# Patient Record
Sex: Female | Born: 1940 | Race: White | Hispanic: No | Smoking: Former smoker
Health system: Southern US, Community
[De-identification: ages and names within clinical notes are randomized; demographics above are authoritative.]

## PROBLEM LIST (undated history)

## (undated) DIAGNOSIS — G629 Polyneuropathy, unspecified: Secondary | ICD-10-CM

## (undated) DIAGNOSIS — F419 Anxiety disorder, unspecified: Secondary | ICD-10-CM

## (undated) DIAGNOSIS — F32A Depression, unspecified: Secondary | ICD-10-CM

## (undated) DIAGNOSIS — N189 Chronic kidney disease, unspecified: Secondary | ICD-10-CM

## (undated) DIAGNOSIS — K5090 Crohn's disease, unspecified, without complications: Secondary | ICD-10-CM

## (undated) DIAGNOSIS — C801 Malignant (primary) neoplasm, unspecified: Secondary | ICD-10-CM

## (undated) DIAGNOSIS — M1990 Unspecified osteoarthritis, unspecified site: Secondary | ICD-10-CM

## (undated) DIAGNOSIS — F329 Major depressive disorder, single episode, unspecified: Secondary | ICD-10-CM

## (undated) DIAGNOSIS — E785 Hyperlipidemia, unspecified: Secondary | ICD-10-CM

## (undated) DIAGNOSIS — K219 Gastro-esophageal reflux disease without esophagitis: Secondary | ICD-10-CM

## (undated) DIAGNOSIS — I83893 Varicose veins of bilateral lower extremities with other complications: Secondary | ICD-10-CM

## (undated) DIAGNOSIS — I350 Nonrheumatic aortic (valve) stenosis: Secondary | ICD-10-CM

## (undated) DIAGNOSIS — I10 Essential (primary) hypertension: Secondary | ICD-10-CM

## (undated) HISTORY — DX: Polyneuropathy, unspecified: G62.9

## (undated) HISTORY — DX: Hyperlipidemia, unspecified: E78.5

## (undated) HISTORY — PX: OTHER SURGICAL HISTORY: SHX169

## (undated) HISTORY — DX: Nonrheumatic aortic (valve) stenosis: I35.0

## (undated) HISTORY — PX: BOWEL RESECTION: SHX1257

## (undated) HISTORY — DX: Essential (primary) hypertension: I10

## (undated) HISTORY — DX: Chronic kidney disease, unspecified: N18.9

## (undated) HISTORY — PX: EYE SURGERY: SHX253

---

## 1968-02-25 HISTORY — PX: BOWEL RESECTION: SHX1257

## 2001-09-21 ENCOUNTER — Ambulatory Visit (HOSPITAL_COMMUNITY): Admission: RE | Admit: 2001-09-21 | Discharge: 2001-09-21 | Payer: Self-pay | Admitting: Ophthalmology

## 2001-11-02 ENCOUNTER — Ambulatory Visit (HOSPITAL_COMMUNITY): Admission: RE | Admit: 2001-11-02 | Discharge: 2001-11-02 | Payer: Self-pay | Admitting: Ophthalmology

## 2010-03-18 ENCOUNTER — Encounter: Payer: Self-pay | Admitting: Pulmonary Disease

## 2010-08-06 ENCOUNTER — Other Ambulatory Visit (HOSPITAL_COMMUNITY): Payer: Self-pay | Admitting: Pulmonary Disease

## 2010-08-06 DIAGNOSIS — M25562 Pain in left knee: Secondary | ICD-10-CM

## 2010-08-09 ENCOUNTER — Ambulatory Visit (HOSPITAL_COMMUNITY)
Admission: RE | Admit: 2010-08-09 | Discharge: 2010-08-09 | Disposition: A | Discharge status: Home / Self Care | Payer: Medicare Other | Source: Ambulatory Visit | Attending: Pulmonary Disease | Admitting: Pulmonary Disease

## 2010-08-09 DIAGNOSIS — M23349 Other meniscus derangements, anterior horn of lateral meniscus, unspecified knee: Secondary | ICD-10-CM | POA: Insufficient documentation

## 2010-08-09 DIAGNOSIS — M25562 Pain in left knee: Secondary | ICD-10-CM

## 2010-08-09 DIAGNOSIS — M25569 Pain in unspecified knee: Secondary | ICD-10-CM | POA: Insufficient documentation

## 2011-07-16 ENCOUNTER — Other Ambulatory Visit (HOSPITAL_COMMUNITY): Payer: Self-pay | Admitting: Pulmonary Disease

## 2011-07-16 DIAGNOSIS — Z139 Encounter for screening, unspecified: Secondary | ICD-10-CM

## 2011-07-22 ENCOUNTER — Ambulatory Visit (HOSPITAL_COMMUNITY)
Admission: RE | Admit: 2011-07-22 | Discharge: 2011-07-22 | Disposition: A | Discharge status: Home / Self Care | Payer: Medicare Other | Source: Ambulatory Visit | Attending: Pulmonary Disease | Admitting: Pulmonary Disease

## 2011-07-22 DIAGNOSIS — Z139 Encounter for screening, unspecified: Secondary | ICD-10-CM

## 2011-07-22 DIAGNOSIS — Z1231 Encounter for screening mammogram for malignant neoplasm of breast: Secondary | ICD-10-CM | POA: Insufficient documentation

## 2012-07-13 ENCOUNTER — Other Ambulatory Visit (HOSPITAL_COMMUNITY): Payer: Self-pay | Admitting: Pulmonary Disease

## 2012-07-13 DIAGNOSIS — M7989 Other specified soft tissue disorders: Secondary | ICD-10-CM

## 2012-07-15 ENCOUNTER — Ambulatory Visit (HOSPITAL_COMMUNITY)
Admission: RE | Admit: 2012-07-15 | Discharge: 2012-07-15 | Disposition: A | Discharge status: Home / Self Care | Payer: Medicare Other | Source: Ambulatory Visit | Attending: Pulmonary Disease | Admitting: Pulmonary Disease

## 2012-07-15 ENCOUNTER — Other Ambulatory Visit (HOSPITAL_COMMUNITY): Payer: Self-pay | Admitting: Pulmonary Disease

## 2012-07-15 DIAGNOSIS — M7989 Other specified soft tissue disorders: Secondary | ICD-10-CM | POA: Insufficient documentation

## 2013-07-05 ENCOUNTER — Other Ambulatory Visit (HOSPITAL_COMMUNITY): Payer: Self-pay | Admitting: Pulmonary Disease

## 2013-07-05 DIAGNOSIS — Z1231 Encounter for screening mammogram for malignant neoplasm of breast: Secondary | ICD-10-CM

## 2013-07-28 ENCOUNTER — Ambulatory Visit (HOSPITAL_COMMUNITY): Payer: Medicare Other

## 2013-08-02 ENCOUNTER — Ambulatory Visit (HOSPITAL_COMMUNITY): Payer: Medicare Other

## 2013-08-04 ENCOUNTER — Ambulatory Visit (HOSPITAL_COMMUNITY)
Admission: RE | Admit: 2013-08-04 | Discharge: 2013-08-04 | Disposition: A | Discharge status: Home / Self Care | Payer: Medicare Other | Source: Ambulatory Visit | Attending: Pulmonary Disease | Admitting: Pulmonary Disease

## 2013-08-04 DIAGNOSIS — Z1231 Encounter for screening mammogram for malignant neoplasm of breast: Secondary | ICD-10-CM

## 2013-08-16 ENCOUNTER — Other Ambulatory Visit (HOSPITAL_COMMUNITY): Payer: Self-pay | Admitting: Pulmonary Disease

## 2013-08-16 DIAGNOSIS — Z780 Asymptomatic menopausal state: Secondary | ICD-10-CM

## 2013-08-23 ENCOUNTER — Ambulatory Visit (HOSPITAL_COMMUNITY)
Admission: RE | Admit: 2013-08-23 | Discharge: 2013-08-23 | Disposition: A | Discharge status: Home / Self Care | Payer: Medicare Other | Source: Ambulatory Visit | Attending: Pulmonary Disease | Admitting: Pulmonary Disease

## 2013-08-23 DIAGNOSIS — Z780 Asymptomatic menopausal state: Secondary | ICD-10-CM | POA: Insufficient documentation

## 2013-11-03 ENCOUNTER — Telehealth: Payer: Self-pay | Admitting: Orthopedic Surgery

## 2013-11-03 ENCOUNTER — Ambulatory Visit (INDEPENDENT_AMBULATORY_CARE_PROVIDER_SITE_OTHER): Payer: Medicare Other | Admitting: Orthopedic Surgery

## 2013-11-03 VITALS — BP 161/86 | Ht 60.0 in | Wt 225.0 lb

## 2013-11-03 DIAGNOSIS — S92302A Fracture of unspecified metatarsal bone(s), left foot, initial encounter for closed fracture: Secondary | ICD-10-CM

## 2013-11-03 DIAGNOSIS — S92309A Fracture of unspecified metatarsal bone(s), unspecified foot, initial encounter for closed fracture: Secondary | ICD-10-CM

## 2013-11-03 NOTE — Telephone Encounter (Signed)
PATIENT ADVISED TO COME BY OFFICE TO GET ONE

## 2013-11-03 NOTE — Patient Instructions (Signed)
Short cam walker for 6 weeks  Wear when walking only

## 2013-11-03 NOTE — Telephone Encounter (Signed)
Patient called after leaving office with her brace for left foot fracture; relays that brace is sliding down.  Asking if she can get a "pump" for it?  Please advise.  Her ph# is [phone].

## 2013-11-05 DIAGNOSIS — S92309A Fracture of unspecified metatarsal bone(s), unspecified foot, initial encounter for closed fracture: Secondary | ICD-10-CM | POA: Insufficient documentation

## 2013-11-05 NOTE — Progress Notes (Signed)
New patient  Consult from Dr. Ladona Ridgel  Regular physician Dr. Juanetta Gosling.  Pharmacy [facility].  Problem pain left foot  Duration 10/26/2013 Injured yes September 2 Symptoms pain, swelling, stiffness, numbness tingling left foot Pain described as burning aching and radiating Severity 7/10 Previous testing x-rays at the chiropractor's office read as possible hairline fracture fourth metatarsal neck. Her medication Tylenol arthritis patient has difficulty taking oral analgesics such as narcotics  Improved with soaking and ice and Tylenol worse with ambulation for too long.  Symptoms review of systems review fatigue abdominal pain diarrhea loss of bladder control frequent urination temperature intolerance depression anxiety ankle swelling numbness tingling burning pain legs weakness food allergy joint pain limping stiff, swollen joints back pain  Medical history hypertension depression arthritis osteopenia Crohn's disease  Surgical history rectal surgery times 05/12/1968, 1971, 1972.  Medications A. sulfazine 500 Xanax 0.5 doxepin 50 multivitamin, vitamin C, vitamin D, super B complex and vitamin E  Allergies none  Family history diabetes pneumonia stroke depression thyroid disease arthritis cancer  Social history does not smoke drink or use street drugs   Vital signs are stable as recorded  General appearance is normal, body habitus obese BP 161/86  Ht 5' (1.524 m)  Wt 225 lb (102.059 kg)  BMI 43.94 kg/m2   The patient is alert and oriented x 3  The patient's mood and affect are normal  Gait assessment: Severe difficulty with ambulation regarding the left foot pain and decreased time in stance phase  The cardiovascular exam reveals normal pulses and temperature without edema or  swelling.  The lymphatic system is negative for palpable lymph nodes  The sensory exam is normal.  There are no pathologic reflexes.  Balance is normal.   Exam of the left foot is  swollen and tender diffusely no specific point tenderness although the fourth metatarsal seems more tender than the others. Foot deformities none. Range of motion at the ankle joint normal. Ankle stability normal. Muscle tone normal. No atrophy. Skin is red from inflammation but no cellulitis. A/P The x-ray is questionable to me the neck area may show a fracture but clinically she has enough pain to treat  Recommend short Cam Walker for 6 weeks

## 2013-12-15 ENCOUNTER — Ambulatory Visit (INDEPENDENT_AMBULATORY_CARE_PROVIDER_SITE_OTHER): Payer: Medicare Other

## 2013-12-15 ENCOUNTER — Ambulatory Visit (INDEPENDENT_AMBULATORY_CARE_PROVIDER_SITE_OTHER): Payer: Self-pay | Admitting: Orthopedic Surgery

## 2013-12-15 VITALS — BP 132/68 | Ht 60.0 in | Wt 225.0 lb

## 2013-12-15 DIAGNOSIS — S92902D Unspecified fracture of left foot, subsequent encounter for fracture with routine healing: Secondary | ICD-10-CM

## 2013-12-15 DIAGNOSIS — S92909A Unspecified fracture of unspecified foot, initial encounter for closed fracture: Secondary | ICD-10-CM | POA: Insufficient documentation

## 2013-12-15 NOTE — Progress Notes (Signed)
Chief Complaint  Patient presents with  . Follow-up    6 week recheck + xray Left foot fracture, DOI 10/26/13   Subjective:     Barbara Lucero is a 73 y.o. female presents for repeat examination and x-ray related to a left foot metatarsal fracture of the fourth metatarsal neck. Previous x-ray was questionable for fracture but patient had not tenderness and pain but I thought treatment was necessary,  We initiated short Cam Walker and she says her foot does not hurt while she is walking in a Cam Walker   Objective:    General:   alert and cooperative  Gait:    cane necessary for ambulation   Left Foot Circulation:   warm, well perfused distal to the injury  Skin:   normal                  Tenderness:    Point tenderness to the distal aspect of the fourth and fifth metatarsal aspect of the foot   Imaging X-rays were done in the office left foot 3 views, my interpretation is it is not definitive whether or not there is a fracture of the fourth metatarsal neck, I do see abnormality there however Assessment:     injury to the fourth metatarsal neck Plan:  Continue boot 2 more weeks then is okay to safely remove the boot    Medications A. sulfazine 500 Xanax 0.5 doxepin 50 multivitamin, vitamin C, vitamin D, super B complex and vitamin E

## 2014-08-09 ENCOUNTER — Emergency Department (HOSPITAL_COMMUNITY): Payer: Medicare Other

## 2014-08-09 ENCOUNTER — Emergency Department (HOSPITAL_COMMUNITY)
Admission: EM | Admit: 2014-08-09 | Discharge: 2014-08-09 | Disposition: A | Discharge status: Home / Self Care | Payer: Medicare Other | Attending: Emergency Medicine | Admitting: Emergency Medicine

## 2014-08-09 ENCOUNTER — Encounter (HOSPITAL_COMMUNITY): Payer: Self-pay | Admitting: Emergency Medicine

## 2014-08-09 DIAGNOSIS — Y9301 Activity, walking, marching and hiking: Secondary | ICD-10-CM | POA: Insufficient documentation

## 2014-08-09 DIAGNOSIS — K5090 Crohn's disease, unspecified, without complications: Secondary | ICD-10-CM | POA: Diagnosis not present

## 2014-08-09 DIAGNOSIS — S8991XA Unspecified injury of right lower leg, initial encounter: Secondary | ICD-10-CM | POA: Diagnosis not present

## 2014-08-09 DIAGNOSIS — Y998 Other external cause status: Secondary | ICD-10-CM | POA: Insufficient documentation

## 2014-08-09 DIAGNOSIS — S8392XA Sprain of unspecified site of left knee, initial encounter: Secondary | ICD-10-CM | POA: Diagnosis not present

## 2014-08-09 DIAGNOSIS — S86912A Strain of unspecified muscle(s) and tendon(s) at lower leg level, left leg, initial encounter: Secondary | ICD-10-CM | POA: Insufficient documentation

## 2014-08-09 DIAGNOSIS — Y9289 Other specified places as the place of occurrence of the external cause: Secondary | ICD-10-CM | POA: Diagnosis not present

## 2014-08-09 DIAGNOSIS — F419 Anxiety disorder, unspecified: Secondary | ICD-10-CM | POA: Insufficient documentation

## 2014-08-09 DIAGNOSIS — X58XXXA Exposure to other specified factors, initial encounter: Secondary | ICD-10-CM | POA: Insufficient documentation

## 2014-08-09 DIAGNOSIS — M1990 Unspecified osteoarthritis, unspecified site: Secondary | ICD-10-CM | POA: Diagnosis not present

## 2014-08-09 DIAGNOSIS — S8992XA Unspecified injury of left lower leg, initial encounter: Secondary | ICD-10-CM | POA: Diagnosis present

## 2014-08-09 DIAGNOSIS — M1711 Unilateral primary osteoarthritis, right knee: Secondary | ICD-10-CM | POA: Diagnosis not present

## 2014-08-09 DIAGNOSIS — Z79899 Other long term (current) drug therapy: Secondary | ICD-10-CM | POA: Diagnosis not present

## 2014-08-09 DIAGNOSIS — F329 Major depressive disorder, single episode, unspecified: Secondary | ICD-10-CM | POA: Insufficient documentation

## 2014-08-09 HISTORY — DX: Unspecified osteoarthritis, unspecified site: M19.90

## 2014-08-09 HISTORY — DX: Crohn's disease, unspecified, without complications: K50.90

## 2014-08-09 HISTORY — DX: Major depressive disorder, single episode, unspecified: F32.9

## 2014-08-09 HISTORY — DX: Depression, unspecified: F32.A

## 2014-08-09 HISTORY — DX: Anxiety disorder, unspecified: F41.9

## 2014-08-09 MED ORDER — METHYLPREDNISOLONE SODIUM SUCC 125 MG IJ SOLR
125.0000 mg | Freq: Once | INTRAMUSCULAR | Status: AC
  Administered 2014-08-09: 125 mg via INTRAMUSCULAR
  Filled 2014-08-09: qty 2

## 2014-08-09 MED ORDER — HYDROCODONE-ACETAMINOPHEN 5-325 MG PO TABS: 1.0000 | ORAL_TABLET | Freq: Four times a day (QID) | ORAL | Status: DC | PRN

## 2014-08-09 NOTE — ED Provider Notes (Signed)
CSN: [id number]     Arrival date & time 08/09/14  1734 History   First MD Initiated Contact with Patient 08/09/14 1749     Chief Complaint  Patient presents with  . Leg Pain     (Consider location/radiation/quality/duration/timing/severity/associated sxs/prior Treatment) Patient is a 74 y.o. female presenting with leg pain. The history is provided by the patient (pt was walking and had severe pain behind her right knee).  Leg Pain Location:  Leg Injury: yes   Mechanism of injury comment:  Walking Leg location:  R leg Pain details:    Severity:  Moderate   Onset quality:  Sudden Associated symptoms: no back pain and no fatigue     Past Medical History  Diagnosis Date  . Arthritis   . Anxiety   . Crohn disease   . Depression    Past Surgical History  Procedure Laterality Date  . Bowel resection     History reviewed. No pertinent family history. History  Substance Use Topics  . Smoking status: Never Smoker   . Smokeless tobacco: Not on file  . Alcohol Use: No   OB History    Gravida Para Term Preterm AB TAB SAB Ectopic Multiple Living            2     Review of Systems  Constitutional: Negative for appetite change and fatigue.  HENT: Negative for congestion, ear discharge and sinus pressure.   Eyes: Negative for discharge.  Respiratory: Negative for cough.   Cardiovascular: Negative for chest pain.  Gastrointestinal: Negative for abdominal pain and diarrhea.  Genitourinary: Negative for frequency and hematuria.  Musculoskeletal: Negative for back pain.       Leg pain  Skin: Negative for rash.  Neurological: Negative for seizures and headaches.  Psychiatric/Behavioral: Negative for hallucinations.      Allergies  Review of patient's allergies indicates no known allergies.  Home Medications   Prior to Admission medications   Medication Sig Start Date End Date Taking? Authorizing Provider  ALPRAZolam Prudy Feeler) 0.5 MG tablet Take 0.5 mg by mouth 4 (four)  times daily as needed for anxiety.   Yes Historical Provider, MD  Ascorbic Acid (VITAMIN C) 1000 MG tablet Take 1,000 mg by mouth daily.   Yes Historical Provider, MD  b complex vitamins tablet Take 1 tablet by mouth daily.   Yes Historical Provider, MD  Cholecalciferol (VITAMIN D) 2000 UNITS tablet Take 2,000 Units by mouth daily.   Yes Historical Provider, MD  doxepin (SINEQUAN) 50 MG capsule Take 50 mg by mouth at bedtime.    Yes Historical Provider, MD  Multiple Vitamin (MULTIVITAMIN) tablet Take 1 tablet by mouth daily.   Yes Historical Provider, MD  sulfaSALAzine (AZULFIDINE) 500 MG tablet Take 500 mg by mouth 4 (four) times daily.   Yes Historical Provider, MD  vitamin E 400 UNIT capsule Take 400 Units by mouth daily.   Yes Historical Provider, MD  HYDROcodone-acetaminophen (NORCO/VICODIN) 5-325 MG per tablet Take 1 tablet by mouth every 6 (six) hours as needed. 08/09/14   [name], MD   BP 137/58 mmHg  Pulse 73  Temp(Src) 97.5 F (36.4 C) (Oral)  Resp 16  Ht  (1.676 m)  Wt 210 lb (95.255 kg)  BMI 33.91 kg/m2  SpO2 92% Physical Exam  Constitutional: She is oriented to person, place, and time. She appears well-developed.  HENT:  Head: Normocephalic.  Eyes: Conjunctivae and EOM are normal. No scleral icterus.  Neck: Neck supple. No thyromegaly present.  Cardiovascular: Normal rate and regular rhythm.  Exam reveals no gallop and no friction rub.   No murmur heard. Pulmonary/Chest: No stridor. She has no wheezes. She has no rales. She exhibits no tenderness.  Abdominal: She exhibits no distension. There is no tenderness. There is no rebound.  Musculoskeletal: She exhibits no edema.  Tender post.  Right knee  Lymphadenopathy:    She has no cervical adenopathy.  Neurological: She is oriented to person, place, and time. She exhibits normal muscle tone. Coordination normal.  Skin: No rash noted. No erythema.  Psychiatric: She has a normal mood and affect. Her behavior is  normal.    ED Course  Procedures (including critical care time) Labs Review Labs Reviewed - No data to display  Imaging Review Dg Knee Complete 4 Views Right  08/09/2014   CLINICAL DATA:  Felt pop at the posterior aspect of the right knee while walking. Worsening lower extremity edema. Initial encounter.  EXAM: RIGHT KNEE - COMPLETE 4+ VIEW  COMPARISON:  None.  FINDINGS: There is no evidence of fracture or dislocation. The joint spaces are grossly preserved. Small marginal osteophytes are seen arising at all three compartments, and wall osteophytes and tibial spine osteophytes are also seen.  No significant joint effusion is seen. The visualized soft tissues are normal in appearance.  IMPRESSION: 1. No evidence of fracture or dislocation. 2. 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   Electronically Signed   By: Roanna Raider M.D.   On: 08/09/2014 18:32     EKG Interpretation None      MDM   Final diagnoses:  Knee sprain and strain, left, initial encounter    X-ray neg.  tx with vicodin,  Ace,  Rest and walker to ambulate    [name], MD 08/09/14 [phone]

## 2014-08-09 NOTE — Discharge Instructions (Signed)
Use a walker to ambulate.  Keep leg elevated.   Follow up with dr. Romeo Apple next week

## 2014-08-09 NOTE — ED Notes (Signed)
PT stated she was walking across the floor and felt a pop behind her right knee and c/o pain behind the knee radiating into her calf with peripheral edema bilaterally worsening the past week.

## 2014-08-21 ENCOUNTER — Encounter: Payer: Self-pay | Admitting: Orthopedic Surgery

## 2014-08-21 ENCOUNTER — Ambulatory Visit (INDEPENDENT_AMBULATORY_CARE_PROVIDER_SITE_OTHER): Payer: Medicare Other | Admitting: Orthopedic Surgery

## 2014-08-21 VITALS — BP 154/77 | Ht 60.0 in | Wt 210.0 lb

## 2014-08-21 DIAGNOSIS — M660 Rupture of popliteal cyst: Secondary | ICD-10-CM

## 2014-08-21 NOTE — Progress Notes (Signed)
Patient ID: Barbara Lucero, female   DOB: 1941-02-17, 74 y.o.   MRN: [id number]  Patient ID: Barbara Lucero, female   DOB: [birth year], 74 y.o.   MRN: [id number] New   Chief Complaint  Patient presents with  . Knee Pain    ER follow up from [facility] on right knee pain.     Barbara Lucero is a 74 y.o. female.   HPI 74 years old female walking no trauma felt something pop in the back of the knee then had acute pain went to the ER x-rays were negative except for arthritis. She was started rupture ligament. She was placed in an Ace wrap given an injection of cortisone. She had multiple cortisone injections before for knee pain and that seemed to do the trick. Comes in today improved with better range of motion and no mechanical symptoms  System review pertinent related range of motion in the joint is improved she has no warmth or swelling in the joint and no mechanical symptoms at this time Review of Systems See hpi  Past Medical History  Diagnosis Date  . Arthritis   . Anxiety   . Crohn disease   . Depression     Past Surgical History  Procedure Laterality Date  . Bowel resection      No family history on file.  Social History History  Substance Use Topics  . Smoking status: Never Smoker   . Smokeless tobacco: Not on file  . Alcohol Use: No    No Known Allergies  Current Outpatient Prescriptions  Medication Sig Dispense Refill  . ALPRAZolam (XANAX) 0.5 MG tablet Take 0.5 mg by mouth 4 (four) times daily as needed for anxiety.    . Ascorbic Acid (VITAMIN C) 1000 MG tablet Take 1,000 mg by mouth daily.    Marland Kitchen b complex vitamins tablet Take 1 tablet by mouth daily.    . Cholecalciferol (VITAMIN D) 2000 UNITS tablet Take 2,000 Units by mouth daily.    Marland Kitchen doxepin (SINEQUAN) 50 MG capsule Take 50 mg by mouth at bedtime.     Marland Kitchen HYDROcodone-acetaminophen (NORCO/VICODIN) 5-325 MG per tablet Take 1 tablet by mouth every 6 (six) hours as needed. 20 tablet 0  . Multiple Vitamin  (MULTIVITAMIN) tablet Take 1 tablet by mouth daily.    Marland Kitchen sulfaSALAzine (AZULFIDINE) 500 MG tablet Take 500 mg by mouth 4 (four) times daily.    . vitamin E 400 UNIT capsule Take 400 Units by mouth daily.     No current facility-administered medications for this visit.       Physical Exam Blood pressure 154/77, height 5' (1.524 m), weight 210 lb (95.255 kg). Physical Exam The patient is well developed well nourished and well groomed. Orientation to person place and time is normal  Mood is pleasant. Ambulatory status walking with a cane Ace bandage Mild tenderness medial lateral joint knee range of motion 125 flexionand tenderness except in the posterior aspect of the popliteal fossa and the knee joint. Knee is stable motor exam is normal neurovascular exam intact skin shows venous stasis changes bilaterally  X-rays  Data Reviewed X-rays x-rays show arthritis of the knee  Assessment Encounter Diagnosis  Name Primary?  . Ruptured Bakers cyst Yes    Plan Continue weightbearing as tolerated use ice as needed okay for her to come back here for injections if needed.

## 2014-09-25 DIAGNOSIS — M79606 Pain in leg, unspecified: Secondary | ICD-10-CM | POA: Diagnosis not present

## 2014-09-25 DIAGNOSIS — F419 Anxiety disorder, unspecified: Secondary | ICD-10-CM | POA: Diagnosis not present

## 2014-09-25 DIAGNOSIS — Z0000 Encounter for general adult medical examination without abnormal findings: Secondary | ICD-10-CM | POA: Diagnosis not present

## 2014-09-25 DIAGNOSIS — K5090 Crohn's disease, unspecified, without complications: Secondary | ICD-10-CM | POA: Diagnosis not present

## 2014-09-25 DIAGNOSIS — F329 Major depressive disorder, single episode, unspecified: Secondary | ICD-10-CM | POA: Diagnosis not present

## 2015-04-23 ENCOUNTER — Ambulatory Visit (INDEPENDENT_AMBULATORY_CARE_PROVIDER_SITE_OTHER): Payer: Medicare Other | Admitting: Obstetrics & Gynecology

## 2015-04-23 ENCOUNTER — Encounter: Payer: Self-pay | Admitting: Obstetrics & Gynecology

## 2015-04-23 VITALS — BP 160/90 | HR 76 | Wt 212.0 lb

## 2015-04-23 DIAGNOSIS — N813 Complete uterovaginal prolapse: Secondary | ICD-10-CM

## 2015-04-23 NOTE — Progress Notes (Signed)
Patient ID: Barbara Lucero, female   DOB: Nov 05, 1940, 75 y.o.   MRN: [id number]      Chief Complaint  Patient presents with  . new gyn visit    bladder drop    Blood pressure 160/90, pulse 76, weight 212 lb (96.163 kg).  75 y.o. No obstetric history on file. No LMP recorded. Patient is postmenopausal. The current method of family planning is post menopausal status.  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 gabapentin (NEURONTIN) 300 MG capsule    Sig: Take 300 mg by mouth 3 (three) times daily.    Labs or Scans Ordered: No orders of the defined types were placed in this encounter.    Management::   Follow up  4 days follow up         Face to face time:   minutes  Greater than 50% of the visit time was spent in counseling and coordination of care with the patient.  The summary and outline of the counseling and care coordination is summarized in the note above.   All questions were answered.  Past Medical History  Diagnosis Date  . Arthritis   . Anxiety   . Crohn disease (HCC)   . Depression     Past Surgical History  Procedure Laterality Date  . Bowel resection      OB History    Gravida Para Term Preterm AB TAB SAB Ectopic Multiple Living            2      No Known Allergies  Social History   Social History  . Marital Status: Married    Spouse Name: N/A  . Number of Children: N/A  . Years of  Education: N/A   Social History Main Topics  . Smoking status: Never Smoker   . Smokeless tobacco: None  . Alcohol Use: No  . Drug Use: No  . Sexual Activity: Not Asked   Other Topics Concern  . None   Social History Narrative    History reviewed. No pertinent family history.

## 2015-04-27 ENCOUNTER — Encounter: Payer: Self-pay | Admitting: Obstetrics & Gynecology

## 2015-04-27 ENCOUNTER — Ambulatory Visit (INDEPENDENT_AMBULATORY_CARE_PROVIDER_SITE_OTHER): Payer: Medicare Other | Admitting: Obstetrics & Gynecology

## 2015-04-27 VITALS — BP 140/90 | HR 74 | Wt 213.4 lb

## 2015-04-27 DIAGNOSIS — N813 Complete uterovaginal prolapse: Secondary | ICD-10-CM

## 2015-04-27 NOTE — Progress Notes (Signed)
Patient ID: Barbara Lucero, female   DOB: [birth year], 75 y.o.   MRN: [id number] Patient ID: Barbara Lucero, female   DOB: 21-Mar-1940, 75 y.o.   MRN: [id number]    Chief Complaint  Patient presents with  . gyn visit    insert pessary    Blood pressure 140/90, pulse 74, weight 213 lb 6.4 oz (96.798 kg).  Barbara Lucero presents today for routine follow up related to her pessary.   She was fit for Milex ring with support #5 She reports no vaginal discharge or vaginal bleeding.  Exam reveals no undue vaginal mucosal pressure of breakdown, no discharge and no vaginal bleeding.  The pessary is placed without difficulty.    Barbara Lucero will be sen back in 1 months for continued follow up.  Lazaro Arms, MD  04/27/2015 12:00 PM        Chief Complaint  Patient presents with  . gyn visit    insert pessary    Blood pressure 140/90, pulse 74, weight 213 lb 6.4 oz (96.798 kg).  75 y.o. No obstetric history on file. No LMP recorded. Patient is postmenopausal. The current method of family planning is post menopausal status.  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    Labs or Scans Ordered: No orders of the defined types were placed in this encounter.    Management::   Follow up  4 days follow up         Face to face time:   minutes  Greater than 50% of the visit time was spent in counseling and  coordination of care with the patient.  The summary and outline of the counseling and care coordination is summarized in the note above.   All questions were answered.  Past Medical History  Diagnosis Date  . Arthritis   . Anxiety   . Crohn disease (HCC)   . Depression     Past Surgical History  Procedure Laterality Date  . Bowel resection      OB History    Gravida Para Term Preterm AB TAB SAB Ectopic Multiple Living            2      No Known Allergies  Social History   Social History  . Marital Status: Married    Spouse Name: N/A  . Number of Children: N/A  . Years of Education: N/A   Social History Main Topics  . Smoking status: Never Smoker   . Smokeless tobacco: None  . Alcohol Use: No  . Drug Use: No  . Sexual Activity: Not Asked   Other Topics Concern  . None   Social History Narrative    History reviewed. No pertinent family history.

## 2015-05-25 ENCOUNTER — Ambulatory Visit: Payer: Self-pay | Admitting: Obstetrics & Gynecology

## 2015-05-31 ENCOUNTER — Ambulatory Visit: Payer: Self-pay | Admitting: Obstetrics & Gynecology

## 2015-06-05 ENCOUNTER — Ambulatory Visit: Payer: Self-pay | Admitting: Obstetrics & Gynecology

## 2015-06-11 ENCOUNTER — Encounter: Payer: Self-pay | Admitting: Obstetrics & Gynecology

## 2015-06-11 ENCOUNTER — Ambulatory Visit (INDEPENDENT_AMBULATORY_CARE_PROVIDER_SITE_OTHER): Payer: Medicare Other | Admitting: Obstetrics & Gynecology

## 2015-06-11 VITALS — BP 140/80 | HR 76 | Wt 219.0 lb

## 2015-06-11 DIAGNOSIS — N813 Complete uterovaginal prolapse: Secondary | ICD-10-CM

## 2015-06-11 NOTE — Progress Notes (Signed)
Patient ID: Barbara Lucero, female   DOB: [birth year], 75 y.o.   MRN: [id number] Chief Complaint  Patient presents with  . Follow-up    check/ clean pessary    Blood pressure 140/80, pulse 76, weight 219 lb (99.338 kg).  [name] presents today for routine follow up related to her pessary. She had complete procidentia   She uses a mile ring with support #5 She reports no vaginal discharge or vaginal bleeding.  Exam reveals no undue vaginal mucosal pressure of breakdown, no discharge and no vaginal bleeding.  The pessary is removed, cleaned and replaced without difficulty.    VERNELLE FLUET will be sen back in 3 months for continued follow up.  Lazaro Arms, MD  06/11/2015 3:59 PM

## 2015-08-08 ENCOUNTER — Other Ambulatory Visit (HOSPITAL_COMMUNITY): Payer: Self-pay | Admitting: Pulmonary Disease

## 2015-08-08 DIAGNOSIS — Z1231 Encounter for screening mammogram for malignant neoplasm of breast: Secondary | ICD-10-CM

## 2015-08-15 ENCOUNTER — Ambulatory Visit (HOSPITAL_COMMUNITY)
Admission: RE | Admit: 2015-08-15 | Discharge: 2015-08-15 | Disposition: A | Discharge status: Home / Self Care | Payer: Medicare Other | Source: Ambulatory Visit | Attending: Pulmonary Disease | Admitting: Pulmonary Disease

## 2015-08-15 DIAGNOSIS — Z1231 Encounter for screening mammogram for malignant neoplasm of breast: Secondary | ICD-10-CM

## 2015-09-10 ENCOUNTER — Ambulatory Visit: Payer: Self-pay | Admitting: Obstetrics & Gynecology

## 2015-09-17 ENCOUNTER — Encounter: Payer: Self-pay | Admitting: Obstetrics & Gynecology

## 2015-09-17 ENCOUNTER — Ambulatory Visit (INDEPENDENT_AMBULATORY_CARE_PROVIDER_SITE_OTHER): Payer: Medicare Other | Admitting: Obstetrics & Gynecology

## 2015-09-17 VITALS — BP 140/82 | HR 90 | Ht 66.0 in | Wt 213.0 lb

## 2015-09-17 DIAGNOSIS — N813 Complete uterovaginal prolapse: Secondary | ICD-10-CM

## 2015-09-17 NOTE — Progress Notes (Signed)
Chief Complaint  Patient presents with  . Pessary Check    Blood pressure 140/82, pulse 90, height 5\' 6"  (1.676 m), weight 213 lb (96.6 kg).  Barbara Lucero presents today for routine follow up related to her pessary.    She uses a milex ring with support #5, originally placed 04/27/2015.  She reports no vaginal discharge or vaginal bleeding.  Exam reveals no undue vaginal mucosal pressure of breakdown, no discharge and no vaginal bleeding.  The pessary is removed, cleaned and replaced without difficulty.    [name] will be sen back in 4 months for continued follow up.  Lazaro Arms, MD  09/17/2015 3:48 PM

## 2015-11-05 NOTE — Addendum Note (Signed)
Encounter addended by: Salena Saner on: 11/05/2015 11:02 AM<BR>    Actions taken: Imaging Exam begun

## 2016-01-21 ENCOUNTER — Ambulatory Visit: Payer: Self-pay | Admitting: Obstetrics & Gynecology

## 2016-02-07 ENCOUNTER — Ambulatory Visit: Payer: Self-pay | Admitting: Obstetrics & Gynecology

## 2016-02-12 ENCOUNTER — Ambulatory Visit (INDEPENDENT_AMBULATORY_CARE_PROVIDER_SITE_OTHER): Payer: Medicare Other | Admitting: Obstetrics & Gynecology

## 2016-02-12 ENCOUNTER — Encounter: Payer: Self-pay | Admitting: Obstetrics & Gynecology

## 2016-02-12 VITALS — BP 178/90 | HR 78 | Wt 219.0 lb

## 2016-02-12 DIAGNOSIS — R030 Elevated blood-pressure reading, without diagnosis of hypertension: Secondary | ICD-10-CM | POA: Diagnosis not present

## 2016-02-12 DIAGNOSIS — N813 Complete uterovaginal prolapse: Secondary | ICD-10-CM

## 2016-02-12 NOTE — Progress Notes (Signed)
Chief Complaint  Patient presents with  . Pessary Check    clean    Blood pressure (!) 178/90, pulse 78, weight 219 lb (99.3 kg).  [name] presents today for routine follow up related to her pessary.    She uses a milex ring with support #5, originally placed 04/27/2015.  She reports no vaginal discharge or vaginal bleeding.  Exam reveals no undue vaginal mucosal pressure of breakdown, no discharge and no vaginal bleeding.  The pessary is removed, cleaned and replaced without difficulty.    [name] will be sen back in 4 months for continued follow up.  Lazaro ArmsEURE,LUTHER H, MD  02/12/2016 4:48 PM

## 2016-06-05 ENCOUNTER — Inpatient Hospital Stay (HOSPITAL_COMMUNITY)
Admission: EM | Admit: 2016-06-05 | Discharge: 2016-06-09 | DRG: 470 | Disposition: A | Discharge status: Skilled Nursing Facility | Payer: Medicare Other | Attending: Pulmonary Disease | Admitting: Pulmonary Disease

## 2016-06-05 ENCOUNTER — Inpatient Hospital Stay (HOSPITAL_COMMUNITY): Payer: Medicare Other

## 2016-06-05 ENCOUNTER — Emergency Department (HOSPITAL_COMMUNITY): Payer: Medicare Other

## 2016-06-05 ENCOUNTER — Encounter (HOSPITAL_COMMUNITY): Payer: Self-pay

## 2016-06-05 DIAGNOSIS — S4992XA Unspecified injury of left shoulder and upper arm, initial encounter: Secondary | ICD-10-CM | POA: Diagnosis not present

## 2016-06-05 DIAGNOSIS — W010XXA Fall on same level from slipping, tripping and stumbling without subsequent striking against object, initial encounter: Secondary | ICD-10-CM | POA: Diagnosis present

## 2016-06-05 DIAGNOSIS — F32A Depression, unspecified: Secondary | ICD-10-CM | POA: Diagnosis present

## 2016-06-05 DIAGNOSIS — S99912A Unspecified injury of left ankle, initial encounter: Secondary | ICD-10-CM | POA: Diagnosis not present

## 2016-06-05 DIAGNOSIS — S72002A Fracture of unspecified part of neck of left femur, initial encounter for closed fracture: Secondary | ICD-10-CM | POA: Diagnosis not present

## 2016-06-05 DIAGNOSIS — S0990XA Unspecified injury of head, initial encounter: Secondary | ICD-10-CM | POA: Diagnosis not present

## 2016-06-05 DIAGNOSIS — Z96642 Presence of left artificial hip joint: Secondary | ICD-10-CM

## 2016-06-05 DIAGNOSIS — S299XXA Unspecified injury of thorax, initial encounter: Secondary | ICD-10-CM | POA: Diagnosis not present

## 2016-06-05 DIAGNOSIS — Z79899 Other long term (current) drug therapy: Secondary | ICD-10-CM | POA: Diagnosis not present

## 2016-06-05 DIAGNOSIS — K5090 Crohn's disease, unspecified, without complications: Secondary | ICD-10-CM | POA: Diagnosis present

## 2016-06-05 DIAGNOSIS — Y92009 Unspecified place in unspecified non-institutional (private) residence as the place of occurrence of the external cause: Secondary | ICD-10-CM

## 2016-06-05 DIAGNOSIS — R0902 Hypoxemia: Secondary | ICD-10-CM | POA: Diagnosis not present

## 2016-06-05 DIAGNOSIS — F329 Major depressive disorder, single episode, unspecified: Secondary | ICD-10-CM | POA: Diagnosis present

## 2016-06-05 DIAGNOSIS — M6281 Muscle weakness (generalized): Secondary | ICD-10-CM | POA: Diagnosis not present

## 2016-06-05 DIAGNOSIS — M25552 Pain in left hip: Secondary | ICD-10-CM | POA: Diagnosis present

## 2016-06-05 DIAGNOSIS — M1990 Unspecified osteoarthritis, unspecified site: Secondary | ICD-10-CM | POA: Diagnosis not present

## 2016-06-05 DIAGNOSIS — F411 Generalized anxiety disorder: Secondary | ICD-10-CM | POA: Diagnosis present

## 2016-06-05 DIAGNOSIS — Z6836 Body mass index (BMI) 36.0-36.9, adult: Secondary | ICD-10-CM

## 2016-06-05 DIAGNOSIS — Z8781 Personal history of (healed) traumatic fracture: Secondary | ICD-10-CM | POA: Diagnosis present

## 2016-06-05 DIAGNOSIS — Z9181 History of falling: Secondary | ICD-10-CM | POA: Diagnosis not present

## 2016-06-05 DIAGNOSIS — J9811 Atelectasis: Secondary | ICD-10-CM | POA: Diagnosis present

## 2016-06-05 DIAGNOSIS — Z0389 Encounter for observation for other suspected diseases and conditions ruled out: Secondary | ICD-10-CM | POA: Diagnosis not present

## 2016-06-05 DIAGNOSIS — R279 Unspecified lack of coordination: Secondary | ICD-10-CM | POA: Diagnosis not present

## 2016-06-05 DIAGNOSIS — R52 Pain, unspecified: Secondary | ICD-10-CM | POA: Diagnosis not present

## 2016-06-05 DIAGNOSIS — Z471 Aftercare following joint replacement surgery: Secondary | ICD-10-CM | POA: Diagnosis not present

## 2016-06-05 DIAGNOSIS — M79605 Pain in left leg: Secondary | ICD-10-CM | POA: Diagnosis not present

## 2016-06-05 DIAGNOSIS — R293 Abnormal posture: Secondary | ICD-10-CM | POA: Diagnosis not present

## 2016-06-05 DIAGNOSIS — W19XXXA Unspecified fall, initial encounter: Secondary | ICD-10-CM

## 2016-06-05 DIAGNOSIS — S8992XA Unspecified injury of left lower leg, initial encounter: Secondary | ICD-10-CM | POA: Diagnosis not present

## 2016-06-05 DIAGNOSIS — F419 Anxiety disorder, unspecified: Secondary | ICD-10-CM | POA: Diagnosis present

## 2016-06-05 DIAGNOSIS — S72091A Other fracture of head and neck of right femur, initial encounter for closed fracture: Secondary | ICD-10-CM | POA: Diagnosis not present

## 2016-06-05 LAB — CBC WITH DIFFERENTIAL/PLATELET
BASOS ABS: 0 10*3/uL (ref 0.0–0.1)
Basophils Relative: 0 %
EOS ABS: 0.1 10*3/uL (ref 0.0–0.7)
EOS PCT: 1 %
HCT: 42.1 % (ref 36.0–46.0)
HEMOGLOBIN: 13.5 g/dL (ref 12.0–15.0)
LYMPHS ABS: 0.9 10*3/uL (ref 0.7–4.0)
Lymphocytes Relative: 9 %
MCH: 29.3 pg (ref 26.0–34.0)
MCHC: 32.1 g/dL (ref 30.0–36.0)
MCV: 91.5 fL (ref 78.0–100.0)
Monocytes Absolute: 0.8 10*3/uL (ref 0.1–1.0)
Monocytes Relative: 8 %
NEUTROS PCT: 82 %
Neutro Abs: 8.9 10*3/uL — ABNORMAL HIGH (ref 1.7–7.7)
PLATELETS: 166 10*3/uL (ref 150–400)
RBC: 4.6 MIL/uL (ref 3.87–5.11)
RDW: 13.8 % (ref 11.5–15.5)
WBC: 10.7 10*3/uL — ABNORMAL HIGH (ref 4.0–10.5)

## 2016-06-05 LAB — BASIC METABOLIC PANEL
Anion gap: 6 (ref 5–15)
BUN: 13 mg/dL (ref 6–20)
CHLORIDE: 98 mmol/L — AB (ref 101–111)
CO2: 32 mmol/L (ref 22–32)
Calcium: 8.6 mg/dL — ABNORMAL LOW (ref 8.9–10.3)
Creatinine, Ser: 0.6 mg/dL (ref 0.44–1.00)
GFR calc Af Amer: >60 mL/min (ref >60)
Glucose, Bld: 166 mg/dL — ABNORMAL HIGH (ref 65–99)
POTASSIUM: 3.6 mmol/L (ref 3.5–5.1)
SODIUM: 136 mmol/L (ref 135–145)

## 2016-06-05 LAB — PREPARE RBC (CROSSMATCH)

## 2016-06-05 LAB — ABO/RH: ABO/RH(D): A NEG

## 2016-06-05 LAB — PROTIME-INR
INR: 0.99
Prothrombin Time: 13.1 seconds (ref 11.4–15.2)

## 2016-06-05 MED ORDER — FENTANYL CITRATE (PF) 100 MCG/2ML IJ SOLN
50.0000 ug | Freq: Once | INTRAMUSCULAR | Status: AC
  Administered 2016-06-05: 50 ug via INTRAVENOUS
  Filled 2016-06-05: qty 2

## 2016-06-05 MED ORDER — MORPHINE SULFATE (PF) 2 MG/ML IV SOLN
1.0000 mg | INTRAVENOUS | Status: DC | PRN
  Administered 2016-06-05 – 2016-06-09 (×14): 2 mg via INTRAVENOUS
  Filled 2016-06-05 (×15): qty 1

## 2016-06-05 MED ORDER — ONDANSETRON HCL 4 MG/2ML IJ SOLN
4.0000 mg | Freq: Once | INTRAMUSCULAR | Status: AC
  Administered 2016-06-05: 4 mg via INTRAVENOUS
  Filled 2016-06-05: qty 2

## 2016-06-05 MED ORDER — ONDANSETRON HCL 4 MG/2ML IJ SOLN
4.0000 mg | Freq: Four times a day (QID) | INTRAMUSCULAR | Status: DC | PRN
  Administered 2016-06-06: 4 mg via INTRAVENOUS

## 2016-06-05 MED ORDER — IOPAMIDOL (ISOVUE-300) INJECTION 61%
75.0000 mL | Freq: Once | INTRAVENOUS | Status: AC | PRN
  Administered 2016-06-05: 75 mL via INTRAVENOUS

## 2016-06-05 MED ORDER — ONDANSETRON HCL 4 MG PO TABS: 4.0000 mg | ORAL_TABLET | Freq: Four times a day (QID) | ORAL | Status: DC | PRN

## 2016-06-05 MED ORDER — DOXEPIN HCL 25 MG PO CAPS
50.0000 mg | ORAL_CAPSULE | Freq: Every day | ORAL | Status: DC
  Administered 2016-06-05 – 2016-06-08 (×4): 50 mg via ORAL
  Filled 2016-06-05 (×4): qty 2

## 2016-06-05 MED ORDER — FENTANYL CITRATE (PF) 100 MCG/2ML IJ SOLN
100.0000 ug | Freq: Once | INTRAMUSCULAR | Status: AC
  Administered 2016-06-05: 100 ug via INTRAVENOUS
  Filled 2016-06-05: qty 2

## 2016-06-05 MED ORDER — SULFASALAZINE 500 MG PO TABS
500.0000 mg | ORAL_TABLET | Freq: Two times a day (BID) | ORAL | Status: DC
  Administered 2016-06-05 – 2016-06-09 (×8): 500 mg via ORAL
  Filled 2016-06-05 (×13): qty 1

## 2016-06-05 MED ORDER — SODIUM CHLORIDE 0.9 % IV SOLN
INTRAVENOUS | Status: DC
  Administered 2016-06-05 – 2016-06-09 (×6): via INTRAVENOUS

## 2016-06-05 MED ORDER — SODIUM CHLORIDE 0.9 % IV SOLN: Freq: Once | INTRAVENOUS | Status: DC

## 2016-06-05 MED ORDER — ENOXAPARIN SODIUM 40 MG/0.4ML ~~LOC~~ SOLN
40.0000 mg | SUBCUTANEOUS | Status: DC
  Administered 2016-06-05: 40 mg via SUBCUTANEOUS
  Filled 2016-06-05: qty 0.4

## 2016-06-05 MED ORDER — PSYLLIUM 95 % PO PACK
1.0000 | PACK | Freq: Every day | ORAL | Status: DC
  Administered 2016-06-06 – 2016-06-08 (×3): 1 via ORAL
  Filled 2016-06-05 (×6): qty 1

## 2016-06-05 MED ORDER — ACETAMINOPHEN 325 MG PO TABS: 650.0000 mg | ORAL_TABLET | Freq: Three times a day (TID) | ORAL | Status: DC | PRN

## 2016-06-05 MED ORDER — GABAPENTIN 300 MG PO CAPS
300.0000 mg | ORAL_CAPSULE | Freq: Three times a day (TID) | ORAL | Status: DC | PRN
  Administered 2016-06-07: 300 mg via ORAL
  Filled 2016-06-05: qty 1

## 2016-06-05 NOTE — H&P (Addendum)
History and Physical    Barbara Lucero XHB:[id number] DOB: [birth year] DOA: 06/05/2016  PCP: Fredirick Maudlin, MD   I have personally briefly reviewed patient's old medical records in [facility]  Chief Complaint: hip pain.   HPI: Barbara Lucero is a 76 y.o. female with medical history significant of arthritis and crohn's disease presents with a fall, mechanical fall. and was found to have a hip fracture on the left. She reports pain in the left hip. On arrival to ED, she was found to be hypoxic and cxr does not show any acute pathology. I ordered CT chest, shows some basilar atelectasis. Lab work was unremarkable. She is referred to medical service for admission and orthopedics consulted. She denies any complaints other than left hip pain .     Review of Systems: As per HPI otherwise 10 point review of systems negative.    Past Medical History:  Diagnosis Date  . Anxiety   . Arthritis   . Crohn disease (HCC)   . Depression     Past Surgical History:  Procedure Laterality Date  . BOWEL RESECTION       reports that she has never smoked. She has never used smokeless tobacco. She reports that she does not drink alcohol or use drugs.  No Known Allergies  Family History  Problem Relation Age of Onset  . Breast cancer Sister   . Breast cancer Sister    Reviewed.   Prior to Admission medications   Medication Sig Start Date End Date Taking? Authorizing Provider  acetaminophen (TYLENOL) 650 MG CR tablet Take 650 mg by mouth every 8 (eight) hours as needed for pain.   Yes Historical Provider, MD  Ascorbic Acid (VITAMIN C) 1000 MG tablet Take 1,000 mg by mouth daily.   Yes Historical Provider, MD  b complex vitamins tablet Take 1 tablet by mouth daily.   Yes Historical Provider, MD  doxepin (SINEQUAN) 50 MG capsule Take 50 mg by mouth at bedtime.    Yes Historical Provider, MD  gabapentin (NEURONTIN) 300 MG capsule Take 300 mg by mouth 3 (three) times daily as needed (pain).     Yes Historical Provider, MD  ibuprofen (ADVIL,MOTRIN) 200 MG tablet Take 200 mg by mouth every 6 (six) hours as needed for moderate pain.   Yes Historical Provider, MD  Multiple Vitamin (MULTIVITAMIN) tablet Take 1 tablet by mouth daily.   Yes Historical Provider, MD  psyllium (METAMUCIL) 58.6 % packet Take 1 packet by mouth at bedtime.   Yes Historical Provider, MD  sulfaSALAzine (AZULFIDINE) 500 MG tablet Take 500 mg by mouth 2 (two) times daily.    Yes Historical Provider, MD  vitamin E 400 UNIT capsule Take 400 Units by mouth daily.   Yes Historical Provider, MD    Physical Exam: Vitals:   06/05/16 1232 06/05/16 1300 06/05/16 1315 06/05/16 1328  BP: (!) 145/64   (!) 154/70  Pulse: 80 81 88 87  Resp: 15 20 20 18   Temp:   98.6 F (37 C) 98.6 F (37 C)  TempSrc:   Oral Oral  SpO2: 100% 99% 99% 98%  Weight:      Height:        Constitutional: NAD, calm, comfortable Vitals:   06/05/16 1232 06/05/16 1300 06/05/16 1315 06/05/16 1328  BP: (!) 145/64   (!) 154/70  Pulse: 80 81 88 87  Resp: 15 20 20 18   Temp:   98.6 F (37 C) 98.6 F (37 C)  TempSrc:   Oral Oral  SpO2: 100% 99% 99% 98%  Weight:      Height:       Eyes: PERRL, lids and conjunctivae normal ENMT: Mucous membranes are moist. Posterior pharynx clear of any exudate or lesions.Normal dentition.  Neck: normal, supple, no masses, no thyromegaly Respiratory: clear to auscultation bilaterally, no wheezing, no crackles. Normal respiratory effort. No accessory muscle use.  Cardiovascular: Regular rate and rhythm, no murmurs / rubs / gallops. No extremity edema. 2+ pedal pulses. No carotid bruits.  Abdomen: no tenderness, no masses palpated. No hepatosplenomegaly. Bowel sounds positive.  Musculoskeletal: left leg internally rotated and short and pain ful movements of the left lower extremity.  Skin: no rashes, lesions, ulcers. No induration Neurologic: CN 2-12 grossly intact. Sensation intact, DTR normal. Strength 5/5 in all  4.  Psychiatric: Normal judgment and insight. Alert and oriented x 3. Normal mood.     Labs on Admission: I have personally reviewed following labs and imaging studies  CBC:  Recent Labs Lab 06/05/16 1012  WBC 10.7*  NEUTROABS 8.9*  HGB 13.5  HCT 42.1  MCV 91.5  PLT 166   Basic Metabolic Panel:  Recent Labs Lab 06/05/16 1012  NA 136  K 3.6  CL 98*  CO2 32  GLUCOSE 166*  BUN 13  CREATININE 0.60  CALCIUM 8.6*   GFR: Estimated Creatinine Clearance: 73.8 mL/min (by C-G formula based on SCr of 0.6 mg/dL). Liver Function Tests: No results for input(s): AST, ALT, ALKPHOS, BILITOT, PROT, ALBUMIN in the last 168 hours. No results for input(s): LIPASE, AMYLASE in the last 168 hours. No results for input(s): AMMONIA in the last 168 hours. Coagulation Profile:  Recent Labs Lab 06/05/16 1015  INR 0.99   Cardiac Enzymes: No results for input(s): CKTOTAL, CKMB, CKMBINDEX, TROPONINI in the last 168 hours. BNP (last 3 results) No results for input(s): PROBNP in the last 8760 hours. HbA1C: No results for input(s): HGBA1C in the last 72 hours. CBG: No results for input(s): GLUCAP in the last 168 hours. Lipid Profile: No results for input(s): CHOL, HDL, LDLCALC, TRIG, CHOLHDL, LDLDIRECT in the last 72 hours. Thyroid Function Tests: No results for input(s): TSH, T4TOTAL, FREET4, T3FREE, THYROIDAB in the last 72 hours. Anemia Panel: No results for input(s): VITAMINB12, FOLATE, FERRITIN, TIBC, IRON, RETICCTPCT in the last 72 hours. Urine analysis: No results found for: COLORURINE, APPEARANCEUR, LABSPEC, PHURINE, GLUCOSEU, HGBUR, BILIRUBINUR, KETONESUR, PROTEINUR, UROBILINOGEN, NITRITE, LEUKOCYTESUR  Radiological Exams on Admission: Dg Chest 1 View  Result Date: 06/05/2016 CLINICAL DATA:  76 year old female status post trip and fall in door way last night. Pain including left hip, and unable to use left leg this morning. EXAM: CHEST 1 VIEW COMPARISON:  Left shoulder series  today reported separately. FINDINGS: AP supine view of the chest at 0905 hours. Low normal lung volumes. Cardiac size at the upper limits of normal. Other mediastinal contours are within normal limits. Calcified aortic atherosclerosis. Visualized tracheal air column is within normal limits. Allowing for portable technique the lungs are clear. No acute osseous abnormality identified. IMPRESSION: 1. No acute cardiopulmonary abnormality. No acute traumatic injury identified to the chest. 2.  Calcified aortic atherosclerosis. Electronically Signed   By: Odessa Fleming M.D.   On: 06/05/2016 09:58   Dg Knee 2 Views Left  Result Date: 06/05/2016 CLINICAL DATA:  76 year old female status post trip and fall in door way last night. Pain including left hip, and unable to use left leg this morning. EXAM: LEFT  KNEE - 1-2 VIEW COMPARISON:  Left knee MRI 08/09/2010 FINDINGS: Cross-table AP and lateral views of the left knee. The lateral view is oblique. Osteopenia. Tricompartmental joint space loss and degenerative spurring. No definite joint effusion. The patella appears intact. No acute osseous abnormality identified. IMPRESSION: Tricompartmental degenerative changes. No acute fracture or dislocation identified about the left knee. Electronically Signed   By: Odessa Fleming M.D.   On: 06/05/2016 09:53   Dg Ankle Complete Left  Result Date: 06/05/2016 CLINICAL DATA:  76 year old female status post trip and fall in door way last night. Pain including left hip, and unable to use left leg this morning. EXAM: LEFT ANKLE COMPLETE - 3+ VIEW COMPARISON:  None. FINDINGS: 3 cross table views of the left ankle. Osteopenia. Mortise joint alignment preserved. Talar dome intact. No ankle joint effusion identified. Calcaneus appears intact. No fracture of the distal tibia or fibula identified. Visible left foot osseous structures also appear intact. Vascular calcifications about the left tib-fib, likely in part phleboliths. IMPRESSION: Osteopenia.  No acute fracture or dislocation identified about the left ankle. Electronically Signed   By: Odessa Fleming M.D.   On: 06/05/2016 09:56   Ct Head Wo Contrast  Result Date: 06/05/2016 CLINICAL DATA:  Fall last night with head injury. Initial encounter. EXAM: CT HEAD WITHOUT CONTRAST TECHNIQUE: Contiguous axial images were obtained from the base of the skull through the vertex without intravenous contrast. COMPARISON:  None. FINDINGS: Brain: No evidence of acute infarction, hemorrhage, hydrocephalus, extra-axial collection or mass lesion/mass effect. 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 Vascular: Atherosclerotic calcification.  The vessel hyperdensity. Skull: Possible mild scalp injury near the vertex. Negative for fracture. Sinuses/Orbits: Bilateral cataract resection. No posttraumatic finding. IMPRESSION: No evidence of intracranial injury or fracture. Electronically Signed   By: Marnee Spring M.D.   On: 06/05/2016 10:10   Ct Chest W Contrast  Result Date: 06/05/2016 CLINICAL DATA:  Short of breath.  Hip fracture EXAM: CT CHEST WITH CONTRAST TECHNIQUE: Multidetector CT imaging of the chest was performed during intravenous contrast administration. CONTRAST:  75mL ISOVUE-300 IOPAMIDOL (ISOVUE-300) INJECTION 61% COMPARISON:  Chest x-ray 06/05/2016 FINDINGS: Cardiovascular: Routine chest CT with contrast was ordered. CT technique not performed. There is limited ability to diagnose pulmonary emboli on this study. No large central emboli identified. Atherosclerotic aortic arch without aneurysm or dissection. Extensive coronary calcification. Heart size upper normal. Mediastinum/Nodes: Negative Lungs/Pleura: Mild dependent atelectasis in the lung bases. Negative for pneumonia. Negative for mass or effusion. Upper Abdomen: Negative Musculoskeletal: Negative IMPRESSION: Extensive coronary calcification. Mild bibasilar atelectasis. No acute abnormalities chest CTA technique was not ordered for this study and  there is limited ability to diagnose pulmonary emboli due to timing of contrast. Electronically Signed   By: Marlan Palau M.D.   On: 06/05/2016 13:36   Dg Shoulder Left  Result Date: 06/05/2016 CLINICAL DATA:  76 year old female status post trip and fall in door way last night. Pain including left hip, and unable to use left leg this morning. EXAM: LEFT SHOULDER - 2+ VIEW COMPARISON:  None. FINDINGS: Two views of the left shoulder. No glenohumeral joint dislocation. Bone mineralization is within normal limits for age. Proximal left humerus appears intact. Visible left clavicle and scapula appear intact. Visible left ribs and lung parenchyma are within normal limits. IMPRESSION: No acute fracture or dislocation identified about the left shoulder. Electronically Signed   By: Odessa Fleming M.D.   On: 06/05/2016 09:57   Dg Hip Unilat W Or Wo  Pelvis 2-3 Views Left  Result Date: 06/05/2016 CLINICAL DATA:  76 year old female status post trip and fall in door way last night. Pain including left hip, and unable to use left leg this morning. EXAM: DG HIP (WITH OR WITHOUT PELVIS) 2-3V LEFT COMPARISON:  None. FINDINGS: Left femoral neck fracture with varus impaction. The left femoral head remains normally located. The intertrochanteric segment of the proximal left femur appears to remain intact. Right femoral head normally located. Grossly intact proximal right femur. No superimposed pelvic fracture identified. Sacral ala and SI joints appear within normal limits. Probable pessary projects over the central pelvis. IMPRESSION: 1. Left femoral neck fracture with varus impaction. 2. No other acute fracture or dislocation identified about the pelvis. Electronically Signed   By: Odessa Fleming M.D.   On: 06/05/2016 09:52    EKG: Independently reviewed. Sinus with non specific t wave abnormalities.  Assessment/Plan Active Problems:   Hip fracture (HCC)    Left hip fracture:  From mechanical fall.  Orthopedics consulted by  EDP.  She is  Mild to moderate risk for cardiac complications in view of her age and co morbidities.  Pain control and surgical repair as per orthopedics.    Hypoxia: suspect from the fentanyl given in ED for pain. CXR and CT chest does not show acute pathology.    Crohn's disease:  Resume home meds.   DVT prophylaxis: lovenox.  Code Status: full code.  Family Communication:family at bedside.  Disposition Plan: pending surgical repair.  Consults called: orthopedics.  Admission status: *inpatient tele.    Kathlen Mody MD [facility] Pager [phone]  If 7PM-7AM, please contact night-coverage [email or web address] Password [email or web address]  06/05/2016, 2:02 PM

## 2016-06-05 NOTE — ED Triage Notes (Addendum)
Pt reports that she stumbled over a gate in a doorway last night. Complaining of left hip, knee and foot. Left leg noted to be shorter than right and internally rotated Reports unable to use leg this morning and heard/pop when she bent to get on toilet

## 2016-06-05 NOTE — ED Notes (Signed)
Pt went for chest CT

## 2016-06-05 NOTE — ED Provider Notes (Signed)
[facility] DEPT Provider Note   CSN: [id number] Arrival date & time: 06/05/16  8413     History   Chief Complaint Chief Complaint  Patient presents with  . Hip Pain    HPI Barbara Lucero is a 76 y.o. female with past medical history as outlined below presenting with fall which occurred at midnight last night.  She tripped and fell in her home, landing on her left side, hitting her head but denies loc, mostly with complaint of left hip, knee and ankle pain.  She was able to get up after the fall with assistance but had severe pain when she got out of bed this am.  She felt a popping sensation when she tried to sit on the toilet.  She last took tylenol arthritis strength and an advil at the time of the fall.  She denies dizziness, n/v chest pain or abdominal pain, also denies headache or neck pain.  She denies numbness in her extremities.  The history is provided by the patient.    Past Medical History:  Diagnosis Date  . Anxiety   . Arthritis   . Crohn disease (HCC)   . Depression     Patient Active Problem List   Diagnosis Date Noted  . 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 Foot fracture 12/15/2013  . Metatarsal fracture 11/05/2013    Past Surgical History:  Procedure Laterality Date  . BOWEL RESECTION      OB History    Gravida Para Term Preterm AB Living             2   SAB TAB Ectopic Multiple Live Births                   Home Medications    Prior to Admission medications   Medication Sig Start Date End Date Taking? Authorizing Provider  acetaminophen (TYLENOL) 650 MG CR tablet Take 650 mg by mouth every 8 (eight) hours as needed for pain.   Yes Historical Provider, MD  Ascorbic Acid (VITAMIN C) 1000 MG tablet Take 1,000 mg by mouth daily.   Yes Historical Provider, MD  b complex vitamins tablet Take 1 tablet by mouth daily.   Yes Historical Provider, MD  doxepin (SINEQUAN) 50 MG capsule Take 50 mg by mouth at bedtime.    Yes Historical Provider, MD    gabapentin (NEURONTIN) 300 MG capsule Take 300 mg by mouth 3 (three) times daily as needed (pain).    Yes Historical Provider, MD  ibuprofen (ADVIL,MOTRIN) 200 MG tablet Take 200 mg by mouth every 6 (six) hours as needed for moderate pain.   Yes Historical Provider, MD  Multiple Vitamin (MULTIVITAMIN) tablet Take 1 tablet by mouth daily.   Yes Historical Provider, MD  psyllium (METAMUCIL) 58.6 % packet Take 1 packet by mouth at bedtime.   Yes Historical Provider, MD  sulfaSALAzine (AZULFIDINE) 500 MG tablet Take 500 mg by mouth 2 (two) times daily.    Yes Historical Provider, MD  vitamin E 400 UNIT capsule Take 400 Units by mouth daily.   Yes Historical Provider, MD    Family History Family History  Problem Relation Age of Onset  . Breast cancer Sister   . Breast cancer Sister     Social History Social History  Substance Use Topics  . Smoking status: Never Smoker  . Smokeless tobacco: Never Used  . Alcohol use No     Allergies   Patient has no known allergies.   Review of  Systems Review of Systems  Constitutional: Negative for fever.  HENT: Negative.   Respiratory: Negative for shortness of breath.   Cardiovascular: Negative for chest pain.  Gastrointestinal: Negative for abdominal pain, nausea and vomiting.  Musculoskeletal: Positive for arthralgias and joint swelling. Negative for back pain, myalgias and neck pain.  Neurological: Negative for dizziness, weakness, numbness and headaches.     Physical Exam Updated Vital Signs BP (!) 158/79 (BP Location: Right Arm)   Pulse 86   Temp 98.1 F (36.7 C)   Resp 16   Ht 5\' 6"  (1.676 m)   Wt 97.5 kg   SpO2 100%   BMI 34.70 kg/m   Physical Exam  Constitutional: She appears well-developed and well-nourished.  HENT:  Head: Normocephalic and atraumatic.  No scalp or face hematoma or other sign of trauma.  Eyes: Conjunctivae and EOM are normal. Pupils are equal, round, and reactive to light.  Neck: Normal range of  motion. Neck supple.  Cardiovascular: Normal rate, regular rhythm, normal heart sounds and intact distal pulses.   Pulses:      Dorsalis pedis pulses are 2+ on the right side, and 2+ on the left side.  Pulmonary/Chest: Effort normal and breath sounds normal. No respiratory distress. She has no wheezes. She has no rales. She exhibits no tenderness.  Pt oxygen sats remaining around 88%, denies sob.  Abdominal: Soft. Bowel sounds are normal. She exhibits no distension. There is no tenderness. There is no guarding.  Musculoskeletal: Normal range of motion.       Left hip: She exhibits bony tenderness.       Left knee: She exhibits swelling. She exhibits no deformity, no erythema and normal alignment. Tenderness found. Medial joint line and lateral joint line tenderness noted.       Left ankle: She exhibits swelling. Tenderness. Lateral malleolus tenderness found. Achilles tendon normal.  Left leg shortened and internally rotated.  Neurological: She is alert.  Sensation intact in all extremities. Moving upper arms, right leg without discomfort.  Skin: Skin is warm and dry.  Mild erythema noted bilateral lower legs.  Advanced spider angioma's, varicosities.   Ecchymosis noted left lateral upper arm.  Psychiatric: She has a normal mood and affect.  Nursing note and vitals reviewed.    ED Treatments / Results  Labs (all labs ordered are listed, but only abnormal results are displayed) Labs Reviewed  CBC WITH DIFFERENTIAL/PLATELET - Abnormal; Notable for the following:       Result Value   WBC 10.7 (*)    Neutro Abs 8.9 (*)    All other components within normal limits  BASIC METABOLIC PANEL - Abnormal; Notable for the following:    Chloride 98 (*)    Glucose, Bld 166 (*)    Calcium 8.6 (*)    All other components within normal limits  URINE CULTURE  PROTIME-INR  TYPE AND SCREEN    EKG  EKG Interpretation None       Radiology Dg Chest 1 View  Result Date: 06/05/2016 CLINICAL  DATA:  76 year old female status post trip and fall in door way last night. Pain including left hip, and unable to use left leg this morning. EXAM: CHEST 1 VIEW COMPARISON:  Left shoulder series today reported separately. FINDINGS: AP supine view of the chest at 0905 hours. Low normal lung volumes. Cardiac size at the upper limits of normal. Other mediastinal contours are within normal limits. Calcified aortic atherosclerosis. Visualized tracheal air column is within normal limits. Allowing  for portable technique the lungs are clear. No acute osseous abnormality identified. IMPRESSION: 1. No acute cardiopulmonary abnormality. No acute traumatic injury identified to the chest. 2.  Calcified aortic atherosclerosis. Electronically Signed   By: Odessa Fleming M.D.   On: 06/05/2016 09:58   Dg Knee 2 Views Left  Result Date: 06/05/2016 CLINICAL DATA:  76 year old female status post trip and fall in door way last night. Pain including left hip, and unable to use left leg this morning. EXAM: LEFT KNEE - 1-2 VIEW COMPARISON:  Left knee MRI 08/09/2010 FINDINGS: Cross-table AP and lateral views of the left knee. The lateral view is oblique. Osteopenia. Tricompartmental joint space loss and degenerative spurring. No definite joint effusion. The patella appears intact. No acute osseous abnormality identified. IMPRESSION: Tricompartmental degenerative changes. No acute fracture or dislocation identified about the left knee. Electronically Signed   By: Odessa Fleming M.D.   On: 06/05/2016 09:53   Dg Ankle Complete Left  Result Date: 06/05/2016 CLINICAL DATA:  76 year old female status post trip and fall in door way last night. Pain including left hip, and unable to use left leg this morning. EXAM: LEFT ANKLE COMPLETE - 3+ VIEW COMPARISON:  None. FINDINGS: 3 cross table views of the left ankle. Osteopenia. Mortise joint alignment preserved. Talar dome intact. No ankle joint effusion identified. Calcaneus appears intact. No fracture of  the distal tibia or fibula identified. Visible left foot osseous structures also appear intact. Vascular calcifications about the left tib-fib, likely in part phleboliths. IMPRESSION: Osteopenia. No acute fracture or dislocation identified about the left ankle. Electronically Signed   By: Odessa Fleming M.D.   On: 06/05/2016 09:56   Ct Head Wo Contrast  Result Date: 06/05/2016 CLINICAL DATA:  Fall last night with head injury. Initial encounter. EXAM: CT HEAD WITHOUT CONTRAST TECHNIQUE: Contiguous axial images were obtained from the base of the skull through the vertex without intravenous contrast. COMPARISON:  None. FINDINGS: Brain: No evidence of acute infarction, hemorrhage, hydrocephalus, extra-axial collection or mass lesion/mass effect. 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 Vascular: Atherosclerotic calcification.  The vessel hyperdensity. Skull: Possible mild scalp injury near the vertex. Negative for fracture. Sinuses/Orbits: Bilateral cataract resection. No posttraumatic finding. IMPRESSION: No evidence of intracranial injury or fracture. Electronically Signed   By: Marnee Spring M.D.   On: 06/05/2016 10:10   Dg Shoulder Left  Result Date: 06/05/2016 CLINICAL DATA:  76 year old female status post trip and fall in door way last night. Pain including left hip, and unable to use left leg this morning. EXAM: LEFT SHOULDER - 2+ VIEW COMPARISON:  None. FINDINGS: Two views of the left shoulder. No glenohumeral joint dislocation. Bone mineralization is within normal limits for age. Proximal left humerus appears intact. Visible left clavicle and scapula appear intact. Visible left ribs and lung parenchyma are within normal limits. IMPRESSION: No acute fracture or dislocation identified about the left shoulder. Electronically Signed   By: Odessa Fleming M.D.   On: 06/05/2016 09:57   Dg Hip Unilat W Or Wo Pelvis 2-3 Views Left  Result Date: 06/05/2016 CLINICAL DATA:  76 year old female status post trip and  fall in door way last night. Pain including left hip, and unable to use left leg this morning. EXAM: DG HIP (WITH OR WITHOUT PELVIS) 2-3V LEFT COMPARISON:  None. FINDINGS: Left femoral neck fracture with varus impaction. The left femoral head remains normally located. The intertrochanteric segment of the proximal left femur appears to remain intact. Right femoral head normally  located. Grossly intact proximal right femur. No superimposed pelvic fracture identified. Sacral ala and SI joints appear within normal limits. Probable pessary projects over the central pelvis. IMPRESSION: 1. Left femoral neck fracture with varus impaction. 2. No other acute fracture or dislocation identified about the pelvis. Electronically Signed   By: Odessa Fleming M.D.   On: 06/05/2016 09:52    Procedures Procedures (including critical care time)  Medications Ordered in ED Medications  fentaNYL (SUBLIMAZE) injection 50 mcg (50 mcg Intravenous Given 06/05/16 0903)  ondansetron (ZOFRAN) injection 4 mg (4 mg Intravenous Given 06/05/16 0903)  fentaNYL (SUBLIMAZE) injection 100 mcg (100 mcg Intravenous Given 06/05/16 1023)     Initial Impression / Assessment and Plan / ED Course  I have reviewed the triage vital signs and the nursing notes.  Pertinent labs & imaging results that were available during my care of the patient were reviewed by me and considered in my medical decision making (see chart for details).     Call placed to Dr. Romeo Apple who is aware of patient.  He requests medical admission and states hypoxia will need to be addressed before she will be a suitable surgical candidate.  Discussed with Dr. Blake Divine who accepts pt for admission.  Final Clinical Impressions(s) / ED Diagnoses   Final diagnoses:  Closed fracture of left hip, initial encounter [facility])    New Prescriptions New Prescriptions   No medications on file     Burgess Amor, Cordelia Poche 06/05/16 1201    [name], MD 06/05/16 1521

## 2016-06-05 NOTE — ED Notes (Signed)
Report given to [facility], RN for room 311.

## 2016-06-06 ENCOUNTER — Encounter (HOSPITAL_COMMUNITY): Payer: Self-pay | Admitting: *Deleted

## 2016-06-06 ENCOUNTER — Encounter (HOSPITAL_COMMUNITY)
Admission: EM | Disposition: A | Discharge status: Skilled Nursing Facility | Payer: Self-pay | Source: Home / Self Care | Attending: Pulmonary Disease

## 2016-06-06 ENCOUNTER — Inpatient Hospital Stay (HOSPITAL_COMMUNITY): Payer: Medicare Other | Admitting: Anesthesiology

## 2016-06-06 ENCOUNTER — Inpatient Hospital Stay (HOSPITAL_COMMUNITY): Payer: Medicare Other

## 2016-06-06 HISTORY — PX: HIP ARTHROPLASTY: SHX981

## 2016-06-06 LAB — GLUCOSE, CAPILLARY
GLUCOSE-CAPILLARY: 125 mg/dL — AB (ref 65–99)
Glucose-Capillary: 124 mg/dL — ABNORMAL HIGH (ref 65–99)

## 2016-06-06 LAB — SURGICAL PCR SCREEN
MRSA, PCR: NEGATIVE
Staphylococcus aureus: NEGATIVE

## 2016-06-06 SURGERY — HEMIARTHROPLASTY, HIP, DIRECT ANTERIOR APPROACH, FOR FRACTURE
Anesthesia: Spinal | Site: Hip | Laterality: Left

## 2016-06-06 MED ORDER — FENTANYL CITRATE (PF) 100 MCG/2ML IJ SOLN
INTRAMUSCULAR | Status: DC | PRN
  Administered 2016-06-06: 25 ug via INTRAVENOUS
  Administered 2016-06-06: 25 ug via INTRATHECAL

## 2016-06-06 MED ORDER — EPINEPHRINE PF 1 MG/10ML IJ SOSY
PREFILLED_SYRINGE | INTRAMUSCULAR | Status: DC | PRN
  Administered 2016-06-06: .1 mL via INTRAVENOUS

## 2016-06-06 MED ORDER — FENTANYL CITRATE (PF) 100 MCG/2ML IJ SOLN
INTRAMUSCULAR | Status: AC
  Filled 2016-06-06: qty 2

## 2016-06-06 MED ORDER — CEFAZOLIN SODIUM-DEXTROSE 2-4 GM/100ML-% IV SOLN
2.0000 g | Freq: Four times a day (QID) | INTRAVENOUS | Status: AC
  Administered 2016-06-07: 2 g via INTRAVENOUS
  Filled 2016-06-06 (×2): qty 100

## 2016-06-06 MED ORDER — PHENYLEPHRINE 40 MCG/ML (10ML) SYRINGE FOR IV PUSH (FOR BLOOD PRESSURE SUPPORT)
PREFILLED_SYRINGE | INTRAVENOUS | Status: AC
  Filled 2016-06-06: qty 10

## 2016-06-06 MED ORDER — ACETAMINOPHEN 650 MG RE SUPP: 650.0000 mg | Freq: Four times a day (QID) | RECTAL | Status: DC | PRN

## 2016-06-06 MED ORDER — 0.9 % SODIUM CHLORIDE (POUR BTL) OPTIME
TOPICAL | Status: DC | PRN
  Administered 2016-06-06: 1000 mL

## 2016-06-06 MED ORDER — CEFAZOLIN SODIUM-DEXTROSE 2-4 GM/100ML-% IV SOLN
2.0000 g | INTRAVENOUS | Status: DC
  Filled 2016-06-06: qty 100

## 2016-06-06 MED ORDER — MIDAZOLAM HCL 2 MG/2ML IJ SOLN
1.0000 mg | INTRAMUSCULAR | Status: DC
  Administered 2016-06-06: 2 mg via INTRAVENOUS

## 2016-06-06 MED ORDER — PROPOFOL 10 MG/ML IV BOLUS
INTRAVENOUS | Status: AC
  Filled 2016-06-06: qty 20

## 2016-06-06 MED ORDER — PROPOFOL 10 MG/ML IV BOLUS
INTRAVENOUS | Status: DC | PRN
  Administered 2016-06-06 (×3): 10 mg via INTRAVENOUS

## 2016-06-06 MED ORDER — ONDANSETRON HCL 4 MG/2ML IJ SOLN
4.0000 mg | Freq: Four times a day (QID) | INTRAMUSCULAR | Status: DC | PRN
  Filled 2016-06-06: qty 2

## 2016-06-06 MED ORDER — SODIUM CHLORIDE 0.9 % IR SOLN
Status: DC | PRN
  Administered 2016-06-06: 3000 mL

## 2016-06-06 MED ORDER — POVIDONE-IODINE 10 % EX SWAB: 2.0000 "application " | Freq: Once | CUTANEOUS | Status: DC

## 2016-06-06 MED ORDER — EPHEDRINE SULFATE 50 MG/ML IJ SOLN
INTRAMUSCULAR | Status: DC | PRN
  Administered 2016-06-06 (×4): 10 mg via INTRAVENOUS

## 2016-06-06 MED ORDER — ACETAMINOPHEN 325 MG PO TABS: 650.0000 mg | ORAL_TABLET | Freq: Four times a day (QID) | ORAL | Status: DC | PRN

## 2016-06-06 MED ORDER — LACTATED RINGERS IV SOLN
INTRAVENOUS | Status: DC
  Administered 2016-06-06 (×2): via INTRAVENOUS

## 2016-06-06 MED ORDER — ASPIRIN EC 325 MG PO TBEC
325.0000 mg | DELAYED_RELEASE_TABLET | Freq: Every day | ORAL | Status: DC
  Administered 2016-06-07 – 2016-06-09 (×3): 325 mg via ORAL
  Filled 2016-06-06 (×3): qty 1

## 2016-06-06 MED ORDER — CHLORHEXIDINE GLUCONATE 4 % EX LIQD
60.0000 mL | Freq: Once | CUTANEOUS | Status: AC
  Administered 2016-06-06: 4 via TOPICAL
  Filled 2016-06-06: qty 15

## 2016-06-06 MED ORDER — PHENYLEPHRINE HCL 10 MG/ML IJ SOLN
INTRAMUSCULAR | Status: DC | PRN
  Administered 2016-06-06 (×5): 80 ug via INTRAVENOUS

## 2016-06-06 MED ORDER — HYDROMORPHONE HCL 1 MG/ML IJ SOLN: 0.2500 mg | INTRAMUSCULAR | Status: DC | PRN

## 2016-06-06 MED ORDER — MENTHOL 3 MG MT LOZG: 1.0000 | LOZENGE | OROMUCOSAL | Status: DC | PRN

## 2016-06-06 MED ORDER — BUPIVACAINE-EPINEPHRINE (PF) 0.5% -1:200000 IJ SOLN
INTRAMUSCULAR | Status: DC | PRN
  Administered 2016-06-06: 60 mL via PERINEURAL

## 2016-06-06 MED ORDER — BUPIVACAINE IN DEXTROSE 0.75-8.25 % IT SOLN
INTRATHECAL | Status: DC | PRN
  Administered 2016-06-06: 2 mL via INTRATHECAL

## 2016-06-06 MED ORDER — METOCLOPRAMIDE HCL 10 MG PO TABS: 5.0000 mg | ORAL_TABLET | Freq: Three times a day (TID) | ORAL | Status: DC | PRN

## 2016-06-06 MED ORDER — PHENOL 1.4 % MT LIQD
1.0000 | OROMUCOSAL | Status: DC | PRN
Start: 2016-06-06 — End: 2016-06-09

## 2016-06-06 MED ORDER — SUCCINYLCHOLINE CHLORIDE 20 MG/ML IJ SOLN
INTRAMUSCULAR | Status: AC
  Filled 2016-06-06: qty 1

## 2016-06-06 MED ORDER — PROPOFOL 500 MG/50ML IV EMUL
INTRAVENOUS | Status: DC | PRN
  Administered 2016-06-06: 25 ug/kg/min via INTRAVENOUS

## 2016-06-06 MED ORDER — BUPIVACAINE-EPINEPHRINE (PF) 0.5% -1:200000 IJ SOLN
INTRAMUSCULAR | Status: AC
  Filled 2016-06-06: qty 60

## 2016-06-06 MED ORDER — HYDROCODONE-ACETAMINOPHEN 5-325 MG PO TABS
1.0000 | ORAL_TABLET | Freq: Four times a day (QID) | ORAL | Status: DC | PRN
  Administered 2016-06-07 – 2016-06-08 (×2): 2 via ORAL
  Filled 2016-06-06 (×2): qty 2

## 2016-06-06 MED ORDER — MIDAZOLAM HCL 2 MG/2ML IJ SOLN
INTRAMUSCULAR | Status: AC
  Filled 2016-06-06: qty 2

## 2016-06-06 MED ORDER — ONDANSETRON HCL 4 MG PO TABS: 4.0000 mg | ORAL_TABLET | Freq: Four times a day (QID) | ORAL | Status: DC | PRN

## 2016-06-06 MED ORDER — METOCLOPRAMIDE HCL 5 MG/ML IJ SOLN: 5.0000 mg | Freq: Three times a day (TID) | INTRAMUSCULAR | Status: DC | PRN

## 2016-06-06 SURGICAL SUPPLY — 63 items
BAG HAMPER (MISCELLANEOUS) ×3 IMPLANT
BIT DRILL 2.8X128 (BIT) ×2 IMPLANT
BIT DRILL 2.8X128MM (BIT) ×1
BLADE 10 SAFETY STRL DISP (BLADE) ×3 IMPLANT
BLADE HEX COATED 2.75 (ELECTRODE) ×3 IMPLANT
BLADE SAGITTAL 25.0X1.27X90 (BLADE) ×2 IMPLANT
BLADE SAGITTAL 25.0X1.27X90MM (BLADE) ×1
BRUSH FEMORAL CANAL (MISCELLANEOUS) IMPLANT
CAPT HIP HEMI 1 ×3 IMPLANT
CHLORAPREP W/TINT 26ML (MISCELLANEOUS) ×3 IMPLANT
CLOTH BEACON ORANGE TIMEOUT ST (SAFETY) ×3 IMPLANT
COVER LIGHT HANDLE STERIS (MISCELLANEOUS) ×6 IMPLANT
COVER PROBE W GEL 5X96 (DRAPES) ×3 IMPLANT
DECANTER SPIKE VIAL GLASS SM (MISCELLANEOUS) ×6 IMPLANT
DRAPE HIP W/POCKET STRL (DRAPE) ×3 IMPLANT
DRAPE INCISE IOBAN 66X45 STRL (DRAPES) ×3 IMPLANT
DRAPE U-SHAPE 47X51 STRL (DRAPES) ×3 IMPLANT
DRSG MEPILEX BORDER 4X12 (GAUZE/BANDAGES/DRESSINGS) ×3 IMPLANT
ELECT REM PT RETURN 9FT ADLT (ELECTROSURGICAL) ×3
ELECTRODE REM PT RTRN 9FT ADLT (ELECTROSURGICAL) ×1 IMPLANT
EVACUATOR 3/16  PVC DRAIN (DRAIN)
EVACUATOR 3/16 PVC DRAIN (DRAIN) IMPLANT
FACESHIELD LNG OPTICON STERILE (SAFETY) ×3 IMPLANT
GLOVE BIOGEL PI IND STRL 6.5 (GLOVE) ×1 IMPLANT
GLOVE BIOGEL PI IND STRL 7.0 (GLOVE) ×1 IMPLANT
GLOVE BIOGEL PI INDICATOR 6.5 (GLOVE) ×2
GLOVE BIOGEL PI INDICATOR 7.0 (GLOVE) ×2
GLOVE SKINSENSE NS SZ8.0 LF (GLOVE) ×6
GLOVE SKINSENSE STRL SZ8.0 LF (GLOVE) ×3 IMPLANT
GLOVE SS N UNI LF 8.5 STRL (GLOVE) ×3 IMPLANT
GOWN STRL REUS W/TWL LRG LVL3 (GOWN DISPOSABLE) ×6 IMPLANT
GOWN STRL REUS W/TWL XL LVL3 (GOWN DISPOSABLE) ×3 IMPLANT
HANDPIECE INTERPULSE COAX TIP (DISPOSABLE) ×2
INST SET MAJOR BONE (KITS) ×3 IMPLANT
IV NS IRRIG 3000ML ARTHROMATIC (IV SOLUTION) ×3 IMPLANT
KIT BLADEGUARD II DBL (SET/KITS/TRAYS/PACK) ×3 IMPLANT
KIT ROOM TURNOVER APOR (KITS) ×3 IMPLANT
MANIFOLD NEPTUNE II (INSTRUMENTS) ×3 IMPLANT
MARKER SKIN DUAL TIP RULER LAB (MISCELLANEOUS) ×3 IMPLANT
NEEDLE HYPO 21X1.5 SAFETY (NEEDLE) ×3 IMPLANT
NS IRRIG 1000ML POUR BTL (IV SOLUTION) ×3 IMPLANT
PACK TOTAL JOINT (CUSTOM PROCEDURE TRAY) ×3 IMPLANT
PAD ARMBOARD 7.5X6 YLW CONV (MISCELLANEOUS) ×3 IMPLANT
PASSER SUT SWANSON 36MM LOOP (INSTRUMENTS) IMPLANT
PILLOW HIP ABDUCTION LRG (ORTHOPEDIC SUPPLIES) IMPLANT
PILLOW HIP ABDUCTION MED (ORTHOPEDIC SUPPLIES) ×3 IMPLANT
PIN STMN SNGL STERILE 9X3.6MM (PIN) ×6 IMPLANT
SET BASIN LINEN APH (SET/KITS/TRAYS/PACK) ×3 IMPLANT
SET HNDPC FAN SPRY TIP SCT (DISPOSABLE) ×1 IMPLANT
STAPLER VISISTAT 35W (STAPLE) ×3 IMPLANT
SUT BRALON NAB BRD #1 30IN (SUTURE) ×6 IMPLANT
SUT ETHIBOND 5 LR DA (SUTURE) ×6 IMPLANT
SUT MNCRL 0 VIOLET CTX 36 (SUTURE) ×1 IMPLANT
SUT MON AB 2-0 CT1 36 (SUTURE) ×3 IMPLANT
SUT MONOCRYL 0 CTX 36 (SUTURE) ×2
SUT VIC AB 1 CT1 27 (SUTURE)
SUT VIC AB 1 CT1 27XBRD ANTBC (SUTURE) IMPLANT
SYR 30ML LL (SYRINGE) ×3 IMPLANT
SYR BULB IRRIGATION 50ML (SYRINGE) ×3 IMPLANT
TOWER CARTRIDGE SMART MIX (DISPOSABLE) IMPLANT
TRAY FOLEY CATH SILVER 16FR (SET/KITS/TRAYS/PACK) IMPLANT
WATER STERILE IRR 1000ML POUR (IV SOLUTION) ×6 IMPLANT
YANKAUER SUCT 12FT TUBE ARGYLE (SUCTIONS) ×3 IMPLANT

## 2016-06-06 NOTE — Brief Op Note (Signed)
06/05/2016 - 06/06/2016  3:29 PM  PATIENT:  Barbara Lucero  76 y.o. female  PRE-OPERATIVE DIAGNOSIS:  displaced left femoral neck fracture  POST-OPERATIVE DIAGNOSIS:  displaced left femoral neck fracture  PROCEDURE:  Procedure(s): ARTHROPLASTY BIPOLAR HIP (HEMIARTHROPLASTY) (Left)  SURGEON:  Surgeon(s) and Role:    * Vickki Hearing, MD - Primary  Assist:  [place] and Shon Hale ???   ANESTHESIA:   spinal  EBL:  Total I/O In: 1525 [I.V.:1525] Out: 625 [Urine:475; Blood:150]  BLOOD ADMINISTERED:none  DRAINS: none   LOCAL MEDICATIONS USED:  MARCAINE     SPECIMEN:  No Specimen  DISPOSITION OF SPECIMEN:  N/A  COUNTS:  YES  TOURNIQUET:  * No tourniquets in log *  DICTATION: .Dragon Dictation  PLAN OF CARE: Admit to inpatient   PATIENT DISPOSITION:  PACU - hemodynamically stable.   Delay start of Pharmacological VTE agent (>24hrs) due to surgical blood loss or risk of bleeding: yes  [phone]

## 2016-06-06 NOTE — Consult Note (Signed)
HOSPITAL CONSULT [phone] (HIP FRACTURE )  MDM= MODERATE COMPLEXITY COMP HISTORY COMP EXAM  Patient ID: Barbara Lucero, female   DOB: Oct 20, 1940, 76 y.o.   MRN: [id number]  New patient  Requested by:  Reason for:   Chief Complaint  Patient presents with  . Hip Pain     Barbara Lucero is a 76 y.o. female.   HPI 76 year old female fell on the 11th had left hip pain then on the 12th tried to sit down on the toilet and felt a crack and then couldn't walk. She complains of left hip pain, left lower back pain. Left shoulder pain. Pain is been present for the last 2 days. She complains of severe pain. Especially in the left hip. It is sharp as well as dull and is associated with some aching. It is worsened with movement  Review of Systems (all) Review of Systems  Constitutional: Negative for chills and fever.  HENT: Negative for congestion, ear discharge, ear pain, hearing loss, nosebleeds, sinus pain, sore throat and tinnitus.   Respiratory: Positive for shortness of breath.   Cardiovascular: Negative for chest pain.  Gastrointestinal: Negative for heartburn.  Genitourinary: Negative for dysuria, frequency and urgency.  Musculoskeletal: Positive for back pain.  Skin: Negative for itching and rash.  Neurological: Negative for dizziness.  Endo/Heme/Allergies: Negative for environmental allergies and polydipsia. Does not bruise/bleed easily.  Psychiatric/Behavioral: Negative for depression.    Past Medical History:  Diagnosis Date  . Anxiety   . Arthritis   . Crohn disease (HCC)   . Depression      Past Surgical History:  Procedure Laterality Date  . BOWEL RESECTION       Family History  Problem Relation Age of Onset  . Breast cancer Sister   . Breast cancer Sister    Social History Social History  Substance Use Topics  . Smoking status: Never Smoker  . Smokeless tobacco: Never Used  . Alcohol use No     No Known Allergies  Current Facility-Administered Medications   Medication Dose Route Frequency Provider Last Rate Last Dose  . 0.9 %  sodium chloride infusion   Intravenous Continuous Kathlen Mody, MD 75 mL/hr at 06/06/16 0300    . 0.9 %  sodium chloride infusion   Intravenous Once Vickki Hearing, MD      . acetaminophen (TYLENOL) tablet 650 mg  650 mg Oral Q8H PRN Kathlen Mody, MD      . doxepin (SINEQUAN) capsule 50 mg  50 mg Oral QHS Kathlen Mody, MD   50 mg at 06/05/16 2154  . gabapentin (NEURONTIN) capsule 300 mg  300 mg Oral TID PRN Kathlen Mody, MD      . morphine 2 MG/ML injection 1-2 mg  1-2 mg Intravenous Q4H PRN Kathlen Mody, MD   2 mg at 06/06/16 0439  . ondansetron (ZOFRAN) tablet 4 mg  4 mg Oral Q6H PRN Kathlen Mody, MD       Or  . ondansetron (ZOFRAN) injection 4 mg  4 mg Intravenous Q6H PRN Kathlen Mody, MD      . psyllium (HYDROCIL/METAMUCIL) packet 1 packet  1 packet Oral QHS Kathlen Mody, MD      . sulfaSALAzine (AZULFIDINE) tablet 500 mg  500 mg Oral BID Kathlen Mody, MD   500 mg at 06/05/16 2154     Physical Exam(=30) Blood pressure (!) 153/60, pulse 100, temperature 99.3 F (37.4 C), temperature source Oral, resp. rate 18, height 5\' 6"  (1.676 m), weight  226 lb 4.8 oz (102.6 kg), SpO2 93 %. Gen. Appearance No gross abnormalities no developmental abnormalities. No grooming are hygiene issues  Peripheral vascular system she has changes in her skin which are consistent with venous stasis disease, says she has poor circulation. Inflow seems normal with good pulses but outflow seems to be affected with pretibial skin changes including hardening of the skin is a lot of nodularity in the skin and discoloration Lymph nodes groin right and left normal Gait patient cannot ambulate because of fracture  Left Upper extremity  Inspection revealed no malalignment or asymmetry  Assessment of range of motion: Decreased range of motion in flexion normal external rotation mild pain with abduction  Assessment of stability: Elbow wrist and hand  and shoulder were stable  Assessment of muscle strength and tone revealed grade 4/5 muscle strength and normal muscle tone in the shoulder  Skin was normal without rash lesion or ulceration  Right upper extremity  Inspection revealed no malalignment or asymmetry  Assessment of range of motion: Decreased range of motion was flexion abduction Assessment of stability: Elbow wrist and hand and shoulder were stable  Assessment of muscle strength and tone revealed  normal muscle tone  Skin was normal without rash lesion or ulceration  Right Lower extremity  Inspection revealed no malalignment or asymmetry  Assessment of range of motion: Full range of motion was recorded  Assessment of stability: Ankle, knee and hip were stable  Assessment of muscle strength and tone revealed grade 5 muscle strength and normal muscle tone  Skin was normal without rash lesion or ulceration  Left lower extremity  Inspection revealed shortening and extra rotation with tenderness over the left greater trochanter and left groin  Assessment of range of motion: Painful internal rotation of the hip with decreased range of motion in all planes Assessment of stability: Although the ankle and knee were stable the hip could not be examined  Assessment of muscle strength and tone revealed normal muscle tone Skin was normal without rash lesion or ulceration   Coordination was tested by finger-to-nose nose and was normal Deep tendon reflexes were 2+ in the upper extremities and deferred in the upper extremities Examination of sensation by touch was normal in all 4 extremities  Mental status  Oriented to time person and place normal  Mood and affect normal without depression anxiety or agitation  Dx:   Data Reviewed CBC Latest Ref Rng & Units 06/05/2016  WBC 4.0 - 10.5 K/uL 10.7(H)  Hemoglobin 12.0 - 15.0 g/dL 16.1  Hematocrit 09.6 - 46.0 % 42.1  Platelets 150 - 400 K/uL 166   BMP Latest Ref Rng & Units 06/05/2016   Glucose 65 - 99 mg/dL 045(W)  BUN 6 - 20 mg/dL 13  Creatinine 0.98 - 1.19 mg/dL 1.47  Sodium 829 - 562 mmol/L 136  Potassium 3.5 - 5.1 mmol/L 3.6  Chloride 101 - 111 mmol/L 98(L)  CO2 22 - 32 mmol/L 32  Calcium 8.9 - 10.3 mg/dL 1.3(Y)    I reviewed the following images and the reports and my independent interpretation is Displaced femoral neck fracture with no arthritic changes  Assessment  The patient came in with a hypoxemia which is a questionable chronic or acute. This seems to have corrected with oxygen.  Therefore, recommending that she have a bipolar versus a total hip replacement. This will be less surgery and will get her walking and relieve her pain  Plan   Left bipolar partial hip replacement.  I discussed with her the possibility of complications which include bleeding infection pulmonary embolus DVT and dislocation as well as limb length discrepancy and limping  She seems to be understanding the risk of surgery and agrees with the procedure.   Vickki Hearing MD

## 2016-06-06 NOTE — Progress Notes (Signed)
Subjective: She was admitted yesterday by the hospitalist for hip fracture. Dr. Aline Brochure has seen her and surgery is planned for later today. She says she still having pain in her hip. She denies chest pain nausea vomiting diarrhea shortness of breath.  Objective: Vital signs in last 24 hours: Temp:  [98.1 F (36.7 C)-99.3 F (37.4 C)] 98.8 F (37.1 C) (04/13 0812) Pulse Rate:  [54-100] 97 (04/13 0812) Resp:  [15-20] 18 (04/13 0812) BP: (142-162)/(60-79) 162/75 (04/13 0812) SpO2:  [88 %-100 %] 88 % (04/13 0812) Weight:  [102.6 kg (226 lb 4.8 oz)] 102.6 kg (226 lb 4.8 oz) (04/12 1457) Weight change:     Intake/Output from previous day: 04/12 0701 - 04/13 0700 In: 956.3 [I.V.:956.3] Out: 850 [Urine:850]  PHYSICAL EXAM General appearance: alert, cooperative and moderate distress Resp: clear to auscultation bilaterally Cardio: regular rate and rhythm, S1, S2 normal, no murmur, click, rub or gallop GI: soft, non-tender; bowel sounds normal; no masses,  no organomegaly Extremities: Changes of hip fracture Skin warm and dry. Mucous membranes are moist  Lab Results:  Results for orders placed or performed during the hospital encounter of 06/05/16 (from the past 48 hour(s))  CBC with Differential     Status: Abnormal   Collection Time: 06/05/16 10:12 AM  Result Value Ref Range   WBC 10.7 (H) 4.0 - 10.5 K/uL   RBC 4.60 3.87 - 5.11 MIL/uL   Hemoglobin 13.5 12.0 - 15.0 g/dL   HCT 42.1 36.0 - 46.0 %   MCV 91.5 78.0 - 100.0 fL   MCH 29.3 26.0 - 34.0 pg   MCHC 32.1 30.0 - 36.0 g/dL   RDW 13.8 11.5 - 15.5 %   Platelets 166 150 - 400 K/uL   Neutrophils Relative % 82 %   Neutro Abs 8.9 (H) 1.7 - 7.7 K/uL   Lymphocytes Relative 9 %   Lymphs Abs 0.9 0.7 - 4.0 K/uL   Monocytes Relative 8 %   Monocytes Absolute 0.8 0.1 - 1.0 K/uL   Eosinophils Relative 1 %   Eosinophils Absolute 0.1 0.0 - 0.7 K/uL   Basophils Relative 0 %   Basophils Absolute 0.0 0.0 - 0.1 K/uL  Basic metabolic panel      Status: Abnormal   Collection Time: 06/05/16 10:12 AM  Result Value Ref Range   Sodium 136 135 - 145 mmol/L   Potassium 3.6 3.5 - 5.1 mmol/L   Chloride 98 (L) 101 - 111 mmol/L   CO2 32 22 - 32 mmol/L   Glucose, Bld 166 (H) 65 - 99 mg/dL   BUN 13 6 - 20 mg/dL   Creatinine, Ser 0.60 0.44 - 1.00 mg/dL   Calcium 8.6 (L) 8.9 - 10.3 mg/dL   GFR calc non Af Amer >60 >60 mL/min   GFR calc Af Amer >60 >60 mL/min    Comment: (NOTE) The eGFR has been calculated using the CKD EPI equation. This calculation has not been validated in all clinical situations. eGFR's persistently <60 mL/min signify possible Chronic Kidney Disease.    Anion gap 6 5 - 15  Protime-INR     Status: None   Collection Time: 06/05/16 10:15 AM  Result Value Ref Range   Prothrombin Time 13.1 11.4 - 15.2 seconds   INR 0.99   Type and screen [facility]     Status: None (Preliminary result)   Collection Time: 06/05/16 10:15 AM  Result Value Ref Range   ABO/RH(D) A NEG    Antibody Screen  NEG    Sample Expiration 06/08/2016    Unit Number [id number]    Blood Component Type RED CELLS,LR    Unit division 00    Status of Unit ALLOCATED    Transfusion Status OK TO TRANSFUSE    Crossmatch Result Compatible    Unit Number [id number]    Blood Component Type RED CELLS,LR    Unit division 00    Status of Unit ALLOCATED    Transfusion Status OK TO TRANSFUSE    Crossmatch Result Compatible   Prepare RBC     Status: None   Collection Time: 06/05/16 10:15 AM  Result Value Ref Range   Order Confirmation ORDER PROCESSED BY BLOOD BANK   ABO/Rh     Status: None   Collection Time: 06/05/16 10:15 AM  Result Value Ref Range   ABO/RH(D) A NEG   Surgical pcr screen     Status: None   Collection Time: 06/05/16 10:45 PM  Result Value Ref Range   MRSA, PCR NEGATIVE NEGATIVE   Staphylococcus aureus NEGATIVE NEGATIVE    Comment:        The Xpert SA Assay (FDA approved for NASAL specimens in patients over 21 years  of age), is one component of a comprehensive surveillance program.  Test performance has been validated by [facility] for patients greater than or equal to 1 year old. It is not intended to diagnose infection nor to guide or monitor treatment.     ABGS No results for input(s): PHART, PO2ART, TCO2, HCO3 in the last 72 hours.  Invalid input(s): PCO2 CULTURES Recent Results (from the past 240 hour(s))  Surgical pcr screen     Status: None   Collection Time: 06/05/16 10:45 PM  Result Value Ref Range Status   MRSA, PCR NEGATIVE NEGATIVE Final   Staphylococcus aureus NEGATIVE NEGATIVE Final    Comment:        The Xpert SA Assay (FDA approved for NASAL specimens in patients over 29 years of age), is one component of a comprehensive surveillance program.  Test performance has been validated by [facility] for patients greater than or equal to 54 year old. It is not intended to diagnose infection nor to guide or monitor treatment.    Studies/Results: Dg Chest 1 View  Result Date: 06/05/2016 CLINICAL DATA:  76 year old female status post trip and fall in door way last night. Pain including left hip, and unable to use left leg this morning. EXAM: CHEST 1 VIEW COMPARISON:  Left shoulder series today reported separately. FINDINGS: AP supine view of the chest at 0905 hours. Low normal lung volumes. Cardiac size at the upper limits of normal. Other mediastinal contours are within normal limits. Calcified aortic atherosclerosis. Visualized tracheal air column is within normal limits. Allowing for portable technique the lungs are clear. No acute osseous abnormality identified. IMPRESSION: 1. No acute cardiopulmonary abnormality. No acute traumatic injury identified to the chest. 2.  Calcified aortic atherosclerosis. Electronically Signed   By: Genevie Ann M.D.   On: 06/05/2016 09:58   Dg Knee 2 Views Left  Result Date: 06/05/2016 CLINICAL DATA:  76 year old female status post trip and fall  in door way last night. Pain including left hip, and unable to use left leg this morning. EXAM: LEFT KNEE - 1-2 VIEW COMPARISON:  Left knee MRI 08/09/2010 FINDINGS: Cross-table AP and lateral views of the left knee. The lateral view is oblique. Osteopenia. Tricompartmental joint space loss and degenerative spurring. No definite joint effusion. The patella  appears intact. No acute osseous abnormality identified. IMPRESSION: Tricompartmental degenerative changes. No acute fracture or dislocation identified about the left knee. Electronically Signed   By: Genevie Ann M.D.   On: 06/05/2016 09:53   Dg Ankle Complete Left  Result Date: 06/05/2016 CLINICAL DATA:  76 year old female status post trip and fall in door way last night. Pain including left hip, and unable to use left leg this morning. EXAM: LEFT ANKLE COMPLETE - 3+ VIEW COMPARISON:  None. FINDINGS: 3 cross table views of the left ankle. Osteopenia. Mortise joint alignment preserved. Talar dome intact. No ankle joint effusion identified. Calcaneus appears intact. No fracture of the distal tibia or fibula identified. Visible left foot osseous structures also appear intact. Vascular calcifications about the left tib-fib, likely in part phleboliths. IMPRESSION: Osteopenia. No acute fracture or dislocation identified about the left ankle. Electronically Signed   By: Genevie Ann M.D.   On: 06/05/2016 09:56   Ct Head Wo Contrast  Result Date: 06/05/2016 CLINICAL DATA:  Fall last night with head injury. Initial encounter. EXAM: CT HEAD WITHOUT CONTRAST TECHNIQUE: Contiguous axial images were obtained from the base of the skull through the vertex without intravenous contrast. COMPARISON:  None. FINDINGS: Brain: No evidence of acute infarction, hemorrhage, hydrocephalus, extra-axial collection or mass lesion/mass effect. 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 Vascular: Atherosclerotic calcification.  The vessel hyperdensity. Skull: Possible mild scalp injury  near the vertex. Negative for fracture. Sinuses/Orbits: Bilateral cataract resection. No posttraumatic finding. IMPRESSION: No evidence of intracranial injury or fracture. Electronically Signed   By: Monte Fantasia M.D.   On: 06/05/2016 10:10   Ct Chest W Contrast  Result Date: 06/05/2016 CLINICAL DATA:  Short of breath.  Hip fracture EXAM: CT CHEST WITH CONTRAST TECHNIQUE: Multidetector CT imaging of the chest was performed during intravenous contrast administration. CONTRAST:  36m ISOVUE-300 IOPAMIDOL (ISOVUE-300) INJECTION 61% COMPARISON:  Chest x-ray 06/05/2016 FINDINGS: Cardiovascular: Routine chest CT with contrast was ordered. CT technique not performed. There is limited ability to diagnose pulmonary emboli on this study. No large central emboli identified. Atherosclerotic aortic arch without aneurysm or dissection. Extensive coronary calcification. Heart size upper normal. Mediastinum/Nodes: Negative Lungs/Pleura: Mild dependent atelectasis in the lung bases. Negative for pneumonia. Negative for mass or effusion. Upper Abdomen: Negative Musculoskeletal: Negative IMPRESSION: Extensive coronary calcification. Mild bibasilar atelectasis. No acute abnormalities chest CTA technique was not ordered for this study and there is limited ability to diagnose pulmonary emboli due to timing of contrast. Electronically Signed   By: CFranchot GalloM.D.   On: 06/05/2016 13:36   Dg Shoulder Left  Result Date: 06/05/2016 CLINICAL DATA:  76year old female status post trip and fall in door way last night. Pain including left hip, and unable to use left leg this morning. EXAM: LEFT SHOULDER - 2+ VIEW COMPARISON:  None. FINDINGS: Two views of the left shoulder. No glenohumeral joint dislocation. Bone mineralization is within normal limits for age. Proximal left humerus appears intact. Visible left clavicle and scapula appear intact. Visible left ribs and lung parenchyma are within normal limits. IMPRESSION: No acute  fracture or dislocation identified about the left shoulder. Electronically Signed   By: HGenevie AnnM.D.   On: 06/05/2016 09:57   Dg Hip Unilat W Or Wo Pelvis 2-3 Views Left  Result Date: 06/05/2016 CLINICAL DATA:  76year old female status post trip and fall in door way last night. Pain including left hip, and unable to use left leg this morning. EXAM: DG HIP (WITH  OR WITHOUT PELVIS) 2-3V LEFT COMPARISON:  None. FINDINGS: Left femoral neck fracture with varus impaction. The left femoral head remains normally located. The intertrochanteric segment of the proximal left femur appears to remain intact. Right femoral head normally located. Grossly intact proximal right femur. No superimposed pelvic fracture identified. Sacral ala and SI joints appear within normal limits. Probable pessary projects over the central pelvis. IMPRESSION: 1. Left femoral neck fracture with varus impaction. 2. No other acute fracture or dislocation identified about the pelvis. Electronically Signed   By: Genevie Ann M.D.   On: 06/05/2016 09:52    Medications:  Prior to Admission:  Prescriptions Prior to Admission  Medication Sig Dispense Refill Last Dose  . acetaminophen (TYLENOL) 650 MG CR tablet Take 650 mg by mouth every 8 (eight) hours as needed for pain.   06/05/2016 at Unknown time  . Ascorbic Acid (VITAMIN C) 1000 MG tablet Take 1,000 mg by mouth daily.   06/04/2016 at Unknown time  . b complex vitamins tablet Take 1 tablet by mouth daily.   06/04/2016 at Unknown time  . doxepin (SINEQUAN) 50 MG capsule Take 50 mg by mouth at bedtime.    06/04/2016 at Unknown time  . gabapentin (NEURONTIN) 300 MG capsule Take 300 mg by mouth 3 (three) times daily as needed (pain).    06/05/2016 at Unknown time  . ibuprofen (ADVIL,MOTRIN) 200 MG tablet Take 200 mg by mouth every 6 (six) hours as needed for moderate pain.   06/05/2016 at Unknown time  . Multiple Vitamin (MULTIVITAMIN) tablet Take 1 tablet by mouth daily.   06/04/2016 at Unknown time   . psyllium (METAMUCIL) 58.6 % packet Take 1 packet by mouth at bedtime.   06/04/2016 at Unknown time  . sulfaSALAzine (AZULFIDINE) 500 MG tablet Take 500 mg by mouth 2 (two) times daily.    06/04/2016 at Unknown time  . vitamin E 400 UNIT capsule Take 400 Units by mouth daily.   06/04/2016 at Unknown time   Scheduled: . sodium chloride   Intravenous Once  . doxepin  50 mg Oral QHS  . psyllium  1 packet Oral QHS  . sulfaSALAzine  500 mg Oral BID   Continuous: . sodium chloride 75 mL/hr at 06/06/16 0300   GYK:[id number], gabapentin, morphine injection, ondansetron **OR** ondansetron (ZOFRAN) IV  Assesment: She has a hip fracture. She has previous history of other fractures as well. She has chronic GI problems which are stable Active Problems:   Hip fracture (HCC)    Plan: She is okay for surgery    LOS: 1 day   Michaelah Credeur L 06/06/2016, 8:45 AM

## 2016-06-06 NOTE — Anesthesia Procedure Notes (Addendum)
Spinal  Patient location during procedure: OR Start time: 06/06/2016 2:50 PM Staffing Resident/CRNA: Glynn Octave E Performed: resident/CRNA  Preanesthetic Checklist Completed: patient identified, site marked, surgical consent, pre-op evaluation, timeout performed, IV checked, risks and benefits discussed and monitors and equipment checked Spinal Block Patient position: left lateral decubitus Prep: Betadine Patient monitoring: heart rate, cardiac monitor, continuous pulse ox and blood pressure Approach: left paramedian Location: L3-4 Injection technique: single-shot Needle Needle type: Spinocan  Needle gauge: 22 G Needle length: 9 cm Assessment Sensory level: T8 Additional Notes  ATTEMPTS:1 TRAY OI:[id number] TRAY EXPIRATION DATE:02/23/2017

## 2016-06-06 NOTE — Anesthesia Postprocedure Evaluation (Signed)
Anesthesia Post Note  Patient: Barbara Lucero  Procedure(s) Performed: Procedure(s) (LRB): ARTHROPLASTY BIPOLAR HIP (HEMIARTHROPLASTY) (Left)  Patient location during evaluation: PACU Anesthesia Type: Spinal Level of consciousness: awake and alert and oriented Pain management: pain level controlled Vital Signs Assessment: post-procedure vital signs reviewed and stable Respiratory status: spontaneous breathing Cardiovascular status: stable and blood pressure returned to baseline Postop Assessment: no headache, no signs of nausea or vomiting and spinal receding Anesthetic complications: no     Last Vitals:  Vitals:   06/06/16 1156 06/06/16 1530  BP: 140/75 114/64  Pulse: 91 89  Resp: 15 17  Temp: 36.7 C (P) 36.9 C    Last Pain:  Vitals:   06/06/16 1156  TempSrc: Oral  PainSc: 0-No pain                 Alven Alverio

## 2016-06-06 NOTE — Anesthesia Preprocedure Evaluation (Addendum)
Anesthesia Evaluation  Patient identified by MRN, date of birth, ID band Patient awake    Reviewed: Allergy & Precautions, NPO status , Patient's Chart, lab work & pertinent test results  Airway Mallampati: II  TM Distance: <3 FB     Dental  (+) Edentulous Upper, Edentulous Lower   Pulmonary  Hypoxic in ED, neg CXR and chest CT, only basilar ATX.   breath sounds clear to auscultation       Cardiovascular negative cardio ROS   Rhythm:Regular Rate:Normal     Neuro/Psych PSYCHIATRIC DISORDERS Anxiety Depression    GI/Hepatic Crohn's disease   Endo/Other  Morbid obesity  Renal/GU      Musculoskeletal  (+) Arthritis ,   Abdominal   Peds  Hematology   Anesthesia Other Findings Fall - L hip Fx   Reproductive/Obstetrics                            Anesthesia Physical Anesthesia Plan  ASA: III and emergent  Anesthesia Plan: Spinal   Post-op Pain Management:    Induction: Intravenous  Airway Management Planned: Simple Face Mask  Additional Equipment:   Intra-op Plan:   Post-operative Plan:   Informed Consent: I have reviewed the patients History and Physical, chart, labs and discussed the procedure including the risks, benefits and alternatives for the proposed anesthesia with the patient or authorized representative who has indicated his/her understanding and acceptance.     Plan Discussed with: CRNA  Anesthesia Plan Comments: (Lateral positioning.)        Anesthesia Quick Evaluation

## 2016-06-06 NOTE — Care Management Note (Signed)
Case Management Note  Patient Details  Name: CUMI HAGEMANN MRN: [id number] Date of Birth: [birth year]  Subjective/Objective:   Adm with mechanical fall resulting in hip fracture. Chart reviewed. Currently still in surgery.  From home.                 Action/Plan: Anticipate DC to SNF. If patient does indeed go home and need HH PT, [facility] is an option as long as HH PT is the only need. [facility] cannot accept for [facility] RN currently due to patient's insurance. [facility] can also do any DME needs such as RW, etc.   Expected Discharge Date:  06/07/16               Expected Discharge Plan:  Skilled Nursing Facility  In-House Referral:     Discharge planning Services  CM Consult  Post Acute Care Choice:    Choice offered to:     DME Arranged:    DME Agency:     HH Arranged:    HH Agency:     Status of Service:  In process, will continue to follow  If discussed at Long Length of Stay Meetings, dates discussed:    Additional Comments:  Shulamis Wenberg, Chrystine Oiler, RN 06/06/2016, 3:09 PM

## 2016-06-06 NOTE — Transfer of Care (Signed)
Immediate Anesthesia Transfer of Care Note  Patient: Barbara Lucero  Procedure(s) Performed: Procedure(s): ARTHROPLASTY BIPOLAR HIP (HEMIARTHROPLASTY) (Left)  Patient Location: PACU  Anesthesia Type:Spinal  Level of Consciousness: awake  Airway & Oxygen Therapy: Patient Spontanous Breathing and Patient connected to nasal cannula oxygen  Post-op Assessment: Report given to RN  Post vital signs: Reviewed and stable  Last Vitals:  Vitals:   06/06/16 1145 06/06/16 1156  BP: 140/75 140/75  Pulse: 90 91  Resp: 15 15  Temp:  36.7 C    Last Pain:  Vitals:   06/06/16 1156  TempSrc: Oral  PainSc: 0-No pain      Patients Stated Pain Goal: 2 (06/06/16 0444)  Complications: No apparent anesthesia complications

## 2016-06-06 NOTE — Op Note (Signed)
OPERATIVE REPORT  Preop diagnosis left femoral neck fracture  Postop diagnosis same  Procedure open treatment internal fixation left hip fracture with bipolar prosthesis left hip  Surgeon Romeo Apple  Anesthesia spinal  Estimated blood loss 100 150 mL  Assisted by [place] and Marybeth  Operative findings completely displaced femoral neck fracture. No significant arthritis seen in the acetabulum. In fact there was no pitting and no exposed subchondral bone.  She had a tight flexion contracture of her knee with only 95 of flexion under anesthesia  The procedure was done as follows  After site marking and chart update the patient was taken to the operating suite. She had a spinal anesthetic administered and she was placed on the regular table lateral decubitus position left side up with axillary roll. A hip positioner was placed appropriate padding was placed  The leg was prepped and draped sterilely  Timeout was completed  The incision was centered over the greater trochanter extended 4 cm proximally and distally. Subcutaneous taste tissue was divided down to fascia. A hematoma was noted in subtenons tissue over the greater trochanter. The fascia was split in line with the skin incision exposing the greater trochanteric bursa. This was excised. The entire abductor musculature was identified. A hemostat was placed in the middle of the musculature and blunt dissection was carried down to the greater trochanter and then subperiosteal dissection removed the tendinous portion of the gluteus medius and then the underlying tendinous portion of the gluteus minimus were removed from the trochanter retracted proximally and then 2 Steinmann pins were placed in the pelvis to hold the tissue in place  A capsulectomy was performed hip was dislocated anteriorly and the head was removed.  We measured the head to be a 47 mm head.  We then prepared the proximal femur. We made a provisional neck cut  with a neck cutting guide. Open the canal with a pilot hole followed by canal finder, trochanteric reamer, and then we broached from a 3 through a 6 and that gave a good tight fit.  We trialed with a 1.5 neck and 47 head. We elected suction. We got good shock. Flexion was 120. Internal rotation was 60. External rotation was 50 at +5 extension. Leg lengths were equal.  We removed the trial components irrigated the acetabulum removed any extra bone and then placed the real components with 3 drill holes in the proximal femur to pass #5 suture  After thorough irrigation I injected 30 mL of Marcaine with epinephrine  We then re-attach the gluteus medius and minimus with the interrupted suture. This involved 2 #5 Ethibond sutures and then we oversewed that with a #1 Bralon suture. We abducted the hip and then closed the fascia with #1 Bralon in interrupted fashion.  Subfascially and injected a second dose of Marcaine with epinephrine 30 mL  I closed the skin with 0 Monocryl and staples  The patient was placed on a regular bed in the supine position she had equal leg lengths  An abduction pillow was placed  The postoperative plan Weightbearing as tolerated.  Staples should come out in 2 weeks Lateral hip precautions Two-week follow-up  [id number]

## 2016-06-07 LAB — URINE CULTURE
CULTURE: NO GROWTH
SPECIAL REQUESTS: NORMAL

## 2016-06-07 LAB — BASIC METABOLIC PANEL
ANION GAP: 4 — AB (ref 5–15)
BUN: 10 mg/dL (ref 6–20)
CALCIUM: 8.1 mg/dL — AB (ref 8.9–10.3)
CO2: 35 mmol/L — ABNORMAL HIGH (ref 22–32)
Chloride: 97 mmol/L — ABNORMAL LOW (ref 101–111)
Creatinine, Ser: 0.62 mg/dL (ref 0.44–1.00)
Glucose, Bld: 147 mg/dL — ABNORMAL HIGH (ref 65–99)
POTASSIUM: 3.9 mmol/L (ref 3.5–5.1)
Sodium: 136 mmol/L (ref 135–145)

## 2016-06-07 LAB — CBC
HEMATOCRIT: 37.8 % (ref 36.0–46.0)
Hemoglobin: 11.8 g/dL — ABNORMAL LOW (ref 12.0–15.0)
MCH: 29.4 pg (ref 26.0–34.0)
MCHC: 31.2 g/dL (ref 30.0–36.0)
MCV: 94.3 fL (ref 78.0–100.0)
Platelets: 168 10*3/uL (ref 150–400)
RBC: 4.01 MIL/uL (ref 3.87–5.11)
RDW: 13.9 % (ref 11.5–15.5)
WBC: 13.3 10*3/uL — AB (ref 4.0–10.5)

## 2016-06-07 MED ORDER — DEXTROSE 5 % IV SOLN
INTRAVENOUS | Status: AC
  Filled 2016-06-07: qty 2

## 2016-06-07 MED ORDER — CEFAZOLIN SODIUM-DEXTROSE 2-4 GM/100ML-% IV SOLN
INTRAVENOUS | Status: AC
  Filled 2016-06-07: qty 100

## 2016-06-07 NOTE — Progress Notes (Signed)
Subjective: She is one day post surgery for hip fracture. She's doing well. She has no complaints. Her breathing is doing well.  Objective: Vital signs in last 24 hours: Temp:  [98 F (36.7 C)-99.1 F (37.3 C)] 98.5 F (36.9 C) (04/14 0330) Pulse Rate:  [89-163] 97 (04/14 0330) Resp:  [14-20] 20 (04/14 0330) BP: (97-149)/(56-75) 102/60 (04/14 0330) SpO2:  [90 %-96 %] 92 % (04/14 0330) Weight:  [102.5 kg (226 lb)] 102.5 kg (226 lb) (04/13 1156) Weight change: 4.99 kg (11 lb)    Intake/Output from previous day: 04/13 0701 - 04/14 0700 In: 3520 [P.O.:120; I.V.:3300; IV Piggyback:100] Out: 1175 [Urine:1025; Blood:150]  PHYSICAL EXAM General appearance: alert, cooperative and no distress Resp: clear to auscultation bilaterally Cardio: regular rate and rhythm, S1, S2 normal, no murmur, click, rub or gallop GI: soft, non-tender; bowel sounds normal; no masses,  no organomegaly Extremities: I did not examine her surgical wound as her surgeon is on the floor Skin warm and dry. Mucous membranes are moist  Lab Results:  Results for orders placed or performed during the hospital encounter of 06/05/16 (from the past 48 hour(s))  Surgical pcr screen     Status: None   Collection Time: 06/05/16 10:45 PM  Result Value Ref Range   MRSA, PCR NEGATIVE NEGATIVE   Staphylococcus aureus NEGATIVE NEGATIVE    Comment:        The Xpert SA Assay (FDA approved for NASAL specimens in patients over 24 years of age), is one component of a comprehensive surveillance program.  Test performance has been validated by [facility] for patients greater than or equal to 48 year old. It is not intended to diagnose infection nor to guide or monitor treatment.   Glucose, capillary     Status: Abnormal   Collection Time: 06/06/16 12:26 PM  Result Value Ref Range   Glucose-Capillary 125 (H) 65 - 99 mg/dL  Glucose, capillary     Status: Abnormal   Collection Time: 06/06/16  3:32 PM  Result Value Ref  Range   Glucose-Capillary 124 (H) 65 - 99 mg/dL  CBC     Status: Abnormal   Collection Time: 06/07/16  6:16 AM  Result Value Ref Range   WBC 13.3 (H) 4.0 - 10.5 K/uL   RBC 4.01 3.87 - 5.11 MIL/uL   Hemoglobin 11.8 (L) 12.0 - 15.0 g/dL   HCT 37.8 36.0 - 46.0 %   MCV 94.3 78.0 - 100.0 fL   MCH 29.4 26.0 - 34.0 pg   MCHC 31.2 30.0 - 36.0 g/dL   RDW 13.9 11.5 - 15.5 %   Platelets 168 150 - 400 K/uL  Basic metabolic panel     Status: Abnormal   Collection Time: 06/07/16  6:16 AM  Result Value Ref Range   Sodium 136 135 - 145 mmol/L   Potassium 3.9 3.5 - 5.1 mmol/L   Chloride 97 (L) 101 - 111 mmol/L   CO2 35 (H) 22 - 32 mmol/L   Glucose, Bld 147 (H) 65 - 99 mg/dL   BUN 10 6 - 20 mg/dL   Creatinine, Ser 0.62 0.44 - 1.00 mg/dL   Calcium 8.1 (L) 8.9 - 10.3 mg/dL   GFR calc non Af Amer >60 >60 mL/min   GFR calc Af Amer >60 >60 mL/min    Comment: (NOTE) The eGFR has been calculated using the CKD EPI equation. This calculation has not been validated in all clinical situations. eGFR's persistently <60 mL/min signify possible Chronic Kidney  Disease.    Anion gap 4 (L) 5 - 15    ABGS No results for input(s): PHART, PO2ART, TCO2, HCO3 in the last 72 hours.  Invalid input(s): PCO2 CULTURES Recent Results (from the past 240 hour(s))  Urine culture     Status: None   Collection Time: 06/05/16 10:07 AM  Result Value Ref Range Status   Specimen Description URINE, CATHETERIZED  Final   Special Requests Normal  Final   Culture   Final    NO GROWTH Performed at [facility], [address], [place], [place] [address]    Report Status 06/07/2016 FINAL  Final  Surgical pcr screen     Status: None   Collection Time: 06/05/16 10:45 PM  Result Value Ref Range Status   MRSA, PCR NEGATIVE NEGATIVE Final   Staphylococcus aureus NEGATIVE NEGATIVE Final    Comment:        The Xpert SA Assay (FDA approved for NASAL specimens in patients over 31 years of age), is one component of a  comprehensive surveillance program.  Test performance has been validated by [facility] for patients greater than or equal to 67 year old. It is not intended to diagnose infection nor to guide or monitor treatment.    Studies/Results: Dg Pelvis 1-2 Views  Result Date: 06/06/2016 CLINICAL DATA:  Postop. EXAM: PELVIS - 1-2 VIEW COMPARISON:  06/05/2016. FINDINGS: Satisfactory appearance status post LEFT hip bipolar hemiarthroplasty. This was performed for a LEFT femoral neck fracture. No adverse features. IMPRESSION: As above. Electronically Signed   By: Staci Righter M.D.   On: 06/06/2016 16:20   Ct Chest W Contrast  Result Date: 06/05/2016 CLINICAL DATA:  Short of breath.  Hip fracture EXAM: CT CHEST WITH CONTRAST TECHNIQUE: Multidetector CT imaging of the chest was performed during intravenous contrast administration. CONTRAST:  17m ISOVUE-300 IOPAMIDOL (ISOVUE-300) INJECTION 61% COMPARISON:  Chest x-ray 06/05/2016 FINDINGS: Cardiovascular: Routine chest CT with contrast was ordered. CT technique not performed. There is limited ability to diagnose pulmonary emboli on this study. No large central emboli identified. Atherosclerotic aortic arch without aneurysm or dissection. Extensive coronary calcification. Heart size upper normal. Mediastinum/Nodes: Negative Lungs/Pleura: Mild dependent atelectasis in the lung bases. Negative for pneumonia. Negative for mass or effusion. Upper Abdomen: Negative Musculoskeletal: Negative IMPRESSION: Extensive coronary calcification. Mild bibasilar atelectasis. No acute abnormalities chest CTA technique was not ordered for this study and there is limited ability to diagnose pulmonary emboli due to timing of contrast. Electronically Signed   By: CFranchot GalloM.D.   On: 06/05/2016 13:36    Medications:  Prior to Admission:  Prescriptions Prior to Admission  Medication Sig Dispense Refill Last Dose  . acetaminophen (TYLENOL) 650 MG CR tablet Take 650 mg by mouth  every 8 (eight) hours as needed for pain.   06/05/2016 at Unknown time  . Ascorbic Acid (VITAMIN C) 1000 MG tablet Take 1,000 mg by mouth daily.   06/05/2016 at Unknown time  . b complex vitamins tablet Take 1 tablet by mouth daily.   06/05/2016 at Unknown time  . doxepin (SINEQUAN) 50 MG capsule Take 50 mg by mouth at bedtime.    06/05/2016 at Unknown time  . gabapentin (NEURONTIN) 300 MG capsule Take 300 mg by mouth 3 (three) times daily as needed (pain).    06/05/2016 at Unknown time  . ibuprofen (ADVIL,MOTRIN) 200 MG tablet Take 200 mg by mouth every 6 (six) hours as needed for moderate pain.   06/05/2016 at Unknown  time  . Multiple Vitamin (MULTIVITAMIN) tablet Take 1 tablet by mouth daily.   06/05/2016 at Unknown time  . psyllium (METAMUCIL) 58.6 % packet Take 1 packet by mouth at bedtime.   06/05/2016 at Unknown time  . sulfaSALAzine (AZULFIDINE) 500 MG tablet Take 500 mg by mouth 2 (two) times daily.    06/05/2016 at Unknown time  . vitamin E 400 UNIT capsule Take 400 Units by mouth daily.   06/05/2016 at Unknown time   Scheduled: . aspirin EC  325 mg Oral Q breakfast  . doxepin  50 mg Oral QHS  . psyllium  1 packet Oral QHS  . sulfaSALAzine  500 mg Oral BID   Continuous: . sodium chloride 75 mL/hr at 06/06/16 2326   ZHG:[id number] **OR** acetaminophen, acetaminophen, gabapentin, HYDROcodone-acetaminophen, menthol-cetylpyridinium **OR** phenol, metoCLOPramide **OR** metoCLOPramide (REGLAN) injection, morphine injection, ondansetron **OR** ondansetron (ZOFRAN) IV, ondansetron **OR** ondansetron (ZOFRAN) IV  Assesment: She was admitted with a hip fracture. She's doing very well. She has no complaints now. She had been short of breath and it is much improved Active Problems:   Hip fracture ([place])    Plan: Continue current treatments    LOS: 2 days   , L 06/07/2016, 10:20 AM

## 2016-06-07 NOTE — Addendum Note (Signed)
Addendum  created 06/07/16 1243 by Franco Nones, CRNA   Sign clinical note

## 2016-06-07 NOTE — Anesthesia Postprocedure Evaluation (Signed)
Anesthesia Post Note  Patient: Barbara Lucero  Procedure(s) Performed: Procedure(s) (LRB): ARTHROPLASTY BIPOLAR HIP (HEMIARTHROPLASTY) (Left)  Patient location during evaluation: [facility] Anesthesia Type: Spinal Level of consciousness: awake and alert Pain management: satisfactory to patient Vital Signs Assessment: post-procedure vital signs reviewed and stable Respiratory status: spontaneous breathing Cardiovascular status: stable Anesthetic complications: no     Last Vitals:  Vitals:   06/06/16 2330 06/07/16 0330  BP: 101/62 102/60  Pulse: (!) 156 97  Resp: 20 20  Temp: 36.7 C 36.9 C    Last Pain:  Vitals:   06/07/16 0928  TempSrc:   PainSc: 6                  Ranisha Allaire

## 2016-06-07 NOTE — Progress Notes (Signed)
Patient ID: Barbara Lucero, female   DOB: 14-Dec-1940, 76 y.o.   MRN: [id number]  BP 102/60 (BP Location: Right Arm)   Pulse 97   Temp 98.5 F (36.9 C) (Oral)   Resp 20   Ht 5\' 6"  (1.676 m)   Wt 226 lb (102.5 kg)   SpO2 92%   BMI 36.48 kg/m   Alert awake   No excessive edema  Dressing scant drainage   CBC Latest Ref Rng & Units 06/07/2016 06/05/2016  WBC 4.0 - 10.5 K/uL 13.3(H) 10.7(H)  Hemoglobin 12.0 - 15.0 g/dL 11.8(L) 13.5  Hematocrit 36.0 - 46.0 % 37.8 42.1  Platelets 150 - 400 K/uL 168 166   BMP Latest Ref Rng & Units 06/07/2016 06/05/2016  Glucose 65 - 99 mg/dL 503(U) 882(C)  BUN 6 - 20 mg/dL 10 13  Creatinine 0.03 - 1.00 mg/dL 4.91 7.91  Sodium 505 - 145 mmol/L 136 136  Potassium 3.5 - 5.1 mmol/L 3.9 3.6  Chloride 101 - 111 mmol/L 97(L) 98(L)  CO2 22 - 32 mmol/L 35(H) 32  Calcium 8.9 - 10.3 mg/dL 8.1(L) 8.6(L)    She is stable  The Hg is good  We can try PT today

## 2016-06-07 NOTE — Evaluation (Signed)
Physical Therapy Evaluation Patient Details Name: Barbara Lucero MRN: [id number] DOB: 06/20/1940 Today's Date: 06/07/2016   History of Present Illness  76 y.o. female with medical history significant of arthritis and crohn's disease presents with a fall, mechanical fall. and was found to have a hip fracture on the left. She reports pain in the left hip. On arrival to ED, she was found to be hypoxic and cxr does not show any acute pathology. I ordered CT chest, shows some basilar atelectasis. Lab work was unremarkable. She is referred to medical service for admission and orthopedics consulted. She denies any complaints other than left hip pain.  Pt is now s/p open treatment internal fixation left hip fracture with bipolar prosthesis left hip, Lateral hip precautions, and WBAT.      Clinical Impression  Pt received in bed, and is agreeable to PT evaluation.  She states that she normally is able to ambulate in the house without any device, but admits to furniture walking.  She uses a cane when in the community.  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  During PT evaluation she has great difficulty with ALL mobility.  She requires assistance for LE exercises on B LE's.  She required Max/Total A for supine<>sit with HOB raised and use of bed pad to assist hips to the EOB.  (Please note that pt normally sleeps in a lift chair at home).  She is very fearful of falling.  While sitting on the EOB she demonstrates posterior lean, and requires constant Min/Mod A to maintain static sitting balance.  She was returned to supine in bed with +2 person assistance.  At this point, she is not safe to d/c home due to increased need for assistance with all functional mobility.  She will need SNF at d/c.      Follow Up Recommendations SNF    Equipment Recommendations  None recommended by PT    Recommendations for Other Services       Precautions /  Restrictions Precautions Precautions: Fall Precaution Comments: Lateral Hip Precautions.  Fall with L hip fx is reason for admission. Restrictions Weight Bearing Restrictions: Yes LLE Weight Bearing: Weight bearing as tolerated      Mobility  Bed Mobility Overal bed mobility: Needs Assistance Bed Mobility: Supine to Sit     Supine to sit: Total assist;HOB elevated     General bed mobility comments: Pt assisted with moving her LE's off the EOB, and then she requires total A to lift trunk up off the edge.  Pt given multiple cues to push herself up, but instead she was was strongly pushing backwards.  Bed pad used to assist pt's hips to the EOB.  Once sitting on the EOB she has great difficulty maintaining upright sitting balance due to posterior lean.    Transfers Overall transfer level:  (Not appropriate to attempt due to poor static sitting posture.  )                  Ambulation/Gait                Stairs            Wheelchair Mobility    Modified Rankin (Stroke Patients Only)       Balance Overall balance assessment: History of Falls;Needs assistance Sitting-balance support: Feet supported;Bilateral upper extremity supported Sitting balance-Leahy Scale: Poor Sitting balance - Comments: Strong posterior  LOB, very kyphotic posture with inabiltiy to perform anterior pelvic tilt needed to sustain static sitting posture.  Pt holding onto the bed rail for support.  Pt able to perform 5 reps of UE reaching activity to assist with forward weight shift.                                       Pertinent Vitals/Pain Pain Assessment: 0-10 Pain Score: 9  Pain Location: L hip and lower back - crying out in pain during all mobility, and performing valsalva.   Pain Intervention(s): Premedicated before session;Limited activity within patient's tolerance;Monitored during session;Repositioned    Home Living   Living Arrangements: Spouse/significant  other;Children (dtr, granddaughter, and son) Available Help at Discharge: Available 24 hours/day Type of Home: House Home Access: Ramped entrance     Home Layout: One level Home Equipment: Cane - single point;Walker - 4 wheels (Pt states that she normally sleeps in a lift chair.  )      Prior Function     Gait / Transfers Assistance Needed: Pt states she is independent with ambulation at home, but admits to furniture walking.  Still driving, comunity ambulator with a cane.    ADL's / Homemaking Assistance Needed: independent  Comments: Pt states that she cares for her disabled son, and she sleeps in her lift chair, which is right next to his hospital bed.  She states that he has to change his dressings for his wounds, and help to flush his catheter, etc.  She states that her granddaughter helps, but the others in the household do not.       Hand Dominance   Dominant Hand: Right    Extremity/Trunk Assessment   Upper Extremity Assessment Upper Extremity Assessment: Generalized weakness    Lower Extremity Assessment Lower Extremity Assessment: Generalized weakness;RLE deficits/detail;LLE deficits/detail RLE Deficits / Details: All movement is very stiff with pt resisting movement from PT.   RLE: Unable to fully assess due to pain LLE Deficits / Details: Pt demonstrates valgus deformity, as well as knee flexion contracture, lacking full extension LLE: Unable to fully assess due to pain    Cervical / Trunk Assessment Cervical / Trunk Assessment: Kyphotic  Communication   Communication: No difficulties  Cognition Arousal/Alertness: Awake/alert Behavior During Therapy: Anxious Overall Cognitive Status: Within Functional Limits for tasks assessed                                        General Comments      Exercises Total Joint Exercises Ankle Circles/Pumps: AROM;Both;20 reps;Supine Quad Sets: Strengthening;Both;5 reps;Supine;Limitations Quad Sets  Limitations: Extremely poor activation of quads on B LE's.   Short Arc QuadBarbaraann Boys;Both;PROM;10 reps;Supine;Limitations Short Arc Quad Limitations: Continued poor activation of quads on the L LE  - only able to perform PROM for this exercise.  AAROM on the right.    Assessment/Plan    PT Assessment Patient needs continued PT services  PT Problem List Decreased strength;Decreased range of motion;Decreased activity tolerance;Decreased balance;Decreased mobility;Cardiopulmonary status limiting activity;Pain;Obesity       PT Treatment Interventions DME instruction;Gait training;Functional mobility training;Therapeutic activities;Therapeutic exercise;Balance training;Patient/family education;Neuromuscular re-education    PT Goals (Current goals can be found in the Care Plan section)  Acute Rehab PT Goals Patient Stated Goal: pt wants to get stronger and get  back home.  PT Goal Formulation: With patient Time For Goal Achievement: 06/21/16 Potential to Achieve Goals: Fair    Frequency 7X/week   Barriers to discharge        Co-evaluation               End of Session Equipment Utilized During Treatment: Oxygen Activity Tolerance: Patient limited by pain Patient left: in bed;with call bell/phone within reach;with nursing/sitter in room Nurse Communication: Mobility status;Need for lift equipment Leotis Shames, RN present during evaluation) PT Visit Diagnosis: Other abnormalities of gait and mobility (R26.89);Muscle weakness (generalized) (M62.81);History of falling (Z91.81);Pain Pain - Right/Left: Left Pain - part of body: Hip    Time: 3151-7616 PT Time Calculation (min) (ACUTE ONLY): 51 min   Charges:   PT Evaluation $PT Eval Low Complexity: 1 Procedure PT Treatments $Therapeutic Exercise: 8-22 mins $Therapeutic Activity: 8-22 mins   PT G Codes:   PT G-Codes **NOT FOR INPATIENT CLASS** Functional Assessment Tool Used: AM-PAC 6 Clicks Basic Mobility;Clinical  judgement Functional Limitation: Mobility: Walking and moving around Mobility: Walking and Moving Around Current Status (W7371): At least 80 percent but less than 100 percent impaired, limited or restricted Mobility: Walking and Moving Around Goal Status [phone]): At least 60 percent but less than 80 percent impaired, limited or restricted    Beth Autry Droege, PT, DPT X: [phone]

## 2016-06-08 DIAGNOSIS — K5090 Crohn's disease, unspecified, without complications: Secondary | ICD-10-CM | POA: Diagnosis present

## 2016-06-08 DIAGNOSIS — F411 Generalized anxiety disorder: Secondary | ICD-10-CM | POA: Diagnosis present

## 2016-06-08 DIAGNOSIS — F329 Major depressive disorder, single episode, unspecified: Secondary | ICD-10-CM | POA: Diagnosis present

## 2016-06-08 DIAGNOSIS — F32A Depression, unspecified: Secondary | ICD-10-CM | POA: Diagnosis present

## 2016-06-08 DIAGNOSIS — F419 Anxiety disorder, unspecified: Secondary | ICD-10-CM | POA: Diagnosis present

## 2016-06-08 LAB — CBC
HEMATOCRIT: 35.5 % — AB (ref 36.0–46.0)
Hemoglobin: 11.3 g/dL — ABNORMAL LOW (ref 12.0–15.0)
MCH: 29.8 pg (ref 26.0–34.0)
MCHC: 31.8 g/dL (ref 30.0–36.0)
MCV: 93.7 fL (ref 78.0–100.0)
Platelets: 142 10*3/uL — ABNORMAL LOW (ref 150–400)
RBC: 3.79 MIL/uL — ABNORMAL LOW (ref 3.87–5.11)
RDW: 13.8 % (ref 11.5–15.5)
WBC: 13 10*3/uL — ABNORMAL HIGH (ref 4.0–10.5)

## 2016-06-08 LAB — BASIC METABOLIC PANEL
Anion gap: 5 (ref 5–15)
BUN: 8 mg/dL (ref 6–20)
CALCIUM: 8 mg/dL — AB (ref 8.9–10.3)
CO2: 35 mmol/L — AB (ref 22–32)
CREATININE: 0.58 mg/dL (ref 0.44–1.00)
Chloride: 94 mmol/L — ABNORMAL LOW (ref 101–111)
GFR calc non Af Amer: >60 mL/min (ref >60)
GLUCOSE: 145 mg/dL — AB (ref 65–99)
Potassium: 3.8 mmol/L (ref 3.5–5.1)
Sodium: 134 mmol/L — ABNORMAL LOW (ref 135–145)

## 2016-06-08 NOTE — Progress Notes (Signed)
Physical Therapy Treatment Patient Details Name: Barbara Lucero MRN: [id number] DOB: 12-28-1940 Today's Date: 06/08/2016    History of Present Illness 76 y.o. female with medical history significant of arthritis and crohn's disease presents with a fall, mechanical fall. and was found to have a hip fracture on the left. She reports pain in the left hip. On arrival to ED, she was found to be hypoxic and cxr does not show any acute pathology. I ordered CT chest, shows some basilar atelectasis. Lab work was unremarkable. She is referred to medical service for admission and orthopedics consulted. She denies any complaints other than left hip pain.  Pt is now s/p open treatment internal fixation left hip fracture with bipolar prosthesis left hip, Lateral hip precautions, and WBAT.      PT Comments    Pt received in bed, husband present, and pt was agreeable to PT tx.  Pt required Max A to roll and place Maxi move sling.  She was transferred bed<>chair via Maxi move with increased time due to pt's anxiety. Once positioned in the chair, she was able to perform LE exercises better.  She is used to sleeping in a recliner at home, and finds the bed very difficult to move in.  Continue to recommend SNF.   Follow Up Recommendations  SNF     Equipment Recommendations  None recommended by PT    Recommendations for Other Services       Precautions / Restrictions Precautions Precautions: Fall Precaution Comments: Lateral Hip Precautions.  Fall with L hip fx is reason for admission. Restrictions Weight Bearing Restrictions: Yes LLE Weight Bearing: Weight bearing as tolerated    Mobility  Bed Mobility Overal bed mobility: Needs Assistance Bed Mobility: Rolling Rolling: Max assist            Transfers Overall transfer level: Needs assistance               General transfer comment: Total A for transfer bed<>chair due to poor strength, balance and mobility.  Once pt was positioned in the  chair, she was able to participate more with LE exercises.  Pt is used to sleeping in a recliner at home, and the task of transferring supine<>sit is very difficult for her at baseline.    Ambulation/Gait Ambulation/Gait assistance:  (NA due to poor strength)               Stairs            Wheelchair Mobility    Modified Rankin (Stroke Patients Only)       Balance                                            Cognition Arousal/Alertness: Awake/alert Behavior During Therapy: Anxious Overall Cognitive Status: Within Functional Limits for tasks assessed                                        Exercises Total Joint Exercises Ankle Circles/Pumps: Strengthening;Both;20 reps;Seated Gluteal Sets: Strengthening;Both;10 reps;Seated Long Arc Quad: Strengthening;Both;10 reps;Seated Other Exercises Other Exercises: Anterior trunk weight shifting in the chair with use of B UE's to assist x 10 reps.     General Comments        Pertinent Vitals/Pain Pain Location: L hip and  lower back - crying out in pain during all mobility, and performing valsalva.   Pain Intervention(s): Limited activity within patient's tolerance;Monitored during session;Premedicated before session;Repositioned    Home Living                      Prior Function            PT Goals (current goals can now be found in the care plan section) Acute Rehab PT Goals Patient Stated Goal: pt wants to get stronger and get back home.  PT Goal Formulation: With patient Time For Goal Achievement: 06/21/16 Potential to Achieve Goals: Fair Progress towards PT goals: Progressing toward goals    Frequency    7X/week      PT Plan Current plan remains appropriate    Co-evaluation             End of Session Equipment Utilized During Treatment: Oxygen;Other (comment) (Maxi move) Activity Tolerance: Patient limited by pain Patient left: in bed;with call  bell/phone within reach;with family/visitor present Nurse Communication: Mobility status;Need for lift equipment (Maxi move sling left under pt in the chair. ) PT Visit Diagnosis: Other abnormalities of gait and mobility (R26.89);Muscle weakness (generalized) (M62.81);History of falling (Z91.81);Pain Pain - Right/Left: Left Pain - part of body: Hip     Time: 1022-1100 PT Time Calculation (min) (ACUTE ONLY): 38 min  Charges:  $Therapeutic Exercise: 8-22 mins $Therapeutic Activity: 23-37 mins                    G Codes:       Beth Miriana Gaertner, PT, DPT X: [phone]

## 2016-06-08 NOTE — Progress Notes (Signed)
Patient ID: Barbara Lucero, female   DOB: 10-08-1940, 76 y.o.   MRN: [id number] BP (!) 137/54 (BP Location: Right Wrist)   Pulse 100   Temp 99.3 F (37.4 C) (Oral)   Resp 20   Ht 5\' 6"  (1.676 m)   Wt 226 lb (102.5 kg)   SpO2 92%   BMI 36.48 kg/m   CBC Latest Ref Rng & Units 06/08/2016 06/07/2016 06/05/2016  WBC 4.0 - 10.5 K/uL 13.0(H) 13.3(H) 10.7(H)  Hemoglobin 12.0 - 15.0 g/dL 11.3(L) 11.8(L) 13.5  Hematocrit 36.0 - 46.0 % 35.5(L) 37.8 42.1  Platelets 150 - 400 K/uL 142(L) 168 166   BMP Latest Ref Rng & Units 06/08/2016 06/07/2016 06/05/2016  Glucose 65 - 99 mg/dL 332(R) 518(A) 416(S)  BUN 6 - 20 mg/dL 8 10 13   Creatinine 0.44 - 1.00 mg/dL 0.63 0.16 0.10  Sodium 135 - 145 mmol/L 134(L) 136 136  Potassium 3.5 - 5.1 mmol/L 3.8 3.9 3.6  Chloride 101 - 111 mmol/L 94(L) 97(L) 98(L)  CO2 22 - 32 mmol/L 35(H) 35(H) 32  Calcium 8.9 - 10.3 mg/dL 8.0(L) 8.1(L) 8.6(L)   Looks good today   At discharge   Hip instructions for bipolar prosthesis  Procedure bipolar hip replacement for hip fracture with [place] basic hip stem  The patient is weightbearing as tolerated DVT prevention aspirin for 28 days postop Staples are to be removed on postop day 12 Hip precautions for direct lateral approach Followup visit POD 12-17

## 2016-06-08 NOTE — Progress Notes (Signed)
Subjective: She's awake and alert infant in mild distress. She is complaining of pain in her hip which had the surgery. She has no other new complaints. Her breathing is okay. No nausea vomiting diarrhea or chest pain.  Objective: Vital signs in last 24 hours: Temp:  [98 F (36.7 C)-99.3 F (37.4 C)] 99.3 F (37.4 C) (04/15 0614) Pulse Rate:  [100-143] 100 (04/15 0614) Resp:  [20] 20 (04/15 0614) BP: (108-137)/(54-65) 137/54 (04/15 0614) SpO2:  [91 %-93 %] 92 % (04/15 0614) Weight change:     Intake/Output from previous day: 04/14 0701 - 04/15 0700 In: 2288.8 [P.O.:360; I.V.:1928.8] Out: 1150 [Urine:1150]  PHYSICAL EXAM General appearance: alert, cooperative and mild distress Resp: clear to auscultation bilaterally Cardio: regular rate and rhythm, S1, S2 normal, no murmur, click, rub or gallop GI: soft, non-tender; bowel sounds normal; no masses,  no organomegaly Extremities: extremities normal, atraumatic, no cyanosis or edema Skin warm and dry  Lab Results:  Results for orders placed or performed during the hospital encounter of 06/05/16 (from the past 48 hour(s))  Glucose, capillary     Status: Abnormal   Collection Time: 06/06/16 12:26 PM  Result Value Ref Range   Glucose-Capillary 125 (H) 65 - 99 mg/dL  Glucose, capillary     Status: Abnormal   Collection Time: 06/06/16  3:32 PM  Result Value Ref Range   Glucose-Capillary 124 (H) 65 - 99 mg/dL  CBC     Status: Abnormal   Collection Time: 06/07/16  6:16 AM  Result Value Ref Range   WBC 13.3 (H) 4.0 - 10.5 K/uL   RBC 4.01 3.87 - 5.11 MIL/uL   Hemoglobin 11.8 (L) 12.0 - 15.0 g/dL   HCT 37.8 36.0 - 46.0 %   MCV 94.3 78.0 - 100.0 fL   MCH 29.4 26.0 - 34.0 pg   MCHC 31.2 30.0 - 36.0 g/dL   RDW 13.9 11.5 - 15.5 %   Platelets 168 150 - 400 K/uL  Basic metabolic panel     Status: Abnormal   Collection Time: 06/07/16  6:16 AM  Result Value Ref Range   Sodium 136 135 - 145 mmol/L   Potassium 3.9 3.5 - 5.1 mmol/L   Chloride 97 (L) 101 - 111 mmol/L   CO2 35 (H) 22 - 32 mmol/L   Glucose, Bld 147 (H) 65 - 99 mg/dL   BUN 10 6 - 20 mg/dL   Creatinine, Ser 0.62 0.44 - 1.00 mg/dL   Calcium 8.1 (L) 8.9 - 10.3 mg/dL   GFR calc non Af Amer >60 >60 mL/min   GFR calc Af Amer >60 >60 mL/min    Comment: (NOTE) The eGFR has been calculated using the CKD EPI equation. This calculation has not been validated in all clinical situations. eGFR's persistently <60 mL/min signify possible Chronic Kidney Disease.    Anion gap 4 (L) 5 - 15  CBC     Status: Abnormal   Collection Time: 06/08/16  6:33 AM  Result Value Ref Range   WBC 13.0 (H) 4.0 - 10.5 K/uL   RBC 3.79 (L) 3.87 - 5.11 MIL/uL   Hemoglobin 11.3 (L) 12.0 - 15.0 g/dL   HCT 35.5 (L) 36.0 - 46.0 %   MCV 93.7 78.0 - 100.0 fL   MCH 29.8 26.0 - 34.0 pg   MCHC 31.8 30.0 - 36.0 g/dL   RDW 13.8 11.5 - 15.5 %   Platelets 142 (L) 150 - 400 K/uL  Basic metabolic panel  Status: Abnormal   Collection Time: 06/08/16  6:33 AM  Result Value Ref Range   Sodium 134 (L) 135 - 145 mmol/L   Potassium 3.8 3.5 - 5.1 mmol/L   Chloride 94 (L) 101 - 111 mmol/L   CO2 35 (H) 22 - 32 mmol/L   Glucose, Bld 145 (H) 65 - 99 mg/dL   BUN 8 6 - 20 mg/dL   Creatinine, Ser 0.58 0.44 - 1.00 mg/dL   Calcium 8.0 (L) 8.9 - 10.3 mg/dL   GFR calc non Af Amer >60 >60 mL/min   GFR calc Af Amer >60 >60 mL/min    Comment: (NOTE) The eGFR has been calculated using the CKD EPI equation. This calculation has not been validated in all clinical situations. eGFR's persistently <60 mL/min signify possible Chronic Kidney Disease.    Anion gap 5 5 - 15    ABGS No results for input(s): PHART, PO2ART, TCO2, HCO3 in the last 72 hours.  Invalid input(s): PCO2 CULTURES Recent Results (from the past 240 hour(s))  Urine culture     Status: None   Collection Time: 06/05/16 10:07 AM  Result Value Ref Range Status   Specimen Description URINE, CATHETERIZED  Final   Special Requests Normal  Final    Culture   Final    NO GROWTH Performed at [facility], [address] [place], [place] [address]    Report Status 06/07/2016 FINAL  Final  Surgical pcr screen     Status: None   Collection Time: 06/05/16 10:45 PM  Result Value Ref Range Status   MRSA, PCR NEGATIVE NEGATIVE Final   Staphylococcus aureus NEGATIVE NEGATIVE Final    Comment:        The Xpert SA Assay (FDA approved for NASAL specimens in patients over 93 years of age), is one component of a comprehensive surveillance program.  Test performance has been validated by [facility] for patients greater than or equal to 44 year old. It is not intended to diagnose infection nor to guide or monitor treatment.    Studies/Results: Dg Pelvis 1-2 Views  Result Date: 06/06/2016 CLINICAL DATA:  Postop. EXAM: PELVIS - 1-2 VIEW COMPARISON:  06/05/2016. FINDINGS: Satisfactory appearance status post LEFT hip bipolar hemiarthroplasty. This was performed for a LEFT femoral neck fracture. No adverse features. IMPRESSION: As above. Electronically Signed   By: Staci Righter M.D.   On: 06/06/2016 16:20    Medications:  Prior to Admission:  Prescriptions Prior to Admission  Medication Sig Dispense Refill Last Dose  . acetaminophen (TYLENOL) 650 MG CR tablet Take 650 mg by mouth every 8 (eight) hours as needed for pain.   06/05/2016 at Unknown time  . Ascorbic Acid (VITAMIN C) 1000 MG tablet Take 1,000 mg by mouth daily.   06/05/2016 at Unknown time  . b complex vitamins tablet Take 1 tablet by mouth daily.   06/05/2016 at Unknown time  . doxepin (SINEQUAN) 50 MG capsule Take 50 mg by mouth at bedtime.    06/05/2016 at Unknown time  . gabapentin (NEURONTIN) 300 MG capsule Take 300 mg by mouth 3 (three) times daily as needed (pain).    06/05/2016 at Unknown time  . ibuprofen (ADVIL,MOTRIN) 200 MG tablet Take 200 mg by mouth every 6 (six) hours as needed for moderate pain.   06/05/2016 at Unknown time  . Multiple Vitamin (MULTIVITAMIN)  tablet Take 1 tablet by mouth daily.   06/05/2016 at Unknown time  . psyllium (METAMUCIL) 58.6 % packet  Take 1 packet by mouth at bedtime.   06/05/2016 at Unknown time  . sulfaSALAzine (AZULFIDINE) 500 MG tablet Take 500 mg by mouth 2 (two) times daily.    06/05/2016 at Unknown time  . vitamin E 400 UNIT capsule Take 400 Units by mouth daily.   06/05/2016 at Unknown time   Scheduled: . aspirin EC  325 mg Oral Q breakfast  . doxepin  50 mg Oral QHS  . psyllium  1 packet Oral QHS  . sulfaSALAzine  500 mg Oral BID   Continuous: . sodium chloride 75 mL/hr at 06/07/16 1440   FHQ:[id number] **OR** acetaminophen, acetaminophen, gabapentin, HYDROcodone-acetaminophen, menthol-cetylpyridinium **OR** phenol, metoCLOPramide **OR** metoCLOPramide (REGLAN) injection, morphine injection, ondansetron **OR** ondansetron (ZOFRAN) IV, ondansetron **OR** ondansetron (ZOFRAN) IV  Assesment: She was admitted with a hip fracture. She has had surgery and it has been recommended that she go to a skilled care facility and she agrees to do that. At baseline she has had trouble with anxiety and depression which are pretty well controlled on current medications and Crohn's disease which is stable. Active Problems:   Hip fracture ([place])    Plan: Continue treatments. Potential transfer to skilled care facility in the next 24-48 hours    LOS: 3 days   Danelly Hassinger L 06/08/2016, 10:15 AM

## 2016-06-09 DIAGNOSIS — M6281 Muscle weakness (generalized): Secondary | ICD-10-CM | POA: Diagnosis not present

## 2016-06-09 DIAGNOSIS — Z7982 Long term (current) use of aspirin: Secondary | ICD-10-CM | POA: Diagnosis not present

## 2016-06-09 DIAGNOSIS — R0902 Hypoxemia: Secondary | ICD-10-CM | POA: Diagnosis not present

## 2016-06-09 DIAGNOSIS — K5090 Crohn's disease, unspecified, without complications: Secondary | ICD-10-CM | POA: Diagnosis not present

## 2016-06-09 DIAGNOSIS — J9611 Chronic respiratory failure with hypoxia: Secondary | ICD-10-CM | POA: Diagnosis not present

## 2016-06-09 DIAGNOSIS — Z9181 History of falling: Secondary | ICD-10-CM | POA: Diagnosis not present

## 2016-06-09 DIAGNOSIS — R279 Unspecified lack of coordination: Secondary | ICD-10-CM | POA: Diagnosis not present

## 2016-06-09 DIAGNOSIS — R293 Abnormal posture: Secondary | ICD-10-CM | POA: Diagnosis not present

## 2016-06-09 DIAGNOSIS — R0602 Shortness of breath: Secondary | ICD-10-CM | POA: Diagnosis present

## 2016-06-09 DIAGNOSIS — M1990 Unspecified osteoarthritis, unspecified site: Secondary | ICD-10-CM | POA: Diagnosis not present

## 2016-06-09 DIAGNOSIS — S72091A Other fracture of head and neck of right femur, initial encounter for closed fracture: Secondary | ICD-10-CM | POA: Diagnosis not present

## 2016-06-09 DIAGNOSIS — R069 Unspecified abnormalities of breathing: Secondary | ICD-10-CM | POA: Diagnosis not present

## 2016-06-09 LAB — CBC
HCT: 34.9 % — ABNORMAL LOW (ref 36.0–46.0)
Hemoglobin: 11 g/dL — ABNORMAL LOW (ref 12.0–15.0)
MCH: 29.3 pg (ref 26.0–34.0)
MCHC: 31.5 g/dL (ref 30.0–36.0)
MCV: 92.8 fL (ref 78.0–100.0)
PLATELETS: 153 10*3/uL (ref 150–400)
RBC: 3.76 MIL/uL — AB (ref 3.87–5.11)
RDW: 13.7 % (ref 11.5–15.5)
WBC: 11.1 10*3/uL — AB (ref 4.0–10.5)

## 2016-06-09 LAB — TYPE AND SCREEN
ABO/RH(D): A NEG
ANTIBODY SCREEN: NEGATIVE
UNIT DIVISION: 0
UNIT DIVISION: 0

## 2016-06-09 LAB — BPAM RBC
BLOOD PRODUCT EXPIRATION DATE: 201804302359
Blood Product Expiration Date: 201804302359
UNIT TYPE AND RH: 600
Unit Type and Rh: 600

## 2016-06-09 MED ORDER — BISACODYL 10 MG RE SUPP
10.0000 mg | Freq: Once | RECTAL | Status: AC
  Administered 2016-06-09: 10 mg via RECTAL
  Filled 2016-06-09: qty 1

## 2016-06-09 MED ORDER — ASPIRIN 325 MG PO TBEC: 325.0000 mg | DELAYED_RELEASE_TABLET | Freq: Every day | ORAL | 0 refills | Status: DC

## 2016-06-09 MED ORDER — HYDROCODONE-ACETAMINOPHEN 5-325 MG PO TABS: 1.0000 | ORAL_TABLET | Freq: Four times a day (QID) | ORAL | 0 refills | Status: DC | PRN

## 2016-06-09 MED ORDER — ONDANSETRON HCL 4 MG PO TABS: 4.0000 mg | ORAL_TABLET | Freq: Four times a day (QID) | ORAL | 0 refills | Status: DC | PRN

## 2016-06-09 NOTE — Progress Notes (Signed)
Subjective: She says she feels okay. She is still having pain in her hip but this is slowly improving. She's working with physical therapy and has been recommended that she go to skilled care facility for rehabilitation  Objective: Vital signs in last 24 hours: Temp:  [98.3 F (36.8 C)-99.2 F (37.3 C)] 99.1 F (37.3 C) (04/16 0627) Pulse Rate:  [92-103] 92 (04/16 0627) Resp:  [18] 18 (04/16 0627) BP: (127-139)/(55-64) 127/55 (04/16 0627) SpO2:  [90 %-94 %] 94 % (04/16 0627) Weight change:     Intake/Output from previous day: 04/15 0701 - 04/16 0700 In: 1042.5 [P.O.:240; I.V.:802.5] Out: 1000 [Urine:1000]  PHYSICAL EXAM General appearance: alert, cooperative and mild distress Resp: clear to auscultation bilaterally Cardio: regular rate and rhythm, S1, S2 normal, no murmur, click, rub or gallop GI: soft, non-tender; bowel sounds normal; no masses,  no organomegaly Extremities: The surgical site looks okay and is dry Skin warm and dry.  Lab Results:  Results for orders placed or performed during the hospital encounter of 06/05/16 (from the past 48 hour(s))  CBC     Status: Abnormal   Collection Time: 06/08/16  6:33 AM  Result Value Ref Range   WBC 13.0 (H) 4.0 - 10.5 K/uL   RBC 3.79 (L) 3.87 - 5.11 MIL/uL   Hemoglobin 11.3 (L) 12.0 - 15.0 g/dL   HCT 35.5 (L) 36.0 - 46.0 %   MCV 93.7 78.0 - 100.0 fL   MCH 29.8 26.0 - 34.0 pg   MCHC 31.8 30.0 - 36.0 g/dL   RDW 13.8 11.5 - 15.5 %   Platelets 142 (L) 150 - 400 K/uL  Basic metabolic panel     Status: Abnormal   Collection Time: 06/08/16  6:33 AM  Result Value Ref Range   Sodium 134 (L) 135 - 145 mmol/L   Potassium 3.8 3.5 - 5.1 mmol/L   Chloride 94 (L) 101 - 111 mmol/L   CO2 35 (H) 22 - 32 mmol/L   Glucose, Bld 145 (H) 65 - 99 mg/dL   BUN 8 6 - 20 mg/dL   Creatinine, Ser 0.58 0.44 - 1.00 mg/dL   Calcium 8.0 (L) 8.9 - 10.3 mg/dL   GFR calc non Af Amer >60 >60 mL/min   GFR calc Af Amer >60 >60 mL/min    Comment:  (NOTE) The eGFR has been calculated using the CKD EPI equation. This calculation has not been validated in all clinical situations. eGFR's persistently <60 mL/min signify possible Chronic Kidney Disease.    Anion gap 5 5 - 15  CBC     Status: Abnormal   Collection Time: 06/09/16  6:04 AM  Result Value Ref Range   WBC 11.1 (H) 4.0 - 10.5 K/uL   RBC 3.76 (L) 3.87 - 5.11 MIL/uL   Hemoglobin 11.0 (L) 12.0 - 15.0 g/dL   HCT 34.9 (L) 36.0 - 46.0 %   MCV 92.8 78.0 - 100.0 fL   MCH 29.3 26.0 - 34.0 pg   MCHC 31.5 30.0 - 36.0 g/dL   RDW 13.7 11.5 - 15.5 %   Platelets 153 150 - 400 K/uL    ABGS No results for input(s): PHART, PO2ART, TCO2, HCO3 in the last 72 hours.  Invalid input(s): PCO2 CULTURES Recent Results (from the past 240 hour(s))  Urine culture     Status: None   Collection Time: 06/05/16 10:07 AM  Result Value Ref Range Status   Specimen Description URINE, CATHETERIZED  Final   Special Requests Normal  Final   Culture   Final    NO GROWTH Performed at [facility], [place] [address], [place], [place] [address]    Report Status 06/07/2016 FINAL  Final  Surgical pcr screen     Status: None   Collection Time: 06/05/16 10:45 PM  Result Value Ref Range Status   MRSA, PCR NEGATIVE NEGATIVE Final   Staphylococcus aureus NEGATIVE NEGATIVE Final    Comment:        The Xpert SA Assay (FDA approved for NASAL specimens in patients over 74 years of age), is one component of a comprehensive surveillance program.  Test performance has been validated by [facility] for patients greater than or equal to 8 year old. It is not intended to diagnose infection nor to guide or monitor treatment.    Studies/Results: No results found.  Medications:  Prior to Admission:  Prescriptions Prior to Admission  Medication Sig Dispense Refill Last Dose  . acetaminophen (TYLENOL) 650 MG CR tablet Take 650 mg by mouth every 8 (eight) hours as needed for pain.   06/05/2016 at Unknown  time  . Ascorbic Acid (VITAMIN C) 1000 MG tablet Take 1,000 mg by mouth daily.   06/05/2016 at Unknown time  . b complex vitamins tablet Take 1 tablet by mouth daily.   06/05/2016 at Unknown time  . doxepin (SINEQUAN) 50 MG capsule Take 50 mg by mouth at bedtime.    06/05/2016 at Unknown time  . gabapentin (NEURONTIN) 300 MG capsule Take 300 mg by mouth 3 (three) times daily as needed (pain).    06/05/2016 at Unknown time  . ibuprofen (ADVIL,MOTRIN) 200 MG tablet Take 200 mg by mouth every 6 (six) hours as needed for moderate pain.   06/05/2016 at Unknown time  . Multiple Vitamin (MULTIVITAMIN) tablet Take 1 tablet by mouth daily.   06/05/2016 at Unknown time  . psyllium (METAMUCIL) 58.6 % packet Take 1 packet by mouth at bedtime.   06/05/2016 at Unknown time  . sulfaSALAzine (AZULFIDINE) 500 MG tablet Take 500 mg by mouth 2 (two) times daily.    06/05/2016 at Unknown time  . vitamin E 400 UNIT capsule Take 400 Units by mouth daily.   06/05/2016 at Unknown time   Scheduled: . aspirin EC  325 mg Oral Q breakfast  . doxepin  50 mg Oral QHS  . psyllium  1 packet Oral QHS  . sulfaSALAzine  500 mg Oral BID   Continuous: . sodium chloride 75 mL/hr at 06/09/16 0617   XJO:[id number] **OR** acetaminophen, acetaminophen, gabapentin, HYDROcodone-acetaminophen, menthol-cetylpyridinium **OR** phenol, metoCLOPramide **OR** metoCLOPramide (REGLAN) injection, morphine injection, ondansetron **OR** ondansetron (ZOFRAN) IV, ondansetron **OR** ondansetron (ZOFRAN) IV  Assesment: she has ad a hip re. It has been recommended that she go to skilled care facility. Arrangements are being made. She has anxiety and depression at baseline which seems stable. She has Crohn's disease which is doing okay Active Problems:   Hip fracture ([place])   Anxiety state   Depression   Crohn's disease ([place])    Plan: As above    LOS: 4 days   Xoe Hoe L 06/09/2016, 8:27 AM

## 2016-06-09 NOTE — Evaluation (Signed)
Occupational Therapy Evaluation Patient Details Name: Barbara Lucero MRN: [id number] DOB: 1940-06-20 Today's Date: 06/09/2016    History of Present Illness 76 y.o. female with medical history significant of arthritis and crohn's disease presents with a fall, mechanical fall. and was found to have a hip fracture on the left. She reports pain in the left hip. On arrival to ED, she was found to be hypoxic and cxr does not show any acute pathology. I ordered CT chest, shows some basilar atelectasis. Lab work was unremarkable. She is referred to medical service for admission and orthopedics consulted. She denies any complaints other than left hip pain.  Pt is now s/p open treatment internal fixation left hip fracture with bipolar prosthesis left hip, Lateral hip precautions, and WBAT.     Clinical Impression   Patient and husband present upon therapy entry in room. Patient was agreeable to participate in therapy session. Patient presents with increased anxiety with all mobility tasks, decreased strength and endurance requiring increased assistance for all daily tasks. Recommend discharge to SNF at discharge.   Follow Up Recommendations  SNF    Equipment Recommendations  None recommended by OT       Precautions / Restrictions Precautions Precautions: Fall Precaution Comments: Lateral Hip Precautions.  Fall with L hip fx is reason for admission. Restrictions Weight Bearing Restrictions: Yes LLE Weight Bearing: Weight bearing as tolerated      Mobility Bed Mobility Overal bed mobility: Needs Assistance Bed Mobility: Rolling Rolling: Max assist;+2 for physical assistance;+2 for safety/equipment   Supine to sit: Total assist;HOB elevated        Transfers Overall transfer level: Needs assistance Equipment used: Rolling walker (2 wheeled) Transfers: Sit to/from Stand Sit to Stand: Max assist;+2 physical assistance         General transfer comment: Maxi move for transfer bed<>chair.   Once in the chair, pt required increased assistance and cues for forward scoot.  Pt required vc's for safe hand placement with sit<>stand.  She demonstrates significant internal rotation and at the knee when standing and she is essentially bearing weight through the medial aspect of her L foot.  Pt states that it normally goes out because she is flat footed.  Pt was able to stand for ~3 min while getting buttocks and peri-area cleansed after foley catheter was removed.      Balance Overall balance assessment: History of Falls;Needs assistance Sitting-balance support: Feet supported;Bilateral upper extremity supported Sitting balance-Leahy Scale: Poor Sitting balance - Comments: Increased assistance and vc's for anterior weight shift while sitting on the edge of the chair.  Educated pt on proper positioning to prepare for standing.     Standing balance support: Bilateral upper extremity supported Standing balance-Leahy Scale: Poor Standing balance comment: Pt initially requires max A +2 for static standing balance, however with cues to push through her UE's, she was able to stand with Min A.                           ADL either performed or assessed with clinical judgement   ADL Overall ADL's : Needs assistance/impaired                     Lower Body Dressing: Total assistance;Bed level Lower Body Dressing Details (indicate cue type and reason): donning hospital socks             Functional mobility during ADLs: Total assistance  Vision Baseline Vision/History: No visual deficits Patient Visual Report: No change from baseline              Pertinent Vitals/Pain Pain Assessment: 0-10 Pain Score: 9  Pain Location: L hip and lower back - crying out in pain during all mobility, and performing valsalva.   Pain Descriptors / Indicators: Aching Pain Intervention(s): Monitored during session;Limited activity within patient's tolerance;Relaxation;RN gave pain  meds during session     Hand Dominance Right   Extremity/Trunk Assessment Upper Extremity Assessment Upper Extremity Assessment: Generalized weakness   Lower Extremity Assessment Lower Extremity Assessment: Defer to PT evaluation       Communication Communication Communication: No difficulties   Cognition Arousal/Alertness: Awake/alert Behavior During Therapy: Anxious Overall Cognitive Status: Within Functional Limits for tasks assessed                                                Home Living Family/patient expects to be discharged to:: Skilled nursing facility Living Arrangements: Spouse/significant other;Children Available Help at Discharge: Available 24 hours/day Type of Home: House Home Access: Ramped entrance     Home Layout: One level     Bathroom Shower/Tub: Other (comment) (walk in tub)   Bathroom Toilet: Handicapped height     Home Equipment: Cane - single point;Walker - 4 wheels (sleeps in lift chair)          Prior Functioning/Environment Level of Independence: Independent with assistive device(s)  Gait / Transfers Assistance Needed: Pt states she is independent with ambulation at home, but admits to furniture walking.  Still driving, comunity ambulator with a cane.   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  She states that he has to change his dressings for his wounds, and help to flush his catheter, etc.  She states that her granddaughter helps, but the others in the household do not.                   OT Goals(Current goals can be found in the care plan section) Acute Rehab OT Goals Patient Stated Goal: pt wants to get stronger and get back home.   OT Frequency:             Co-evaluation PT/OT/SLP Co-Evaluation/Treatment: Yes Reason for Co-Treatment: For patient/therapist  safety;To address functional/ADL transfers;Complexity of the patient's impairments (multi-system involvement) PT goals addressed during session: Mobility/safety with mobility;Proper use of DME;Balance OT goals addressed during session: Strengthening/ROM;ADL's and self-care      End of Session Equipment Utilized During Treatment: Gait belt;Rolling walker;Other (comment);Oxygen ([place])  Activity Tolerance: Patient limited by pain Patient left: in chair;with call bell/phone within reach;with family/visitor present  OT Visit Diagnosis: Muscle weakness (generalized) (M62.81)                Time: 4098-1191 OT Time Calculation (min): 46 min Charges:  OT General Charges $OT Visit: 1 Procedure OT Evaluation $OT Eval Low Complexity: 1 Procedure G-Codes:     Limmie Patricia, OTR/L,CBIS  [phone]   Cheyenne Schumm, Charisse March 06/09/2016, 9:49 AM

## 2016-06-09 NOTE — Progress Notes (Signed)
Physical Therapy Treatment Patient Details Name: Barbara Lucero MRN: [id number] DOB: 06-19-1940 Today's Date: 06/09/2016    History of Present Illness 76 y.o. female with medical history significant of arthritis and crohn's disease presents with a fall, mechanical fall. and was found to have a hip fracture on the left. She reports pain in the left hip. On arrival to ED, she was found to be hypoxic and cxr does not show any acute pathology. I ordered CT chest, shows some basilar atelectasis. Lab work was unremarkable. She is referred to medical service for admission and orthopedics consulted. She denies any complaints other than left hip pain.  Pt is now s/p open treatment internal fixation left hip fracture with bipolar prosthesis left hip, Lateral hip precautions, and WBAT.      PT Comments    Pt received in bed, husband present, and pt is agreeable to PT tx.  Pt continues to have great difficulty with bed mobility - pt does not sleep in a bed at home, she sleeps in a lift chair.  Total A via Maxi move for pt to transfer bed<>chair.  Pt then requires Assist for anterior scooting and weight shift while sitting in the chair.  She required Max A +2 person for sit<>stand.  Noted bloody discharge after recent foley catheter removal, and Megan, RN notified.  Pt was able to stand for ~3 min for peri-care and improved balance to only requiring Min A.  She required max A for stand<>sit due to poor eccentric control.  Continue to recommend SNF.    Follow Up Recommendations  SNF     Equipment Recommendations  None recommended by PT    Recommendations for Other Services       Precautions / Restrictions Precautions Precautions: Fall Precaution Comments: Lateral Hip Precautions.  Fall with L hip fx is reason for admission. Restrictions Weight Bearing Restrictions: Yes LLE Weight Bearing: Weight bearing as tolerated    Mobility  Bed Mobility Overal bed mobility: Needs Assistance Bed Mobility:  Rolling Rolling: Max assist;+2 for physical assistance;+2 for safety/equipment            Transfers Overall transfer level: Needs assistance Equipment used: Rolling walker (2 wheeled) Transfers: Sit to/from Stand Sit to Stand: Max assist;+2 physical assistance         General transfer comment: Maxi move for transfer bed<>chair.  Once in the chair, pt required increased assistance and cues for forward scoot.  Pt required vc's for safe hand placement with sit<>stand.  She demonstrates significant internal rotation and at the knee when standing and she is essentially bearing weight through the medial aspect of her L foot.  Pt states that it normally goes out because she is flat footed.  Pt was able to stand for ~3 min while getting buttocks and peri-area cleansed after foley catheter was removed.    Ambulation/Gait Ambulation/Gait assistance:  (NA - pt is not able to weight shift while in standing)               Stairs            Wheelchair Mobility    Modified Rankin (Stroke Patients Only)       Balance Overall balance assessment: History of Falls;Needs assistance Sitting-balance support: Feet supported;Bilateral upper extremity supported Sitting balance-Leahy Scale: Poor Sitting balance - Comments: Increased assistance and vc's for anterior weight shift while sitting on the edge of the chair.  Educated pt on proper positioning to prepare for standing.  Standing balance support: Bilateral upper extremity supported Standing balance-Leahy Scale: Poor Standing balance comment: Pt initially requires max A +2 for static standing balance, however with cues to push through her UE's, she was able to stand with Min A.                            Cognition Arousal/Alertness: Awake/alert Behavior During Therapy: Anxious Overall Cognitive Status: Within Functional Limits for tasks assessed                                        Exercises       General Comments        Pertinent Vitals/Pain Pain Assessment: 0-10 Pain Score: 9  Pain Location: L hip and lower back - crying out in pain during all mobility, and performing valsalva.   Pain Descriptors / Indicators: Aching Pain Intervention(s): Limited activity within patient's tolerance;Monitored during session;Repositioned    Home Living                      Prior Function            PT Goals (current goals can now be found in the care plan section) Acute Rehab PT Goals Patient Stated Goal: pt wants to get stronger and get back home.  PT Goal Formulation: With patient Time For Goal Achievement: 06/21/16 Potential to Achieve Goals: Fair Progress towards PT goals: Progressing toward goals    Frequency    7X/week      PT Plan Current plan remains appropriate    Co-evaluation PT/OT/SLP Co-Evaluation/Treatment: Yes Reason for Co-Treatment: Complexity of the patient's impairments (multi-system involvement);Necessary to address cognition/behavior during functional activity;For patient/therapist safety PT goals addressed during session: Mobility/safety with mobility;Proper use of DME;Balance       End of Session Equipment Utilized During Treatment: Oxygen;Other (comment);Gait belt (Maxi move) Activity Tolerance: Patient limited by pain Patient left: with call bell/phone within reach;with family/visitor present;in chair Nurse Communication: Mobility status;Need for lift equipment (maxi move sling left under pt in the chair. ) PT Visit Diagnosis: Other abnormalities of gait and mobility (R26.89);Muscle weakness (generalized) (M62.81);History of falling (Z91.81);Pain Pain - Right/Left: Left Pain - part of body: Hip     Time: 2993-7169 PT Time Calculation (min) (ACUTE ONLY): 46 min  Charges:  $Therapeutic Activity: 23-37 mins                    G Codes:  Functional Assessment Tool Used: AM-PAC 6 Clicks Basic Mobility    Beth Micael Barb, PT, DPT X:  [phone]

## 2016-06-09 NOTE — Progress Notes (Signed)
PT reported bloody discharge when getting patient OOB to chair this AM.  When administering rectal suppository, no blood noted.  Small stain of tan, mucus-like discharge noted, though.  Appears to be vaginal.  Patient states she does have pessary in place.  Patient denies any discomfort.  MD made aware - order for culture of vaginal fluid.

## 2016-06-09 NOTE — NC FL2 (Signed)
[place] MEDICAID FL2 LEVEL OF CARE SCREENING TOOL     IDENTIFICATION  Patient Name: Barbara Lucero Birthdate: [birth year] Sex: female Admission Date (Current Location): 06/05/2016  [facility] and [place] Number:  [employer] and Address:  [facility],  [address] [place] [address]      Provider Number: [id number]  Attending Physician Name and Address:  Kari Baars, MD  Relative Name and Phone Number:       Current Level of Care: Hospital Recommended Level of Care: Skilled Nursing Facility Prior Approval Number:    Date Approved/Denied:   PASRR Number:   [id number]   Discharge Plan: SNF    Current Diagnoses: Patient Active Problem List   Diagnosis Date Noted  . Anxiety state 06/08/2016  . Depression 06/08/2016  . Crohn's disease (HCC) 06/08/2016  . Hip fracture (HCC) 06/05/2016  . 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 Foot fracture 12/15/2013  . Metatarsal fracture 11/05/2013    Orientation RESPIRATION BLADDER Height & Weight     Self, Situation, Place  O2 (2L) Continent Weight: 226 lb (102.5 kg) Height:  5\' 6"  (167.6 cm)  BEHAVIORAL SYMPTOMS/MOOD NEUROLOGICAL BOWEL NUTRITION STATUS      Continent Diet (see DC summary)  AMBULATORY STATUS COMMUNICATION OF NEEDS Skin   Extensive Assist Verbally Surgical wounds, Skin abrasions, Bruising                       Personal Care Assistance Level of Assistance  Bathing, Feeding, Dressing Bathing Assistance: Maximum assistance Feeding assistance: Independent Dressing Assistance: Maximum assistance     Functional Limitations Info  Sight, Hearing, Speech Sight Info: Adequate Hearing Info: Adequate Speech Info: Adequate    SPECIAL CARE FACTORS FREQUENCY  PT (By licensed PT), OT (By licensed OT)     PT Frequency: 5x a week OT Frequency: 5x a week            Contractures Contractures Info: Not present    Additional Factors Info  Code Status, Allergies Code Status Info: Full  Code Allergies Info: NKA           Current Medications (06/09/2016):  This is the current hospital active medication list Current Facility-Administered Medications  Medication Dose Route Frequency Provider Last Rate Last Dose  . 0.9 %  sodium chloride infusion   Intravenous Continuous Kathlen Mody, MD 75 mL/hr at 06/09/16 0617    . acetaminophen (TYLENOL) tablet 650 mg  650 mg Oral Q6H PRN Vickki Hearing, MD       Or  . acetaminophen (TYLENOL) suppository 650 mg  650 mg Rectal Q6H PRN Vickki Hearing, MD      . acetaminophen (TYLENOL) tablet 650 mg  650 mg Oral Q8H PRN Kathlen Mody, MD      . aspirin EC tablet 325 mg  325 mg Oral Q breakfast Vickki Hearing, MD   325 mg at 06/09/16 0957  . doxepin (SINEQUAN) capsule 50 mg  50 mg Oral QHS Kathlen Mody, MD   50 mg at 06/08/16 2228  . gabapentin (NEURONTIN) capsule 300 mg  300 mg Oral TID PRN Kathlen Mody, MD   300 mg at 06/07/16 1101  . HYDROcodone-acetaminophen (NORCO/VICODIN) 5-325 MG per tablet 1-2 tablet  1-2 tablet Oral Q6H PRN Vickki Hearing, MD   2 tablet at 06/08/16 [phone]  . menthol-cetylpyridinium (CEPACOL) lozenge 3 mg  1 lozenge Oral PRN Vickki Hearing, MD       Or  .  phenol (CHLORASEPTIC) mouth spray 1 spray  1 spray Mouth/Throat PRN Vickki Hearing, MD      . metoCLOPramide (REGLAN) tablet 5-10 mg  5-10 mg Oral Q8H PRN Vickki Hearing, MD       Or  . metoCLOPramide (REGLAN) injection 5-10 mg  5-10 mg Intravenous Q8H PRN Vickki Hearing, MD      . morphine 2 MG/ML injection 1-2 mg  1-2 mg Intravenous Q4H PRN Kathlen Mody, MD   2 mg at 06/09/16 0903  . ondansetron (ZOFRAN) tablet 4 mg  4 mg Oral Q6H PRN Kathlen Mody, MD       Or  . ondansetron (ZOFRAN) injection 4 mg  4 mg Intravenous Q6H PRN Kathlen Mody, MD   4 mg at 06/06/16 1959  . ondansetron (ZOFRAN) tablet 4 mg  4 mg Oral Q6H PRN Vickki Hearing, MD       Or  . ondansetron [facility]) injection 4 mg  4 mg Intravenous Q6H PRN Vickki Hearing,  MD      . psyllium (HYDROCIL/METAMUCIL) packet 1 packet  1 packet Oral QHS Kathlen Mody, MD   1 packet at 06/08/16 2228  . sulfaSALAzine (AZULFIDINE) tablet 500 mg  500 mg Oral BID Kathlen Mody, MD   500 mg at 06/09/16 0957     Discharge Medications: Please see discharge summary for a list of discharge medications.  Relevant Imaging Results:  Relevant Lab Results:   Additional Information SSN:  [id number]    Raye Sorrow, [place]

## 2016-06-09 NOTE — Discharge Summary (Signed)
Physician Discharge Summary  Patient ID: Barbara Lucero MRN: [id number] DOB/AGE: 1940-10-17 76 y.o. Primary Care Physician:Chloee Tena L, MD Admit date: 06/05/2016 Discharge date: 06/09/2016    Discharge Diagnoses:   Active Problems:   Hip fracture (HCC)   Anxiety state   Depression   Crohn's disease (HCC)   Allergies as of 06/09/2016   No Known Allergies     Medication List    STOP taking these medications   ibuprofen 200 MG tablet Commonly known as:  ADVIL,MOTRIN     TAKE these medications   acetaminophen 650 MG CR tablet Commonly known as:  TYLENOL Take 650 mg by mouth every 8 (eight) hours as needed for pain.   aspirin 325 MG EC tablet Take 1 tablet (325 mg total) by mouth daily with breakfast.   b complex vitamins tablet Take 1 tablet by mouth daily.   doxepin 50 MG capsule Commonly known as:  SINEQUAN Take 50 mg by mouth at bedtime.   gabapentin 300 MG capsule Commonly known as:  NEURONTIN Take 300 mg by mouth 3 (three) times daily as needed (pain).   HYDROcodone-acetaminophen 5-325 MG tablet Commonly known as:  NORCO/VICODIN Take 1-2 tablets by mouth every 6 (six) hours as needed for moderate pain.   multivitamin tablet Take 1 tablet by mouth daily.   ondansetron 4 MG tablet Commonly known as:  ZOFRAN Take 1 tablet (4 mg total) by mouth every 6 (six) hours as needed for nausea.   psyllium 58.6 % packet Commonly known as:  METAMUCIL Take 1 packet by mouth at bedtime.   sulfaSALAzine 500 MG tablet Commonly known as:  AZULFIDINE Take 500 mg by mouth 2 (two) times daily.   vitamin C 1000 MG tablet Take 1,000 mg by mouth daily.   vitamin E 400 UNIT capsule Take 400 Units by mouth daily.       Discharged Condition:Improved    Consults: Dr. Romeo Apple  Significant Diagnostic Studies: Dg Chest 1 View  Result Date: 06/05/2016 CLINICAL DATA:  76 year old female status post trip and fall in door way last night. Pain including left hip, and  unable to use left leg this morning. EXAM: CHEST 1 VIEW COMPARISON:  Left shoulder series today reported separately. FINDINGS: AP supine view of the chest at 0905 hours. Low normal lung volumes. Cardiac size at the upper limits of normal. Other mediastinal contours are within normal limits. Calcified aortic atherosclerosis. Visualized tracheal air column is within normal limits. Allowing for portable technique the lungs are clear. No acute osseous abnormality identified. IMPRESSION: 1. No acute cardiopulmonary abnormality. No acute traumatic injury identified to the chest. 2.  Calcified aortic atherosclerosis. Electronically Signed   By: Odessa Fleming M.D.   On: 06/05/2016 09:58   Dg Pelvis 1-2 Views  Result Date: 06/06/2016 CLINICAL DATA:  Postop. EXAM: PELVIS - 1-2 VIEW COMPARISON:  06/05/2016. FINDINGS: Satisfactory appearance status post LEFT hip bipolar hemiarthroplasty. This was performed for a LEFT femoral neck fracture. No adverse features. IMPRESSION: As above. Electronically Signed   By: Elsie Stain M.D.   On: 06/06/2016 16:20   Dg Knee 2 Views Left  Result Date: 06/05/2016 CLINICAL DATA:  76 year old female status post trip and fall in door way last night. Pain including left hip, and unable to use left leg this morning. EXAM: LEFT KNEE - 1-2 VIEW COMPARISON:  Left knee MRI 08/09/2010 FINDINGS: Cross-table AP and lateral views of the left knee. The lateral view is oblique. Osteopenia. Tricompartmental joint space loss and degenerative  spurring. No definite joint effusion. The patella appears intact. No acute osseous abnormality identified. IMPRESSION: Tricompartmental degenerative changes. No acute fracture or dislocation identified about the left knee. Electronically Signed   By: Odessa Fleming M.D.   On: 06/05/2016 09:53   Dg Ankle Complete Left  Result Date: 06/05/2016 CLINICAL DATA:  76 year old female status post trip and fall in door way last night. Pain including left hip, and unable to use left  leg this morning. EXAM: LEFT ANKLE COMPLETE - 3+ VIEW COMPARISON:  None. FINDINGS: 3 cross table views of the left ankle. Osteopenia. Mortise joint alignment preserved. Talar dome intact. No ankle joint effusion identified. Calcaneus appears intact. No fracture of the distal tibia or fibula identified. Visible left foot osseous structures also appear intact. Vascular calcifications about the left tib-fib, likely in part phleboliths. IMPRESSION: Osteopenia. No acute fracture or dislocation identified about the left ankle. Electronically Signed   By: Odessa Fleming M.D.   On: 06/05/2016 09:56   Ct Head Wo Contrast  Result Date: 06/05/2016 CLINICAL DATA:  Fall last night with head injury. Initial encounter. EXAM: CT HEAD WITHOUT CONTRAST TECHNIQUE: Contiguous axial images were obtained from the base of the skull through the vertex without intravenous contrast. COMPARISON:  None. FINDINGS: Brain: No evidence of acute infarction, hemorrhage, hydrocephalus, extra-axial collection or mass lesion/mass effect. 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 Vascular: Atherosclerotic calcification.  The vessel hyperdensity. Skull: Possible mild scalp injury near the vertex. Negative for fracture. Sinuses/Orbits: Bilateral cataract resection. No posttraumatic finding. IMPRESSION: No evidence of intracranial injury or fracture. Electronically Signed   By: Marnee Spring M.D.   On: 06/05/2016 10:10   Ct Chest W Contrast  Result Date: 06/05/2016 CLINICAL DATA:  Short of breath.  Hip fracture EXAM: CT CHEST WITH CONTRAST TECHNIQUE: Multidetector CT imaging of the chest was performed during intravenous contrast administration. CONTRAST:  75mL ISOVUE-300 IOPAMIDOL (ISOVUE-300) INJECTION 61% COMPARISON:  Chest x-ray 06/05/2016 FINDINGS: Cardiovascular: Routine chest CT with contrast was ordered. CT technique not performed. There is limited ability to diagnose pulmonary emboli on this study. No large central emboli identified.  Atherosclerotic aortic arch without aneurysm or dissection. Extensive coronary calcification. Heart size upper normal. Mediastinum/Nodes: Negative Lungs/Pleura: Mild dependent atelectasis in the lung bases. Negative for pneumonia. Negative for mass or effusion. Upper Abdomen: Negative Musculoskeletal: Negative IMPRESSION: Extensive coronary calcification. Mild bibasilar atelectasis. No acute abnormalities chest CTA technique was not ordered for this study and there is limited ability to diagnose pulmonary emboli due to timing of contrast. Electronically Signed   By: Marlan Palau M.D.   On: 06/05/2016 13:36   Dg Shoulder Left  Result Date: 06/05/2016 CLINICAL DATA:  76 year old female status post trip and fall in door way last night. Pain including left hip, and unable to use left leg this morning. EXAM: LEFT SHOULDER - 2+ VIEW COMPARISON:  None. FINDINGS: Two views of the left shoulder. No glenohumeral joint dislocation. Bone mineralization is within normal limits for age. Proximal left humerus appears intact. Visible left clavicle and scapula appear intact. Visible left ribs and lung parenchyma are within normal limits. IMPRESSION: No acute fracture or dislocation identified about the left shoulder. Electronically Signed   By: Odessa Fleming M.D.   On: 06/05/2016 09:57   Dg Hip Unilat W Or Wo Pelvis 2-3 Views Left  Result Date: 06/05/2016 CLINICAL DATA:  76 year old female status post trip and fall in door way last night. Pain including left hip, and unable to use left  leg this morning. EXAM: DG HIP (WITH OR WITHOUT PELVIS) 2-3V LEFT COMPARISON:  None. FINDINGS: Left femoral neck fracture with varus impaction. The left femoral head remains normally located. The intertrochanteric segment of the proximal left femur appears to remain intact. Right femoral head normally located. Grossly intact proximal right femur. No superimposed pelvic fracture identified. Sacral ala and SI joints appear within normal limits.  Probable pessary projects over the central pelvis. IMPRESSION: 1. Left femoral neck fracture with varus impaction. 2. No other acute fracture or dislocation identified about the pelvis. Electronically Signed   By: Odessa Fleming M.D.   On: 06/05/2016 09:52    Lab Results: Basic Metabolic Panel:  Recent Labs  78/29/56 0616 06/08/16 0633  NA 136 134*  K 3.9 3.8  CL 97* 94*  CO2 35* 35*  GLUCOSE 147* 145*  BUN 10 8  CREATININE 0.62 0.58  CALCIUM 8.1* 8.0*   Liver Function Tests: No results for input(s): AST, ALT, ALKPHOS, BILITOT, PROT, ALBUMIN in the last 72 hours.   CBC:  Recent Labs  06/08/16 0633 06/09/16 0604  WBC 13.0* 11.1*  HGB 11.3* 11.0*  HCT 35.5* 34.9*  MCV 93.7 92.8  PLT 142* 153    Recent Results (from the past 240 hour(s))  Urine culture     Status: None   Collection Time: 06/05/16 10:07 AM  Result Value Ref Range Status   Specimen Description URINE, CATHETERIZED  Final   Special Requests Normal  Final   Culture   Final    NO GROWTH Performed at [facility], [address] [place], [place] [address]    Report Status 06/07/2016 FINAL  Final  Surgical pcr screen     Status: None   Collection Time: 06/05/16 10:45 PM  Result Value Ref Range Status   MRSA, PCR NEGATIVE NEGATIVE Final   Staphylococcus aureus NEGATIVE NEGATIVE Final    Comment:        The Xpert SA Assay (FDA approved for NASAL specimens in patients over 49 years of age), is one component of a comprehensive surveillance program.  Test performance has been validated by [facility] for patients greater than or equal to 91 year old. It is not intended to diagnose infection nor to guide or monitor treatment.      Hospital Course: This is a 76 year old who fell at home and suffered a fracture of her left hip. She underwent surgery the next day and started with physical therapy. Physical therapy recommended that she have skilled care facility placement and that is being accomplished.  She has a history of anxiety and depression at baseline and that's pretty stable and she has Crohn's disease which has not given her any trouble hearing the hospital. She is doing okay on oral pain medication.  Discharge Exam: Blood pressure (!) 127/55, pulse 92, temperature 99.1 F (37.3 C), temperature source Oral, resp. rate 18, height 5\' 6"  (1.676 m), weight 102.5 kg (226 lb), SpO2 94 %. She is awake and alert. Chest is clear. Heart is regular.  Disposition: To skilled care facility      Signed: Avamarie Crossley L   06/09/2016, 8:43 AM

## 2016-06-09 NOTE — Progress Notes (Signed)
Patient discharging to [facility].  IV removed - WNL.  Report called and given to [place] at facility.  Patient in NAD.

## 2016-06-09 NOTE — Clinical Social Work Placement (Signed)
   CLINICAL SOCIAL WORK PLACEMENT  NOTE  Date:  06/09/2016  Patient Details  Name: Barbara Lucero MRN: [id number] Date of Birth: 1940-05-30  Clinical Social Work is seeking post-discharge placement for this patient at the Skilled  Nursing Facility level of care (*CSW will initial, date and re-position this form in  chart as items are completed):  Yes   Patient/family provided with [place] Clinical Social Work Department's list of facilities offering this level of care within the geographic area requested by the patient (or if unable, by the patient's family).  Yes   Patient/family informed of their freedom to choose among providers that offer the needed level of care, that participate in Medicare, Medicaid or managed care program needed by the patient, have an available bed and are willing to accept the patient.  Yes   Patient/family informed of [facility]'s ownership interest in [facility] and [facility], as well as of the fact that they are under no obligation to receive care at these facilities.  PASRR submitted to EDS on 06/09/16     PASRR number received on 06/09/16     Existing PASRR number confirmed on       FL2 transmitted to all facilities in geographic area requested by pt/family on 06/09/16     FL2 transmitted to all facilities within larger geographic area on       Patient informed that his/her managed care company has contracts with or will negotiate with certain facilities, including the following:            Patient/family informed of bed offers received.  Patient chooses bed at       Physician recommends and patient chooses bed at      Patient to be transferred to   on  .  Patient to be transferred to facility by       Patient family notified on   of transfer.  Name of family member notified:        PHYSICIAN Please sign FL2     Additional Comment:    _______________________________________________ Raye Sorrow, LCSW 06/09/2016, 11:15  AM

## 2016-06-09 NOTE — Care Management Important Message (Signed)
Important Message  Patient Details  Name: Barbara Lucero MRN: [id number] Date of Birth: 11/19/1940   Medicare Important Message Given:  Yes    Edom Schmuhl, Chrystine Oiler, RN 06/09/2016, 9:28 AM

## 2016-06-09 NOTE — Clinical Social Work Note (Signed)
Clinical Social Work Assessment  Patient Details  Name: Barbara Lucero MRN: [id number] Date of Birth: 05/17/1940  Date of referral:  06/09/16               Reason for consult:  Facility Placement, Discharge Planning                Permission sought to share information with:  Case Manager, Magazine features editor, Family Supports Permission granted to share information::  Yes, Verbal Permission Granted  Name::        Agency::  SNF HUB  Relationship::  husband  Contact Information:     Housing/Transportation Living arrangements for the past 2 months:  Single Family Home Source of Information:  Patient, Medical Team, Case Manager, Spouse Patient Interpreter Needed:  None Criminal Activity/Legal Involvement Pertinent to Current Situation/Hospitalization:    Significant Relationships:  Other Family Members, Spouse Lives with:  Spouse Do you feel safe going back to the place where you live?  No Need for family participation in patient care:  No (Coment)  Care giving concerns:  Patient admitted from home with her husband.  At this time husband unable to care for patient at current baseline and recommendation is SNF.  Patient is agreeable to SNF work up, interested in [facility] for bed.    Social Worker assessment / plan:  Assessment completed and consult explained to patient. Patient educated regarding role and reason for consult. Patient agreeable to SNF. Work up completed. Bed offers given: chose [facility] for bed  Plan is for DC today and patient is agreeable.   Employment status:  Retired Database administrator PT Recommendations:  Skilled Nursing Facility Information / Referral to community resources:  Skilled Nursing Facility  Patient/Family's Response to care:  Understanding  Patient/Family's Understanding of and Emotional Response to Diagnosis, Current Treatment, and Prognosis:  Patient aware of plan and reason for treatment. Need for more  assistance at home prior to DC.  Will DC today.  Emotional Assessment Appearance:  Appears stated age Attitude/Demeanor/Rapport:    Affect (typically observed):  Accepting, Adaptable, Pleasant Orientation:  Oriented to Self, Oriented to Place, Oriented to  Time, Oriented to Situation Alcohol / Substance use:  Not Applicable Psych involvement (Current and /or in the community):  No (Comment)  Discharge Needs  Concerns to be addressed:  No discharge needs identified Readmission within the last 30 days:  No Current discharge risk:  None Barriers to Discharge:  No Barriers Identified   Raye Sorrow, LCSW 06/09/2016, 11:50 AM

## 2016-06-09 NOTE — Progress Notes (Signed)
Patient has been accepted at [facility] for discharge today. Husband aware and patient agreeable to plan. Patient will transfer by hospital staff to facility. All information sent via HUB. No other needs.  DC today.  Deretha Emory, MSW Clinical Social Work: Optician, dispensing Coverage for :  [phone]

## 2016-06-09 NOTE — Clinical Social Work Placement (Signed)
   CLINICAL SOCIAL WORK PLACEMENT  NOTE  Date:  06/09/2016  Patient Details  Name: Barbara Lucero MRN: [id number] Date of Birth: 1940-03-07  Clinical Social Work is seeking post-discharge placement for this patient at the Skilled  Nursing Facility level of care (*CSW will initial, date and re-position this form in  chart as items are completed):  Yes   Patient/family provided with [facility] Clinical Social Work Department's list of facilities offering this level of care within the geographic area requested by the patient (or if unable, by the patient's family).  Yes   Patient/family informed of their freedom to choose among providers that offer the needed level of care, that participate in Medicare, Medicaid or managed care program needed by the patient, have an available bed and are willing to accept the patient.  Yes   Patient/family informed of [facility]'s ownership interest in [facility] and [facility], as well as of the fact that they are under no obligation to receive care at these facilities.  PASRR submitted to EDS on 06/09/16     PASRR number received on 06/09/16     Existing PASRR number confirmed on       FL2 transmitted to all facilities in geographic area requested by pt/family on 06/09/16     FL2 transmitted to all facilities within larger geographic area on       Patient informed that his/her managed care company has contracts with or will negotiate with certain facilities, including the following:        Yes   Patient/family informed of bed offers received.  Patient chooses bed at [facility]     Physician recommends and patient chooses bed at [facility]    Patient to be transferred to [facility] on 06/09/16.  Patient to be transferred to facility by [facility] Staff     Patient family notified on 06/09/16 of transfer.  Name of family member notified:        PHYSICIAN Please sign FL2     Additional Comment:     _______________________________________________ Raye Sorrow, LCSW 06/09/2016, 12:00 PM

## 2016-06-10 ENCOUNTER — Emergency Department (HOSPITAL_COMMUNITY)
Admission: EM | Admit: 2016-06-10 | Discharge: 2016-06-10 | Disposition: A | Discharge status: Home / Self Care | Payer: Medicare Other | Attending: Emergency Medicine | Admitting: Emergency Medicine

## 2016-06-10 ENCOUNTER — Encounter (HOSPITAL_COMMUNITY): Payer: Self-pay | Admitting: *Deleted

## 2016-06-10 ENCOUNTER — Inpatient Hospital Stay
Admission: RE | Admit: 2016-06-10 | Discharge: 2016-06-25 | Disposition: A | Discharge status: Home / Self Care | Payer: Medicare Other | Source: Ambulatory Visit | Attending: Pulmonary Disease | Admitting: Pulmonary Disease

## 2016-06-10 ENCOUNTER — Emergency Department (HOSPITAL_COMMUNITY): Payer: Medicare Other

## 2016-06-10 DIAGNOSIS — J9611 Chronic respiratory failure with hypoxia: Secondary | ICD-10-CM | POA: Diagnosis not present

## 2016-06-10 DIAGNOSIS — R0902 Hypoxemia: Secondary | ICD-10-CM | POA: Diagnosis not present

## 2016-06-10 DIAGNOSIS — Z7982 Long term (current) use of aspirin: Secondary | ICD-10-CM | POA: Insufficient documentation

## 2016-06-10 NOTE — ED Notes (Signed)
Report given to [facility] at [facility]

## 2016-06-10 NOTE — ED Notes (Signed)
Pt sent from [facility] w/ low O2 sats. Pt denies being SOB. Pt satting 93% on 2L. Pt says she has never been on oxygen before coming to the hospital.

## 2016-06-10 NOTE — ED Notes (Signed)
Pt sleeping at this time. VS stable.

## 2016-06-10 NOTE — ED Provider Notes (Addendum)
[facility]-EMER[facility]Y DEPT Provider Note   CSN: [id number] Arrival date & time: 06/10/16  0133     History   Chief Complaint Chief Complaint  Patient presents with  . Shortness of Breath    HPI Barbara Lucero is a 76 y.o. female.  HPI Pt with hx of crohns and depression comes in from [facility] with cc of hypoxia. Pt had a fall recently and required hip replacement. She is at a SNF right now, and was sent here for hypoxia. I spoke with the RN over there, who was very insightful. She reported to me that pt arrived to the SNF just recently with O2. Overnight, pt was noted by her staff to be hypoxic, O2 sats were in the 70s and pt was sleepy. She woke patient up and closed her mouth - and the O2 sats went up to the low 90s. Pt was very drowsy however - and so they called EMS. Pt had wet herself, so they changed her, and due to pain - she became more alert and O2 sats got even better. Pt arrived to the ER awake and alert. She reported no DIB. SNF or EMS never noted any resp distress either. Pt denies cough.  Upon review of patient's chart - it seems like she was always on 2 L [place] here. Also, pt was noted to be hypoxic by the Hospitalist, and she had a CT chest -which showed no large PE (pt had a CT chest and not CT angio).    Past Medical History:  Diagnosis Date  . Anxiety   . Arthritis   . Crohn disease (HCC)   . Depression     Patient Active Problem List   Diagnosis Date Noted  . Anxiety state 06/08/2016  . Depression 06/08/2016  . Crohn's disease (HCC) 06/08/2016  . Hip fracture (HCC) 06/05/2016  . 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 Foot fracture 12/15/2013  . Metatarsal fracture 11/05/2013    Past Surgical History:  Procedure Laterality Date  . BOWEL RESECTION      OB History    Gravida Para Term Preterm AB Living             2   SAB TAB Ectopic Multiple Live Births                   Home Medications    Prior to Admission medications   Medication Sig Start  Date End Date Taking? Authorizing Provider  acetaminophen (TYLENOL) 650 MG CR tablet Take 650 mg by mouth every 8 (eight) hours as needed for pain.    Historical Provider, MD  Ascorbic Acid (VITAMIN C) 1000 MG tablet Take 1,000 mg by mouth daily.    Historical Provider, MD  aspirin EC 325 MG EC tablet Take 1 tablet (325 mg total) by mouth daily with breakfast. 06/09/16   Kari Baars, MD  b complex vitamins tablet Take 1 tablet by mouth daily.    Historical Provider, MD  doxepin (SINEQUAN) 50 MG capsule Take 50 mg by mouth at bedtime.     Historical Provider, MD  gabapentin (NEURONTIN) 300 MG capsule Take 300 mg by mouth 3 (three) times daily as needed (pain).     Historical Provider, MD  HYDROcodone-acetaminophen (NORCO/VICODIN) 5-325 MG tablet Take 1-2 tablets by mouth every 6 (six) hours as needed for moderate pain. 06/09/16   Kari Baars, MD  Multiple Vitamin (MULTIVITAMIN) tablet Take 1 tablet by mouth daily.    Historical Provider, MD  ondansetron (  ZOFRAN) 4 MG tablet Take 1 tablet (4 mg total) by mouth every 6 (six) hours as needed for nausea. 06/09/16   Kari Baars, MD  psyllium (METAMUCIL) 58.6 % packet Take 1 packet by mouth at bedtime.    Historical Provider, MD  sulfaSALAzine (AZULFIDINE) 500 MG tablet Take 500 mg by mouth 2 (two) times daily.     Historical Provider, MD  vitamin E 400 UNIT capsule Take 400 Units by mouth daily.    Historical Provider, MD    Family History Family History  Problem Relation Age of Onset  . Breast cancer Sister   . Breast cancer Sister     Social History Social History  Substance Use Topics  . Smoking status: Never Smoker  . Smokeless tobacco: Never Used  . Alcohol use No     Allergies   Patient has no known allergies.   Review of Systems Review of Systems  All other systems reviewed and are negative.    Physical Exam Updated Vital Signs BP 133/67   Pulse 77   Temp 98 F (36.7 C) (Oral)   Resp 20   Ht 5\' 6"  (1.676 m)    Wt 218 lb (98.9 kg)   SpO2 96%   BMI 35.19 kg/m   Physical Exam  Constitutional: She is oriented to person, place, and time. She appears well-developed and well-nourished.  HENT:  Head: Normocephalic and atraumatic.  Eyes: EOM are normal. Pupils are equal, round, and reactive to light.  Neck: Neck supple.  Cardiovascular: Normal rate, regular rhythm and normal heart sounds.   No murmur heard. Pulmonary/Chest: Effort normal. No respiratory distress.  Abdominal: Soft. She exhibits no distension. There is no tenderness. There is no rebound and no guarding.  Neurological: She is alert and oriented to person, place, and time.  Skin: Skin is warm and dry.  Nursing note and vitals reviewed.    ED Treatments / Results  Labs (all labs ordered are listed, but only abnormal results are displayed) Labs Reviewed - No data to display  EKG  EKG Interpretation None       Radiology Dg Chest Portable 1 View  Result Date: 06/10/2016 CLINICAL DATA:  Low oxygen saturation. EXAM: PORTABLE CHEST 1 VIEW COMPARISON:  06/05/2016 FINDINGS: Shallow inspiration. Borderline heart size and pulmonary vascularity. These are likely normal for technique. No focal airspace disease or consolidation. No blunting of costophrenic angles. No pneumothorax. No significant change since prior study. IMPRESSION: No active disease. Electronically Signed   By: Burman Nieves M.D.   On: 06/10/2016 02:11    Procedures Procedures (including critical care time)  Medications Ordered in ED Medications - No data to display   Initial Impression / Assessment and Plan / ED Course  I have reviewed the triage vital signs and the nursing notes.  Pertinent labs & imaging results that were available during my care of the patient were reviewed by me and considered in my medical decision making (see chart for details).  Clinical Course as of Jun 10 640  Tue Jun 10, 2016  0636 O2 sats 97% now while patient is sleeping, will  d/c. 4+ hours of ER monitoring done. There is hypoxia when O 2discontinued, and so we will request further workup by the nursing home team.  [AN]    Clinical Course User Index [AN] Derwood Kaplan, MD    Pt comes in with cc of low O2 sats. It seems to me that pt likely has OSA, or low O2 reserve due  to chronic deconditioning. Given that pt was hypoxic when she first arrived to the ER last time, she had a  Subsequent CT scan of the chest with contrast that didn't show a large PE, and she remained 90-94% in the hospital with 2 L - exact sats we see now, and there is no chest pain / dib / tachycardia / tachypnea - I don't think CT PE is indicated.  Resp depression could be due to pain meds as well.  Plan is to monitor pt here for a bit longer. We will recommend sleep study and for the SNF to reduce pain meds at night. SNF team told me that APP and Physician will round on patient's today at the SNF - so that's reassuring. We will also recommend Pulm consultation. Final Clinical Impressions(s) / ED Diagnoses   Final diagnoses:  Chronic respiratory failure with hypoxia [facility])    New Prescriptions New Prescriptions   No medications on file     Derwood Kaplan, MD 06/10/16 1914    Derwood Kaplan, MD 06/10/16 [phone]

## 2016-06-10 NOTE — Discharge Instructions (Signed)
Barbara Lucero was sent to the ER for low oxygen saturations. We have not had desaturations with 2 L O2 since patient got here. We believe the overnight team the SNF, as they did a thorough evaluation before sending the patient here. The causes for her hypoxia could be many - but we dont think pulmonary embolism is high on that list.  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 There was no workup done in the hospital to figure out the hypoxia. Also, we recommend a sleep study -as OSA is possible. Finally, if Barbara Lucero is getting pain or sleep meds at night, please cut back those meds unless absolutely needed.

## 2016-06-10 NOTE — ED Triage Notes (Signed)
Pt brought in by [facility] for c/o hypoxia and sob; staff at [facility] reports pt had an O2 sat in the mid 80's and when she was placed on 3L [place] of O@ her sats came up to 94%; pt reports she feels fine and her breathing is fine

## 2016-06-10 NOTE — ED Notes (Signed)
Pt resting w/ eyes closed, ride & fall of chest noted. O2 sats remain 95% on 2L.

## 2016-06-12 ENCOUNTER — Ambulatory Visit: Payer: Self-pay | Admitting: Obstetrics & Gynecology

## 2016-06-12 LAB — AEROBIC CULTURE  (SUPERFICIAL SPECIMEN)

## 2016-06-12 LAB — AEROBIC CULTURE W GRAM STAIN (SUPERFICIAL SPECIMEN)

## 2016-06-15 DIAGNOSIS — K5090 Crohn's disease, unspecified, without complications: Secondary | ICD-10-CM | POA: Diagnosis not present

## 2016-06-15 DIAGNOSIS — S72091A Other fracture of head and neck of right femur, initial encounter for closed fracture: Secondary | ICD-10-CM | POA: Diagnosis not present

## 2016-06-15 NOTE — H&P (Signed)
Barbara Lucero MRN: [id number] DOB/AGE: [birth year] 76 y.o. Primary Care Physician:Chealsey Miyamoto L, MD Admit date: 06/10/2016 Chief Complaint: In for rehabilitation after hip fracture HPI: This is a 76 year old who had a left hip fracture and had surgery for that and has gone to the skilled care facility for rehabilitation. She was found to be hypoxic after she was transferred and is felt that she may have sleep apnea. She is wearing oxygen at night. She doesn't need it during the day. She says her breathing is okay. She has no other new complaints except for gas. 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 She says other than the gas her bowels are doing okay. She's not short of breath. No nausea vomiting diarrhea. No chest pain. No hemoptysis.  Past Medical History:  Diagnosis Date  . Anxiety   . Arthritis   . Crohn disease (HCC)   . Depression    Past Surgical History:  Procedure Laterality Date  . BOWEL RESECTION    . HIP ARTHROPLASTY Left 06/06/2016   Procedure: ARTHROPLASTY BIPOLAR HIP (HEMIARTHROPLASTY);  Surgeon: Vickki Hearing, MD;  Location: [facility];  Service: Orthopedics;  Laterality: Left;        Family History  Problem Relation Age of Onset  . Breast cancer Sister   . Breast cancer Sister     Social History:  reports that she has never smoked. She has never used smokeless tobacco. She reports that she does not drink alcohol or use drugs.   Allergies: No Known Allergies  Medications Prior to Admission  Medication Sig Dispense Refill  . acetaminophen (TYLENOL) 650 MG CR tablet Take 650 mg by mouth every 8 (eight) hours as needed for pain.    . Ascorbic Acid (VITAMIN C) 1000 MG tablet Take 1,000 mg by mouth daily.    Marland Kitchen aspirin EC 325 MG EC tablet Take 1 tablet (325 mg total) by mouth daily with breakfast. 30 tablet 0  . b complex vitamins tablet Take 1 tablet by mouth daily.    Marland Kitchen doxepin (SINEQUAN) 50 MG  capsule Take 50 mg by mouth at bedtime.     . gabapentin (NEURONTIN) 300 MG capsule Take 300 mg by mouth 3 (three) times daily as needed (pain).     Marland Kitchen HYDROcodone-acetaminophen (NORCO/VICODIN) 5-325 MG tablet Take 1-2 tablets by mouth every 6 (six) hours as needed for moderate pain. 30 tablet 0  . Multiple Vitamin (MULTIVITAMIN) tablet Take 1 tablet by mouth daily.    . ondansetron (ZOFRAN) 4 MG tablet Take 1 tablet (4 mg total) by mouth every 6 (six) hours as needed for nausea. 20 tablet 0  . psyllium (METAMUCIL) 58.6 % packet Take 1 packet by mouth at bedtime.    . sulfaSALAzine (AZULFIDINE) 500 MG tablet Take 500 mg by mouth 2 (two) times daily.     . vitamin E 400 UNIT capsule Take 400 Units by mouth daily.         WUJ:WJXBJ from the symptoms mentioned above,there are no other symptoms referable to all systems reviewed.10 point review of systems negative  Physical Exam: There were no vitals taken for this visit. Constitutional: She is awake and alert and in no acute distress. She is sitting in a wheelchair. Eyes: Pupils react. Ears nose mouth and throat: Mucous membranes are moist. Hearing is grossly normal. Cardiovascular: Her heart is regular with normal heart sounds. No edema. Respiratory: Her respiratory effort is normal. Her lungs are  clear. Gastrointestinal: Her abdomen is soft with no masses. Skin: Warm and dry. Musculoskeletal: She is able to lift her left leg. Strength is normal in the upper extremities. Neurological: No focal abnormalities. Psychiatric: Normal mood and affect   No results for input(s): WBC, NEUTROABS, HGB, HCT, MCV, PLT in the last 72 hours. No results for input(s): NA, K, CL, CO2, GLUCOSE, BUN, CREATININE, CALCIUM, MG in the last 72 hours.  Invalid input(s): PHOlablast2(ast:2,ALT:2,alkphos:2,bilitot:2,prot:2,albumin:2)@    Recent Results (from the past 240 hour(s))  Surgical pcr screen     Status: None   Collection Time: 06/05/16 10:45 PM  Result Value  Ref Range Status   MRSA, PCR NEGATIVE NEGATIVE Final   Staphylococcus aureus NEGATIVE NEGATIVE Final    Comment:        The Xpert SA Assay (FDA approved for NASAL specimens in patients over 17 years of age), is one component of a comprehensive surveillance program.  Test performance has been validated by [facility] for patients greater than or equal to 62 year old. It is not intended to diagnose infection nor to guide or monitor treatment.   Aerobic Culture (superficial specimen)     Status: Abnormal   Collection Time: 06/09/16  1:40 PM  Result Value Ref Range Status   Specimen Description VAGINA  Final   Special Requests NONE  Final   Gram Stain   Final    FEW SQUAMOUS EPITHELIAL CELLS PRESENT FEW WBC PRESENT, PREDOMINANTLY MONONUCLEAR ABUNDANT GRAM POSITIVE COCCI IN PAIRS FEW GRAM NEGATIVE RODS FEW GRAM POSITIVE RODS RARE YEAST Performed at [facility], [address], [place], [place] [address]    Culture MULTIPLE ORGANISMS PRESENT, NONE PREDOMINANT (A)  Final   Report Status 06/12/2016 FINAL  Final     Dg Chest 1 View  Result Date: 06/05/2016 CLINICAL DATA:  76 year old female status post trip and fall in door way last night. Pain including left hip, and unable to use left leg this morning. EXAM: CHEST 1 VIEW COMPARISON:  Left shoulder series today reported separately. FINDINGS: AP supine view of the chest at 0905 hours. Low normal lung volumes. Cardiac size at the upper limits of normal. Other mediastinal contours are within normal limits. Calcified aortic atherosclerosis. Visualized tracheal air column is within normal limits. Allowing for portable technique the lungs are clear. No acute osseous abnormality identified. IMPRESSION: 1. No acute cardiopulmonary abnormality. No acute traumatic injury identified to the chest. 2.  Calcified aortic atherosclerosis. Electronically Signed   By: Odessa Fleming M.D.   On: 06/05/2016 09:58   Dg Pelvis 1-2 Views  Result Date:  06/06/2016 CLINICAL DATA:  Postop. EXAM: PELVIS - 1-2 VIEW COMPARISON:  06/05/2016. FINDINGS: Satisfactory appearance status post LEFT hip bipolar hemiarthroplasty. This was performed for a LEFT femoral neck fracture. No adverse features. IMPRESSION: As above. Electronically Signed   By: Elsie Stain M.D.   On: 06/06/2016 16:20   Dg Knee 2 Views Left  Result Date: 06/05/2016 CLINICAL DATA:  76 year old female status post trip and fall in door way last night. Pain including left hip, and unable to use left leg this morning. EXAM: LEFT KNEE - 1-2 VIEW COMPARISON:  Left knee MRI 08/09/2010 FINDINGS: Cross-table AP and lateral views of the left knee. The lateral view is oblique. Osteopenia. Tricompartmental joint space loss and degenerative spurring. No definite joint effusion. The patella appears intact. No acute osseous abnormality identified. IMPRESSION: Tricompartmental degenerative changes. No acute fracture or dislocation identified about the left knee. Electronically  Signed   By: Odessa Fleming M.D.   On: 06/05/2016 09:53   Dg Ankle Complete Left  Result Date: 06/05/2016 CLINICAL DATA:  76 year old female status post trip and fall in door way last night. Pain including left hip, and unable to use left leg this morning. EXAM: LEFT ANKLE COMPLETE - 3+ VIEW COMPARISON:  None. FINDINGS: 3 cross table views of the left ankle. Osteopenia. Mortise joint alignment preserved. Talar dome intact. No ankle joint effusion identified. Calcaneus appears intact. No fracture of the distal tibia or fibula identified. Visible left foot osseous structures also appear intact. Vascular calcifications about the left tib-fib, likely in part phleboliths. IMPRESSION: Osteopenia. No acute fracture or dislocation identified about the left ankle. Electronically Signed   By: Odessa Fleming M.D.   On: 06/05/2016 09:56   Ct Head Wo Contrast  Result Date: 06/05/2016 CLINICAL DATA:  Fall last night with head injury. Initial encounter. EXAM: CT  HEAD WITHOUT CONTRAST TECHNIQUE: Contiguous axial images were obtained from the base of the skull through the vertex without intravenous contrast. COMPARISON:  None. FINDINGS: Brain: No evidence of acute infarction, hemorrhage, hydrocephalus, extra-axial collection or mass lesion/mass effect. 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 Vascular: Atherosclerotic calcification.  The vessel hyperdensity. Skull: Possible mild scalp injury near the vertex. Negative for fracture. Sinuses/Orbits: Bilateral cataract resection. No posttraumatic finding. IMPRESSION: No evidence of intracranial injury or fracture. Electronically Signed   By: Marnee Spring M.D.   On: 06/05/2016 10:10   Ct Chest W Contrast  Result Date: 06/05/2016 CLINICAL DATA:  Short of breath.  Hip fracture EXAM: CT CHEST WITH CONTRAST TECHNIQUE: Multidetector CT imaging of the chest was performed during intravenous contrast administration. CONTRAST:  75mL ISOVUE-300 IOPAMIDOL (ISOVUE-300) INJECTION 61% COMPARISON:  Chest x-ray 06/05/2016 FINDINGS: Cardiovascular: Routine chest CT with contrast was ordered. CT technique not performed. There is limited ability to diagnose pulmonary emboli on this study. No large central emboli identified. Atherosclerotic aortic arch without aneurysm or dissection. Extensive coronary calcification. Heart size upper normal. Mediastinum/Nodes: Negative Lungs/Pleura: Mild dependent atelectasis in the lung bases. Negative for pneumonia. Negative for mass or effusion. Upper Abdomen: Negative Musculoskeletal: Negative IMPRESSION: Extensive coronary calcification. Mild bibasilar atelectasis. No acute abnormalities chest CTA technique was not ordered for this study and there is limited ability to diagnose pulmonary emboli due to timing of contrast. Electronically Signed   By: Marlan Palau M.D.   On: 06/05/2016 13:36   Dg Chest Portable 1 View  Result Date: 06/10/2016 CLINICAL DATA:  Low oxygen saturation. EXAM:  PORTABLE CHEST 1 VIEW COMPARISON:  06/05/2016 FINDINGS: Shallow inspiration. Borderline heart size and pulmonary vascularity. These are likely normal for technique. No focal airspace disease or consolidation. No blunting of costophrenic angles. No pneumothorax. No significant change since prior study. IMPRESSION: No active disease. Electronically Signed   By: Burman Nieves M.D.   On: 06/10/2016 02:11   Dg Shoulder Left  Result Date: 06/05/2016 CLINICAL DATA:  76 year old female status post trip and fall in door way last night. Pain including left hip, and unable to use left leg this morning. EXAM: LEFT SHOULDER - 2+ VIEW COMPARISON:  None. FINDINGS: Two views of the left shoulder. No glenohumeral joint dislocation. Bone mineralization is within normal limits for age. Proximal left humerus appears intact. Visible left clavicle and scapula appear intact. Visible left ribs and lung parenchyma are within normal limits. IMPRESSION: No acute fracture or dislocation identified about the left shoulder. Electronically Signed   By: Rexene Edison  Margo Aye M.D.   On: 06/05/2016 09:57   Dg Hip Unilat W Or Wo Pelvis 2-3 Views Left  Result Date: 06/05/2016 CLINICAL DATA:  76 year old female status post trip and fall in door way last night. Pain including left hip, and unable to use left leg this morning. EXAM: DG HIP (WITH OR WITHOUT PELVIS) 2-3V LEFT COMPARISON:  None. FINDINGS: Left femoral neck fracture with varus impaction. The left femoral head remains normally located. The intertrochanteric segment of the proximal left femur appears to remain intact. Right femoral head normally located. Grossly intact proximal right femur. No superimposed pelvic fracture identified. Sacral ala and SI joints appear within normal limits. Probable pessary projects over the central pelvis. IMPRESSION: 1. Left femoral neck fracture with varus impaction. 2. No other acute fracture or dislocation identified about the pelvis. Electronically Signed   By:  Odessa Fleming M.D.   On: 06/05/2016 09:52   Impression: She has a hip fracture. She is undergoing rehabilitation for that.  She complains of gas and I will add simethicone  She has a history of Crohn's disease which is stable  She may have sleep apnea and she will need to have outpatient workup Active Problems:   * No active hospital problems. *     Plan: Continue current treatments.      Jordani Nunn L   06/15/2016, 11:01 AM

## 2016-06-20 ENCOUNTER — Ambulatory Visit (INDEPENDENT_AMBULATORY_CARE_PROVIDER_SITE_OTHER): Payer: Self-pay | Admitting: Orthopedic Surgery

## 2016-06-20 ENCOUNTER — Encounter: Payer: Self-pay | Admitting: Orthopedic Surgery

## 2016-06-20 DIAGNOSIS — Z4889 Encounter for other specified surgical aftercare: Secondary | ICD-10-CM

## 2016-06-20 NOTE — Progress Notes (Signed)
Postop appointment  Status post left hip fracture  Left bipolar prosthesis  Patient is doing well suture line looks good. She has no ankle swelling in the office today  Her leg lengths look good her hip motion is good at 115 of flexion  Recommend weight-bear as tolerated, continue therapy  Follow-up 4 weeks  Encounter Diagnosis  Name Primary?  Marland Kitchen Aftercare following surgery Yes

## 2016-06-27 DIAGNOSIS — Z96642 Presence of left artificial hip joint: Secondary | ICD-10-CM | POA: Diagnosis not present

## 2016-06-27 DIAGNOSIS — S72002D Fracture of unspecified part of neck of left femur, subsequent encounter for closed fracture with routine healing: Secondary | ICD-10-CM | POA: Diagnosis not present

## 2016-06-27 DIAGNOSIS — K5090 Crohn's disease, unspecified, without complications: Secondary | ICD-10-CM | POA: Diagnosis not present

## 2016-06-27 DIAGNOSIS — Z7982 Long term (current) use of aspirin: Secondary | ICD-10-CM | POA: Diagnosis not present

## 2016-06-27 DIAGNOSIS — W19XXXD Unspecified fall, subsequent encounter: Secondary | ICD-10-CM | POA: Diagnosis not present

## 2016-06-30 DIAGNOSIS — K5090 Crohn's disease, unspecified, without complications: Secondary | ICD-10-CM | POA: Diagnosis not present

## 2016-06-30 DIAGNOSIS — S72002D Fracture of unspecified part of neck of left femur, subsequent encounter for closed fracture with routine healing: Secondary | ICD-10-CM | POA: Diagnosis not present

## 2016-06-30 DIAGNOSIS — Z96642 Presence of left artificial hip joint: Secondary | ICD-10-CM | POA: Diagnosis not present

## 2016-06-30 DIAGNOSIS — Z7982 Long term (current) use of aspirin: Secondary | ICD-10-CM | POA: Diagnosis not present

## 2016-06-30 DIAGNOSIS — W19XXXD Unspecified fall, subsequent encounter: Secondary | ICD-10-CM | POA: Diagnosis not present

## 2016-07-02 DIAGNOSIS — S72002D Fracture of unspecified part of neck of left femur, subsequent encounter for closed fracture with routine healing: Secondary | ICD-10-CM | POA: Diagnosis not present

## 2016-07-02 DIAGNOSIS — Z7982 Long term (current) use of aspirin: Secondary | ICD-10-CM | POA: Diagnosis not present

## 2016-07-02 DIAGNOSIS — S72009A Fracture of unspecified part of neck of unspecified femur, initial encounter for closed fracture: Secondary | ICD-10-CM | POA: Diagnosis not present

## 2016-07-02 DIAGNOSIS — K5090 Crohn's disease, unspecified, without complications: Secondary | ICD-10-CM | POA: Diagnosis not present

## 2016-07-02 DIAGNOSIS — Z96642 Presence of left artificial hip joint: Secondary | ICD-10-CM | POA: Diagnosis not present

## 2016-07-02 DIAGNOSIS — W19XXXD Unspecified fall, subsequent encounter: Secondary | ICD-10-CM | POA: Diagnosis not present

## 2016-07-02 DIAGNOSIS — J9611 Chronic respiratory failure with hypoxia: Secondary | ICD-10-CM | POA: Diagnosis not present

## 2016-07-04 DIAGNOSIS — Z7982 Long term (current) use of aspirin: Secondary | ICD-10-CM | POA: Diagnosis not present

## 2016-07-04 DIAGNOSIS — Z96642 Presence of left artificial hip joint: Secondary | ICD-10-CM | POA: Diagnosis not present

## 2016-07-04 DIAGNOSIS — W19XXXD Unspecified fall, subsequent encounter: Secondary | ICD-10-CM | POA: Diagnosis not present

## 2016-07-04 DIAGNOSIS — S72002D Fracture of unspecified part of neck of left femur, subsequent encounter for closed fracture with routine healing: Secondary | ICD-10-CM | POA: Diagnosis not present

## 2016-07-04 DIAGNOSIS — K5090 Crohn's disease, unspecified, without complications: Secondary | ICD-10-CM | POA: Diagnosis not present

## 2016-07-07 DIAGNOSIS — S72002D Fracture of unspecified part of neck of left femur, subsequent encounter for closed fracture with routine healing: Secondary | ICD-10-CM | POA: Diagnosis not present

## 2016-07-07 DIAGNOSIS — K5090 Crohn's disease, unspecified, without complications: Secondary | ICD-10-CM | POA: Diagnosis not present

## 2016-07-07 DIAGNOSIS — Z7982 Long term (current) use of aspirin: Secondary | ICD-10-CM | POA: Diagnosis not present

## 2016-07-07 DIAGNOSIS — Z96642 Presence of left artificial hip joint: Secondary | ICD-10-CM | POA: Diagnosis not present

## 2016-07-07 DIAGNOSIS — W19XXXD Unspecified fall, subsequent encounter: Secondary | ICD-10-CM | POA: Diagnosis not present

## 2016-07-09 DIAGNOSIS — R600 Localized edema: Secondary | ICD-10-CM | POA: Diagnosis not present

## 2016-07-09 DIAGNOSIS — S72002D Fracture of unspecified part of neck of left femur, subsequent encounter for closed fracture with routine healing: Secondary | ICD-10-CM | POA: Diagnosis not present

## 2016-07-09 DIAGNOSIS — K5090 Crohn's disease, unspecified, without complications: Secondary | ICD-10-CM | POA: Diagnosis not present

## 2016-07-10 DIAGNOSIS — K5090 Crohn's disease, unspecified, without complications: Secondary | ICD-10-CM | POA: Diagnosis not present

## 2016-07-10 DIAGNOSIS — Z96642 Presence of left artificial hip joint: Secondary | ICD-10-CM | POA: Diagnosis not present

## 2016-07-10 DIAGNOSIS — S72002D Fracture of unspecified part of neck of left femur, subsequent encounter for closed fracture with routine healing: Secondary | ICD-10-CM | POA: Diagnosis not present

## 2016-07-10 DIAGNOSIS — W19XXXD Unspecified fall, subsequent encounter: Secondary | ICD-10-CM | POA: Diagnosis not present

## 2016-07-10 DIAGNOSIS — Z7982 Long term (current) use of aspirin: Secondary | ICD-10-CM | POA: Diagnosis not present

## 2016-07-11 ENCOUNTER — Other Ambulatory Visit (HOSPITAL_BASED_OUTPATIENT_CLINIC_OR_DEPARTMENT_OTHER): Payer: Self-pay

## 2016-07-11 DIAGNOSIS — Z96642 Presence of left artificial hip joint: Secondary | ICD-10-CM | POA: Diagnosis not present

## 2016-07-11 DIAGNOSIS — K5090 Crohn's disease, unspecified, without complications: Secondary | ICD-10-CM | POA: Diagnosis not present

## 2016-07-11 DIAGNOSIS — Z7982 Long term (current) use of aspirin: Secondary | ICD-10-CM | POA: Diagnosis not present

## 2016-07-11 DIAGNOSIS — W19XXXD Unspecified fall, subsequent encounter: Secondary | ICD-10-CM | POA: Diagnosis not present

## 2016-07-11 DIAGNOSIS — S72002D Fracture of unspecified part of neck of left femur, subsequent encounter for closed fracture with routine healing: Secondary | ICD-10-CM | POA: Diagnosis not present

## 2016-07-11 DIAGNOSIS — G4730 Sleep apnea, unspecified: Secondary | ICD-10-CM

## 2016-07-14 DIAGNOSIS — S72002D Fracture of unspecified part of neck of left femur, subsequent encounter for closed fracture with routine healing: Secondary | ICD-10-CM | POA: Diagnosis not present

## 2016-07-14 DIAGNOSIS — K5090 Crohn's disease, unspecified, without complications: Secondary | ICD-10-CM | POA: Diagnosis not present

## 2016-07-14 DIAGNOSIS — Z7982 Long term (current) use of aspirin: Secondary | ICD-10-CM | POA: Diagnosis not present

## 2016-07-14 DIAGNOSIS — W19XXXD Unspecified fall, subsequent encounter: Secondary | ICD-10-CM | POA: Diagnosis not present

## 2016-07-14 DIAGNOSIS — Z96642 Presence of left artificial hip joint: Secondary | ICD-10-CM | POA: Diagnosis not present

## 2016-07-16 DIAGNOSIS — K5090 Crohn's disease, unspecified, without complications: Secondary | ICD-10-CM | POA: Diagnosis not present

## 2016-07-16 DIAGNOSIS — Z96642 Presence of left artificial hip joint: Secondary | ICD-10-CM | POA: Diagnosis not present

## 2016-07-16 DIAGNOSIS — S72002D Fracture of unspecified part of neck of left femur, subsequent encounter for closed fracture with routine healing: Secondary | ICD-10-CM | POA: Diagnosis not present

## 2016-07-16 DIAGNOSIS — W19XXXD Unspecified fall, subsequent encounter: Secondary | ICD-10-CM | POA: Diagnosis not present

## 2016-07-16 DIAGNOSIS — Z7982 Long term (current) use of aspirin: Secondary | ICD-10-CM | POA: Diagnosis not present

## 2016-07-18 ENCOUNTER — Ambulatory Visit: Payer: Self-pay | Admitting: Orthopedic Surgery

## 2016-07-22 DIAGNOSIS — S72002D Fracture of unspecified part of neck of left femur, subsequent encounter for closed fracture with routine healing: Secondary | ICD-10-CM | POA: Diagnosis not present

## 2016-07-22 DIAGNOSIS — Z96642 Presence of left artificial hip joint: Secondary | ICD-10-CM | POA: Diagnosis not present

## 2016-07-22 DIAGNOSIS — K5090 Crohn's disease, unspecified, without complications: Secondary | ICD-10-CM | POA: Diagnosis not present

## 2016-07-22 DIAGNOSIS — W19XXXD Unspecified fall, subsequent encounter: Secondary | ICD-10-CM | POA: Diagnosis not present

## 2016-07-22 DIAGNOSIS — Z7982 Long term (current) use of aspirin: Secondary | ICD-10-CM | POA: Diagnosis not present

## 2016-07-24 DIAGNOSIS — Z96642 Presence of left artificial hip joint: Secondary | ICD-10-CM | POA: Diagnosis not present

## 2016-07-24 DIAGNOSIS — Z7982 Long term (current) use of aspirin: Secondary | ICD-10-CM | POA: Diagnosis not present

## 2016-07-24 DIAGNOSIS — S72002D Fracture of unspecified part of neck of left femur, subsequent encounter for closed fracture with routine healing: Secondary | ICD-10-CM | POA: Diagnosis not present

## 2016-07-24 DIAGNOSIS — W19XXXD Unspecified fall, subsequent encounter: Secondary | ICD-10-CM | POA: Diagnosis not present

## 2016-07-24 DIAGNOSIS — K5090 Crohn's disease, unspecified, without complications: Secondary | ICD-10-CM | POA: Diagnosis not present

## 2016-07-28 ENCOUNTER — Ambulatory Visit (INDEPENDENT_AMBULATORY_CARE_PROVIDER_SITE_OTHER): Payer: Medicare Other | Admitting: Obstetrics & Gynecology

## 2016-07-28 ENCOUNTER — Encounter: Payer: Self-pay | Admitting: Obstetrics & Gynecology

## 2016-07-28 VITALS — BP 140/90 | HR 80 | Wt 207.0 lb

## 2016-07-28 DIAGNOSIS — N813 Complete uterovaginal prolapse: Secondary | ICD-10-CM

## 2016-07-28 DIAGNOSIS — Z4689 Encounter for fitting and adjustment of other specified devices: Secondary | ICD-10-CM | POA: Diagnosis not present

## 2016-07-28 NOTE — Progress Notes (Signed)
Chief Complaint  Patient presents with  . Pessary Check    clean    Blood pressure 140/90, pulse 80, weight 207 lb (93.9 kg).  Barbara Lucero presents today for routine follow up related to her pessary.    She uses a milex ring with support #5, originally placed 04/27/2015.  She reports no vaginal discharge or vaginal bleeding.  Exam reveals no undue vaginal mucosal pressure of breakdown, no discharge and no vaginal bleeding.  The pessary is removed, cleaned and replaced without difficulty.    Barbara Lucero will be sen back in 4 months for continued follow up.  Lazaro Arms, MD  07/28/2016 4:35 PM

## 2016-08-04 ENCOUNTER — Encounter: Payer: Self-pay | Admitting: Orthopedic Surgery

## 2016-08-04 ENCOUNTER — Ambulatory Visit (INDEPENDENT_AMBULATORY_CARE_PROVIDER_SITE_OTHER): Payer: Self-pay | Admitting: Orthopedic Surgery

## 2016-08-04 DIAGNOSIS — Z4889 Encounter for other specified surgical aftercare: Secondary | ICD-10-CM

## 2016-08-04 DIAGNOSIS — Z9889 Other specified postprocedural states: Secondary | ICD-10-CM

## 2016-08-04 DIAGNOSIS — S72002D Fracture of unspecified part of neck of left femur, subsequent encounter for closed fracture with routine healing: Secondary | ICD-10-CM

## 2016-08-04 MED ORDER — GABAPENTIN 300 MG PO CAPS: 300.0000 mg | ORAL_CAPSULE | Freq: Two times a day (BID) | ORAL | 5 refills | Status: DC

## 2016-08-04 NOTE — Progress Notes (Signed)
Encounter Diagnoses  Name Primary?  Marland Kitchen Aftercare following surgery Yes  . Closed fracture of left hip with routine healing, subsequent encounter   . Status post hip surgery     Chief Complaint  Patient presents with  . Follow-up    LEFT BIPOLAR HIP 06/06/16    No complaints at this time. Patient is concerned about increasing pain when her gabapentin was decreased from 3 mg twice a day to once a day. This was done because of ankle swelling and edema. She was placed and fluid pill and would like the gabapentin increased at least on a when necessary basis  We are going to do that provided she monitors her legs for swelling and if the swelling is not controlled by the Lasix she decreases the gabapentin again  Leg lengths are equal hip is stable flexion is 120  Follow-up in 6 weeks

## 2016-08-26 ENCOUNTER — Encounter: Payer: Self-pay | Admitting: Obstetrics & Gynecology

## 2016-08-26 ENCOUNTER — Ambulatory Visit (INDEPENDENT_AMBULATORY_CARE_PROVIDER_SITE_OTHER): Payer: Medicare Other | Admitting: Obstetrics & Gynecology

## 2016-08-26 VITALS — BP 142/78 | HR 77 | Wt 209.0 lb

## 2016-08-26 DIAGNOSIS — Z4689 Encounter for fitting and adjustment of other specified devices: Secondary | ICD-10-CM

## 2016-08-26 DIAGNOSIS — N813 Complete uterovaginal prolapse: Secondary | ICD-10-CM | POA: Diagnosis not present

## 2016-08-26 MED ORDER — POLYETHYLENE GLYCOL 3350 17 GM/SCOOP PO POWD: ORAL | 11 refills | Status: DC

## 2016-08-26 NOTE — Progress Notes (Signed)
Chief Complaint  Patient presents with  . Follow-up    pessary    Blood pressure (!) 142/78, pulse 77, weight 209 lb (94.8 kg).  Barbara Lucero presents today for routine follow up related to her pessary.   She uses a Milex ring with support #5 which was originally placed 04/27/2015 She presents today because she says since I replaced it in June 4 it has come out a little bit and is sitting a little sideways She states she did have to strain with a bowel movement and she thinks it happened after that  She reports no vaginal discharge or vaginal bleeding.  Exam reveals no undue vaginal mucosal pressure of breakdown, no discharge and no vaginal bleeding.  The pessary is removed, cleaned and replaced without difficulty.  It is fitting good today after removing and replacing I do not think going to a larger all smaller size is indicated at this point I will see her back as scheduled for her routine visit which is about 3 months Additionally on medicine and a prescription for mirror lax to try to improve her bowel movement quality   Meds ordered this encounter  Medications  . polyethylene glycol powder (GLYCOLAX/MIRALAX) powder    Sig: 1 scoop daily or as needed    Dispense:  255 g    Refill:  11     Barbara Lucero will be sen back in 3 months for continued follow up.  Lazaro Arms, MD  08/26/2016 3:06 PM

## 2016-09-09 DIAGNOSIS — N390 Urinary tract infection, site not specified: Secondary | ICD-10-CM | POA: Diagnosis not present

## 2016-09-15 ENCOUNTER — Ambulatory Visit (INDEPENDENT_AMBULATORY_CARE_PROVIDER_SITE_OTHER): Payer: Medicare Other | Admitting: Obstetrics & Gynecology

## 2016-09-15 ENCOUNTER — Encounter: Payer: Self-pay | Admitting: Obstetrics & Gynecology

## 2016-09-15 ENCOUNTER — Ambulatory Visit: Payer: Medicare Other | Admitting: Orthopedic Surgery

## 2016-09-15 VITALS — BP 148/100 | HR 82 | Wt 207.0 lb

## 2016-09-15 DIAGNOSIS — B3731 Acute candidiasis of vulva and vagina: Secondary | ICD-10-CM

## 2016-09-15 DIAGNOSIS — B372 Candidiasis of skin and nail: Secondary | ICD-10-CM | POA: Diagnosis not present

## 2016-09-15 DIAGNOSIS — B373 Candidiasis of vulva and vagina: Secondary | ICD-10-CM

## 2016-09-15 NOTE — Progress Notes (Signed)
Chief Complaint  Patient presents with  . gyn visit    break out on bottom area    Blood pressure (!) 148/100, pulse 82, weight 207 lb (93.9 kg).  76 y.o. No obstetric history on file. No LMP recorded. Patient is postmenopausal. The current method of family planning is post menopausal status.  Outpatient Encounter Medications as of 09/15/2016  Medication Sig  . acetaminophen (TYLENOL) 650 MG CR tablet Take 650 mg by mouth every 8 (eight) hours as needed for pain.  . Ascorbic Acid (VITAMIN C) 1000 MG tablet Take 1,000 mg by mouth daily.  Marland Kitchen aspirin EC 325 MG EC tablet Take 1 tablet (325 mg total) by mouth daily with breakfast.  . b complex vitamins tablet Take 1 tablet by mouth daily.  Marland Kitchen gabapentin (NEURONTIN) 300 MG capsule Take 1 capsule (300 mg total) by mouth 2 (two) times daily.  . Multiple Vitamin (MULTIVITAMIN) tablet Take 1 tablet by mouth daily.  . polyethylene glycol powder (GLYCOLAX/MIRALAX) powder 1 scoop daily or as needed  . sulfaSALAzine (AZULFIDINE) 500 MG tablet Take 500 mg by mouth 2 (two) times daily.   . vitamin E 400 UNIT capsule Take 400 Units by mouth daily.  . ondansetron (ZOFRAN) 4 MG tablet Take 1 tablet (4 mg total) by mouth every 6 (six) hours as needed for nausea. (Patient not taking: Reported on 08/26/2016)  . [DISCONTINUED] doxepin (SINEQUAN) 50 MG capsule Take 50 mg by mouth at bedtime.   . [DISCONTINUED] HYDROcodone-acetaminophen (NORCO/VICODIN) 5-325 MG tablet Take 1-2 tablets by mouth every 6 (six) hours as needed for moderate pain. (Patient not taking: Reported on 08/26/2016)  . [DISCONTINUED] psyllium (METAMUCIL) 58.6 % packet Take 1 packet by mouth at bedtime.   No facility-administered encounter medications on file as of 09/15/2016.     Subjective Barbara Lucero comes in complaining of an outbreak on her bottom for the past few weeks getting progressively worse Not related to antibiotic therapy No history of herpetic lesions She states it is red  itchy burning is not relieved with any medication or anything she stridor  Objective General WDWN female NAD Vulva:  Patient has an erythematous vulvovaginal exam consistent with Candida Vagina:  As above Painted with gentian violet both internal vaginally vulvar and perirectal   Pertinent ROS No burning with urination, frequency or urgency No nausea, vomiting or diarrhea Nor fever chills or other constitutional symptoms   Labs or studies     Impression Diagnoses this Encounter::   ICD-10-CM   1. Vulvovaginitis due to yeast B37.3   2. Candidiasis of skin B37.2     Established relevant diagnosis(es):   Plan/Recommendations: No orders of the defined types were placed in this encounter.   Labs or Scans Ordered: No orders of the defined types were placed in this encounter.   Management:: Pain within gentian violet Prescription for Diflucan daily for 7-14 days given Follow-up in 2 weeks for reevaluation  Follow up Return in about 2 weeks (around 09/29/2016).      All questions were answered.  Past Medical History:  Diagnosis Date  . Anxiety   . Arthritis   . Crohn disease (HCC)   . Depression     Past Surgical History:  Procedure Laterality Date  . BOWEL RESECTION    . HIP ARTHROPLASTY Left 06/06/2016   Procedure: ARTHROPLASTY BIPOLAR HIP (HEMIARTHROPLASTY);  Surgeon: Vickki Hearing, MD;  Location: [facility];  Service: Orthopedics;  Laterality: Left;  . lt hip replacement  OB History    Gravida Para Term Preterm AB Living             2   SAB TAB Ectopic Multiple Live Births                  No Known Allergies  Social History   Socioeconomic History  . Marital status: Married    Spouse name: None  . Number of children: None  . Years of education: None  . Highest education level: None  Social Needs  . Financial resource strain: None  . Food insecurity - worry: None  . Food insecurity - inability: None  . Transportation needs -  medical: None  . Transportation needs - non-medical: None  Occupational History  . None  Tobacco Use  . Smoking status: Never Smoker  . Smokeless tobacco: Never Used  Substance and Sexual Activity  . Alcohol use: No  . Drug use: No  . Sexual activity: No  Other Topics Concern  . None  Social History Narrative  . None    Family History  Problem Relation Age of Onset  . Breast cancer Sister   . Breast cancer Sister

## 2016-09-16 ENCOUNTER — Telehealth: Payer: Self-pay | Admitting: Obstetrics & Gynecology

## 2016-09-16 ENCOUNTER — Other Ambulatory Visit: Payer: Self-pay | Admitting: Obstetrics & Gynecology

## 2016-09-16 MED ORDER — FLUCONAZOLE 100 MG PO TABS: 100.0000 mg | ORAL_TABLET | Freq: Every day | ORAL | 0 refills | Status: DC

## 2016-09-16 NOTE — Telephone Encounter (Signed)
Patient called stating that she came in to see Dr. Despina Hidden yesterday and he was suppose prescribe her something for yeast infection, pt called her pharmacy today and they state they never received it. Please contact pt

## 2016-09-29 ENCOUNTER — Ambulatory Visit (INDEPENDENT_AMBULATORY_CARE_PROVIDER_SITE_OTHER): Payer: Medicare Other | Admitting: Orthopedic Surgery

## 2016-09-29 DIAGNOSIS — S72002D Fracture of unspecified part of neck of left femur, subsequent encounter for closed fracture with routine healing: Secondary | ICD-10-CM

## 2016-09-29 NOTE — Progress Notes (Signed)
This is a routine follow-up appointment status post left bipolar hip replacement on 06/06/2016 she is still using her walker she was on a cane preoperatively and has not been able to progress back to the same level.  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

## 2016-09-30 ENCOUNTER — Ambulatory Visit: Payer: Self-pay | Admitting: Obstetrics & Gynecology

## 2016-10-02 ENCOUNTER — Ambulatory Visit: Payer: Self-pay | Admitting: Obstetrics & Gynecology

## 2016-11-28 ENCOUNTER — Other Ambulatory Visit: Payer: Self-pay | Admitting: Obstetrics & Gynecology

## 2016-12-01 ENCOUNTER — Ambulatory Visit (INDEPENDENT_AMBULATORY_CARE_PROVIDER_SITE_OTHER): Payer: Medicare Other | Admitting: Orthopedic Surgery

## 2016-12-01 VITALS — BP 94/68 | HR 39 | Ht 63.0 in | Wt 207.0 lb

## 2016-12-01 DIAGNOSIS — Z9889 Other specified postprocedural states: Secondary | ICD-10-CM

## 2016-12-01 DIAGNOSIS — M25562 Pain in left knee: Secondary | ICD-10-CM | POA: Diagnosis not present

## 2016-12-01 DIAGNOSIS — G8929 Other chronic pain: Secondary | ICD-10-CM

## 2016-12-01 MED ORDER — METHYLPREDNISOLONE ACETATE 40 MG/ML IJ SUSP
40.0000 mg | Freq: Once | INTRAMUSCULAR | Status: AC
  Administered 2016-12-01: 40 mg via INTRA_ARTICULAR

## 2016-12-01 NOTE — Progress Notes (Signed)
Patient ID: Barbara Lucero, female   DOB: [birth year], 76 y.o.   MRN: [id number]  Chief Complaint  Patient presents with  . Follow-up    Recheck on left bipolar hip, DOS 06-06-16.    HPI Barbara Lucero is a 76 y.o. female.  She's here for a six-month checkup on her left bipolar hip but  She has a new complaint of pain left knee. She's had pain in her left knee for over 10 years. She's had an injection 3 years ago. She complains of increased pain dull aching constant left knee primarily lateral compartment with some swelling and loss of motion  Review of Systems Review of Systems  Constitutional: Negative for fever.  Musculoskeletal: Positive for arthralgias and gait problem.  Neurological: Positive for numbness.   (2 MINIMUM)  Past Medical History:  Diagnosis Date  . Anxiety   . Arthritis   . Crohn disease (HCC)   . Depression     Past Surgical History:  Procedure Laterality Date  . BOWEL RESECTION    . HIP ARTHROPLASTY Left 06/06/2016   Procedure: ARTHROPLASTY BIPOLAR HIP (HEMIARTHROPLASTY);  Surgeon: Vickki Hearing, MD;  Location: [facility];  Service: Orthopedics;  Laterality: Left;  . lt hip replacement      Social History Social History  Substance Use Topics  . Smoking status: Never Smoker  . Smokeless tobacco: Never Used  . Alcohol use No    No Known Allergies  No outpatient prescriptions have been marked as taking for the 12/01/16 encounter (Office Visit) with Vickki Hearing, MD.      Physical Exam Physical Exam 1.BP 94/68   Pulse (!) 39   Ht  (1.6 m)   Wt 207 lb (93.9 kg)   BMI 36.67 kg/m   2. Gen. appearance. The patient is well-developed and well-nourished, grooming and hygiene are normal. There are no gross congenital abnormalities  3. The patient is alert and oriented to person place and time  4. Mood and affect are normal  5. Ambulation quad cane supported   Examination reveals the following: 6. On inspection we find valgus knee and  pes planovalgus left knee and foot with lateral joint line tenderness   7. With the range of motion of  5-110  8. Stability tests were normal    9. Strength tests revealed grade 5 motor strength  10. Skin is discolored mid Tibia down this is chronic venous discoloration 11. Sensation remains intact  12 Vascular system shows no peripheral edema  Hip flexion remains normal but some discomfort  MEDICAL DECISION MAKING:    Data Reviewed No new data  Assessment Encounter Diagnoses  Name Primary?  . Status post hip surgery Yes  . Chronic pain of left knee     Plan Procedure note left knee injection verbal consent was obtained to inject left knee joint  Timeout was completed to confirm the site of injection  The medications used were 40 mg of Depo-Medrol and 1% lidocaine 3 cc  Anesthesia was provided by ethyl chloride and the skin was prepped with alcohol.  After cleaning the skin with alcohol a 20-gauge needle was used to inject the left knee joint. There were no complications. A sterile bandage was applied.  Hip: continue wbat gait as tolerated   6 months fu   Barbara Lucero 12/01/2016, 4:31 PM

## 2016-12-01 NOTE — Patient Instructions (Signed)

## 2017-03-30 ENCOUNTER — Ambulatory Visit: Payer: Medicare Other | Admitting: Obstetrics & Gynecology

## 2017-03-30 ENCOUNTER — Encounter: Payer: Self-pay | Admitting: Obstetrics & Gynecology

## 2017-03-30 VITALS — BP 130/70 | HR 80 | Ht 63.0 in | Wt 212.0 lb

## 2017-03-30 DIAGNOSIS — Z466 Encounter for fitting and adjustment of urinary device: Secondary | ICD-10-CM | POA: Diagnosis not present

## 2017-03-30 DIAGNOSIS — N813 Complete uterovaginal prolapse: Secondary | ICD-10-CM

## 2017-03-30 DIAGNOSIS — Z4689 Encounter for fitting and adjustment of other specified devices: Secondary | ICD-10-CM

## 2017-03-30 NOTE — Progress Notes (Signed)
  Patient ID: Barbara Lucero, female   DOB: 03-Jan-1941, 77 y.o.   MRN: [id number] Barbara Lucero is in today for follow-up related to her pessary She uses a Milex ring with support #5 which was originally placed April 27, 2015 Since that time she has had occasional difficulty with it being loose but overall is doing well with that She denies any bleeding vaginal odor vaginal discharge  Blood pressure 130/70, pulse 80, height 5\' 3"  (1.6 m), weight 212 lb (96.2 kg).  On exam she has normal external genitalia The pessary was removed without difficulty and washed with warm water and soap There is no unusual mucosal breakdown or bleeding noted from the vagina The pessary is replaced into the vagina without difficulty and is well seated  Barbara Lucero will be seen back in 4 months for routine pessary maintenance or prior to that if needed

## 2017-06-01 DIAGNOSIS — Z96642 Presence of left artificial hip joint: Secondary | ICD-10-CM | POA: Insufficient documentation

## 2017-06-03 ENCOUNTER — Ambulatory Visit (INDEPENDENT_AMBULATORY_CARE_PROVIDER_SITE_OTHER): Payer: Medicare Other

## 2017-06-03 ENCOUNTER — Ambulatory Visit: Payer: Medicare Other | Admitting: Orthopedic Surgery

## 2017-06-03 VITALS — BP 121/76 | HR 50 | Ht 63.0 in | Wt 212.0 lb

## 2017-06-03 DIAGNOSIS — M545 Low back pain, unspecified: Secondary | ICD-10-CM

## 2017-06-03 DIAGNOSIS — Z8781 Personal history of (healed) traumatic fracture: Secondary | ICD-10-CM

## 2017-06-03 DIAGNOSIS — G8929 Other chronic pain: Secondary | ICD-10-CM

## 2017-06-03 DIAGNOSIS — Z96642 Presence of left artificial hip joint: Secondary | ICD-10-CM

## 2017-06-03 NOTE — Progress Notes (Signed)
Chief Complaint  Patient presents with  . Follow-up    Recheck on left hip, DOS 06-06-16.    History 77 years old had a bipolar replacement last year  Complains of very minimal discomfort in her hip but does complain of back and left leg pain would like a back brace  Review of systems no numbness or tingling in the right or left leg  Exam BP 121/76   Pulse (!) 50   Ht 5\' 3"  (1.6 m)   Wt 212 lb (96.2 kg)   BMI 37.55 kg/m  Is awake she is alert she is oriented her spirits are good her gait is supported by a cane she has good hip flexion she has bilateral external rotation of the hips leg lengths are equal hip is stable strength in flexion of the hip is normal  No neurovascular deficits are noted  She does have some palpable lower back tenderness  X-ray today of the pelvis and prosthesis show no loosening  Encounter Diagnoses  Name Primary?  . 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 History of fracture of left hip Yes  . Chronic midline low back pain without sciatica    Back brace support ordered as well  Follow-up as needed

## 2017-07-06 DIAGNOSIS — R600 Localized edema: Secondary | ICD-10-CM | POA: Diagnosis not present

## 2017-07-06 DIAGNOSIS — K5090 Crohn's disease, unspecified, without complications: Secondary | ICD-10-CM | POA: Diagnosis not present

## 2017-07-06 DIAGNOSIS — L089 Local infection of the skin and subcutaneous tissue, unspecified: Secondary | ICD-10-CM | POA: Diagnosis not present

## 2017-07-15 DIAGNOSIS — R600 Localized edema: Secondary | ICD-10-CM | POA: Diagnosis not present

## 2017-07-15 DIAGNOSIS — K5090 Crohn's disease, unspecified, without complications: Secondary | ICD-10-CM | POA: Diagnosis not present

## 2017-07-15 DIAGNOSIS — L089 Local infection of the skin and subcutaneous tissue, unspecified: Secondary | ICD-10-CM | POA: Diagnosis not present

## 2017-07-27 ENCOUNTER — Ambulatory Visit (HOSPITAL_COMMUNITY)
Admission: RE | Admit: 2017-07-27 | Discharge: 2017-07-27 | Disposition: A | Discharge status: Home / Self Care | Payer: Medicare Other | Source: Ambulatory Visit | Attending: Pulmonary Disease | Admitting: Pulmonary Disease

## 2017-07-27 ENCOUNTER — Other Ambulatory Visit (HOSPITAL_COMMUNITY): Payer: Self-pay | Admitting: Pulmonary Disease

## 2017-07-27 DIAGNOSIS — I82402 Acute embolism and thrombosis of unspecified deep veins of left lower extremity: Secondary | ICD-10-CM | POA: Insufficient documentation

## 2017-07-27 DIAGNOSIS — I82409 Acute embolism and thrombosis of unspecified deep veins of unspecified lower extremity: Secondary | ICD-10-CM

## 2017-07-27 DIAGNOSIS — K5090 Crohn's disease, unspecified, without complications: Secondary | ICD-10-CM | POA: Diagnosis not present

## 2017-07-27 DIAGNOSIS — M7989 Other specified soft tissue disorders: Secondary | ICD-10-CM

## 2017-07-27 DIAGNOSIS — R2242 Localized swelling, mass and lump, left lower limb: Secondary | ICD-10-CM | POA: Diagnosis present

## 2017-07-27 DIAGNOSIS — I82411 Acute embolism and thrombosis of right femoral vein: Secondary | ICD-10-CM | POA: Diagnosis not present

## 2017-07-27 DIAGNOSIS — I82432 Acute embolism and thrombosis of left popliteal vein: Secondary | ICD-10-CM | POA: Insufficient documentation

## 2017-07-27 DIAGNOSIS — I82412 Acute embolism and thrombosis of left femoral vein: Secondary | ICD-10-CM | POA: Diagnosis not present

## 2017-07-27 DIAGNOSIS — L089 Local infection of the skin and subcutaneous tissue, unspecified: Secondary | ICD-10-CM | POA: Diagnosis not present

## 2017-07-27 HISTORY — DX: Acute embolism and thrombosis of unspecified deep veins of unspecified lower extremity: I82.409

## 2017-07-28 ENCOUNTER — Encounter: Payer: Self-pay | Admitting: Obstetrics & Gynecology

## 2017-07-28 ENCOUNTER — Ambulatory Visit: Payer: Self-pay | Admitting: Obstetrics & Gynecology

## 2017-08-17 ENCOUNTER — Other Ambulatory Visit: Payer: Self-pay | Admitting: Obstetrics & Gynecology

## 2017-08-31 ENCOUNTER — Ambulatory Visit: Payer: Medicare Other | Admitting: Obstetrics & Gynecology

## 2017-08-31 ENCOUNTER — Encounter: Payer: Self-pay | Admitting: Obstetrics & Gynecology

## 2017-08-31 VITALS — BP 155/78 | HR 81 | Ht 63.0 in | Wt 207.0 lb

## 2017-08-31 DIAGNOSIS — Z4689 Encounter for fitting and adjustment of other specified devices: Secondary | ICD-10-CM | POA: Diagnosis not present

## 2017-08-31 DIAGNOSIS — N813 Complete uterovaginal prolapse: Secondary | ICD-10-CM | POA: Diagnosis not present

## 2017-08-31 NOTE — Progress Notes (Signed)
Chief Complaint  Patient presents with  . Pessary Check    Blood pressure (!) 155/78, pulse 81, height 5\' 3"  (1.6 m), weight 207 lb (93.9 kg).  Barbara Lucero presents today for routine follow up related to her pessary.   She uses a milex ring with support #5 She reports no vaginal discharge or vaginal bleeding.  Exam reveals no undue vaginal mucosal pressure of breakdown, no discharge and no vaginal bleeding.  The pessary is removed, cleaned and replaced without difficulty.    Barbara Lucero will be sen back in 4 months for continued follow up.  Lazaro Arms, MD  08/31/2017 3:21 PM

## 2017-09-14 DIAGNOSIS — I82402 Acute embolism and thrombosis of unspecified deep veins of left lower extremity: Secondary | ICD-10-CM | POA: Diagnosis not present

## 2017-09-14 DIAGNOSIS — K5090 Crohn's disease, unspecified, without complications: Secondary | ICD-10-CM | POA: Diagnosis not present

## 2017-09-14 DIAGNOSIS — S72002D Fracture of unspecified part of neck of left femur, subsequent encounter for closed fracture with routine healing: Secondary | ICD-10-CM | POA: Diagnosis not present

## 2017-10-05 ENCOUNTER — Ambulatory Visit: Payer: Medicare Other | Admitting: Surgery

## 2017-10-05 ENCOUNTER — Other Ambulatory Visit: Payer: Self-pay

## 2017-10-05 ENCOUNTER — Encounter: Payer: Self-pay | Admitting: Surgery

## 2017-10-05 VITALS — BP 178/93 | HR 71 | Temp 97.5°F | Resp 16 | Ht 66.0 in | Wt 208.0 lb

## 2017-10-05 DIAGNOSIS — I82412 Acute embolism and thrombosis of left femoral vein: Secondary | ICD-10-CM | POA: Diagnosis not present

## 2017-10-05 MED ORDER — CEPHALEXIN 500 MG PO CAPS: 500.0000 mg | ORAL_CAPSULE | Freq: Three times a day (TID) | ORAL | 0 refills | Status: DC

## 2017-10-05 NOTE — Progress Notes (Signed)
[facility]  Patient name: Barbara Lucero MRN: [id number] DOB: 11-21-1940 Sex: female   REQUESTING PROVIDER:    Dr. Juanetta Gosling   REASON FOR CONSULT:    DVT  HISTORY OF PRESENT ILLNESS:   Barbara Lucero is a 77 y.o. female, who is referred today for evaluation of a left leg DVT.  This occurred in June 2019.  It appears to be unprovoked.  She is having difficulty with swelling and pain in her leg now.  She is not using compression.  She does suffer from Crohn's disease.  She does have a history of a hip fracture approximately a year ago which she is recovering from.  PAST MEDICAL HISTORY    Past Medical History:  Diagnosis Date  . Anxiety   . Arthritis   . Crohn disease (HCC)   . Depression      FAMILY HISTORY   Family History  Problem Relation Age of Onset  . Breast cancer Sister   . Breast cancer Sister     SOCIAL HISTORY:   Social History   Socioeconomic History  . Marital status: Married    Spouse name: Not on file  . Number of children: Not on file  . Years of education: Not on file  . Highest education level: Not on file  Occupational History  . Not on file  Social Needs  . Financial resource strain: Not on file  . Food insecurity:    Worry: Not on file    Inability: Not on file  . Transportation needs:    Medical: Not on file    Non-medical: Not on file  Tobacco Use  . Smoking status: Never Smoker  . Smokeless tobacco: Never Used  Substance and Sexual Activity  . Alcohol use: No  . Drug use: No  . Sexual activity: Never  Lifestyle  . Physical activity:    Days per week: Not on file    Minutes per session: Not on file  . Stress: Not on file  Relationships  . Social connections:    Talks on phone: Not on file    Gets together: Not on file    Attends religious service: Not on file    Active member of club or organization: Not on file    Attends meetings of clubs or  organizations: Not on file    Relationship status: Not on file  . Intimate partner violence:    Fear of current or ex partner: Not on file    Emotionally abused: Not on file    Physically abused: Not on file    Forced sexual activity: Not on file  Other Topics Concern  . Not on file  Social History Narrative  . Not on file    ALLERGIES:    No Known Allergies  CURRENT MEDICATIONS:    Current Outpatient Medications  Medication Sig Dispense Refill  . acetaminophen (TYLENOL) 650 MG CR tablet Take 650 mg by mouth every 8 (eight) hours as needed for pain.    . Ascorbic Acid (VITAMIN C) 1000 MG tablet Take 1,000 mg by mouth daily.    Marland Kitchen b complex vitamins tablet Take 1 tablet by mouth daily.    Marland Kitchen doxepin (SINEQUAN) 50 MG capsule     . ELIQUIS 5 MG TABS tablet     . Multiple Vitamin (MULTIVITAMIN) tablet Take 1 tablet by mouth daily.    . polyethylene glycol powder (GLYCOLAX/MIRALAX) powder 1 scoop daily or as needed 255  g 11  . sulfaSALAzine (AZULFIDINE) 500 MG tablet Take 500 mg by mouth 2 (two) times daily.     . vitamin E 400 UNIT capsule Take 400 Units by mouth daily.    Marland Kitchen aspirin EC 325 MG EC tablet Take 1 tablet (325 mg total) by mouth daily with breakfast. (Patient not taking: Reported on 10/05/2017) 30 tablet 0  . cephALEXin (KEFLEX) 500 MG capsule     . doxycycline (VIBRA-TABS) 100 MG tablet     . fluconazole (DIFLUCAN) 100 MG tablet TAKE 1 TABLET BY MOUTH ONCE A DAY. (Patient not taking: Reported on 10/05/2017) 7 tablet 0  . gabapentin (NEURONTIN) 300 MG capsule Take 1 capsule (300 mg total) by mouth 2 (two) times daily. (Patient not taking: Reported on 10/05/2017) 90 capsule 5  . ondansetron (ZOFRAN) 4 MG tablet Take 1 tablet (4 mg total) by mouth every 6 (six) hours as needed for nausea. (Patient not taking: Reported on 08/26/2016) 20 tablet 0  . sulfamethoxazole-trimethoprim (BACTRIM DS,SEPTRA DS) 800-160 MG tablet      No current facility-administered medications for this  visit.     REVIEW OF SYSTEMS:   [X]  denotes positive finding, [ ]  denotes negative finding Cardiac  Comments:  Chest pain or chest pressure:    Shortness of breath upon exertion:    Short of breath when lying flat:    Irregular heart rhythm:        Vascular    Pain in calf, thigh, or hip brought on by ambulation: x   Pain in feet at night that wakes you up from your sleep:  x   Blood clot in your veins: x   Leg swelling:  x       Pulmonary    Oxygen at home:    Productive cough:     Wheezing:         Neurologic    Sudden weakness in arms or legs:  x   Sudden numbness in arms or legs:  x   Sudden onset of difficulty speaking or slurred speech:    Temporary loss of vision in one eye:     Problems with dizziness:         Gastrointestinal    Blood in stool:      Vomited blood:         Genitourinary    Burning when urinating:     Blood in urine:        Psychiatric    Major depression:         Hematologic    Bleeding problems:    Problems with blood clotting too easily:        Skin    Rashes or ulcers: x       Constitutional    Fever or chills: x    PHYSICAL EXAM:   Vitals:   10/05/17 1325 10/05/17 1330  BP: (!) 176/96 (!) 178/93  Pulse: 71 71  Resp: 16   Temp: (!) 97.5 F (36.4 C)   TempSrc: Oral   SpO2: 91%   Weight: 208 lb (94.3 kg)   Height: 5\' 6"  (1.676 m)     GENERAL: The patient is a well-nourished female, in no acute distress. The vital signs are documented above. CARDIAC: There is a regular rate and rhythm.  VASCULAR: Nonpalpable pedal pulses bilaterally.  Edematous bilateral lower extremity, left greater than right.  Left leg does have blanching erythema. PULMONARY: Nonlabored respirations MUSCULOSKELETAL: There are no major deformities or cyanosis.  NEUROLOGIC: No focal weakness or paresthesias are detected. SKIN: Erythema to the left lower leg with no open wounds PSYCHIATRIC: The patient has a normal affect.  STUDIES:   Her ultrasound  from June 2019 shows DVT in the left common femoral, profundofemoral, profundofemoral and popliteal veins.  ASSESSMENT and PLAN   Left leg DVT: The patient is outside the window where thrombolytic therapy would be recommended.  At this point, she should be treated with anticoagulation, leg elevation, and compression.  I have given her prescription for 20-30 compression stockings.  I am concerned also that she is developing recurrent cellulitis and so I am giving her an additional prescription for Keflex.  She will contact me if she has any other concerns or questions  This appears to have been a unprovoked DVT, however with her history of Crohn's disease, this could be contributory.  She essentially would benefit from outpatient hematology referral.   Durene Cal, MD [facility] [phone] Pager [phone]

## 2017-12-16 ENCOUNTER — Other Ambulatory Visit (HOSPITAL_COMMUNITY): Payer: Self-pay | Admitting: Pulmonary Disease

## 2017-12-16 DIAGNOSIS — I82402 Acute embolism and thrombosis of unspecified deep veins of left lower extremity: Secondary | ICD-10-CM

## 2017-12-18 ENCOUNTER — Ambulatory Visit (HOSPITAL_COMMUNITY)
Admission: RE | Admit: 2017-12-18 | Discharge: 2017-12-18 | Disposition: A | Discharge status: Home / Self Care | Payer: Medicare Other | Source: Ambulatory Visit | Attending: Pulmonary Disease | Admitting: Pulmonary Disease

## 2017-12-18 DIAGNOSIS — I82402 Acute embolism and thrombosis of unspecified deep veins of left lower extremity: Secondary | ICD-10-CM | POA: Insufficient documentation

## 2017-12-18 DIAGNOSIS — Z86718 Personal history of other venous thrombosis and embolism: Secondary | ICD-10-CM | POA: Diagnosis not present

## 2017-12-31 ENCOUNTER — Ambulatory Visit: Payer: Self-pay | Admitting: Obstetrics & Gynecology

## 2018-01-01 ENCOUNTER — Ambulatory Visit: Payer: Medicare Other | Admitting: Obstetrics & Gynecology

## 2018-01-01 ENCOUNTER — Encounter: Payer: Self-pay | Admitting: Obstetrics & Gynecology

## 2018-01-01 VITALS — BP 150/84 | HR 76 | Ht 63.0 in | Wt 209.0 lb

## 2018-01-01 DIAGNOSIS — Z4689 Encounter for fitting and adjustment of other specified devices: Secondary | ICD-10-CM

## 2018-01-01 DIAGNOSIS — N813 Complete uterovaginal prolapse: Secondary | ICD-10-CM

## 2018-01-01 NOTE — Progress Notes (Signed)
Chief Complaint  Patient presents with  . Pessary Check    Blood pressure (!) 150/84, pulse 76, height 5\' 3"  (1.6 m), weight 209 lb (94.8 kg).  Barbara Lucero presents today for routine follow up related to her pessary.   She uses a Milex ring with support #5 She reports no vaginal discharge or vaginal bleeding.  Exam reveals no undue vaginal mucosal pressure of breakdown, no discharge and no vaginal bleeding.  The pessary is removed, cleaned and replaced without difficulty.    [name] will be sen back in 4 months for continued follow up.  Lazaro Arms, MD  01/01/2018 1:15 PM

## 2018-01-26 DIAGNOSIS — K5090 Crohn's disease, unspecified, without complications: Secondary | ICD-10-CM | POA: Diagnosis not present

## 2018-01-26 DIAGNOSIS — L089 Local infection of the skin and subcutaneous tissue, unspecified: Secondary | ICD-10-CM | POA: Diagnosis not present

## 2018-01-26 DIAGNOSIS — Z23 Encounter for immunization: Secondary | ICD-10-CM | POA: Diagnosis not present

## 2018-03-12 IMAGING — CT CT HEAD W/O CM
3 series · 15 of 47 positions shown, 18 images · non-contrast
Comparison: None.

CLINICAL DATA: Fall last night with head injury. Initial encounter.

EXAM:
CT HEAD WITHOUT CONTRAST
TECHNIQUE: Contiguous axial images were obtained from the base of the skull
through the vertex without intravenous contrast.

[Series 2: head trauma wo · axial · 0.43mm/px · z∈[-16,+109]mm · 9 of 31 slices shown, 12 images]
[im 3/31  brain]
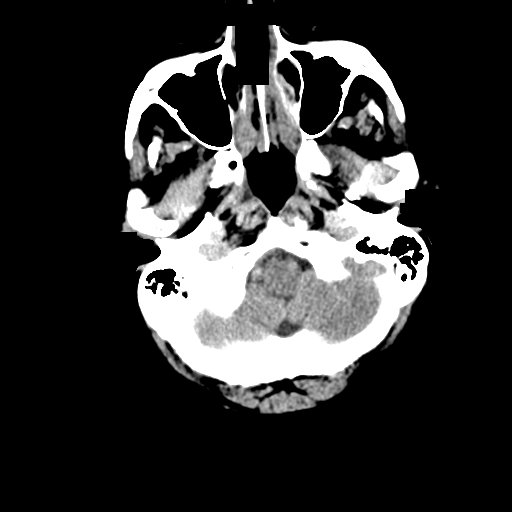
[im 3/31  bone]
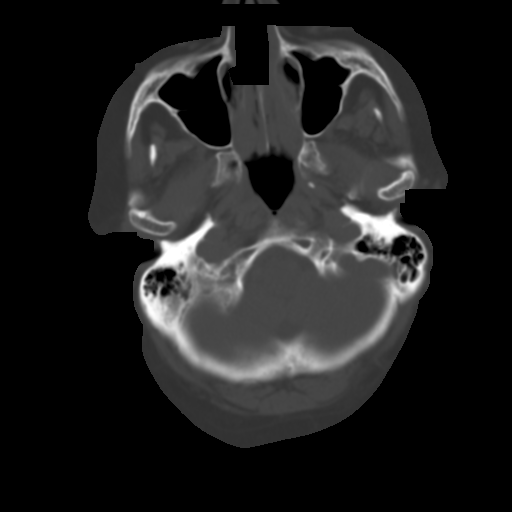
[im 6/31  brain]
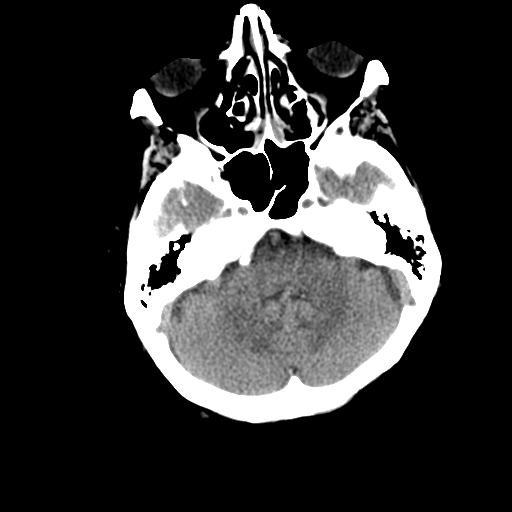
[im 9/31  brain]
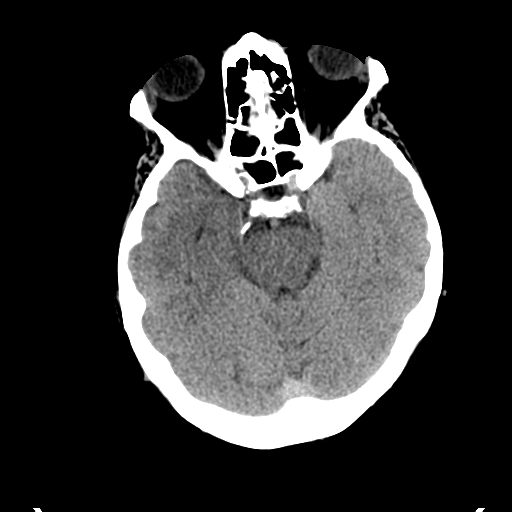
[im 12/31  brain]
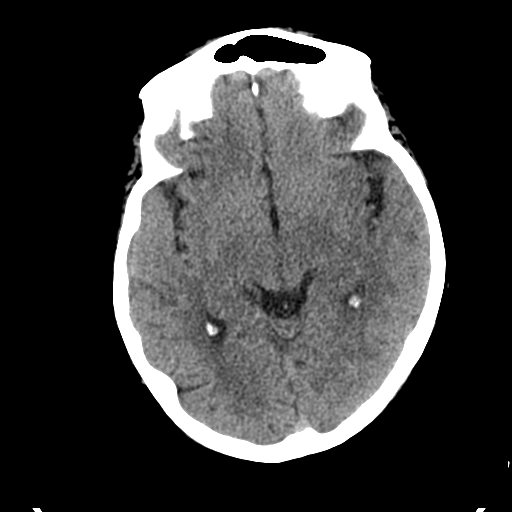
[im 16/31  brain]
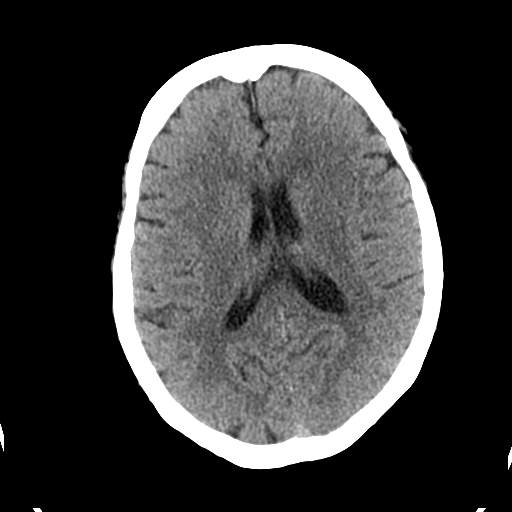
[im 16/31  bone]
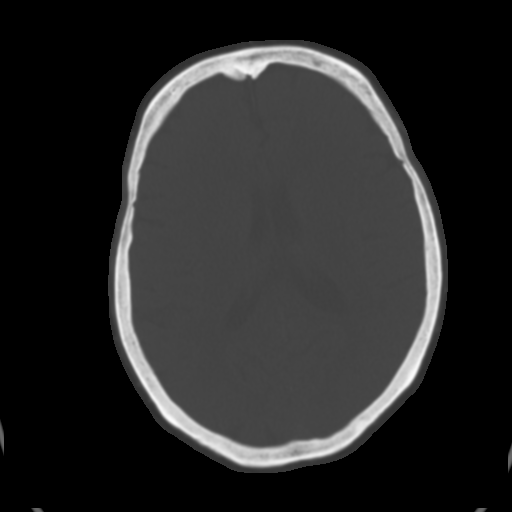
[im 19/31  brain]
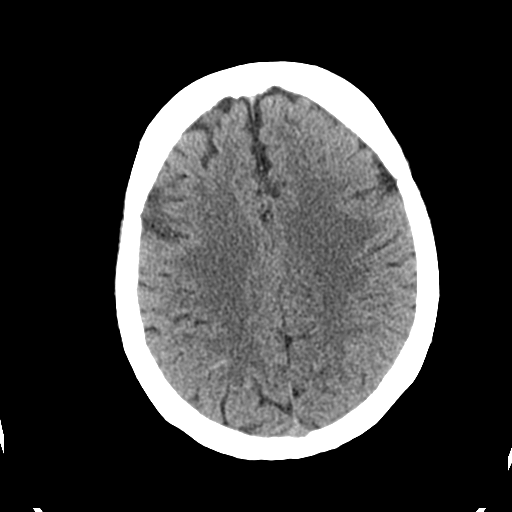
[im 22/31  brain]
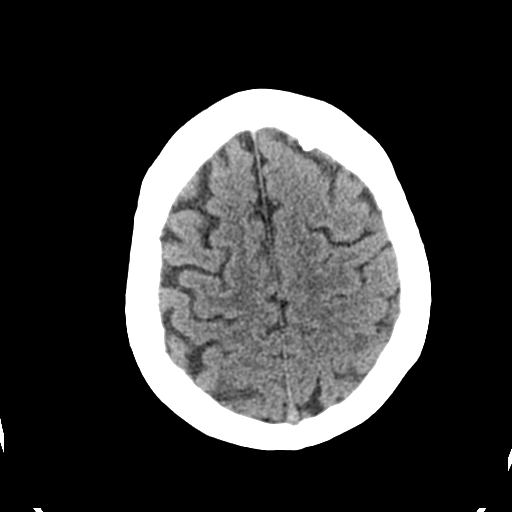
[im 25/31  brain]
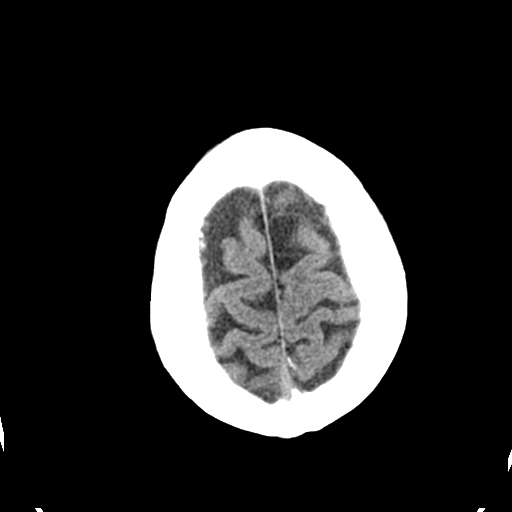
[im 28/31  brain]
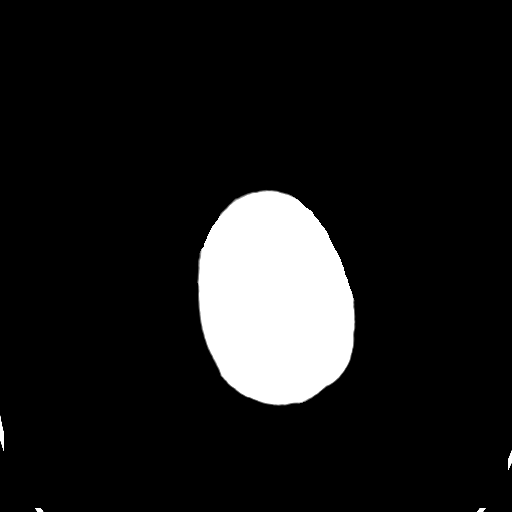
[im 28/31  bone]
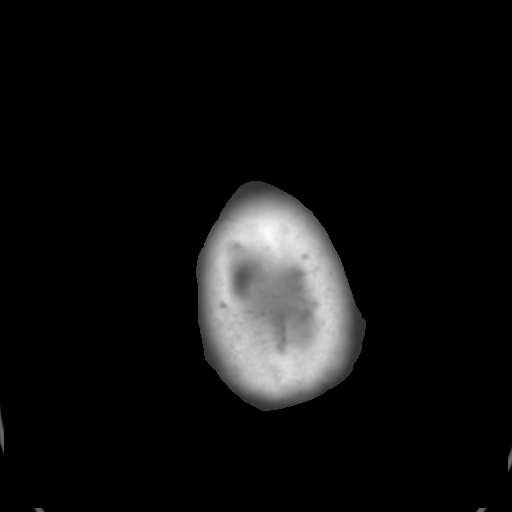

[Series 4: coronal soft tissue · coronal · 0.31mm/px · 3 of 79 slices shown]
[im 27/79  brain]
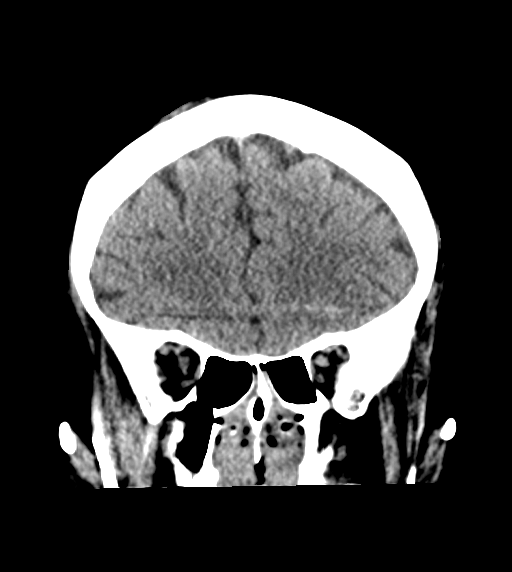
[im 35/79  brain]
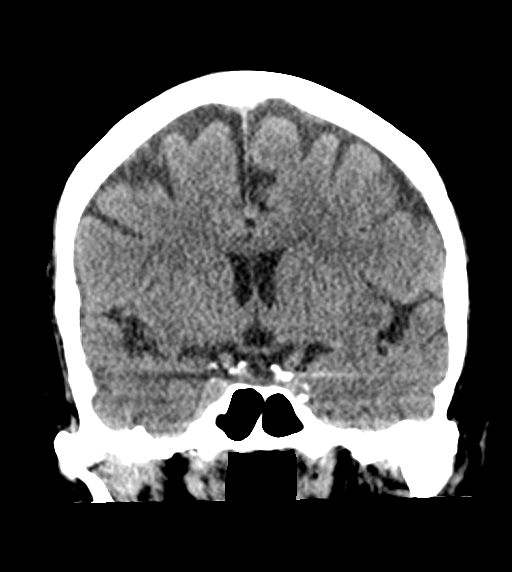
[im 44/79  brain]
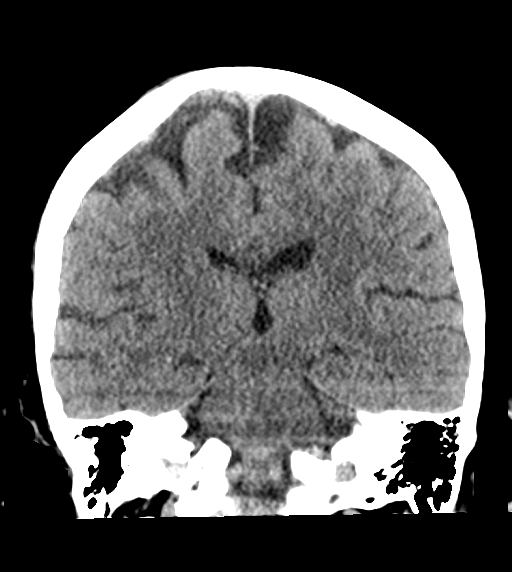

[Series 5: sagittal soft tissue · sagittal · 0.35mm/px · 3 of 56 slices shown]
[im 19/56  brain]
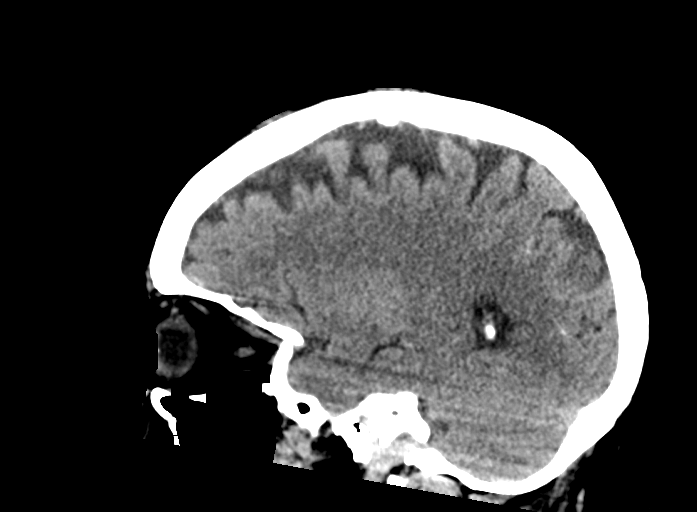
[im 28/56  brain]
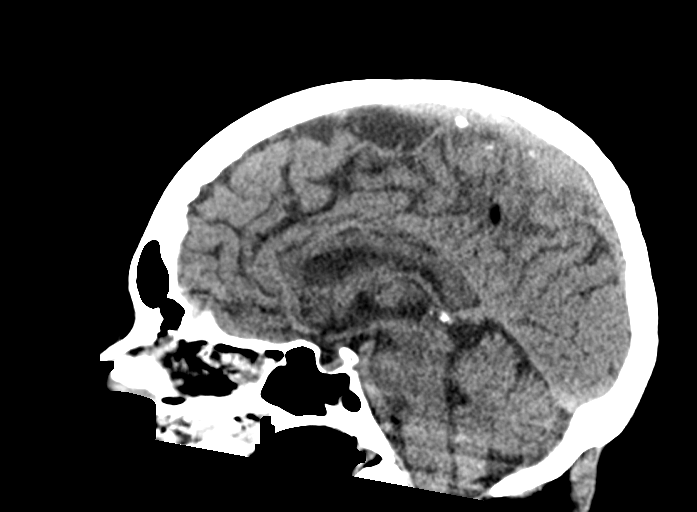
[im 37/56  brain]
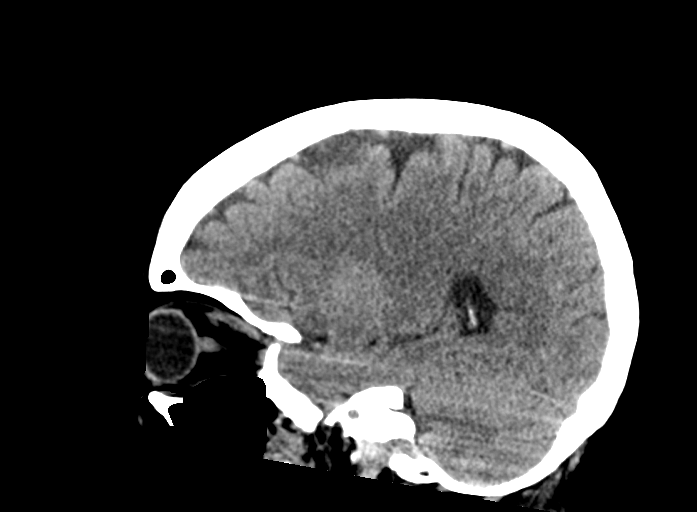

[15 of 47 positions shown; findings below may reference images not displayed]

FINDINGS: Brain: No evidence of acute infarction, hemorrhage, hydrocephalus,
extra-axial collection or mass lesion/mass effect. Remote lacunar
infarct in the right caudate head.

Vascular: Atherosclerotic calcification.  The vessel hyperdensity.

Skull: Possible mild scalp injury near the vertex. Negative for
fracture.

Sinuses/Orbits: Bilateral cataract resection. No posttraumatic
finding.
IMPRESSION: No evidence of intracranial injury or fracture.

## 2018-03-17 IMAGING — CR DG CHEST 1V PORT
1 series · 1 of 1 positions shown · non-contrast
Comparison: 06/05/2016

CLINICAL DATA: Low oxygen saturation.

EXAM:
PORTABLE CHEST 1 VIEW

[portable]
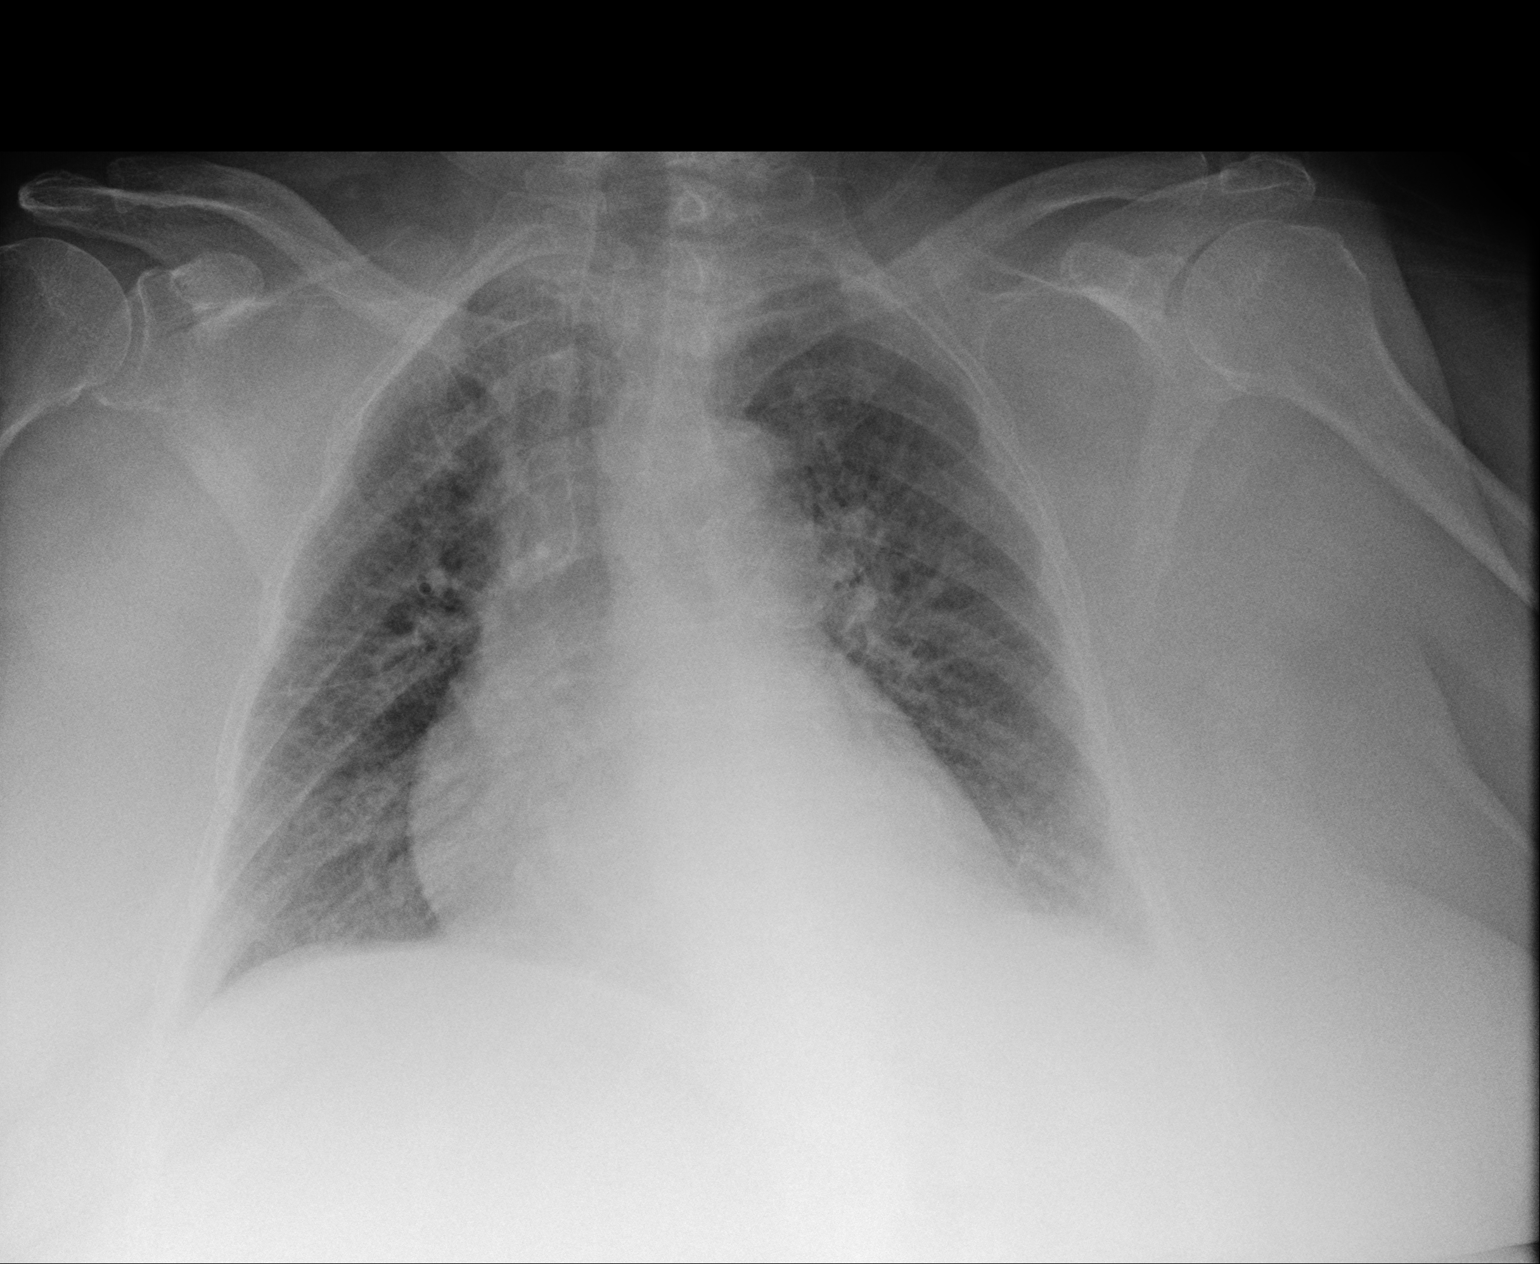

[1 of 1 positions shown; findings below may reference images not displayed]

FINDINGS: Shallow inspiration. Borderline heart size and pulmonary
vascularity. These are likely normal for technique. No focal
airspace disease or consolidation. No blunting of costophrenic
angles. No pneumothorax. No significant change since prior study.
IMPRESSION: No active disease.

## 2018-04-27 DIAGNOSIS — L089 Local infection of the skin and subcutaneous tissue, unspecified: Secondary | ICD-10-CM | POA: Diagnosis not present

## 2018-04-27 DIAGNOSIS — M1990 Unspecified osteoarthritis, unspecified site: Secondary | ICD-10-CM | POA: Diagnosis not present

## 2018-04-27 DIAGNOSIS — M545 Low back pain: Secondary | ICD-10-CM | POA: Diagnosis not present

## 2018-05-03 ENCOUNTER — Ambulatory Visit: Payer: Self-pay | Admitting: Obstetrics & Gynecology

## 2018-05-07 ENCOUNTER — Other Ambulatory Visit: Payer: Self-pay

## 2018-05-07 ENCOUNTER — Ambulatory Visit (INDEPENDENT_AMBULATORY_CARE_PROVIDER_SITE_OTHER): Payer: Medicare Other | Admitting: Obstetrics & Gynecology

## 2018-05-07 ENCOUNTER — Encounter: Payer: Self-pay | Admitting: Obstetrics & Gynecology

## 2018-05-07 VITALS — BP 158/85 | HR 91 | Ht 63.0 in | Wt 199.0 lb

## 2018-05-07 DIAGNOSIS — N813 Complete uterovaginal prolapse: Secondary | ICD-10-CM

## 2018-05-07 DIAGNOSIS — Z4689 Encounter for fitting and adjustment of other specified devices: Secondary | ICD-10-CM

## 2018-05-07 NOTE — Progress Notes (Signed)
Chief Complaint  Patient presents with  . pessary maintenance    Blood pressure (!) 158/85, pulse 91, height 5\' 3"  (1.6 m), weight 199 lb (90.3 kg).  Barbara Lucero presents today for routine follow up related to her pessary.   She uses a Milex ring with support #5 She reports no vaginal discharge or vaginal bleeding.  Exam reveals no undue vaginal mucosal pressure of breakdown, no discharge and no vaginal bleeding.  The pessary is removed, cleaned and replaced without difficulty.    [name] will be sen back in 4 months for continued follow up.  Return in about 4 months (around 09/01/2018) for Follow up, with Dr Despina Hidden @3 :00 pm.   Lazaro Arms, MD  05/07/2018 11:47 AM

## 2018-07-29 DIAGNOSIS — K210 Gastro-esophageal reflux disease with esophagitis: Secondary | ICD-10-CM | POA: Diagnosis not present

## 2018-07-29 DIAGNOSIS — I82402 Acute embolism and thrombosis of unspecified deep veins of left lower extremity: Secondary | ICD-10-CM | POA: Diagnosis not present

## 2018-07-29 DIAGNOSIS — L03115 Cellulitis of right lower limb: Secondary | ICD-10-CM | POA: Diagnosis not present

## 2018-07-29 DIAGNOSIS — K5090 Crohn's disease, unspecified, without complications: Secondary | ICD-10-CM | POA: Diagnosis not present

## 2018-08-31 ENCOUNTER — Telehealth: Payer: Self-pay | Admitting: *Deleted

## 2018-08-31 NOTE — Telephone Encounter (Signed)
I made patient aware of the covid restrictions and appt info.

## 2018-09-01 ENCOUNTER — Encounter: Payer: Self-pay | Admitting: Obstetrics & Gynecology

## 2018-09-01 ENCOUNTER — Other Ambulatory Visit: Payer: Self-pay

## 2018-09-01 ENCOUNTER — Ambulatory Visit (INDEPENDENT_AMBULATORY_CARE_PROVIDER_SITE_OTHER): Payer: Medicare Other | Admitting: Obstetrics & Gynecology

## 2018-09-01 VITALS — BP 150/90 | HR 75 | Wt 190.0 lb

## 2018-09-01 DIAGNOSIS — Z4689 Encounter for fitting and adjustment of other specified devices: Secondary | ICD-10-CM

## 2018-09-01 DIAGNOSIS — N813 Complete uterovaginal prolapse: Secondary | ICD-10-CM

## 2018-09-01 NOTE — Progress Notes (Signed)
   Chief Complaint  Patient presents with  . Pessary Check    There were no vitals taken for this visit.  Barbara Lucero presents today for routine follow up related to her pessary.   She uses a milex ring with support #5 She reports no vaginal discharge or vaginal bleeding.  Exam reveals no undue vaginal mucosal pressure of breakdown, no discharge and no vaginal bleeding.  The pessary is removed, cleaned and replaced without difficulty.    Barbara Lucero will be sen back in 4 months for continued follow up.  Florian Buff, MD  09/01/2018 3:22 PM

## 2018-10-04 DIAGNOSIS — N3281 Overactive bladder: Secondary | ICD-10-CM | POA: Diagnosis not present

## 2018-10-04 DIAGNOSIS — K5090 Crohn's disease, unspecified, without complications: Secondary | ICD-10-CM | POA: Diagnosis not present

## 2018-10-04 DIAGNOSIS — I82402 Acute embolism and thrombosis of unspecified deep veins of left lower extremity: Secondary | ICD-10-CM | POA: Diagnosis not present

## 2018-12-31 ENCOUNTER — Telehealth: Payer: Self-pay | Admitting: Obstetrics & Gynecology

## 2018-12-31 NOTE — Telephone Encounter (Signed)

## 2019-01-03 ENCOUNTER — Encounter: Payer: Self-pay | Admitting: Obstetrics & Gynecology

## 2019-01-03 ENCOUNTER — Other Ambulatory Visit: Payer: Self-pay

## 2019-01-03 ENCOUNTER — Ambulatory Visit (INDEPENDENT_AMBULATORY_CARE_PROVIDER_SITE_OTHER): Payer: Medicare Other | Admitting: Obstetrics & Gynecology

## 2019-01-03 VITALS — BP 156/82 | HR 78 | Ht 65.0 in | Wt 197.0 lb

## 2019-01-03 DIAGNOSIS — N813 Complete uterovaginal prolapse: Secondary | ICD-10-CM

## 2019-01-03 DIAGNOSIS — Z4689 Encounter for fitting and adjustment of other specified devices: Secondary | ICD-10-CM | POA: Diagnosis not present

## 2019-01-03 NOTE — Progress Notes (Signed)
Chief Complaint  Patient presents with  . Pessary Check    Blood pressure (!) 156/82, pulse 78, height 5\' 5"  (1.651 m), weight 197 lb (89.4 kg).  Barbara Lucero presents today for routine follow up related to her pessary.   She uses a Milex ring with support #5 She reports no vaginal discharge or vaginal bleeding.  Exam reveals no undue vaginal mucosal pressure of breakdown, no discharge and no vaginal bleeding.  The pessary is removed, cleaned and replaced without difficulty.    Barbara Lucero will be sen back in 4 months for continued follow up.  Florian Buff, MD  01/03/2019 3:22 PM

## 2019-05-03 ENCOUNTER — Ambulatory Visit: Payer: Medicare Other | Admitting: Obstetrics & Gynecology

## 2019-05-16 ENCOUNTER — Telehealth: Payer: Self-pay | Admitting: Obstetrics & Gynecology

## 2019-05-16 ENCOUNTER — Ambulatory Visit: Payer: Medicare Other | Admitting: Obstetrics & Gynecology

## 2019-05-16 NOTE — Telephone Encounter (Signed)

## 2019-05-17 ENCOUNTER — Ambulatory Visit: Payer: Medicare Other | Admitting: Obstetrics & Gynecology

## 2019-05-17 ENCOUNTER — Encounter: Payer: Self-pay | Admitting: Obstetrics & Gynecology

## 2019-05-17 ENCOUNTER — Other Ambulatory Visit: Payer: Self-pay

## 2019-05-17 VITALS — BP 152/78 | HR 78 | Ht 64.0 in | Wt 194.0 lb

## 2019-05-17 DIAGNOSIS — N813 Complete uterovaginal prolapse: Secondary | ICD-10-CM

## 2019-05-17 DIAGNOSIS — Z4689 Encounter for fitting and adjustment of other specified devices: Secondary | ICD-10-CM | POA: Diagnosis not present

## 2019-05-17 NOTE — Progress Notes (Signed)
Chief Complaint  Patient presents with  . Pessary Check    Blood pressure (!) 152/78, pulse 78, height 5\' 4"  (1.626 m), weight 194 lb (88 kg).  Barbara Lucero presents today for routine follow up related to her pessary.   She uses a Milex ring with support #5 She reports no vaginal discharge or vaginal bleeding.  Exam reveals no undue vaginal mucosal pressure of breakdown, no discharge and no vaginal bleeding.  The pessary is removed, cleaned and replaced without difficulty.    Barbara Lucero will be sen back in 4 months for continued follow up.  Elias Else, MD  05/17/2019 2:55 PM

## 2019-06-28 DIAGNOSIS — Z0189 Encounter for other specified special examinations: Secondary | ICD-10-CM | POA: Diagnosis not present

## 2019-06-28 DIAGNOSIS — K219 Gastro-esophageal reflux disease without esophagitis: Secondary | ICD-10-CM | POA: Diagnosis not present

## 2019-06-28 DIAGNOSIS — R011 Cardiac murmur, unspecified: Secondary | ICD-10-CM | POA: Diagnosis not present

## 2019-06-28 DIAGNOSIS — Z86718 Personal history of other venous thrombosis and embolism: Secondary | ICD-10-CM | POA: Diagnosis not present

## 2019-06-28 DIAGNOSIS — K5090 Crohn's disease, unspecified, without complications: Secondary | ICD-10-CM | POA: Diagnosis not present

## 2019-06-29 ENCOUNTER — Other Ambulatory Visit (HOSPITAL_COMMUNITY): Payer: Self-pay | Admitting: Internal Medicine

## 2019-06-29 DIAGNOSIS — Z13820 Encounter for screening for osteoporosis: Secondary | ICD-10-CM

## 2019-07-12 ENCOUNTER — Other Ambulatory Visit: Payer: Self-pay

## 2019-07-12 ENCOUNTER — Ambulatory Visit (HOSPITAL_COMMUNITY)
Admission: RE | Admit: 2019-07-12 | Discharge: 2019-07-12 | Disposition: A | Discharge status: Home / Self Care | Payer: Medicare Other | Source: Ambulatory Visit | Attending: Internal Medicine | Admitting: Internal Medicine

## 2019-07-12 DIAGNOSIS — M85851 Other specified disorders of bone density and structure, right thigh: Secondary | ICD-10-CM | POA: Diagnosis not present

## 2019-07-12 DIAGNOSIS — Z13820 Encounter for screening for osteoporosis: Secondary | ICD-10-CM | POA: Insufficient documentation

## 2019-07-12 DIAGNOSIS — Z780 Asymptomatic menopausal state: Secondary | ICD-10-CM | POA: Diagnosis not present

## 2019-07-13 ENCOUNTER — Other Ambulatory Visit (HOSPITAL_COMMUNITY): Payer: Medicare Other

## 2019-07-19 DIAGNOSIS — K5090 Crohn's disease, unspecified, without complications: Secondary | ICD-10-CM | POA: Diagnosis not present

## 2019-07-19 DIAGNOSIS — R7301 Impaired fasting glucose: Secondary | ICD-10-CM | POA: Diagnosis not present

## 2019-07-19 DIAGNOSIS — K219 Gastro-esophageal reflux disease without esophagitis: Secondary | ICD-10-CM | POA: Diagnosis not present

## 2019-07-26 DIAGNOSIS — R011 Cardiac murmur, unspecified: Secondary | ICD-10-CM | POA: Diagnosis not present

## 2019-07-26 DIAGNOSIS — Z86718 Personal history of other venous thrombosis and embolism: Secondary | ICD-10-CM | POA: Diagnosis not present

## 2019-07-26 DIAGNOSIS — K219 Gastro-esophageal reflux disease without esophagitis: Secondary | ICD-10-CM | POA: Diagnosis not present

## 2019-07-26 DIAGNOSIS — K5090 Crohn's disease, unspecified, without complications: Secondary | ICD-10-CM | POA: Diagnosis not present

## 2019-08-01 NOTE — Progress Notes (Signed)
CARDIOLOGY CONSULT NOTE       Patient ID: SEREN CHALOUX MRN: [id number] DOB/AGE: 1940/10/21 79 y.o.  Admit date: (Not on file) Referring Physician: Juanetta Gosling Primary Physician: [name], MD Primary Cardiologist: New Reason for Consultation: Murmur   Active Problems:   * No active hospital problems. *   HPI:  79 y.o. with history of Anicety, depression Crohn disease and arthritis Referred by Dr Juanetta Gosling for irregular pulse and murmur She sees Dr Myra Gianotti for DVT f/u. Unprovoked in left leg June 2019 Appears to have been left on eliquis since that time  However appears that DVT occurred after hip surgery by history Lost her husband this year and depressed Previously on Doxepin Apparently an insurance nurse came to her house and told her she had a heart murmur Quit smoking 20 years ago  She has GI issues with Chohn's on sulfasalazine and reflux on protonix  She has had a hard life. She has a 49 yo son with CP who is bed bound now with supra pubic catheter she cares For by herself. She has a daughter with bi polar disease living with her and 24 yo grand daughter showing signs of mental health disease   She has not had COVID vaccine and we discussed getting it especially with her son at home with CP  ROS All other systems reviewed and negative except as noted above  Past Medical History:  Diagnosis Date  . Anxiety   . Arthritis   . Crohn disease (HCC)   . Depression     Family History  Problem Relation Age of Onset  . Breast cancer Sister   . Breast cancer Sister     Social History   Socioeconomic History  . Marital status: Widowed    Spouse name: Not on file  . Number of children: Not on file  . Years of education: Not on file  . Highest education level: Not on file  Occupational History  . Not on file  Tobacco Use  . Smoking status: Never Smoker  . Smokeless tobacco: Never Used  Vaping Use  . Vaping Use: Never used  Substance and Sexual Activity  . Alcohol use: No   . Drug use: No  . Sexual activity: Not Currently  Other Topics Concern  . Not on file  Social History Narrative  . Not on file   Social Determinants of Health   Financial Resource Strain:   . Difficulty of Paying Living Expenses:   Food Insecurity:   . Worried About Programme researcher, broadcasting/film/video in the Last Year:   . Barista in the Last Year:   Transportation Needs:   . Freight forwarder (Medical):   Marland Kitchen Lack of Transportation (Non-Medical):   Physical Activity:   . Days of Exercise per Week:   . Minutes of Exercise per Session:   Stress:   . Feeling of Stress :   Social Connections:   . Frequency of Communication with Friends and Family:   . Frequency of Social Gatherings with Friends and Family:   . Attends Religious Services:   . Active Member of Clubs or Organizations:   . Attends Banker Meetings:   Marland Kitchen Marital Status:   Intimate Partner Violence:   . Fear of Current or Ex-Partner:   . Emotionally Abused:   Marland Kitchen Physically Abused:   . Sexually Abused:     Past Surgical History:  Procedure Laterality Date  . BOWEL RESECTION    .  HIP ARTHROPLASTY Left 06/06/2016   Procedure: ARTHROPLASTY BIPOLAR HIP (HEMIARTHROPLASTY);  Surgeon: Carole Civil, MD;  Location: [facility];  Service: Orthopedics;  Laterality: Left;  . lt hip replacement        Current Outpatient Medications:  .  acetaminophen (TYLENOL) 650 MG CR tablet, Take 650 mg by mouth every 8 (eight) hours as needed for pain., Disp: , Rfl:  .  Ascorbic Acid (VITAMIN C) 1000 MG tablet, Take 1,000 mg by mouth daily., Disp: , Rfl:  .  b complex vitamins tablet, Take 1 tablet by mouth daily., Disp: , Rfl:  .  CALCIUM PO, Take 1 tablet by mouth daily., Disp: , Rfl:  .  COLLAGEN PO, Take by mouth as directed., Disp: , Rfl:  .  ELIQUIS 5 MG TABS tablet, Take 5 mg by mouth 2 (two) times daily. , Disp: , Rfl:  .  Multiple Vitamin (MULTIVITAMIN) tablet, Take 1 tablet by mouth daily., Disp: , Rfl:  .   mupirocin ointment (BACTROBAN) 2 %, APPLY TO THE AFFECETDNAREA ONCE DAILY.W., Disp: , Rfl:  .  pantoprazole (PROTONIX) 40 MG tablet, Take 40 mg by mouth daily., Disp: , Rfl:  .  Psyllium (METAMUCIL PO), Take by mouth as directed., Disp: , Rfl:  .  sertraline (ZOLOFT) 25 MG tablet, Take 25 mg by mouth as directed., Disp: , Rfl:  .  sulfaSALAzine (AZULFIDINE) 500 MG tablet, Take 500 mg by mouth 2 (two) times daily. , Disp: , Rfl:  .  vitamin E 400 UNIT capsule, Take 400 Units by mouth daily., Disp: , Rfl:     Physical Exam: Blood pressure 114/66, height 5' 2.5" (1.588 m), weight 182 lb 6.4 oz (82.7 kg).   Affect appropriate Walks with walker  Obese white female  HEENT: normal Neck supple with no adenopathy JVP normal bilateral bruits vs transmitted murmur  no thyromegaly Lungs clear with no wheezing and good diaphragmatic motion Heart:  S1/S2 3/6 SEM over aortic area  murmur, no rub, gallop or click PMI normal Abdomen: benighn, BS positve, no tenderness, no AAA no bruit.  No HSM or HJR Distal pulses intact with no bruits Bilateral edema L>R  Neuro non-focal Skin warm and dry No muscular weakness   Labs:   Lab Results  Component Value Date   WBC 11.1 (H) 06/09/2016   HGB 11.0 (L) 06/09/2016   HCT 34.9 (L) 06/09/2016   MCV 92.8 06/09/2016   PLT 153 06/09/2016   No results for input(s): NA, K, CL, CO2, BUN, CREATININE, CALCIUM, PROT, BILITOT, ALKPHOS, ALT, AST, GLUCOSE in the last 168 hours.  Invalid input(s): LABALBU No results found for: CKTOTAL, CKMB, CKMBINDEX, TROPONINI No results found for: CHOL No results found for: HDL No results found for: LDLCALC No results found for: TRIG No results found for: CHOLHDL No results found for: LDLDIRECT    Radiology: DG BONE DENSITY (DXA)  Result Date: 07/12/2019 EXAM: DUAL X-RAY ABSORPTIOMETRY (DXA) FOR BONE MINERAL DENSITY IMPRESSION: Your patient Chundra Sauerwein completed a BMD test on 07/12/2019 using the [place]  (software version: 14.10) manufactured by [employer]. The following summarizes the results of our evaluation. Technologist: [facility] PATIENT BIOGRAPHICAL: Name: Joceline, Hinchcliff Patient ID: [id number] Birth Date: 01-27-1941 Height: 64.0 in. Gender: Female Exam Date: 07/12/2019 Weight: 194.0 lbs. Indications: Caucasian, Crohns disease, Follow up Osteopenia, Height Loss, History of Fracture (Adult), Low Calcium Intake, Post Menopausal, Secondary Osteoporosis Fractures: Hip Treatments: Multivitamin, Vitamin D DENSITOMETRY RESULTS: Site  Region     Measured Date Measured Age WHO Classification Young Adult T-score BMD         %Change vs. Previous Significant Change (*) Right Femur Total 07/12/2019 77.0 Osteopenia -1.4 0.830 g/cm2 -8.9% Yes Right Femur Total 08/23/2013 71.1 Normal -0.8 0.911 g/cm2 - - Left Forearm Radius 33% 07/12/2019 77.0 Normal -0.4 0.687 g/cm2 3.0% - Left Forearm Radius 33% 08/23/2013 71.1 Normal -0.6 0.668 g/cm2 - - ASSESSMENT: The BMD measured at Femur Total is 0.830 g/cm2 with a T-score of -1.4. This patient is considered OSTEOPENIC according to World Health Organization [facility]) criteria. The scan quality is good. Compared with the prior study on 08/23/2013, the BMD of the total rt. hip shows a statistically significant decrease. Lumbar spine was excluded due to advanced degenerative changes and the lt. hip was excluded due to surgical repair. World Science writer [facility]) criteria for post-menopausal, Caucasian Women: Normal:       T-score at or above -1 SD Osteopenia:   T-score between -1 and -2.5 SD Osteoporosis: T-score at or below -2.5 SD RECOMMENDATIONS: 1. All patients should optimize calcium and vitamin D intake. 2. Consider FDA-approved medical therapies in postmenopausal women and med aged 29 years and older, based on the following: a. A hip or vertebral (clinical or morphometric) fracture b. T-score < -2.5 at the femoral neck or spine after appropriate evaluation to exclude  secondary causes c. Low bone mass (T-score between -1.0 and -2.5 at the femoral neck or spine) and a 10-year probability of a hip fracture > 3% or a 10-year probability of a major osteoporosis-related fracture > 20% based on the [place]-adapted WHO algorithm d. Clinician judgment and/or patient preferences may indicate treatment for people with 10-year fracture probabilities above or below these levels FOLLOW-UP: People with diagnosed cases of osteoporosis or at high risk for fracture should have regular bone mineral density tests. For patients eligible for Medicare, routine testing is allowed once every 2 years. The testing frequency can be increased to one year for patients who have rapidly progressing disease, those who are receiving or discontinuing medical therapy to restore bone mass, or have additional risk factors. I have reviewed this report, and agree with the above findings. Mark A. Tyron Russell, M.D. [facility] Your patient KESHAWNA DIX completed a FRAX assessment on 07/12/2019 using the [employer] DXA System (analysis version: 14.10) manufactured by [employer]. The following summarizes the results of our evaluation. PATIENT BIOGRAPHICAL: Name: Donnamaria, Shands Patient ID: [id number] Birth Date: 01-30-1941 Height:    64.0 in. Gender:     Female    [age]       Weight:    194.0 lbs. Ethnicity:  White                            Exam Date: 07/12/2019 FRAX* RESULTS:  (version: 3.5) 10-year Probability of Fracture1 Major Osteoporotic Fracture2 Hip Fracture 14.7% 2.1% Population: [place] (Caucasian) Risk Factors: History of Fracture (Adult), Secondary Osteoporosis Based on Femur (Right) Neck BMD 1 -The 10-year probability of fracture may be lower than reported if the patient has received treatment. 2 -Major Osteoporotic Fracture: Clinical Spine, Forearm, Hip or Shoulder *FRAX is a Armed forces logistics/support/administrative officer of the [employer] of [employer] for Metabolic Bone Disease, a World Environmental consultant (WHO) [employer]. ASSESSMENT: The probability of a major osteoporotic fracture is 14.7% within the next ten years. The probability  of a hip fracture is 2.1% within the next ten years. I have reviewed this report and agree with the above findings. Mark A. Tyron Russell, M.D. [facility] Electronically Signed   By: Ulyses Southward M.D.   On: 07/12/2019 14:57    EKG: NSR normal ECG rate 64   ASSESSMENT AND PLAN:   1. DVT:  History unprovoked per Dr Myra Gianotti left on lifel long eliquis f/u VVS 2  Murmur:  ? AS vs sclerosis f/u echo ordered  3. Burit:  Vs transmitted murmur f/u carotid duplex ordered  4. Crohns:  F/u GI continue azulfidine   F/U in a year if echo not significant   Echo for AS Carotid for burit   Signed: Charlton Haws 08/09/2019, 2:06 PM

## 2019-08-09 ENCOUNTER — Ambulatory Visit: Payer: Medicare Other | Admitting: Cardiovascular Disease

## 2019-08-09 ENCOUNTER — Other Ambulatory Visit: Payer: Self-pay

## 2019-08-09 ENCOUNTER — Encounter: Payer: Self-pay | Admitting: Cardiovascular Disease

## 2019-08-09 VITALS — BP 114/66 | HR 64 | Ht 62.5 in | Wt 182.4 lb

## 2019-08-09 DIAGNOSIS — R0989 Other specified symptoms and signs involving the circulatory and respiratory systems: Secondary | ICD-10-CM

## 2019-08-09 DIAGNOSIS — R011 Cardiac murmur, unspecified: Secondary | ICD-10-CM | POA: Diagnosis not present

## 2019-08-09 NOTE — Patient Instructions (Signed)
Medication Instructions:  Your physician recommends that you continue on your current medications as directed. Please refer to the Current Medication list given to you today.  *If you need a refill on your cardiac medications before your next appointment, please call your pharmacy*   Lab Work: None today  If you have labs (blood work) drawn today and your tests are completely normal, you will receive your results only by: Marland Kitchen MyChart Message (if you have MyChart) OR . A paper copy in the mail If you have any lab test that is abnormal or we need to change your treatment, we will call you to review the results.   Testing/Procedures: Your physician has requested that you have an echocardiogram. Echocardiography is a painless test that uses sound waves to create images of your heart. It provides your doctor with information about the size and shape of your heart and how well your heart's chambers and valves are working. This procedure takes approximately one hour. There are no restrictions for this procedure.    Your physician has requested that you have a carotid duplex. This test is an ultrasound of the carotid arteries in your neck. It looks at blood flow through these arteries that supply the brain with blood. Allow one hour for this exam. There are no restrictions or special instructions.    Follow-Up: At [facility], you and your health needs are our priority.  As part of our continuing mission to provide you with exceptional heart care, we have created designated Provider Care Teams.  These Care Teams include your primary Cardiologist (physician) and Advanced Practice Providers (APPs -  Physician Assistants and Nurse Practitioners) who all work together to provide you with the care you need, when you need it.  We recommend signing up for the patient portal called "MyChart".  Sign up information is provided on this After Visit Summary.  MyChart is used to connect with patients for  Virtual Visits (Telemedicine).  Patients are able to view lab/test results, encounter notes, upcoming appointments, etc.  Non-urgent messages can be sent to your provider as well.     To learn more about what you can do with MyChart, go to [email or web address].    Your next appointment:  We will call you with results. As needed follow up with Dr.Nishan      Thank you for choosing [facility] !

## 2019-08-23 ENCOUNTER — Ambulatory Visit (HOSPITAL_COMMUNITY)
Admission: RE | Admit: 2019-08-23 | Discharge: 2019-08-23 | Disposition: A | Discharge status: Home / Self Care | Payer: Medicare Other | Source: Ambulatory Visit | Attending: Cardiovascular Disease | Admitting: Cardiovascular Disease

## 2019-08-23 ENCOUNTER — Other Ambulatory Visit: Payer: Self-pay

## 2019-08-23 ENCOUNTER — Ambulatory Visit (HOSPITAL_BASED_OUTPATIENT_CLINIC_OR_DEPARTMENT_OTHER)
Admission: RE | Admit: 2019-08-23 | Discharge: 2019-08-23 | Disposition: A | Discharge status: Home / Self Care | Payer: Medicare Other | Source: Ambulatory Visit | Attending: Cardiovascular Disease | Admitting: Cardiovascular Disease

## 2019-08-23 DIAGNOSIS — I6523 Occlusion and stenosis of bilateral carotid arteries: Secondary | ICD-10-CM | POA: Diagnosis not present

## 2019-08-23 DIAGNOSIS — R0989 Other specified symptoms and signs involving the circulatory and respiratory systems: Secondary | ICD-10-CM

## 2019-08-23 DIAGNOSIS — R011 Cardiac murmur, unspecified: Secondary | ICD-10-CM | POA: Diagnosis not present

## 2019-08-23 DIAGNOSIS — R55 Syncope and collapse: Secondary | ICD-10-CM | POA: Diagnosis not present

## 2019-08-23 DIAGNOSIS — I080 Rheumatic disorders of both mitral and aortic valves: Secondary | ICD-10-CM | POA: Insufficient documentation

## 2019-08-23 NOTE — Progress Notes (Signed)
*  PRELIMINARY RESULTS* Echocardiogram 2D Echocardiogram has been performed.  Stacey Drain 08/23/2019, 12:10 PM

## 2019-08-30 DIAGNOSIS — I872 Venous insufficiency (chronic) (peripheral): Secondary | ICD-10-CM | POA: Diagnosis not present

## 2019-08-30 DIAGNOSIS — K5090 Crohn's disease, unspecified, without complications: Secondary | ICD-10-CM | POA: Diagnosis not present

## 2019-08-30 DIAGNOSIS — M545 Low back pain: Secondary | ICD-10-CM | POA: Diagnosis not present

## 2019-08-30 DIAGNOSIS — R011 Cardiac murmur, unspecified: Secondary | ICD-10-CM | POA: Diagnosis not present

## 2019-08-30 DIAGNOSIS — I350 Nonrheumatic aortic (valve) stenosis: Secondary | ICD-10-CM | POA: Diagnosis not present

## 2019-08-31 ENCOUNTER — Telehealth: Payer: Self-pay

## 2019-08-31 DIAGNOSIS — I350 Nonrheumatic aortic (valve) stenosis: Secondary | ICD-10-CM

## 2019-08-31 DIAGNOSIS — I340 Nonrheumatic mitral (valve) insufficiency: Secondary | ICD-10-CM

## 2019-08-31 NOTE — Telephone Encounter (Signed)
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

## 2019-08-31 NOTE — Telephone Encounter (Signed)
Patient aware of results. Per Dr. 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 Patient verbalized understanding.  Placed order for echo in one year.

## 2019-09-06 ENCOUNTER — Ambulatory Visit: Payer: Medicare Other | Admitting: Orthopaedic Surgery

## 2019-09-13 ENCOUNTER — Encounter: Payer: Self-pay | Admitting: Orthopaedic Surgery

## 2019-09-13 ENCOUNTER — Ambulatory Visit: Payer: Medicare Other | Admitting: Obstetrics & Gynecology

## 2019-09-13 ENCOUNTER — Ambulatory Visit: Payer: Medicare Other | Admitting: Orthopaedic Surgery

## 2019-09-13 ENCOUNTER — Other Ambulatory Visit: Payer: Self-pay

## 2019-09-13 ENCOUNTER — Ambulatory Visit: Payer: Medicare Other

## 2019-09-13 VITALS — BP 150/63 | HR 66 | Ht 62.0 in | Wt 182.0 lb

## 2019-09-13 DIAGNOSIS — M79605 Pain in left leg: Secondary | ICD-10-CM

## 2019-09-13 DIAGNOSIS — M25562 Pain in left knee: Secondary | ICD-10-CM

## 2019-09-13 DIAGNOSIS — G8929 Other chronic pain: Secondary | ICD-10-CM

## 2019-09-13 DIAGNOSIS — M545 Low back pain: Secondary | ICD-10-CM

## 2019-09-13 NOTE — Progress Notes (Addendum)
Patient VO:[id number] Barbara Lucero, female DOB:Jan 13, 1941, 79 y.o. IHK:[id number]  Chief Complaint  Patient presents with  . Leg Pain    BIlat let pain getting worse    HPI  Barbara Lucero is a 79 y.o. female who has lower back pain that is getting worse and worse.  She has some radiation to the left knee and lower leg. She has no trauma.  She had bipolar hip by Dr. Romeo Apple in 2019.  She has no numbness but has pain radiating at times.  She has taken Tylenol which helps some.  She has no weakness.  Her left knee is tender at times.   Body mass index is 33.29 kg/m.  ROS  Review of Systems  Constitutional: Positive for activity change.  Musculoskeletal: Positive for arthralgias, back pain, gait problem and joint swelling.  All other systems reviewed and are negative.   All other systems reviewed and are negative.  The following is a summary of the past history medically, past history surgically, known current medicines, social history and family history.  This information is gathered electronically by the computer from prior information and documentation.  I review this each visit and have found including this information at this point in the chart is beneficial and informative.    Past Medical History:  Diagnosis Date  . Anxiety   . Arthritis   . Crohn disease (HCC)   . Depression     Past Surgical History:  Procedure Laterality Date  . BOWEL RESECTION    . HIP ARTHROPLASTY Left 06/06/2016   Procedure: ARTHROPLASTY BIPOLAR HIP (HEMIARTHROPLASTY);  Surgeon: Vickki Hearing, MD;  Location: [facility];  Service: Orthopedics;  Laterality: Left;  . lt hip replacement      Family History  Problem Relation Age of Onset  . Breast cancer Sister   . Breast cancer Sister     Social History Social History   Tobacco Use  . Smoking status: Never Smoker  . Smokeless tobacco: Never Used  Vaping Use  . Vaping Use: Never used  Substance Use Topics  . Alcohol use: No  . Drug use: No     No Known Allergies  Current Outpatient Medications  Medication Sig Dispense Refill  . acetaminophen (TYLENOL) 650 MG CR tablet Take 650 mg by mouth every 8 (eight) hours as needed for pain.    . Ascorbic Acid (VITAMIN C) 1000 MG tablet Take 1,000 mg by mouth daily.    Marland Kitchen Barbara complex vitamins tablet Take 1 tablet by mouth daily.    Marland Kitchen CALCIUM PO Take 1 tablet by mouth daily.    . COLLAGEN PO Take by mouth as directed.    . Multiple Vitamin (MULTIVITAMIN) tablet Take 1 tablet by mouth daily.    . mupirocin ointment (BACTROBAN) 2 % APPLY TO THE AFFECETDNAREA ONCE DAILY.W.    . oxybutynin (DITROPAN) 5 MG tablet Take 5 mg by mouth 2 (two) times daily.    . sertraline (ZOLOFT) 25 MG tablet Take 25 mg by mouth as directed.    . simvastatin (ZOCOR) 10 MG tablet Take 10 mg by mouth at bedtime.    . sulfaSALAzine (AZULFIDINE) 500 MG tablet Take 500 mg by mouth 2 (two) times daily.     . vitamin E 400 UNIT capsule Take 400 Units by mouth daily.    Marland Kitchen ELIQUIS 5 MG TABS tablet Take 5 mg by mouth 2 (two) times daily.  (Patient not taking: Reported on 09/13/2019)    . pantoprazole (PROTONIX)  40 MG tablet Take 40 mg by mouth daily. (Patient not taking: Reported on 09/13/2019)    . Psyllium (METAMUCIL PO) Take by mouth as directed. (Patient not taking: Reported on 09/13/2019)     No current facility-administered medications for this visit.     Physical Exam  Blood pressure (!) 150/63, pulse 66, height 5\' 2"  (1.575 m), weight 182 lb (82.6 kg).  Constitutional: overall normal hygiene, normal nutrition, well developed, normal grooming, normal body habitus. Assistive device:walker  Musculoskeletal: gait and station Limp left, muscle tone and strength are normal, no tremors or atrophy is present.  .  Neurological: coordination overall normal.  Deep tendon reflex/nerve stretch intact.  Sensation normal.  Cranial nerves II-XII intact.   Skin:   Normal overall no scars, lesions, ulcers or rashes. No  psoriasis.  Psychiatric: Alert and oriented x 3.  Recent memory intact, remote memory unclear.  Normal mood and affect. Well groomed.  Good eye contact.  Cardiovascular: overall no swelling, no varicosities, no edema bilaterally, normal temperatures of the legs and arms, no clubbing, cyanosis and good capillary refill.  Lymphatic: palpation is normal.  Spine/Pelvis examination:  Inspection:  Overall, sacoiliac joint benign and hips nontender; without crepitus or defects.   Thoracic spine inspection: Alignment normal without kyphosis present   Lumbar spine inspection: Lumbar scoliosis with apex to left mid lumbar area.   Thoracic spine palpation:  without tenderness of spinal processes   Lumbar spine palpation: there is tenderness of lumbar area; without tightness of lumbar muscles    Range of Motion:   Lumbar flexion, forward flexion is normal without pain or tenderness    Lumbar extension is full without pain or tenderness   Left lateral bend is normal without pain or tenderness   Right lateral bend is normal without pain or tenderness   Straight leg raising is normal  Strength & tone: normal   Stability overall normal stability All other systems reviewed and are negative   The patient has been educated about the nature of the problem(s) and counseled on treatment options.  The patient appeared to understand what I have discussed and is in agreement with it.  Encounter Diagnoses  Name Primary?  . Lumbar pain with radiation down left leg Yes  . Chronic pain of left knee     PLAN Call if any problems.  Precautions discussed.  Continue current medications.   Return to clinic 2 weeks   Get MRI of the lumbar spine.  Electronically Signed , MD 7/20/20212:36 PM  Addendum: X-rays of the lumbar spine were done and reported separately. Electronically Signed 09/15/2019, MD 9/17/20219:26 AM

## 2019-09-20 ENCOUNTER — Encounter: Payer: Self-pay | Admitting: Obstetrics & Gynecology

## 2019-09-20 ENCOUNTER — Ambulatory Visit: Payer: Medicare Other | Admitting: Obstetrics & Gynecology

## 2019-09-20 VITALS — BP 138/73 | HR 84 | Ht 64.0 in | Wt 186.0 lb

## 2019-09-20 DIAGNOSIS — N813 Complete uterovaginal prolapse: Secondary | ICD-10-CM

## 2019-09-20 DIAGNOSIS — Z4689 Encounter for fitting and adjustment of other specified devices: Secondary | ICD-10-CM

## 2019-09-20 NOTE — Progress Notes (Signed)
Chief Complaint  Patient presents with   Pessary Check    Blood pressure (!) 138/73, pulse 84, height 5\' 4"  (1.626 m), weight 186 lb (84.4 kg).  Barbara Lucero presents today for routine follow up related to her pessary.   She uses a Milex ring with support #5 She reports no vaginal discharge or vaginal bleeding.  Exam reveals no undue vaginal mucosal pressure of breakdown, no discharge and no vaginal bleeding.  The pessary is removed, cleaned and replaced without difficulty.       ICD-10-CM   1. 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  Elias Else, MD  09/20/2019 2:38 PM

## 2019-10-04 ENCOUNTER — Ambulatory Visit: Payer: Medicare Other | Admitting: Orthopaedic Surgery

## 2019-10-14 ENCOUNTER — Ambulatory Visit
Admission: RE | Admit: 2019-10-14 | Discharge: 2019-10-14 | Disposition: A | Discharge status: Home / Self Care | Payer: Medicare Other | Source: Ambulatory Visit | Attending: Orthopaedic Surgery | Admitting: Orthopaedic Surgery

## 2019-10-14 DIAGNOSIS — M545 Low back pain, unspecified: Secondary | ICD-10-CM

## 2019-10-14 DIAGNOSIS — M48061 Spinal stenosis, lumbar region without neurogenic claudication: Secondary | ICD-10-CM | POA: Diagnosis not present

## 2019-10-14 DIAGNOSIS — G8929 Other chronic pain: Secondary | ICD-10-CM

## 2019-10-25 ENCOUNTER — Ambulatory Visit (INDEPENDENT_AMBULATORY_CARE_PROVIDER_SITE_OTHER): Payer: Medicare Other | Admitting: Orthopaedic Surgery

## 2019-10-25 ENCOUNTER — Encounter: Payer: Self-pay | Admitting: Orthopaedic Surgery

## 2019-10-25 ENCOUNTER — Other Ambulatory Visit: Payer: Self-pay

## 2019-10-25 VITALS — Ht 64.0 in | Wt 186.0 lb

## 2019-10-25 DIAGNOSIS — M79605 Pain in left leg: Secondary | ICD-10-CM

## 2019-10-25 DIAGNOSIS — M545 Low back pain: Secondary | ICD-10-CM | POA: Diagnosis not present

## 2019-10-25 NOTE — Progress Notes (Signed)
Patient Barbara Lucero, female DOB:May 22, 1940, 79 y.o. ATF:[id number]  Chief Complaint  Patient presents with   Back Pain    HPI  Barbara Lucero is a 79 y.o. female who has pain of the lumbar area.  She has pain to the left leg.  She had MRI which showed:  IMPRESSION: Curvature convex to the left with the apex at L2. Right-sided predominant discogenic endplate edema at Y7-0 and L2-3 which could contribute to back pain.  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 No compressive stenosis of the canal. Mild right lateral recess and foraminal stenosis at L2-3 but without distinct neural compression. Lesser areas of narrowing at the other levels as described above, without likely neural compression. Facet osteoarthritis throughout the region could contribute to back pain. Facet joints at L5-S1 may be chronically fused.  I have gone over the report with her.  I will have her see Dr. Alvester Morin.  I have independently reviewed the MRI.       Body mass index is 31.93 kg/m.  ROS  Review of Systems  Constitutional: Positive for activity change.  Musculoskeletal: Positive for arthralgias, back pain, gait problem and joint swelling.  All other systems reviewed and are negative.   All other systems reviewed and are negative.  The following is a summary of the past history medically, past history surgically, known current medicines, social history and family history.  This information is gathered electronically by the computer from prior information and documentation.  I review this each visit and have found including this information at this point in the chart is beneficial and informative.    Past Medical History:  Diagnosis Date   Anxiety    Arthritis    Crohn disease (HCC)    Depression     Past Surgical History:  Procedure Laterality Date   BOWEL RESECTION     HIP ARTHROPLASTY Left 06/06/2016   Procedure: ARTHROPLASTY BIPOLAR HIP (HEMIARTHROPLASTY);   Surgeon: Vickki Hearing, MD;  Location: [facility];  Service: Orthopedics;  Laterality: Left;   lt hip replacement      Family History  Problem Relation Age of Onset   Breast cancer Sister    Breast cancer Sister     Social History Social History   Tobacco Use   Smoking status: Never Smoker   Smokeless tobacco: Never Used  Building services engineer Use: Never used  Substance Use Topics   Alcohol use: No   Drug use: No    No Known Allergies  Current Outpatient Medications  Medication Sig Dispense Refill   acetaminophen (TYLENOL) 650 MG CR tablet Take 650 mg by mouth every 8 (eight) hours as needed for pain.     Ascorbic Acid (VITAMIN C) 1000 MG tablet Take 1,000 mg by mouth daily.     b complex vitamins tablet Take 1 tablet by mouth daily.     CALCIUM PO Take 1 tablet by mouth daily.     COLLAGEN PO Take by mouth as directed.     Multiple Vitamin (MULTIVITAMIN) tablet Take 1 tablet by mouth daily.     mupirocin ointment (BACTROBAN) 2 % APPLY TO THE AFFECETDNAREA ONCE DAILY.W.     oxybutynin (DITROPAN) 5 MG tablet Take 5 mg by mouth 2 (two) times daily.     pantoprazole (PROTONIX) 40 MG tablet Take 40 mg by mouth daily.      Psyllium (METAMUCIL PO) Take by mouth as directed.      sertraline (ZOLOFT) 25  MG tablet Take 25 mg by mouth as directed.     simvastatin (ZOCOR) 10 MG tablet Take 10 mg by mouth at bedtime.     sulfaSALAzine (AZULFIDINE) 500 MG tablet Take 500 mg by mouth 2 (two) times daily.      vitamin E 400 UNIT capsule Take 400 Units by mouth daily.     ELIQUIS 5 MG TABS tablet Take 5 mg by mouth 2 (two) times daily.  (Patient not taking: Reported on 09/13/2019)     No current facility-administered medications for this visit.     Physical Exam  Height 5\' 4"  (1.626 m), weight 186 lb (84.4 kg).  Constitutional: overall normal hygiene, normal nutrition, well developed, normal grooming, normal body habitus. Assistive  device:walker  Musculoskeletal: gait and station Limp left, muscle tone and strength are normal, no tremors or atrophy is present.  .  Neurological: coordination overall normal.  Deep tendon reflex/nerve stretch intact.  Sensation normal.  Cranial nerves II-XII intact.   Skin:   Normal overall no scars, lesions, ulcers or rashes. No psoriasis.  Psychiatric: Alert and oriented x 3.  Recent memory intact, remote memory unclear.  Normal mood and affect. Well groomed.  Good eye contact.  Cardiovascular: overall no swelling, no varicosities, no edema bilaterally, normal temperatures of the legs and arms, no clubbing, cyanosis and good capillary refill.  Spine/Pelvis examination:  Inspection:  Overall, sacoiliac joint benign and hips nontender; without crepitus or defects.   Thoracic spine inspection: Alignment normal without kyphosis present   Lumbar spine inspection:  Alignment  with normal lumbar lordosis, with scoliosis apparent.   Thoracic spine palpation:  without tenderness of spinal processes   Lumbar spine palpation: without tenderness of lumbar area; without tightness of lumbar muscles    Range of Motion:   Lumbar flexion, forward flexion is normal without pain or tenderness    Lumbar extension is full without pain or tenderness   Left lateral bend is normal without pain or tenderness   Right lateral bend is normal without pain or tenderness   Straight leg raising is normal  Strength & tone: normal   Stability overall normal stability  Lymphatic: palpation is normal.  All other systems reviewed and are negative   The patient has been educated about the nature of the problem(s) and counseled on treatment options.  The patient appeared to understand what I have discussed and is in agreement with it.  Encounter Diagnosis  Name Primary?   Lumbar pain with radiation down left leg Yes    PLAN Call if any problems.  Precautions discussed.  Continue current medications.    Return to clinic 6 weeks   See Dr. for epidural injection possibility.  Electronically Signed Alvester Morin, MD 8/31/20212:30 PM

## 2019-11-14 ENCOUNTER — Encounter: Payer: Self-pay | Admitting: Physical Medicine and Rehabilitation

## 2019-11-14 ENCOUNTER — Other Ambulatory Visit: Payer: Self-pay

## 2019-11-14 ENCOUNTER — Ambulatory Visit: Payer: Self-pay

## 2019-11-14 ENCOUNTER — Ambulatory Visit: Payer: Medicare Other | Admitting: Physical Medicine and Rehabilitation

## 2019-11-14 VITALS — BP 149/77 | HR 73

## 2019-11-14 DIAGNOSIS — M5416 Radiculopathy, lumbar region: Secondary | ICD-10-CM

## 2019-11-14 MED ORDER — METHYLPREDNISOLONE ACETATE 80 MG/ML IJ SUSP
80.0000 mg | Freq: Once | INTRAMUSCULAR | Status: AC
  Administered 2019-11-14: 80 mg

## 2019-11-14 NOTE — Progress Notes (Signed)
Pt state lower back aches and left knee feels like it pops out. Pt state walking for a long period of time makes the pain worse. Pt state hot bath helps sum with pain. Pt state she has to sit down and use heating pad to ease the pain.  Numeric Pain Rating Scale and Functional Assessment Average Pain 8   In the last MONTH (on 0-10 scale) has pain interfered with the following?  1. General activity like being  able to carry out your everyday physical activities such as walking, climbing stairs, carrying groceries, or moving a chair?  Rating(10)   +Driver, -BT, -Dye Allergies.

## 2019-11-20 NOTE — Procedures (Signed)
Lumbar Epidural Steroid Injection - Interlaminar Approach with Fluoroscopic Guidance  Patient: Barbara Lucero      Date of Birth: [birth year] MRN: [id number] PCP: [name], MD      Visit Date: 11/14/2019   Universal Protocol:     Consent Given By: the patient  Position: PRONE  Additional Comments: Vital signs were monitored before and after the procedure. Patient was prepped and draped in the usual sterile fashion. The correct patient, procedure, and site was verified.   Injection Procedure Details:  Procedure Site One Meds Administered:  Meds ordered this encounter  Medications  . methylPREDNISolone acetate (DEPO-MEDROL) injection 80 mg     Laterality: Left  Location/Site:  L5-S1  Needle size: 20 G  Needle type: Tuohy  Needle Placement: Paramedian epidural  Findings:   -Comments: Excellent flow of contrast into the epidural space.  Procedure Details: Using a paramedian approach from the side mentioned above, the region overlying the inferior lamina was localized under fluoroscopic visualization and the soft tissues overlying this structure were infiltrated with 4 ml. of 1% Lidocaine without Epinephrine. The Tuohy needle was inserted into the epidural space using a paramedian approach.   The epidural space was localized using loss of resistance along with lateral and bi-planar fluoroscopic views.  After negative aspirate for air, blood, and CSF, a 2 ml. volume of Isovue-250 was injected into the epidural space and the flow of contrast was observed. Radiographs were obtained for documentation purposes.    The injectate was administered into the level noted above.   Additional Comments:  The patient tolerated the procedure well Dressing: 2 x 2 sterile gauze and Band-Aid    Post-procedure details: Patient was observed during the procedure. Post-procedure instructions were reviewed.  Patient left the clinic in stable condition.

## 2019-11-20 NOTE — Progress Notes (Signed)
Barbara Lucero - 79 y.o. female MRN [id number]  Date of birth: Jul 24, 1940  Office Visit Note: Visit Date: 11/14/2019 PCP: [name], MD Referred by: [name], MD  Subjective: Chief Complaint  Patient presents with  . Lower Back - Pain  . Left Knee - Pain   HPI:  Barbara Lucero is a 79 y.o. female who comes in today at the request of Dr. Darreld Mclean for planned Left L5-S1 Lumbar epidural steroid injection with fluoroscopic guidance.  The patient has failed conservative care including home exercise, medications, time and activity modification.  This injection will be diagnostic and hopefully therapeutic.  Please see requesting physician notes for further details and justification.  MRI reviewed with images and spine model.  MRI reviewed in the note below.  Patient's MRI shows mainly facet arthropathy with some degenerative disc type problems with more edema in the upper lumbar spine.  Depending on relief today would look at L3-4 epidural injection versus facet joint blocks.  I will typically follow the patient to see how she does but would not just generically complete a series of injections unless warranted.   ROS Otherwise per HPI.  Assessment & Plan: Visit Diagnoses:  1. Lumbar radiculopathy     Plan: No additional findings.   Meds & Orders:  Meds ordered this encounter  Medications  . methylPREDNISolone acetate (DEPO-MEDROL) injection 80 mg    Orders Placed This Encounter  Procedures  . XR C-ARM NO REPORT  . Epidural Steroid injection    Follow-up: Return in about 2 weeks (around 11/28/2019), or if symptoms worsen or fail to improve, for possible repeat vs L3-4 interlaminar epidural.   Procedures: No procedures performed  Lumbar Epidural Steroid Injection - Interlaminar Approach with Fluoroscopic Guidance  Patient: Barbara Lucero      Date of Birth: 1940-08-18 MRN: [id number] PCP: [name], MD      Visit Date: 11/14/2019   Universal Protocol:     Consent  Given By: the patient  Position: PRONE  Additional Comments: Vital signs were monitored before and after the procedure. Patient was prepped and draped in the usual sterile fashion. The correct patient, procedure, and site was verified.   Injection Procedure Details:  Procedure Site One Meds Administered:  Meds ordered this encounter  Medications  . methylPREDNISolone acetate (DEPO-MEDROL) injection 80 mg     Laterality: Left  Location/Site:  L5-S1  Needle size: 20 G  Needle type: Tuohy  Needle Placement: Paramedian epidural  Findings:   -Comments: Excellent flow of contrast into the epidural space.  Procedure Details: Using a paramedian approach from the side mentioned above, the region overlying the inferior lamina was localized under fluoroscopic visualization and the soft tissues overlying this structure were infiltrated with 4 ml. of 1% Lidocaine without Epinephrine. The Tuohy needle was inserted into the epidural space using a paramedian approach.   The epidural space was localized using loss of resistance along with lateral and bi-planar fluoroscopic views.  After negative aspirate for air, blood, and CSF, a 2 ml. volume of Isovue-250 was injected into the epidural space and the flow of contrast was observed. Radiographs were obtained for documentation purposes.    The injectate was administered into the level noted above.   Additional Comments:  The patient tolerated the procedure well Dressing: 2 x 2 sterile gauze and Band-Aid    Post-procedure details: Patient was observed during the procedure. Post-procedure instructions were reviewed.  Patient left the clinic in  stable condition.     Clinical History: MRI LUMBAR SPINE WITHOUT CONTRAST  TECHNIQUE: Multiplanar, multisequence MR imaging of the lumbar spine was performed. No intravenous contrast was administered.  COMPARISON:  Radiography 09/13/2019  FINDINGS: Segmentation:  5 lumbar type  vertebral bodies.  Alignment: Curvature convex to the left with the apex at L2. degenerative anterolisthesis at L4-5 and L5-S1 of 2 mm.  Vertebrae: No fracture or primary bone lesion. Discogenic endplate marrow changes on the right at L1-2 and L2-3 with some edema which could be associated with back pain.  Conus medullaris and cauda equina: Conus extends to the L1 level. Conus and cauda equina appear normal.  Paraspinal and other soft tissues: Negative  Disc levels:  Mild, non-compressive disc bulge at L1-2. Discogenic endplate edema on the right as noted above which could be associated with back pain.  L2-3: Bulging of the disc more prominently towards the right. Facet degeneration and hypertrophy on the right. Mild narrowing of the right lateral recess and intervertebral foramen on the right but without visible neural compression. Discogenic endplate edema on the right which could contribute to back pain.  L3-4: Mild bulging of the disc. Bilateral facet osteoarthritis. No compressive stenosis.  L4-5: Mild bulging of the disc. Bilateral facet osteoarthritis with 2 mm of anterolisthesis. No compressive stenosis.  L5-S1: Mild bulging of the disc. 2 mm of anterolisthesis due to facet osteoarthritis. Question if the facet joints are fused at this level. No stenosis.  IMPRESSION: Curvature convex to the left with the apex at L2. Right-sided predominant discogenic endplate edema at O3-5 and L2-3 which could contribute to back pain.  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 No compressive stenosis of the canal. Mild right lateral recess and foraminal stenosis at L2-3 but without distinct neural compression. Lesser areas of narrowing at the other levels as described above, without likely neural compression. Facet osteoarthritis throughout the region could contribute to back pain. Facet joints at L5-S1 may be chronically  fused.   Electronically Signed   By: Paulina Fusi M.D.   On: 10/14/2019 19:55     Objective:  VS:  HT:    WT:   BMI:     BP:(!) 149/77  HR:73bpm  TEMP: ( )  RESP:  Physical Exam Constitutional:      General: She is not in acute distress.    Appearance: Normal appearance. She is not ill-appearing.  HENT:     Head: Normocephalic and atraumatic.     Right Ear: External ear normal.     Left Ear: External ear normal.  Eyes:     Extraocular Movements: Extraocular movements intact.  Cardiovascular:     Rate and Rhythm: Normal rate.     Pulses: Normal pulses.  Musculoskeletal:     Right lower leg: No edema.     Left lower leg: No edema.     Comments: Patient has good distal strength with no pain over the greater trochanters.  No clonus or focal weakness.  Skin:    Findings: No erythema, lesion or rash.  Neurological:     General: No focal deficit present.     Mental Status: She is alert and oriented to person, place, and time.     Sensory: No sensory deficit.     Motor: No weakness or abnormal muscle tone.     Coordination: Coordination normal.  Psychiatric:        Mood and Affect: Mood normal.        Behavior:  Behavior normal.      Imaging: No results found.

## 2019-11-21 DIAGNOSIS — N390 Urinary tract infection, site not specified: Secondary | ICD-10-CM | POA: Diagnosis not present

## 2019-11-22 DIAGNOSIS — K5090 Crohn's disease, unspecified, without complications: Secondary | ICD-10-CM | POA: Diagnosis not present

## 2019-11-22 DIAGNOSIS — Z0189 Encounter for other specified special examinations: Secondary | ICD-10-CM | POA: Diagnosis not present

## 2019-11-22 DIAGNOSIS — I350 Nonrheumatic aortic (valve) stenosis: Secondary | ICD-10-CM | POA: Diagnosis not present

## 2019-11-22 DIAGNOSIS — R7303 Prediabetes: Secondary | ICD-10-CM | POA: Diagnosis not present

## 2019-11-22 DIAGNOSIS — I872 Venous insufficiency (chronic) (peripheral): Secondary | ICD-10-CM | POA: Diagnosis not present

## 2019-12-05 ENCOUNTER — Encounter: Payer: Self-pay | Admitting: Physical Medicine and Rehabilitation

## 2019-12-05 ENCOUNTER — Ambulatory Visit: Payer: Self-pay

## 2019-12-05 ENCOUNTER — Other Ambulatory Visit: Payer: Self-pay

## 2019-12-05 ENCOUNTER — Ambulatory Visit: Payer: Medicare Other | Admitting: Physical Medicine and Rehabilitation

## 2019-12-05 VITALS — BP 141/74 | HR 72

## 2019-12-05 DIAGNOSIS — M5416 Radiculopathy, lumbar region: Secondary | ICD-10-CM

## 2019-12-05 MED ORDER — METHYLPREDNISOLONE ACETATE 80 MG/ML IJ SUSP
80.0000 mg | Freq: Once | INTRAMUSCULAR | Status: AC
  Administered 2019-12-05: 80 mg

## 2019-12-05 NOTE — Progress Notes (Signed)
Barbara Lucero - 79 y.o. female MRN [id number]  Date of birth: 03-03-1940  Office Visit Note: Visit Date: 12/05/2019 PCP: [name], MD Referred by: [name], MD  Subjective: Chief Complaint  Patient presents with  . Lower Back - Pain  . Left Leg - Pain  . Right Leg - Pain   HPI:  Barbara Lucero is a 79 y.o. female who comes in today for planned repeat Left L4-5 Lumbar epidural steroid injection with fluoroscopic guidance.  The patient has failed conservative care including home exercise, medications, time and activity modification.  This injection will be diagnostic and hopefully therapeutic.  Please see requesting physician notes for further details and justification. Patient received more than 50% pain relief from prior injection.  If patient were to have continued axial back pain despite epidural injection would look at diagnostic facet joint blocks.  Referring: Dr. Darreld Mclean  Prior injection did offer more than 50% relief as noted above and did give the patient more functional ability for activities of daily living and was also beneficial in that it did reduce their medication requirement.  They have had physical therapy and continue with home exercises.  Current medication management is not beneficial in increasing their functional status.  Procedures are done as part of a comprehensive orthopedic and pain management program with access to in-house orthopedics, spine surgery and physical therapy as well as access to [facility] biopsychosocial counseling if needed.  She also reports left knee pain.  I have asked her to follow-up with Dr. Hilda Lias concerning her left knee for possible injection.  This could be done in the next couple weeks if he needed to.   ROS Otherwise per HPI.  Assessment & Plan: Visit Diagnoses:  1. Lumbar radiculopathy     Plan: No additional findings.   Meds & Orders:  Meds ordered this encounter  Medications  . methylPREDNISolone  acetate (DEPO-MEDROL) injection 80 mg    Orders Placed This Encounter  Procedures  . XR C-ARM NO REPORT  . Epidural Steroid injection    Follow-up: Return if symptoms worsen or fail to improve.   Procedures: No procedures performed  Lumbar Epidural Steroid Injection - Interlaminar Approach with Fluoroscopic Guidance  Patient: Barbara Lucero      Date of Birth: 02/20/1941 MRN: [id number] PCP: [name], MD      Visit Date: 12/05/2019   Universal Protocol:     Consent Given By: the patient  Position: PRONE  Additional Comments: Vital signs were monitored before and after the procedure. Patient was prepped and draped in the usual sterile fashion. The correct patient, procedure, and site was verified.   Injection Procedure Details:   Procedure diagnoses:  1. Lumbar radiculopathy      Meds Administered:  Meds ordered this encounter  Medications  . methylPREDNISolone acetate (DEPO-MEDROL) injection 80 mg     Laterality: Left  Location/Site:  L4-L5  Needle size: 20 G  Needle type: Tuohy  Needle Placement: Paramedian epidural  Findings:   -Comments: Excellent flow of contrast into the epidural space.  Procedure Details: Using a paramedian approach from the side mentioned above, the region overlying the inferior lamina was localized under fluoroscopic visualization and the soft tissues overlying this structure were infiltrated with 4 ml. of 1% Lidocaine without Epinephrine. The Tuohy needle was inserted into the epidural space using a paramedian approach.   The epidural space was localized using loss of resistance along with counter  oblique bi-planar fluoroscopic views.  After negative aspirate for air, blood, and CSF, a 2 ml. volume of Isovue-250 was injected into the epidural space and the flow of contrast was observed. Radiographs were obtained for documentation purposes.    The injectate was administered into the level noted above.   Additional Comments:   The patient tolerated the procedure well Dressing: 2 x 2 sterile gauze and Band-Aid    Post-procedure details: Patient was observed during the procedure. Post-procedure instructions were reviewed.  Patient left the clinic in stable condition.    Clinical History: MRI LUMBAR SPINE WITHOUT CONTRAST  TECHNIQUE: Multiplanar, multisequence MR imaging of the lumbar spine was performed. No intravenous contrast was administered.  COMPARISON:  Radiography 09/13/2019  FINDINGS: Segmentation:  5 lumbar type vertebral bodies.  Alignment: Curvature convex to the left with the apex at L2. degenerative anterolisthesis at L4-5 and L5-S1 of 2 mm.  Vertebrae: No fracture or primary bone lesion. Discogenic endplate marrow changes on the right at L1-2 and L2-3 with some edema which could be associated with back pain.  Conus medullaris and cauda equina: Conus extends to the L1 level. Conus and cauda equina appear normal.  Paraspinal and other soft tissues: Negative  Disc levels:  Mild, non-compressive disc bulge at L1-2. Discogenic endplate edema on the right as noted above which could be associated with back pain.  L2-3: Bulging of the disc more prominently towards the right. Facet degeneration and hypertrophy on the right. Mild narrowing of the right lateral recess and intervertebral foramen on the right but without visible neural compression. Discogenic endplate edema on the right which could contribute to back pain.  L3-4: Mild bulging of the disc. Bilateral facet osteoarthritis. No compressive stenosis.  L4-5: Mild bulging of the disc. Bilateral facet osteoarthritis with 2 mm of anterolisthesis. No compressive stenosis.  L5-S1: Mild bulging of the disc. 2 mm of anterolisthesis due to facet osteoarthritis. Question if the facet joints are fused at this level. No stenosis.  IMPRESSION: Curvature convex to the left with the apex at L2. Right-sided predominant  discogenic endplate edema at P5-3 and L2-3 which could contribute to back pain.  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 No compressive stenosis of the canal. Mild right lateral recess and foraminal stenosis at L2-3 but without distinct neural compression. Lesser areas of narrowing at the other levels as described above, without likely neural compression. Facet osteoarthritis throughout the region could contribute to back pain. Facet joints at L5-S1 may be chronically fused.   Electronically Signed   By: Paulina Fusi M.D.   On: 10/14/2019 19:55     Objective:  VS:  HT:    WT:   BMI:     BP:(!) 141/74  HR:72bpm  TEMP: ( )  RESP:  Physical Exam Constitutional:      General: She is not in acute distress.    Appearance: Normal appearance. She is not ill-appearing.  HENT:     Head: Normocephalic and atraumatic.     Right Ear: External ear normal.     Left Ear: External ear normal.  Eyes:     Extraocular Movements: Extraocular movements intact.  Cardiovascular:     Rate and Rhythm: Normal rate.     Pulses: Normal pulses.  Musculoskeletal:     Right lower leg: No edema.     Left lower leg: No edema.     Comments: Patient has good distal strength with no pain over the greater trochanters.  No clonus  or focal weakness.Patient somewhat slow to rise from a seated position to full extension.  There is concordant low back pain with facet loading and lumbar spine extension rotation.  There are no definitive trigger points but the patient is somewhat tender across the lower back and PSIS.  There is no pain with hip rotation.   Skin:    Findings: No erythema, lesion or rash.  Neurological:     General: No focal deficit present.     Mental Status: She is alert and oriented to person, place, and time.     Sensory: No sensory deficit.     Motor: No weakness or abnormal muscle tone.     Coordination: Coordination normal.     Gait: Gait abnormal.  Psychiatric:         Mood and Affect: Mood normal.        Behavior: Behavior normal.      Imaging: No results found.

## 2019-12-05 NOTE — Procedures (Signed)
Lumbar Epidural Steroid Injection - Interlaminar Approach with Fluoroscopic Guidance  Patient: Barbara Lucero      Date of Birth: Sep 09, 1940 MRN: [id number] PCP: [name], MD      Visit Date: 12/05/2019   Universal Protocol:     Consent Given By: the patient  Position: PRONE  Additional Comments: Vital signs were monitored before and after the procedure. Patient was prepped and draped in the usual sterile fashion. The correct patient, procedure, and site was verified.   Injection Procedure Details:   Procedure diagnoses:  1. Lumbar radiculopathy      Meds Administered:  Meds ordered this encounter  Medications  . methylPREDNISolone acetate (DEPO-MEDROL) injection 80 mg     Laterality: Left  Location/Site:  L4-L5  Needle size: 20 G  Needle type: Tuohy  Needle Placement: Paramedian epidural  Findings:   -Comments: Excellent flow of contrast into the epidural space.  Procedure Details: Using a paramedian approach from the side mentioned above, the region overlying the inferior lamina was localized under fluoroscopic visualization and the soft tissues overlying this structure were infiltrated with 4 ml. of 1% Lidocaine without Epinephrine. The Tuohy needle was inserted into the epidural space using a paramedian approach.   The epidural space was localized using loss of resistance along with counter oblique bi-planar fluoroscopic views.  After negative aspirate for air, blood, and CSF, a 2 ml. volume of Isovue-250 was injected into the epidural space and the flow of contrast was observed. Radiographs were obtained for documentation purposes.    The injectate was administered into the level noted above.   Additional Comments:  The patient tolerated the procedure well Dressing: 2 x 2 sterile gauze and Band-Aid    Post-procedure details: Patient was observed during the procedure. Post-procedure instructions were reviewed.  Patient left the clinic in stable  condition.

## 2019-12-05 NOTE — Progress Notes (Signed)
Pt state Lower back pain. Pt state walking and standing makes her pain worse. Pt state pain meds and heating pads helps ease the pain. Pt has hx of inj on 11/14/19 Pt state it made the pain better but lately the pain has been coming and going.  Numeric Pain Rating Scale and Functional Assessment Average Pain 4   In the last MONTH (on 0-10 scale) has pain interfered with the following?  1. General activity like being  able to carry out your everyday physical activities such as walking, climbing stairs, carrying groceries, or moving a chair?  Rating(4)   +Driver, -BT, -Dye Allergies.

## 2019-12-06 ENCOUNTER — Ambulatory Visit: Payer: Medicare Other | Admitting: Orthopaedic Surgery

## 2019-12-06 DIAGNOSIS — Z0189 Encounter for other specified special examinations: Secondary | ICD-10-CM | POA: Diagnosis not present

## 2019-12-06 DIAGNOSIS — R7303 Prediabetes: Secondary | ICD-10-CM | POA: Diagnosis not present

## 2019-12-06 DIAGNOSIS — E559 Vitamin D deficiency, unspecified: Secondary | ICD-10-CM | POA: Diagnosis not present

## 2019-12-06 DIAGNOSIS — I350 Nonrheumatic aortic (valve) stenosis: Secondary | ICD-10-CM | POA: Diagnosis not present

## 2019-12-06 DIAGNOSIS — E1165 Type 2 diabetes mellitus with hyperglycemia: Secondary | ICD-10-CM | POA: Diagnosis not present

## 2019-12-06 DIAGNOSIS — K219 Gastro-esophageal reflux disease without esophagitis: Secondary | ICD-10-CM | POA: Diagnosis not present

## 2019-12-06 DIAGNOSIS — I872 Venous insufficiency (chronic) (peripheral): Secondary | ICD-10-CM | POA: Diagnosis not present

## 2019-12-06 DIAGNOSIS — K5090 Crohn's disease, unspecified, without complications: Secondary | ICD-10-CM | POA: Diagnosis not present

## 2019-12-13 DIAGNOSIS — K5090 Crohn's disease, unspecified, without complications: Secondary | ICD-10-CM | POA: Diagnosis not present

## 2019-12-13 DIAGNOSIS — K219 Gastro-esophageal reflux disease without esophagitis: Secondary | ICD-10-CM | POA: Diagnosis not present

## 2019-12-13 DIAGNOSIS — Z86718 Personal history of other venous thrombosis and embolism: Secondary | ICD-10-CM | POA: Diagnosis not present

## 2019-12-27 ENCOUNTER — Encounter: Payer: Self-pay | Admitting: Orthopaedic Surgery

## 2019-12-27 ENCOUNTER — Ambulatory Visit: Payer: Medicare Other | Admitting: Orthopaedic Surgery

## 2019-12-27 ENCOUNTER — Other Ambulatory Visit: Payer: Self-pay

## 2019-12-27 ENCOUNTER — Ambulatory Visit: Payer: Medicare Other | Admitting: Obstetrics & Gynecology

## 2019-12-27 ENCOUNTER — Encounter: Payer: Self-pay | Admitting: Obstetrics & Gynecology

## 2019-12-27 VITALS — BP 124/61 | HR 69 | Ht 64.0 in | Wt 190.0 lb

## 2019-12-27 VITALS — BP 154/73 | HR 73 | Ht 64.0 in | Wt 186.0 lb

## 2019-12-27 DIAGNOSIS — M25562 Pain in left knee: Secondary | ICD-10-CM | POA: Diagnosis not present

## 2019-12-27 DIAGNOSIS — M25561 Pain in right knee: Secondary | ICD-10-CM | POA: Diagnosis not present

## 2019-12-27 DIAGNOSIS — G8929 Other chronic pain: Secondary | ICD-10-CM | POA: Diagnosis not present

## 2019-12-27 DIAGNOSIS — Z4689 Encounter for fitting and adjustment of other specified devices: Secondary | ICD-10-CM

## 2019-12-27 DIAGNOSIS — N813 Complete uterovaginal prolapse: Secondary | ICD-10-CM

## 2019-12-27 MED ORDER — SOLIFENACIN SUCCINATE 10 MG PO TABS: 10.0000 mg | ORAL_TABLET | Freq: Every day | ORAL | 11 refills | Status: DC

## 2019-12-27 NOTE — Progress Notes (Signed)
Chief Complaint  Patient presents with  . not able to hold urine since getting pessary    Blood pressure 124/61, pulse 69, height 5\' 4"  (1.626 m), weight 190 lb (86.2 kg).  Barbara Lucero presents today for routine follow up related to her pessary.   She uses a Milex ring with support #5, placed 03/2015 She reports no vaginal discharge or vaginal bleeding.  Exam reveals no undue vaginal mucosal pressure of breakdown, no discharge and no vaginal bleeding.  The pessary is removed, cleaned and replaced without difficulty.    [name] will be sen back in 4 months for continued follow up.    ICD-10-CM   1. 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 solifenacin (VESICARE) 10 MG tablet    Sig: Take 1 tablet (10 mg total) by mouth daily. At bedtime    Dispense:  30 tablet    Refill:  11     04/2015, MD  12/27/2019 3:02 PM

## 2019-12-27 NOTE — Progress Notes (Signed)
Barbara Lucero, female DOB:02-17-1941, 79 y.o. EPP:[id number]  Chief Complaint  Barbara presents with  . Back Pain    injection only helped one week  . Knee Pain    both wants injections     HPI  Barbara Lucero is a 79 y.o. female who has lower back pain.  The epidural helped about a week.  She will talk to Dr. Alvester Morin about this.  Her knees are hurting and swelling and popping.  She has no trauma, no redness, no giving way. She uses a walker because they hurt so much.  She has more pain in the left knee.  She has problems with chronic cellulitis of the lower legs as well.   Body mass index is 31.93 kg/m.  ROS  Review of Systems  Constitutional: Positive for activity change.  Musculoskeletal: Positive for arthralgias, back pain, gait problem and joint swelling.  All other systems reviewed and are negative.   All other systems reviewed and are negative.  The following is a summary of the past history medically, past history surgically, known current medicines, social history and family history.  This information is gathered electronically by the computer from prior information and documentation.  I review this each visit and have found including this information at this point in the chart is beneficial and informative.    Past Medical History:  Diagnosis Date  . Anxiety   . Arthritis   . Crohn disease (HCC)   . Depression     Past Surgical History:  Procedure Laterality Date  . BOWEL RESECTION    . HIP ARTHROPLASTY Left 06/06/2016   Procedure: ARTHROPLASTY BIPOLAR HIP (HEMIARTHROPLASTY);  Surgeon: Vickki Hearing, MD;  Location: [facility];  Service: Orthopedics;  Laterality: Left;  . lt hip replacement      Family History  Problem Relation Age of Onset  . Breast cancer Sister   . Breast cancer Sister     Social History Social History   Tobacco Use  . Smoking status: Never Smoker  . Smokeless tobacco: Never Used  Vaping Use  . Vaping Use: Never used   Substance Use Topics  . Alcohol use: No  . Drug use: No    No Known Allergies  Current Outpatient Medications  Medication Sig Dispense Refill  . acetaminophen (TYLENOL) 650 MG CR tablet Take 650 mg by mouth every 8 (eight) hours as needed for pain.    . Ascorbic Acid (VITAMIN C) 1000 MG tablet Take 1,000 mg by mouth daily.    Marland Kitchen b complex vitamins tablet Take 1 tablet by mouth daily.    Marland Kitchen CALCIUM PO Take 1 tablet by mouth daily.    . COLLAGEN PO Take by mouth as directed.    . Multiple Vitamin (MULTIVITAMIN) tablet Take 1 tablet by mouth daily.    . mupirocin ointment (BACTROBAN) 2 % APPLY TO THE AFFECETDNAREA ONCE DAILY.W.    . oxybutynin (DITROPAN) 5 MG tablet Take 5 mg by mouth 2 (two) times daily.    . pantoprazole (PROTONIX) 40 MG tablet Take 40 mg by mouth daily.     . Psyllium (METAMUCIL PO) Take by mouth as directed.     . sertraline (ZOLOFT) 25 MG tablet Take 25 mg by mouth as directed.    . simvastatin (ZOCOR) 10 MG tablet Take 10 mg by mouth at bedtime.    . sulfaSALAzine (AZULFIDINE) 500 MG tablet Take 500 mg by mouth 2 (two) times daily.     Marland Kitchen  vitamin E 400 UNIT capsule Take 400 Units by mouth daily.    Marland Kitchen ELIQUIS 5 MG TABS tablet Take 5 mg by mouth 2 (two) times daily.  (Barbara not taking: Reported on 12/27/2019)     No current facility-administered medications for this visit.     Physical Exam  Blood pressure (!) 154/73, pulse 73, height 5\' 4"  (1.626 m), weight 186 lb (84.4 kg).  Constitutional: overall normal hygiene, normal nutrition, well developed, normal grooming, normal body habitus. Assistive device:walker  Musculoskeletal: gait and station Limp left, muscle tone and strength are normal, no tremors or atrophy is present.  .  Neurological: coordination overall normal.  Deep tendon reflex/nerve stretch intact.  Sensation normal.  Cranial nerves II-XII intact.   Skin:   Normal overall no scars, lesions, ulcers or rashes. No psoriasis.  Psychiatric: Alert  and oriented x 3.  Recent memory intact, remote memory unclear.  Normal mood and affect. Well groomed.  Good eye contact.  Cardiovascular: overall no swelling, no varicosities, no edema bilaterally, normal temperatures of the legs and arms, no clubbing, cyanosis and good capillary refill.  Lymphatic: palpation is normal.  Both knees are tender the left has effusion, both have crepitus, ROM left 0 to 105, right 0 to 110.  NV intact.  Limp left.  No redness.  All other systems reviewed and are negative   The Barbara has been educated about the nature of the problem(s) and counseled on treatment options.  The Barbara appeared to understand what I have discussed and is in agreement with it.  Encounter Diagnoses  Name Primary?  . Chronic pain of left knee Yes  . Chronic pain of right knee    PROCEDURE NOTE:  The Barbara requests injections of the left knee , verbal consent was obtained.  The left knee was prepped appropriately after time out was performed.   Sterile technique was observed and injection of 1 cc of Depo-Medrol 40 mg with several cc's of plain xylocaine. Anesthesia was provided by ethyl chloride and a 20-gauge needle was used to inject the knee area. The injection was tolerated well.  A band aid dressing was applied.  The Barbara was advised to apply ice later today and tomorrow to the injection sight as needed.  PROCEDURE NOTE:  The Barbara requests injections of the right knee , verbal consent was obtained.  The right knee was prepped appropriately after time out was performed.   Sterile technique was observed and injection of 1 cc of Depo-Medrol 40 mg with several cc's of plain xylocaine. Anesthesia was provided by ethyl chloride and a 20-gauge needle was used to inject the knee area. The injection was tolerated well.  A band aid dressing was applied.  The Barbara was advised to apply ice later today and tomorrow to the injection sight as needed.   PLAN Call if any  problems.  Precautions discussed.  Continue current medications.   Return to clinic 2 months   Electronically Signed , MD 11/2/20211:53 PM

## 2020-01-24 ENCOUNTER — Ambulatory Visit: Payer: Medicare Other | Admitting: Obstetrics & Gynecology

## 2020-02-28 ENCOUNTER — Ambulatory Visit: Payer: Medicare Other | Admitting: Orthopaedic Surgery

## 2020-03-05 DIAGNOSIS — I872 Venous insufficiency (chronic) (peripheral): Secondary | ICD-10-CM | POA: Diagnosis not present

## 2020-03-05 DIAGNOSIS — R600 Localized edema: Secondary | ICD-10-CM | POA: Diagnosis not present

## 2020-03-05 DIAGNOSIS — I87313 Chronic venous hypertension (idiopathic) with ulcer of bilateral lower extremity: Secondary | ICD-10-CM | POA: Diagnosis not present

## 2020-03-27 DIAGNOSIS — K5090 Crohn's disease, unspecified, without complications: Secondary | ICD-10-CM | POA: Diagnosis not present

## 2020-03-27 DIAGNOSIS — R7303 Prediabetes: Secondary | ICD-10-CM | POA: Diagnosis not present

## 2020-03-27 DIAGNOSIS — I872 Venous insufficiency (chronic) (peripheral): Secondary | ICD-10-CM | POA: Diagnosis not present

## 2020-03-27 DIAGNOSIS — I87313 Chronic venous hypertension (idiopathic) with ulcer of bilateral lower extremity: Secondary | ICD-10-CM | POA: Diagnosis not present

## 2020-03-27 DIAGNOSIS — Z0189 Encounter for other specified special examinations: Secondary | ICD-10-CM | POA: Diagnosis not present

## 2020-03-27 DIAGNOSIS — R600 Localized edema: Secondary | ICD-10-CM | POA: Diagnosis not present

## 2020-03-27 DIAGNOSIS — I350 Nonrheumatic aortic (valve) stenosis: Secondary | ICD-10-CM | POA: Diagnosis not present

## 2020-04-03 ENCOUNTER — Encounter (HOSPITAL_COMMUNITY): Payer: Self-pay | Admitting: Emergency Medicine

## 2020-04-03 ENCOUNTER — Other Ambulatory Visit: Payer: Self-pay

## 2020-04-03 ENCOUNTER — Emergency Department (HOSPITAL_COMMUNITY)
Admission: EM | Admit: 2020-04-03 | Discharge: 2020-04-03 | Disposition: A | Discharge status: Home / Self Care | Payer: Medicare Other | Attending: Emergency Medicine | Admitting: Emergency Medicine

## 2020-04-03 DIAGNOSIS — I83018 Varicose veins of right lower extremity with ulcer other part of lower leg: Secondary | ICD-10-CM | POA: Insufficient documentation

## 2020-04-03 DIAGNOSIS — Z79899 Other long term (current) drug therapy: Secondary | ICD-10-CM | POA: Diagnosis not present

## 2020-04-03 DIAGNOSIS — L97919 Non-pressure chronic ulcer of unspecified part of right lower leg with unspecified severity: Secondary | ICD-10-CM | POA: Insufficient documentation

## 2020-04-03 DIAGNOSIS — I83891 Varicose veins of right lower extremities with other complications: Secondary | ICD-10-CM | POA: Diagnosis not present

## 2020-04-03 DIAGNOSIS — Z96642 Presence of left artificial hip joint: Secondary | ICD-10-CM | POA: Diagnosis not present

## 2020-04-03 DIAGNOSIS — R030 Elevated blood-pressure reading, without diagnosis of hypertension: Secondary | ICD-10-CM

## 2020-04-03 DIAGNOSIS — R233 Spontaneous ecchymoses: Secondary | ICD-10-CM | POA: Diagnosis present

## 2020-04-03 HISTORY — DX: Varicose veins of bilateral lower extremities with other complications: I83.893

## 2020-04-03 MED ORDER — LIDOCAINE-EPINEPHRINE (PF) 1 %-1:200000 IJ SOLN
10.0000 mL | Freq: Once | INTRAMUSCULAR | Status: AC
  Administered 2020-04-03: 10 mL
  Filled 2020-04-03: qty 30

## 2020-04-03 NOTE — ED Triage Notes (Signed)
Pt states she was washing dishes when she noticed her foot was "squirting blood". States she looked down and "there was a puddle of blood on the floor and my shoe was full of it". Pt denies any injury. Pt called EMS and was treated on scene with a pressure dsg and encouraged to come with them to ED but pt declined their offer for a ride and had a family member bring her here instead. Pt has bandage in place that was applied by EMS.

## 2020-04-03 NOTE — ED Provider Notes (Addendum)
[facility] EMERGENCY DEPARTMENT Provider Note   CSN: [id number] Arrival date & time: 04/03/20  1194   History Chief complaint: Bleeding  Barbara Lucero is a 80 y.o. female.  The history is provided by the patient.  She has history of Crohn's disease, anxiety and depression, varicose veins and comes in because of spontaneous bleeding on her right foot.  She was doing dishes when she noted that her shoe was filled with blood.  She denies any trauma.  She has applied pressure on it, but whenever pressure is relieved, bleeding restarts.  She is not taking any anticoagulants.  Past Medical History:  Diagnosis Date  . Anxiety   . Arthritis   . Crohn disease (HCC)   . Depression   . Varicose veins of bilateral lower extremities with other complications     Patient Active Problem List   Diagnosis Date Noted  . 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 Anxiety state 06/08/2016  . Depression 06/08/2016  . Crohn's disease (HCC) 06/08/2016  . History of fracture of left hip 06/05/2016  . 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 Foot fracture 12/15/2013  . Metatarsal fracture 11/05/2013    Past Surgical History:  Procedure Laterality Date  . BOWEL RESECTION    . HIP ARTHROPLASTY Left 06/06/2016   Procedure: ARTHROPLASTY BIPOLAR HIP (HEMIARTHROPLASTY);  Surgeon: Vickki Hearing, MD;  Location: [facility];  Service: Orthopedics;  Laterality: Left;  . lt hip replacement       OB History    Gravida      Para      Term      Preterm      AB      Living  2     SAB      IAB      Ectopic      Multiple      Live Births              Family History  Problem Relation Age of Onset  . Breast cancer Sister   . Breast cancer Sister   . Other Son        disabled  . Bipolar disorder Daughter     Social History   Tobacco Use  . Smoking status: Never Smoker  . Smokeless tobacco: Never Used  Vaping Use  . Vaping Use: Never used  Substance Use  Topics  . Alcohol use: No  . Drug use: No    Home Medications Prior to Admission medications   Medication Sig Start Date End Date Taking? Authorizing Provider  acetaminophen (TYLENOL) 650 MG CR tablet Take 650 mg by mouth every 8 (eight) hours as needed for pain.    [provider]  Ascorbic Acid (VITAMIN C) 1000 MG tablet Take 1,000 mg by mouth.     [provider]  b complex vitamins tablet Take 1 tablet by mouth daily.    [provider]  CALCIUM PO Take 1 tablet by mouth daily.    [provider]  COLLAGEN PO Take by mouth as directed.    [provider]  Multiple Vitamin (MULTIVITAMIN) tablet Take 1 tablet by mouth daily.    [provider]  pantoprazole (PROTONIX) 40 MG tablet Take 40 mg by mouth daily.  07/27/19   [provider]  Psyllium (METAMUCIL PO) Take by mouth as directed.     [provider]  sertraline (ZOLOFT) 25 MG tablet Take 25 mg by mouth as  directed. 07/26/19   [provider]  simvastatin (ZOCOR) 10 MG tablet Take 10 mg by mouth at bedtime. 08/30/19   [provider]  solifenacin (VESICARE) 10 MG tablet Take 1 tablet (10 mg total) by mouth daily. At bedtime 12/27/19   Lazaro Arms, MD  sulfaSALAzine (AZULFIDINE) 500 MG tablet Take 500 mg by mouth 2 (two) times daily.     [provider]  vitamin E 400 UNIT capsule Take 400 Units by mouth daily.    [provider]    Allergies    Patient has no known allergies.  Review of Systems   Review of Systems  All other systems reviewed and are negative.   Physical Exam Updated Vital Signs BP (!) 160/76 (BP Location: Left Arm)   Pulse 77   Temp 98.7 F (37.1 C) (Oral)   Resp 18   Ht 5\' 4"  (1.626 m)   Wt 86.6 kg   SpO2 94%   BMI 32.79 kg/m   Physical Exam Vitals and nursing note reviewed.   80 year old female, resting comfortably and in no acute distress. Vital signs are significant for elevated blood  pressure. Oxygen saturation is 94%, which is normal. Head is normocephalic and atraumatic. PERRLA, EOMI. Oropharynx is clear. Neck is nontender and supple without adenopathy or JVD. Back is nontender and there is no CVA tenderness. Lungs are clear without rales, wheezes, or rhonchi. Chest is nontender. Heart has regular rate and rhythm without murmur. Abdomen is soft, flat, nontender without masses or hepatosplenomegaly and peristalsis is normoactive. Extremities: Pressure dressing had been applied to her right foot.  This is removed and there is no active bleeding, but bleeding site was identified on a small varicosity. Skin is warm and dry without rash. Neurologic: Mental status is normal, cranial nerves are intact, there are no motor or sensory deficits.  ED Results / Procedures / Treatments    Procedures Procedures  Under local anesthesia with 1% lidocaine with epinephrine a figure-of-eight suture was placed around the bleeding site.  Suture was placed with 4-0 Prolene.  Following this, there was good hemostasis.  Patient tolerated procedure well.  Medications Ordered in ED Medications  lidocaine-EPINEPHrine (XYLOCAINE-EPINEPHrine) 1 %-1:200000 (PF) injection 10 mL (has no administration in time range)    ED Course  I have reviewed the triage vital signs and the nursing notes.  Pertinent labs & imaging results that were available during my care of the patient were reviewed by me and considered in my medical decision making (see chart for details).  MDM Rules/Calculators/A&P Bleeding varicose vein of the right foot.  This is treated with a figure-of-eight suture, done at 0440.  Old records are reviewed, and she has no relevant past visits.  Final Clinical Impression(s) / ED Diagnoses Final diagnoses:  None    Rx / DC Orders ED Discharge Orders    None       62, MD 04/03/20 06/01/20    1610, MD 04/19/20 [phone]

## 2020-04-03 NOTE — Discharge Instructions (Signed)
See your primary care provider in 2-3 days to have the stitch removed.  Return if you are having any problems.

## 2020-04-07 DIAGNOSIS — Z4802 Encounter for removal of sutures: Secondary | ICD-10-CM | POA: Diagnosis not present

## 2020-04-28 DIAGNOSIS — R600 Localized edema: Secondary | ICD-10-CM | POA: Diagnosis not present

## 2020-04-28 DIAGNOSIS — I872 Venous insufficiency (chronic) (peripheral): Secondary | ICD-10-CM | POA: Diagnosis not present

## 2020-04-28 DIAGNOSIS — L03115 Cellulitis of right lower limb: Secondary | ICD-10-CM | POA: Diagnosis not present

## 2020-04-28 DIAGNOSIS — L03116 Cellulitis of left lower limb: Secondary | ICD-10-CM | POA: Diagnosis not present

## 2020-04-28 DIAGNOSIS — I87313 Chronic venous hypertension (idiopathic) with ulcer of bilateral lower extremity: Secondary | ICD-10-CM | POA: Diagnosis not present

## 2020-05-03 DIAGNOSIS — K5090 Crohn's disease, unspecified, without complications: Secondary | ICD-10-CM | POA: Diagnosis not present

## 2020-05-03 DIAGNOSIS — I872 Venous insufficiency (chronic) (peripheral): Secondary | ICD-10-CM | POA: Diagnosis not present

## 2020-05-03 DIAGNOSIS — R7303 Prediabetes: Secondary | ICD-10-CM | POA: Diagnosis not present

## 2020-05-03 DIAGNOSIS — I350 Nonrheumatic aortic (valve) stenosis: Secondary | ICD-10-CM | POA: Diagnosis not present

## 2020-05-03 DIAGNOSIS — Z0189 Encounter for other specified special examinations: Secondary | ICD-10-CM | POA: Diagnosis not present

## 2020-05-08 DIAGNOSIS — K219 Gastro-esophageal reflux disease without esophagitis: Secondary | ICD-10-CM | POA: Diagnosis not present

## 2020-05-08 DIAGNOSIS — M5416 Radiculopathy, lumbar region: Secondary | ICD-10-CM | POA: Diagnosis not present

## 2020-05-08 DIAGNOSIS — K5090 Crohn's disease, unspecified, without complications: Secondary | ICD-10-CM | POA: Diagnosis not present

## 2020-05-08 DIAGNOSIS — Z86718 Personal history of other venous thrombosis and embolism: Secondary | ICD-10-CM | POA: Diagnosis not present

## 2020-05-08 DIAGNOSIS — D72829 Elevated white blood cell count, unspecified: Secondary | ICD-10-CM | POA: Diagnosis not present

## 2020-05-08 DIAGNOSIS — R7303 Prediabetes: Secondary | ICD-10-CM | POA: Diagnosis not present

## 2020-05-08 DIAGNOSIS — I350 Nonrheumatic aortic (valve) stenosis: Secondary | ICD-10-CM | POA: Diagnosis not present

## 2020-05-17 ENCOUNTER — Other Ambulatory Visit: Payer: Self-pay

## 2020-05-17 DIAGNOSIS — I739 Peripheral vascular disease, unspecified: Secondary | ICD-10-CM

## 2020-05-21 ENCOUNTER — Ambulatory Visit: Payer: Medicare Other

## 2020-05-21 ENCOUNTER — Ambulatory Visit (HOSPITAL_COMMUNITY)
Admission: RE | Admit: 2020-05-21 | Discharge: 2020-05-21 | Disposition: A | Discharge status: Home / Self Care | Payer: Medicare Other | Source: Ambulatory Visit | Attending: Surgery | Admitting: Surgery

## 2020-05-21 ENCOUNTER — Ambulatory Visit: Payer: Medicare Other | Admitting: Surgery

## 2020-05-21 ENCOUNTER — Encounter: Payer: Self-pay | Admitting: Surgery

## 2020-05-21 ENCOUNTER — Other Ambulatory Visit: Payer: Self-pay

## 2020-05-21 VITALS — BP 115/58 | HR 64 | Temp 97.5°F | Resp 20 | Ht 64.0 in | Wt 184.3 lb

## 2020-05-21 DIAGNOSIS — I739 Peripheral vascular disease, unspecified: Secondary | ICD-10-CM | POA: Insufficient documentation

## 2020-05-21 DIAGNOSIS — I872 Venous insufficiency (chronic) (peripheral): Secondary | ICD-10-CM

## 2020-05-21 NOTE — Progress Notes (Signed)
[facility]  Patient name: Barbara Lucero MRN: [id number] DOB: 12-04-1940 Sex: female   REQUESTING PROVIDER:    Dr. Margo Aye   REASON FOR CONSULT:    PAD  HISTORY OF PRESENT ILLNESS:   Barbara Lucero is a 80 y.o. female, who I saw in 2019 for a extensive left leg DVT.  She was several months out from her initial presentation, and so no intervention was performed.  She was on anticoagulation back then.  This has subsequently been stopped.  I have recommended referral to hematology however it was felt that her DVT was more related to her Crohn's and so she is no longer on anticoagulation as she has not been experiencing any symptoms of a Crohn's flare.  She continues to have pain and swelling as well as discoloration in both legs.  She was in the emergency department recently from a bleeding varicose vein in her right foot which was treated with a simple suture.  The patient has a history of Crohn's disease, anxiety and depression.   PAST MEDICAL HISTORY    Past Medical History:  Diagnosis Date  . Anxiety   . Arthritis   . Crohn disease (HCC)   . Depression   . Varicose veins of bilateral lower extremities with other complications      FAMILY HISTORY   Family History  Problem Relation Age of Onset  . Breast cancer Sister   . Breast cancer Sister   . Other Son        disabled  . Bipolar disorder Daughter     SOCIAL HISTORY:   Social History   Socioeconomic History  . Marital status: Widowed    Spouse name: Not on file  . Number of children: Not on file  . Years of education: Not on file  . Highest education level: Not on file  Occupational History  . Not on file  Tobacco Use  . Smoking status: Never Smoker  . Smokeless tobacco: Never Used  Vaping Use  . Vaping Use: Never used  Substance and Sexual Activity  . Alcohol use: No  . Drug use: No  . Sexual activity: Not Currently    Birth  control/protection: Post-menopausal  Other Topics Concern  . Not on file  Social History Narrative  . Not on file   Social Determinants of Health   Financial Resource Strain: Not on file  Food Insecurity: Not on file  Transportation Needs: Not on file  Physical Activity: Not on file  Stress: Not on file  Social Connections: Not on file  Intimate Partner Violence: Not on file    ALLERGIES:    No Known Allergies  CURRENT MEDICATIONS:    Current Outpatient Medications  Medication Sig Dispense Refill  . acetaminophen (TYLENOL) 650 MG CR tablet Take 650 mg by mouth every 8 (eight) hours as needed for pain.    . Ascorbic Acid (VITAMIN C) 1000 MG tablet Take 1,000 mg by mouth.     Marland Kitchen b complex vitamins tablet Take 1 tablet by mouth daily.    Marland Kitchen CALCIUM PO Take 1 tablet by mouth daily.    . COLLAGEN PO Take by mouth as directed.    . Multiple Vitamin (MULTIVITAMIN) tablet Take 1 tablet by mouth daily.    . pantoprazole (PROTONIX) 40 MG tablet Take 40 mg by mouth daily.     . Psyllium (METAMUCIL PO) Take by mouth as directed.     . sertraline (ZOLOFT) 25  MG tablet Take 25 mg by mouth as directed.    . simvastatin (ZOCOR) 10 MG tablet Take 10 mg by mouth at bedtime.    . solifenacin (VESICARE) 10 MG tablet Take 1 tablet (10 mg total) by mouth daily. At bedtime 30 tablet 11  . sulfaSALAzine (AZULFIDINE) 500 MG tablet Take 500 mg by mouth 2 (two) times daily.     . vitamin E 400 UNIT capsule Take 400 Units by mouth daily.     No current facility-administered medications for this visit.    REVIEW OF SYSTEMS:   [X]  denotes positive finding, [ ]  denotes negative finding Cardiac  Comments:  Chest pain or chest pressure:    Shortness of breath upon exertion:    Short of breath when lying flat:    Irregular heart rhythm:        Vascular    Pain in calf, thigh, or hip brought on by ambulation:    Pain in feet at night that wakes you up from your sleep:     Blood clot in your veins:     Leg swelling:  x       Pulmonary    Oxygen at home:    Productive cough:     Wheezing:         Neurologic    Sudden weakness in arms or legs:     Sudden numbness in arms or legs:     Sudden onset of difficulty speaking or slurred speech:    Temporary loss of vision in one eye:     Problems with dizziness:         Gastrointestinal    Blood in stool:      Vomited blood:         Genitourinary    Burning when urinating:     Blood in urine:        Psychiatric    Major depression:         Hematologic    Bleeding problems:    Problems with blood clotting too easily:        Skin    Rashes or ulcers:        Constitutional    Fever or chills:     PHYSICAL EXAM:   There were no vitals filed for this visit.  GENERAL: The patient is a well-nourished female, in no acute distress. The vital signs are documented above. CARDIAC: There is a regular rate and rhythm.  VASCULAR: Palpable pedal pulses.  1+ bilateral pitting edema with brawny discoloration of both legs up to the knee. PULMONARY: Nonlabored respirations MUSCULOSKELETAL: There are no major deformities or cyanosis. NEUROLOGIC: No focal weakness or paresthesias are detected. SKIN: There are no ulcers or rashes noted. PSYCHIATRIC: The patient has a normal affect.  STUDIES:   I have reviewed the following: +-------+-----------+-----------+------------+------------+  ABI/TBIToday's ABIToday's TBIPrevious ABIPrevious TBI  +-------+-----------+-----------+------------+------------+  Right 1.12    0.65                  +-------+-----------+-----------+------------+------------+  Left  0.93    0.58                  +-------+-----------+-----------+------------+------------+  ASSESSMENT and PLAN   Chronic venous insufficiency: The left leg is related to her DVT.  The etiology of the right leg is unclear at this time.  She does not have any evidence of arterial  insufficiency.  She is no longer on anticoagulation from her left leg DVT.  I  have recommended getting a venous reflux evaluation of both legs to see if she has any options for treatment.  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  More than likely she will need to be treated nonoperatively with skin moisturizers, leg elevation, and compression stockings.  We had an extensive discussion regarding the need for compression socks.  I would like to have her in 20-30, however she may only be able to tolerate 15-20.  She will follow-up in 3 months with her venous reflux exam.  If there are any abnormalities that would be amenable to endovenous laser ablation, they will be discussed at that time.   Charlena Cross, MD, FACS [facility] [phone] Pager [phone]

## 2020-05-22 ENCOUNTER — Ambulatory Visit: Payer: Medicare Other | Admitting: Obstetrics & Gynecology

## 2020-05-22 ENCOUNTER — Other Ambulatory Visit: Payer: Self-pay

## 2020-05-22 ENCOUNTER — Encounter: Payer: Self-pay | Admitting: Obstetrics & Gynecology

## 2020-05-22 VITALS — BP 142/70 | HR 84 | Ht 64.0 in | Wt 184.0 lb

## 2020-05-22 DIAGNOSIS — N3941 Urge incontinence: Secondary | ICD-10-CM | POA: Diagnosis not present

## 2020-05-22 DIAGNOSIS — N3281 Overactive bladder: Secondary | ICD-10-CM

## 2020-05-22 DIAGNOSIS — N813 Complete uterovaginal prolapse: Secondary | ICD-10-CM | POA: Diagnosis not present

## 2020-05-22 DIAGNOSIS — Z4689 Encounter for fitting and adjustment of other specified devices: Secondary | ICD-10-CM

## 2020-05-22 MED ORDER — MIRABEGRON ER 50 MG PO TB24: 50.0000 mg | ORAL_TABLET | Freq: Every day | ORAL | 11 refills | Status: DC

## 2020-05-22 NOTE — Progress Notes (Signed)
Chief Complaint  Patient presents with  . Pessary Check    Blood pressure (!) 142/70, pulse 84, height 5\' 4"  (1.626 m), weight 184 lb (83.5 kg).  Barbara Lucero presents today for routine follow up related to her pessary.   She uses a Milex ring with support #5 She reports no vaginal discharge and no vaginal bleeding   She is complaining of a worsening of her urinary incontinence, currently managed on vesicare 10 mg  Likert scale(1 not bothersome -5 very bothersome)  :  1  Exam reveals no undue vaginal mucosal pressure of breakdown, no discharge and no vaginal bleeding.  Vaginal Epithelial Abnormality Classification System:   0 0    No abnormalities 1    Epithelial erythema 2    Granulation tissue 3    Epithelial break or erosion, 1 cm or less 4    Epithelial break or erosion, 1 cm or greater  The pessary is removed, cleaned and replaced without difficulty.      ICD-10-CM   1. Pessary maintenance, Milex ring with support #5, placed 03/2015  Z46.89   2. Uterine procidentia: well managed with the pessary: chronic + stable  N81.3   3. OAB (overactive bladder): chronic + suboptimally managed  N32.81    on vesicare 10 but with increasing urine loss.  Will switch to Myrbetriq 50 qhs as attempt to better manage her symptoms  4. Urge incontinence  N39.41    as noted above    Meds ordered this encounter  Medications  . mirabegron ER (MYRBETRIQ) 50 MG TB24 tablet    Sig: Take 1 tablet (50 mg total) by mouth daily. At bedtime    Dispense:  30 tablet    Refill:  11   Discontinued Vesicare  Barbara Lucero will be sen back in 4 months for continued follow up.  Ronne Binning, MD  05/22/2020 10:34 AM

## 2020-05-28 ENCOUNTER — Other Ambulatory Visit: Payer: Self-pay

## 2020-05-28 DIAGNOSIS — I872 Venous insufficiency (chronic) (peripheral): Secondary | ICD-10-CM

## 2020-06-26 ENCOUNTER — Encounter: Payer: Self-pay | Admitting: Obstetrics & Gynecology

## 2020-07-17 DIAGNOSIS — G629 Polyneuropathy, unspecified: Secondary | ICD-10-CM | POA: Diagnosis not present

## 2020-07-17 DIAGNOSIS — L03119 Cellulitis of unspecified part of limb: Secondary | ICD-10-CM | POA: Diagnosis not present

## 2020-07-20 DIAGNOSIS — L03119 Cellulitis of unspecified part of limb: Secondary | ICD-10-CM | POA: Diagnosis not present

## 2020-07-20 DIAGNOSIS — G629 Polyneuropathy, unspecified: Secondary | ICD-10-CM | POA: Diagnosis not present

## 2020-08-02 ENCOUNTER — Ambulatory Visit (HOSPITAL_COMMUNITY): Payer: Medicare Other

## 2020-08-08 DIAGNOSIS — L03119 Cellulitis of unspecified part of limb: Secondary | ICD-10-CM | POA: Diagnosis not present

## 2020-08-21 ENCOUNTER — Ambulatory Visit (HOSPITAL_COMMUNITY)
Admission: RE | Admit: 2020-08-21 | Discharge: 2020-08-21 | Disposition: A | Discharge status: Home / Self Care | Payer: Medicare Other | Source: Ambulatory Visit | Attending: Cardiovascular Disease | Admitting: Cardiovascular Disease

## 2020-08-21 ENCOUNTER — Ambulatory Visit: Payer: Medicare Other

## 2020-08-21 ENCOUNTER — Ambulatory Visit (HOSPITAL_COMMUNITY): Payer: Medicare Other

## 2020-08-21 ENCOUNTER — Other Ambulatory Visit: Payer: Self-pay

## 2020-08-21 DIAGNOSIS — I340 Nonrheumatic mitral (valve) insufficiency: Secondary | ICD-10-CM | POA: Diagnosis not present

## 2020-08-21 DIAGNOSIS — I350 Nonrheumatic aortic (valve) stenosis: Secondary | ICD-10-CM | POA: Diagnosis not present

## 2020-08-21 LAB — ECHOCARDIOGRAM COMPLETE
AR max vel: 1 cm2
AV Area VTI: 1.12 cm2
AV Area mean vel: 0.95 cm2
AV Mean grad: 33 mmHg
AV Peak grad: 52.5 mmHg
Ao pk vel: 3.62 m/s
Area-P 1/2: 2.94 cm2
S' Lateral: 3.2 cm

## 2020-08-21 NOTE — Progress Notes (Signed)
*  PRELIMINARY RESULTS* Echocardiogram 2D Echocardiogram has been performed.  Stacey Drain 08/21/2020, 1:48 PM

## 2020-08-23 ENCOUNTER — Other Ambulatory Visit: Payer: Self-pay

## 2020-08-23 ENCOUNTER — Encounter (HOSPITAL_BASED_OUTPATIENT_CLINIC_OR_DEPARTMENT_OTHER): Payer: Medicare Other | Attending: Internal Medicine | Admitting: Internal Medicine

## 2020-08-23 DIAGNOSIS — I739 Peripheral vascular disease, unspecified: Secondary | ICD-10-CM | POA: Diagnosis not present

## 2020-08-23 DIAGNOSIS — L97811 Non-pressure chronic ulcer of other part of right lower leg limited to breakdown of skin: Secondary | ICD-10-CM | POA: Insufficient documentation

## 2020-08-23 DIAGNOSIS — I890 Lymphedema, not elsewhere classified: Secondary | ICD-10-CM | POA: Diagnosis not present

## 2020-08-23 DIAGNOSIS — I87331 Chronic venous hypertension (idiopathic) with ulcer and inflammation of right lower extremity: Secondary | ICD-10-CM | POA: Diagnosis not present

## 2020-08-23 DIAGNOSIS — I872 Venous insufficiency (chronic) (peripheral): Secondary | ICD-10-CM | POA: Insufficient documentation

## 2020-08-23 DIAGNOSIS — L97311 Non-pressure chronic ulcer of right ankle limited to breakdown of skin: Secondary | ICD-10-CM | POA: Diagnosis not present

## 2020-08-23 NOTE — Progress Notes (Signed)
Barbara Lucero, Barbara Lucero ([id number]) Visit Report for 08/23/2020 Chief Complaint Document Details Patient Name: Date of Service: Barbara Lucero 08/23/2020 2:45 PM Medical Record Number: [id number] Patient Account Number: [id number] Date of Birth/Sex: Treating RN: 16-Dec-1940 (80 y.o. Barbara Lucero Primary Care Provider: Nita Lucero Other Clinician: Referring Provider: Treating Provider/Extender: Barbara Lucero, Barbara Lucero in Treatment: 0 Information Obtained from: Patient Chief Complaint 08/23/2020; this is a patient here for a weeping area on her right lateral lower leg just below the lateral malleolus Electronic Signature(s) Signed: 08/23/2020 5:19:12 PM By: Barbara Najjar MD Entered By: Barbara Lucero on 08/23/2020 16:49:38 -------------------------------------------------------------------------------- HPI Details Patient Name: Date of Service: Barbara Lucero. 08/23/2020 2:45 PM Medical Record Number: [id number] Patient Account Number: [id number] Date of Birth/Sex: Treating RN: 12-08-1940 (80 y.o. Barbara Lucero Primary Care Provider: Nita Lucero Other Clinician: Referring Provider: Treating Provider/Extender: Barbara Lucero in Treatment: 0 History of Present Illness HPI Description: ADMISSION 08/23/2020 This is a 80 year old woman who arrives accompanied by her sister-in-law. 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 She has been dealing with chronic edema and discoloration of her legs for several years per her sister-in-law. I note that she saw Dr. Myra Lucero on 05/21/2020 and he noted an extensive left leg DVT in 2019 although she is no longer on oral anticoagulants. He felt she had chronic venous insufficiency with skin changes related to this. He ordered her 20/30 stockings which were zippered stockings but I do not get the sense that the patient was all that compliant. In any case she started to develop weeping edema fluid recently coming out of the right  lateral ankle serous drainage. She said it was pouring out last week. She saw her primary care provider on 08/08/2020 and was put on clindamycin which she is just finishing. She says the erythema feels better. Past medical history, aortic stenosis, chronic venous insufficiency, peripheral vascular disease, Crohn's disease which has been really recently quiescent, left hip fracture, DVT of the left leg in 2019. Notable that she has a very disabled son at home who she is the primary caregiver for Dr. Randie Lucero quoted an ABI of 1.12 on the right with a TBI of 0.65 on the left and 0.93 and a TBI of 0.58. In our clinic today the ABI was 0.83 on the right and 0.81 on the left Electronic Signature(s) Signed: 08/23/2020 5:19:12 PM By: Barbara Najjar MD Entered By: Barbara Lucero on 08/23/2020 16:52:36 -------------------------------------------------------------------------------- Physical Exam Details Patient Name: Date of Service: Barbara Lucero. 08/23/2020 2:45 PM Medical Record Number: [id number] Patient Account Number: [id number] Date of Birth/Sex: Treating RN: Jun 08, 1940 (80 y.o. Barbara Lucero Primary Care Provider: Nita Lucero Other Clinician: Referring Provider: Treating Provider/Extender: Barbara Lucero in Treatment: 0 Constitutional Patient is hypertensive.. Pulse regular and within target range for patient.Marland Kitchen Respirations regular, non-labored and within target range.. Temperature is normal and within the target range for the patient.Marland Kitchen Appears in no distress. Cardiovascular Pedal pulses are palpable on the right at the dorsalis pedis and posterior tibial.. Notes Wound exam; the patient does not have a technically open wound however you can see the area just below the right lateral malleolus that she says was leaking and no doubt that that is a friable area. The major problem here is very damaged skin from likely a combination of chronic venous insufficiency with lymphedema.  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 There is chronic inflammation here today but no evidence of active infection. Electronic Signature(s) Signed: 08/23/2020 5:19:12 PM By: Barbara Najjarobson, Barbara Nicholson MD Entered By: Barbara Najjarobson, Barbara Lucero on 08/23/2020 16:54:06 -------------------------------------------------------------------------------- Physician Orders Details Patient Name: Date of Service: Barbara HangerWRA Y, LO UISE B. 08/23/2020 2:45 PM Medical Record Number: [id number] Patient Account Number: [id number] Date of Birth/Sex: Treating RN: 09/27/1940 (80 y.o. Barbara SilenceF) Barbara Lucero, Barbara Lucero Primary Care Provider: Nita SellsHall, Barbara Lucero Other Clinician: Referring Provider: Treating Provider/Extender: Barbara Simsobson, Barbara Lucero Barbara Lucero, Barbara Lucero Weeks in Treatment: 0 Verbal / Phone Orders: No Diagnosis Coding Follow-up Appointments ppointment in 1 week. - Dr. Leanord Hawkingobson Return A Bathing/ Shower/ Hygiene May shower with protection but do not get wound dressing(s) wet. Edema Control - Lymphedema / SCD / Other Elevate legs to the level of the heart or above for 30 minutes daily and/or when sitting, a frequency of: - throughout the day. Avoid standing for long periods of time. Exercise regularly Moisturize legs daily. - left leg every night before bed. Compression stocking or Garment 20-30 mm/Hg pressure to: - Patient to wear compression stocking to left leg. Apply in the morning and remove at night. Wound Treatment Wound #1 - Ankle Wound Laterality: Right, Lateral Cleanser: Soap and Water 1 x Per Week Discharge Instructions: May shower and wash wound with dial antibacterial soap and water prior to dressing change. Cleanser: Wound Cleanser 1 x Per Week Discharge Instructions: Cleanse the wound with wound cleanser prior to applying a clean dressing using gauze sponges, not tissue or cotton balls. Peri-Wound Care: Triamcinolone 15 (g) 1 x Per  Week Discharge Instructions: LIBERALLY TO RIGHT LEG AND FOOT.Use triamcinolone 15 (g) as directed Peri-Wound Care: Sween Lotion (Moisturizing lotion) 1 x Per Week Discharge Instructions: Apply moisturizing lotion as directed Prim Dressing: KerraCel Ag Gelling Fiber Dressing, 2x2 in (silver alginate) 1 x Per Week ary Discharge Instructions: Apply silver alginate to wound bed as instructed Secondary Dressing: ABD Pad, 8x10 1 x Per Week Discharge Instructions: Apply over primary dressing as directed. Compression Wrap: ThreePress (3 layer compression wrap) 1 x Per Week Discharge Instructions: Apply three layer compression as directed. Electronic Signature(s) Signed: 08/23/2020 5:19:12 PM By: Barbara Najjarobson, Laticha Ferrucci MD Signed: 08/23/2020 6:57:58 PM By: Shawn Stalleaton, Barbara Lucero Entered By: Shawn Stalleaton, Barbara Lucero on 08/23/2020 16:06:38 -------------------------------------------------------------------------------- Problem List Details Patient Name: Date of Service: Barbara HangerWRA Y, LO UISE B. 08/23/2020 2:45 PM Medical Record Number: [id number] Patient Account Number: [id number] Date of Birth/Sex: Treating RN: 01/21/1941 (80 y.o. Barbara SilenceF) Barbara Lucero, Barbara Lucero Primary Care Provider: Nita SellsHall, Barbara Lucero Other Clinician: Referring Provider: Treating Provider/Extender: Barbara Simsobson, Dhyan Noah Barbara Lucero, Barbara Lucero Weeks in Treatment: 0 Active Problems ICD-10 Encounter Code Description Active Date MDM Diagnosis I87.331 Chronic venous hypertension (idiopathic) with ulcer and inflammation of right 08/23/2020 No Yes lower extremity I89.0 Lymphedema, not elsewhere classified 08/23/2020 No Yes L97.811 Non-pressure chronic ulcer of other part of right lower leg limited to breakdown 08/23/2020 No Yes of skin Inactive Problems Resolved Problems Electronic Signature(s) Signed: 08/23/2020 5:19:12 PM By: Barbara Najjarobson, Niang Mitcheltree MD Entered By: Barbara Najjarobson, Marji Kuehnel on 08/23/2020 16:48:56 -------------------------------------------------------------------------------- Progress Note  Details Patient Name: Date of Service: Barbara HangerWRA Y, LO UISE B. 08/23/2020 2:45 PM Medical Record Number: [id number] Patient Account Number: [id number] Date of Birth/Sex: Treating RN: 09/09/1940 (80 y.o. Barbara SilenceF) Barbara Lucero, Barbara Lucero Primary Care Provider: Nita SellsHall, Barbara Lucero Other Clinician: Referring Provider: Treating Provider/Extender: Barbara Simsobson, Laveta Gilkey Barbara Lucero, Barbara Lucero Weeks in Treatment: 0 Subjective Chief Complaint Information obtained from Patient 08/23/2020; this is a patient here for a weeping area on her  right lateral lower leg just below the lateral malleolus History of Present Illness (HPI) ADMISSION 08/23/2020 This is a 80 year old woman who arrives accompanied by her sister-in-law. 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 She has been dealing with chronic edema and discoloration of her legs for several years per her sister-in-law. I note that she saw Dr. Myra Lucero on 05/21/2020 and he noted an extensive left leg DVT in 2019 although she is no longer on oral anticoagulants. He felt she had chronic venous insufficiency with skin changes related to this. He ordered her 20/30 stockings which were zippered stockings but I do not get the sense that the patient was all that compliant. In any case she started to develop weeping edema fluid recently coming out of the right lateral ankle serous drainage. She said it was pouring out last week. She saw her primary care provider on 08/08/2020 and was put on clindamycin which she is just finishing. She says the erythema feels better. Past medical history, aortic stenosis, chronic venous insufficiency, peripheral vascular disease, Crohn's disease which has been really recently quiescent, left hip fracture, DVT of the left leg in 2019. Notable that she has a very disabled son at home who she is the primary caregiver for Dr. Randie Lucero quoted an ABI of 1.12 on the right with a TBI of 0.65 on the left and 0.93 and a TBI of 0.58. In our clinic today the ABI was 0.83 on the right and  0.81 on the left Patient History Information obtained from Patient. Allergies coconut (Reaction: diarrhea, abdominal pain), Latex, Natural Rubber (Reaction: rash, blisters) Family History Cancer - Siblings, Diabetes - Mother, Heart Disease - Mother,Father, Hypertension - Mother,Father, Stroke - Mother, Thyroid Problems - Siblings, No family history of Hereditary Spherocytosis, Kidney Disease, Lung Disease, Seizures, Tuberculosis. Social History Never smoker, Marital Status - Widowed, Alcohol Use - Never, Drug Use - No History, Caffeine Use - Rarely. Medical History Eyes Patient has history of Cataracts - bil removed Ear/Nose/Mouth/Throat Denies history of Chronic sinus problems/congestion, Middle ear problems Cardiovascular Patient has history of Deep Vein Thrombosis - left, Peripheral Venous Disease Gastrointestinal Patient has history of Crohnoos Integumentary (Skin) Denies history of History of Burn Musculoskeletal Patient has history of Osteoarthritis Oncologic Denies history of Received Chemotherapy, Received Radiation Psychiatric Patient has history of Confinement Anxiety - mild Denies history of Anorexia/bulimia Hospitalization/Surgery History - left hip repair. - tubal ligation. Medical A Surgical History Notes nd Ear/Nose/Mouth/Throat occaissional dysphagia Cardiovascular aortic valve stenosis Gastrointestinal Genella Rife Genitourinary stress incontinence Musculoskeletal chronic back pain Review of Systems (ROS) Constitutional Symptoms (General Health) Complains or has symptoms of Chills - better since antibiotics. Eyes Denies complaints or symptoms of Dry Eyes, Vision Changes, Glasses / Contacts. Ear/Nose/Mouth/Throat Denies complaints or symptoms of Chronic sinus problems or rhinitis. Respiratory Denies complaints or symptoms of Chronic or frequent coughs, Shortness of Breath. Cardiovascular Denies complaints or symptoms of Chest  pain. Gastrointestinal Denies complaints or symptoms of Frequent diarrhea, Nausea, Vomiting. Endocrine Denies complaints or symptoms of Heat/cold intolerance. Genitourinary Denies complaints or symptoms of Frequent urination. Integumentary (Skin) Denies complaints or symptoms of Wounds. Musculoskeletal Denies complaints or symptoms of Muscle Pain, Muscle Weakness. Neurologic Complains or has symptoms of Numbness/parasthesias - feet and legs. Psychiatric Complains or has symptoms of Claustrophobia. Denies complaints or symptoms of Suicidal. Objective Constitutional Patient is hypertensive.. Pulse regular and within target range for patient.Marland Kitchen Respirations regular, non-labored and within target range.. Temperature is normal and within the target range for the patient.Marland Kitchen  Appears in no distress. Vitals Time Taken: 3:22 PM, Height: 63 in, Source: Stated, Weight: 182 lbs, Source: Stated, BMI: 32.2, Temperature: 98.6 F, Pulse: 68 bpm, Respiratory Rate: 18 breaths/min, Blood Pressure: 145/72 mmHg. Cardiovascular Pedal pulses are palpable on the right at the dorsalis pedis and posterior tibial.. General Notes: Wound exam; the patient does not have a technically open wound however you can see the area just below the right lateral malleolus that she says was leaking and no doubt that that is a friable area. The major problem here is very damaged skin from likely a combination of chronic venous insufficiency with lymphedema. 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 There is chronic inflammation here today but no evidence of active infection. Integumentary (Hair, Skin) Wound #1 status is Open. Original cause of wound was Trauma. The date acquired was: 07/09/2020. The wound is located on the Right,Lateral Ankle. The wound measures 2cm length x 1.5cm width x 0.1cm depth; 2.356cm^2 area  and 0.236cm^3 volume. The wound is limited to skin breakdown. There is no tunneling or undermining noted. There is a medium amount of serous drainage noted. The wound margin is indistinct and nonvisible. There is no granulation within the wound bed. There is no necrotic tissue within the wound bed. Assessment Active Problems ICD-10 Chronic venous hypertension (idiopathic) with ulcer and inflammation of right lower extremity Lymphedema, not elsewhere classified Non-pressure chronic ulcer of other part of right lower leg limited to breakdown of skin Procedures Wound #1 Pre-procedure diagnosis of Wound #1 is a Venous Leg Ulcer located on the Right,Lateral Ankle . There was a Three Layer Compression Therapy Procedure by Rolan Lipa, RN. Post procedure Diagnosis Wound #1: Same as Pre-Procedure Plan Follow-up Appointments: Return Appointment in 1 week. - Dr. Leanord Lucero Bathing/ Shower/ Hygiene: May shower with protection but do not get wound dressing(s) wet. Edema Control - Lymphedema / SCD / Other: Elevate legs to the level of the heart or above for 30 minutes daily and/or when sitting, a frequency of: - throughout the day. Avoid standing for long periods of time. Exercise regularly Moisturize legs daily. - left leg every night before bed. Compression stocking or Garment 20-30 mm/Hg pressure to: - Patient to wear compression stocking to left leg. Apply in the morning and remove at night. WOUND #1: - Ankle Wound Laterality: Right, Lateral Cleanser: Soap and Water 1 x Per Week/ Discharge Instructions: May shower and wash wound with dial antibacterial soap and water prior to dressing change. Cleanser: Wound Cleanser 1 x Per Week/ Discharge Instructions: Cleanse the wound with wound cleanser prior to applying a clean dressing using gauze sponges, not tissue or cotton balls. Peri-Wound Care: Triamcinolone 15 (g) 1 x Per Week/ Discharge Instructions: LIBERALLY TO RIGHT LEG AND FOOT .Use  triamcinolone 15 (g) as directed Peri-Wound Care: Sween Lotion (Moisturizing lotion) 1 x Per Week/ Discharge Instructions: Apply moisturizing lotion as directed Prim Dressing: KerraCel Ag Gelling Fiber Dressing, 2x2 in (silver alginate) 1 x Per Week/ ary Discharge Instructions: Apply silver alginate to wound bed as instructed Secondary Dressing: ABD Pad, 8x10 1 x Per Week/ Discharge Instructions: Apply over primary dressing as directed. Com pression Wrap: ThreePress (3 layer compression wrap) 1 x Per Week/ Discharge Instructions: Apply three layer compression as directed. 1. The patient has chronic venous insufficiency with secondary lymphedema. The major issue here is that she has swelling in her upper 50% of  her lower legs and tightly adherent fibrotic skin distally towards the lower legs and ankles. This means that the lower extremity cannot handle any degree of swelling and hence the leaking serous fluid. 2. Dr. Myra Lucero ordered 20/30 below-knee stockings the patient tells me she has zippered stockings I have asked her to wear that on the left so we can see it next week. 3. We are going to put silver alginate ABDs on the lower ankle area and put her in 3 layer compression. I am hopeful this will bring down the swelling in the more proximal part of the calf. We also applied Liberal TCA to help with the inflammation and moisturizer 4. 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 I do not see the need for any antibiotics at this time. 6. No evidence of arterial insufficiency based on our ABIs, notes from Dr. Myra Lucero and clinical exam. 7. This patient is going to need ongoing compression stockings perhaps at a more aggressive level than 20/30. She might do well with external compression stockings we will talk about this next time. 8. Dr.  Myra Lucero ordered venous reflux studies although it does not look like these were ever done I will talk to her about this next time Electronic Signature(s) Signed: 08/23/2020 5:19:12 PM By: Barbara Najjar MD Entered By: Barbara Lucero on 08/23/2020 16:57:11 -------------------------------------------------------------------------------- HxROS Details Patient Name: Date of Service: Barbara Lucero. 08/23/2020 2:45 PM Medical Record Number: [id number] Patient Account Number: [id number] Date of Birth/Sex: Treating RN: January 03, 1941 (80 y.o. Tommye Standard Primary Care Provider: Nita Lucero Other Clinician: Referring Provider: Treating Provider/Extender: Barbara Lucero in Treatment: 0 Information Obtained From Patient Constitutional Symptoms (General Health) Complaints and Symptoms: Positive for: Chills - better since antibiotics Eyes Complaints and Symptoms: Negative for: Dry Eyes; Vision Changes; Glasses / Contacts Medical History: Positive for: Cataracts - bil removed Ear/Nose/Mouth/Throat Complaints and Symptoms: Negative for: Chronic sinus problems or rhinitis Medical History: Negative for: Chronic sinus problems/congestion; Middle ear problems Past Medical History Notes: occaissional dysphagia Respiratory Complaints and Symptoms: Negative for: Chronic or frequent coughs; Shortness of Breath Cardiovascular Complaints and Symptoms: Negative for: Chest pain Medical History: Positive for: Deep Vein Thrombosis - left; Peripheral Venous Disease Past Medical History Notes: aortic valve stenosis Gastrointestinal Complaints and Symptoms: Negative for: Frequent diarrhea; Nausea; Vomiting Medical History: Positive for: Crohns Past Medical History Notes: Gerd Endocrine Complaints and Symptoms: Negative for: Heat/cold intolerance Genitourinary Complaints and Symptoms: Negative for: Frequent urination Medical History: Past Medical History Notes: stress  incontinence Integumentary (Skin) Complaints and Symptoms: Negative for: Wounds Medical History: Negative for: History of Burn Musculoskeletal Complaints and Symptoms: Negative for: Muscle Pain; Muscle Weakness Medical History: Positive for: Osteoarthritis Past Medical History Notes: chronic back pain Neurologic Complaints and Symptoms: Positive for: Numbness/parasthesias - feet and legs Psychiatric Complaints and Symptoms: Positive for: Claustrophobia Negative for: Suicidal Medical History: Positive for: Confinement Anxiety - mild Negative for: Anorexia/bulimia Hematologic/Lymphatic Immunological Oncologic Medical History: Negative for: Received Chemotherapy; Received Radiation HBO Extended History Items Eyes: Cataracts Immunizations Pneumococcal Vaccine: Received Pneumococcal Vaccination: Yes Implantable Devices No devices added Hospitalization / Surgery History Type of Hospitalization/Surgery left hip repair tubal ligation Family and Social History Cancer: Yes - Siblings; Diabetes: Yes - Mother; Heart Disease: Yes - Mother,Father; Hereditary Spherocytosis: No; Hypertension:  Yes - Mother,Father; Kidney Disease: No; Lung Disease: No; Seizures: No; Stroke: Yes - Mother; Thyroid Problems: Yes - Siblings; Tuberculosis: No; Never smoker; Marital Status - Widowed; Alcohol Use: Never; Drug Use: No History; Caffeine Use: Rarely; Financial Concerns: No; Food, Clothing or Shelter Needs: No; Support System Lacking: No; Transportation Concerns: No Psychologist, prison and probation services) Signed: 08/23/2020 5:19:12 PM By: Barbara Najjar MD Signed: 08/23/2020 6:41:14 PM By: Zenaida Deed RN, BSN Entered By: Zenaida Deed on 08/23/2020 15:32:27 -------------------------------------------------------------------------------- SuperBill Details Patient Name: Date of Service: Barbara Lucero 08/23/2020 Medical Record Number: [id number] Patient Account Number: [id number] Date of  Birth/Sex: Treating RN: 1941/02/23 (80 y.o. Barbara Lucero Primary Care Provider: Nita Lucero Other Clinician: Referring Provider: Treating Provider/Extender: Barbara Lucero in Treatment: 0 Diagnosis Coding ICD-10 Codes Code Description [phone] Chronic venous hypertension (idiopathic) with ulcer and inflammation of right lower extremity I89.0 Lymphedema, not elsewhere classified L97.811 Non-pressure chronic ulcer of other part of right lower leg limited to breakdown of skin Facility Procedures CPT4 Code: [id number] Description: 99213 - WOUND CARE VISIT-LEV 3 EST PT Modifier: Quantity: 1 CPT4 Code: [id number] Description: (Facility Use Only) 02725DG - APPLY MULTLAY COMPRS LWR RT LEG Modifier: Quantity: 1 Physician Procedures Electronic Signature(s) Signed: 08/23/2020 5:19:12 PM By: Barbara Najjar MD Entered By: Barbara Lucero on 08/23/2020 16:58:09

## 2020-08-23 NOTE — Progress Notes (Signed)
Barbara Lucero, Barbara Lucero ([id number]) Visit Report for 08/23/2020 Abuse/Suicide Risk Screen Details Patient Name: Date of Service: Barbara Lucero 08/23/2020 2:45 PM Medical Record Number: [id number] Patient Account Number: [id number] Date of Birth/Sex: Treating RN: 06-Apr-1940 (80 y.o. Barbara Lucero Primary Care Karthika Glasper: [name] Other Clinician: Referring Naiara Lombardozzi: Treating Rosebud Koenen/Extender: [name] in Treatment: 0 Abuse/Suicide Risk Screen Items Answer ABUSE RISK SCREEN: Has anyone close to you tried to hurt or harm you recentlyo No Do you feel uncomfortable with anyone in your familyo No Has anyone forced you do things that you didnt want to doo No Electronic Signature(s) Signed: 08/23/2020 6:41:14 PM By: Zenaida Deed RN, BSN Entered By: Zenaida Deed on 08/23/2020 15:33:02 -------------------------------------------------------------------------------- Activities of Daily Living Details Patient Name: Date of Service: Barbara Lucero 08/23/2020 2:45 PM Medical Record Number: [id number] Patient Account Number: [id number] Date of Birth/Sex: Treating RN: October 05, 1940 (80 y.o. Barbara Lucero Primary Care Rosealee Recinos: [name] Other Clinician: Referring Christi Wirick: Treating Rosmarie Esquibel/Extender: [name] in Treatment: 0 Activities of Daily Living Items Answer Activities of Daily Living (Please select one for each item) Drive Automobile Completely Able T Medications ake Completely Able Use T elephone Completely Able Care for Appearance Completely Able Use T oilet Completely Able Bath / Shower Completely Able Dress Self Completely Able Feed Self Completely Able Walk Completely Able Get In / Out Bed Completely Able Housework Completely Able Prepare Meals Completely Able Handle Money Completely Able Shop for Self Completely Able Electronic Signature(s) Signed: 08/23/2020 6:41:14 PM By: Zenaida Deed RN, BSN Entered By:  Zenaida Deed on 08/23/2020 15:33:28 -------------------------------------------------------------------------------- Education Screening Details Patient Name: Date of Service: Barbara Lucero. 08/23/2020 2:45 PM Medical Record Number: [id number] Patient Account Number: [id number] Date of Birth/Sex: Treating RN: [birth year] (80 y.o. Barbara Lucero Primary Care Toi Stelly: [name] Other Clinician: Referring Sireen Halk: Treating Sharniece Gibbon/Extender: [name] in Treatment: 0 Primary Learner Assessed: Patient Learning Preferences/Education Level/Primary Language Learning Preference: Explanation, Demonstration, Printed Material Highest Education Level: High School Preferred Language: English Cognitive Barrier Language Barrier: No Translator Needed: No Memory Deficit: No Emotional Barrier: No Cultural/Religious Beliefs Affecting Medical Care: No Physical Barrier Impaired Vision: No Impaired Hearing: No Decreased Hand dexterity: No Knowledge/Comprehension Knowledge Level: High Comprehension Level: High Ability to understand written instructions: High Ability to understand verbal instructions: High Motivation Anxiety Level: Calm Cooperation: Cooperative Education Importance: Acknowledges Need Interest in Health Problems: Asks Questions Perception: Coherent Willingness to Engage in Self-Management High Activities: Readiness to Engage in Self-Management High Activities: Electronic Signature(s) Signed: 08/23/2020 6:41:14 PM By: Zenaida Deed RN, BSN Entered By: Zenaida Deed on 08/23/2020 15:34:02 -------------------------------------------------------------------------------- Fall Risk Assessment Details Patient Name: Date of Service: Barbara Lucero. 08/23/2020 2:45 PM Medical Record Number: [id number] Patient Account Number: [id number] Date of Birth/Sex: Treating RN: 28-Aug-1940 (80 y.o. Barbara Lucero Primary Care Shaida Route: [name] Other Clinician: Referring Freddi Forster: Treating Taquanna Borras/Extender: [name] in Treatment: 0 Fall Risk Assessment Items Have you had 2 or more falls in the last 12 monthso 0 No Have you had any fall that resulted in injury in the last 12 monthso 0 No FALLS RISK SCREEN History of falling - immediate or within 3 months 0 No Secondary diagnosis (Do you have 2 or more medical diagnoseso) 0 No Ambulatory aid None/bed rest/wheelchair/nurse 0 Yes Crutches/cane/walker 0 No Furniture 0 No Intravenous therapy Access/Saline/Heparin Lock 0 No Gait/Transferring Normal/ bed rest/ wheelchair 0 Yes  Weak (short steps with or without shuffle, stooped but able to lift head while walking, may seek 0 No support from furniture) Impaired (short steps with shuffle, may have difficulty arising from chair, head down, impaired 0 No balance) Mental Status Oriented to own ability 0 Yes Electronic Signature(s) Signed: 08/23/2020 6:41:14 PM By: Zenaida Deed RN, BSN Entered By: Zenaida Deed on 08/23/2020 15:34:19 -------------------------------------------------------------------------------- Foot Assessment Details Patient Name: Date of Service: Barbara Lucero. 08/23/2020 2:45 PM Medical Record Number: [id number] Patient Account Number: [id number] Date of Birth/Sex: Treating RN: Jul 15, 1940 (80 y.o. Barbara Lucero Primary Care Dekota Shenk: [name] Other Clinician: Referring Etheline Geppert: Treating Marithza Malachi/Extender: [name] in Treatment: 0 Foot Assessment Items Site Locations + = Sensation present, - = Sensation absent, C = Callus, U = Ulcer R = Redness, W = Warmth, M = Maceration, PU = Pre-ulcerative lesion F = Fissure, S = Swelling, D = Dryness Assessment Right: Left: Other Deformity: No No Prior Foot Ulcer: No No Prior Amputation: No No Charcot Joint: No No Ambulatory Status: Ambulatory Without Help Gait: Steady Electronic  Signature(s) Signed: 08/23/2020 6:41:14 PM By: Zenaida Deed RN, BSN Entered By: Zenaida Deed on 08/23/2020 15:36:53 -------------------------------------------------------------------------------- Nutrition Risk Screening Details Patient Name: Date of Service: Barbara Lucero 08/23/2020 2:45 PM Medical Record Number: [id number] Patient Account Number: [id number] Date of Birth/Sex: Treating RN: October 14, 1940 (80 y.o. Billy Coast, Linda Primary Care Zarriah Starkel: [name] Other Clinician: Referring Aliyha Fornes: Treating Alonnah Lampkins/Extender: [name] in Treatment: 0 Height (in): 63 Weight (lbs): 182 Body Mass Index (BMI): 32.2 Nutrition Risk Screening Items Score Screening NUTRITION RISK SCREEN: I have an illness or condition that made me change the kind and/or amount of food I eat 0 No I eat fewer than two meals per day 0 No I eat few fruits and vegetables, or milk products 0 No I have three or more drinks of beer, liquor or wine almost every day 0 No I have tooth or mouth problems that make it hard for me to eat 2 Yes I don't always have enough money to buy the food I need 0 No I eat alone most of the time 0 No I take three or more different prescribed or over-the-counter drugs a day 1 Yes Without wanting to, I have lost or gained 10 pounds in the last six months 0 No I am not always physically able to shop, cook and/or feed myself 0 No Nutrition Protocols Good Risk Protocol Moderate Risk Protocol 0 Provide education on nutrition High Risk Proctocol Risk Level: Moderate Risk Score: 3 Electronic Signature(s) Signed: 08/23/2020 6:41:14 PM By: Zenaida Deed RN, BSN Entered By: Zenaida Deed on 08/23/2020 15:35:05

## 2020-08-28 ENCOUNTER — Telehealth: Payer: Self-pay | Admitting: *Deleted

## 2020-08-28 DIAGNOSIS — I350 Nonrheumatic aortic (valve) stenosis: Secondary | ICD-10-CM

## 2020-08-28 NOTE — Telephone Encounter (Signed)
-----   Message from Wendall Stade, MD sent at 08/23/2020  6:29 PM EDT ----- Moderate AS f/u echo in 6 months

## 2020-08-28 NOTE — Progress Notes (Signed)
JEROLENE, KUPFER ([id number]) Visit Report for 08/23/2020 Allergy List Details Patient Name: Date of Service: Barbara Lucero 08/23/2020 2:45 PM Medical Record Number: [id number] Patient Account Number: [id number] Date of Birth/Sex: Treating RN: 14-Jan-1941 (80 y.o. Female) Baruch Gouty Primary Care Iisha Soyars: Allyn Kenner Other Clinician: Referring Evalyn Shultis: Treating Janifer Gieselman/Extender: Adolm Joseph in Treatment: 0 Allergies Active Allergies coconut Reaction: diarrhea, abdominal pain Latex, Natural Rubber Reaction: rash, blisters Allergy Notes Electronic Signature(s) Signed: 08/23/2020 6:41:14 PM By: Baruch Gouty RN, BSN Entered By: Baruch Gouty on 08/23/2020 15:24:23 -------------------------------------------------------------------------------- Arrival Information Details Patient Name: Date of Service: Barbara Lucero 08/23/2020 2:45 PM Medical Record Number: [id number] Patient Account Number: [id number] Date of Birth/Sex: Treating RN: [birth year] (80 y.o. Female) Baruch Gouty Primary Care Jarrod Mcenery: Allyn Kenner Other Clinician: Referring Tillie Viverette: Treating Aryanne Gilleland/Extender: Adolm Joseph in Treatment: 0 Visit Information Patient Arrived: Gilford Rile Arrival Time: 15:21 Accompanied By: sister Transfer Assistance: None Patient Identification Verified: Yes Secondary Verification Process Completed: Yes Patient Requires Transmission-Based Precautions: No Patient Has Alerts: No Electronic Signature(s) Signed: 08/23/2020 6:41:14 PM By: Baruch Gouty RN, BSN Entered By: Baruch Gouty on 08/23/2020 15:21:59 -------------------------------------------------------------------------------- Clinic Level of Care Assessment Details Patient Name: Date of Service: Barbara Lucero 08/23/2020 2:45 PM Medical Record Number: [id number] Patient Account Number: [id number] Date of Birth/Sex: Treating RN: [birth year] (80 y.o. Female) Deon Pilling Primary Care Murtaza Shell: Allyn Kenner Other Clinician: Referring Kais Monje: Treating Berkleigh Beckles/Extender: Adolm Joseph in Treatment: 0 Clinic Level of Care Assessment Items TOOL 1 Quantity Score X- 1 0 Use when EandM and Procedure is performed on INITIAL visit ASSESSMENTS - Nursing Assessment / Reassessment X- 1 20 General Physical Exam (combine w/ comprehensive assessment (listed just below) when performed on new pt. evals) X- 1 25 Comprehensive Assessment (HX, ROS, Risk Assessments, Wounds Hx, etc.) ASSESSMENTS - Wound and Skin Assessment / Reassessment X- 1 10 Dermatologic / Skin Assessment (not related to wound area) ASSESSMENTS - Ostomy and/or Continence Assessment and Care _0  - 0 Incontinence Assessment and Management _1  - 0 Ostomy Care Assessment and Management (repouching, etc.) PROCESS - Coordination of Care X - Simple Patient / Family Education for ongoing care 1 15 _2  - 0 Complex (extensive) Patient / Family Education for ongoing care X- 1 10 Staff obtains Programmer, systems, Records, T Results / Process Orders est _3  - 0 Staff telephones HHA, Nursing Homes / Clarify orders / etc _4  - 0 Routine Transfer to another Facility (non-emergent condition) _5  - 0 Routine Hospital Admission (non-emergent condition) X- 1 15 New Admissions / Biomedical engineer / Ordering NPWT Apligraf, etc. , _6  - 0 Emergency Hospital Admission (emergent condition) PROCESS - Special Needs _7  - 0 Pediatric / Minor Patient Management _8  - 0 Isolation Patient Management _9  - 0 Hearing / Language / Visual special needs _10  - 0 Assessment of Community assistance (transportation, D/C planning, etc.) _11  - 0 Additional assistance / Altered mentation _12  - 0 Support Surface(s) Assessment (bed, cushion, seat, etc.) INTERVENTIONS - Miscellaneous _13  - 0 External ear exam _14  - 0 Patient Transfer (multiple staff / Civil Service fast streamer / Similar devices) _15  - 0 Simple Staple / Suture  removal (25 or less) _16  - 0 Complex Staple / Suture removal (26 or more) _17  - 0 Hypo/Hyperglycemic Management (do not check if billed separately) X- 1 15 Ankle / Brachial Index (ABI) - do not check if billed separately Has the patient been seen at the hospital within the last three  years: Yes Total Score: 110 Level Of Care: New/Established - Level 3 Electronic Signature(s) Signed: 08/23/2020 6:57:58 PM By: Deon Pilling Signed: 08/23/2020 6:57:58 PM By: Deon Pilling Entered By: Deon Pilling on 08/23/2020 16:07:17 -------------------------------------------------------------------------------- Compression Therapy Details Patient Name: Date of Service: Barbara Lucero. 08/23/2020 2:45 PM Medical Record Number: [id number] Patient Account Number: [id number] Date of Birth/Sex: Treating RN: 1940-08-26 (80 y.o. Female) Deon Pilling Primary Care Shanena Pellegrino: Allyn Kenner Other Clinician: Referring Lorilyn Laitinen: Treating Monnie Gudgel/Extender: Adolm Joseph in Treatment: 0 Compression Therapy Performed for Wound Assessment: Wound #1 Right,Lateral Ankle Performed By: Clinician Leane Call, RN Compression Type: Three Layer Post Procedure Diagnosis Same as Pre-procedure Electronic Signature(s) Signed: 08/23/2020 6:57:58 PM By: Deon Pilling Entered By: Deon Pilling on 08/23/2020 16:06:52 -------------------------------------------------------------------------------- Encounter Discharge Information Details Patient Name: Date of Service: Barbara Lucero 08/23/2020 2:45 PM Medical Record Number: [id number] Patient Account Number: [id number] Date of Birth/Sex: Treating RN: 1940-06-23 (80 y.o. Female) Leane Call Primary Care Johntavious Francom: Allyn Kenner Other Clinician: Referring Omari Koslosky: Treating Makana Feigel/Extender: Adolm Joseph in Treatment: 0 Encounter Discharge Information Items Discharge Condition: Stable Ambulatory Status: Wheelchair Discharge  Destination: Home Transportation: Private Auto Accompanied By: alone Schedule Follow-up Appointment: No Clinical Summary of Care: Patient Declined Electronic Signature(s) Signed: 08/28/2020 6:17:43 PM By: Leane Call Entered By: Leane Call on 08/23/2020 16:13:32 -------------------------------------------------------------------------------- Lower Extremity Assessment Details Patient Name: Date of Service: Barbara Lucero 08/23/2020 2:45 PM Medical Record Number: [id number] Patient Account Number: [id number] Date of Birth/Sex: Treating RN: 10-May-1940 (80 y.o. Female) Baruch Gouty Primary Care Yalitza Teed: Allyn Kenner Other Clinician: Referring Cesily Cuoco: Treating Sanvika Cuttino/Extender: Adolm Joseph in Treatment: 0 Edema Assessment Assessed: [Left: No] [Right: No] Edema: [Left: Yes] [Right: Yes] Calf Left: Right: Point of Measurement: From Medial Instep 35 cm Ankle Left: Right: Point of Measurement: From Medial Instep 20.8 cm Vascular Assessment Pulses: Dorsalis Pedis Palpable: [Right:Yes] Blood Pressure: Brachial: [Left:229] [Right:229] Ankle: [Left:Dorsalis Pedis: 185 0.81] [Right:Dorsalis Pedis: 190 0.83] Electronic Signature(s) Signed: 08/23/2020 6:41:14 PM By: Baruch Gouty RN, BSN Entered By: Baruch Gouty on 08/23/2020 15:52:51 -------------------------------------------------------------------------------- Multi Wound Chart Details Patient Name: Date of Service: Barbara Lucero 08/23/2020 2:45 PM Medical Record Number: [id number] Patient Account Number: [id number] Date of Birth/Sex: Treating RN: 1940/09/26 (80 y.o. Female) Deon Pilling Primary Care Kamoni Gentles: Allyn Kenner Other Clinician: Referring Loreena Valeri: Treating Khing Belcher/Extender: Adolm Joseph in Treatment: 0 Vital Signs Height(in): 7 Pulse(bpm): 41 Weight(lbs): 49 Blood Pressure(mmHg): 145/72 Body Mass Index(BMI): 32 Temperature(F):  98.6 Respiratory Rate(breaths/min): 18 Photos: [1:No Photos Right, Lateral Ankle] [N/A:N/A N/A] Wound Location: [1:Trauma] [N/A:N/A] Wounding Event: [1:Venous Leg Ulcer] [N/A:N/A] Primary Etiology: [1:Cataracts, Deep Vein Thrombosis,] [N/A:N/A] Comorbid History: [1:Peripheral Venous Disease, Crohns, Osteoarthritis, Confinement Anxiety 07/09/2020] [N/A:N/A] Date Acquired: [1:0] [N/A:N/A] Weeks of Treatment: [1:Open] [N/A:N/A] Wound Status: [1:2x1.5x0.1] [N/A:N/A] Measurements L x W x D (cm) [1:2.356] [N/A:N/A] A (cm) : rea [1:0.236] [N/A:N/A] Volume (cm) : [1:Partial Thickness] [N/A:N/A] Classification: [1:Medium] [N/A:N/A] Exudate A mount: [1:Serous] [N/A:N/A] Exudate Type: [1:amber] [N/A:N/A] Exudate Color: [1:Indistinct, nonvisible] [N/A:N/A] Wound Margin: [1:None Present (0%)] [N/A:N/A] Granulation Amount: [1:None Present (0%)] [N/A:N/A] Necrotic Amount: [1:Fascia: No] [N/A:N/A] Exposed Structures: [1:Fat Layer (Subcutaneous Tissue): No Tendon: No Muscle: No Joint: No Bone: No Limited to Skin Breakdown Large (67-100%)] [N/A:N/A] Epithelialization: [1:Compression Therapy] [N/A:N/A] Treatment Notes Wound #1 (Ankle) Wound Laterality: Right, Lateral Cleanser Soap and Water Discharge Instruction: May shower and wash wound with dial antibacterial soap and water prior to dressing change.  Wound Cleanser Discharge Instruction: Cleanse the wound with wound cleanser prior to applying a clean dressing using gauze sponges, not tissue or cotton balls. Peri-Wound Care Triamcinolone 15 (g) Discharge Instruction: LIBERALLY TO RIGHT LEG AND FOOT.Use triamcinolone 15 (g) as directed Sween Lotion (Moisturizing lotion) Discharge Instruction: Apply moisturizing lotion as directed Topical Primary Dressing KerraCel Ag Gelling Fiber Dressing, 2x2 in (silver alginate) Discharge Instruction: Apply silver alginate to wound bed as instructed Secondary Dressing ABD Pad, 8x10 Discharge Instruction:  Apply over primary dressing as directed. Secured With Compression Wrap ThreePress (3 layer compression wrap) Discharge Instruction: Apply three layer compression as directed. Compression Stockings Add-Ons Electronic Signature(s) Signed: 08/23/2020 5:19:12 PM By: Linton Ham MD Signed: 08/23/2020 6:57:58 PM By: Deon Pilling Entered By: Linton Ham on 08/23/2020 16:49:04 -------------------------------------------------------------------------------- Multi-Disciplinary Care Plan Details Patient Name: Date of Service: Barbara Lucero 08/23/2020 2:45 PM Medical Record Number: [id number] Patient Account Number: [id number] Date of Birth/Sex: Treating RN: 05-24-1940 (80 y.o. Female) Deon Pilling Primary Care Cesar Rogerson: Allyn Kenner Other Clinician: Referring Nikisha Fleece: Treating Kiylee Thoreson/Extender: Adolm Joseph in Treatment: 0 Active Inactive Orientation to the Wound Care Program Nursing Diagnoses: Knowledge deficit related to the wound healing center program Goals: Patient/caregiver will verbalize understanding of the [place] Date Initiated: 08/23/2020 Target Resolution Date: 08/30/2020 Goal Status: Active Interventions: Provide education on orientation to the wound center Notes: Pain, Acute or Chronic Nursing Diagnoses: Pain, acute or chronic: actual or potential Potential alteration in comfort, pain Goals: Patient will verbalize adequate pain control and receive pain control interventions during procedures as needed Date Initiated: 08/23/2020 Target Resolution Date: 09/14/2020 Goal Status: Active Patient/caregiver will verbalize comfort level met Date Initiated: 08/23/2020 Target Resolution Date: 08/31/2020 Goal Status: Active Interventions: Complete pain assessment as per visit requirements Provide education on pain management Reposition patient for comfort Treatment Activities: Administer pain control measures as ordered :  08/23/2020 Notes: Venous Leg Ulcer Nursing Diagnoses: Potential for venous Insuffiency (use before diagnosis confirmed) Goals: Patient will maintain optimal edema control Date Initiated: 08/23/2020 Target Resolution Date: 09/20/2020 Goal Status: Active Interventions: Assess peripheral edema status every visit. Provide education on venous insufficiency Treatment Activities: Non-invasive vascular studies : 08/23/2020 T ordered outside of clinic : 08/23/2020 est Therapeutic compression applied : 08/23/2020 Notes: Wound/Skin Impairment Nursing Diagnoses: Knowledge deficit related to ulceration/compromised skin integrity Goals: Patient/caregiver will verbalize understanding of skin care regimen Date Initiated: 08/23/2020 Target Resolution Date: 08/31/2020 Goal Status: Active Interventions: Assess patient/caregiver ability to obtain necessary supplies Assess patient/caregiver ability to perform ulcer/skin care regimen upon admission and as needed Provide education on ulcer and skin care Treatment Activities: Skin care regimen initiated : 08/23/2020 Topical wound management initiated : 08/23/2020 Notes: Electronic Signature(s) Signed: 08/23/2020 6:57:58 PM By: Deon Pilling Entered By: Deon Pilling on 08/23/2020 16:01:42 -------------------------------------------------------------------------------- Pain Assessment Details Patient Name: Date of Service: Barbara Lucero 08/23/2020 2:45 PM Medical Record Number: [id number] Patient Account Number: [id number] Date of Birth/Sex: Treating RN: 08-18-1940 (80 y.o. Female) Baruch Gouty Primary Care Jo-Ann Johanning: Allyn Kenner Other Clinician: Referring Jaquavius Hudler: Treating Kai Railsback/Extender: Adolm Joseph in Treatment: 0 Active Problems Location of Pain Severity and Description of Pain Patient Has Paino Yes Site Locations Pain Location: Pain in Ulcers With Dressing Change: Yes Duration of the Pain. Constant /  Intermittento Intermittent Rate the pain. Current Pain Level: 0 Worst Pain Level: 4 Least Pain Level: 0 Character of Pain Describe the Pain: Burning Pain Management and Medication Current Pain Management: Medication: Yes  Is the Current Pain Management Adequate: Adequate How does your wound impact your activities of daily livingo Sleep: No Bathing: No Appetite: No Relationship With Others: No Bladder Continence: No Emotions: No Bowel Continence: No Work: No Toileting: No Drive: No Dressing: No Hobbies: No Electronic Signature(s) Signed: 08/23/2020 6:41:14 PM By: Baruch Gouty RN, BSN Entered By: Baruch Gouty on 08/23/2020 15:48:13 -------------------------------------------------------------------------------- Patient/Caregiver Education Details Patient Name: Date of Service: Barbara Lucero 6/30/2022andnbsp2:45 PM Medical Record Number: [id number] Patient Account Number: [id number] Date of Birth/Gender: Treating RN: July 30, 1940 (80 y.o. Female) Deon Pilling Primary Care Physician: Allyn Kenner Other Clinician: Referring Physician: Treating Physician/Extender: Adolm Joseph in Treatment: 0 Education Assessment Education Provided To: Patient Education Topics Provided Welcome T [facility]: o Handouts: Welcome T [facility] o Methods: Explain/Verbal, Printed Responses: Reinforcements needed Electronic Signature(s) Signed: 08/23/2020 6:57:58 PM By: Deon Pilling Entered By: Deon Pilling on 08/23/2020 16:01:55 -------------------------------------------------------------------------------- Wound Assessment Details Patient Name: Date of Service: Barbara Lucero 08/23/2020 2:45 PM Medical Record Number: [id number] Patient Account Number: [id number] Date of Birth/Sex: Treating RN: [birth year] (80 y.o. Female) Baruch Gouty Primary Care Mylinda Brook: Allyn Kenner Other Clinician: Referring Betul Brisky: Treating Mazel Villela/Extender:  Adolm Joseph in Treatment: 0 Wound Status Wound Number: 1 Primary Venous Leg Ulcer Etiology: Wound Location: Right, Lateral Ankle Wound Open Wounding Event: Trauma Status: Date Acquired: 07/09/2020 Comorbid Cataracts, Deep Vein Thrombosis, Peripheral Venous Disease, Weeks Of Treatment: 0 History: Crohns, Osteoarthritis, Confinement Anxiety Clustered Wound: No Photos Wound Measurements Length: (cm) 2 Width: (cm) 1.5 Depth: (cm) 0.1 Area: (cm) 2.356 Volume: (cm) 0.236 % Reduction in Area: 0% % Reduction in Volume: 0% Epithelialization: Large (67-100%) Tunneling: No Undermining: No Wound Description Classification: Partial Thickness Wound Margin: Indistinct, nonvisible Exudate Amount: Medium Exudate Type: Serous Exudate Color: amber Foul Odor After Cleansing: No Slough/Fibrino No Wound Bed Granulation Amount: None Present (0%) Exposed Structure Necrotic Amount: None Present (0%) Fascia Exposed: No Fat Layer (Subcutaneous Tissue) Exposed: No Tendon Exposed: No Muscle Exposed: No Joint Exposed: No Bone Exposed: No Limited to Skin Breakdown Treatment Notes Wound #1 (Ankle) Wound Laterality: Right, Lateral Cleanser Soap and Water Discharge Instruction: May shower and wash wound with dial antibacterial soap and water prior to dressing change. Wound Cleanser Discharge Instruction: Cleanse the wound with wound cleanser prior to applying a clean dressing using gauze sponges, not tissue or cotton balls. Peri-Wound Care Triamcinolone 15 (g) Discharge Instruction: LIBERALLY TO RIGHT LEG AND FOOT.Use triamcinolone 15 (g) as directed Sween Lotion (Moisturizing lotion) Discharge Instruction: Apply moisturizing lotion as directed Topical Primary Dressing KerraCel Ag Gelling Fiber Dressing, 2x2 in (silver alginate) Discharge Instruction: Apply silver alginate to wound bed as instructed Secondary Dressing ABD Pad, 8x10 Discharge Instruction: Apply over  primary dressing as directed. Secured With Compression Wrap ThreePress (3 layer compression wrap) Discharge Instruction: Apply three layer compression as directed. Compression Stockings Add-Ons Electronic Signature(s) Signed: 08/24/2020 10:25:49 AM By: Sandre Kitty Signed: 08/24/2020 1:24:03 PM By: Baruch Gouty RN, BSN Previous Signature: 08/23/2020 6:41:14 PM Version By: Baruch Gouty RN, BSN Entered By: Sandre Kitty on 08/24/2020 09:08:40 -------------------------------------------------------------------------------- Vitals Details Patient Name: Date of Service: Barbara Lucero 08/23/2020 2:45 PM Medical Record Number: [id number] Patient Account Number: [id number] Date of Birth/Sex: Treating RN: 1940/07/10 (80 y.o. Female) Baruch Gouty Primary Care Conception Doebler: Allyn Kenner Other Clinician: Referring Thos Matsumoto: Treating Tiwan Schnitker/Extender: Adolm Joseph in Treatment: 0 Vital Signs Time Taken: 15:22 Temperature (F): 98.6 Height (  in): 63 Pulse (bpm): 68 Source: Stated Respiratory Rate (breaths/min): 18 Weight (lbs): 182 Blood Pressure (mmHg): 145/72 Source: Stated Reference Range: 80 - 120 mg / dl Body Mass Index (BMI): 32.2 Electronic Signature(s) Signed: 08/23/2020 6:41:14 PM By: Baruch Gouty RN, BSN Entered By: Baruch Gouty on 08/23/2020 15:22:37

## 2020-08-28 NOTE — Telephone Encounter (Signed)
Echo order placed.

## 2020-08-30 ENCOUNTER — Other Ambulatory Visit: Payer: Self-pay

## 2020-08-30 ENCOUNTER — Encounter (HOSPITAL_BASED_OUTPATIENT_CLINIC_OR_DEPARTMENT_OTHER): Payer: Medicare Other | Attending: Internal Medicine | Admitting: Internal Medicine

## 2020-08-30 DIAGNOSIS — Z86718 Personal history of other venous thrombosis and embolism: Secondary | ICD-10-CM | POA: Insufficient documentation

## 2020-08-30 DIAGNOSIS — I890 Lymphedema, not elsewhere classified: Secondary | ICD-10-CM | POA: Insufficient documentation

## 2020-08-30 DIAGNOSIS — I872 Venous insufficiency (chronic) (peripheral): Secondary | ICD-10-CM | POA: Insufficient documentation

## 2020-08-30 DIAGNOSIS — L97319 Non-pressure chronic ulcer of right ankle with unspecified severity: Secondary | ICD-10-CM | POA: Diagnosis not present

## 2020-08-30 DIAGNOSIS — L97811 Non-pressure chronic ulcer of other part of right lower leg limited to breakdown of skin: Secondary | ICD-10-CM | POA: Diagnosis not present

## 2020-08-30 DIAGNOSIS — I739 Peripheral vascular disease, unspecified: Secondary | ICD-10-CM | POA: Insufficient documentation

## 2020-09-04 NOTE — Progress Notes (Signed)
Barbara Lucero, Barbara Lucero ([id number]) Visit Report for 08/30/2020 HPI Details Patient Name: Date of Service: Barbara Lucero 08/30/2020 1:00 PM Medical Record Number: [id number] Patient Account Number: [id number] Date of Birth/Sex: Treating RN: Nov 15, 1940 (80 y.o. Barbara Lucero Primary Care Provider: Nita Lucero Other Clinician: Referring Provider: Treating Provider/Extender: Barbara Lucero in Treatment: 1 History of Present Illness HPI Description: ADMISSION 08/23/2020 This is a 80 year old woman who arrives accompanied by her sister-in-law. 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 She has been dealing with chronic edema and discoloration of her legs for several years per her sister-in-law. I note that she saw Dr. Myra Lucero on 05/21/2020 and he noted an extensive left leg DVT in 2019 although she is no longer on oral anticoagulants. He felt she had chronic venous insufficiency with skin changes related to this. He ordered her 20/30 stockings which were zippered stockings but I do not get the sense that the patient was all that compliant. In any case she started to develop weeping edema fluid recently coming out of the right lateral ankle serous drainage. She said it was pouring out last week. She saw her primary care provider on 08/08/2020 and was put on clindamycin which she is just finishing. She says the erythema feels better. Past medical history, aortic stenosis, chronic venous insufficiency, peripheral vascular disease, Crohn's disease which has been really recently quiescent, left hip fracture, DVT of the left leg in 2019. Notable that she has a very disabled son at home who she is the primary caregiver for Dr. Randie Lucero quoted an ABI of 1.12 on the right with a TBI of 0.65 on the left and 0.93 and a TBI of 0.58. In our clinic today the ABI was 0.83 on the right and 0.81 on the left 7/7 the patient's wound on the right lateral ankle is healed. Her edema control is much better. It  turns out she does not have stockings but came in with Tubigrip on the left leg Electronic Signature(s) Signed: 08/30/2020 8:35:52 PM By: Barbara Najjar MD Entered By: Barbara Lucero on 08/30/2020 13:15:09 -------------------------------------------------------------------------------- Physical Exam Details Patient Name: Date of Service: Barbara Lucero B. 08/30/2020 1:00 PM Medical Record Number: [id number] Patient Account Number: [id number] Date of Birth/Sex: Treating RN: 07-05-1940 (80 y.o. Barbara Lucero Primary Care Provider: Nita Lucero Other Clinician: Referring Provider: Treating Provider/Extender: Barbara Lucero in Treatment: 1 Constitutional Patient is hypertensive.. Pulse regular and within target range for patient.Marland Kitchen Respirations regular, non-labored and within target range.. Temperature is normal and within the target range for the patient.Marland Kitchen Appears in no distress. Cardiovascular Pedal pulses are palpable on the right. Notes Wound exam; although leaking areas here are have resolved. Much better edema control with our wraps. No evidence of cellulitis Electronic Signature(s) Signed: 08/30/2020 8:35:52 PM By: Barbara Najjar MD Entered By: Barbara Lucero on 08/30/2020 13:15:57 -------------------------------------------------------------------------------- Physician Orders Details Patient Name: Date of Service: Barbara Lucero B. 08/30/2020 1:00 PM Medical Record Number: [id number] Patient Account Number: [id number] Date of Birth/Sex: Treating RN: [birth year] (80 y.o. Barbara Lucero, Barbara Lucero Primary Care Provider: Nita Lucero Other Clinician: Referring Provider: Treating Provider/Extender: Barbara Lucero in Treatment: 1 Verbal / Phone Orders: No Diagnosis Coding ICD-10 Coding Code Description [id number] Chronic venous hypertension (idiopathic) with ulcer and inflammation of right lower extremity I89.0 Lymphedema, not elsewhere  classified L97.811 Non-pressure chronic ulcer of other part of right lower leg limited to breakdown of skin Discharge From  [facility] Services Discharge from Wound Care Center - Call if any future wound care needs. Edema Control - Lymphedema / SCD / Other Elevate legs to the level of the heart or above for 30 minutes daily and/or when sitting, a frequency of: - 3-4 times a day throughout the day. Avoid standing for long periods of time. Patient to wear own compression stockings every day. Exercise regularly Moisturize legs daily. - every night before bed. Compression stocking or Garment 20-30 mm/Hg pressure to: - Patient to purchase compression stockings. Apply in the morning and remove at night. Leg measurement provided to patient. Electronic Signature(s) Signed: 08/30/2020 8:35:52 PM By: Barbara Najjar MD Signed: 08/31/2020 5:38:08 PM By: Fonnie Mu RN Entered By: Fonnie Mu on 08/30/2020 13:10:28 -------------------------------------------------------------------------------- Problem List Details Patient Name: Date of Service: Barbara Lucero B. 08/30/2020 1:00 PM Medical Record Number: [id number] Patient Account Number: [id number] Date of Birth/Sex: Treating RN: 1940-09-14 (80 y.o. Barbara Lucero, Barbara Lucero Primary Care Provider: Nita Lucero Other Clinician: Referring Provider: Treating Provider/Extender: Barbara Lucero in Treatment: 1 Active Problems ICD-10 Encounter Code Description Active Date MDM Diagnosis I87.331 Chronic venous hypertension (idiopathic) with ulcer and inflammation of right 08/23/2020 No Yes lower extremity I89.0 Lymphedema, not elsewhere classified 08/23/2020 No Yes L97.811 Non-pressure chronic ulcer of other part of right lower leg limited to breakdown 08/23/2020 No Yes of skin Inactive Problems Resolved Problems Electronic Signature(s) Signed: 08/30/2020 8:35:52 PM By: Barbara Najjar MD Entered By: Barbara Lucero on 08/30/2020  13:14:26 -------------------------------------------------------------------------------- Progress Note Details Patient Name: Date of Service: Barbara Lucero B. 08/30/2020 1:00 PM Medical Record Number: [id number] Patient Account Number: [id number] Date of Birth/Sex: Treating RN: August 17, 1940 (80 y.o. Barbara Lucero Primary Care Provider: Nita Lucero Other Clinician: Referring Provider: Treating Provider/Extender: Barbara Lucero in Treatment: 1 Subjective History of Present Illness (HPI) ADMISSION 08/23/2020 This is a 80 year old woman who arrives accompanied by her sister-in-law. 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 She has been dealing with chronic edema and discoloration of her legs for several years per her sister-in-law. I note that she saw Dr. Myra Lucero on 05/21/2020 and he noted an extensive left leg DVT in 2019 although she is no longer on oral anticoagulants. He felt she had chronic venous insufficiency with skin changes related to this. He ordered her 20/30 stockings which were zippered stockings but I do not get the sense that the patient was all that compliant. In any case she started to develop weeping edema fluid recently coming out of the right lateral ankle serous drainage. She said it was pouring out last week. She saw her primary care provider on 08/08/2020 and was put on clindamycin which she is just finishing. She says the erythema feels better. Past medical history, aortic stenosis, chronic venous insufficiency, peripheral vascular disease, Crohn's disease which has been really recently quiescent, left hip fracture, DVT of the left leg in 2019. Notable that she has a very disabled son at home who she is the primary caregiver for Dr. Randie Lucero quoted an ABI of 1.12 on the right with a TBI of 0.65 on the left and 0.93 and a TBI of 0.58. In our clinic today the ABI was 0.83 on the right and 0.81 on the left 7/7 the patient's wound on the right lateral ankle is  healed. Her edema control is much better. It turns out she does not have stockings but came in with Tubigrip on the left leg Objective Constitutional Patient  is hypertensive.. Pulse regular and within target range for patient.Marland Kitchen Respirations regular, non-labored and within target range.. Temperature is normal and within the target range for the patient.Marland Kitchen Appears in no distress. Vitals Time Taken: 12:57 PM, Height: 63 in, Weight: 182 lbs, BMI: 32.2, Temperature: 97.9 F, Pulse: 73 bpm, Respiratory Rate: 17 breaths/min, Blood Pressure: 171/85 mmHg. Cardiovascular Pedal pulses are palpable on the right. General Notes: Wound exam; although leaking areas here are have resolved. Much better edema control with our wraps. No evidence of cellulitis Integumentary (Hair, Skin) Wound #1 status is Healed - Epithelialized. Original cause of wound was Trauma. The date acquired was: 07/09/2020. The wound has been in treatment 1 weeks. The wound is located on the Right,Lateral Ankle. The wound measures 0cm length x 0cm width x 0cm depth; 0cm^2 area and 0cm^3 volume. Assessment Active Problems ICD-10 Chronic venous hypertension (idiopathic) with ulcer and inflammation of right lower extremity Lymphedema, not elsewhere classified Non-pressure chronic ulcer of other part of right lower leg limited to breakdown of skin Plan Discharge From [facility]: Discharge from Wound Care Center - Call if any future wound care needs. Edema Control - Lymphedema / SCD / Other: Elevate legs to the level of the heart or above for 30 minutes daily and/or when sitting, a frequency of: - 3-4 times a day throughout the day. Avoid standing for long periods of time. Patient to wear own compression stockings every day. Exercise regularly Moisturize legs daily. - every night before bed. Compression stocking or Garment 20-30 mm/Hg pressure to: - Patient to purchase compression stockings. Apply in the morning and remove at night.  Leg measurement provided to patient. 1. We have measured the patient's leg and instructed her to call elastic therapy in  and have 20/30 below-knee stockings sent to her home. 2. We went over what a day looks like in somebody requiring compression stockings including activity, leg elevation and skin lubrication nightly at least. She tells me that she has Eucerin at home. 3. The patient is already been to see vein and vascular 4. If the 20/30 below-knee stockings are not successful in controlling this she is going to require external compression garments i.e. juxta lites or something similar. 5. Her compliance with stockings I think is questionable. We will see. 6. She can be discharged from the clinic Electronic Signature(s) Signed: 08/30/2020 8:35:52 PM By: Barbara Najjar MD Entered By: Barbara Lucero on 08/30/2020 13:17:32 -------------------------------------------------------------------------------- SuperBill Details Patient Name: Date of Service: Barbara Lucero 08/30/2020 Medical Record Number: [id number] Patient Account Number: [id number] Date of Birth/Sex: Treating RN: [birth year] (80 y.o. [name], [name] Primary Care Provider: Nita Lucero Other Clinician: Referring Provider: Treating Provider/Extender: Barbara Lucero in Treatment: 1 Diagnosis Coding ICD-10 Codes Code Description [id number] Chronic venous hypertension (idiopathic) with ulcer and inflammation of right lower extremity I89.0 Lymphedema, not elsewhere classified L97.811 Non-pressure chronic ulcer of other part of right lower leg limited to breakdown of skin Facility Procedures CPT4 Code: [id number] Description: 99213 - WOUND CARE VISIT-LEV 3 EST PT Modifier: Quantity: 1 Physician Procedures : CPT4 Code Description Modifier [id number] - WC PHYS LEVEL 3 - EST PT 1 ICD-10 Diagnosis Description I87.331 Chronic venous hypertension (idiopathic) with ulcer and inflammation of right lower  extremity I89.0 Lymphedema, not elsewhere classified  L97.811 Non-pressure chronic ulcer of other part of right lower leg limited to breakdown of skin Quantity: Electronic Signature(s) Signed: 08/30/2020 8:35:52 PM By: Barbara Najjar MD Signed: 09/04/2020 5:43:47 PM By: Zandra Abts  RN, BSN Entered By: Zandra Abts on 08/30/2020 18:17:17

## 2020-09-04 NOTE — Progress Notes (Signed)
Barbara, Lucero ([id number]) Visit Report for 08/30/2020 Arrival Information Details Patient Name: Date of Service: Barbara Lucero 08/30/2020 1:00 PM Medical Record Number: [id number] Patient Account Number: [id number] Date of Birth/Sex: Treating RN: January 18, 1941 (80 y.o. Barbara Lucero, Barbara Primary Care Danyiel Crespin: [name] Other Clinician: Referring Betina Puckett: Treating Rubie Ficco/Extender: [name] in Treatment: 1 Visit Information History Since Last Visit Added or deleted any medications: No Patient Arrived: [name] Any new allergies or adverse reactions: No Arrival Time: 12:57 Had a fall or experienced change in No Accompanied By: family activities of daily living that may affect Transfer Assistance: None risk of falls: Patient Identification Verified: Yes Signs or symptoms of abuse/neglect since last visito No Secondary Verification Process Completed: Yes Hospitalized since last visit: No Patient Requires Transmission-Based Precautions: No Implantable device outside of the clinic excluding No Patient Has Alerts: No cellular tissue based products placed in the center since last visit: Has Dressing in Place as Prescribed: Yes Pain Present Now: No Electronic Signature(s) Signed: 08/31/2020 5:38:08 PM By: Fonnie Mu RN Entered By: Fonnie Mu on 08/30/2020 12:57:31 -------------------------------------------------------------------------------- Clinic Level of Care Assessment Details Patient Name: Date of Service: Barbara Lucero 08/30/2020 1:00 PM Medical Record Number: [id number] Patient Account Number: [id number] Date of Birth/Sex: Treating RN: [birth year] (80 y.o. Wynelle Lucero Primary Care Vernon Ariel: [name] Other Clinician: Referring Tamzin Bertling: Treating Lya Holben/Extender: [name] in Treatment: 1 Clinic Level of Care Assessment Items TOOL 4 Quantity Score X- 1 0 Use when only an EandM is performed on  FOLLOW-UP visit ASSESSMENTS - Nursing Assessment / Reassessment X- 1 10 Reassessment of Co-morbidities (includes updates in patient status) X- 1 5 Reassessment of Adherence to Treatment Plan ASSESSMENTS - Wound and Skin A ssessment / Reassessment X - Simple Wound Assessment / Reassessment - one wound 1 5 []  - 0 Complex Wound Assessment / Reassessment - multiple wounds []  - 0 Dermatologic / Skin Assessment (not related to wound area) ASSESSMENTS - Focused Assessment []  - 0 Circumferential Edema Measurements - multi extremities []  - 0 Nutritional Assessment / Counseling / Intervention X- 1 5 Lower Extremity Assessment (monofilament, tuning fork, pulses) []  - 0 Peripheral Arterial Disease Assessment (using hand held doppler) ASSESSMENTS - Ostomy and/or Continence Assessment and Care []  - 0 Incontinence Assessment and Management []  - 0 Ostomy Care Assessment and Management (repouching, etc.) PROCESS - Coordination of Care X - Simple Patient / Family Education for ongoing care 1 15 []  - 0 Complex (extensive) Patient / Family Education for ongoing care X- 1 10 Staff obtains , Records, T Results / Process Orders est []  - 0 Staff telephones HHA, Nursing Homes / Clarify orders / etc []  - 0 Routine Transfer to another Facility (non-emergent condition) []  - 0 Routine Hospital Admission (non-emergent condition) []  - 0 New Admissions / / Ordering NPWT Apligraf, etc. , []  - 0 Emergency Hospital Admission (emergent condition) X- 1 10 Simple Discharge Coordination []  - 0 Complex (extensive) Discharge Coordination PROCESS - Special Needs []  - 0 Pediatric / Minor Patient Management []  - 0 Isolation Patient Management []  - 0 Hearing / Language / Visual special needs []  - 0 Assessment of Community assistance (transportation, D/C planning, etc.) []  - 0 Additional assistance / Altered mentation []  - 0 Support Surface(s) Assessment (bed, cushion,  seat, etc.) INTERVENTIONS - Wound Cleansing / Measurement X - Simple Wound Cleansing - one wound 1 5 []  - 0 Complex Wound Cleansing - multiple  wounds X- 1 5 Wound Imaging (photographs - any number of wounds) []  - 0 Wound Tracing (instead of photographs) X- 1 5 Simple Wound Measurement - one wound []  - 0 Complex Wound Measurement - multiple wounds INTERVENTIONS - Wound Dressings []  - 0 Small Wound Dressing one or multiple wounds []  - 0 Medium Wound Dressing one or multiple wounds []  - 0 Large Wound Dressing one or multiple wounds []  - 0 Application of Medications - topical []  - 0 Application of Medications - injection INTERVENTIONS - Miscellaneous []  - 0 External ear exam []  - 0 Specimen Collection (cultures, biopsies, blood, body fluids, etc.) []  - 0 Specimen(s) / Culture(s) sent or taken to Lab for analysis []  - 0 Patient Transfer (multiple staff / / Similar devices) []  - 0 Simple Staple / Suture removal (25 or less) []  - 0 Complex Staple / Suture removal (26 or more) []  - 0 Hypo / Hyperglycemic Management (close monitor of Blood Glucose) []  - 0 Ankle / Brachial Index (ABI) - do not check if billed separately X- 1 5 Vital Signs Has the patient been seen at the hospital within the last three years: Yes Total Score: 80 Level Of Care: New/Established - Level 3 Electronic Signature(s) Signed: 09/04/2020 5:43:47 PM By: RN, BSN Entered By: on 08/30/2020 18:17:09 -------------------------------------------------------------------------------- Encounter Discharge Information Details Patient Name: Date of Service: B. 08/30/2020 1:00 PM Medical Record Number: Patient Account Number: Date of Birth/Sex: Treating RN: 30-Nov-1940 (80 y.o. Nurse, adult, Barbara Primary Care Tajae Lucero: Other Clinician: Referring Corianna Avallone: Treating Tierrah Anastos/Extender: in  Treatment: 1 Encounter Discharge Information Items Discharge Condition: Stable Ambulatory Status: Walker Discharge Destination: Home Transportation: Private Auto Accompanied By: family member Schedule Follow-up Appointment: No Clinical Summary of Care: Electronic Signature(s) Signed: 08/31/2020 5:38:08 PM By: RN Entered By: 11/05/2020 on 08/30/2020 13:11:41 -------------------------------------------------------------------------------- Lower Extremity Assessment Details Patient Name: Date of Service: Zandra Abts B. 08/30/2020 1:00 PM Medical Record Number: Esperanza Sheets Patient Account Number: 10/31/2020 Date of Birth/Sex: Treating RN: 23-Jan-1941 (80 y.o. 08/27/1940, Barbara Primary Care Ryin Schillo: 72 Other Clinician: Referring Rebakah Cokley: Treating Alesa Echevarria/Extender: Barbara Lucero in Treatment: 1 Edema Assessment Assessed: [name]: No] [name]: Yes] Edema: [Left: Yes] [Right: Yes] Calf Left: Right: Point of Measurement: From Medial Instep 35 cm Ankle Left: Right: Point of Measurement: From Medial Instep 20.8 cm Knee To Floor Left: Right: From Medial Instep 40 cm Vascular Assessment Pulses: Dorsalis Pedis Palpable: [Right:Yes] Posterior Tibial Palpable: [Right:Yes] Electronic Signature(s) Signed: 08/31/2020 5:38:08 PM By: Fonnie Mu RN Entered By: Fonnie Mu on 08/30/2020 13:11:05 -------------------------------------------------------------------------------- Multi Wound Chart Details Patient Name: Date of Service: [name] 08/30/2020 1:00 PM Medical Record Number: [id number] Patient Account Number: [id number] Date of Birth/Sex: Treating RN: 29-Jan-1941 (80 y.o. Barbara Lucero Primary Care Carollyn Etcheverry: [name] Other Clinician: Referring Ailene Royal: Treating Udell Mazzocco/Extender: [name] in Treatment: 1 Vital Signs Height(in): 63 Pulse(bpm): 73 Weight(lbs): 182 Blood  Pressure(mmHg): 171/85 Body Mass Index(BMI): 32 Temperature(F): 97.9 Respiratory Rate(breaths/min): 17 Photos: [1:No Photos Right, Lateral Ankle] [N/A:N/A N/A] Wound Location: [1:Trauma] [N/A:N/A] Wounding Event: [1:Venous Leg Ulcer] [N/A:N/A] Primary Etiology: [1:07/09/2020] [N/A:N/A] Date Acquired: [1:1] [N/A:N/A] Weeks of Treatment: [1:Healed - Epithelialized] [N/A:N/A] Wound Status: [1:0x0x0] [N/A:N/A] Measurements L x W x D (cm) [1:0] [N/A:N/A] A (cm) : rea [1:0] [N/A:N/A] Volume (cm) : [1:100.00%] [N/A:N/A] % Reduction in A rea: [1:100.00%] [  N/A:N/A] % Reduction in Volume: [1:Partial Thickness] [N/A:N/A] Treatment Notes Electronic Signature(s) Signed: 08/30/2020 6:24:11 PM By: Shawn Stall Signed: 08/30/2020 8:35:52 PM By: Baltazar Najjar MD Entered By: Baltazar Najjar on 08/30/2020 13:14:32 -------------------------------------------------------------------------------- Multi-Disciplinary Care Plan Details Patient Name: Date of Service: [name] 08/30/2020 1:00 PM Medical Record Number: [id number] Patient Account Number: [id number] Date of Birth/Sex: Treating RN: 01/25/1941 (80 y.o. Barbara Lucero, Barbara Primary Care Gayl Ivanoff: Other Clinician: [name] Referring Aishi Courts: Treating Harding Thomure/Extender: [name] in Treatment: 1 Active Inactive Electronic Signature(s) Signed: 08/31/2020 5:38:08 PM By: Fonnie Mu RN Entered By: Fonnie Mu on 08/30/2020 13:07:58 -------------------------------------------------------------------------------- Pain Assessment Details Patient Name: Date of Service: [name] 08/30/2020 1:00 PM Medical Record Number: [id number] Patient Account Number: [id number] Date of Birth/Sex: Treating RN: [birth year] (80 y.o. Barbara Lucero, Barbara Primary Care Damond Borchers: [name] Other Clinician: Referring Kinsley Holderman: Treating Agastya Meister/Extender: [name] in Treatment: 1 Active  Problems Location of Pain Severity and Description of Pain Patient Has Paino No Site Locations Pain Management and Medication Current Pain Management: Electronic Signature(s) Signed: 08/31/2020 5:38:08 PM By: Fonnie Mu RN Entered By: Fonnie Mu on 08/30/2020 12:57:54 -------------------------------------------------------------------------------- Patient/Caregiver Education Details Patient Name: Date of Service: Barbara Lucero 7/7/2022andnbsp1:00 PM Medical Record Number: [id number] Patient Account Number: [id number] Date of Birth/Gender: Treating RN: May 13, 1940 (80 y.o. Barbara Lucero, Barbara Primary Care Physician: [name] Other Clinician: Referring Physician: Treating Physician/Extender: [name] in Treatment: 1 Education Assessment Education Provided To: Patient Education Topics Provided Wound/Skin Impairment: Handouts: Skin Care Do's and Dont's Methods: Explain/Verbal Responses: Reinforcements needed Electronic Signature(s) Signed: 08/31/2020 5:38:08 PM By: Fonnie Mu RN Entered By: Fonnie Mu on 08/30/2020 13:08:15 -------------------------------------------------------------------------------- Wound Assessment Details Patient Name: Date of Service: [name] 08/30/2020 1:00 PM Medical Record Number: [id number] Patient Account Number: [id number] Date of Birth/Sex: Treating RN: [birth year] (80 y.o. Barbara Lucero, Barbara Primary Care Rosemary Mossbarger: [name] Other Clinician: Referring Elina Streng: Treating Jaydon Soroka/Extender: [name] in Treatment: 1 Wound Status Wound Number: 1 Primary Etiology: Venous Leg Ulcer Wound Location: Right, Lateral Ankle Wound Status: Healed - Epithelialized Wounding Event: Trauma Date Acquired: 07/09/2020 Weeks Of Treatment: 1 Clustered Wound: No Wound Measurements Length: (cm) Width: (cm) Depth: (cm) Area: (cm) Volume: (cm) 0 % Reduction in Area:  100% 0 % Reduction in Volume: 100% 0 0 0 Wound Description Classification: Partial Thickness Electronic Signature(s) Signed: 08/31/2020 5:38:08 PM By: Fonnie Mu RN Entered By: Fonnie Mu on 08/30/2020 13:04:58 -------------------------------------------------------------------------------- Vitals Details Patient Name: Date of Service: [name] 08/30/2020 1:00 PM Medical Record Number: [id number] Patient Account Number: [id number] Date of Birth/Sex: Treating RN: 02/22/1941 (80 y.o. Barbara Lucero, Barbara Primary Care Aadhya Bustamante: Other Clinician: [name] Referring Wojciech Willetts: Treating Ural Acree/Extender: [name] in Treatment: 1 Vital Signs Time Taken: 12:57 Temperature (F): 97.9 Height (in): 63 Pulse (bpm): 73 Weight (lbs): 182 Respiratory Rate (breaths/min): 17 Body Mass Index (BMI): 32.2 Blood Pressure (mmHg): 171/85 Reference Range: 80 - 120 mg / dl Electronic Signature(s) Signed: 08/31/2020 5:38:08 PM By: Fonnie Mu RN Entered By: Fonnie Mu on 08/30/2020 12:57:49

## 2020-09-14 DIAGNOSIS — U071 COVID-19: Secondary | ICD-10-CM | POA: Diagnosis not present

## 2020-09-18 ENCOUNTER — Ambulatory Visit: Payer: Medicare Other | Admitting: Obstetrics & Gynecology

## 2020-09-25 ENCOUNTER — Ambulatory Visit: Payer: Medicare Other | Admitting: Obstetrics & Gynecology

## 2020-10-01 ENCOUNTER — Encounter (HOSPITAL_BASED_OUTPATIENT_CLINIC_OR_DEPARTMENT_OTHER): Payer: Medicare Other | Attending: Physician Assistant | Admitting: Physician Assistant

## 2020-10-01 ENCOUNTER — Other Ambulatory Visit: Payer: Self-pay

## 2020-10-01 DIAGNOSIS — I87331 Chronic venous hypertension (idiopathic) with ulcer and inflammation of right lower extremity: Secondary | ICD-10-CM | POA: Diagnosis not present

## 2020-10-01 DIAGNOSIS — L97512 Non-pressure chronic ulcer of other part of right foot with fat layer exposed: Secondary | ICD-10-CM | POA: Diagnosis not present

## 2020-10-01 DIAGNOSIS — I890 Lymphedema, not elsewhere classified: Secondary | ICD-10-CM | POA: Insufficient documentation

## 2020-10-01 DIAGNOSIS — I739 Peripheral vascular disease, unspecified: Secondary | ICD-10-CM | POA: Insufficient documentation

## 2020-10-01 DIAGNOSIS — L03115 Cellulitis of right lower limb: Secondary | ICD-10-CM | POA: Insufficient documentation

## 2020-10-01 DIAGNOSIS — I872 Venous insufficiency (chronic) (peripheral): Secondary | ICD-10-CM | POA: Insufficient documentation

## 2020-10-01 NOTE — Progress Notes (Addendum)
JAKELINE, DAVE ([id number]) Visit Report for 10/01/2020 Chief Complaint Document Details Patient Name: Date of Service: Barbara Lucero 10/01/2020 1:15 PM Medical Record Number: [id number] Patient Account Number: [id number] Date of Birth/Sex: Treating RN: [birth year] (80 y.o. Barbara Lucero Primary Care Provider: [name] Other Clinician: Referring Provider: Treating Provider/Extender: Maryan Puls in Treatment: 5 Information Obtained from: Patient Chief Complaint 08/23/2020; this is a patient here for a weeping area on her right lateral lower leg just below the lateral malleolus Electronic Signature(s) Signed: 10/01/2020 1:23:13 PM By: Lenda Kelp PA-C Entered By: Lenda Kelp on 10/01/2020 13:23:12 -------------------------------------------------------------------------------- Debridement Details Patient Name: Date of Service: Barbara Sheets B. 10/01/2020 1:15 PM Medical Record Number: [id number] Patient Account Number: [id number] Date of Birth/Sex: Treating RN: 02/10/1941 (80 y.o. Barbara Lucero Primary Care Provider: [name] Other Clinician: Referring Provider: Treating Provider/Extender: Maryan Puls in Treatment: 5 Debridement Performed for Assessment: Wound #2 Right,Dorsal Foot Performed By: Physician Lenda Kelp, PA Debridement Type: Debridement Severity of Tissue Pre Debridement: Fat layer exposed Level of Consciousness (Pre-procedure): Awake and Alert Pre-procedure Verification/Time Out Yes - 14:32 Taken: Start Time: 14:33 T Area Debrided (L x W): otal 7 (cm) x 4 (cm) = 28 (cm) Tissue and other material debrided: Non-Viable, Skin: Dermis Level: Skin/Dermis Debridement Description: Selective/Open Wound Instrument: Other : Gauze Bleeding: None End Time: 14:37 Response to Treatment: Procedure was tolerated well Level of Consciousness (Post- Awake and Alert procedure): Post Debridement Measurements of Total  Wound Length: (cm) 7 Width: (cm) 4 Depth: (cm) 0.1 Volume: (cm) 2.199 Character of Wound/Ulcer Post Debridement: Stable Severity of Tissue Post Debridement: Fat layer exposed Post Procedure Diagnosis Same as Pre-procedure Electronic Signature(s) Signed: 10/01/2020 5:53:47 PM By: Antonieta Iba Signed: 10/01/2020 6:00:42 PM By: Lenda Kelp PA-C Entered By: Antonieta Iba on 10/01/2020 14:37:37 -------------------------------------------------------------------------------- HPI Details Patient Name: Date of Service: Barbara Sheets B. 10/01/2020 1:15 PM Medical Record Number: [id number] Patient Account Number: [id number] Date of Birth/Sex: Treating RN: 01-Jan-1941 (80 y.o. Barbara Lucero Primary Care Provider: [name] Other Clinician: Referring Provider: Treating Provider/Extender: Maryan Puls in Treatment: 5 History of Present Illness HPI Description: ADMISSION 08/23/2020 This is a 80 year old woman who arrives accompanied by her sister-in-law. 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 She has been dealing with chronic edema and discoloration of her legs for several years per her sister-in-law. I note that she saw Dr. Myra Gianotti on 05/21/2020 and he noted an extensive left leg DVT in 2019 although she is no longer on oral anticoagulants. He felt she had chronic venous insufficiency with skin changes related to this. He ordered her 20/30 stockings which were zippered stockings but I do not get the sense that the patient was all that compliant. In any case she started to develop weeping edema fluid recently coming out of the right lateral ankle serous drainage. She said it was pouring out last week. She saw her primary care provider on 08/08/2020 and was put on clindamycin which she is just finishing. She says the erythema feels better. Past medical history, aortic stenosis, chronic venous insufficiency, peripheral vascular disease, Crohn's disease which has been  really recently quiescent, left hip fracture, DVT of the left leg in 2019. Notable that she has a very disabled son at home who she is the primary caregiver for Dr. Randie Heinz quoted an ABI of 1.12 on the right with  a TBI of 0.65 on the left and 0.93 and a TBI of 0.58. In our clinic today the ABI was 0.83 on the right and 0.81 on the left 7/7 the patient's wound on the right lateral ankle is healed. Her edema control is much better. It turns out she does not have stockings but came in with Tubigrip on the left leg Readmission: 10/01/2020 on evaluation today patient appears to be doing decently well in regard to her leg. Unfortunately she does have an opening which happened as a result of her not wearing the compression socks she had something on the leg which was okay not necessarily the best but unfortunately did not include anything for the foot which is the main issue that we are finding here. With that being said I discussed with the patient that she really needs to have something for both and I think ordering her juxta lite compression wrap would be the ideal thing to do. Electronic Signature(s) Signed: 10/01/2020 2:44:28 PM By: Lenda Kelp PA-C Entered By: Lenda Kelp on 10/01/2020 14:44:28 -------------------------------------------------------------------------------- Physical Exam Details Patient Name: Date of Service: Barbara Lucero 10/01/2020 1:15 PM Medical Record Number: [id number] Patient Account Number: [id number] Date of Birth/Sex: Treating RN: 10/15/1940 (80 y.o. Barbara Lucero Primary Care Provider: [name] Other Clinician: Referring Provider: Treating Provider/Extender: Maryan Puls in Treatment: 5 Constitutional patient is hypertensive.. pulse regular and within target range for patient.Marland Kitchen respirations regular, non-labored and within target range for patient.Marland Kitchen temperature within target range for patient.. Well-nourished and well-hydrated in no  acute distress. Eyes conjunctiva clear no eyelid edema noted. pupils equal round and reactive to light and accommodation. Ears, Nose, Mouth, and Throat no gross abnormality of ear auricles or external auditory canals. normal hearing noted during conversation. mucus membranes moist. Respiratory normal breathing without difficulty. Cardiovascular 2+ dorsalis pedis/posterior tibialis pulses. 1+ pitting edema of the bilateral lower extremities. Musculoskeletal normal gait and posture. no significant deformity or arthritic changes, no loss or range of motion, no clubbing. Psychiatric this patient is able to make decisions and demonstrates good insight into disease process. Alert and Oriented x 3. pleasant and cooperative. Notes Upon inspection patient's wound actually showed signs of fairly good granulation epithelization there does not appear to be anything significantly draining at this point. With that being said I do believe that she would benefit from going ahead and proceeding with a order of juxta lite compression wraps. For today were also going to rewrap her to make sure we keep everything under control and get her back where things need to be for when she does get the juxta lite wraps. She is in agreement with all of the above. Electronic Signature(s) Signed: 10/01/2020 2:45:13 PM By: Lenda Kelp PA-C Entered By: Lenda Kelp on 10/01/2020 14:45:13 -------------------------------------------------------------------------------- Physician Orders Details Patient Name: Date of Service: Barbara Lucero 10/01/2020 1:15 PM Medical Record Number: [id number] Patient Account Number: [id number] Date of Birth/Sex: Treating RN: 1940-06-29 (80 y.o. Barbara Lucero Primary Care Provider: [name] Other Clinician: Referring Provider: Treating Provider/Extender: Maryan Puls in Treatment: 5 Verbal / Phone Orders: No Diagnosis Coding ICD-10 Coding Code  Description [phone] Chronic venous hypertension (idiopathic) with ulcer and inflammation of right lower extremity I89.0 Lymphedema, not elsewhere classified L97.811 Non-pressure chronic ulcer of other part of right lower leg limited to breakdown of skin Follow-up Appointments Return Appointment in 1 week. Bathing/ Shower/ Hygiene May shower  with protection but do not get wound dressing(s) wet. Edema Control - Lymphedema / SCD / Other Elevate legs to the level of the heart or above for 30 minutes daily and/or when sitting, a frequency of: Avoid standing for long periods of time. Additional Orders / Instructions Follow Nutritious Diet Wound Treatment Wound #2 - Foot Wound Laterality: Dorsal, Right Cleanser: Soap and Water 1 x Per Week/15 Days Discharge Instructions: May shower and wash wound with dial antibacterial soap and water prior to dressing change. Cleanser: Wound Cleanser 1 x Per Week/15 Days Discharge Instructions: Cleanse the wound with wound cleanser prior to applying a clean dressing using gauze sponges, not tissue or cotton balls. Peri-Wound Care: Triamcinolone 15 (g) 1 x Per Week/15 Days Discharge Instructions: Use triamcinolone 15 (g) as directed Peri-Wound Care: Sween Lotion (Moisturizing lotion) 1 x Per Week/15 Days Discharge Instructions: Apply moisturizing lotion as directed Prim Dressing: KerraCel Ag Gelling Fiber Dressing, 4x5 in (silver alginate) 1 x Per Week/15 Days ary Discharge Instructions: Apply silver alginate to wound bed as instructed Secondary Dressing: Woven Gauze Sponge, Non-Sterile 4x4 in 1 x Per Week/15 Days Discharge Instructions: Apply over primary dressing as directed. Compression Wrap: ThreePress (3 layer compression wrap) 1 x Per Week/15 Days Discharge Instructions: Apply three layer compression as directed. Compression Stockings: Circaid Juxta Lite Compression Wrap (DME) Right Leg Compression Amount: 30-40 mmHG Discharge Instructions: Apply  Circaid Juxta Lite Compression Wrap daily as instructed. Apply first thing in the morning, remove at night before bed. Electronic Signature(s) Signed: 10/01/2020 5:53:47 PM By: Antonieta Iba Signed: 10/01/2020 6:00:42 PM By: Lenda Kelp PA-C Entered By: Antonieta Iba on 10/01/2020 14:41:58 -------------------------------------------------------------------------------- Problem List Details Patient Name: Date of Service: Barbara Sheets B. 10/01/2020 1:15 PM Medical Record Number: [id number] Patient Account Number: [id number] Date of Birth/Sex: Treating RN: 02-Feb-1941 (80 y.o. Barbara Lucero Primary Care Provider: [name] Other Clinician: Referring Provider: Treating Provider/Extender: Maryan Puls in Treatment: 5 Active Problems ICD-10 Encounter Code Description Active Date MDM Diagnosis I87.331 Chronic venous hypertension (idiopathic) with ulcer and inflammation of right 08/23/2020 No Yes lower extremity I89.0 Lymphedema, not elsewhere classified 08/23/2020 No Yes L97.811 Non-pressure chronic ulcer of other part of right lower leg limited to breakdown 08/23/2020 No Yes of skin Inactive Problems Resolved Problems Electronic Signature(s) Signed: 10/01/2020 2:30:44 PM By: Antonieta Iba Signed: 10/01/2020 6:00:42 PM By: Lenda Kelp PA-C Previous Signature: 10/01/2020 1:23:07 PM Version By: Lenda Kelp PA-C Entered By: Antonieta Iba on 10/01/2020 14:30:44 -------------------------------------------------------------------------------- Progress Note Details Patient Name: Date of Service: Barbara Sheets B. 10/01/2020 1:15 PM Medical Record Number: [id number] Patient Account Number: [id number] Date of Birth/Sex: Treating RN: 05/15/1940 (80 y.o. Barbara Lucero Primary Care Provider: [name] Other Clinician: Referring Provider: Treating Provider/Extender: Maryan Puls in Treatment: 5 Subjective Chief Complaint Information obtained  from Patient 08/23/2020; this is a patient here for a weeping area on her right lateral lower leg just below the lateral malleolus History of Present Illness (HPI) ADMISSION 08/23/2020 This is a 80 year old woman who arrives accompanied by her sister-in-law. 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 She has been dealing with chronic edema and discoloration of her legs for several years per her sister-in-law. I note that she saw Dr. Myra Gianotti on 05/21/2020 and he noted an extensive left leg DVT in 2019 although she is no longer on oral anticoagulants. He felt she had chronic venous insufficiency with skin changes  related to this. He ordered her 20/30 stockings which were zippered stockings but I do not get the sense that the patient was all that compliant. In any case she started to develop weeping edema fluid recently coming out of the right lateral ankle serous drainage. She said it was pouring out last week. She saw her primary care provider on 08/08/2020 and was put on clindamycin which she is just finishing. She says the erythema feels better. Past medical history, aortic stenosis, chronic venous insufficiency, peripheral vascular disease, Crohn's disease which has been really recently quiescent, left hip fracture, DVT of the left leg in 2019. Notable that she has a very disabled son at home who she is the primary caregiver for Dr. Randie Heinzain quoted an ABI of 1.12 on the right with a TBI of 0.65 on the left and 0.93 and a TBI of 0.58. In our clinic today the ABI was 0.83 on the right and 0.81 on the left 7/7 the patient's wound on the right lateral ankle is healed. Her edema control is much better. It turns out she does not have stockings but came in with Tubigrip on the left leg Readmission: 10/01/2020 on evaluation today patient appears to be doing decently well in regard to her leg. Unfortunately she does have an opening which happened as a result of her not wearing the compression socks she had  something on the leg which was okay not necessarily the best but unfortunately did not include anything for the foot which is the main issue that we are finding here. With that being said I discussed with the patient that she really needs to have something for both and I think ordering her juxta lite compression wrap would be the ideal thing to do. Objective Constitutional patient is hypertensive.. pulse regular and within target range for patient.Marland Kitchen. respirations regular, non-labored and within target range for patient.Marland Kitchen. temperature within target range for patient.. Well-nourished and well-hydrated in no acute distress. Vitals Time Taken: 1:27 PM, Height: 63 in, Weight: 182 lbs, BMI: 32.2, Temperature: 97.9 F, Pulse: 73 bpm, Respiratory Rate: 17 breaths/min, Blood Pressure: 150/65 mmHg. Eyes conjunctiva clear no eyelid edema noted. pupils equal round and reactive to light and accommodation. Ears, Nose, Mouth, and Throat no gross abnormality of ear auricles or external auditory canals. normal hearing noted during conversation. mucus membranes moist. Respiratory normal breathing without difficulty. Cardiovascular 2+ dorsalis pedis/posterior tibialis pulses. 1+ pitting edema of the bilateral lower extremities. Musculoskeletal normal gait and posture. no significant deformity or arthritic changes, no loss or range of motion, no clubbing. Psychiatric this patient is able to make decisions and demonstrates good insight into disease process. Alert and Oriented x 3. pleasant and cooperative. General Notes: Upon inspection patient's wound actually showed signs of fairly good granulation epithelization there does not appear to be anything significantly draining at this point. With that being said I do believe that she would benefit from going ahead and proceeding with a order of juxta lite compression wraps. For today were also going to rewrap her to make sure we keep everything under control and get  her back where things need to be for when she does get the juxta lite wraps. She is in agreement with all of the above. Integumentary (Hair, Skin) Wound #2 status is Open. Original cause of wound was Shear/Friction. The date acquired was: 09/24/2020. The wound is located on the Right,Dorsal Foot. The wound measures 7cm length x 4cm width x 0.1cm depth; 21.991cm^2 area and 2.199cm^3 volume.  There is Fat Layer (Subcutaneous Tissue) exposed. There is no tunneling or undermining noted. There is a medium amount of serosanguineous drainage noted. The wound margin is distinct with the outline attached to the wound base. There is medium (34-66%) red, pink granulation within the wound bed. There is a medium (34-66%) amount of necrotic tissue within the wound bed including Adherent Slough. Assessment Active Problems ICD-10 Chronic venous hypertension (idiopathic) with ulcer and inflammation of right lower extremity Lymphedema, not elsewhere classified Non-pressure chronic ulcer of other part of right lower leg limited to breakdown of skin Procedures Wound #2 Pre-procedure diagnosis of Wound #2 is a Venous Leg Ulcer located on the Right,Dorsal Foot .Severity of Tissue Pre Debridement is: Fat layer exposed. There was a Selective/Open Wound Skin/Dermis Debridement with a total area of 28 sq cm performed by Lenda Kelp, PA. With the following instrument(s): Gauze to remove Non-Viable tissue/material. Material removed includes Skin: Dermis. No specimens were taken. A time out was conducted at 14:32, prior to the start of the procedure. There was no bleeding. The procedure was tolerated well. Post Debridement Measurements: 7cm length x 4cm width x 0.1cm depth; 2.199cm^3 volume. Character of Wound/Ulcer Post Debridement is stable. Severity of Tissue Post Debridement is: Fat layer exposed. Post procedure Diagnosis Wound #2: Same as Pre-Procedure Plan Follow-up Appointments: Return Appointment in 1  week. Bathing/ Shower/ Hygiene: May shower with protection but do not get wound dressing(s) wet. Edema Control - Lymphedema / SCD / Other: Elevate legs to the level of the heart or above for 30 minutes daily and/or when sitting, a frequency of: Avoid standing for long periods of time. Additional Orders / Instructions: Follow Nutritious Diet WOUND #2: - Foot Wound Laterality: Dorsal, Right Cleanser: Soap and Water 1 x Per Week/15 Days Discharge Instructions: May shower and wash wound with dial antibacterial soap and water prior to dressing change. Cleanser: Wound Cleanser 1 x Per Week/15 Days Discharge Instructions: Cleanse the wound with wound cleanser prior to applying a clean dressing using gauze sponges, not tissue or cotton balls. Peri-Wound Care: Triamcinolone 15 (g) 1 x Per Week/15 Days Discharge Instructions: Use triamcinolone 15 (g) as directed Peri-Wound Care: Sween Lotion (Moisturizing lotion) 1 x Per Week/15 Days Discharge Instructions: Apply moisturizing lotion as directed Prim Dressing: KerraCel Ag Gelling Fiber Dressing, 4x5 in (silver alginate) 1 x Per Week/15 Days ary Discharge Instructions: Apply silver alginate to wound bed as instructed Secondary Dressing: Woven Gauze Sponge, Non-Sterile 4x4 in 1 x Per Week/15 Days Discharge Instructions: Apply over primary dressing as directed. Com pression Wrap: ThreePress (3 layer compression wrap) 1 x Per Week/15 Days Discharge Instructions: Apply three layer compression as directed. Com pression Stockings: Circaid Juxta Lite Compression Wrap (DME) Compression Amount: 30-40 mmHg (right) Discharge Instructions: Apply Circaid Juxta Lite Compression Wrap daily as instructed. Apply first thing in the morning, remove at night before bed. 1. Would recommend him going continue with wound care measures as before and the patient is in agreement with plan. This includes the use of the 3 layer compression wrap which I think did well for her  before and I think it is a good option for this time as well. 2. Also use silver alginate to the wound bed to help with keeping everything nice and dry in the meantime. 3. I am also going to suggest the patient should be elevating her legs is much as possible and we are going to go ahead and see about ordering a juxta lite compression wrap  for the right leg. If she likes this she can always and then order 1 for her left leg as well which I think would be better than the wrap that she purchased from [place] that she has been wearing currently. We will see patient back for reevaluation in 1 week here in the clinic. If anything worsens or changes patient will contact our office for additional recommendations. Electronic Signature(s) Signed: 10/01/2020 2:46:19 PM By: Lenda Kelp PA-C Entered By: Lenda Kelp on 10/01/2020 14:46:19 -------------------------------------------------------------------------------- SuperBill Details Patient Name: Date of Service: Barbara Lucero 10/01/2020 Medical Record Number: [id number] Patient Account Number: [id number] Date of Birth/Sex: Treating RN: Aug 11, 1940 (80 y.o. Barbara Lucero Primary Care Provider: [name] Other Clinician: Referring Provider: Treating Provider/Extender: Maryan Puls in Treatment: 5 Diagnosis Coding ICD-10 Codes Code Description [phone] Chronic venous hypertension (idiopathic) with ulcer and inflammation of right lower extremity I89.0 Lymphedema, not elsewhere classified L97.811 Non-pressure chronic ulcer of other part of right lower leg limited to breakdown of skin Physician Procedures : CPT4 Code Description Modifier [id number] - WC PHYS LEVEL 4 - EST PT 25 ICD-10 Diagnosis Description I87.331 Chronic venous hypertension (idiopathic) with ulcer and inflammation of right lower extremity I89.0 Lymphedema, not elsewhere classified  L97.811 Non-pressure chronic ulcer of other part of right lower leg  limited to breakdown of skin Quantity: 1 Electronic Signature(s) Signed: 10/01/2020 2:46:42 PM By: Lenda Kelp PA-C Entered By: Lenda Kelp on 10/01/2020 14:46:42

## 2020-10-02 ENCOUNTER — Encounter: Payer: Self-pay | Admitting: Obstetrics & Gynecology

## 2020-10-02 ENCOUNTER — Ambulatory Visit: Payer: Medicare Other | Admitting: Obstetrics & Gynecology

## 2020-10-02 VITALS — BP 147/74 | HR 82 | Ht 64.0 in

## 2020-10-02 DIAGNOSIS — Z4689 Encounter for fitting and adjustment of other specified devices: Secondary | ICD-10-CM

## 2020-10-02 DIAGNOSIS — N3281 Overactive bladder: Secondary | ICD-10-CM

## 2020-10-02 DIAGNOSIS — L309 Dermatitis, unspecified: Secondary | ICD-10-CM

## 2020-10-02 DIAGNOSIS — N813 Complete uterovaginal prolapse: Secondary | ICD-10-CM

## 2020-10-02 MED ORDER — GERHARDT'S BUTT CREAM: 1.0000 "application " | TOPICAL_CREAM | Freq: Three times a day (TID) | CUTANEOUS | 11 refills | Status: DC

## 2020-10-02 NOTE — Progress Notes (Signed)
Chief Complaint  Patient presents with   Pessary Check    Blood pressure (!) 147/74, pulse 82, height 5\' 4"  (1.626 m).  Barbara Lucero presents today for routine follow up related to her pessary.   She uses a Milex ring with support #5 She reports no vaginal discharge and no vaginal bleeding   Likert scale(1 not bothersome -5 very bothersome)  :  1  Exam reveals no undue vaginal mucosal pressure of breakdown, no discharge and no vaginal bleeding.  She does have an area in her left gluteal fold no boil but scaly skin erythematous  Meds ordered this encounter  Medications   Nystatin (GERHARDT'S BUTT CREAM) CREA    Sig: Apply 1 application topically 3 (three) times daily.    Dispense:  1 each    Refill:  11     Vaginal Epithelial Abnormality Classification System:   0 0    No abnormalities 1    Epithelial erythema 2    Granulation tissue 3    Epithelial break or erosion, 1 cm or less 4    Epithelial break or erosion, 1 cm or greater  The pessary is removed, cleaned and replaced without difficulty.      ICD-10-CM   1. 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 OAB (overactive bladder): chronic + suboptimally managed  N32.81        Barbara Lucero will be sen back in 4 months for continued follow up.  Elias Else, MD  10/02/2020 11:26 AM

## 2020-10-03 DIAGNOSIS — L97811 Non-pressure chronic ulcer of other part of right lower leg limited to breakdown of skin: Secondary | ICD-10-CM | POA: Diagnosis not present

## 2020-10-03 DIAGNOSIS — I87331 Chronic venous hypertension (idiopathic) with ulcer and inflammation of right lower extremity: Secondary | ICD-10-CM | POA: Diagnosis not present

## 2020-10-09 ENCOUNTER — Encounter (HOSPITAL_BASED_OUTPATIENT_CLINIC_OR_DEPARTMENT_OTHER): Payer: Medicare Other | Admitting: Internal Medicine

## 2020-10-09 ENCOUNTER — Other Ambulatory Visit: Payer: Self-pay

## 2020-10-09 DIAGNOSIS — I872 Venous insufficiency (chronic) (peripheral): Secondary | ICD-10-CM | POA: Diagnosis not present

## 2020-10-09 DIAGNOSIS — I739 Peripheral vascular disease, unspecified: Secondary | ICD-10-CM | POA: Diagnosis not present

## 2020-10-09 DIAGNOSIS — L03115 Cellulitis of right lower limb: Secondary | ICD-10-CM | POA: Diagnosis not present

## 2020-10-09 DIAGNOSIS — I890 Lymphedema, not elsewhere classified: Secondary | ICD-10-CM | POA: Diagnosis not present

## 2020-10-09 DIAGNOSIS — L97512 Non-pressure chronic ulcer of other part of right foot with fat layer exposed: Secondary | ICD-10-CM | POA: Diagnosis not present

## 2020-10-09 DIAGNOSIS — I87331 Chronic venous hypertension (idiopathic) with ulcer and inflammation of right lower extremity: Secondary | ICD-10-CM | POA: Diagnosis not present

## 2020-10-09 NOTE — Progress Notes (Signed)
Barbara Lucero, Barbara Lucero ([id number]) Visit Report for 10/09/2020 HPI Details Patient Name: Date of Service: Barbara Lucero 10/09/2020 1:45 PM Medical Record Number: [id number] Patient Account Number: [id number] Date of Birth/Sex: Treating RN: 02-23-1941 (80 y.o. Barbara Lucero Primary Care Provider: Nita Lucero Other Clinician: Referring Provider: Treating Provider/Extender: Barbara Lucero in Treatment: 6 History of Present Illness HPI Description: ADMISSION 08/23/2020 This is a 80 year old woman who arrives accompanied by her sister-in-law. 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 She has been dealing with chronic edema and discoloration of her legs for several years per her sister-in-law. I note that she saw Barbara Lucero on 05/21/2020 and he noted an extensive left leg DVT in 2019 although she is no longer on oral anticoagulants. He felt she had chronic venous insufficiency with skin changes related to this. He ordered her 20/30 stockings which were zippered stockings but I do not get the sense that the patient was all that compliant. In any case she started to develop weeping edema fluid recently coming out of the right lateral ankle serous drainage. She said it was pouring out last week. She saw her primary care provider on 08/08/2020 and was put on clindamycin which she is just finishing. She says the erythema feels better. Past medical history, aortic stenosis, chronic venous insufficiency, peripheral vascular disease, Crohn's disease which has been really recently quiescent, left hip fracture, DVT of the left leg in 2019. Notable that she has a very disabled son at home who she is the primary caregiver for Dr. Randie Lucero quoted an ABI of 1.12 on the right with a TBI of 0.65 on the left and 0.93 and a TBI of 0.58. In our clinic today the ABI was 0.83 on the right and 0.81 on the left 7/7 the patient's wound on the right lateral ankle is healed. Her edema control is much better. It  turns out she does not have stockings but came in with Tubigrip on the left leg Readmission: 10/01/2020 on evaluation today patient appears to be doing decently well in regard to her leg. Unfortunately she does have an opening which happened as a result of her not wearing the compression socks she had something on the leg which was okay not necessarily the best but unfortunately did not include anything for the foot which is the main issue that we are finding here. With that being said I discussed with the patient that she really needs to have something for both and I think ordering her juxta lite compression wrap would be the ideal thing to do. 10/09/2020; patient I last saw in early July. At that point she had an area on the right lateral ankle which closed over. I am not really sure what happened however she is readmitted to the clinic last week. She had an area on her right lateral ankle this is closed however she has another area on the right lateral foot also quite a bit of discomfort on the right anterior foot. She has nothing open on the left leg. She comes in today with a single juxta lite stocking. She has a mid calf compression stocking she bought off [employer] on the Barbara Lucero: 10/09/2020 4:53:39 PM By: Barbara Najjar MD Entered By: Barbara Lucero on 10/09/2020 14:33:56 -------------------------------------------------------------------------------- Physical Exam Details Patient Name: Date of Service: Barbara Sheets B. 10/09/2020 1:45 PM Medical Record Number: [id number] Patient Account Number: [id number] Date of Birth/Sex: Treating RN: December 09, 1940 (80 y.o.  Barbara Lucero Primary Care Provider: Nita Lucero Other Clinician: Referring Provider: Treating Provider/Extender: Barbara Lucero in Treatment: 6 Constitutional Patient is hypertensive.. Pulse regular and within target range for patient.Marland Kitchen Respirations regular, non-labored and within target  range.. Temperature is normal and within the target range for the patient.Marland Kitchen Appears in no distress. Respiratory work of breathing is normal. Cardiovascular Pedal pulses are palpable. Notes Wound exam; the patient has a superficial area in the right dorsal foot there is erythema here and tenderness which extends into the lateral ankle above the lateral malleolus. Very tender. Patient has severe venous hypertension with dilated venules in her medial feet. Tight leathery skin in the right leg Electronic Signature(s) Lucero: 10/09/2020 4:53:39 PM By: Barbara Najjar MD Entered By: Barbara Lucero on 10/09/2020 14:40:13 -------------------------------------------------------------------------------- Physician Orders Details Patient Name: Date of Service: Barbara Sheets B. 10/09/2020 1:45 PM Medical Record Number: [id number] Patient Account Number: [id number] Date of Birth/Sex: Treating RN: January 03, 1941 (80 y.o. Barbara Lucero, Barbara Lucero Primary Care Provider: Nita Lucero Other Clinician: Referring Provider: Treating Provider/Extender: Barbara Lucero in Treatment: 6 Verbal / Phone Orders: No Diagnosis Coding Follow-up Appointments Return Appointment in 1 week. Bathing/ Shower/ Hygiene May shower with protection but do not get wound dressing(s) wet. Edema Control - Lymphedema / SCD / Other Elevate legs to the level of the heart or above for 30 minutes daily and/or when sitting, a frequency of: Avoid standing for long periods of time. Additional Orders / Instructions Follow Nutritious Diet Wound Treatment Wound #2 - Foot Wound Laterality: Dorsal, Right Cleanser: Soap and Water 1 x Per Week/15 Days Discharge Instructions: May shower and wash wound with dial antibacterial soap and water prior to dressing change. Cleanser: Wound Cleanser 1 x Per Week/15 Days Discharge Instructions: Cleanse the wound with wound cleanser prior to applying a clean dressing using gauze sponges, not  tissue or cotton balls. Peri-Wound Care: Triamcinolone 15 (g) 1 x Per Week/15 Days Discharge Instructions: Use triamcinolone 15 (g) as directed Peri-Wound Care: Sween Lotion (Moisturizing lotion) 1 x Per Week/15 Days Discharge Instructions: Apply moisturizing lotion as directed Prim Dressing: KerraCel Ag Gelling Fiber Dressing, 4x5 in (silver alginate) 1 x Per Week/15 Days ary Discharge Instructions: Apply silver alginate to wound bed as instructed Secondary Dressing: Woven Gauze Sponge, Non-Sterile 4x4 in 1 x Per Week/15 Days Discharge Instructions: Apply over primary dressing as directed. Compression Wrap: ThreePress (3 layer compression wrap) 1 x Per Week/15 Days Discharge Instructions: Apply three layer compression as directed. Compression Stockings: Circaid Juxta Lite Compression Wrap Right Leg Compression Amount: 30-40 mmHG Discharge Instructions: Apply Circaid Juxta Lite Compression Wrap daily as instructed. Apply first thing in the morning, remove at night before bed. Services and Therapies Venous Studies -Bilateral - venous reflux icd10: Patient Medications llergies: coconut, Latex, Natural Rubber A Notifications Medication Indication Start End cellulitis right foot 10/09/2020 cephalexin DOSE oral 500 mg capsule - 1 capsule oral q6h for 7 days Electronic Signature(s) Lucero: 10/09/2020 2:20:13 PM By: Barbara Najjar MD Entered By: Barbara Lucero on 10/09/2020 14:20:12 -------------------------------------------------------------------------------- Problem List Details Patient Name: Date of Service: Barbara Sheets B. 10/09/2020 1:45 PM Medical Record Number: [id number] Patient Account Number: [id number] Date of Birth/Sex: Treating RN: December 05, 1940 (80 y.o. Barbara Lucero Primary Care Provider: Nita Lucero Other Clinician: Referring Provider: Treating Provider/Extender: Barbara Lucero in Treatment: 6 Active Problems ICD-10 Encounter Code Description  Active Date MDM Diagnosis I87.331 Chronic venous hypertension (idiopathic) with ulcer and inflammation  of right 08/23/2020 No Yes lower extremity I89.0 Lymphedema, not elsewhere classified 08/23/2020 No Yes L97.811 Non-pressure chronic ulcer of other part of right lower leg limited to breakdown 08/23/2020 No Yes of skin L03.115 Cellulitis of right lower limb 10/09/2020 No Yes Inactive Problems Resolved Problems Electronic Signature(s) Lucero: 10/09/2020 4:53:39 PM By: Barbara Najjar MD Entered By: Barbara Lucero on 10/09/2020 14:30:42 -------------------------------------------------------------------------------- Progress Note Details Patient Name: Date of Service: Barbara Sheets B. 10/09/2020 1:45 PM Medical Record Number: [id number] Patient Account Number: [id number] Date of Birth/Sex: Treating RN: [birth year] (80 y.o. Barbara Lucero Primary Care Provider: Nita Lucero Other Clinician: Referring Provider: Treating Provider/Extender: Barbara Lucero in Treatment: 6 Subjective History of Present Illness (HPI) ADMISSION 08/23/2020 This is a 80 year old woman who arrives accompanied by her sister-in-law. 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 She has been dealing with chronic edema and discoloration of her legs for several years per her sister-in-law. I note that she saw Barbara Lucero on 05/21/2020 and he noted an extensive left leg DVT in 2019 although she is no longer on oral anticoagulants. He felt she had chronic venous insufficiency with skin changes related to this. He ordered her 20/30 stockings which were zippered stockings but I do not get the sense that the patient was all that compliant. In any case she started to develop weeping edema fluid recently coming out of the right lateral ankle serous drainage. She said it was pouring out last week. She saw her primary care provider on 08/08/2020 and was put on clindamycin which she is just finishing. She says the  erythema feels better. Past medical history, aortic stenosis, chronic venous insufficiency, peripheral vascular disease, Crohn's disease which has been really recently quiescent, left hip fracture, DVT of the left leg in 2019. Notable that she has a very disabled son at home who she is the primary caregiver for Dr. Randie Lucero quoted an ABI of 1.12 on the right with a TBI of 0.65 on the left and 0.93 and a TBI of 0.58. In our clinic today the ABI was 0.83 on the right and 0.81 on the left 7/7 the patient's wound on the right lateral ankle is healed. Her edema control is much better. It turns out she does not have stockings but came in with Tubigrip on the left leg Readmission: 10/01/2020 on evaluation today patient appears to be doing decently well in regard to her leg. Unfortunately she does have an opening which happened as a result of her not wearing the compression socks she had something on the leg which was okay not necessarily the best but unfortunately did not include anything for the foot which is the main issue that we are finding here. With that being said I discussed with the patient that she really needs to have something for both and I think ordering her juxta lite compression wrap would be the ideal thing to do. 10/09/2020; patient I last saw in early July. At that point she had an area on the right lateral ankle which closed over. I am not really sure what happened however she is readmitted to the clinic last week. She had an area on her right lateral ankle this is closed however she has another area on the right lateral foot also quite a bit of discomfort on the right anterior foot. She has nothing open on the left leg. She comes in today with a single juxta lite stocking. She has a mid calf compression  stocking she bought off [employer] on the left Objective Constitutional Patient is hypertensive.. Pulse regular and within target range for patient.Marland Kitchen Respirations regular, non-labored and within  target range.. Temperature is normal and within the target range for the patient.Marland Kitchen Appears in no distress. Vitals Time Taken: 1:45 PM, Height: 63 in, Weight: 182 lbs, BMI: 32.2, Temperature: 97.8 F, Pulse: 76 bpm, Respiratory Rate: 17 breaths/min, Blood Pressure: 176/85 mmHg. Respiratory work of breathing is normal. Cardiovascular Pedal pulses are palpable. General Notes: Wound exam; the patient has a superficial area in the right dorsal foot there is erythema here and tenderness which extends into the lateral ankle above the lateral malleolus. Very tender. Patient has severe venous hypertension with dilated venules in her medial feet. Tight leathery skin in the right leg Integumentary (Hair, Skin) Wound #2 status is Open. Original cause of wound was Shear/Friction. The date acquired was: 09/24/2020. The wound has been in treatment 1 weeks. The wound is located on the Right,Dorsal Foot. The wound measures 3.5cm length x 2cm width x 0.1cm depth; 5.498cm^2 area and 0.55cm^3 volume. There is Fat Layer (Subcutaneous Tissue) exposed. There is no tunneling or undermining noted. There is a medium amount of serosanguineous drainage noted. The wound margin is distinct with the outline attached to the wound base. There is large (67-100%) red, pink granulation within the wound bed. There is a small (1-33%) amount of necrotic tissue within the wound bed including Adherent Slough. Assessment Active Problems ICD-10 Chronic venous hypertension (idiopathic) with ulcer and inflammation of right lower extremity Lymphedema, not elsewhere classified Non-pressure chronic ulcer of other part of right lower leg limited to breakdown of skin Cellulitis of right lower limb Procedures Wound #2 Pre-procedure diagnosis of Wound #2 is a Venous Leg Ulcer located on the Right,Dorsal Foot . There was a Three Layer Compression Therapy Procedure by Fonnie Mu, RN. Post procedure Diagnosis Wound #2: Same as  Pre-Procedure Plan Follow-up Appointments: Return Appointment in 1 week. Bathing/ Shower/ Hygiene: May shower with protection but do not get wound dressing(s) wet. Edema Control - Lymphedema / SCD / Other: Elevate legs to the level of the heart or above for 30 minutes daily and/or when sitting, a frequency of: Avoid standing for long periods of time. Additional Orders / Instructions: Follow Nutritious Diet Services and Therapies ordered were: Venous Studies -Bilateral - venous reflux icd10: The following medication(s) was prescribed: cephalexin oral 500 mg capsule 1 capsule oral q6h for 7 days for cellulitis right foot starting 10/09/2020 WOUND #2: - Foot Wound Laterality: Dorsal, Right Cleanser: Soap and Water 1 x Per Week/15 Days Discharge Instructions: May shower and wash wound with dial antibacterial soap and water prior to dressing change. Cleanser: Wound Cleanser 1 x Per Week/15 Days Discharge Instructions: Cleanse the wound with wound cleanser prior to applying a clean dressing using gauze sponges, not tissue or cotton balls. Peri-Wound Care: Triamcinolone 15 (g) 1 x Per Week/15 Days Discharge Instructions: Use triamcinolone 15 (g) as directed Peri-Wound Care: Sween Lotion (Moisturizing lotion) 1 x Per Week/15 Days Discharge Instructions: Apply moisturizing lotion as directed Prim Dressing: KerraCel Ag Gelling Fiber Dressing, 4x5 in (silver alginate) 1 x Per Week/15 Days ary Discharge Instructions: Apply silver alginate to wound bed as instructed Secondary Dressing: Woven Gauze Sponge, Non-Sterile 4x4 in 1 x Per Week/15 Days Discharge Instructions: Apply over primary dressing as directed. Com pression Wrap: ThreePress (3 layer compression wrap) 1 x Per Week/15 Days Discharge Instructions: Apply three layer compression as directed. Com pression Stockings: Circaid  Juxta Lite Compression Wrap Compression Amount: 30-40 mmHg (right) Discharge Instructions: Apply Circaid Juxta Lite  Compression Wrap daily as instructed. Apply first thing in the morning, remove at night before bed. 1. I think the patient will need venous reflux studies. She has a history of an extensive DVT in the right leg in 2019. She is already been seen by vein and vascular Dr. Myra GianottiBrabham. Arterial studies were adequate and venous studies were ordered but I do not think actually completed 2. We will apply silver alginate ABDs to the areas on the right lateral ankle and right dorsal foot. Liberal TCA to the inflamed areas. 3. I am going to give her empiric cephalexin 500 every 6. 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 We will order the venous reflux studies 5. I have asked her to call us should she have any undue pain or drainage or develops systemic symptoms 6. I were going to use the 1 juxta lite stocking she has on the left leg. She will need to order another 1 for the right leg in the eventuality this closes over Electronic Signature(s) Lucero: 10/09/2020 4:53:39 PM By: Barbara Najjarobson, Christeena Krogh MD Entered By: Barbara Najjarobson, Jaeven Wanzer on 10/09/2020 14:42:35 -------------------------------------------------------------------------------- SuperBill Details Patient Name: Date of Service: Barbara HangerWRA Y, LO UISE B. 10/09/2020 Medical Record Number: [id number] Patient Account Number: [id number] Date of Birth/Sex: Treating RN: 10/19/1940 (80 y.o. Barbara RowanF) Breedlove, Barbara Lucero Primary Care Provider: Nita SellsHall, John Other Clinician: Referring Provider: Treating Provider/Extender: Barbara Simsobson, Lakeia Bradshaw Hall, John Weeks in Treatment: 6 Diagnosis Coding ICD-10 Codes Code Description [phone] Chronic venous hypertension (idiopathic) with ulcer and inflammation of right lower extremity I89.0 Lymphedema, not elsewhere classified L97.811 Non-pressure chronic ulcer of other part of right lower leg limited to breakdown of skin L03.115 Cellulitis of right lower limb Facility Procedures CPT4 Code:  [id number] Description: (Facility Use Only) [id number] - APPLY MULTLAY COMPRS LWR RT LEG Modifier: Quantity: 1 Physician Procedures : CPT4 Code Description Modifier [id number] - WC PHYS LEVEL 4 - EST PT ICD-10 Diagnosis Description I87.331 Chronic venous hypertension (idiopathic) with ulcer and inflammation of right lower extremity I89.0 Lymphedema, not elsewhere classified  L97.811 Non-pressure chronic ulcer of other part of right lower leg limited to breakdown of skin L03.115 Cellulitis of right lower limb Quantity: 1 Electronic Signature(s) Lucero: 10/09/2020 4:53:39 PM By: Barbara Najjarobson, Richel Millspaugh MD Entered By: Barbara Najjarobson, Grayson Pfefferle on 10/09/2020 14:43:51

## 2020-10-09 NOTE — Progress Notes (Signed)
Barbara Lucero, Barbara Lucero ([id number]) Visit Report for 10/09/2020 Arrival Information Details Patient Name: Date of Service: Barbara Lucero 10/09/2020 1:45 PM Medical Record Number: [id number] Patient Account Number: [id number] Date of Birth/Sex: Treating RN: [birth year] (80 y.o. [name], [name] Primary Care Hagar Sadiq: Allyn Kenner Other Clinician: Referring Alette Kataoka: Treating Mcadoo Muzquiz/Extender: Adolm Joseph in Treatment: 6 Visit Information History Since Last Visit Added or deleted any medications: No Patient Arrived: Barbara Lucero Any new allergies or adverse reactions: No Arrival Time: 13:45 Had a fall or experienced change in No Accompanied By: daughter activities of daily living that may affect Transfer Assistance: None risk of falls: Patient Identification Verified: Yes Signs or symptoms of abuse/neglect since last visito No Secondary Verification Process Completed: Yes Hospitalized since last visit: No Patient Requires Transmission-Based Precautions: No Implantable device outside of the clinic excluding No Patient Has Alerts: No cellular tissue based products placed in the center since last visit: Has Dressing in Place as Prescribed: Yes Pain Present Now: Yes Electronic Signature(s) Signed: 10/09/2020 5:16:57 PM By: Rhae Hammock RN Entered By: Rhae Hammock on 10/09/2020 13:45:19 -------------------------------------------------------------------------------- Compression Therapy Details Patient Name: Date of Service: Barbara Cage B. 10/09/2020 1:45 PM Medical Record Number: [id number] Patient Account Number: [id number] Date of Birth/Sex: Treating RN: 21-May-1940 (80 y.o. [name], [name] Primary Care Idaliz Tinkle: Allyn Kenner Other Clinician: Referring Leora Platt: Treating Katye Valek/Extender: Adolm Joseph in Treatment: 6 Compression Therapy Performed for Wound Assessment: Wound #2 Right,Dorsal Foot Performed By: Clinician Rhae Hammock, RN Compression Type: Three Layer Post Procedure Diagnosis Same as Pre-procedure Electronic Signature(s) Signed: 10/09/2020 5:16:57 PM By: Rhae Hammock RN Entered By: Rhae Hammock on 10/09/2020 14:08:18 -------------------------------------------------------------------------------- Encounter Discharge Information Details Patient Name: Date of Service: Barbara Cage B. 10/09/2020 1:45 PM Medical Record Number: [id number] Patient Account Number: [id number] Date of Birth/Sex: Treating RN: [birth year] (80 y.o. [name], [name] Primary Care Cathleen Yagi: Allyn Kenner Other Clinician: Referring Gillian Meeuwsen: Treating Arne Schlender/Extender: Adolm Joseph in Treatment: 6 Encounter Discharge Information Items Discharge Condition: Stable Ambulatory Status: Walker Discharge Destination: Home Transportation: Private Auto Accompanied By: daughternlaw Schedule Follow-up Appointment: Yes Clinical Summary of Care: Patient Declined Electronic Signature(s) Signed: 10/09/2020 5:16:57 PM By: Rhae Hammock RN Entered By: Rhae Hammock on 10/09/2020 16:34:53 -------------------------------------------------------------------------------- Lower Extremity Assessment Details Patient Name: Date of Service: Barbara Cage B. 10/09/2020 1:45 PM Medical Record Number: [id number] Patient Account Number: [id number] Date of Birth/Sex: Treating RN: 1940-07-05 (80 y.o. [name], [name] Primary Care Perian Tedder: Allyn Kenner Other Clinician: Referring Claire Dolores: Treating Aiken Withem/Extender: Adolm Joseph in Treatment: 6 Edema Assessment Assessed: Barbara Lucero: No] [Right: Yes] Edema: [Left: Yes] [Right: Yes] Calf Left: Right: Point of Measurement: From Medial Instep 33 cm Ankle Left: Right: Point of Measurement: From Medial Instep 22 cm Vascular Assessment Pulses: Dorsalis Pedis Palpable: [Right:Yes] Posterior Tibial Palpable: [Right:Yes] Electronic  Signature(s) Signed: 10/09/2020 5:16:57 PM By: Rhae Hammock RN Entered By: Rhae Hammock on 10/09/2020 13:51:37 -------------------------------------------------------------------------------- Multi Wound Chart Details Patient Name: Date of Service: Barbara Cage B. 10/09/2020 1:45 PM Medical Record Number: [id number] Patient Account Number: [id number] Date of Birth/Sex: Treating RN: Feb 22, 1941 (80 y.o. [name] Primary Care Ashantia Amaral: Allyn Kenner Other Clinician: Referring Jacolby Risby: Treating Tyquavious Gamel/Extender: Adolm Joseph in Treatment: 6 Vital Signs Height(in): 63 Pulse(bpm): 62 Weight(lbs): 182 Blood Pressure(mmHg): 176/85 Body Mass Index(BMI): 32 Temperature(F): 97.8 Respiratory Rate(breaths/min): 17 Photos: [2:No Photos Right, Dorsal Foot] [N/A:N/A N/A] Wound Location: [2:Shear/Friction] [N/A:N/A] Wounding Event: [  2:Venous Leg Ulcer] [N/A:N/A] Primary Etiology: [2:Cataracts, Deep Vein Thrombosis,] [N/A:N/A] Comorbid History: [2:Peripheral Venous Disease, Crohns, Osteoarthritis, Confinement Anxiety 09/24/2020] [N/A:N/A] Date Acquired: [2:1] [N/A:N/A] Weeks of Treatment: [2:Open] [N/A:N/A] Wound Status: [2:3.5x2x0.1] [N/A:N/A] Measurements L x W x D (cm) [2:5.498] [N/A:N/A] A (cm) : rea [2:0.55] [N/A:N/A] Volume (cm) : [2:75.00%] [N/A:N/A] % Reduction in Area: [2:75.00%] [N/A:N/A] % Reduction in Volume: [2:Full Thickness Without Exposed] [N/A:N/A] Classification: [2:Support Structures Medium] [N/A:N/A] Exudate Amount: [2:Serosanguineous] [N/A:N/A] Exudate Type: [2:red, brown] [N/A:N/A] Exudate Color: [2:Distinct, outline attached] [N/A:N/A] Wound Margin: [2:Large (67-100%)] [N/A:N/A] Granulation Amount: [2:Red, Pink] [N/A:N/A] Granulation Quality: [2:Small (1-33%)] [N/A:N/A] Necrotic Amount: [2:Fat Layer (Subcutaneous Tissue): Yes N/A] Exposed Structures: [2:Fascia: No Tendon: No Muscle: No Joint: No Bone: No Small (1-33%)]  [N/A:N/A] Epithelialization: [2:Compression Therapy] [N/A:N/A] Treatment Notes Electronic Signature(s) Signed: 10/09/2020 4:53:39 PM By: Linton Ham MD Signed: 10/09/2020 5:19:53 PM By: Deon Pilling Entered By: Linton Ham on 10/09/2020 14:30:48 -------------------------------------------------------------------------------- Multi-Disciplinary Care Plan Details Patient Name: Date of Service: Barbara Cage B. 10/09/2020 1:45 PM Medical Record Number: [id number] Patient Account Number: [id number] Date of Birth/Sex: Treating RN: 27-Mar-1940 (80 y.o. [name], [name] Primary Care Marty Uy: Allyn Kenner Other Clinician: Referring Fabiola Mudgett: Treating Ozella Comins/Extender: Adolm Joseph in Treatment: 6 Active Inactive Wound/Skin Impairment Nursing Diagnoses: Knowledge deficit related to ulceration/compromised skin integrity Goals: Patient/caregiver will verbalize understanding of skin care regimen Date Initiated: 08/23/2020 Date Inactivated: 08/30/2020 Target Resolution Date: 08/31/2020 Goal Status: Met Ulcer/skin breakdown will have a volume reduction of 30% by week 4 Date Initiated: 10/01/2020 Target Resolution Date: 10/29/2020 Goal Status: Active Interventions: Assess patient/caregiver ability to obtain necessary supplies Assess patient/caregiver ability to perform ulcer/skin care regimen upon admission and as needed Provide education on ulcer and skin care Treatment Activities: Skin care regimen initiated : 08/23/2020 Topical wound management initiated : 08/23/2020 Notes: Electronic Signature(s) Signed: 10/09/2020 5:16:57 PM By: Rhae Hammock RN Entered By: Rhae Hammock on 10/09/2020 14:02:30 -------------------------------------------------------------------------------- Pain Assessment Details Patient Name: Date of Service: Barbara Cage B. 10/09/2020 1:45 PM Medical Record Number: [id number] Patient Account Number: [id number] Date of  Birth/Sex: Treating RN: [birth year] (80 y.o. [name], [name] Primary Care Cinderella Christoffersen: Allyn Kenner Other Clinician: Referring Mihail Prettyman: Treating Dalisa Forrer/Extender: Adolm Joseph in Treatment: 6 Active Problems Location of Pain Severity and Description of Pain Patient Has Paino Yes Site Locations Pain Location: Generalized Pain, Pain in Ulcers With Dressing Change: Yes Duration of the Pain. Constant / Intermittento Intermittent Rate the pain. Current Pain Level: 9 Worst Pain Level: 10 Least Pain Level: 0 Tolerable Pain Level: 9 Character of Pain Describe the Pain: Aching Pain Management and Medication Current Pain Management: Medication: No Cold Application: No Rest: No Massage: No Activity: No T.E.N.S.: No Heat Application: No Leg drop or elevation: No Is the Current Pain Management Adequate: Adequate How does your wound impact your activities of daily livingo Sleep: No Bathing: No Appetite: No Relationship With Others: No Bladder Continence: No Emotions: No Bowel Continence: No Work: No Toileting: No Drive: No Dressing: No Hobbies: No Electronic Signature(s) Signed: 10/09/2020 5:16:57 PM By: Rhae Hammock RN Entered By: Rhae Hammock on 10/09/2020 13:46:04 -------------------------------------------------------------------------------- Patient/Caregiver Education Details Patient Name: Date of Service: Barbara Lucero 8/16/2022andnbsp1:45 PM Medical Record Number: [id number] Patient Account Number: [id number] Date of Birth/Gender: Treating RN: 1940-09-24 (80 y.o. [name], [name] Primary Care Physician: Allyn Kenner Other Clinician: Referring Physician: Treating Physician/Extender: Adolm Joseph in Treatment: 6 Education Assessment Education Provided To: Patient Education  Topics Provided Wound/Skin Impairment: Methods: Explain/Verbal Responses: State content correctly Electronic Signature(s) Signed:  10/09/2020 5:16:57 PM By: Rhae Hammock RN Entered By: Rhae Hammock on 10/09/2020 14:02:54 -------------------------------------------------------------------------------- Wound Assessment Details Patient Name: Date of Service: Barbara Cage B. 10/09/2020 1:45 PM Medical Record Number: [id number] Patient Account Number: [id number] Date of Birth/Sex: Treating RN: 1940-04-16 (80 y.o. [name], [name] Primary Care Unknown Flannigan: Allyn Kenner Other Clinician: Referring Malkia Nippert: Treating Ashle Stief/Extender: Adolm Joseph in Treatment: 6 Wound Status Wound Number: 2 Primary Venous Leg Ulcer Etiology: Wound Location: Right, Dorsal Foot Wound Open Wounding Event: Shear/Friction Status: Date Acquired: 09/24/2020 Date Acquired: 09/24/2020 Comorbid Cataracts, Deep Vein Thrombosis, Peripheral Venous Disease, Weeks Of Treatment: 1 History: Crohns, Osteoarthritis, Confinement Anxiety Clustered Wound: No Wound Measurements Length: (cm) 3.5 Width: (cm) 2 Depth: (cm) 0.1 Area: (cm) 5.498 Volume: (cm) 0.55 % Reduction in Area: 75% % Reduction in Volume: 75% Epithelialization: Small (1-33%) Tunneling: No Undermining: No Wound Description Classification: Full Thickness Without Exposed Support Structures Wound Margin: Distinct, outline attached Exudate Amount: Medium Exudate Type: Serosanguineous Exudate Color: red, brown Foul Odor After Cleansing: No Slough/Fibrino No Wound Bed Granulation Amount: Large (67-100%) Exposed Structure Granulation Quality: Red, Pink Fascia Exposed: No Necrotic Amount: Small (1-33%) Fat Layer (Subcutaneous Tissue) Exposed: Yes Necrotic Quality: Adherent Slough Tendon Exposed: No Muscle Exposed: No Joint Exposed: No Bone Exposed: No Treatment Notes Wound #2 (Foot) Wound Laterality: Dorsal, Right Cleanser Soap and Water Discharge Instruction: May shower and wash wound with dial antibacterial soap and water prior to dressing  change. Wound Cleanser Discharge Instruction: Cleanse the wound with wound cleanser prior to applying a clean dressing using gauze sponges, not tissue or cotton balls. Peri-Wound Care Triamcinolone 15 (g) Discharge Instruction: Use triamcinolone 15 (g) as directed Sween Lotion (Moisturizing lotion) Discharge Instruction: Apply moisturizing lotion as directed Topical Primary Dressing KerraCel Ag Gelling Fiber Dressing, 4x5 in (silver alginate) Discharge Instruction: Apply silver alginate to wound bed as instructed Secondary Dressing Woven Gauze Sponge, Non-Sterile 4x4 in Discharge Instruction: Apply over primary dressing as directed. Secured With Compression Wrap ThreePress (3 layer compression wrap) Discharge Instruction: Apply three layer compression as directed. Compression Stockings Circaid Juxta Lite Compression Wrap Quantity: 1 Right Leg Compression Amount: 30-40 mmHg Discharge Instruction: Apply Circaid Juxta Lite Compression Wrap daily as instructed. Apply first thing in the morning, remove at night before bed. Add-Ons Electronic Signature(s) Signed: 10/09/2020 5:16:57 PM By: Rhae Hammock RN Entered By: Rhae Hammock on 10/09/2020 13:53:43 -------------------------------------------------------------------------------- Vitals Details Patient Name: Date of Service: Barbara Cage B. 10/09/2020 1:45 PM Medical Record Number: [id number] Patient Account Number: [id number] Date of Birth/Sex: Treating RN: [birth year] (80 y.o. [name], [name] Primary Care Terese Heier: Allyn Kenner Other Clinician: Referring Collyn Ribas: Treating Lauralie Blacksher/Extender: Adolm Joseph in Treatment: 6 Vital Signs Time Taken: 13:45 Temperature (F): 97.8 Height (in): 63 Pulse (bpm): 76 Weight (lbs): 182 Respiratory Rate (breaths/min): 17 Body Mass Index (BMI): 32.2 Blood Pressure (mmHg): 176/85 Reference Range: 80 - 120 mg / dl Electronic Signature(s) Signed:  10/09/2020 5:16:57 PM By: Rhae Hammock RN Entered By: Rhae Hammock on 10/09/2020 13:45:40

## 2020-10-09 NOTE — Progress Notes (Signed)
Barbara, Lucero ([id number]) Visit Report for 10/01/2020 Arrival Information Details Patient Name: Date of Service: Barbara Lucero 10/01/2020 1:15 PM Medical Record Number: [id number] Patient Account Number: [id number] Date of Birth/Sex: Treating RN: 10/20/1940 (80 y.o. Sue Lush Primary Care Ynez Eugenio: Allyn Kenner Other Clinician: Referring Iliza Blankenbeckler: Treating Daneen Volcy/Extender: Oretha Ellis in Treatment: 5 Visit Information History Since Last Visit Added or deleted any medications: No Patient Arrived: Ambulatory Any new allergies or adverse reactions: No Arrival Time: 13:26 Had a fall or experienced change in No Accompanied By: friend activities of daily living that may affect Transfer Assistance: None risk of falls: Patient Identification Verified: Yes Signs or symptoms of abuse/neglect since last visito No Secondary Verification Process Completed: Yes Hospitalized since last visit: No Patient Requires Transmission-Based Precautions: No Implantable device outside of the clinic excluding No Patient Has Alerts: No cellular tissue based products placed in the center since last visit: Has Dressing in Place as Prescribed: Yes Pain Present Now: No Electronic Signature(s) Signed: 10/09/2020 11:33:27 AM By: Sandre Kitty Entered By: Sandre Kitty on 10/01/2020 13:26:49 -------------------------------------------------------------------------------- Encounter Discharge Information Details Patient Name: Date of Service: Barbara Lucero B. 10/01/2020 1:15 PM Medical Record Number: [id number] Patient Account Number: [id number] Date of Birth/Sex: Treating RN: 1940-10-14 (80 y.o. [name] Primary Care Austine Kelsay: Allyn Kenner Other Clinician: Referring Vauda Salvucci: Treating Murray Guzzetta/Extender: Oretha Ellis in Treatment: 5 Encounter Discharge Information Items Post Procedure Vitals Discharge Condition: Stable Temperature (F):  97.9 Ambulatory Status: Walker Pulse (bpm): 73 Discharge Destination: Home Respiratory Rate (breaths/min): 18 Transportation: Private Auto Blood Pressure (mmHg): 150/65 Accompanied By: friend Schedule Follow-up Appointment: Yes Clinical Summary of Care: Patient Declined Electronic Signature(s) Signed: 10/02/2020 5:39:27 PM By: Baruch Gouty RN, BSN Entered By: Baruch Gouty on 10/01/2020 14:57:10 -------------------------------------------------------------------------------- Lower Extremity Assessment Details Patient Name: Date of Service: Barbara Lucero 10/01/2020 1:15 PM Medical Record Number: [id number] Patient Account Number: [id number] Date of Birth/Sex: Treating RN: August 17, 1940 (80 y.o. [name] Primary Care Zeyad Delaguila: Allyn Kenner Other Clinician: Referring [place] Souter: Treating Vincenza Dail/Extender: Oretha Ellis in Treatment: 5 Edema Assessment Assessed: [Left: No] [Right: Yes] Edema: [Left: Yes] [Right: Yes] Calf Left: Right: Point of Measurement: From Medial Instep 33 cm Ankle Left: Right: Point of Measurement: From Medial Instep 22 cm Knee To Floor Left: Right: From Medial Instep 39 cm Vascular Assessment Pulses: Dorsalis Pedis Palpable: [Right:Yes] Electronic Signature(s) Signed: 10/01/2020 5:35:12 PM By: Deon Pilling Signed: 10/01/2020 5:53:47 PM By: Lorrin Jackson Entered By: Lorrin Jackson on 10/01/2020 14:39:08 -------------------------------------------------------------------------------- Multi-Disciplinary Care Plan Details Patient Name: Date of Service: Barbara Lucero B. 10/01/2020 1:15 PM Medical Record Number: [id number] Patient Account Number: [id number] Date of Birth/Sex: Treating RN: [birth year] (80 y.o. Sue Lush Primary Care Carrie Usery: Allyn Kenner Other Clinician: Referring Yacoub Diltz: Treating Ilia Dimaano/Extender: Oretha Ellis in Treatment: 5 Active Inactive Wound/Skin Impairment Nursing  Diagnoses: Knowledge deficit related to ulceration/compromised skin integrity Goals: Patient/caregiver will verbalize understanding of skin care regimen Date Initiated: 08/23/2020 Date Inactivated: 08/30/2020 Target Resolution Date: 08/31/2020 Goal Status: Met Ulcer/skin breakdown will have a volume reduction of 30% by week 4 Date Initiated: 10/01/2020 Target Resolution Date: 10/29/2020 Goal Status: Active Interventions: Assess patient/caregiver ability to obtain necessary supplies Assess patient/caregiver ability to perform ulcer/skin care regimen upon admission and as needed Provide education on ulcer and skin care Treatment Activities: Skin care regimen initiated : 08/23/2020 Topical wound management initiated : 08/23/2020  Notes: Electronic Signature(s) Signed: 10/01/2020 5:53:47 PM By: Lorrin Jackson Entered By: Lorrin Jackson on 10/01/2020 13:26:32 -------------------------------------------------------------------------------- Pain Assessment Details Patient Name: Date of Service: Barbara Lucero 10/01/2020 1:15 PM Medical Record Number: [id number] Patient Account Number: [id number] Date of Birth/Sex: Treating RN: 1940/03/14 (80 y.o. Sue Lush Primary Care Anita Laguna: Allyn Kenner Other Clinician: Referring Markevius Trombetta: Treating Brylinn Teaney/Extender: Oretha Ellis in Treatment: 5 Active Problems Location of Pain Severity and Description of Pain Patient Has Paino No Site Locations Pain Management and Medication Current Pain Management: Electronic Signature(s) Signed: 10/01/2020 5:53:47 PM By: Lorrin Jackson Signed: 10/09/2020 11:33:27 AM By: Sandre Kitty Entered By: Sandre Kitty on 10/01/2020 13:27:05 -------------------------------------------------------------------------------- Patient/Caregiver Education Details Patient Name: Date of Service: Barbara Lucero 8/8/2022andnbsp1:15 PM Medical Record Number: [id number] Patient Account Number:  [id number] Date of Birth/Gender: Treating RN: 1941/02/16 (80 y.o. Sue Lush Primary Care Physician: Allyn Kenner Other Clinician: Referring Physician: Treating Physician/Extender: Oretha Ellis in Treatment: 5 Education Assessment Education Provided To: Patient Education Topics Provided Venous: Methods: Explain/Verbal, Printed Responses: State content correctly Wound/Skin Impairment: Methods: Explain/Verbal, Printed Responses: State content correctly Electronic Signature(s) Signed: 10/01/2020 5:53:47 PM By: Lorrin Jackson Entered By: Lorrin Jackson on 10/01/2020 13:27:06 -------------------------------------------------------------------------------- Wound Assessment Details Patient Name: Date of Service: Barbara Lucero 10/01/2020 1:15 PM Medical Record Number: [id number] Patient Account Number: [id number] Date of Birth/Sex: Treating RN: 1940-08-14 (80 y.o. Sue Lush Primary Care Geniene List: Allyn Kenner Other Clinician: Referring Salma Walrond: Treating Maekayla Giorgio/Extender: Oretha Ellis in Treatment: 5 Wound Status Wound Number: 2 Primary Venous Leg Ulcer Etiology: Wound Location: Right, Dorsal Foot Wound Open Wounding Event: Shear/Friction Status: Date Acquired: 09/24/2020 Comorbid Cataracts, Deep Vein Thrombosis, Peripheral Venous Disease, Weeks Of Treatment: 0 History: Crohns, Osteoarthritis, Confinement Anxiety Clustered Wound: No Photos Wound Measurements Length: (cm) 7 Width: (cm) 4 Depth: (cm) 0.1 Area: (cm) 21.991 Volume: (cm) 2.199 % Reduction in Area: 0% % Reduction in Volume: 0% Epithelialization: Small (1-33%) Tunneling: No Undermining: No Wound Description Classification: Full Thickness Without Exposed Support Structures Wound Margin: Distinct, outline attached Exudate Amount: Medium Exudate Type: Serosanguineous Exudate Color: red, brown Foul Odor After Cleansing: No Slough/Fibrino No Wound  Bed Granulation Amount: Medium (34-66%) Exposed Structure Granulation Quality: Red, Pink Fascia Exposed: No Necrotic Amount: Medium (34-66%) Fat Layer (Subcutaneous Tissue) Exposed: Yes Necrotic Quality: Adherent Slough Tendon Exposed: No Muscle Exposed: No Joint Exposed: No Bone Exposed: No Treatment Notes Wound #2 (Foot) Wound Laterality: Dorsal, Right Cleanser Soap and Water Discharge Instruction: May shower and wash wound with dial antibacterial soap and water prior to dressing change. Wound Cleanser Discharge Instruction: Cleanse the wound with wound cleanser prior to applying a clean dressing using gauze sponges, not tissue or cotton balls. Peri-Wound Care Triamcinolone 15 (g) Discharge Instruction: Use triamcinolone 15 (g) as directed Sween Lotion (Moisturizing lotion) Discharge Instruction: Apply moisturizing lotion as directed Topical Primary Dressing KerraCel Ag Gelling Fiber Dressing, 4x5 in (silver alginate) Discharge Instruction: Apply silver alginate to wound bed as instructed Secondary Dressing Woven Gauze Sponge, Non-Sterile 4x4 in Discharge Instruction: Apply over primary dressing as directed. Secured With Compression Wrap ThreePress (3 layer compression wrap) Discharge Instruction: Apply three layer compression as directed. Compression Stockings Circaid Juxta Lite Compression Wrap Quantity: 1 Right Leg Compression Amount: 30-40 mmHg Discharge Instruction: Apply Circaid Juxta Lite Compression Wrap daily as instructed. Apply first thing in the morning, remove at night before bed. Add-Ons Electronic Signature(s) Signed: 10/01/2020  5:35:12 PM By: Deon Pilling Signed: 10/01/2020 5:53:47 PM By: Lorrin Jackson Entered By: Deon Pilling on 10/01/2020 13:43:47 -------------------------------------------------------------------------------- Vitals Details Patient Name: Date of Service: Barbara Lucero B. 10/01/2020 1:15 PM Medical Record Number: [id number] Patient  Account Number: [id number] Date of Birth/Sex: Treating RN: June 17, 1940 (80 y.o. Sue Lush Primary Care Quanesha Klimaszewski: Allyn Kenner Other Clinician: Referring Jericho Cieslik: Treating Ibrahim Mcpheeters/Extender: Oretha Ellis in Treatment: 5 Vital Signs Time Taken: 13:27 Temperature (F): 97.9 Height (in): 63 Pulse (bpm): 73 Weight (lbs): 182 Respiratory Rate (breaths/min): 17 Body Mass Index (BMI): 32.2 Blood Pressure (mmHg): 150/65 Reference Range: 80 - 120 mg / dl Electronic Signature(s) Signed: 10/09/2020 11:33:27 AM By: Sandre Kitty Entered By: Sandre Kitty on 10/01/2020 13:29:23

## 2020-10-11 ENCOUNTER — Other Ambulatory Visit (HOSPITAL_COMMUNITY): Payer: Self-pay | Admitting: Internal Medicine

## 2020-10-11 DIAGNOSIS — L97919 Non-pressure chronic ulcer of unspecified part of right lower leg with unspecified severity: Secondary | ICD-10-CM

## 2020-10-16 ENCOUNTER — Encounter (HOSPITAL_BASED_OUTPATIENT_CLINIC_OR_DEPARTMENT_OTHER): Payer: Medicare Other | Admitting: Internal Medicine

## 2020-10-16 ENCOUNTER — Other Ambulatory Visit: Payer: Self-pay

## 2020-10-16 DIAGNOSIS — I739 Peripheral vascular disease, unspecified: Secondary | ICD-10-CM | POA: Diagnosis not present

## 2020-10-16 DIAGNOSIS — I87331 Chronic venous hypertension (idiopathic) with ulcer and inflammation of right lower extremity: Secondary | ICD-10-CM | POA: Diagnosis not present

## 2020-10-16 DIAGNOSIS — L97512 Non-pressure chronic ulcer of other part of right foot with fat layer exposed: Secondary | ICD-10-CM | POA: Diagnosis not present

## 2020-10-16 DIAGNOSIS — I872 Venous insufficiency (chronic) (peripheral): Secondary | ICD-10-CM | POA: Diagnosis not present

## 2020-10-16 DIAGNOSIS — I890 Lymphedema, not elsewhere classified: Secondary | ICD-10-CM | POA: Diagnosis not present

## 2020-10-16 DIAGNOSIS — L03115 Cellulitis of right lower limb: Secondary | ICD-10-CM | POA: Diagnosis not present

## 2020-10-16 DIAGNOSIS — L97511 Non-pressure chronic ulcer of other part of right foot limited to breakdown of skin: Secondary | ICD-10-CM | POA: Diagnosis not present

## 2020-10-17 NOTE — Progress Notes (Signed)
REIGHN, KAPLAN ([id number]) Visit Report for 10/16/2020 HPI Details Patient Name: Date of Service: Barbara Lucero 10/16/2020 1:00 PM Medical Record Number: [id number] Patient Account Number: [id number] Date of Birth/Sex: Treating RN: 11-12-1940 (80 y.o. Barbara Lucero, Barbara Lucero Primary Care Provider: [name] Other Clinician: Referring Provider: Treating Provider/Extender: [name] in Treatment: 7 History of Present Illness HPI Description: ADMISSION 08/23/2020 This is a 80 year old woman who arrives accompanied by her sister-in-law. 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 She has been dealing with chronic edema and discoloration of her legs for several years per her sister-in-law. I note that she saw Dr. Myra Gianotti on 05/21/2020 and he noted an extensive left leg DVT in 2019 although she is no longer on oral anticoagulants. He felt she had chronic venous insufficiency with skin changes related to this. He ordered her 20/30 stockings which were zippered stockings but I do not get the sense that the patient was all that compliant. In any case she started to develop weeping edema fluid recently coming out of the right lateral ankle serous drainage. She said it was pouring out last week. She saw her primary care provider on 08/08/2020 and was put on clindamycin which she is just finishing. She says the erythema feels better. Past medical history, aortic stenosis, chronic venous insufficiency, peripheral vascular disease, Crohn's disease which has been really recently quiescent, left hip fracture, DVT of the left leg in 2019. Notable that she has a very disabled son at home who she is the primary caregiver for Dr. Randie Heinz quoted an ABI of 1.12 on the right with a TBI of 0.65 on the left and 0.93 and a TBI of 0.58. In our clinic today the ABI was 0.83 on the right and 0.81 on the left 7/7 the patient's wound on the right lateral ankle is healed. Her edema control is much  better. It turns out she does not have stockings but came in with Tubigrip on the left leg Readmission: 10/01/2020 on evaluation today patient appears to be doing decently well in regard to her leg. Unfortunately she does have an opening which happened as a result of her not wearing the compression socks she had something on the leg which was okay not necessarily the best but unfortunately did not include anything for the foot which is the main issue that we are finding here. With that being said I discussed with the patient that she really needs to have something for both and I think ordering her juxta lite compression wrap would be the ideal thing to do. 10/09/2020; patient I last saw in early July. At that point she had an area on the right lateral ankle which closed over. I am not really sure what happened however she is readmitted to the clinic last week. She had an area on her right lateral ankle this is closed however she has another area on the right lateral foot also quite a bit of discomfort on the right anterior foot. She has nothing open on the left leg. She comes in today with a single juxta lite stocking. She has a mid calf compression stocking she bought off [employer] on the left 10/16/2020; everything that we are concerned about on the right leg is healed including the right lateral ankle and right dorsal foot. She has another juxta light for the right leg, she had 1 on the left. The venous reflux studies I ordered last week are on Thursday. I will  see her back 1 more time to review these. She had already been seen by Dr. Myra Gianotti Electronic Signature(s) Signed: 10/17/2020 8:02:04 AM By: Baltazar Najjar MD Entered By: Baltazar Najjar on 10/16/2020 14:20:59 -------------------------------------------------------------------------------- Physical Exam Details Patient Name: Date of Service: Barbara Sheets B. 10/16/2020 1:00 PM Medical Record Number: [id number] Patient Account Number:  [id number] Date of Birth/Sex: Treating RN: June 09, 1940 (80 y.o. Barbara Lucero Primary Care Provider: [name] Other Clinician: Referring Provider: Treating Provider/Extender: [name] in Treatment: 7 Constitutional Patient is hypertensive.. Pulse regular and within target range for patient.Marland Kitchen Respirations regular, non-labored and within target range.. Temperature is normal and within the target range for the patient.Marland Kitchen Appears in no distress. Cardiovascular Pedal pulses palpable on the right. Notes Wound exam; the patient had a superficial area on the right dorsal foot and the right lateral malleolus both of which are closed. We have good edema control. Electronic Signature(s) Signed: 10/17/2020 8:02:04 AM By: Baltazar Najjar MD Entered By: Baltazar Najjar on 10/16/2020 14:22:24 -------------------------------------------------------------------------------- Physician Orders Details Patient Name: Date of Service: Barbara Sheets B. 10/16/2020 1:00 PM Medical Record Number: [id number] Patient Account Number: [id number] Date of Birth/Sex: Treating RN: 1940/08/05 (80 y.o. Barbara Lucero, Barbara Lucero Primary Care Provider: [name] Other Clinician: Referring Provider: Treating Provider/Extender: [name] in Treatment: 7 Verbal / Phone Orders: No Diagnosis Coding Follow-up Appointments ppointment in 1 week. - Dr. Leanord Hawking for f/u on venous studies Return A Edema Control - Lymphedema / SCD / Other Elevate legs to the level of the heart or above for 30 minutes daily and/or when sitting, a frequency of: Avoid standing for long periods of time. Patient to wear own compression stockings every day. - wear juxtalites to both legs. apply in the morning and remove at bedtime Moisturize legs daily. Electronic Signature(s) Signed: 10/16/2020 5:31:05 PM By: Fonnie Mu RN Signed: 10/17/2020 8:02:04 AM By: Baltazar Najjar MD Entered By: Fonnie Mu on 10/16/2020 14:08:47 -------------------------------------------------------------------------------- Problem List Details Patient Name: Date of Service: Barbara Sheets B. 10/16/2020 1:00 PM Medical Record Number: [id number] Patient Account Number: [id number] Date of Birth/Sex: Treating RN: [birth year] (80 y.o. 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 10/16/2020 1:00 PM Medical Record Number: [id number] Patient Account Number: [id number] Date of Birth/Sex: Treating RN: 1940/08/11 (80 y.o. Barbara Lucero, Barbara Lucero Primary Care Provider: [name] Other Clinician: Referring Provider: Treating Provider/Extender: [name] in Treatment: 7 Subjective History of Present Illness (HPI) ADMISSION 08/23/2020 This is a 80 year old woman who arrives accompanied by her sister-in-law. 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 She has been dealing with chronic edema and discoloration of her legs for several years per her sister-in-law. I note that she saw Dr. Myra Gianotti on 05/21/2020 and he noted an extensive left  leg DVT in  2019 although she is no longer on oral anticoagulants. He felt she had chronic venous insufficiency with skin changes related to this. He ordered her 20/30 stockings which were zippered stockings but I do not get the sense that the patient was all that compliant. In any case she started to develop weeping edema fluid recently coming out of the right lateral ankle serous drainage. She said it was pouring out last week. She saw her primary care provider on 08/08/2020 and was put on clindamycin which she is just finishing. She says the erythema feels better. Past medical history, aortic stenosis, chronic venous insufficiency, peripheral vascular disease, Crohn's disease which has been really recently quiescent, left hip fracture, DVT of the left leg in 2019. Notable that she has a very disabled son at home who she is the primary caregiver for Dr. Randie Heinz quoted an ABI of 1.12 on the right with a TBI of 0.65 on the left and 0.93 and a TBI of 0.58. In our clinic today the ABI was 0.83 on the right and 0.81 on the left 7/7 the patient's wound on the right lateral ankle is healed. Her edema control is much better. It turns out she does not have stockings but came in with Tubigrip on the left leg Readmission: 10/01/2020 on evaluation today patient appears to be doing decently well in regard to her leg. Unfortunately she does have an opening which happened as a result of her not wearing the compression socks she had something on the leg which was okay not necessarily the best but unfortunately did not include anything for the foot which is the main issue that we are finding here. With that being said I discussed with the patient that she really needs to have something for both and I think ordering her juxta lite compression wrap would be the ideal thing to do. 10/09/2020; patient I last saw in early July. At that point she had an area on the right lateral ankle which closed over. I am not really sure what  happened however she is readmitted to the clinic last week. She had an area on her right lateral ankle this is closed however she has another area on the right lateral foot also quite a bit of discomfort on the right anterior foot. She has nothing open on the left leg. She comes in today with a single juxta lite stocking. She has a mid calf compression stocking she bought off [employer] on the left 10/16/2020; everything that we are concerned about on the right leg is healed including the right lateral ankle and right dorsal foot. She has another juxta light for the right leg, she had 1 on the left. The venous reflux studies I ordered last week are on Thursday. I will see her back 1 more time to review these. She had already been seen by Dr. Myra Gianotti Objective Constitutional Patient is hypertensive.. Pulse regular and within target range for patient.Marland Kitchen Respirations regular, non-labored and within target range.. Temperature is normal and within the target range for the patient.Marland Kitchen Appears in no distress. Vitals Time Taken: 1:23 PM, Height: 63 in, Source: Stated, Weight: 182 lbs, Source: Stated, BMI: 32.2, Temperature: 98.4 F, Pulse: 67 bpm, Respiratory Rate: 18 breaths/min, Blood Pressure: 173/72 mmHg. Cardiovascular Pedal pulses palpable on the right. General Notes: Wound exam; the patient had a superficial area on the right dorsal foot and the right lateral malleolus both of which are closed. We have good edema control. Integumentary (Hair, Skin) Wound #2 status  is Healed - Epithelialized. Original cause of wound was Shear/Friction. The date acquired was: 09/24/2020. The wound has been in treatment 2 weeks. The wound is located on the Right,Dorsal Foot. The wound measures 0cm length x 0cm width x 0cm depth; 0cm^2 area and 0cm^3 volume. The wound is limited to skin breakdown. There is no tunneling or undermining noted. There is a small amount of serous drainage noted. The wound margin is indistinct  and nonvisible. There is no granulation within the wound bed. There is no necrotic tissue within the wound bed. Assessment Active Problems ICD-10 Chronic venous hypertension (idiopathic) with ulcer and inflammation of right lower extremity Lymphedema, not elsewhere classified Non-pressure chronic ulcer of other part of right lower leg limited to breakdown of skin Cellulitis of right lower limb Plan Follow-up Appointments: Return Appointment in 1 week. - Dr. Leanord Hawkingobson for f/u on venous studies Edema Control - Lymphedema / SCD / Other: Elevate legs to the level of the heart or above for 30 minutes daily and/or when sitting, a frequency of: Avoid standing for long periods of time. Patient to wear own compression stockings every day. - wear juxtalites to both legs. apply in the morning and remove at bedtime Moisturize legs daily. 1. The patient I think has some degree of lymphedema. She has her juxta lite stockings for both legs now. 2. I am sending her for venous reflux studies to vein and vascular on Thursday based on this I will consider a [facility] referral to Dr. Myra GianottiBrabham. He had already ordered these but for 1 reason or another they do not seem to have been done Electronic Signature(s) Signed: 10/17/2020 8:02:04 AM By: Baltazar Najjarobson, Taiyo Kozma MD Entered By: Baltazar Najjarobson, Makinzee Durley on 10/16/2020 14:23:36 -------------------------------------------------------------------------------- SuperBill Details Patient Name: Date of Service: Barbara HangerWRA Y, LO UISE B. 10/16/2020 Medical Record Number: [id number] Patient Account Number: [id number] Date of Birth/Sex: Treating RN: 11/25/1940 (80 y.o. Barbara RowanF) Breedlove, Barbara Lucero Primary Care Provider: Nita SellsHall, John Other Clinician: Referring Provider: Treating Provider/Extender: Rolley Simsobson, Cordelia Bessinger Hall, John Weeks in Treatment: 7 Diagnosis Coding ICD-10 Codes Code Description [phone] Chronic venous hypertension (idiopathic) with ulcer and inflammation of right lower extremity I89.0  Lymphedema, not elsewhere classified L97.811 Non-pressure chronic ulcer of other part of right lower leg limited to breakdown of skin L03.115 Cellulitis of right lower limb Facility Procedures CPT4 Code: [id number] Description: [id number] - WOUND CARE VISIT-LEV 2 EST PT Modifier: Quantity: 1 Physician Procedures : CPT4 Code Description Modifier [id number] - WC PHYS LEVEL 3 - EST PT ICD-10 Diagnosis Description I87.331 Chronic venous hypertension (idiopathic) with ulcer and inflammation of right lower extremity I89.0 Lymphedema, not elsewhere classified  L97.811 Non-pressure chronic ulcer of other part of right lower leg limited to breakdown of skin Quantity: 1 Electronic Signature(s) Signed: 10/17/2020 8:02:04 AM By: Baltazar Najjarobson, Shonta Bourque MD Entered By: Baltazar Najjarobson, Antwan Bribiesca on 10/16/2020 14:23:57

## 2020-10-17 NOTE — Progress Notes (Signed)
OAKLYN, JAKUBEK ([id number]) Visit Report for 10/16/2020 Arrival Information Details Patient Name: Date of Service: Barbara Lucero 10/16/2020 1:00 PM Medical Record Number: [id number] Patient Account Number: [id number] Date of Birth/Sex: Treating RN: 06-Nov-1940 (80 y.o. [name] Primary Care Bunnie Lederman: Allyn Kenner Other Clinician: Referring Kambra Beachem: Treating Sadiq Mccauley/Extender: Adolm Joseph in Treatment: 7 Visit Information History Since Last Visit Added or deleted any medications: No Patient Arrived: Gilford Rile Any new allergies or adverse reactions: No Arrival Time: 13:16 Had a fall or experienced change in No Accompanied By: sister in law activities of daily living that may affect Transfer Assistance: None risk of falls: Patient Identification Verified: Yes Signs or symptoms of abuse/neglect since last visito No Secondary Verification Process Completed: Yes Hospitalized since last visit: No Patient Requires Transmission-Based Precautions: No Implantable device outside of the clinic excluding No Patient Has Alerts: No cellular tissue based products placed in the center since last visit: Has Dressing in Place as Prescribed: Yes Has Compression in Place as Prescribed: Yes Pain Present Now: Yes Electronic Signature(s) Signed: 10/16/2020 6:01:23 PM By: Baruch Gouty RN, BSN Entered By: Baruch Gouty on 10/16/2020 13:22:30 -------------------------------------------------------------------------------- Clinic Level of Care Assessment Details Patient Name: Date of Service: Barbara Lucero 10/16/2020 1:00 PM Medical Record Number: [id number] Patient Account Number: [id number] Date of Birth/Sex: Treating RN: [birth year] (80 y.o. [name], [name] Primary Care Austina Constantin: Allyn Kenner Other Clinician: Referring Renae Mottley: Treating Gradyn Shein/Extender: Adolm Joseph in Treatment: 7 Clinic Level of Care Assessment Items TOOL 4 Quantity  Score X- 1 0 Use when only an EandM is performed on FOLLOW-UP visit ASSESSMENTS - Nursing Assessment / Reassessment []  - 0 Reassessment of Co-morbidities (includes updates in patient status) []  - 0 Reassessment of Adherence to Treatment Plan ASSESSMENTS - Wound and Skin A ssessment / Reassessment X - Simple Wound Assessment / Reassessment - one wound 1 5 []  - 0 Complex Wound Assessment / Reassessment - multiple wounds []  - 0 Dermatologic / Skin Assessment (not related to wound area) ASSESSMENTS - Focused Assessment X- 1 5 Circumferential Edema Measurements - multi extremities []  - 0 Nutritional Assessment / Counseling / Intervention []  - 0 Lower Extremity Assessment (monofilament, tuning fork, pulses) []  - 0 Peripheral Arterial Disease Assessment (using hand held doppler) ASSESSMENTS - Ostomy and/or Continence Assessment and Care []  - 0 Incontinence Assessment and Management []  - 0 Ostomy Care Assessment and Management (repouching, etc.) PROCESS - Coordination of Care X - Simple Patient / Family Education for ongoing care 1 15 []  - 0 Complex (extensive) Patient / Family Education for ongoing care X- 1 10 Staff obtains Programmer, systems, Records, T Results / Process Orders est []  - 0 Staff telephones HHA, Nursing Homes / Clarify orders / etc []  - 0 Routine Transfer to another Facility (non-emergent condition) []  - 0 Routine Hospital Admission (non-emergent condition) []  - 0 New Admissions / Biomedical engineer / Ordering NPWT Apligraf, etc. , []  - 0 Emergency Hospital Admission (emergent condition) X- 1 10 Simple Discharge Coordination []  - 0 Complex (extensive) Discharge Coordination PROCESS - Special Needs []  - 0 Pediatric / Minor Patient Management []  - 0 Isolation Patient Management []  - 0 Hearing / Language / Visual special needs []  - 0 Assessment of Community assistance (transportation, D/C planning, etc.) []  - 0 Additional assistance / Altered  mentation []  - 0 Support Surface(s) Assessment (bed, cushion, seat, etc.) INTERVENTIONS - Wound Cleansing / Measurement X - Simple Wound Cleansing - one wound  1 5 []  - 0 Complex Wound Cleansing - multiple wounds X- 1 5 Wound Imaging (photographs - any number of wounds) []  - 0 Wound Tracing (instead of photographs) X- 1 5 Simple Wound Measurement - one wound []  - 0 Complex Wound Measurement - multiple wounds INTERVENTIONS - Wound Dressings X - Small Wound Dressing one or multiple wounds 1 10 []  - 0 Medium Wound Dressing one or multiple wounds []  - 0 Large Wound Dressing one or multiple wounds []  - 0 Application of Medications - topical []  - 0 Application of Medications - injection INTERVENTIONS - Miscellaneous []  - 0 External ear exam []  - 0 Specimen Collection (cultures, biopsies, blood, body fluids, etc.) []  - 0 Specimen(s) / Culture(s) sent or taken to Lab for analysis []  - 0 Patient Transfer (multiple staff / Civil Service fast streamer / Similar devices) []  - 0 Simple Staple / Suture removal (25 or less) []  - 0 Complex Staple / Suture removal (26 or more) []  - 0 Hypo / Hyperglycemic Management (close monitor of Blood Glucose) []  - 0 Ankle / Brachial Index (ABI) - do not check if billed separately X- 1 5 Vital Signs Has the patient been seen at the hospital within the last three years: Yes Total Score: 75 Level Of Care: New/Established - Level 2 Electronic Signature(s) Signed: 10/16/2020 5:31:05 PM By: Rhae Hammock RN Entered By: Rhae Hammock on 10/16/2020 14:12:38 -------------------------------------------------------------------------------- Encounter Discharge Information Details Patient Name: Date of Service: Barbara Cage B. 10/16/2020 1:00 PM Medical Record Number: [id number] Patient Account Number: [id number] Date of Birth/Sex: Treating RN: [birth year] (80 y.o. Sue Lush Primary Care Laiba Fuerte: Allyn Kenner Other Clinician: Referring Corrado Hymon: Treating  Zikeria Keough/Extender: Adolm Joseph in Treatment: 7 Encounter Discharge Information Items Discharge Condition: Stable Ambulatory Status: Walker Discharge Destination: Home Transportation: Private Auto Accompanied By: sister in law Schedule Follow-up Appointment: Yes Clinical Summary of Care: Provided on 10/16/2020 Form Type Recipient Paper Patient Patient Electronic Signature(s) Signed: 10/16/2020 5:22:39 PM By: Lorrin Jackson Entered By: Lorrin Jackson on 10/16/2020 17:22:38 -------------------------------------------------------------------------------- Lower Extremity Assessment Details Patient Name: Date of Service: Barbara Lucero 10/16/2020 1:00 PM Medical Record Number: [id number] Patient Account Number: [id number] Date of Birth/Sex: Treating RN: 1940/11/06 (80 y.o. [name] Primary Care Nayson Traweek: Allyn Kenner Other Clinician: Referring Bula Cavalieri: Treating Jonnie Truxillo/Extender: Adolm Joseph in Treatment: 7 Edema Assessment Assessed: [Left: No] [Right: No] Edema: [Left: Ye] [Right: s] Calf Left: Right: Point of Measurement: From Medial Instep 32 cm Ankle Left: Right: Point of Measurement: From Medial Instep 22 cm Vascular Assessment Pulses: Dorsalis Pedis Palpable: [Right:Yes] Electronic Signature(s) Signed: 10/16/2020 6:01:23 PM By: Baruch Gouty RN, BSN Entered By: Baruch Gouty on 10/16/2020 13:32:57 -------------------------------------------------------------------------------- Multi Wound Chart Details Patient Name: Date of Service: Barbara Cage B. 10/16/2020 1:00 PM Medical Record Number: [id number] Patient Account Number: [id number] Date of Birth/Sex: Treating RN: 08-01-1940 (80 y.o. [name], [name] Primary Care Hermie Reagor: Allyn Kenner Other Clinician: Referring Elza Varricchio: Treating Shimeka Bacot/Extender: Adolm Joseph in Treatment: 7 Vital Signs Height(in): 27 Pulse(bpm): 19 Weight(lbs):  182 Blood Pressure(mmHg): 173/72 Body Mass Index(BMI): 32 Temperature(F): 98.4 Respiratory Rate(breaths/min): 18 Photos: [N/A:N/A] Right, Dorsal Foot N/A N/A Wound Location: Shear/Friction N/A N/A Wounding Event: Venous Leg Ulcer N/A N/A Primary Etiology: Cataracts, Deep Vein Thrombosis, N/A N/A Comorbid History: Peripheral Venous Disease, Crohns, Osteoarthritis, Confinement Anxiety 09/24/2020 N/A N/A Date Acquired: 2 N/A N/A Weeks of Treatment: Healed - Epithelialized N/A N/A Wound Status: 0x0x0 N/A N/A  Measurements L x W x D (cm) 0 N/A N/A A (cm) : rea 0 N/A N/A Volume (cm) : 100.00% N/A N/A % Reduction in Area: 100.00% N/A N/A % Reduction in Volume: Full Thickness Without Exposed N/A N/A Classification: Support Structures Small N/A N/A Exudate Amount: Serous N/A N/A Exudate Type: amber N/A N/A Exudate Color: Indistinct, nonvisible N/A N/A Wound Margin: None Present (0%) N/A N/A Granulation Amount: None Present (0%) N/A N/A Necrotic Amount: Fascia: No N/A N/A Exposed Structures: Fat Layer (Subcutaneous Tissue): No Tendon: No Muscle: No Joint: No Bone: No Limited to Skin Breakdown Large (67-100%) N/A N/A Epithelialization: Treatment Notes Electronic Signature(s) Signed: 10/16/2020 5:31:05 PM By: Rhae Hammock RN Signed: 10/17/2020 8:02:04 AM By: Linton Ham MD Entered By: Linton Ham on 10/16/2020 14:19:45 -------------------------------------------------------------------------------- Multi-Disciplinary Care Plan Details Patient Name: Date of Service: Barbara Cage B. 10/16/2020 1:00 PM Medical Record Number: [id number] Patient Account Number: [id number] Date of Birth/Sex: Treating RN: 13-Jun-1940 (80 y.o. [name], [name] Primary Care Ciera Beckum: Allyn Kenner Other Clinician: Referring Antonio Creswell: Treating Cache Bills/Extender: Adolm Joseph in Treatment: 7 Active Inactive Wound/Skin Impairment Nursing  Diagnoses: Knowledge deficit related to ulceration/compromised skin integrity Goals: Patient/caregiver will verbalize understanding of skin care regimen Date Initiated: 08/23/2020 Date Inactivated: 08/30/2020 Target Resolution Date: 08/31/2020 Goal Status: Met Ulcer/skin breakdown will have a volume reduction of 30% by week 4 Date Initiated: 10/01/2020 Target Resolution Date: 11/22/2020 Goal Status: Active Interventions: Assess patient/caregiver ability to obtain necessary supplies Assess patient/caregiver ability to perform ulcer/skin care regimen upon admission and as needed Provide education on ulcer and skin care Treatment Activities: Skin care regimen initiated : 08/23/2020 Topical wound management initiated : 08/23/2020 Notes: Electronic Signature(s) Signed: 10/16/2020 5:31:05 PM By: Rhae Hammock RN Entered By: Rhae Hammock on 10/16/2020 14:11:43 -------------------------------------------------------------------------------- Pain Assessment Details Patient Name: Date of Service: Barbara Cage B. 10/16/2020 1:00 PM Medical Record Number: [id number] Patient Account Number: [id number] Date of Birth/Sex: Treating RN: [birth year] (80 y.o. [name] Primary Care Andrw Mcguirt: Allyn Kenner Other Clinician: Referring Lowen Mansouri: Treating Alveena Taira/Extender: Adolm Joseph in Treatment: 7 Active Problems Location of Pain Severity and Description of Pain Patient Has Paino Yes Site Locations Pain Location: Pain in Ulcers Duration of the Pain. Constant / Intermittento Intermittent Rate the pain. Current Pain Level: 0 Worst Pain Level: 5 Least Pain Level: 0 Character of Pain Describe the Pain: Burning Pain Management and Medication Current Pain Management: Medication: Yes Is the Current Pain Management Adequate: Adequate How does your wound impact your activities of daily livingo Sleep: No Bathing: No Appetite: No Relationship With Others:  No Bladder Continence: No Emotions: No Bowel Continence: No Work: No Toileting: No Drive: No Dressing: No Hobbies: No Engineer, maintenance) Signed: 10/16/2020 6:01:23 PM By: Baruch Gouty RN, BSN Entered By: Baruch Gouty on 10/16/2020 13:25:25 -------------------------------------------------------------------------------- Patient/Caregiver Education Details Patient Name: Date of Service: Barbara Lucero 8/23/2022andnbsp1:00 PM Medical Record Number: [id number] Patient Account Number: [id number] Date of Birth/Gender: Treating RN: 03/21/1940 (80 y.o. [name], [name] Primary Care Physician: Allyn Kenner Other Clinician: Referring Physician: Treating Physician/Extender: Adolm Joseph in Treatment: 7 Education Assessment Education Provided To: Patient Education Topics Provided Basic Hygiene: Methods: Explain/Verbal Responses: State content correctly Wound/Skin Impairment: Methods: Explain/Verbal Responses: State content correctly Electronic Signature(s) Signed: 10/16/2020 5:31:05 PM By: Rhae Hammock RN Entered By: Rhae Hammock on 10/16/2020 14:11:59 -------------------------------------------------------------------------------- Wound Assessment Details Patient Name: Date of Service: Barbara Cage B. 10/16/2020 1:00 PM Medical  Record Number: [id number] Patient Account Number: [id number] Date of Birth/Sex: Treating RN: 01/14/1941 (80 y.o. [name], [name] Primary Care Gisella Alwine: Allyn Kenner Other Clinician: Referring Cabell Lazenby: Treating Shatera Rennert/Extender: Adolm Joseph in Treatment: 7 Wound Status Wound Number: 2 Primary Venous Leg Ulcer Etiology: Wound Location: Right, Dorsal Foot Wound Healed - Epithelialized Wounding Event: Shear/Friction Status: Date Acquired: 09/24/2020 Comorbid Cataracts, Deep Vein Thrombosis, Peripheral Venous Disease, Weeks Of Treatment: 2 History: Crohns, Osteoarthritis,  Confinement Anxiety Clustered Wound: No Photos Wound Measurements Length: (cm) Width: (cm) Depth: (cm) Area: (cm) Volume: (cm) 0 % Reduction in Area: 100% 0 % Reduction in Volume: 100% 0 Epithelialization: Large (67-100%) 0 Tunneling: No 0 Undermining: No Wound Description Classification: Full Thickness Without Exposed Support Structures Wound Margin: Indistinct, nonvisible Exudate Amount: Small Exudate Type: Serous Exudate Color: amber Foul Odor After Cleansing: No Slough/Fibrino No Wound Bed Granulation Amount: None Present (0%) Exposed Structure Necrotic Amount: None Present (0%) Fascia Exposed: No Fat Layer (Subcutaneous Tissue) Exposed: No Tendon Exposed: No Muscle Exposed: No Joint Exposed: No Bone Exposed: No Limited to Skin Breakdown Treatment Notes Wound #2 (Foot) Wound Laterality: Dorsal, Right Cleanser Peri-Wound Care Topical Primary Dressing Secondary Dressing Secured With Compression Wrap Compression Stockings Add-Ons Electronic Signature(s) Signed: 10/16/2020 5:31:05 PM By: Rhae Hammock RN Entered By: Rhae Hammock on 10/16/2020 14:08:01 -------------------------------------------------------------------------------- Vitals Details Patient Name: Date of Service: Barbara Cage B. 10/16/2020 1:00 PM Medical Record Number: [id number] Patient Account Number: [id number] Date of Birth/Sex: Treating RN: 12-03-1940 (80 y.o. Martyn Malay, Linda Primary Care Chandler Swiderski: Allyn Kenner Other Clinician: Referring Jarvin Ogren: Treating Eldoris Beiser/Extender: Adolm Joseph in Treatment: 7 Vital Signs Time Taken: 13:23 Temperature (F): 98.4 Height (in): 63 Pulse (bpm): 67 Source: Stated Respiratory Rate (breaths/min): 18 Weight (lbs): 182 Blood Pressure (mmHg): 173/72 Source: Stated Reference Range: 80 - 120 mg / dl Body Mass Index (BMI): 32.2 Electronic Signature(s) Signed: 10/16/2020 6:01:23 PM By: Baruch Gouty RN,  BSN Entered By: Baruch Gouty on 10/16/2020 13:24:22

## 2020-10-18 ENCOUNTER — Ambulatory Visit (HOSPITAL_COMMUNITY)
Admission: RE | Admit: 2020-10-18 | Discharge: 2020-10-18 | Disposition: A | Discharge status: Home / Self Care | Payer: Medicare Other | Source: Ambulatory Visit | Attending: Internal Medicine | Admitting: Internal Medicine

## 2020-10-18 ENCOUNTER — Other Ambulatory Visit: Payer: Self-pay

## 2020-10-18 DIAGNOSIS — L97919 Non-pressure chronic ulcer of unspecified part of right lower leg with unspecified severity: Secondary | ICD-10-CM | POA: Diagnosis not present

## 2020-10-23 ENCOUNTER — Encounter (HOSPITAL_BASED_OUTPATIENT_CLINIC_OR_DEPARTMENT_OTHER): Payer: Medicare Other | Admitting: Internal Medicine

## 2020-10-30 ENCOUNTER — Encounter (HOSPITAL_BASED_OUTPATIENT_CLINIC_OR_DEPARTMENT_OTHER): Payer: Medicare Other | Attending: Internal Medicine | Admitting: Internal Medicine

## 2020-10-30 ENCOUNTER — Other Ambulatory Visit: Payer: Self-pay

## 2020-10-30 DIAGNOSIS — I10 Essential (primary) hypertension: Secondary | ICD-10-CM | POA: Diagnosis not present

## 2020-10-30 DIAGNOSIS — I739 Peripheral vascular disease, unspecified: Secondary | ICD-10-CM | POA: Diagnosis not present

## 2020-10-30 DIAGNOSIS — I87331 Chronic venous hypertension (idiopathic) with ulcer and inflammation of right lower extremity: Secondary | ICD-10-CM | POA: Insufficient documentation

## 2020-10-30 DIAGNOSIS — I890 Lymphedema, not elsewhere classified: Secondary | ICD-10-CM | POA: Insufficient documentation

## 2020-10-30 DIAGNOSIS — L97811 Non-pressure chronic ulcer of other part of right lower leg limited to breakdown of skin: Secondary | ICD-10-CM | POA: Diagnosis not present

## 2020-10-30 DIAGNOSIS — Z86718 Personal history of other venous thrombosis and embolism: Secondary | ICD-10-CM | POA: Insufficient documentation

## 2020-10-30 DIAGNOSIS — I350 Nonrheumatic aortic (valve) stenosis: Secondary | ICD-10-CM | POA: Insufficient documentation

## 2020-10-30 DIAGNOSIS — L03115 Cellulitis of right lower limb: Secondary | ICD-10-CM | POA: Insufficient documentation

## 2020-10-31 NOTE — Progress Notes (Signed)
Barbara, Lucero ([id number]) Visit Report for 10/30/2020 HPI Details Patient Name: Date of Service: Barbara Lucero 10/30/2020 1:15 PM Medical Record Number: [id number] Patient Account Number: [id number] Date of Birth/Sex: Treating RN: 10-Sep-1940 (80 [name] Barbara Lucero, Barbara Lucero Primary Care Provider: Nita Lucero Other Clinician: Referring Provider: Treating Provider/Extender: Barbara Lucero in Treatment: 9 History of Present Illness HPI Description: ADMISSION 08/23/2020 This is a 80 year old woman who arrives accompanied by her sister-in-law. 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 She has been dealing with chronic edema and discoloration of her legs for several years per her sister-in-law. I note that she saw Dr. Myra Lucero on 05/21/2020 and he noted an extensive left leg DVT in 2019 although she is no longer on oral anticoagulants. He felt she had chronic venous insufficiency with skin changes related to this. He ordered her 20/30 stockings which were zippered stockings but I do not get the sense that the patient was all that compliant. In any case she started to develop weeping edema fluid recently coming out of the right lateral ankle serous drainage. She said it was pouring out last week. She saw her primary care provider on 08/08/2020 and was put on clindamycin which she is just finishing. She says the erythema feels better. Past medical history, aortic stenosis, chronic venous insufficiency, peripheral vascular disease, Crohn's disease which has been really recently quiescent, left hip fracture, DVT of the left leg in 2019. Notable that she has a very disabled son at home who she is the primary caregiver for Dr. Randie Lucero quoted an ABI of 1.12 on the right with a TBI of 0.65 on the left and 0.93 and a TBI of 0.58. In our clinic today the ABI was 0.83 on the right and 0.81 on the left 7/7 the patient's wound on the right lateral ankle is healed. Her edema control is much better.  It turns out she does not have stockings but came in with Tubigrip on the left leg Readmission: 10/01/2020 on evaluation today patient appears to be doing decently well in regard to her leg. Unfortunately she does have an opening which happened as a result of her not wearing the compression socks she had something on the leg which was okay not necessarily the best but unfortunately did not include anything for the foot which is the main issue that we are finding here. With that being said I discussed with the patient that she really needs to have something for both and I think ordering her juxta lite compression wrap would be the ideal thing to do. 10/09/2020; patient I last saw in early July. At that point she had an area on the right lateral ankle which closed over. I am not really sure what happened however she is readmitted to the clinic last week. She had an area on her right lateral ankle this is closed however she has another area on the right lateral foot also quite a bit of discomfort on the right anterior foot. She has nothing open on the left leg. She comes in today with a single juxta lite stocking. She has a mid calf compression stocking she bought off [employer] on the left 10/16/2020; everything that we are concerned about on the right leg is healed including the right lateral ankle and right dorsal foot. She has another juxta light for the right leg, she had 1 on the left. The venous reflux studies I ordered last week are on Thursday. I will  see her back 1 more time to review these. She had already been seen by Dr. Myra Lucero 9/6; back in to discuss her reflux studies. This showed reflux in the greater saphenous vein on the right. I am going to send her back for Dr. Myra Lucero to review these. She is wearing her juxta lite stockings but only had 20/30 mmHg compression. She became concerned this morning with erythema on the right leg. She says she has an appoint with Dr. Myra Lucero in about 2  weeks Electronic Signature(s) Signed: 10/30/2020 5:13:37 PM By: Barbara Najjar MD Entered By: Barbara Lucero on 10/30/2020 14:11:22 -------------------------------------------------------------------------------- Physical Exam Details Patient Name: Date of Service: Barbara Sheets B. 10/30/2020 1:15 PM Medical Record Number: [id number] Patient Account Number: [id number] Date of Birth/Sex: Treating RN: 1940/10/30 (80 [name] Barbara Lucero Primary Care Provider: Nita Lucero Other Clinician: Referring Provider: Treating Provider/Extender: Barbara Lucero in Treatment: 9 Constitutional Patient is hypertensive.. Pulse regular and within target range for patient.Marland Kitchen Respirations regular, non-labored and within target range.. Temperature is normal and within the target range for the patient.Marland Kitchen Appears in no distress. Notes Wound exam; the patient has no open area. Both the area on the right dorsal foot and right lateral malleolus of closed. Edema control however is only marginal. Some erythema but no warmth or tenderness in the right lower leg Electronic Signature(s) Signed: 10/30/2020 5:13:37 PM By: Barbara Najjar MD Entered By: Barbara Lucero on 10/30/2020 14:13:06 -------------------------------------------------------------------------------- Physician Orders Details Patient Name: Date of Service: Barbara Sheets B. 10/30/2020 1:15 PM Medical Record Number: [id number] Patient Account Number: [id number] Date of Birth/Sex: Treating RN: 1940-11-18 (80 [name] Barbara Lucero Primary Care Provider: Nita Lucero Other Clinician: Referring Provider: Treating Provider/Extender: Barbara Lucero in Treatment: 9 Verbal / Phone Orders: No Diagnosis Coding ICD-10 Coding Code Description I87.331 Chronic venous hypertension (idiopathic) with ulcer and inflammation of right lower extremity I89.0 Lymphedema, not elsewhere classified L97.811 Non-pressure chronic ulcer of  other part of right lower leg limited to breakdown of skin L03.115 Cellulitis of right lower limb Discharge From [facility] Services Discharge from Wound Care Center Edema Control - Lymphedema / SCD / Other Elevate legs to the level of the heart or above for 30 minutes daily and/or when sitting, a frequency of: - throughout the day Avoid standing for long periods of time. Patient to wear own compression stockings every day. - wear juxtalites to both legs. apply in the morning and remove at bedtime Moisturize legs daily. Compression stocking or Garment 30-40 mm/Hg pressure to: - Juxtalite to right leg daily Consults Vascular Surgeon - Dr. Myra Lucero - Abnormal venous reflux study 8/25 - (ICD10 I87.331 - Chronic venous hypertension (idiopathic) with ulcer and inflammation of right lower extremity) Electronic Signature(s) Signed: 10/30/2020 5:13:37 PM By: Barbara Najjar MD Signed: 10/30/2020 5:42:35 PM By: Zandra Abts RN, BSN Entered By: Zandra Abts on 10/30/2020 13:57:22 -------------------------------------------------------------------------------- Problem List Details Patient Name: Date of Service: Barbara Sheets B. 10/30/2020 1:15 PM Medical Record Number: [id number] Patient Account Number: [id number] Date of Birth/Sex: Treating RN: [birth year] (76 [name] Barbara Lucero Primary Care Provider: Nita Lucero Other Clinician: Referring Provider: Treating Provider/Extender: Barbara Lucero in Treatment: 9 Active Problems ICD-10 Encounter Code Description Active Date MDM Diagnosis I87.331 Chronic venous hypertension (idiopathic) with ulcer and inflammation of right 08/23/2020 No Yes lower extremity I89.0 Lymphedema, not elsewhere classified 08/23/2020 No Yes L97.811 Non-pressure chronic ulcer of other part of right lower leg limited  to breakdown 08/23/2020 No Yes of skin L03.115 Cellulitis of right lower limb 10/09/2020 No Yes Inactive Problems Resolved Problems Electronic  Signature(s) Signed: 10/30/2020 5:13:37 PM By: Barbara Najjarobson, Anisah Kuck MD Entered By: Barbara Najjarobson, Ramelo Oetken on 10/30/2020 14:08:48 -------------------------------------------------------------------------------- Progress Note Details Patient Name: Date of Service: Barbara Lucero, Barbara UISE B. 10/30/2020 1:15 PM Medical Record Number: [id number] Patient Account Number: [id number] Date of Birth/Sex: Treating RN: 02/07/1941 (80 [name] Barbara RowanF) Breedlove, Barbara Lucero Primary Care Provider: Nita SellsHall, John Other Clinician: Referring Provider: Treating Provider/Extender: Barbara Simsobson, Rayquon Uselman Hall, John Weeks in Treatment: 9 Subjective History of Present Illness (HPI) ADMISSION 08/23/2020 This is a 80 year old woman who arrives accompanied by her sister-in-law. 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 She has been dealing with chronic edema and discoloration of her legs for several years per her sister-in-law. I note that she saw Dr. Myra GianottiBrabham on 05/21/2020 and he noted an extensive left leg DVT in 2019 although she is no longer on oral anticoagulants. He felt she had chronic venous insufficiency with skin changes related to this. He ordered her 20/30 stockings which were zippered stockings but I do not get the sense that the patient was all that compliant. In any case she started to develop weeping edema fluid recently coming out of the right lateral ankle serous drainage. She said it was pouring out last week. She saw her primary care provider on 08/08/2020 and was put on clindamycin which she is just finishing. She says the erythema feels better. Past medical history, aortic stenosis, chronic venous insufficiency, peripheral vascular disease, Crohn's disease which has been really recently quiescent, left hip fracture, DVT of the left leg in 2019. Notable that she has a very disabled son at home who she is the primary caregiver for Dr. Randie Heinzain quoted an ABI of 1.12 on the right with a TBI of 0.65 on the left and 0.93 and a TBI of 0.58. In our clinic  today the ABI was 0.83 on the right and 0.81 on the left 7/7 the patient's wound on the right lateral ankle is healed. Her edema control is much better. It turns out she does not have stockings but came in with Tubigrip on the left leg Readmission: 10/01/2020 on evaluation today patient appears to be doing decently well in regard to her leg. Unfortunately she does have an opening which happened as a result of her not wearing the compression socks she had something on the leg which was okay not necessarily the best but unfortunately did not include anything for the foot which is the main issue that we are finding here. With that being said I discussed with the patient that she really needs to have something for both and I think ordering her juxta lite compression wrap would be the ideal thing to do. 10/09/2020; patient I last saw in early July. At that point she had an area on the right lateral ankle which closed over. I am not really sure what happened however she is readmitted to the clinic last week. She had an area on her right lateral ankle this is closed however she has another area on the right lateral foot also quite a bit of discomfort on the right anterior foot. She has nothing open on the left leg. She comes in today with a single juxta lite stocking. She has a mid calf compression stocking she bought off [employer] on the left 10/16/2020; everything that we are concerned about on the right leg is healed including the right lateral ankle  and right dorsal foot. She has another juxta light for the right leg, she had 1 on the left. The venous reflux studies I ordered last week are on Thursday. I will see her back 1 more time to review these. She had already been seen by Dr. Myra Lucero 9/6; back in to discuss her reflux studies. This showed reflux in the greater saphenous vein on the right. I am going to send her back for Dr. Myra Lucero to review these. She is wearing her juxta lite stockings but only had  20/30 mmHg compression. She became concerned this morning with erythema on the right leg. She says she has an appoint with Dr. Myra Lucero in about 2 weeks Objective Constitutional Patient is hypertensive.. Pulse regular and within target range for patient.Marland Kitchen Respirations regular, non-labored and within target range.. Temperature is normal and within the target range for the patient.Marland Kitchen Appears in no distress. Vitals Time Taken: 1:30 PM, Height: 63 in, Weight: 182 lbs, BMI: 32.2, Temperature: 98.0 F, Pulse: 74 bpm, Respiratory Rate: 18 breaths/min, Blood Pressure: 174/75 mmHg. General Notes: Wound exam; the patient has no open area. Both the area on the right dorsal foot and right lateral malleolus of closed. Edema control however is only marginal. Some erythema but no warmth or tenderness in the right lower leg Assessment Active Problems ICD-10 Chronic venous hypertension (idiopathic) with ulcer and inflammation of right lower extremity Lymphedema, not elsewhere classified Non-pressure chronic ulcer of other part of right lower leg limited to breakdown of skin Cellulitis of right lower limb Plan Discharge From [facility] Services: Discharge from Wound Care Center Edema Control - Lymphedema / SCD / Other: Elevate legs to the level of the heart or above for 30 minutes daily and/or when sitting, a frequency of: - throughout the day Avoid standing for long periods of time. Patient to wear own compression stockings every day. - wear juxtalites to both legs. apply in the morning and remove at bedtime Moisturize legs daily. Compression stocking or Garment 30-40 mm/Hg pressure to: - Juxtalite to right leg daily Consults ordered were: Vascular Surgeon - Dr. Myra Lucero - Abnormal venous reflux study 8/25 1. We have increase the compression on the juxta lite stocking to 30/40 mmHg 2. 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 I am going to make her a follow-up appointment with Dr. Myra Lucero as it turns out she did not seem to have 1 in epic that would be to look at the greater saphenous vein reflux 4. The patient can be discharged from the clinic Electronic Signature(s) Signed: 10/30/2020 5:13:37 PM By: Barbara Najjar MD Entered By: Barbara Lucero on 10/30/2020 14:14:06 -------------------------------------------------------------------------------- SuperBill Details Patient Name: Date of Service: Barbara Lucero 10/30/2020 Medical Record Number: [id number] Patient Account Number: [id number] Date of Birth/Sex: Treating RN: 1941/01/06 (9 [name] Barbara Lucero, Barbara Lucero Primary Care Provider: Nita Lucero Other Clinician: Referring Provider: Treating Provider/Extender: Barbara Lucero in Treatment: 9 Diagnosis Coding ICD-10 Codes Code Description [phone] Chronic venous hypertension (idiopathic) with ulcer and inflammation of right lower extremity I89.0 Lymphedema, not elsewhere classified L97.811 Non-pressure chronic ulcer of other part of right lower leg limited to breakdown of skin L03.115 Cellulitis of right lower limb Facility Procedures CPT4 Code: [id number] Description: [phone] - WOUND CARE VISIT-LEV 2 EST PT Modifier: Quantity: 1 Physician Procedures : CPT4 Code Description Modifier [id number] - WC PHYS LEVEL 3 - EST PT ICD-10  Diagnosis Description L97.811 Non-pressure chronic ulcer of other part of right lower leg limited to breakdown of skin I87.331 Chronic venous hypertension (idiopathic) with  ulcer and inflammation of right lower extremity I89.0 Lymphedema, not elsewhere classified Quantity: 1 Electronic Signature(s) Signed: 10/30/2020 5:42:35 PM By: Zandra Abts RN, BSN Signed: 10/31/2020 3:40:46 PM By: Barbara Najjar MD Previous Signature: 10/30/2020 5:13:37 PM Version By: Barbara Najjar MD Entered By: Zandra Abts on 10/30/2020 17:33:11

## 2020-11-01 NOTE — Progress Notes (Signed)
DREAM, HARMAN ([id number]) Visit Report for 10/30/2020 Arrival Information Details Patient Name: Date of Service: Barbara Lucero 10/30/2020 1:15 PM Medical Record Number: [id number] Patient Account Number: [id number] Date of Birth/Sex: Treating RN: 1940-03-06 (80 y.o. Barbara Lucero, Barbara Lucero Primary Care Barbara Lucero: Barbara Lucero Other Clinician: Referring Shawnie Nicole: Treating Barbara Lucero/Extender: Barbara Lucero in Treatment: 9 Visit Information History Since Last Visit Added or deleted any medications: No Patient Arrived: Barbara Lucero Any new allergies or adverse reactions: No Arrival Time: 13:30 Had a fall or experienced change in No Accompanied By: family activities of daily living that may affect Transfer Assistance: None risk of falls: Patient Identification Verified: Yes Signs or symptoms of abuse/neglect since last visito No Secondary Verification Process Completed: Yes Hospitalized since last visit: No Patient Requires Transmission-Based Precautions: No Implantable device outside of the clinic excluding No Patient Has Alerts: No cellular tissue based products placed in the center since last visit: Has Dressing in Place as Prescribed: Yes Pain Present Now: Yes Electronic Signature(s) Signed: 11/01/2020 11:10:38 AM By: Barbara Lucero Entered By: Barbara Lucero on 10/30/2020 13:30:54 -------------------------------------------------------------------------------- Clinic Level of Care Assessment Details Patient Name: Date of Service: Barbara Lucero 10/30/2020 1:15 PM Medical Record Number: [id number] Patient Account Number: [id number] Date of Birth/Sex: Treating RN: 01/23/1941 (80 y.o. Barbara Lucero Primary Care Pria Klosinski: Barbara Lucero Other Clinician: Referring Barbara Lucero: Treating Barbara Lucero/Extender: Barbara Lucero in Treatment: 9 Clinic Level of Care Assessment Items TOOL 4 Quantity Score X- 1 0 Use when only an EandM is performed on FOLLOW-UP  visit ASSESSMENTS - Nursing Assessment / Reassessment X- 1 10 Reassessment of Co-morbidities (includes updates in patient status) X- 1 5 Reassessment of Adherence to Treatment Plan ASSESSMENTS - Wound and Skin A ssessment / Reassessment []  - 0 Simple Wound Assessment / Reassessment - one wound []  - 0 Complex Wound Assessment / Reassessment - multiple wounds X- 1 10 Dermatologic / Skin Assessment (not related to wound area) ASSESSMENTS - Focused Assessment []  - 0 Circumferential Edema Measurements - multi extremities []  - 0 Nutritional Assessment / Counseling / Intervention X- 1 5 Lower Extremity Assessment (monofilament, tuning fork, pulses) []  - 0 Peripheral Arterial Disease Assessment (using hand held doppler) ASSESSMENTS - Ostomy and/or Continence Assessment and Care []  - 0 Incontinence Assessment and Management []  - 0 Ostomy Care Assessment and Management (repouching, etc.) PROCESS - Coordination of Care X - Simple Patient / Family Education for ongoing care 1 15 []  - 0 Complex (extensive) Patient / Family Education for ongoing care X- 1 10 Staff obtains Consents, Records, T Results / Process Orders est []  - 0 Staff telephones HHA, Nursing Homes / Clarify orders / etc []  - 0 Routine Transfer to another Facility (non-emergent condition) []  - 0 Routine Hospital Admission (non-emergent condition) []  - 0 New Admissions / / Ordering NPWT Apligraf, etc. , []  - 0 Emergency Hospital Admission (emergent condition) X- 1 10 Simple Discharge Coordination []  - 0 Complex (extensive) Discharge Coordination PROCESS - Special Needs []  - 0 Pediatric / Minor Patient Management []  - 0 Isolation Patient Management []  - 0 Hearing / Language / Visual special needs []  - 0 Assessment of Community assistance (transportation, D/C planning, etc.) []  - 0 Additional assistance / Altered mentation []  - 0 Support Surface(s) Assessment (bed, cushion, seat,  etc.) INTERVENTIONS - Wound Cleansing / Measurement []  - 0 Simple Wound Cleansing - one wound []  - 0 Complex Wound Cleansing - multiple wounds []  -  0 Wound Imaging (photographs - any number of wounds) []  - 0 Wound Tracing (instead of photographs) []  - 0 Simple Wound Measurement - one wound []  - 0 Complex Wound Measurement - multiple wounds INTERVENTIONS - Wound Dressings []  - 0 Small Wound Dressing one or multiple wounds []  - 0 Medium Wound Dressing one or multiple wounds []  - 0 Large Wound Dressing one or multiple wounds []  - 0 Application of Medications - topical []  - 0 Application of Medications - injection INTERVENTIONS - Miscellaneous []  - 0 External ear exam []  - 0 Specimen Collection (cultures, biopsies, blood, body fluids, etc.) []  - 0 Specimen(s) / Culture(s) sent or taken to Lab for analysis []  - 0 Patient Transfer (multiple staff / / Similar devices) []  - 0 Simple Staple / Suture removal (25 or less) []  - 0 Complex Staple / Suture removal (26 or more) []  - 0 Hypo / Hyperglycemic Management (close monitor of Blood Glucose) []  - 0 Ankle / Brachial Index (ABI) - do not check if billed separately X- 1 5 Vital Signs Has the patient been seen at the hospital within the last three years: Yes Total Score: 70 Level Of Care: New/Established - Level 2 Electronic Signature(s) Signed: 10/30/2020 5:42:35 PM By: RN, BSN Entered By: on 10/30/2020 17:33:02 -------------------------------------------------------------------------------- Encounter Discharge Information Details Patient Name: Date of Service: B. 10/30/2020 1:15 PM Medical Record Number: Patient Account Number: Date of Birth/Sex: Treating RN: 09-28-1940 (80 y.o. Primary Care Katherine Tout: Nurse, adult Other Clinician: Referring Devun Anna: Treating Yogesh Cominsky/Extender: in Treatment:  9 Encounter Discharge Information Items Discharge Condition: Stable Ambulatory Status: Walker Discharge Destination: Home Transportation: Private Auto Accompanied By: alone Schedule Follow-up Appointment: Yes Clinical Summary of Care: Patient Declined Electronic Signature(s) Signed: 10/30/2020 5:42:35 PM By: RN, BSN Entered By: on 10/30/2020 17:33:40 -------------------------------------------------------------------------------- Lower Extremity Assessment Details Patient Name: Date of Service: Barbara Abts B. 10/30/2020 1:15 PM Medical Record Number: 12/30/2020 Patient Account Number: Barbara Sheets Date of Birth/Sex: Treating RN: 07/08/1940 (80 y.o. [id number] Primary Care Erland Vivas: 08/27/1940 Other Clinician: Referring Barbara Lucero: Treating Tukker Byrns/Extender: 72 in Treatment: 9 Edema Assessment Assessed: Barbara Lucero: No] Barbara Lucero: No] Edema: [Left: Ye] [Right: s] Calf Left: Right: Point of Measurement: From Medial Instep 32 cm Ankle Left: Right: Point of Measurement: From Medial Instep 22 cm Vascular Assessment Pulses: Dorsalis Pedis Palpable: [Right:Yes] Electronic Signature(s) Signed: 10/30/2020 5:42:35 PM By: 12/30/2020 RN, BSN Entered By: Barbara Abts on 10/30/2020 13:53:50 -------------------------------------------------------------------------------- Multi Wound Chart Details Patient Name: Date of Service: 12/30/2020 B. 10/30/2020 1:15 PM Medical Record Number: 12/30/2020 Patient Account Number: [id number] Date of Birth/Sex: Treating RN: September 22, 1940 (80 y.o. 72, Barbara Lucero Primary Care Ziggy Reveles: Barbara Lucero Other Clinician: Referring Barbara Lucero: Treating Severo Beber/Extender: Barbara Lucero in Treatment: 9 Vital Signs Height(in): 63 Pulse(bpm): 74 Weight(lbs): 182 Blood Pressure(mmHg): 174/75 Body Mass Index(BMI): 32 Temperature(F): 98.0 Respiratory Rate(breaths/min): 18 Wound  Assessments Treatment Notes Electronic Signature(s) Signed: 10/30/2020 5:13:37 PM By: [name] MD Signed: 10/30/2020 5:22:11 PM By: 12/30/2020 RN Entered By: Barbara Abts on 10/30/2020 14:08:56 -------------------------------------------------------------------------------- Multi-Disciplinary Care Plan Details Patient Name: Date of Service: 12/30/2020 B. 10/30/2020 1:15 PM Medical Record Number: 12/30/2020 Patient Account Number: [id number] Date of Birth/Sex: Treating RN: [birth year] (80 y.o. 70 Primary Care Ori Kreiter: Barbara Lucero Other Clinician: Referring Avigail Pilling: Treating Carloyn Lahue/Extender:  Barbara Lucero in Treatment: 9 Active Inactive Electronic Signature(s) Signed: 10/30/2020 5:42:35 PM By: Barbara Abts RN, BSN Entered By: Barbara Abts on 10/30/2020 17:32:15 -------------------------------------------------------------------------------- Pain Assessment Details Patient Name: Date of Service: Barbara Lucero 10/30/2020 1:15 PM Medical Record Number: [id number] Patient Account Number: [id number] Date of Birth/Sex: Treating RN: 22-Feb-1941 (80 y.o. Barbara Lucero, Barbara Lucero Primary Care Laniesha Das: Barbara Lucero Other Clinician: Referring Barbara Lucero: Treating Kanoa Phillippi/Extender: Barbara Lucero in Treatment: 9 Active Problems Location of Pain Severity and Description of Pain Patient Has Paino No Site Locations Pain Management and Medication Current Pain Management: Electronic Signature(s) Signed: 10/30/2020 5:22:11 PM By: Barbara Mu RN Signed: 11/01/2020 11:10:38 AM By: Barbara Lucero Entered By: Barbara Lucero on 10/30/2020 13:31:14 -------------------------------------------------------------------------------- Patient/Caregiver Education Details Patient Name: Date of Service: Barbara Lucero 9/6/2022andnbsp1:15 PM Medical Record Number: [id number] Patient Account Number: [id number] Date of  Birth/Gender: Treating RN: 1940/03/08 (80 y.o. Barbara Lucero Primary Care Physician: Barbara Lucero Other Clinician: Referring Physician: Treating Physician/Extender: Barbara Lucero in Treatment: 9 Education Assessment Education Provided To: Patient Education Topics Provided Wound/Skin Impairment: Methods: Explain/Verbal Responses: State content correctly [employer]) Signed: 10/30/2020 5:42:35 PM By: Barbara Abts RN, BSN Entered By: Barbara Abts on 10/30/2020 17:32:25 -------------------------------------------------------------------------------- Vitals Details Patient Name: Date of Service: Barbara Sheets B. 10/30/2020 1:15 PM Medical Record Number: [id number] Patient Account Number: [id number] Date of Birth/Sex: Treating RN: Aug 24, 1940 (80 y.o. Barbara Lucero, Barbara Lucero Primary Care Emali Heyward: Barbara Lucero Other Clinician: Referring Barbara Lucero: Treating Schuyler Olden/Extender: Barbara Lucero in Treatment: 9 Vital Signs Time Taken: 13:30 Temperature (F): 98.0 Height (in): 63 Pulse (bpm): 74 Weight (lbs): 182 Respiratory Rate (breaths/min): 18 Body Mass Index (BMI): 32.2 Blood Pressure (mmHg): 174/75 Reference Range: 80 - 120 mg / dl Electronic Signature(s) Signed: 11/01/2020 11:10:38 AM By: Barbara Lucero Entered By: Barbara Lucero on 10/30/2020 13:31:09

## 2020-11-23 DIAGNOSIS — I10 Essential (primary) hypertension: Secondary | ICD-10-CM | POA: Diagnosis not present

## 2020-11-23 DIAGNOSIS — E1165 Type 2 diabetes mellitus with hyperglycemia: Secondary | ICD-10-CM | POA: Diagnosis not present

## 2020-12-04 ENCOUNTER — Other Ambulatory Visit: Payer: Self-pay

## 2020-12-04 ENCOUNTER — Encounter (HOSPITAL_BASED_OUTPATIENT_CLINIC_OR_DEPARTMENT_OTHER): Payer: Medicare Other | Attending: Internal Medicine | Admitting: Internal Medicine

## 2020-12-04 DIAGNOSIS — L03116 Cellulitis of left lower limb: Secondary | ICD-10-CM | POA: Diagnosis not present

## 2020-12-04 DIAGNOSIS — I872 Venous insufficiency (chronic) (peripheral): Secondary | ICD-10-CM | POA: Insufficient documentation

## 2020-12-04 DIAGNOSIS — I739 Peripheral vascular disease, unspecified: Secondary | ICD-10-CM | POA: Insufficient documentation

## 2020-12-04 DIAGNOSIS — I87333 Chronic venous hypertension (idiopathic) with ulcer and inflammation of bilateral lower extremity: Secondary | ICD-10-CM | POA: Diagnosis not present

## 2020-12-04 DIAGNOSIS — L97811 Non-pressure chronic ulcer of other part of right lower leg limited to breakdown of skin: Secondary | ICD-10-CM | POA: Diagnosis not present

## 2020-12-04 DIAGNOSIS — L97921 Non-pressure chronic ulcer of unspecified part of left lower leg limited to breakdown of skin: Secondary | ICD-10-CM | POA: Diagnosis not present

## 2020-12-04 DIAGNOSIS — L03115 Cellulitis of right lower limb: Secondary | ICD-10-CM | POA: Diagnosis not present

## 2020-12-04 DIAGNOSIS — Z86718 Personal history of other venous thrombosis and embolism: Secondary | ICD-10-CM | POA: Diagnosis not present

## 2020-12-04 DIAGNOSIS — K5090 Crohn's disease, unspecified, without complications: Secondary | ICD-10-CM | POA: Insufficient documentation

## 2020-12-04 DIAGNOSIS — I890 Lymphedema, not elsewhere classified: Secondary | ICD-10-CM

## 2020-12-04 DIAGNOSIS — I87331 Chronic venous hypertension (idiopathic) with ulcer and inflammation of right lower extremity: Secondary | ICD-10-CM

## 2020-12-04 NOTE — Progress Notes (Signed)
[name], [name] ([id number]) Visit Report for 12/04/2020 Chief Complaint Document Details Patient Name: Date of Service: Barbara Lucero 12/04/2020 1:00 PM Medical Record Number: [id number] Patient Account Number: [id number] Date of Birth/Sex: Treating RN: 06-25-1940 (80 y.o. Barbara Lucero, Barbara Lucero Primary Care Provider: [name] Other Clinician: Referring Provider: Treating Provider/Extender: Tomasita Crumble in Treatment: 14 Information Obtained from: Patient Chief Complaint 08/23/2020; this is a patient here for a weeping area on her right lateral lower leg just below the lateral malleolus 12/04/2020 patient presents with bilateral open wounds limited to skin breakdown and weeping area of right lower leg just below the lateral malleolus Electronic Signature(s) Signed: 12/04/2020 3:06:04 PM By: Geralyn Corwin DO Entered By: Geralyn Corwin on 12/04/2020 13:36:24 -------------------------------------------------------------------------------- HPI Details Patient Name: Date of Service: Barbara Lucero. 12/04/2020 1:00 PM Medical Record Number: [id number] Patient Account Number: [id number] Date of Birth/Sex: Treating RN: 1940/05/30 (80 y.o. Barbara Lucero Primary Care Provider: [name] Other Clinician: Referring Provider: Treating Provider/Extender: Tomasita Crumble in Treatment: 14 History of Present Illness HPI Description: ADMISSION 08/23/2020 This is a 80 year old woman who arrives accompanied by her sister-in-law. 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 She has been dealing with chronic edema and discoloration of her legs for several years per her sister-in-law. I note that she saw Dr. Myra Gianotti on 05/21/2020 and he noted an extensive left leg DVT in 2019 although she is no longer on oral anticoagulants. He felt she had chronic venous insufficiency with skin changes related to this. He ordered her 20/30 stockings which were zippered  stockings but I do not get the sense that the patient was all that compliant. In any case she started to develop weeping edema fluid recently coming out of the right lateral ankle serous drainage. She said it was pouring out last week. She saw her primary care provider on 08/08/2020 and was put on clindamycin which she is just finishing. She says the erythema feels better. Past medical history, aortic stenosis, chronic venous insufficiency, peripheral vascular disease, Crohn's disease which has been really recently quiescent, left hip fracture, DVT of the left leg in 2019. Notable that she has a very disabled son at home who she is the primary caregiver for Dr. Randie Heinz quoted an ABI of 1.12 on the right with a TBI of 0.65 on the left and 0.93 and a TBI of 0.58. In our clinic today the ABI was 0.83 on the right and 0.81 on the left 7/7 the patient's wound on the right lateral ankle is healed. Her edema control is much better. It turns out she does not have stockings but came in with Tubigrip on the left leg Readmission: 10/01/2020 on evaluation today patient appears to be doing decently well in regard to her leg. Unfortunately she does have an opening which happened as a result of her not wearing the compression socks she had something on the leg which was okay not necessarily the best but unfortunately did not include anything for the foot which is the main issue that we are finding here. With that being said I discussed with the patient that she really needs to have something for both and I think ordering her juxta lite compression wrap would be the ideal thing to do. 10/09/2020; patient I last saw in early July. At that point she had an area on the right lateral ankle which closed over. I am not really sure what happened however  she is readmitted to the clinic last week. She had an area on her right lateral ankle this is closed however she has another area on the right lateral foot also quite a bit of  discomfort on the right anterior foot. She has nothing open on the left leg. She comes in today with a single juxta lite stocking. She has a mid calf compression stocking she bought off [employer] on the left 10/16/2020; everything that we are concerned about on the right leg is healed including the right lateral ankle and right dorsal foot. She has another juxta light for the right leg, she had 1 on the left. The venous reflux studies I ordered last week are on Thursday. I will see her back 1 more time to review these. She had already been seen by Dr. Myra Gianotti 9/6; back in to discuss her reflux studies. This showed reflux in the greater saphenous vein on the right. I am going to send her back for Dr. Myra Gianotti to review these. She is wearing her juxta lite stockings but only had 20/30 mmHg compression. She became concerned this morning with erythema on the right leg. She says she has an appoint with Dr. Myra Gianotti in about 2 weeks 12/04/2020; patient presents after being discharged 1 month ago from our clinic. She states that she has been wearing her compression stockings intermittently. She has open wounds to her lower extremities bilaterally limited to skin breakdown. She has increased redness to her right foot and increased weeping to the right ankle area. She has increased pain to the right lower extremity as well. She has not followed up with vein and vascular since discharge from our clinic last month as recommended. Electronic Signature(s) Signed: 12/04/2020 3:06:04 PM By: Geralyn Corwin DO Entered By: Geralyn Corwin on 12/04/2020 15:01:51 -------------------------------------------------------------------------------- Physical Exam Details Patient Name: Date of Service: Barbara Lucero. 12/04/2020 1:00 PM Medical Record Number: [id number] Patient Account Number: [id number] Date of Birth/Sex: Treating RN: Dec 25, 1940 (80 y.o. Barbara Lucero Primary Care Provider: [name] Other  Clinician: Referring Provider: Treating Provider/Extender: Tomasita Crumble in Treatment: 14 Constitutional respirations regular, non-labored and within target range for patient.. Cardiovascular 2+ dorsalis pedis/posterior tibialis pulses. Psychiatric pleasant and cooperative. Notes Bilateral lower extremity open wounds limited to skin breakdown. She has increased redness and warmth to her right foot and ankle area. 2+ pitting edema to the knee bilaterally. Electronic Signature(s) Signed: 12/04/2020 3:06:04 PM By: Geralyn Corwin DO Entered By: Geralyn Corwin on 12/04/2020 15:03:00 -------------------------------------------------------------------------------- Physician Orders Details Patient Name: Date of Service: Barbara Lucero. 12/04/2020 1:00 PM Medical Record Number: [id number] Patient Account Number: [id number] Date of Birth/Sex: Treating RN: 1940-03-21 (80 y.o. Billy Coast, Linda Primary Care Provider: [name] Other Clinician: Referring Provider: Treating Provider/Extender: Tomasita Crumble in Treatment: [phone] Verbal / Phone Orders: No Diagnosis Coding ICD-10 Coding Code Description I87.331 Chronic venous hypertension (idiopathic) with ulcer and inflammation of right lower extremity L97.811 Non-pressure chronic ulcer of other part of right lower leg limited to breakdown of skin I87.332 Chronic venous hypertension (idiopathic) with ulcer and inflammation of left lower extremity L97.921 Non-pressure chronic ulcer of unspecified part of left lower leg limited to breakdown of skin I89.0 Lymphedema, not elsewhere classified L03.115 Cellulitis of right lower limb Follow-up Appointments ppointment in 1 week. - with Dr. Leanord Hawking Return A Bathing/ Shower/ Hygiene May shower with protection but do not get wound dressing(s) wet. Edema Control - Lymphedema / SCD / Other  Bilateral Lower Extremities Elevate legs to the level of the heart or  above for 30 minutes daily and/or when sitting, a frequency of: - throughout the day Avoid standing for long periods of time. Exercise regularly Wound Treatment Wound #3 - Lower Leg Wound Laterality: Left, Medial Peri-Wound Care: Triamcinolone 15 (g) 1 x Per Week/30 Days Discharge Instructions: Use triamcinolone 15 (g) as directed Peri-Wound Care: Sween Lotion (Moisturizing lotion) 1 x Per Week/30 Days Discharge Instructions: Apply moisturizing lotion as directed Prim Dressing: KerraCel Ag Gelling Fiber Dressing, 2x2 in (silver alginate) 1 x Per Week/30 Days ary Discharge Instructions: Apply silver alginate to wound bed as instructed Secondary Dressing: Woven Gauze Sponge, Non-Sterile 4x4 in 1 x Per Week/30 Days Discharge Instructions: Apply over primary dressing as directed. Secondary Dressing: ABD Pad, 8x10 1 x Per Week/30 Days Discharge Instructions: Apply over primary dressing as directed. Compression Wrap: Kerlix Roll 4.5x3.1 (in/yd) 1 x Per Week/30 Days Discharge Instructions: Apply Kerlix and Coban compression as directed. Compression Wrap: Coban Self-Adherent Wrap 4x5 (in/yd) 1 x Per Week/30 Days Discharge Instructions: Apply over Kerlix as directed. Wound #4 - Ankle Wound Laterality: Left, Lateral Peri-Wound Care: Triamcinolone 15 (g) 1 x Per Week/30 Days Discharge Instructions: Use triamcinolone 15 (g) as directed Peri-Wound Care: Sween Lotion (Moisturizing lotion) 1 x Per Week/30 Days Discharge Instructions: Apply moisturizing lotion as directed Prim Dressing: KerraCel Ag Gelling Fiber Dressing, 2x2 in (silver alginate) 1 x Per Week/30 Days ary Discharge Instructions: Apply silver alginate to wound bed as instructed Secondary Dressing: Woven Gauze Sponge, Non-Sterile 4x4 in 1 x Per Week/30 Days Discharge Instructions: Apply over primary dressing as directed. Secondary Dressing: ABD Pad, 8x10 1 x Per Week/30 Days Discharge Instructions: Apply over primary dressing as  directed. Compression Wrap: Kerlix Roll 4.5x3.1 (in/yd) 1 x Per Week/30 Days Discharge Instructions: Apply Kerlix and Coban compression as directed. Compression Wrap: Coban Self-Adherent Wrap 4x5 (in/yd) 1 x Per Week/30 Days Discharge Instructions: Apply over Kerlix as directed. Wound #5 - Lower Leg Wound Laterality: Right, Anterior Peri-Wound Care: Triamcinolone 15 (g) 1 x Per Week/30 Days Discharge Instructions: Use triamcinolone 15 (g) as directed Peri-Wound Care: Sween Lotion (Moisturizing lotion) 1 x Per Week/30 Days Discharge Instructions: Apply moisturizing lotion as directed Prim Dressing: KerraCel Ag Gelling Fiber Dressing, 2x2 in (silver alginate) 1 x Per Week/30 Days ary Discharge Instructions: Apply silver alginate to wound bed as instructed Secondary Dressing: Woven Gauze Sponge, Non-Sterile 4x4 in 1 x Per Week/30 Days Discharge Instructions: Apply over primary dressing as directed. Secondary Dressing: ABD Pad, 8x10 1 x Per Week/30 Days Discharge Instructions: Apply over primary dressing as directed. Compression Wrap: Kerlix Roll 4.5x3.1 (in/yd) 1 x Per Week/30 Days Discharge Instructions: Apply Kerlix and Coban compression as directed. Compression Wrap: Coban Self-Adherent Wrap 4x5 (in/yd) 1 x Per Week/30 Days Discharge Instructions: Apply over Kerlix as directed. Wound #6 - Ankle Wound Laterality: Right, Lateral Peri-Wound Care: Triamcinolone 15 (g) 1 x Per Week/30 Days Discharge Instructions: Use triamcinolone 15 (g) as directed Peri-Wound Care: Sween Lotion (Moisturizing lotion) 1 x Per Week/30 Days Discharge Instructions: Apply moisturizing lotion as directed Prim Dressing: KerraCel Ag Gelling Fiber Dressing, 2x2 in (silver alginate) 1 x Per Week/30 Days ary Discharge Instructions: Apply silver alginate to wound bed as instructed Secondary Dressing: Woven Gauze Sponge, Non-Sterile 4x4 in 1 x Per Week/30 Days Discharge Instructions: Apply over primary dressing as  directed. Secondary Dressing: ABD Pad, 8x10 1 x Per Week/30 Days Discharge Instructions: Apply over primary dressing as directed.  Compression Wrap: Kerlix Roll 4.5x3.1 (in/yd) 1 x Per Week/30 Days Discharge Instructions: Apply Kerlix and Coban compression as directed. Compression Wrap: Coban Self-Adherent Wrap 4x5 (in/yd) 1 x Per Week/30 Days Discharge Instructions: Apply over Kerlix as directed. Patient Medications llergies: coconut, Latex, Natural Rubber A Notifications Medication Indication Start End Keflex DOSE 1 - oral 500 mg capsule - 1 capsule oral q6h for 7 days Electronic Signature(s) Signed: 12/04/2020 3:06:04 PM By: Geralyn Corwin DO Previous Signature: 12/04/2020 1:44:44 PM Version By: Geralyn Corwin DO Entered By: Geralyn Corwin on 12/04/2020 15:03:21 -------------------------------------------------------------------------------- Problem List Details Patient Name: Date of Service: Barbara Lucero. 12/04/2020 1:00 PM Medical Record Number: [id number] Patient Account Number: [id number] Date of Birth/Sex: Treating RN: [birth year] (80 y.o. Barbara Lucero, [name] Primary Care Provider: [name] Other Clinician: Referring Provider: Treating Provider/Extender: Tomasita Crumble in Treatment: 14 Active Problems ICD-10 Encounter Code Description Active Date MDM Diagnosis I87.331 Chronic venous hypertension (idiopathic) with ulcer and inflammation of right 08/23/2020 No Yes lower extremity L97.811 Non-pressure chronic ulcer of other part of right lower leg limited to breakdown 08/23/2020 No Yes of skin I87.332 Chronic venous hypertension (idiopathic) with ulcer and inflammation of left 12/04/2020 No Yes lower extremity L97.921 Non-pressure chronic ulcer of unspecified part of left lower leg limited to 12/04/2020 No Yes breakdown of skin I89.0 Lymphedema, not elsewhere classified 08/23/2020 No Yes L03.115 Cellulitis of right lower limb 10/09/2020 No  Yes Inactive Problems Resolved Problems Electronic Signature(s) Signed: 12/04/2020 3:06:04 PM By: Geralyn Corwin DO Entered By: Geralyn Corwin on 12/04/2020 13:34:57 -------------------------------------------------------------------------------- Progress Note Details Patient Name: Date of Service: Barbara Lucero. 12/04/2020 1:00 PM Medical Record Number: [id number] Patient Account Number: [id number] Date of Birth/Sex: Treating RN: 29-Nov-1940 (80 y.o. Barbara Lucero, [name] Primary Care Provider: [name] Other Clinician: Referring Provider: Treating Provider/Extender: Tomasita Crumble in Treatment: 14 Subjective Chief Complaint Information obtained from Patient 08/23/2020; this is a patient here for a weeping area on her right lateral lower leg just below the lateral malleolus 12/04/2020 patient presents with bilateral open wounds limited to skin breakdown and weeping area of right lower leg just below the lateral malleolus History of Present Illness (HPI) ADMISSION 08/23/2020 This is a 80 year old woman who arrives accompanied by her sister-in-law. 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 She has been dealing with chronic edema and discoloration of her legs for several years per her sister-in-law. I note that she saw Dr. Myra Gianotti on 05/21/2020 and he noted an extensive left leg DVT in 2019 although she is no longer on oral anticoagulants. He felt she had chronic venous insufficiency with skin changes related to this. He ordered her 20/30 stockings which were zippered stockings but I do not get the sense that the patient was all that compliant. In any case she started to develop weeping edema fluid recently coming out of the right lateral ankle serous drainage. She said it was pouring out last week. She saw her primary care provider on 08/08/2020 and was put on clindamycin which she is just finishing. She says the erythema feels better. Past medical history, aortic  stenosis, chronic venous insufficiency, peripheral vascular disease, Crohn's disease which has been really recently quiescent, left hip fracture, DVT of the left leg in 2019. Notable that she has a very disabled son at home who she is the primary caregiver for Dr. Randie Heinz quoted an ABI of 1.12 on the right with a TBI of 0.65 on the  left and 0.93 and a TBI of 0.58. In our clinic today the ABI was 0.83 on the right and 0.81 on the left 7/7 the patient's wound on the right lateral ankle is healed. Her edema control is much better. It turns out she does not have stockings but came in with Tubigrip on the left leg Readmission: 10/01/2020 on evaluation today patient appears to be doing decently well in regard to her leg. Unfortunately she does have an opening which happened as a result of her not wearing the compression socks she had something on the leg which was okay not necessarily the best but unfortunately did not include anything for the foot which is the main issue that we are finding here. With that being said I discussed with the patient that she really needs to have something for both and I think ordering her juxta lite compression wrap would be the ideal thing to do. 10/09/2020; patient I last saw in early July. At that point she had an area on the right lateral ankle which closed over. I am not really sure what happened however she is readmitted to the clinic last week. She had an area on her right lateral ankle this is closed however she has another area on the right lateral foot also quite a bit of discomfort on the right anterior foot. She has nothing open on the left leg. She comes in today with a single juxta lite stocking. She has a mid calf compression stocking she bought off [employer] on the left 10/16/2020; everything that we are concerned about on the right leg is healed including the right lateral ankle and right dorsal foot. She has another juxta light for the right leg, she had 1 on the  left. The venous reflux studies I ordered last week are on Thursday. I will see her back 1 more time to review these. She had already been seen by Dr. Myra Gianotti 9/6; back in to discuss her reflux studies. This showed reflux in the greater saphenous vein on the right. I am going to send her back for Dr. Myra Gianotti to review these. She is wearing her juxta lite stockings but only had 20/30 mmHg compression. She became concerned this morning with erythema on the right leg. She says she has an appoint with Dr. Myra Gianotti in about 2 weeks 12/04/2020; patient presents after being discharged 1 month ago from our clinic. She states that she has been wearing her compression stockings intermittently. She has open wounds to her lower extremities bilaterally limited to skin breakdown. She has increased redness to her right foot and increased weeping to the right ankle area. She has increased pain to the right lower extremity as well. She has not followed up with vein and vascular since discharge from our clinic last month as recommended. Patient History Information obtained from Patient. Family History Cancer - Siblings, Diabetes - Mother, Heart Disease - Mother,Father, Hypertension - Mother,Father, Stroke - Mother, Thyroid Problems - Siblings, No family history of Hereditary Spherocytosis, Kidney Disease, Lung Disease, Seizures, Tuberculosis. Social History Never smoker, Marital Status - Widowed, Alcohol Use - Never, Drug Use - No History, Caffeine Use - Rarely. Medical History Eyes Patient has history of Cataracts - bil removed Ear/Nose/Mouth/Throat Denies history of Chronic sinus problems/congestion, Middle ear problems Cardiovascular Patient has history of Deep Vein Thrombosis - left, Peripheral Venous Disease Gastrointestinal Patient has history of Crohnoos Integumentary (Skin) Denies history of History of Burn Musculoskeletal Patient has history of Osteoarthritis Oncologic Denies history of  Received  Chemotherapy, Received Radiation Psychiatric Patient has history of Confinement Anxiety - mild Denies history of Anorexia/bulimia Hospitalization/Surgery History - left hip repair. - tubal ligation. Medical A Surgical History Notes nd Ear/Nose/Mouth/Throat occaissional dysphagia Cardiovascular aortic valve stenosis Gastrointestinal Genella Rife Genitourinary stress incontinence Musculoskeletal chronic back pain Objective Constitutional respirations regular, non-labored and within target range for patient.. Vitals Time Taken: 12:58 PM, Height: 63 in, Source: Stated, Weight: 182 lbs, Source: Stated, BMI: 32.2, Temperature: 97.8 F, Pulse: 81 bpm, Respiratory Rate: 18 breaths/min, Blood Pressure: 181/75 mmHg. Cardiovascular 2+ dorsalis pedis/posterior tibialis pulses. Psychiatric pleasant and cooperative. General Notes: Bilateral lower extremity open wounds limited to skin breakdown. She has increased redness and warmth to her right foot and ankle area. 2+ pitting edema to the knee bilaterally. Integumentary (Hair, Skin) Wound #3 status is Open. Original cause of wound was Blister. The date acquired was: 11/26/2020. The wound is located on the Left,Medial Lower Leg. The wound measures 1cm length x 1cm width x 0.1cm depth; 0.785cm^2 area and 0.079cm^3 volume. There is Fat Layer (Subcutaneous Tissue) exposed. There is no tunneling or undermining noted. There is a medium amount of serous drainage noted. The wound margin is flat and intact. There is large (67-100%) red granulation within the wound bed. There is no necrotic tissue within the wound bed. Wound #4 status is Open. Original cause of wound was Blister. The date acquired was: 11/26/2020. The wound is located on the Left,Lateral Ankle. The wound measures 0.5cm length x 0.4cm width x 0.1cm depth; 0.157cm^2 area and 0.016cm^3 volume. There is Fat Layer (Subcutaneous Tissue) exposed. There is no tunneling or undermining noted. There is a  medium amount of serous drainage noted. The wound margin is flat and intact. There is large (67-100%) pink granulation within the wound bed. Wound #4 status is Open. Original cause of wound was Blister. The date acquired was: 11/26/2020. The wound is located on the Left,Lateral Ankle. The wound measures 0.5cm length x 0.4cm width x 0.1cm depth; 0.157cm^2 area and 0.016cm^3 volume. There is Fat Layer (Subcutaneous Tissue) exposed. There is no tunneling or undermining noted. There is a medium amount of serous drainage noted. The wound margin is flat and intact. There is large (67-100%) pink granulation within the wound bed. Wound #5 status is Open. Original cause of wound was Gradually Appeared. The date acquired was: 11/26/2020. The wound is located on the Right,Anterior Lower Leg. The wound measures 0.7cm length x 1cm width x 0.1cm depth; 0.55cm^2 area and 0.055cm^3 volume. There is Fat Layer (Subcutaneous Tissue) exposed. There is no tunneling or undermining noted. There is a medium amount of serous drainage noted. The wound margin is flat and intact. There is large (67-100%) red granulation within the wound bed. There is no necrotic tissue within the wound bed. Wound #6 status is Open. Original cause of wound was Gradually Appeared. The date acquired was: 11/26/2020. The wound is located on the Right,Lateral Ankle. The wound measures 1.2cm length x 2.1cm width x 0.1cm depth; 1.979cm^2 area and 0.198cm^3 volume. There is Fat Layer (Subcutaneous Tissue) exposed. There is no tunneling or undermining noted. There is a large amount of serous drainage noted. The wound margin is indistinct and nonvisible. There is small (1-33%) pink granulation within the wound bed. There is no necrotic tissue within the wound bed. Assessment Active Problems ICD-10 Chronic venous hypertension (idiopathic) with ulcer and inflammation of right lower extremity Non-pressure chronic ulcer of other part of right lower leg  limited to breakdown of skin Chronic  venous hypertension (idiopathic) with ulcer and inflammation of left lower extremity Non-pressure chronic ulcer of unspecified part of left lower leg limited to breakdown of skin Lymphedema, not elsewhere classified Cellulitis of right lower limb Patient presents after being discharged 1 month ago. It is unclear if she is wearing her compression stockings. She now has scattered open wounds to her lower extremities bilaterally limited to skin breakdown. I recommended silver alginate to these. Her right lower extremity appears more erythematous and warm compared to her left lower extremity. We will treat for cellulitis with Keflex. We will also do light compression with Kerlix/Coban. Follow-up in 1 week. Of note patient has an appointment with vein and vascular on 10/24. Plan Follow-up Appointments: Return Appointment in 1 week. - with Dr. Chauncey Mann Shower/ Hygiene: May shower with protection but do not get wound dressing(s) wet. Edema Control - Lymphedema / SCD / Other: Elevate legs to the level of the heart or above for 30 minutes daily and/or when sitting, a frequency of: - throughout the day Avoid standing for long periods of time. Exercise regularly The following medication(s) was prescribed: Keflex oral 500 mg capsule 1 1 capsule oral q6h for 7 days WOUND #3: - Lower Leg Wound Laterality: Left, Medial Peri-Wound Care: Triamcinolone 15 (g) 1 x Per Week/30 Days Discharge Instructions: Use triamcinolone 15 (g) as directed Peri-Wound Care: Sween Lotion (Moisturizing lotion) 1 x Per Week/30 Days Discharge Instructions: Apply moisturizing lotion as directed Prim Dressing: KerraCel Ag Gelling Fiber Dressing, 2x2 in (silver alginate) 1 x Per Week/30 Days ary Discharge Instructions: Apply silver alginate to wound bed as instructed Secondary Dressing: Woven Gauze Sponge, Non-Sterile 4x4 in 1 x Per Week/30 Days Discharge Instructions: Apply over primary  dressing as directed. Secondary Dressing: ABD Pad, 8x10 1 x Per Week/30 Days Discharge Instructions: Apply over primary dressing as directed. Com pression Wrap: Kerlix Roll 4.5x3.1 (in/yd) 1 x Per Week/30 Days Discharge Instructions: Apply Kerlix and Coban compression as directed. Com pression Wrap: Coban Self-Adherent Wrap 4x5 (in/yd) 1 x Per Week/30 Days Discharge Instructions: Apply over Kerlix as directed. WOUND #4: - Ankle Wound Laterality: Left, Lateral Peri-Wound Care: Triamcinolone 15 (g) 1 x Per Week/30 Days Discharge Instructions: Use triamcinolone 15 (g) as directed Peri-Wound Care: Sween Lotion (Moisturizing lotion) 1 x Per Week/30 Days Discharge Instructions: Apply moisturizing lotion as directed Prim Dressing: KerraCel Ag Gelling Fiber Dressing, 2x2 in (silver alginate) 1 x Per Week/30 Days ary Discharge Instructions: Apply silver alginate to wound bed as instructed Secondary Dressing: Woven Gauze Sponge, Non-Sterile 4x4 in 1 x Per Week/30 Days Discharge Instructions: Apply over primary dressing as directed. Secondary Dressing: ABD Pad, 8x10 1 x Per Week/30 Days Discharge Instructions: Apply over primary dressing as directed. Com pression Wrap: Kerlix Roll 4.5x3.1 (in/yd) 1 x Per Week/30 Days Discharge Instructions: Apply Kerlix and Coban compression as directed. Com pression Wrap: Coban Self-Adherent Wrap 4x5 (in/yd) 1 x Per Week/30 Days Discharge Instructions: Apply over Kerlix as directed. WOUND #5: - Lower Leg Wound Laterality: Right, Anterior Peri-Wound Care: Triamcinolone 15 (g) 1 x Per Week/30 Days Discharge Instructions: Use triamcinolone 15 (g) as directed Peri-Wound Care: Sween Lotion (Moisturizing lotion) 1 x Per Week/30 Days Discharge Instructions: Apply moisturizing lotion as directed Prim Dressing: KerraCel Ag Gelling Fiber Dressing, 2x2 in (silver alginate) 1 x Per Week/30 Days ary Discharge Instructions: Apply silver alginate to wound bed as  instructed Secondary Dressing: Woven Gauze Sponge, Non-Sterile 4x4 in 1 x Per Week/30 Days Discharge Instructions: Apply over primary  dressing as directed. Secondary Dressing: ABD Pad, 8x10 1 x Per Week/30 Days Discharge Instructions: Apply over primary dressing as directed. Com pression Wrap: Kerlix Roll 4.5x3.1 (in/yd) 1 x Per Week/30 Days Discharge Instructions: Apply Kerlix and Coban compression as directed. Com pression Wrap: Coban Self-Adherent Wrap 4x5 (in/yd) 1 x Per Week/30 Days Discharge Instructions: Apply over Kerlix as directed. WOUND #6: - Ankle Wound Laterality: Right, Lateral Peri-Wound Care: Triamcinolone 15 (g) 1 x Per Week/30 Days Discharge Instructions: Use triamcinolone 15 (g) as directed Peri-Wound Care: Sween Lotion (Moisturizing lotion) 1 x Per Week/30 Days Discharge Instructions: Apply moisturizing lotion as directed Prim Dressing: KerraCel Ag Gelling Fiber Dressing, 2x2 in (silver alginate) 1 x Per Week/30 Days ary Discharge Instructions: Apply silver alginate to wound bed as instructed Secondary Dressing: Woven Gauze Sponge, Non-Sterile 4x4 in 1 x Per Week/30 Days Discharge Instructions: Apply over primary dressing as directed. Secondary Dressing: ABD Pad, 8x10 1 x Per Week/30 Days Discharge Instructions: Apply over primary dressing as directed. Com pression Wrap: Kerlix Roll 4.5x3.1 (in/yd) 1 x Per Week/30 Days Discharge Instructions: Apply Kerlix and Coban compression as directed. Com pression Wrap: Coban Self-Adherent Wrap 4x5 (in/yd) 1 x Per Week/30 Days Discharge Instructions: Apply over Kerlix as directed. 1. Keflex 2. Silver alginate with Kerlix/Coban 3. Follow-up in 1 week Electronic Signature(s) Signed: 12/04/2020 3:06:04 PM By: Geralyn Corwin DO Entered By: Geralyn Corwin on 12/04/2020 15:05:33 -------------------------------------------------------------------------------- HxROS Details Patient Name: Date of Service: Barbara Lucero.  12/04/2020 1:00 PM Medical Record Number: [id number] Patient Account Number: [id number] Date of Birth/Sex: Treating RN: April 09, 1940 (80 y.o. Barbara Lucero Primary Care Provider: [name] Other Clinician: Referring Provider: Treating Provider/Extender: Tomasita Crumble in Treatment: 14 Information Obtained From Patient Eyes Medical History: Positive for: Cataracts - bil removed Ear/Nose/Mouth/Throat Medical History: Negative for: Chronic sinus problems/congestion; Middle ear problems Past Medical History Notes: occaissional dysphagia Cardiovascular Medical History: Positive for: Deep Vein Thrombosis - left; Peripheral Venous Disease Past Medical History Notes: aortic valve stenosis Gastrointestinal Medical History: Positive for: Crohns Past Medical History Notes: Gerd Genitourinary Medical History: Past Medical History Notes: stress incontinence Integumentary (Skin) Medical History: Negative for: History of Burn Musculoskeletal Medical History: Positive for: Osteoarthritis Past Medical History Notes: chronic back pain Oncologic Medical History: Negative for: Received Chemotherapy; Received Radiation Psychiatric Medical History: Positive for: Confinement Anxiety - mild Negative for: Anorexia/bulimia HBO Extended History Items Eyes: Cataracts Immunizations Pneumococcal Vaccine: Received Pneumococcal Vaccination: Yes Received Pneumococcal Vaccination On or After 60th Birthday: No Implantable Devices No devices added Hospitalization / Surgery History Type of Hospitalization/Surgery left hip repair tubal ligation Family and Social History Cancer: Yes - Siblings; Diabetes: Yes - Mother; Heart Disease: Yes - Mother,Father; Hereditary Spherocytosis: No; Hypertension: Yes - Mother,Father; Kidney Disease: No; Lung Disease: No; Seizures: No; Stroke: Yes - Mother; Thyroid Problems: Yes - Siblings; Tuberculosis: No; Never smoker; Marital Status -  Widowed; Alcohol Use: Never; Drug Use: No History; Caffeine Use: Rarely; Financial Concerns: No; Food, Clothing or Shelter Needs: No; Support System Lacking: No; Transportation Concerns: No Electronic Signature(s) Signed: 12/04/2020 3:06:04 PM By: Geralyn Corwin DO Signed: 12/04/2020 5:07:29 PM By: Shawn Stall RN, BSN Entered By: Geralyn Corwin on 12/04/2020 15:01:58 -------------------------------------------------------------------------------- SuperBill Details Patient Name: Date of Service: Barbara Lucero. 12/04/2020 Medical Record Number: [id number] Patient Account Number: [id number] Date of Birth/Sex: Treating RN: May 18, 1940 (80 y.o. Tommye Standard Primary Care Provider: [name] Other Clinician: Referring Provider: Treating Provider/Extender: Tomasita Crumble  in Treatment: 14 Diagnosis Coding ICD-10 Codes Code Description I87.331 Chronic venous hypertension (idiopathic) with ulcer and inflammation of right lower extremity I89.0 Lymphedema, not elsewhere classified L97.811 Non-pressure chronic ulcer of other part of right lower leg limited to breakdown of skin L03.115 Cellulitis of right lower limb Facility Procedures CPT4 Code: 16109604 Description: 54098 - WOUND CARE VISIT-LEV 5 EST PT Modifier: Quantity: 1 Physician Procedures : CPT4 Code Description Modifier [id number] - WC PHYS LEVEL 4 - EST PT ICD-10 Diagnosis Description I87.331 Chronic venous hypertension (idiopathic) with ulcer and inflammation of right lower extremity I89.0 Lymphedema, not elsewhere classified  L97.811 Non-pressure chronic ulcer of other part of right lower leg limited to breakdown of skin L03.115 Cellulitis of right lower limb Quantity: 1 Electronic Signature(s) Signed: 12/04/2020 3:06:04 PM By: Geralyn Corwin DO Entered By: Geralyn Corwin on 12/04/2020 15:05:46

## 2020-12-04 NOTE — Progress Notes (Signed)
ERMIE, GLENDENNING ([id number]) Visit Report for 12/04/2020 Arrival Information Details Patient Name: Date of Service: Barbara Lucero 12/04/2020 1:00 PM Medical Record Number: [id number] Patient Account Number: [id number] Date of Birth/Sex: Treating RN: 1940/12/11 (80 y.o. Tommye Standard Primary Care Ollen Rao: [name] Other Clinician: Referring [place] Kuang: Treating Chrisangel Eskenazi/Extender: Tomasita Crumble in Treatment: 14 Visit Information History Since Last Visit Added or deleted any medications: No Patient Arrived: [name] Any new allergies or adverse reactions: No Arrival Time: 12:52 Had a fall or experienced change in No Accompanied By: sister in law activities of daily living that may affect Transfer Assistance: None risk of falls: Patient Identification Verified: Yes Signs or symptoms of abuse/neglect since last visito No Secondary Verification Process Completed: Yes Hospitalized since last visit: No Patient Requires Transmission-Based Precautions: No Implantable device outside of the clinic excluding No Patient Has Alerts: No cellular tissue based products placed in the center since last visit: Pain Present Now: Yes Electronic Signature(s) Signed: 12/04/2020 5:15:16 PM By: Zenaida Deed RN, BSN Entered By: Zenaida Deed on 12/04/2020 12:55:53 -------------------------------------------------------------------------------- Clinic Level of Care Assessment Details Patient Name: Date of Service: Barbara Lucero 12/04/2020 1:00 PM Medical Record Number: [id number] Patient Account Number: [id number] Date of Birth/Sex: Treating RN: September 01, 1940 (80 y.o. Billy Coast, Linda Primary Care Robecca Fulgham: [name] Other Clinician: Referring Christelle Igoe: Treating Hydie Langan/Extender: Tomasita Crumble in Treatment: 14 Clinic Level of Care Assessment Items TOOL 4 Quantity Score []  - 0 Use when only an EandM is performed on FOLLOW-UP visit ASSESSMENTS -  Nursing Assessment / Reassessment X- 1 10 Reassessment of Co-morbidities (includes updates in patient status) X- 1 5 Reassessment of Adherence to Treatment Plan ASSESSMENTS - Wound and Skin A ssessment / Reassessment []  - 0 Simple Wound Assessment / Reassessment - one wound X- 4 5 Complex Wound Assessment / Reassessment - multiple wounds []  - 0 Dermatologic / Skin Assessment (not related to wound area) ASSESSMENTS - Focused Assessment X- 2 5 Circumferential Edema Measurements - multi extremities []  - 0 Nutritional Assessment / Counseling / Intervention X- 1 5 Lower Extremity Assessment (monofilament, tuning fork, pulses) []  - 0 Peripheral Arterial Disease Assessment (using hand held doppler) ASSESSMENTS - Ostomy and/or Continence Assessment and Care []  - 0 Incontinence Assessment and Management []  - 0 Ostomy Care Assessment and Management (repouching, etc.) PROCESS - Coordination of Care X - Simple Patient / Family Education for ongoing care 1 15 []  - 0 Complex (extensive) Patient / Family Education for ongoing care X- 1 10 Staff obtains , Records, T Results / Process Orders est []  - 0 Staff telephones HHA, Nursing Homes / Clarify orders / etc []  - 0 Routine Transfer to another Facility (non-emergent condition) []  - 0 Routine Hospital Admission (non-emergent condition) []  - 0 New Admissions / / Ordering NPWT Apligraf, etc. , []  - 0 Emergency Hospital Admission (emergent condition) X- 1 10 Simple Discharge Coordination []  - 0 Complex (extensive) Discharge Coordination PROCESS - Special Needs []  - 0 Pediatric / Minor Patient Management []  - 0 Isolation Patient Management []  - 0 Hearing / Language / Visual special needs []  - 0 Assessment of Community assistance (transportation, D/C planning, etc.) []  - 0 Additional assistance / Altered mentation []  - 0 Support Surface(s) Assessment (bed, cushion, seat, etc.) INTERVENTIONS -  Wound Cleansing / Measurement []  - 0 Simple Wound Cleansing - one wound X- 4 5 Complex Wound Cleansing - multiple wounds X- 1 5 Wound Imaging (  photographs - any number of wounds) []  - 0 Wound Tracing (instead of photographs) []  - 0 Simple Wound Measurement - one wound X- 4 5 Complex Wound Measurement - multiple wounds INTERVENTIONS - Wound Dressings []  - 0 Small Wound Dressing one or multiple wounds X- 2 15 Medium Wound Dressing one or multiple wounds []  - 0 Large Wound Dressing one or multiple wounds X- 1 5 Application of Medications - topical []  - 0 Application of Medications - injection INTERVENTIONS - Miscellaneous []  - 0 External ear exam []  - 0 Specimen Collection (cultures, biopsies, blood, body fluids, etc.) []  - 0 Specimen(s) / Culture(s) sent or taken to Lab for analysis []  - 0 Patient Transfer (multiple staff / / Similar devices) []  - 0 Simple Staple / Suture removal (25 or less) []  - 0 Complex Staple / Suture removal (26 or more) []  - 0 Hypo / Hyperglycemic Management (close monitor of Blood Glucose) []  - 0 Ankle / Brachial Index (ABI) - do not check if billed separately X- 1 5 Vital Signs Has the patient been seen at the hospital within the last three years: Yes Total Score: 170 Level Of Care: New/Established - Level 5 Electronic Signature(s) Signed: 12/04/2020 5:15:16 PM By: RN, BSN Entered By: on 12/04/2020 13:28:12 -------------------------------------------------------------------------------- Encounter Discharge Information Details Patient Name: Date of Service: B. 12/04/2020 1:00 PM Medical Record Number: Patient Account Number: Date of Birth/Sex: Treating RN: 03-22-1940 (80 y.o. Primary Care Bartlomiej Jenkinson: Other Clinician: Referring Denton Derks: Treating Yasenia Reedy/Extender: in Treatment: 14 Encounter  Discharge Information Items Discharge Condition: Stable Ambulatory Status: Walker Discharge Destination: Home Transportation: Private Auto Accompanied By: sister in law Schedule Follow-up Appointment: Yes Clinical Summary of Care: Patient Declined Electronic Signature(s) Signed: 12/04/2020 5:15:16 PM By: Zenaida Deed RN, BSN Entered By: Zenaida Deed on 12/04/2020 13:43:14 -------------------------------------------------------------------------------- Lower Extremity Assessment Details Patient Name: Date of Service: Barbara Sheets B. 12/04/2020 1:00 PM Medical Record Number: [id number] Patient Account Number: [id number] Date of Birth/Sex: Treating RN: 1940/03/28 (80 y.o. Tommye Standard Primary Care Joesph Marcy: [name] Other Clinician: Referring Pius Byrom: Treating Marvetta Vohs/Extender: Tomasita Crumble in Treatment: 14 Edema Assessment Assessed: [Left: No] [Right: No] Edema: [Left: Yes] [Right: Yes] Calf Left: Right: Point of Measurement: From Medial Instep 30.5 cm 36.3 cm Ankle Left: Right: Point of Measurement: From Medial Instep 22.1 cm 20.8 cm Vascular Assessment Pulses: Dorsalis Pedis Palpable: [Left:Yes] [Right:Yes] Electronic Signature(s) Signed: 12/04/2020 5:15:16 PM By: Zenaida Deed RN, BSN Entered By: Zenaida Deed on 12/04/2020 13:06:13 -------------------------------------------------------------------------------- Multi Wound Chart Details Patient Name: Date of Service: Barbara Sheets B. 12/04/2020 1:00 PM Medical Record Number: [id number] Patient Account Number: [id number] Date of Birth/Sex: Treating RN: 12/22/1940 (81 y.o. Tommye Standard, [name] Primary Care Rocklyn Mayberry: Tomasita Crumble Other Clinician: Referring Vicie Cech: Treating Janeene Sand/Extender: 02/03/2021 in Treatment: 14 Vital Signs Height(in): 63 Pulse(bpm): 81 Weight(lbs): 182 Blood Pressure(mmHg): 181/75 Body Mass Index(BMI): 32 Temperature(F):  97.8 Respiratory Rate(breaths/min): 18 Photos: [4:No Photos] Left, Medial Lower Leg Left, Lateral Ankle Left, Lateral Ankle Wound Location: Blister Blister Blister Wounding Event: Venous Leg Ulcer Venous Leg Ulcer Venous Leg Ulcer Primary Etiology: Lymphedema N/A N/A Secondary Etiology: Cataracts, Deep Vein Thrombosis, Cataracts, Deep Vein Thrombosis, Cataracts, Deep Vein Thrombosis, Comorbid History: Peripheral Venous Disease, Crohns, Peripheral Venous Disease, Crohns, Peripheral Venous Disease, Crohns, Osteoarthritis, Confinement Anxiety Osteoarthritis, Confinement Anxiety Osteoarthritis, Confinement Anxiety 11/26/2020 11/26/2020  11/26/2020 Date Acquired: 0 0 0 Weeks of Treatment: Open Open Open Wound Status: 1x1x0.1 0.5x0.4x0.1 0.5x0.4x0.1 Measurements L x W x D (cm) 0.785 0.157 0.157 A (cm) : rea 0.079 0.016 0.016 Volume (cm) : 0.00% 0.00% 0.00% % Reduction in Area: 0.00% 0.00% 0.00% % Reduction in Volume: Full Thickness Without Exposed Full Thickness Without Exposed Full Thickness Without Exposed Classification: Support Structures Support Structures Support Structures Medium Medium Medium Exudate Amount: Serous Serous Serous Exudate Type: Psychologist, forensic Exudate Color: Flat and Intact Flat and Intact Flat and Intact Wound Margin: Large (67-100%) Large (67-100%) Large (67-100%) Granulation Amount: Red Pink Pink Granulation Quality: None Present (0%) N/A N/A Necrotic Amount: Fat Layer (Subcutaneous Tissue): Yes Fat Layer (Subcutaneous Tissue): Yes Fat Layer (Subcutaneous Tissue): Yes Exposed Structures: Fascia: No Fascia: No Fascia: No Tendon: No Tendon: No Tendon: No Muscle: No Muscle: No Muscle: No Joint: No Joint: No Joint: No Bone: No Bone: No Bone: No Small (1-33%) Small (1-33%) Small (1-33%) Epithelialization: Wound Number: 5 6 N/A Photos: N/A Right, Anterior Lower Leg Right, Lateral Ankle N/A Wound Location: Gradually Appeared  Gradually Appeared N/A Wounding Event: Venous Leg Ulcer Venous Leg Ulcer N/A Primary Etiology: N/A Lymphedema N/A Secondary Etiology: Cataracts, Deep Vein Thrombosis, Cataracts, Deep Vein Thrombosis, N/A Comorbid History: Peripheral Venous Disease, Crohns, Peripheral Venous Disease, Crohns, Osteoarthritis, Confinement Anxiety Osteoarthritis, Confinement Anxiety 11/26/2020 11/26/2020 N/A Date Acquired: 0 0 N/A Weeks of Treatment: Open Open N/A Wound Status: 0.7x1x0.1 1.2x2.1x0.1 N/A Measurements L x W x D (cm) 0.55 1.979 N/A A (cm) : rea 0.055 0.198 N/A Volume (cm) : 0.00% 0.00% N/A % Reduction in Area: 0.00% 0.00% N/A % Reduction in Volume: Full Thickness Without Exposed Full Thickness Without Exposed N/A Classification: Support Structures Support Structures Medium Large N/A Exudate Amount: Serous Serous N/A Exudate Type: Media planner N/A Exudate Color: Flat and Intact Indistinct, nonvisible N/A Wound Margin: Large (67-100%) Small (1-33%) N/A Granulation Amount: Red Pink N/A Granulation Quality: None Present (0%) None Present (0%) N/A Necrotic Amount: Fat Layer (Subcutaneous Tissue): Yes Fat Layer (Subcutaneous Tissue): Yes N/A Exposed Structures: Fascia: No Fascia: No Tendon: No Tendon: No Muscle: No Muscle: No Joint: No Joint: No Bone: No Bone: No Small (1-33%) Large (67-100%) N/A Epithelialization: Treatment Notes Electronic Signature(s) Signed: 12/04/2020 3:06:04 PM By: Geralyn Corwin DO Signed: 12/04/2020 5:07:29 PM By: Shawn Stall RN, BSN Entered By: Geralyn Corwin on 12/04/2020 13:35:02 -------------------------------------------------------------------------------- Multi-Disciplinary Care Plan Details Patient Name: Date of Service: Barbara Sheets B. 12/04/2020 1:00 PM Medical Record Number: [id number] Patient Account Number: [id number] Date of Birth/Sex: Treating RN: 1940-08-13 (80 y.o. Tommye Standard Primary Care Linder Prajapati: [name] Other Clinician: Referring Nivaan Dicenzo: Treating Nannie Starzyk/Extender: Tomasita Crumble in Treatment: 14 Multidisciplinary Care Plan reviewed with physician Active Inactive Venous Leg Ulcer Nursing Diagnoses: Potential for venous Insuffiency (use before diagnosis confirmed) Goals: Patient will maintain optimal edema control Date Initiated: 08/23/2020 Target Resolution Date: 01/08/2021 Goal Status: Active Interventions: Assess peripheral edema status every visit. Provide education on venous insufficiency Treatment Activities: Non-invasive vascular studies : 08/23/2020 T ordered outside of clinic : 08/23/2020 est Therapeutic compression applied : 08/23/2020 Notes: Wound/Skin Impairment Nursing Diagnoses: Knowledge deficit related to ulceration/compromised skin integrity Goals: Patient/caregiver will verbalize understanding of skin care regimen Date Initiated: 08/23/2020 Target Resolution Date: 01/08/2021 Goal Status: Active Ulcer/skin breakdown will have a volume reduction of 30% by week 4 Date Initiated: 10/01/2020 Target Resolution Date: 01/08/2021 Goal Status: Active Interventions: Assess patient/caregiver ability to obtain necessary supplies  Assess patient/caregiver ability to perform ulcer/skin care regimen upon admission and as needed Provide education on ulcer and skin care Treatment Activities: Skin care regimen initiated : 08/23/2020 Topical wound management initiated : 08/23/2020 Notes: Electronic Signature(s) Signed: 12/04/2020 5:15:16 PM By: Zenaida Deed RN, BSN Entered By: Zenaida Deed on 12/04/2020 13:25:36 -------------------------------------------------------------------------------- Pain Assessment Details Patient Name: Date of Service: Barbara Sheets B. 12/04/2020 1:00 PM Medical Record Number: [id number] Patient Account Number: [id number] Date of Birth/Sex: Treating RN: 07-24-1940 (80 y.o. Tommye Standard Primary Care Keene Gilkey:  [name] Other Clinician: Referring Rosaire Cueto: Treating Denecia Brunette/Extender: Tomasita Crumble in Treatment: 14 Active Problems Location of Pain Severity and Description of Pain Patient Has Paino Yes Site Locations Pain Location: Pain Location: Pain in Ulcers With Dressing Change: Yes Duration of the Pain. Constant / Intermittento Intermittent Rate the pain. Current Pain Level: 0 Worst Pain Level: 9 Least Pain Level: 0 Character of Pain Describe the Pain: Burning Pain Management and Medication Current Pain Management: Medication: Yes Is the Current Pain Management Adequate: Adequate Rest: Yes How does your wound impact your activities of daily livingo Sleep: No Bathing: No Appetite: No Relationship With Others: No Bladder Continence: No Emotions: Yes Bowel Continence: No Hobbies: No Toileting: No Dressing: No Electronic Signature(s) Signed: 12/04/2020 5:15:16 PM By: Zenaida Deed RN, BSN Entered By: Zenaida Deed on 12/04/2020 12:57:31 -------------------------------------------------------------------------------- Patient/Caregiver Education Details Patient Name: Date of Service: Barbara Lucero 10/11/2022andnbsp1:00 PM Medical Record Number: [id number] Patient Account Number: [id number] Date of Birth/Gender: Treating RN: May 16, 1940 (80 y.o. Tommye Standard Primary Care Physician: [name] Other Clinician: Referring Physician: Treating Physician/Extender: Tomasita Crumble in Treatment: 14 Education Assessment Education Provided To: Patient Education Topics Provided Venous: Methods: Explain/Verbal Responses: Reinforcements needed, State content correctly Wound/Skin Impairment: Methods: Explain/Verbal Responses: Reinforcements needed, State content correctly Electronic Signature(s) Signed: 12/04/2020 5:15:16 PM By: Zenaida Deed RN, BSN Signed: 12/04/2020 5:15:16 PM By: Zenaida Deed RN, BSN Entered By:  Zenaida Deed on 12/04/2020 13:26:44 -------------------------------------------------------------------------------- Wound Assessment Details Patient Name: Date of Service: Barbara Sheets B. 12/04/2020 1:00 PM Medical Record Number: [id number] Patient Account Number: [id number] Date of Birth/Sex: Treating RN: Feb 07, 1941 (80 y.o. Billy Coast, Linda Primary Care Mouhamad Teed: [name] Other Clinician: Referring Haroun Cotham: Treating Keontae Levingston/Extender: Tomasita Crumble in Treatment: 14 Wound Status Wound Number: 3 Primary Venous Leg Ulcer Etiology: Wound Location: Left, Medial Lower Leg Secondary Lymphedema Wounding Event: Blister Etiology: Date Acquired: 11/26/2020 Wound Open Weeks Of Treatment: 0 Status: Clustered Wound: No Comorbid Cataracts, Deep Vein Thrombosis, Peripheral Venous Disease, History: Crohns, Osteoarthritis, Confinement Anxiety Photos Wound Measurements Length: (cm) 1 Width: (cm) 1 Depth: (cm) 0.1 Area: (cm) 0.785 Volume: (cm) 0.079 % Reduction in Area: 0% % Reduction in Volume: 0% Epithelialization: Small (1-33%) Tunneling: No Undermining: No Wound Description Classification: Full Thickness Without Exposed Support Structures Wound Margin: Flat and Intact Exudate Amount: Medium Exudate Type: Serous Exudate Color: amber Foul Odor After Cleansing: No Slough/Fibrino No Wound Bed Granulation Amount: Large (67-100%) Exposed Structure Granulation Quality: Red Fascia Exposed: No Necrotic Amount: None Present (0%) Fat Layer (Subcutaneous Tissue) Exposed: Yes Tendon Exposed: No Muscle Exposed: No Joint Exposed: No Bone Exposed: No Treatment Notes Wound #3 (Lower Leg) Wound Laterality: Left, Medial Cleanser Peri-Wound Care Triamcinolone 15 (g) Discharge Instruction: Use triamcinolone 15 (g) as directed Sween Lotion (Moisturizing lotion) Discharge Instruction: Apply moisturizing lotion as directed Topical Primary Dressing KerraCel  Ag Gelling Fiber Dressing, 2x2 in (silver alginate) Discharge  Instruction: Apply silver alginate to wound bed as instructed Secondary Dressing Woven Gauze Sponge, Non-Sterile 4x4 in Discharge Instruction: Apply over primary dressing as directed. ABD Pad, 8x10 Discharge Instruction: Apply over primary dressing as directed. Secured With Compression Wrap Kerlix Roll 4.5x3.1 (in/yd) Discharge Instruction: Apply Kerlix and Coban compression as directed. Coban Self-Adherent Wrap 4x5 (in/yd) Discharge Instruction: Apply over Kerlix as directed. Compression Stockings Add-Ons Electronic Signature(s) Signed: 12/04/2020 5:02:53 PM By: Fonnie Mu RN Signed: 12/04/2020 5:15:16 PM By: Zenaida Deed RN, BSN Entered By: Fonnie Mu on 12/04/2020 13:16:08 -------------------------------------------------------------------------------- Wound Assessment Details Patient Name: Date of Service: Barbara Sheets B. 12/04/2020 1:00 PM Medical Record Number: [id number] Patient Account Number: [id number] Date of Birth/Sex: Treating RN: [birth year] (80 y.o. Billy Coast, Linda Primary Care Adrain Nesbit: [name] Other Clinician: Referring Taygan Connell: Treating Shams Fill/Extender: Tomasita Crumble in Treatment: 14 Wound Status Wound Number: 4 Primary Venous Leg Ulcer Etiology: Wound Location: Left, Lateral Ankle Wound Open Wounding Event: Blister Status: Date Acquired: 11/26/2020 Comorbid Cataracts, Deep Vein Thrombosis, Peripheral Venous Disease, Weeks Of Treatment: 0 History: Crohns, Osteoarthritis, Confinement Anxiety Clustered Wound: No Wound Measurements Length: (cm) 0.5 Width: (cm) 0.4 Depth: (cm) 0.1 Area: (cm) 0.157 Volume: (cm) 0.016 % Reduction in Area: 0% % Reduction in Volume: 0% Epithelialization: Small (1-33%) Tunneling: No Undermining: No Wound Description Classification: Full Thickness Without Exposed Support Structures Wound Margin: Flat and  Intact Exudate Amount: Medium Exudate Type: Serous Exudate Color: amber Foul Odor After Cleansing: No Slough/Fibrino No Wound Bed Granulation Amount: Large (67-100%) Exposed Structure Granulation Quality: Pink Fascia Exposed: No Fat Layer (Subcutaneous Tissue) Exposed: Yes Tendon Exposed: No Muscle Exposed: No Joint Exposed: No Bone Exposed: No Electronic Signature(s) Signed: 12/04/2020 5:02:53 PM By: Fonnie Mu RN Signed: 12/04/2020 5:15:16 PM By: Zenaida Deed RN, BSN Entered By: Fonnie Mu on 12/04/2020 13:18:20 -------------------------------------------------------------------------------- Wound Assessment Details Patient Name: Date of Service: Barbara Sheets B. 12/04/2020 1:00 PM Medical Record Number: [id number] Patient Account Number: [id number] Date of Birth/Sex: Treating RN: May 08, 1940 (80 y.o. Billy Coast, Linda Primary Care Jemmie Ledgerwood: [name] Other Clinician: Referring Tanay Massiah: Treating Lux Meaders/Extender: Tomasita Crumble in Treatment: 14 Wound Status Wound Number: 4 Primary Venous Leg Ulcer Etiology: Wound Location: Left, Lateral Ankle Wound Open Wounding Event: Blister Status: Date Acquired: 11/26/2020 Comorbid Cataracts, Deep Vein Thrombosis, Peripheral Venous Disease, Weeks Of Treatment: 0 History: Crohns, Osteoarthritis, Confinement Anxiety Clustered Wound: No Photos Wound Measurements Length: (cm) 0.5 Width: (cm) 0.4 Depth: (cm) 0.1 Area: (cm) 0.157 Volume: (cm) 0.016 % Reduction in Area: 0% % Reduction in Volume: 0% Epithelialization: Small (1-33%) Tunneling: No Undermining: No Wound Description Classification: Full Thickness Without Exposed Support Structures Wound Margin: Flat and Intact Exudate Amount: Medium Exudate Type: Serous Exudate Color: amber Foul Odor After Cleansing: No Slough/Fibrino No Wound Bed Granulation Amount: Large (67-100%) Exposed Structure Granulation Quality: Pink Fascia  Exposed: No Fat Layer (Subcutaneous Tissue) Exposed: Yes Tendon Exposed: No Muscle Exposed: No Joint Exposed: No Bone Exposed: No Treatment Notes Wound #4 (Ankle) Wound Laterality: Left, Lateral Cleanser Peri-Wound Care Triamcinolone 15 (g) Discharge Instruction: Use triamcinolone 15 (g) as directed Sween Lotion (Moisturizing lotion) Discharge Instruction: Apply moisturizing lotion as directed Topical Primary Dressing KerraCel Ag Gelling Fiber Dressing, 2x2 in (silver alginate) Discharge Instruction: Apply silver alginate to wound bed as instructed Secondary Dressing Woven Gauze Sponge, Non-Sterile 4x4 in Discharge Instruction: Apply over primary dressing as directed. ABD Pad, 8x10 Discharge Instruction: Apply over primary dressing as directed. Secured With Compression Wrap  Kerlix Roll 4.5x3.1 (in/yd) Discharge Instruction: Apply Kerlix and Coban compression as directed. Coban Self-Adherent Wrap 4x5 (in/yd) Discharge Instruction: Apply over Kerlix as directed. Compression Stockings Add-Ons Electronic Signature(s) Signed: 12/04/2020 5:02:53 PM By: Fonnie Mu RN Signed: 12/04/2020 5:15:16 PM By: Zenaida Deed RN, BSN Entered By: Fonnie Mu on 12/04/2020 13:18:20 -------------------------------------------------------------------------------- Wound Assessment Details Patient Name: Date of Service: Barbara Sheets B. 12/04/2020 1:00 PM Medical Record Number: [id number] Patient Account Number: [id number] Date of Birth/Sex: Treating RN: 09-25-1940 (80 y.o. Billy Coast, Linda Primary Care Antar Milks: [name] Other Clinician: Referring Angelyna Henderson: Treating Shawntee Mainwaring/Extender: Tomasita Crumble in Treatment: 14 Wound Status Wound Number: 5 Primary Venous Leg Ulcer Etiology: Wound Location: Right, Anterior Lower Leg Wound Open Wounding Event: Gradually Appeared Status: Date Acquired: 11/26/2020 Comorbid Cataracts, Deep Vein Thrombosis,  Peripheral Venous Disease, Weeks Of Treatment: 0 History: Crohns, Osteoarthritis, Confinement Anxiety Clustered Wound: No Photos Wound Measurements Length: (cm) 0.7 Width: (cm) 1 Depth: (cm) 0.1 Area: (cm) 0.55 Volume: (cm) 0.055 % Reduction in Area: 0% % Reduction in Volume: 0% Epithelialization: Small (1-33%) Tunneling: No Undermining: No Wound Description Classification: Full Thickness Without Exposed Support Structures Wound Margin: Flat and Intact Exudate Amount: Medium Exudate Type: Serous Exudate Color: amber Foul Odor After Cleansing: No Slough/Fibrino No Wound Bed Granulation Amount: Large (67-100%) Exposed Structure Granulation Quality: Red Fascia Exposed: No Necrotic Amount: None Present (0%) Fat Layer (Subcutaneous Tissue) Exposed: Yes Tendon Exposed: No Muscle Exposed: No Joint Exposed: No Bone Exposed: No Treatment Notes Wound #5 (Lower Leg) Wound Laterality: Right, Anterior Cleanser Peri-Wound Care Triamcinolone 15 (g) Discharge Instruction: Use triamcinolone 15 (g) as directed Sween Lotion (Moisturizing lotion) Discharge Instruction: Apply moisturizing lotion as directed Topical Primary Dressing KerraCel Ag Gelling Fiber Dressing, 2x2 in (silver alginate) Discharge Instruction: Apply silver alginate to wound bed as instructed Secondary Dressing Woven Gauze Sponge, Non-Sterile 4x4 in Discharge Instruction: Apply over primary dressing as directed. ABD Pad, 8x10 Discharge Instruction: Apply over primary dressing as directed. Secured With Compression Wrap Kerlix Roll 4.5x3.1 (in/yd) Discharge Instruction: Apply Kerlix and Coban compression as directed. Coban Self-Adherent Wrap 4x5 (in/yd) Discharge Instruction: Apply over Kerlix as directed. Compression Stockings Add-Ons Electronic Signature(s) Signed: 12/04/2020 5:02:53 PM By: Fonnie Mu RN Signed: 12/04/2020 5:15:16 PM By: Zenaida Deed RN, BSN Entered By: Fonnie Mu on  12/04/2020 13:19:38 -------------------------------------------------------------------------------- Wound Assessment Details Patient Name: Date of Service: Barbara Sheets B. 12/04/2020 1:00 PM Medical Record Number: [id number] Patient Account Number: [id number] Date of Birth/Sex: Treating RN: 08/03/1940 (80 y.o. Billy Coast, Linda Primary Care Larisa Lanius: [name] Other Clinician: Referring Nesa Distel: Treating Rogen Porte/Extender: Tomasita Crumble in Treatment: 14 Wound Status Wound Number: 6 Primary Venous Leg Ulcer Etiology: Wound Location: Right, Lateral Ankle Secondary Lymphedema Wounding Event: Gradually Appeared Etiology: Date Acquired: 11/26/2020 Wound Open Weeks Of Treatment: 0 Status: Clustered Wound: No Comorbid Cataracts, Deep Vein Thrombosis, Peripheral Venous Disease, History: Crohns, Osteoarthritis, Confinement Anxiety Photos Wound Measurements Length: (cm) 1.2 Width: (cm) 2.1 Depth: (cm) 0.1 Area: (cm) 1.979 Volume: (cm) 0.198 % Reduction in Area: 0% % Reduction in Volume: 0% Epithelialization: Large (67-100%) Tunneling: No Undermining: No Wound Description Classification: Full Thickness Without Exposed Support Structures Wound Margin: Indistinct, nonvisible Exudate Amount: Large Exudate Type: Serous Exudate Color: amber Foul Odor After Cleansing: No Slough/Fibrino No Wound Bed Granulation Amount: Small (1-33%) Exposed Structure Granulation Quality: Pink Fascia Exposed: No Necrotic Amount: None Present (0%) Fat Layer (Subcutaneous Tissue) Exposed: Yes Tendon Exposed: No Muscle Exposed: No Joint  Exposed: No Bone Exposed: No Treatment Notes Wound #6 (Ankle) Wound Laterality: Right, Lateral Cleanser Peri-Wound Care Triamcinolone 15 (g) Discharge Instruction: Use triamcinolone 15 (g) as directed Sween Lotion (Moisturizing lotion) Discharge Instruction: Apply moisturizing lotion as directed Topical Primary Dressing KerraCel  Ag Gelling Fiber Dressing, 2x2 in (silver alginate) Discharge Instruction: Apply silver alginate to wound bed as instructed Secondary Dressing Woven Gauze Sponge, Non-Sterile 4x4 in Discharge Instruction: Apply over primary dressing as directed. ABD Pad, 8x10 Discharge Instruction: Apply over primary dressing as directed. Secured With Compression Wrap Kerlix Roll 4.5x3.1 (in/yd) Discharge Instruction: Apply Kerlix and Coban compression as directed. Coban Self-Adherent Wrap 4x5 (in/yd) Discharge Instruction: Apply over Kerlix as directed. Compression Stockings Add-Ons Electronic Signature(s) Signed: 12/04/2020 5:02:53 PM By: Fonnie Mu RN Signed: 12/04/2020 5:15:16 PM By: Zenaida Deed RN, BSN Entered By: Fonnie Mu on 12/04/2020 13:21:43 -------------------------------------------------------------------------------- Vitals Details Patient Name: Date of Service: Barbara Sheets B. 12/04/2020 1:00 PM Medical Record Number: [id number] Patient Account Number: [id number] Date of Birth/Sex: Treating RN: Jun 01, 1940 (80 y.o. Billy Coast, Linda Primary Care Sequoia Witz: [name] Other Clinician: Referring Coleston Dirosa: Treating Ellarie Picking/Extender: Tomasita Crumble in Treatment: 14 Vital Signs Time Taken: 12:58 Temperature (F): 97.8 Height (in): 63 Pulse (bpm): 81 Source: Stated Respiratory Rate (breaths/min): 18 Weight (lbs): 182 Blood Pressure (mmHg): 181/75 Source: Stated Reference Range: 80 - 120 mg / dl Body Mass Index (BMI): 32.2 Electronic Signature(s) Signed: 12/04/2020 5:15:16 PM By: Zenaida Deed RN, BSN Entered By: Zenaida Deed on 12/04/2020 12:59:02

## 2020-12-11 ENCOUNTER — Other Ambulatory Visit: Payer: Self-pay

## 2020-12-11 ENCOUNTER — Encounter (HOSPITAL_BASED_OUTPATIENT_CLINIC_OR_DEPARTMENT_OTHER): Payer: Medicare Other | Admitting: Internal Medicine

## 2020-12-11 DIAGNOSIS — L03116 Cellulitis of left lower limb: Secondary | ICD-10-CM | POA: Diagnosis not present

## 2020-12-11 DIAGNOSIS — L97312 Non-pressure chronic ulcer of right ankle with fat layer exposed: Secondary | ICD-10-CM | POA: Diagnosis not present

## 2020-12-11 DIAGNOSIS — Z86718 Personal history of other venous thrombosis and embolism: Secondary | ICD-10-CM | POA: Diagnosis not present

## 2020-12-11 DIAGNOSIS — I87333 Chronic venous hypertension (idiopathic) with ulcer and inflammation of bilateral lower extremity: Secondary | ICD-10-CM | POA: Diagnosis not present

## 2020-12-11 DIAGNOSIS — I872 Venous insufficiency (chronic) (peripheral): Secondary | ICD-10-CM | POA: Diagnosis not present

## 2020-12-11 DIAGNOSIS — K5090 Crohn's disease, unspecified, without complications: Secondary | ICD-10-CM | POA: Diagnosis not present

## 2020-12-11 DIAGNOSIS — I739 Peripheral vascular disease, unspecified: Secondary | ICD-10-CM | POA: Diagnosis not present

## 2020-12-11 DIAGNOSIS — L97921 Non-pressure chronic ulcer of unspecified part of left lower leg limited to breakdown of skin: Secondary | ICD-10-CM | POA: Diagnosis not present

## 2020-12-11 DIAGNOSIS — L97322 Non-pressure chronic ulcer of left ankle with fat layer exposed: Secondary | ICD-10-CM | POA: Diagnosis not present

## 2020-12-11 DIAGNOSIS — L03115 Cellulitis of right lower limb: Secondary | ICD-10-CM | POA: Diagnosis not present

## 2020-12-11 DIAGNOSIS — I890 Lymphedema, not elsewhere classified: Secondary | ICD-10-CM | POA: Diagnosis not present

## 2020-12-11 DIAGNOSIS — L97811 Non-pressure chronic ulcer of other part of right lower leg limited to breakdown of skin: Secondary | ICD-10-CM | POA: Diagnosis not present

## 2020-12-11 NOTE — Progress Notes (Signed)
Barbara Lucero, Barbara Lucero ([id number]) Visit Report for 12/11/2020 HPI Details Patient Name: Date of Service: Barbara Lucero 12/11/2020 2:00 PM Medical Record Number: [id number] Patient Account Number: [id number] Date of Birth/Sex: Treating RN: February 08, 1941 (80 y.o. [name], [name] Primary Care Provider: [name] Other Clinician: Referring Provider: Treating Provider/Extender: [name] in Treatment: 15 History of Present Illness HPI Description: ADMISSION 08/23/2020 This is a 80 year old woman who arrives accompanied by her sister-in-law. 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 She has been dealing with chronic edema and discoloration of her legs for several years per her sister-in-law. I note that she saw Dr. Myra Gianotti on 05/21/2020 and he noted an extensive left leg DVT in 2019 although she is no longer on oral anticoagulants. He felt she had chronic venous insufficiency with skin changes related to this. He ordered her 20/30 stockings which were zippered stockings but I do not get the sense that the patient was all that compliant. In any case she started to develop weeping edema fluid recently coming out of the right lateral ankle serous drainage. She said it was pouring out last week. She saw her primary care provider on 08/08/2020 and was put on clindamycin which she is just finishing. She says the erythema feels better. Past medical history, aortic stenosis, chronic venous insufficiency, peripheral vascular disease, Crohn's disease which has been really recently quiescent, left hip fracture, DVT of the left leg in 2019. Notable that she has a very disabled son at home who she is the primary caregiver for Dr. Randie Heinz quoted an ABI of 1.12 on the right with a TBI of 0.65 on the left and 0.93 and a TBI of 0.58. In our clinic today the ABI was 0.83 on the right and 0.81 on the left 7/7 the patient's wound on the right lateral ankle is healed. Her edema control is much  better. It turns out she does not have stockings but came in with Tubigrip on the left leg Readmission: 10/01/2020 on evaluation today patient appears to be doing decently well in regard to her leg. Unfortunately she does have an opening which happened as a result of her not wearing the compression socks she had something on the leg which was okay not necessarily the best but unfortunately did not include anything for the foot which is the main issue that we are finding here. With that being said I discussed with the patient that she really needs to have something for both and I think ordering her juxta lite compression wrap would be the ideal thing to do. 10/09/2020; patient I last saw in early July. At that point she had an area on the right lateral ankle which closed over. I am not really sure what happened however she is readmitted to the clinic last week. She had an area on her right lateral ankle this is closed however she has another area on the right lateral foot also quite a bit of discomfort on the right anterior foot. She has nothing open on the left leg. She comes in today with a single juxta lite stocking. She has a mid calf compression stocking she bought off [employer] on the left 10/16/2020; everything that we are concerned about on the right leg is healed including the right lateral ankle and right dorsal foot. She has another juxta light for the right leg, she had 1 on the left. The venous reflux studies I ordered last week are on Thursday. I will  see her back 1 more time to review these. She had already been seen by Dr. Myra Gianotti 9/6; back in to discuss her reflux studies. This showed reflux in the greater saphenous vein on the right. I am going to send her back for Dr. Myra Gianotti to review these. She is wearing her juxta lite stockings but only had 20/30 mmHg compression. She became concerned this morning with erythema on the right leg. She says she has an appoint with Dr. Myra Gianotti in about 2  weeks 12/04/2020; patient presents after being discharged 1 month ago from our clinic. She states that she has been wearing her compression stockings intermittently. She has open wounds to her lower extremities bilaterally limited to skin breakdown. She has increased redness to her right foot and increased weeping to the right ankle area. She has increased pain to the right lower extremity as well. She has not followed up with vein and vascular since discharge from our clinic last month as recommended. 10/18; I see the patient was readmitted last week for again small weeping areas on the right lateral and a larger superficial weeping area on the left lateral lower legs she has an appointment with Dr. Myra Gianotti to review the reflux studies to see if anything could be ablated from a superficial vein point of view. We are using 3 layer compression on Electronic Signature(s) Signed: 12/11/2020 4:56:56 PM By: Baltazar Najjar MD Entered By: Baltazar Najjar on 12/11/2020 16:33:21 -------------------------------------------------------------------------------- Physical Exam Details Patient Name: Date of Service: Barbara Sheets B. 12/11/2020 2:00 PM Medical Record Number: [id number] Patient Account Number: [id number] Date of Birth/Sex: Treating RN: 1941/01/18 (80 y.o. Toniann Fail Primary Care Provider: [name] Other Clinician: Referring Provider: Treating Provider/Extender: [name] in Treatment: 15 Cardiovascular Pedal pulses are palpable bilaterally. Notes Wound exam; the patient has a small open area on the right lateral ankle and a larger area on the left lateral ankle. Both with edema and weeping edema fluid. There is no evidence of cellulitis. She still has pitting edema Electronic Signature(s) Signed: 12/11/2020 4:56:56 PM By: Baltazar Najjar MD Entered By: Baltazar Najjar on 12/11/2020  16:34:26 -------------------------------------------------------------------------------- Physician Orders Details Patient Name: Date of Service: Barbara Sheets B. 12/11/2020 2:00 PM Medical Record Number: [id number] Patient Account Number: [id number] Date of Birth/Sex: Treating RN: 11-Jan-1941 (80 y.o. [name], [name] Primary Care Provider: [name] Other Clinician: Referring Provider: Treating Provider/Extender: [name] in Treatment: 15 Verbal / Phone Orders: No Diagnosis Coding Follow-up Appointments ppointment in 1 week. - on Monday with Dr. Mikey Bussing after Vein and Vascular appt. Return A You may remove wraps and put on juxta lites on Monday before your Vein and Vascular appt. at 11:45, then come over here after your VandV appt. for an appt. to see Dr. Mikey Bussing and to get re wrapped. Bathing/ Shower/ Hygiene May shower with protection but do not get wound dressing(s) wet. Edema Control - Lymphedema / SCD / Other Bilateral Lower Extremities Elevate legs to the level of the heart or above for 30 minutes daily and/or when sitting, a frequency of: - throughout the day Avoid standing for long periods of time. Exercise regularly Wound Treatment Wound #4 - Ankle Wound Laterality: Left, Lateral Peri-Wound Care: Triamcinolone 15 (g) 1 x Per Week/30 Days Discharge Instructions: Use triamcinolone 15 (g) as directed Peri-Wound Care: Sween Lotion (Moisturizing lotion) 1 x Per Week/30 Days Discharge Instructions: Apply moisturizing lotion as directed Prim Dressing: KerraCel Ag Gelling Fiber Dressing,  2x2 in (silver alginate) 1 x Per Week/30 Days ary Discharge Instructions: Apply silver alginate to wound bed as instructed Secondary Dressing: Woven Gauze Sponge, Non-Sterile 4x4 in 1 x Per Week/30 Days Discharge Instructions: Apply over primary dressing as directed. Secondary Dressing: ABD Pad, 8x10 1 x Per Week/30 Days Discharge Instructions: Apply over primary  dressing as directed. Compression Wrap: ThreePress (3 layer compression wrap) 1 x Per Week/30 Days Discharge Instructions: Apply three layer compression as directed. Wound #6 - Ankle Wound Laterality: Right, Lateral Peri-Wound Care: Triamcinolone 15 (g) 1 x Per Week/30 Days Discharge Instructions: Use triamcinolone 15 (g) as directed Peri-Wound Care: Sween Lotion (Moisturizing lotion) 1 x Per Week/30 Days Discharge Instructions: Apply moisturizing lotion as directed Prim Dressing: KerraCel Ag Gelling Fiber Dressing, 2x2 in (silver alginate) 1 x Per Week/30 Days ary Discharge Instructions: Apply silver alginate to wound bed as instructed Secondary Dressing: Woven Gauze Sponge, Non-Sterile 4x4 in 1 x Per Week/30 Days Discharge Instructions: Apply over primary dressing as directed. Secondary Dressing: ABD Pad, 8x10 1 x Per Week/30 Days Discharge Instructions: Apply over primary dressing as directed. Compression Wrap: ThreePress (3 layer compression wrap) 1 x Per Week/30 Days Discharge Instructions: Apply three layer compression as directed. Electronic Signature(s) Signed: 12/11/2020 4:50:06 PM By: Fonnie Mu RN Signed: 12/11/2020 4:56:56 PM By: Baltazar Najjar MD Entered By: Fonnie Mu on 12/11/2020 16:39:38 -------------------------------------------------------------------------------- Problem List Details Patient Name: Date of Service: Barbara Sheets B. 12/11/2020 2:00 PM Medical Record Number: [id number] Patient Account Number: [id number] Date of Birth/Sex: Treating RN: 1940/12/22 (80 y.o. [name], [name] Primary Care Provider: [name] Other Clinician: Referring Provider: Treating Provider/Extender: [name] in Treatment: 15 Active Problems ICD-10 Encounter Code Description Active Date MDM Diagnosis I87.331 Chronic venous hypertension (idiopathic) with ulcer and inflammation of right 08/23/2020 No Yes lower extremity L97.811  Non-pressure chronic ulcer of other part of right lower leg limited to breakdown 08/23/2020 No Yes of skin I87.332 Chronic venous hypertension (idiopathic) with ulcer and inflammation of left 12/04/2020 No Yes lower extremity L97.921 Non-pressure chronic ulcer of unspecified part of left lower leg limited to 12/04/2020 No Yes breakdown of skin I89.0 Lymphedema, not elsewhere classified 08/23/2020 No Yes L03.115 Cellulitis of right lower limb 10/09/2020 No Yes Inactive Problems Resolved Problems Electronic Signature(s) Signed: 12/11/2020 4:56:56 PM By: Baltazar Najjar MD Entered By: Baltazar Najjar on 12/11/2020 16:31:55 -------------------------------------------------------------------------------- Progress Note Details Patient Name: Date of Service: Barbara Sheets B. 12/11/2020 2:00 PM Medical Record Number: [id number] Patient Account Number: [id number] Date of Birth/Sex: Treating RN: 05/06/1940 (81 y.o. [name], [name] Primary Care Provider: [name] Other Clinician: Referring Provider: Treating Provider/Extender: [name] in Treatment: 15 Subjective History of Present Illness (HPI) ADMISSION 08/23/2020 This is a 80 year old woman who arrives accompanied by her sister-in-law. 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 She has been dealing with chronic edema and discoloration of her legs for several years per her sister-in-law. I note that she saw Dr. Myra Gianotti on 05/21/2020 and he noted an extensive left leg DVT in 2019 although she is no longer on oral anticoagulants. He felt she had chronic venous insufficiency with skin changes related to this. He ordered her 20/30 stockings which were zippered stockings but I do not get the sense that the patient was all that compliant. In any case she started to develop weeping edema fluid recently coming out of the right lateral ankle serous drainage. She said it was pouring out  last week. She saw her primary care  provider on 08/08/2020 and was put on clindamycin which she is just finishing. She says the erythema feels better. Past medical history, aortic stenosis, chronic venous insufficiency, peripheral vascular disease, Crohn's disease which has been really recently quiescent, left hip fracture, DVT of the left leg in 2019. Notable that she has a very disabled son at home who she is the primary caregiver for Dr. Randie Heinz quoted an ABI of 1.12 on the right with a TBI of 0.65 on the left and 0.93 and a TBI of 0.58. In our clinic today the ABI was 0.83 on the right and 0.81 on the left 7/7 the patient's wound on the right lateral ankle is healed. Her edema control is much better. It turns out she does not have stockings but came in with Tubigrip on the left leg Readmission: 10/01/2020 on evaluation today patient appears to be doing decently well in regard to her leg. Unfortunately she does have an opening which happened as a result of her not wearing the compression socks she had something on the leg which was okay not necessarily the best but unfortunately did not include anything for the foot which is the main issue that we are finding here. With that being said I discussed with the patient that she really needs to have something for both and I think ordering her juxta lite compression wrap would be the ideal thing to do. 10/09/2020; patient I last saw in early July. At that point she had an area on the right lateral ankle which closed over. I am not really sure what happened however she is readmitted to the clinic last week. She had an area on her right lateral ankle this is closed however she has another area on the right lateral foot also quite a bit of discomfort on the right anterior foot. She has nothing open on the left leg. She comes in today with a single juxta lite stocking. She has a mid calf compression stocking she bought off [employer] on the left 10/16/2020; everything that we are concerned about on the  right leg is healed including the right lateral ankle and right dorsal foot. She has another juxta light for the right leg, she had 1 on the left. The venous reflux studies I ordered last week are on Thursday. I will see her back 1 more time to review these. She had already been seen by Dr. Myra Gianotti 9/6; back in to discuss her reflux studies. This showed reflux in the greater saphenous vein on the right. I am going to send her back for Dr. Myra Gianotti to review these. She is wearing her juxta lite stockings but only had 20/30 mmHg compression. She became concerned this morning with erythema on the right leg. She says she has an appoint with Dr. Myra Gianotti in about 2 weeks 12/04/2020; patient presents after being discharged 1 month ago from our clinic. She states that she has been wearing her compression stockings intermittently. She has open wounds to her lower extremities bilaterally limited to skin breakdown. She has increased redness to her right foot and increased weeping to the right ankle area. She has increased pain to the right lower extremity as well. She has not followed up with vein and vascular since discharge from our clinic last month as recommended. 10/18; I see the patient was readmitted last week for again small weeping areas on the right lateral and a larger superficial weeping area on the left lateral lower  legs she has an appointment with Dr. Myra Gianotti to review the reflux studies to see if anything could be ablated from a superficial vein point of view. We are using 3 layer compression on Objective Constitutional Vitals Time Taken: 2:46 PM, Height: 63 in, Weight: 182 lbs, BMI: 32.2, Temperature: 97.8 F, Respiratory Rate: 17 breaths/min. Cardiovascular Pedal pulses are palpable bilaterally. General Notes: Wound exam; the patient has a small open area on the right lateral ankle and a larger area on the left lateral ankle. Both with edema and weeping edema fluid. There is no evidence of  cellulitis. She still has pitting edema Integumentary (Hair, Skin) Wound #3 status is Open. Original cause of wound was Blister. The date acquired was: 11/26/2020. The wound has been in treatment 1 weeks. The wound is located on the Left,Medial Lower Leg. The wound measures 0cm length x 0cm width x 0cm depth; 0cm^2 area and 0cm^3 volume. There is Fat Layer (Subcutaneous Tissue) exposed. There is a medium amount of serous drainage noted. The wound margin is flat and intact. There is large (67-100%) red granulation within the wound bed. There is no necrotic tissue within the wound bed. Wound #4 status is Open. Original cause of wound was Blister. The date acquired was: 11/26/2020. The wound has been in treatment 1 weeks. The wound is located on the Left,Lateral Ankle. The wound measures 0.4cm length x 0.3cm width x 0.1cm depth; 0.094cm^2 area and 0.009cm^3 volume. There is Fat Layer (Subcutaneous Tissue) exposed. There is no tunneling or undermining noted. There is a medium amount of serous drainage noted. The wound margin is flat and intact. There is large (67-100%) pink granulation within the wound bed. Wound #5 status is Open. Original cause of wound was Gradually Appeared. The date acquired was: 11/26/2020. The wound has been in treatment 1 weeks. The wound is located on the Right,Anterior Lower Leg. The wound measures 0cm length x 0cm width x 0cm depth; 0cm^2 area and 0cm^3 volume. There is Fat Layer (Subcutaneous Tissue) exposed. There is a medium amount of serous drainage noted. The wound margin is flat and intact. There is large (67-100%) red granulation within the wound bed. There is no necrotic tissue within the wound bed. Wound #6 status is Open. Original cause of wound was Gradually Appeared. The date acquired was: 11/26/2020. The wound has been in treatment 1 weeks. The wound is located on the Right,Lateral Ankle. The wound measures 5.5cm length x 3.4cm width x 0.1cm depth; 14.687cm^2 area and  1.469cm^3 volume. There is Fat Layer (Subcutaneous Tissue) exposed. There is no tunneling or undermining noted. There is a large amount of serous drainage noted. The wound margin is indistinct and nonvisible. There is small (1-33%) pink granulation within the wound bed. There is no necrotic tissue within the wound bed. 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 There was a Three Layer Compression Therapy Procedure by Fonnie Mu, RN. Post procedure Diagnosis Wound #4: Same as Pre-Procedure Wound #6 Pre-procedure diagnosis of Wound #6 is a Venous Leg Ulcer located on the  Right,Lateral Ankle . There was a Three Layer Compression Therapy Procedure by Fonnie Mu, RN. Post procedure Diagnosis Wound #6: Same as Pre-Procedure Plan Follow-up Appointments: Return Appointment in 1 week. - on Monday with Dr. Mikey Bussing after Vein and Vascular appt. You may remove wraps and put on juxta lites on Monday before your Vein and Vascular appt. at 11:45, then come over here after your VandV appt. for an appt. to see Dr. Mikey Bussing and to get re wrapped. Bathing/ Shower/ Hygiene: May shower with protection but do not get wound dressing(s) wet. Edema Control - Lymphedema / SCD / Other: Elevate legs to the level of the heart or above for 30 minutes daily and/or when sitting, a frequency of: - throughout the day Avoid standing for long periods of time. Exercise  regularly WOUND #4: - Ankle Wound Laterality: Left, Lateral Peri-Wound Care: Triamcinolone 15 (g) 1 x Per Week/30 Days Discharge Instructions: Use triamcinolone 15 (g) as directed Peri-Wound Care: Sween Lotion (Moisturizing lotion) 1 x Per Week/30 Days Discharge Instructions: Apply moisturizing lotion as directed Prim Dressing: KerraCel Ag Gelling Fiber Dressing, 2x2 in (silver alginate) 1 x Per Week/30 Days ary Discharge Instructions: Apply silver alginate to wound bed as instructed Secondary Dressing: Woven Gauze Sponge, Non-Sterile 4x4 in 1 x Per Week/30 Days Discharge Instructions: Apply over primary dressing as directed. Secondary Dressing: ABD Pad, 8x10 1 x Per Week/30 Days Discharge Instructions: Apply over primary dressing as directed. Com pression Wrap: Kerlix Roll 4.5x3.1 (in/yd) 1 x Per Week/30 Days Discharge Instructions: Apply Kerlix and Coban compression as directed. Com pression Wrap: Coban Self-Adherent Wrap 4x5 (in/yd) 1 x Per Week/30 Days Discharge Instructions: Apply over Kerlix as directed. WOUND #6: - Ankle Wound Laterality: Right, Lateral Peri-Wound Care: Triamcinolone 15 (g) 1 x Per Week/30 Days Discharge Instructions: Use triamcinolone 15 (g) as directed Peri-Wound Care: Sween Lotion (Moisturizing lotion) 1 x Per Week/30 Days Discharge Instructions: Apply moisturizing lotion as directed Prim Dressing: KerraCel Ag Gelling Fiber Dressing, 2x2 in (silver alginate) 1 x Per Week/30 Days ary Discharge Instructions: Apply silver alginate to wound bed as instructed Secondary Dressing: Woven Gauze Sponge, Non-Sterile 4x4 in 1 x Per Week/30 Days Discharge Instructions: Apply over primary dressing as directed. Secondary Dressing: ABD Pad, 8x10 1 x Per Week/30 Days Discharge Instructions: Apply over primary dressing as directed. Com pression Wrap: Kerlix Roll 4.5x3.1 (in/yd) 1 x Per Week/30 Days Discharge Instructions: Apply Kerlix and Coban compression as directed. Com  pression Wrap: Coban Self-Adherent Wrap 4x5 (in/yd) 1 x Per Week/30 Days Discharge Instructions: Apply over Kerlix as directed. #1 I am still using silver alginate on both areas. I have increased the compression to the 3 layer from kerlix Coban 2. The patient's follow-up with vascular to review the venous reflux studies his next Monday. She will need to take the wraps off and then come over here in the afternoon for [place] to decide what to do. She has bilateral juxta lites Electronic Signature(s) Signed: 12/11/2020 4:56:56 PM By: Baltazar Najjar MD Entered By: Baltazar Najjar on 12/11/2020 16:39:04 -------------------------------------------------------------------------------- SuperBill Details Patient Name: Date of Service: Barbara Lucero 12/11/2020 Medical Record Number: [id number] Patient Account Number: [id number] Date of Birth/Sex: Treating RN: Oct 15, 1940 (80 y.o. [name], [name] Primary Care Provider: [name] Other Clinician: Referring Provider: Treating Provider/Extender: [name] in Treatment: 15 Diagnosis Coding ICD-10 Codes Code Description [phone] Chronic venous hypertension (idiopathic) with ulcer and inflammation of right lower extremity I89.0 Lymphedema, not elsewhere classified L97.811 Non-pressure  chronic ulcer of other part of right lower leg limited to breakdown of skin L03.115 Cellulitis of right lower limb Facility Procedures CPT4: Code [id number] Description: 81 BILATERAL: Application of multi-layer venous compression system; leg (below knee), including ankle and t. Modifier: Quantity: 1 Physician Procedures Electronic Signature(s) Signed: 12/11/2020 4:56:56 PM By: Baltazar Najjar MD Entered By: Baltazar Najjar on 12/11/2020 16:39:27

## 2020-12-11 NOTE — Progress Notes (Signed)
Barbara, Lucero ([id number]) Visit Report for 12/11/2020 Arrival Information Details Patient Name: Date of Service: Barbara Lucero 12/11/2020 2:00 PM Medical Record Number: [id number] Patient Account Number: [id number] Date of Birth/Sex: Treating RN: [birth year] (80 y.o. Barbara Lucero, Barbara Lucero Primary Care Shawna Wearing: [name] Other Clinician: Referring Karem Farha: Treating Vergia Chea/Extender: [name] in Treatment: 15 Visit Information History Since Last Visit Added or deleted any medications: No Patient Arrived: Dan Lucero Any new allergies or adverse reactions: No Arrival Time: 14:43 Had a fall or experienced change in No Accompanied By: family activities of daily living that may affect Transfer Assistance: None risk of falls: Patient Identification Verified: Yes Signs or symptoms of abuse/neglect since last visito No Secondary Verification Process Completed: Yes Hospitalized since last visit: No Patient Requires Transmission-Based Precautions: No Implantable device outside of the clinic excluding No Patient Has Alerts: No cellular tissue based products placed in the center since last visit: Has Dressing in Place as Prescribed: Yes Pain Present Now: No Electronic Signature(s) Signed: 12/11/2020 4:50:06 PM By: Fonnie Mu RN Entered By: Fonnie Mu on 12/11/2020 14:46:13 -------------------------------------------------------------------------------- Compression Therapy Details Patient Name: Date of Service: Barbara Lucero. 12/11/2020 2:00 PM Medical Record Number: [id number] Patient Account Number: [id number] Date of Birth/Sex: Treating RN: [birth year] (80 y.o. Barbara Lucero, Barbara Lucero Primary Care Miguel Medal: [name] Other Clinician: Referring Doniel Maiello: Treating Taos Tapp/Extender: [name] in Treatment: 15 Compression Therapy Performed for Wound Assessment: Wound #4 Left,Lateral Ankle Performed By: Clinician Fonnie Mu, RN Compression Type: Three Layer Post Procedure Diagnosis Same as Pre-procedure Electronic Signature(s) Signed: 12/11/2020 4:50:06 PM By: Fonnie Mu RN Entered By: Fonnie Mu on 12/11/2020 15:35:45 -------------------------------------------------------------------------------- Compression Therapy Details Patient Name: Date of Service: Barbara Lucero. 12/11/2020 2:00 PM Medical Record Number: [id number] Patient Account Number: [id number] Date of Birth/Sex: Treating RN: 1940-03-24 (80 y.o. Barbara Lucero, Barbara Lucero Primary Care Mikita Lesmeister: [name] Other Clinician: Referring Linnie Delgrande: Treating Arnol Mcgibbon/Extender: [name] in Treatment: 15 Compression Therapy Performed for Wound Assessment: Wound #6 Right,Lateral Ankle Performed By: Clinician Fonnie Mu, RN Compression Type: Three Layer Post Procedure Diagnosis Same as Pre-procedure Electronic Signature(s) Signed: 12/11/2020 4:50:06 PM By: Fonnie Mu RN Entered By: Fonnie Mu on 12/11/2020 15:35:45 -------------------------------------------------------------------------------- Encounter Discharge Information Details Patient Name: Date of Service: Barbara Lucero. 12/11/2020 2:00 PM Medical Record Number: [id number] Patient Account Number: [id number] Date of Birth/Sex: Treating RN: 1940-06-08 (80 y.o. Barbara Lucero, Barbara Lucero Primary Care Myla Mauriello: [name] Other Clinician: Referring Malek Skog: Treating Keneth Borg/Extender: [name] in Treatment: 15 Encounter Discharge Information Items Discharge Condition: Stable Ambulatory Status: Walker Discharge Destination: Home Transportation: Private Auto Accompanied By: family Schedule Follow-up Appointment: Yes Clinical Summary of Care: Patient Declined Electronic Signature(s) Signed: 12/11/2020 4:50:06 PM By: Fonnie Mu RN Entered By: Fonnie Mu on 12/11/2020  15:59:44 -------------------------------------------------------------------------------- Lower Extremity Assessment Details Patient Name: Date of Service: Barbara Lucero. 12/11/2020 2:00 PM Medical Record Number: [id number] Patient Account Number: [id number] Date of Birth/Sex: Treating RN: August 16, 1940 (80 y.o. Barbara Lucero, Barbara Lucero Primary Care Sharronda Schweers: [name] Other Clinician: Referring Jule Whitsel: Treating Sharley Keeler/Extender: [name] in Treatment: 15 Edema Assessment Assessed: [name]: Yes] [name]: Yes] Edema: [Left: Yes] [Right: Yes] Calf Left: Right: Point of Measurement: From Medial Instep 30.5 cm 36.3 cm Ankle Left: Right: Point of Measurement: From Medial Instep 22.1 cm 20.8 cm Vascular Assessment Pulses: Dorsalis Pedis Palpable: [Left:Yes] [Right:Yes] Posterior Tibial Palpable: [Left:Yes] [Right:Yes] Electronic Signature(s) Signed:  12/11/2020 4:50:06 PM By: Fonnie Mu RN Entered By: Fonnie Mu on 12/11/2020 14:46:59 -------------------------------------------------------------------------------- Multi Wound Chart Details Patient Name: Date of Service: Barbara Lucero. 12/11/2020 2:00 PM Medical Record Number: [id number] Patient Account Number: [id number] Date of Birth/Sex: Treating RN: [birth year] (80 y.o. Barbara Lucero, Barbara Lucero Primary Care Nicky Kras: [name] Other Clinician: Referring Barabara Motz: Treating Dennison Mcdaid/Extender: [name] in Treatment: 15 Vital Signs Height(in): 63 Pulse(bpm): Weight(lbs): 182 Blood Pressure(mmHg): Body Mass Index(BMI): 32 Temperature(F): 97.8 Respiratory Rate(breaths/min): 17 Photos: Left, Medial Lower Leg Left, Lateral Ankle Right, Anterior Lower Leg Wound Location: Blister Blister Gradually Appeared Wounding Event: Venous Leg Ulcer Venous Leg Ulcer Venous Leg Ulcer Primary Etiology: Lymphedema N/A N/A Secondary Etiology: Cataracts, Deep Vein Thrombosis,  Cataracts, Deep Vein Thrombosis, Cataracts, Deep Vein Thrombosis, Comorbid History: Peripheral Venous Disease, Crohns, Peripheral Venous Disease, Crohns, Peripheral Venous Disease, Crohns, Osteoarthritis, Confinement Anxiety Osteoarthritis, Confinement Anxiety Osteoarthritis, Confinement Anxiety 11/26/2020 11/26/2020 11/26/2020 Date Acquired: 1 1 1  Weeks of Treatment: Open Open Open Wound Status: 0x0x0 0.4x0.3x0.1 0x0x0 Measurements L x W x D (cm) 0 0.094 0 A (cm) : rea 0 0.009 0 Volume (cm) : 100.00% 40.10% 100.00% % Reduction in Area: 100.00% 43.80% 100.00% % Reduction in Volume: Full Thickness Without Exposed Full Thickness Without Exposed Full Thickness Without Exposed Classification: Support Structures Support Structures Support Structures Medium Medium Medium Exudate Amount: Serous Serous Serous Exudate Type: Psychologist, forensic Exudate Color: Flat and Intact Flat and Intact Flat and Intact Wound Margin: Large (67-100%) Large (67-100%) Large (67-100%) Granulation Amount: Red Pink Red Granulation Quality: None Present (0%) N/A None Present (0%) Necrotic Amount: Fat Layer (Subcutaneous Tissue): Yes Fat Layer (Subcutaneous Tissue): Yes Fat Layer (Subcutaneous Tissue): Yes Exposed Structures: Fascia: No Fascia: No Fascia: No Tendon: No Tendon: No Tendon: No Muscle: No Muscle: No Muscle: No Joint: No Joint: No Joint: No Bone: No Bone: No Bone: No Small (1-33%) Large (67-100%) Small (1-33%) Epithelialization: N/A Compression Therapy N/A Procedures Performed: Wound Number: 6 N/A N/A Photos: N/A N/A Right, Lateral Ankle N/A N/A Wound Location: Gradually Appeared N/A N/A Wounding Event: Venous Leg Ulcer N/A N/A Primary Etiology: Lymphedema N/A N/A Secondary Etiology: Cataracts, Deep Vein Thrombosis, N/A N/A Comorbid History: Peripheral Venous Disease, Crohns, Osteoarthritis, Confinement Anxiety 11/26/2020 N/A N/A Date Acquired: 1 N/A N/A Weeks of  Treatment: Open N/A N/A Wound Status: 5.5x3.4x0.1 N/A N/A Measurements L x W x D (cm) 14.687 N/A N/A A (cm) : rea 1.469 N/A N/A Volume (cm) : -642.10% N/A N/A % Reduction in Area: -641.90% N/A N/A % Reduction in Volume: Full Thickness Without Exposed N/A N/A Classification: Support Structures Large N/A N/A Exudate Amount: Serous N/A N/A Exudate Type: amber N/A N/A Exudate Color: Indistinct, nonvisible N/A N/A Wound Margin: Small (1-33%) N/A N/A Granulation Amount: Pink N/A N/A Granulation Quality: None Present (0%) N/A N/A Necrotic Amount: Fat Layer (Subcutaneous Tissue): Yes N/A N/A Exposed Structures: Fascia: No Tendon: No Muscle: No Joint: No Bone: No Large (67-100%) N/A N/A Epithelialization: Compression Therapy N/A N/A Procedures Performed: Treatment Notes Wound #3 (Lower Leg) Wound Laterality: Left, Medial Cleanser Peri-Wound Care Topical Primary Dressing Secondary Dressing Secured With Compression Wrap Compression Stockings Add-Ons Wound #3 (Lower Leg) Wound Laterality: Left, Medial Cleanser Peri-Wound Care Topical Primary Dressing Secondary Dressing Secured With Compression Wrap Compression Stockings Add-Ons Wound #4 (Ankle) Wound Laterality: Left, Lateral Cleanser Peri-Wound Care Triamcinolone 15 (g) Discharge Instruction: Use triamcinolone 15 (g) as directed Sween Lotion (Moisturizing lotion) Discharge Instruction: Apply moisturizing lotion as directed  Topical Primary Dressing KerraCel Ag Gelling Fiber Dressing, 2x2 in (silver alginate) Discharge Instruction: Apply silver alginate to wound bed as instructed Secondary Dressing Woven Gauze Sponge, Non-Sterile 4x4 in Discharge Instruction: Apply over primary dressing as directed. ABD Pad, 8x10 Discharge Instruction: Apply over primary dressing as directed. Secured With Compression Wrap Kerlix Roll 4.5x3.1 (in/yd) Discharge Instruction: Apply Kerlix and Coban compression as  directed. Coban Self-Adherent Wrap 4x5 (in/yd) Discharge Instruction: Apply over Kerlix as directed. Compression Stockings Add-Ons Wound #5 (Lower Leg) Wound Laterality: Right, Anterior Cleanser Peri-Wound Care Topical Primary Dressing Secondary Dressing Secured With Compression Wrap Compression Stockings Add-Ons Wound #5 (Lower Leg) Wound Laterality: Right, Anterior Cleanser Peri-Wound Care Topical Primary Dressing Secondary Dressing Secured With Compression Wrap Compression Stockings Add-Ons Wound #6 (Ankle) Wound Laterality: Right, Lateral Cleanser Peri-Wound Care Triamcinolone 15 (g) Discharge Instruction: Use triamcinolone 15 (g) as directed Sween Lotion (Moisturizing lotion) Discharge Instruction: Apply moisturizing lotion as directed Topical Primary Dressing KerraCel Ag Gelling Fiber Dressing, 2x2 in (silver alginate) Discharge Instruction: Apply silver alginate to wound bed as instructed Secondary Dressing Woven Gauze Sponge, Non-Sterile 4x4 in Discharge Instruction: Apply over primary dressing as directed. ABD Pad, 8x10 Discharge Instruction: Apply over primary dressing as directed. Secured With Compression Wrap Kerlix Roll 4.5x3.1 (in/yd) Discharge Instruction: Apply Kerlix and Coban compression as directed. Coban Self-Adherent Wrap 4x5 (in/yd) Discharge Instruction: Apply over Kerlix as directed. Compression Stockings Add-Ons Electronic Signature(s) Signed: 12/11/2020 4:50:06 PM By: Fonnie Mu RN Signed: 12/11/2020 4:56:56 PM By: Baltazar Najjar MD Entered By: Baltazar Najjar on 12/11/2020 16:32:03 -------------------------------------------------------------------------------- Multi-Disciplinary Care Plan Details Patient Name: Date of Service: Barbara Lucero. 12/11/2020 2:00 PM Medical Record Number: [id number] Patient Account Number: [id number] Date of Birth/Sex: Treating RN: Jul 15, 1940 (80 y.o. Barbara Lucero, Barbara Lucero Primary Care  Blessed Cotham: [name] Other Clinician: Referring Karliah Kowalchuk: Treating Taron Mondor/Extender: [name] in Treatment: 15 Multidisciplinary Care Plan reviewed with physician Active Inactive Venous Leg Ulcer Nursing Diagnoses: Potential for venous Insuffiency (use before diagnosis confirmed) Goals: Patient will maintain optimal edema control Date Initiated: 08/23/2020 Target Resolution Date: 01/08/2021 Goal Status: Active Interventions: Assess peripheral edema status every visit. Provide education on venous insufficiency Treatment Activities: Non-invasive vascular studies : 08/23/2020 T ordered outside of clinic : 08/23/2020 est Therapeutic compression applied : 08/23/2020 Notes: Wound/Skin Impairment Nursing Diagnoses: Knowledge deficit related to ulceration/compromised skin integrity Goals: Patient/caregiver will verbalize understanding of skin care regimen Date Initiated: 08/23/2020 Target Resolution Date: 01/08/2021 Goal Status: Active Ulcer/skin breakdown will have a volume reduction of 30% by week 4 Date Initiated: 10/01/2020 Target Resolution Date: 01/08/2021 Goal Status: Active Interventions: Assess patient/caregiver ability to obtain necessary supplies Assess patient/caregiver ability to perform ulcer/skin care regimen upon admission and as needed Provide education on ulcer and skin care Treatment Activities: Skin care regimen initiated : 08/23/2020 Topical wound management initiated : 08/23/2020 Notes: Electronic Signature(s) Signed: 12/11/2020 4:50:06 PM By: Fonnie Mu RN Entered By: Fonnie Mu on 12/11/2020 15:07:24 -------------------------------------------------------------------------------- Pain Assessment Details Patient Name: Date of Service: Barbara Lucero. 12/11/2020 2:00 PM Medical Record Number: [id number] Patient Account Number: [id number] Date of Birth/Sex: Treating RN: 07-04-1940 (81 y.o. Barbara Lucero, Barbara Lucero Primary  Care Andersen Mckiver: [name] Other Clinician: Referring Ireland Chagnon: Treating Esiquio Boesen/Extender: [name] in Treatment: 15 Active Problems Location of Pain Severity and Description of Pain Patient Has Paino No Site Locations Pain Management and Medication Current Pain Management: Electronic Signature(s) Signed: 12/11/2020 4:50:06 PM By: Fonnie Mu RN Entered By: Fonnie Mu on  12/11/2020 14:46:48 -------------------------------------------------------------------------------- Patient/Caregiver Education Details Patient Name: Date of Service: Barbara Lucero 10/18/2022andnbsp2:00 PM Medical Record Number: [id number] Patient Account Number: [id number] Date of Birth/Gender: Treating RN: 08/12/1940 (80 y.o. Barbara Lucero, Barbara Lucero Primary Care Physician: [name] Other Clinician: Referring Physician: Treating Physician/Extender: [name] in Treatment: 15 Education Assessment Education Provided To: Patient Education Topics Provided Venous: Methods: Explain/Verbal Responses: State content correctly Electronic Signature(s) Signed: 12/11/2020 4:50:06 PM By: Fonnie Mu RN Entered By: Fonnie Mu on 12/11/2020 15:34:52 -------------------------------------------------------------------------------- Wound Assessment Details Patient Name: Date of Service: Barbara Lucero. 12/11/2020 2:00 PM Medical Record Number: [id number] Patient Account Number: [id number] Date of Birth/Sex: Treating RN: 1940/12/13 (80 y.o. Barbara Lucero, Barbara Lucero Primary Care Kimothy Kishimoto: [name] Other Clinician: Referring Edgel Degnan: Treating Ajah Vanhoose/Extender: [name] in Treatment: 15 Wound Status Wound Number: 3 Primary Venous Leg Ulcer Etiology: Wound Location: Left, Medial Lower Leg Secondary Lymphedema Wounding Event: Blister Etiology: Date Acquired: 11/26/2020 Wound Open Weeks Of Treatment:  1 Status: Clustered Wound: No Comorbid Cataracts, Deep Vein Thrombosis, Peripheral Venous Disease, History: Crohns, Osteoarthritis, Confinement Anxiety Photos Wound Measurements Length: (cm) Width: (cm) Depth: (cm) Area: (cm) Volume: (cm) 0 % Reduction in Area: 100% 0 % Reduction in Volume: 100% 0 Epithelialization: Small (1-33%) 0 0 Wound Description Classification: Full Thickness Without Exposed Support Structures Wound Margin: Flat and Intact Exudate Amount: Medium Exudate Type: Serous Exudate Color: amber Foul Odor After Cleansing: No Slough/Fibrino No Wound Bed Granulation Amount: Large (67-100%) Exposed Structure Granulation Quality: Red Fascia Exposed: No Necrotic Amount: None Present (0%) Fat Layer (Subcutaneous Tissue) Exposed: Yes Tendon Exposed: No Muscle Exposed: No Joint Exposed: No Bone Exposed: No Electronic Signature(s) Signed: 12/11/2020 4:50:06 PM By: Fonnie Mu RN Entered By: Fonnie Mu on 12/11/2020 14:58:53 -------------------------------------------------------------------------------- Wound Assessment Details Patient Name: Date of Service: Barbara Lucero. 12/11/2020 2:00 PM Medical Record Number: [id number] Patient Account Number: [id number] Date of Birth/Sex: Treating RN: 1940/07/02 (80 y.o. Barbara Lucero, Barbara Lucero Primary Care Anouk Critzer: [name] Other Clinician: Referring Ashe Gago: Treating Ilyas Lipsitz/Extender: [name] in Treatment: 15 Wound Status Wound Number: 4 Primary Venous Leg Ulcer Etiology: Wound Location: Left, Lateral Ankle Wound Open Wound Open Wounding Event: Blister Status: Date Acquired: 11/26/2020 Comorbid Cataracts, Deep Vein Thrombosis, Peripheral Venous Disease, Weeks Of Treatment: 1 History: Crohns, Osteoarthritis, Confinement Anxiety Clustered Wound: No Photos Wound Measurements Length: (cm) 0.4 Width: (cm) 0.3 Depth: (cm) 0.1 Area: (cm) 0.094 Volume: (cm)  0.009 % Reduction in Area: 40.1% % Reduction in Volume: 43.8% Epithelialization: Large (67-100%) Tunneling: No Undermining: No Wound Description Classification: Full Thickness Without Exposed Support Structures Wound Margin: Flat and Intact Exudate Amount: Medium Exudate Type: Serous Exudate Color: amber Foul Odor After Cleansing: No Slough/Fibrino No Wound Bed Granulation Amount: Large (67-100%) Exposed Structure Granulation Quality: Pink Fascia Exposed: No Fat Layer (Subcutaneous Tissue) Exposed: Yes Tendon Exposed: No Muscle Exposed: No Joint Exposed: No Bone Exposed: No Treatment Notes Wound #4 (Ankle) Wound Laterality: Left, Lateral Cleanser Peri-Wound Care Triamcinolone 15 (g) Discharge Instruction: Use triamcinolone 15 (g) as directed Sween Lotion (Moisturizing lotion) Discharge Instruction: Apply moisturizing lotion as directed Topical Primary Dressing KerraCel Ag Gelling Fiber Dressing, 2x2 in (silver alginate) Discharge Instruction: Apply silver alginate to wound bed as instructed Secondary Dressing Woven Gauze Sponge, Non-Sterile 4x4 in Discharge Instruction: Apply over primary dressing as directed. ABD Pad, 8x10 Discharge Instruction: Apply over primary dressing as directed. Secured With Compression Wrap Kerlix Roll 4.5x3.1 (in/yd) Discharge Instruction:  Apply Kerlix and Coban compression as directed. Coban Self-Adherent Wrap 4x5 (in/yd) Discharge Instruction: Apply over Kerlix as directed. Compression Stockings Add-Ons Electronic Signature(s) Signed: 12/11/2020 4:50:06 PM By: Fonnie Mu RN Entered By: Fonnie Mu on 12/11/2020 14:59:43 -------------------------------------------------------------------------------- Wound Assessment Details Patient Name: Date of Service: Barbara Lucero. 12/11/2020 2:00 PM Medical Record Number: [id number] Patient Account Number: [id number] Date of Birth/Sex: Treating RN: 1940/10/14 (80 y.o. Barbara Lucero, Barbara Lucero Primary Care Keeghan Mcintire: [name] Other Clinician: Referring Jerico Grisso: Treating Rorie Delmore/Extender: [name] in Treatment: 15 Wound Status Wound Number: 5 Primary Venous Leg Ulcer Etiology: Wound Location: Right, Anterior Lower Leg Wound Open Wounding Event: Gradually Appeared Status: Date Acquired: 11/26/2020 Comorbid Cataracts, Deep Vein Thrombosis, Peripheral Venous Disease, Weeks Of Treatment: 1 History: Crohns, Osteoarthritis, Confinement Anxiety Clustered Wound: No Photos Wound Measurements Length: (cm) Width: (cm) Depth: (cm) Area: (cm) Volume: (cm) 0 % Reduction in Area: 100% 0 % Reduction in Volume: 100% 0 Epithelialization: Small (1-33%) 0 0 Wound Description Classification: Full Thickness Without Exposed Support Structures Wound Margin: Flat and Intact Exudate Amount: Medium Exudate Type: Serous Exudate Color: amber Foul Odor After Cleansing: No Slough/Fibrino No Wound Bed Granulation Amount: Large (67-100%) Exposed Structure Granulation Quality: Red Fascia Exposed: No Necrotic Amount: None Present (0%) Fat Layer (Subcutaneous Tissue) Exposed: Yes Tendon Exposed: No Muscle Exposed: No Joint Exposed: No Bone Exposed: No Electronic Signature(s) Signed: 12/11/2020 4:50:06 PM By: Fonnie Mu RN Entered By: Fonnie Mu on 12/11/2020 15:00:22 -------------------------------------------------------------------------------- Wound Assessment Details Patient Name: Date of Service: Barbara Lucero. 12/11/2020 2:00 PM Medical Record Number: [id number] Patient Account Number: [id number] Date of Birth/Sex: Treating RN: 04/05/1940 (80 y.o. Barbara Lucero, Barbara Lucero Primary Care Marlo Arriola: [name] Other Clinician: Referring Doyt Castellana: Treating Marilee Ditommaso/Extender: [name] in Treatment: 15 Wound Status Wound Number: 6 Primary Venous Leg Ulcer Etiology: Wound Location: Right, Lateral  Ankle Secondary Lymphedema Wounding Event: Gradually Appeared Etiology: Date Acquired: 11/26/2020 Wound Open Weeks Of Treatment: 1 Status: Clustered Wound: No Comorbid Cataracts, Deep Vein Thrombosis, Peripheral Venous Disease, History: Crohns, Osteoarthritis, Confinement Anxiety Photos Wound Measurements Length: (cm) 5.5 Width: (cm) 3.4 Depth: (cm) 0.1 Area: (cm) 14.687 Volume: (cm) 1.469 % Reduction in Area: -642.1% % Reduction in Volume: -641.9% Epithelialization: Large (67-100%) Tunneling: No Undermining: No Wound Description Classification: Full Thickness Without Exposed Support Structures Wound Margin: Indistinct, nonvisible Exudate Amount: Large Exudate Type: Serous Exudate Color: amber Foul Odor After Cleansing: No Slough/Fibrino No Wound Bed Granulation Amount: Small (1-33%) Exposed Structure Granulation Quality: Pink Fascia Exposed: No Necrotic Amount: None Present (0%) Fat Layer (Subcutaneous Tissue) Exposed: Yes Tendon Exposed: No Muscle Exposed: No Joint Exposed: No Bone Exposed: No Treatment Notes Wound #6 (Ankle) Wound Laterality: Right, Lateral Cleanser Peri-Wound Care Triamcinolone 15 (g) Discharge Instruction: Use triamcinolone 15 (g) as directed Sween Lotion (Moisturizing lotion) Discharge Instruction: Apply moisturizing lotion as directed Topical Primary Dressing KerraCel Ag Gelling Fiber Dressing, 2x2 in (silver alginate) Discharge Instruction: Apply silver alginate to wound bed as instructed Secondary Dressing Woven Gauze Sponge, Non-Sterile 4x4 in Discharge Instruction: Apply over primary dressing as directed. ABD Pad, 8x10 Discharge Instruction: Apply over primary dressing as directed. Secured With Compression Wrap Kerlix Roll 4.5x3.1 (in/yd) Discharge Instruction: Apply Kerlix and Coban compression as directed. Coban Self-Adherent Wrap 4x5 (in/yd) Discharge Instruction: Apply over Kerlix as directed. Compression  Stockings Add-Ons Electronic Signature(s) Signed: 12/11/2020 4:50:06 PM By: Fonnie Mu RN Entered By: Fonnie Mu on 12/11/2020 15:01:12 -------------------------------------------------------------------------------- Vitals Details Patient Name: Date  of Service: Barbara Lucero 12/11/2020 2:00 PM Medical Record Number: [id number] Patient Account Number: [id number] Date of Birth/Sex: Treating RN: Oct 17, 1940 (80 y.o. Barbara Lucero, Barbara Lucero Primary Care Alissia Lory: [name] Other Clinician: Referring Ruchy Wildrick: Treating Kail Fraley/Extender: [name] in Treatment: 15 Vital Signs Time Taken: 14:46 Temperature (F): 97.8 Height (in): 63 Respiratory Rate (breaths/min): 17 Weight (lbs): 182 Reference Range: 80 - 120 mg / dl Body Mass Index (BMI): 32.2 Electronic Signature(s) Signed: 12/11/2020 4:50:06 PM By: Fonnie Mu RN Entered By: Fonnie Mu on 12/11/2020 14:46:38

## 2020-12-17 ENCOUNTER — Encounter (HOSPITAL_BASED_OUTPATIENT_CLINIC_OR_DEPARTMENT_OTHER): Payer: Medicare Other | Admitting: Internal Medicine

## 2020-12-17 ENCOUNTER — Other Ambulatory Visit: Payer: Self-pay

## 2020-12-17 ENCOUNTER — Ambulatory Visit: Payer: Medicare Other | Admitting: Surgery

## 2020-12-17 ENCOUNTER — Encounter: Payer: Self-pay | Admitting: Surgery

## 2020-12-17 VITALS — BP 148/84 | HR 82 | Temp 98.0°F | Resp 18 | Ht 64.0 in | Wt 184.0 lb

## 2020-12-17 DIAGNOSIS — I87332 Chronic venous hypertension (idiopathic) with ulcer and inflammation of left lower extremity: Secondary | ICD-10-CM | POA: Diagnosis not present

## 2020-12-17 DIAGNOSIS — I739 Peripheral vascular disease, unspecified: Secondary | ICD-10-CM | POA: Diagnosis not present

## 2020-12-17 DIAGNOSIS — L03116 Cellulitis of left lower limb: Secondary | ICD-10-CM | POA: Diagnosis not present

## 2020-12-17 DIAGNOSIS — K5090 Crohn's disease, unspecified, without complications: Secondary | ICD-10-CM | POA: Diagnosis not present

## 2020-12-17 DIAGNOSIS — I872 Venous insufficiency (chronic) (peripheral): Secondary | ICD-10-CM | POA: Diagnosis not present

## 2020-12-17 DIAGNOSIS — L97811 Non-pressure chronic ulcer of other part of right lower leg limited to breakdown of skin: Secondary | ICD-10-CM | POA: Diagnosis not present

## 2020-12-17 DIAGNOSIS — I87331 Chronic venous hypertension (idiopathic) with ulcer and inflammation of right lower extremity: Secondary | ICD-10-CM | POA: Diagnosis not present

## 2020-12-17 DIAGNOSIS — L03115 Cellulitis of right lower limb: Secondary | ICD-10-CM | POA: Diagnosis not present

## 2020-12-17 DIAGNOSIS — I890 Lymphedema, not elsewhere classified: Secondary | ICD-10-CM | POA: Diagnosis not present

## 2020-12-17 DIAGNOSIS — I87333 Chronic venous hypertension (idiopathic) with ulcer and inflammation of bilateral lower extremity: Secondary | ICD-10-CM | POA: Diagnosis not present

## 2020-12-17 DIAGNOSIS — L97921 Non-pressure chronic ulcer of unspecified part of left lower leg limited to breakdown of skin: Secondary | ICD-10-CM | POA: Diagnosis not present

## 2020-12-17 DIAGNOSIS — Z86718 Personal history of other venous thrombosis and embolism: Secondary | ICD-10-CM | POA: Diagnosis not present

## 2020-12-17 NOTE — Progress Notes (Signed)
[name], [name] ([id number]) Visit Report for 12/17/2020 Chief Complaint Document Details Patient Name: Date of Service: [name] 12/17/2020 2:00 PM Medical Record Number: [id number] Patient Account Number: [id number] Date of Birth/Sex: Treating RN: 12/10/1940 (80 y.o. Roel Cluck Primary Care Provider: [name] Other Clinician: Referring Provider: Treating Provider/Extender: Tomasita Crumble in Treatment: 16 Information Obtained from: Patient Chief Complaint 08/23/2020; this is a patient here for a weeping area on her right lateral lower leg just below the lateral malleolus 12/04/2020 patient presents with bilateral open wounds limited to skin breakdown and weeping area of right lower leg just below the lateral malleolus Electronic Signature(s) Signed: 12/17/2020 3:04:07 PM By: Geralyn Corwin DO Entered By: Geralyn Corwin on 12/17/2020 14:43:37 -------------------------------------------------------------------------------- HPI Details Patient Name: Date of Service: Barbara Lucero B. 12/17/2020 2:00 PM Medical Record Number: [id number] Patient Account Number: [id number] Date of Birth/Sex: Treating RN: 1940-08-30 (80 y.o. Roel Cluck Primary Care Provider: [name] Other Clinician: Referring Provider: Treating Provider/Extender: Tomasita Crumble in Treatment: 16 History of Present Illness HPI Description: ADMISSION 08/23/2020 This is a 80 year old woman who arrives accompanied by her sister-in-law. 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 She has been dealing with chronic edema and discoloration of her legs for several years per her sister-in-law. I note that she saw Dr. Myra Gianotti on 05/21/2020 and he noted an extensive left leg DVT in 2019 although she is no longer on oral anticoagulants. He felt she had chronic venous insufficiency with skin changes related to this. He ordered her 20/30 stockings which were zippered  stockings but I do not get the sense that the patient was all that compliant. In any case she started to develop weeping edema fluid recently coming out of the right lateral ankle serous drainage. She said it was pouring out last week. She saw her primary care provider on 08/08/2020 and was put on clindamycin which she is just finishing. She says the erythema feels better. Past medical history, aortic stenosis, chronic venous insufficiency, peripheral vascular disease, Crohn's disease which has been really recently quiescent, left hip fracture, DVT of the left leg in 2019. Notable that she has a very disabled son at home who she is the primary caregiver for Dr. Randie Heinz quoted an ABI of 1.12 on the right with a TBI of 0.65 on the left and 0.93 and a TBI of 0.58. In our clinic today the ABI was 0.83 on the right and 0.81 on the left 7/7 the patient's wound on the right lateral ankle is healed. Her edema control is much better. It turns out she does not have stockings but came in with Tubigrip on the left leg Readmission: 10/01/2020 on evaluation today patient appears to be doing decently well in regard to her leg. Unfortunately she does have an opening which happened as a result of her not wearing the compression socks she had something on the leg which was okay not necessarily the best but unfortunately did not include anything for the foot which is the main issue that we are finding here. With that being said I discussed with the patient that she really needs to have something for both and I think ordering her juxta lite compression wrap would be the ideal thing to do. 10/09/2020; patient I last saw in early July. At that point she had an area on the right lateral ankle which closed over. I am not really sure what happened however  she is readmitted to the clinic last week. She had an area on her right lateral ankle this is closed however she has another area on the right lateral foot also quite a bit of  discomfort on the right anterior foot. She has nothing open on the left leg. She comes in today with a single juxta lite stocking. She has a mid calf compression stocking she bought off [employer] on the left 10/16/2020; everything that we are concerned about on the right leg is healed including the right lateral ankle and right dorsal foot. She has another juxta light for the right leg, she had 1 on the left. The venous reflux studies I ordered last week are on Thursday. I will see her back 1 more time to review these. She had already been seen by Dr. Myra Gianotti 9/6; back in to discuss her reflux studies. This showed reflux in the greater saphenous vein on the right. I am going to send her back for Dr. Myra Gianotti to review these. She is wearing her juxta lite stockings but only had 20/30 mmHg compression. She became concerned this morning with erythema on the right leg. She says she has an appoint with Dr. Myra Gianotti in about 2 weeks 12/04/2020; patient presents after being discharged 1 month ago from our clinic. She states that she has been wearing her compression stockings intermittently. She has open wounds to her lower extremities bilaterally limited to skin breakdown. She has increased redness to her right foot and increased weeping to the right ankle area. She has increased pain to the right lower extremity as well. She has not followed up with vein and vascular since discharge from our clinic last month as recommended. 10/18; I see the patient was readmitted last week for again small weeping areas on the right lateral and a larger superficial weeping area on the left lateral lower legs she has an appointment with Dr. Myra Gianotti to review the reflux studies to see if anything could be ablated from a superficial vein point of view. We are using 3 layer compression on 10/24; patient presents for follow-up. She states she saw vein and vascular today. She reports that they recommended compression stocking. She  states that the 3 layer compression was uncomfortable and would like to go down to the Kerlix/Coban. She reports increased redness, Warmth and pain to the left lower extremity. Electronic Signature(s) Signed: 12/17/2020 3:04:07 PM By: Geralyn Corwin DO Entered By: Geralyn Corwin on 12/17/2020 14:45:46 -------------------------------------------------------------------------------- Physical Exam Details Patient Name: Date of Service: Barbara Lucero B. 12/17/2020 2:00 PM Medical Record Number: [id number] Patient Account Number: [id number] Date of Birth/Sex: Treating RN: Aug 09, 1940 (80 y.o. Roel Cluck Primary Care Provider: [name] Other Clinician: Referring Provider: Treating Provider/Extender: Tomasita Crumble in Treatment: 16 Constitutional respirations regular, non-labored and within target range for patient.. Cardiovascular 2+ dorsalis pedis/posterior tibialis pulses. Psychiatric pleasant and cooperative. Notes Left lateral ankle and right anterior shin with wounds limited to skin breakdown. Increased warmth and erythema to the left lower extremity. No purulent drainage. Electronic Signature(s) Signed: 12/17/2020 3:04:07 PM By: Geralyn Corwin DO Entered By: Geralyn Corwin on 12/17/2020 14:46:25 -------------------------------------------------------------------------------- Physician Orders Details Patient Name: Date of Service: Barbara Lucero B. 12/17/2020 2:00 PM Medical Record Number: [id number] Patient Account Number: [id number] Date of Birth/Sex: Treating RN: 20-Apr-1940 (80 y.o. [name] Primary Care Provider: [name] Other Clinician: Referring Provider: Treating Provider/Extender: Tomasita Crumble in Treatment: [phone] Verbal / Phone Orders: No  Diagnosis Coding ICD-10 Coding Code Description I87.331 Chronic venous hypertension (idiopathic) with ulcer and inflammation of right lower extremity L97.811  Non-pressure chronic ulcer of other part of right lower leg limited to breakdown of skin I87.332 Chronic venous hypertension (idiopathic) with ulcer and inflammation of left lower extremity L97.921 Non-pressure chronic ulcer of unspecified part of left lower leg limited to breakdown of skin I89.0 Lymphedema, not elsewhere classified L03.115 Cellulitis of right lower limb Follow-up Appointments ppointment in 1 week. - Dr. Leanord Hawking Tuesday Return A Pick up oral antibiotics at pharmacy today related to left leg warmness and redness. Bathing/ Shower/ Hygiene May shower with protection but do not get wound dressing(s) wet. Edema Control - Lymphedema / SCD / Other Bilateral Lower Extremities Elevate legs to the level of the heart or above for 30 minutes daily and/or when sitting, a frequency of: - throughout the day Avoid standing for long periods of time. Exercise regularly Wound Treatment Wound #4 - Ankle Wound Laterality: Left, Lateral Peri-Wound Care: Triamcinolone 15 (g) 1 x Per Week/30 Days Discharge Instructions: Use triamcinolone 15 (g) as directed Peri-Wound Care: Sween Lotion (Moisturizing lotion) 1 x Per Week/30 Days Discharge Instructions: Apply moisturizing lotion as directed Prim Dressing: KerraCel Ag Gelling Fiber Dressing, 2x2 in (silver alginate) 1 x Per Week/30 Days ary Discharge Instructions: Apply silver alginate to wound bed as instructed Secondary Dressing: Woven Gauze Sponge, Non-Sterile 4x4 in 1 x Per Week/30 Days Discharge Instructions: Apply over primary dressing as directed. Secondary Dressing: ABD Pad, 8x10 1 x Per Week/30 Days Discharge Instructions: Apply over primary dressing as directed. Compression Wrap: Kerlix Roll 4.5x3.1 (in/yd) 1 x Per Week/30 Days Discharge Instructions: Apply Kerlix and Coban compression as directed. Compression Wrap: Coban Self-Adherent Wrap 4x5 (in/yd) 1 x Per Week/30 Days Discharge Instructions: Apply over Kerlix as directed. Wound  #7 - Lower Leg Wound Laterality: Right, Anterior Peri-Wound Care: Triamcinolone 15 (g) 1 x Per Week/30 Days Discharge Instructions: Use triamcinolone 15 (g) as directed Peri-Wound Care: Sween Lotion (Moisturizing lotion) 1 x Per Week/30 Days Discharge Instructions: Apply moisturizing lotion as directed Prim Dressing: KerraCel Ag Gelling Fiber Dressing, 2x2 in (silver alginate) 1 x Per Week/30 Days ary Discharge Instructions: Apply silver alginate to wound bed as instructed Secondary Dressing: Woven Gauze Sponge, Non-Sterile 4x4 in 1 x Per Week/30 Days Discharge Instructions: Apply over primary dressing as directed. Secondary Dressing: ABD Pad, 8x10 1 x Per Week/30 Days Discharge Instructions: Apply over primary dressing as directed. Compression Wrap: Kerlix Roll 4.5x3.1 (in/yd) 1 x Per Week/30 Days Discharge Instructions: Apply Kerlix and Coban compression as directed. Compression Wrap: Coban Self-Adherent Wrap 4x5 (in/yd) 1 x Per Week/30 Days Discharge Instructions: Apply over Kerlix as directed. Patient Medications llergies: coconut, Latex, Natural Rubber A Notifications Medication Indication Start End 12/17/2020 Augmentin DOSE 1 - oral 875 mg-125 mg tablet - 1 tablet oral BID x 7 days Electronic Signature(s) Signed: 12/17/2020 2:58:52 PM By: Geralyn Corwin DO Entered By: Geralyn Corwin on 12/17/2020 14:58:51 -------------------------------------------------------------------------------- Problem List Details Patient Name: Date of Service: Barbara Lucero B. 12/17/2020 2:00 PM Medical Record Number: [id number] Patient Account Number: [id number] Date of Birth/Sex: Treating RN: 04-03-1940 (80 y.o. [name] Primary Care Provider: [name] Other Clinician: Referring Provider: Treating Provider/Extender: Tomasita Crumble in Treatment: 16 Active Problems ICD-10 Encounter Code Description Active Date MDM Diagnosis I87.331 Chronic venous hypertension  (idiopathic) with ulcer and inflammation of right 08/23/2020 No Yes lower extremity L97.811 Non-pressure chronic ulcer of other part of  right lower leg limited to breakdown 08/23/2020 No Yes of skin I87.332 Chronic venous hypertension (idiopathic) with ulcer and inflammation of left 12/04/2020 No Yes lower extremity L97.921 Non-pressure chronic ulcer of unspecified part of left lower leg limited to 12/04/2020 No Yes breakdown of skin I89.0 Lymphedema, not elsewhere classified 08/23/2020 No Yes L03.115 Cellulitis of right lower limb 10/09/2020 No Yes Inactive Problems Resolved Problems Electronic Signature(s) Signed: 12/17/2020 3:04:07 PM By: Geralyn Corwin DO Entered By: Geralyn Corwin on 12/17/2020 14:43:24 -------------------------------------------------------------------------------- Progress Note Details Patient Name: Date of Service: Barbara Lucero B. 12/17/2020 2:00 PM Medical Record Number: [id number] Patient Account Number: [id number] Date of Birth/Sex: Treating RN: [birth year] (80 y.o. Roel Cluck Primary Care Provider: [name] Other Clinician: Referring Provider: Treating Provider/Extender: Tomasita Crumble in Treatment: 16 Subjective Chief Complaint Information obtained from Patient 08/23/2020; this is a patient here for a weeping area on her right lateral lower leg just below the lateral malleolus 12/04/2020 patient presents with bilateral open wounds limited to skin breakdown and weeping area of right lower leg just below the lateral malleolus History of Present Illness (HPI) ADMISSION 08/23/2020 This is a 80 year old woman who arrives accompanied by her sister-in-law. 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 She has been dealing with chronic edema and discoloration of her legs for several years per her sister-in-law. I note that she saw Dr. Myra Gianotti on 05/21/2020 and he noted an extensive left leg DVT in 2019 although she is no longer on  oral anticoagulants. He felt she had chronic venous insufficiency with skin changes related to this. He ordered her 20/30 stockings which were zippered stockings but I do not get the sense that the patient was all that compliant. In any case she started to develop weeping edema fluid recently coming out of the right lateral ankle serous drainage. She said it was pouring out last week. She saw her primary care provider on 08/08/2020 and was put on clindamycin which she is just finishing. She says the erythema feels better. Past medical history, aortic stenosis, chronic venous insufficiency, peripheral vascular disease, Crohn's disease which has been really recently quiescent, left hip fracture, DVT of the left leg in 2019. Notable that she has a very disabled son at home who she is the primary caregiver for Dr. Randie Heinz quoted an ABI of 1.12 on the right with a TBI of 0.65 on the left and 0.93 and a TBI of 0.58. In our clinic today the ABI was 0.83 on the right and 0.81 on the left 7/7 the patient's wound on the right lateral ankle is healed. Her edema control is much better. It turns out she does not have stockings but came in with Tubigrip on the left leg Readmission: 10/01/2020 on evaluation today patient appears to be doing decently well in regard to her leg. Unfortunately she does have an opening which happened as a result of her not wearing the compression socks she had something on the leg which was okay not necessarily the best but unfortunately did not include anything for the foot which is the main issue that we are finding here. With that being said I discussed with the patient that she really needs to have something for both and I think ordering her juxta lite compression wrap would be the ideal thing to do. 10/09/2020; patient I last saw in early July. At that point she had an area on the right lateral ankle which closed over. I am not  really sure what happened however she is readmitted to the  clinic last week. She had an area on her right lateral ankle this is closed however she has another area on the right lateral foot also quite a bit of discomfort on the right anterior foot. She has nothing open on the left leg. She comes in today with a single juxta lite stocking. She has a mid calf compression stocking she bought off [employer] on the left 10/16/2020; everything that we are concerned about on the right leg is healed including the right lateral ankle and right dorsal foot. She has another juxta light for the right leg, she had 1 on the left. The venous reflux studies I ordered last week are on Thursday. I will see her back 1 more time to review these. She had already been seen by Dr. Myra Gianotti 9/6; back in to discuss her reflux studies. This showed reflux in the greater saphenous vein on the right. I am going to send her back for Dr. Myra Gianotti to review these. She is wearing her juxta lite stockings but only had 20/30 mmHg compression. She became concerned this morning with erythema on the right leg. She says she has an appoint with Dr. Myra Gianotti in about 2 weeks 12/04/2020; patient presents after being discharged 1 month ago from our clinic. She states that she has been wearing her compression stockings intermittently. She has open wounds to her lower extremities bilaterally limited to skin breakdown. She has increased redness to her right foot and increased weeping to the right ankle area. She has increased pain to the right lower extremity as well. She has not followed up with vein and vascular since discharge from our clinic last month as recommended. 10/18; I see the patient was readmitted last week for again small weeping areas on the right lateral and a larger superficial weeping area on the left lateral lower legs she has an appointment with Dr. Myra Gianotti to review the reflux studies to see if anything could be ablated from a superficial vein point of view. We are using 3 layer compression  on 10/24; patient presents for follow-up. She states she saw vein and vascular today. She reports that they recommended compression stocking. She states that the 3 layer compression was uncomfortable and would like to go down to the Kerlix/Coban. She reports increased redness, Warmth and pain to the left lower extremity. Patient History Information obtained from Patient. Family History Cancer - Siblings, Diabetes - Mother, Heart Disease - Mother,Father, Hypertension - Mother,Father, Stroke - Mother, Thyroid Problems - Siblings, No family history of Hereditary Spherocytosis, Kidney Disease, Lung Disease, Seizures, Tuberculosis. Social History Never smoker, Marital Status - Widowed, Alcohol Use - Never, Drug Use - No History, Caffeine Use - Rarely. Medical History Eyes Patient has history of Cataracts - bil removed Ear/Nose/Mouth/Throat Denies history of Chronic sinus problems/congestion, Middle ear problems Cardiovascular Patient has history of Deep Vein Thrombosis - left, Peripheral Venous Disease Gastrointestinal Patient has history of Crohnoos Integumentary (Skin) Denies history of History of Burn Musculoskeletal Patient has history of Osteoarthritis Oncologic Denies history of Received Chemotherapy, Received Radiation Psychiatric Patient has history of Confinement Anxiety - mild Denies history of Anorexia/bulimia Hospitalization/Surgery History - left hip repair. - tubal ligation. Medical A Surgical History Notes nd Ear/Nose/Mouth/Throat occaissional dysphagia Cardiovascular aortic valve stenosis Gastrointestinal Genella Rife Genitourinary stress incontinence Musculoskeletal chronic back pain Objective Constitutional respirations regular, non-labored and within target range for patient.. Vitals Time Taken: 1:05 PM, Height: 63 in, Weight: 182 lbs, BMI:  32.2, Temperature: 98.2 F, Pulse: 71 bpm, Respiratory Rate: 20 breaths/min, Blood Pressure: 155/60  mmHg. Cardiovascular 2+ dorsalis pedis/posterior tibialis pulses. Psychiatric pleasant and cooperative. General Notes: Left lateral ankle and right anterior shin with wounds limited to skin breakdown. Increased warmth and erythema to the left lower extremity. No purulent drainage. Integumentary (Hair, Skin) Wound #4 status is Open. Original cause of wound was Blister. The date acquired was: 11/26/2020. The wound has been in treatment 1 weeks. The wound is located on the Left,Lateral Ankle. The wound measures 2.5cm length x 1cm width x 0.1cm depth; 1.963cm^2 area and 0.196cm^3 volume. There is Fat Layer (Subcutaneous Tissue) exposed. There is no tunneling or undermining noted. There is a large amount of serous drainage noted. The wound margin is flat and intact. There is large (67-100%) pink granulation within the wound bed. Wound #6 status is Healed - Epithelialized. Original cause of wound was Gradually Appeared. The date acquired was: 11/26/2020. The wound has been in treatment 1 weeks. The wound is located on the Right,Lateral Ankle. The wound measures 0cm length x 0cm width x 0cm depth; 0cm^2 area and 0cm^3 volume. There is Fat Layer (Subcutaneous Tissue) exposed. There is no tunneling or undermining noted. There is a none present amount of drainage noted. The wound margin is indistinct and nonvisible. There is no granulation within the wound bed. There is no necrotic tissue within the wound bed. Wound #7 status is Open. Original cause of wound was Gradually Appeared. The date acquired was: 12/17/2020. The wound is located on the Right,Anterior Lower Leg. The wound measures 0.5cm length x 0.3cm width x 0.1cm depth; 0.118cm^2 area and 0.012cm^3 volume. There is Fat Layer (Subcutaneous Tissue) exposed. There is no tunneling or undermining noted. There is a large amount of serous drainage noted. The wound margin is distinct with the outline attached to the wound base. There is large (67-100%) pink,  pale granulation within the wound bed. There is no necrotic tissue within the wound bed. Assessment Active Problems ICD-10 Chronic venous hypertension (idiopathic) with ulcer and inflammation of right lower extremity Non-pressure chronic ulcer of other part of right lower leg limited to breakdown of skin Chronic venous hypertension (idiopathic) with ulcer and inflammation of left lower extremity Non-pressure chronic ulcer of unspecified part of left lower leg limited to breakdown of skin Lymphedema, not elsewhere classified Cellulitis of right lower limb Patient presents with a small new wound to the right anterior shin. Both wounds present are limited to skin breakdown. The left lower extremity has increased warmth and erythema. We will treat with Augmentin. She states she does not want Keflex. She also reports that she would like to go lower on compression as 3 layer was uncomfortable. We will go back to Kerlix/Coban. Will use silver alginate under compression. Follow-up in 1 week. Patient saw Dr. Myra Gianotti today with vascular surgery. She was not a candidate for venous intervention on the left. He recommended 20oo30 compression garment once wounds are healed. Plan Follow-up Appointments: Return Appointment in 1 week. - Dr. Leanord Hawking Tuesday Pick up oral antibiotics at pharmacy today related to left leg warmness and redness. Bathing/ Shower/ Hygiene: May shower with protection but do not get wound dressing(s) wet. Edema Control - Lymphedema / SCD / Other: Elevate legs to the level of the heart or above for 30 minutes daily and/or when sitting, a frequency of: - throughout the day Avoid standing for long periods of time. Exercise regularly The following medication(s) was prescribed: Augmentin oral 875 mg-125  mg tablet 1 1 tablet oral BID x 7 days starting 12/17/2020 WOUND #4: - Ankle Wound Laterality: Left, Lateral Peri-Wound Care: Triamcinolone 15 (g) 1 x Per Week/30 Days Discharge  Instructions: Use triamcinolone 15 (g) as directed Peri-Wound Care: Sween Lotion (Moisturizing lotion) 1 x Per Week/30 Days Discharge Instructions: Apply moisturizing lotion as directed Prim Dressing: KerraCel Ag Gelling Fiber Dressing, 2x2 in (silver alginate) 1 x Per Week/30 Days ary Discharge Instructions: Apply silver alginate to wound bed as instructed Secondary Dressing: Woven Gauze Sponge, Non-Sterile 4x4 in 1 x Per Week/30 Days Discharge Instructions: Apply over primary dressing as directed. Secondary Dressing: ABD Pad, 8x10 1 x Per Week/30 Days Discharge Instructions: Apply over primary dressing as directed. Com pression Wrap: Kerlix Roll 4.5x3.1 (in/yd) 1 x Per Week/30 Days Discharge Instructions: Apply Kerlix and Coban compression as directed. Com pression Wrap: Coban Self-Adherent Wrap 4x5 (in/yd) 1 x Per Week/30 Days Discharge Instructions: Apply over Kerlix as directed. WOUND #7: - Lower Leg Wound Laterality: Right, Anterior Peri-Wound Care: Triamcinolone 15 (g) 1 x Per Week/30 Days Discharge Instructions: Use triamcinolone 15 (g) as directed Peri-Wound Care: Sween Lotion (Moisturizing lotion) 1 x Per Week/30 Days Discharge Instructions: Apply moisturizing lotion as directed Prim Dressing: KerraCel Ag Gelling Fiber Dressing, 2x2 in (silver alginate) 1 x Per Week/30 Days ary Discharge Instructions: Apply silver alginate to wound bed as instructed Secondary Dressing: Woven Gauze Sponge, Non-Sterile 4x4 in 1 x Per Week/30 Days Discharge Instructions: Apply over primary dressing as directed. Secondary Dressing: ABD Pad, 8x10 1 x Per Week/30 Days Discharge Instructions: Apply over primary dressing as directed. Com pression Wrap: Kerlix Roll 4.5x3.1 (in/yd) 1 x Per Week/30 Days Discharge Instructions: Apply Kerlix and Coban compression as directed. Com pression Wrap: Coban Self-Adherent Wrap 4x5 (in/yd) 1 x Per Week/30 Days Discharge Instructions: Apply over Kerlix as  directed. 1. Silver alginate under Kerlix/Coban 2. Augmentin 3. Follow-up in 1 week Electronic Signature(s) Signed: 12/17/2020 3:04:07 PM By: Geralyn Corwin DO Entered By: Geralyn Corwin on 12/17/2020 15:03:44 -------------------------------------------------------------------------------- HxROS Details Patient Name: Date of Service: Barbara Lucero B. 12/17/2020 2:00 PM Medical Record Number: [id number] Patient Account Number: [id number] Date of Birth/Sex: Treating RN: Oct 20, 1940 (80 y.o. Roel Cluck Primary Care Provider: [name] Other Clinician: Referring Provider: Treating Provider/Extender: Tomasita Crumble in Treatment: 16 Information Obtained From Patient Eyes Medical History: Positive for: Cataracts - bil removed Ear/Nose/Mouth/Throat Medical History: Negative for: Chronic sinus problems/congestion; Middle ear problems Past Medical History Notes: occaissional dysphagia Cardiovascular Medical History: Positive for: Deep Vein Thrombosis - left; Peripheral Venous Disease Past Medical History Notes: aortic valve stenosis Gastrointestinal Medical History: Positive for: Crohns Past Medical History Notes: Gerd Genitourinary Medical History: Past Medical History Notes: stress incontinence Integumentary (Skin) Medical History: Negative for: History of Burn Musculoskeletal Medical History: Positive for: Osteoarthritis Past Medical History Notes: chronic back pain Oncologic Medical History: Negative for: Received Chemotherapy; Received Radiation Psychiatric Medical History: Positive for: Confinement Anxiety - mild Negative for: Anorexia/bulimia HBO Extended History Items Eyes: Cataracts Immunizations Pneumococcal Vaccine: Received Pneumococcal Vaccination: Yes Received Pneumococcal Vaccination On or After 60th Birthday: No Implantable Devices No devices added Hospitalization / Surgery History Type of  Hospitalization/Surgery left hip repair tubal ligation Family and Social History Cancer: Yes - Siblings; Diabetes: Yes - Mother; Heart Disease: Yes - Mother,Father; Hereditary Spherocytosis: No; Hypertension: Yes - Mother,Father; Kidney Disease: No; Lung Disease: No; Seizures: No; Stroke: Yes - Mother; Thyroid Problems: Yes - Siblings; Tuberculosis: No; Never smoker; Marital Status -  Widowed; Alcohol Use: Never; Drug Use: No History; Caffeine Use: Rarely; Financial Concerns: No; Food, Clothing or Shelter Needs: No; Support System Lacking: No; Transportation Concerns: No Electronic Signature(s) Signed: 12/17/2020 3:04:07 PM By: Geralyn Corwin DO Signed: 12/17/2020 4:46:21 PM By: Antonieta Iba Entered By: Geralyn Corwin on 12/17/2020 14:45:51 -------------------------------------------------------------------------------- SuperBill Details Patient Name: Date of Service: [name] 12/17/2020 Medical Record Number: [id number] Patient Account Number: [id number] Date of Birth/Sex: Treating RN: 02/23/1941 (80 y.o. [name], [name] Primary Care Provider: [name] Other Clinician: Referring Provider: Treating Provider/Extender: Tomasita Crumble in Treatment: 16 Diagnosis Coding ICD-10 Codes Code Description [phone] Chronic venous hypertension (idiopathic) with ulcer and inflammation of right lower extremity L97.811 Non-pressure chronic ulcer of other part of right lower leg limited to breakdown of skin I87.332 Chronic venous hypertension (idiopathic) with ulcer and inflammation of left lower extremity L97.921 Non-pressure chronic ulcer of unspecified part of left lower leg limited to breakdown of skin I89.0 Lymphedema, not elsewhere classified L03.116 Cellulitis of left lower limb Facility Procedures CPT4 Code: [id number] Description: [phone] - WOUND CARE VISIT-LEV 5 EST PT Modifier: Quantity: 1 Physician Procedures : CPT4 Code Description Modifier [id number]  [id number] - WC PHYS LEVEL 4 - EST PT ICD-10 Diagnosis Description I87.331 Chronic venous hypertension (idiopathic) with ulcer and inflammation of right lower extremity L97.921 Non-pressure chronic ulcer of unspecified  part of left lower leg limited to breakdown of skin I87.332 Chronic venous hypertension (idiopathic) with ulcer and inflammation of left lower extremity L03.116 Cellulitis of left lower limb Quantity: 1 Electronic Signature(s) Signed: 12/17/2020 3:04:07 PM By: Geralyn Corwin DO Entered By: Geralyn Corwin on 12/17/2020 15:03:50

## 2020-12-17 NOTE — Progress Notes (Signed)
[facility]  Patient name: Barbara Lucero MRN: [id number] DOB: 08-Jul-1940 Sex: female   REASON FOR VISIT:    Follow up  HISOTRY OF PRESENT ILLNESS:   Barbara Lucero is a 80 y.o. female, who I saw in 2019 for a extensive left leg DVT.  She was several months out from her initial presentation, and so no intervention was performed.  She was on anticoagulation back then.  This has subsequently been stopped.  I have recommended referral to hematology however it was felt that her DVT was more related to her Crohn's and so she is no longer on anticoagulation as she has not been experiencing any symptoms of a Crohn's flare.  She continues to have discoloration in both legs.  She has also been at the wound center for several months treating drainage from her legs with compression therapy.  She has also had cellulitis.  She has been evaluated with arterial studies which showed adequate blood flow to the feet   The patient has a history of Crohn's disease, anxiety and depression   PAST MEDICAL HISTORY:   Past Medical History:  Diagnosis Date   Anxiety    Arthritis    Crohn disease (HCC)    Depression    Varicose veins of bilateral lower extremities with other complications      FAMILY HISTORY:   Family History  Problem Relation Age of Onset   Breast cancer Sister    Breast cancer Sister    Other Son        disabled   Bipolar disorder Daughter     SOCIAL HISTORY:   Social History   Tobacco Use   Smoking status: Never   Smokeless tobacco: Never  Substance Use Topics   Alcohol use: No     ALLERGIES:   No Known Allergies   CURRENT MEDICATIONS:   Current Outpatient Medications  Medication Sig Dispense Refill   acetaminophen (TYLENOL) 650 MG CR tablet Take 650 mg by mouth every 8 (eight) hours as needed for pain.     Ascorbic Acid (VITAMIN C) 1000 MG tablet Take 1,000 mg by mouth.      b complex vitamins  tablet Take 1 tablet by mouth daily.     CALCIUM PO Take 1 tablet by mouth daily.     COLLAGEN PO Take by mouth as directed.     Multiple Vitamin (MULTIVITAMIN) tablet Take 1 tablet by mouth daily.     Nystatin (GERHARDT'S BUTT CREAM) CREA Apply 1 application topically 3 (three) times daily. 1 each 11   pantoprazole (PROTONIX) 40 MG tablet Take 40 mg by mouth daily.      potassium chloride 20 MEQ/15ML (10%) SOLN SMARTSIG:Milliliter(s) By Mouth     Psyllium (METAMUCIL PO) Take by mouth as directed.      sertraline (ZOLOFT) 25 MG tablet Take 25 mg by mouth as directed.     simvastatin (ZOCOR) 10 MG tablet Take 10 mg by mouth at bedtime.     sulfaSALAzine (AZULFIDINE) 500 MG tablet Take 500 mg by mouth 2 (two) times daily.      vitamin E 400 UNIT capsule Take 400 Units by mouth daily.     furosemide (LASIX) 20 MG tablet Take 20 mg by mouth daily. (Patient not taking: Reported on 12/17/2020)     mirabegron ER (MYRBETRIQ) 50 MG TB24 tablet Take 1 tablet (50 mg total) by mouth daily. At bedtime (Patient not taking: Reported on 12/17/2020) 30 tablet  11   No current facility-administered medications for this visit.    REVIEW OF SYSTEMS:   [X]  denotes positive finding, [ ]  denotes negative finding Cardiac  Comments:  Chest pain or chest pressure:    Shortness of breath upon exertion:    Short of breath when lying flat:    Irregular heart rhythm:        Vascular    Pain in calf, thigh, or hip brought on by ambulation:    Pain in feet at night that wakes you up from your sleep:     Blood clot in your veins:    Leg swelling:         Pulmonary    Oxygen at home:    Productive cough:     Wheezing:         Neurologic    Sudden weakness in arms or legs:     Sudden numbness in arms or legs:     Sudden onset of difficulty speaking or slurred speech:    Temporary loss of vision in one eye:     Problems with dizziness:         Gastrointestinal    Blood in stool:     Vomited blood:          Genitourinary    Burning when urinating:     Blood in urine:        Psychiatric    Major depression:         Hematologic    Bleeding problems:    Problems with blood clotting too easily:        Skin    Rashes or ulcers:        Constitutional    Fever or chills:      PHYSICAL EXAM:   Vitals:   12/17/20 1206  BP: (!) 148/84  Pulse: 82  Resp: 18  Temp: 98 F (36.7 C)  SpO2: 92%  Weight: 83.5 kg  Height: 5\' 4"  (1.626 m)    GENERAL: The patient is a well-nourished female, in no acute distress. The vital signs are documented above. CARDIAC: There is a regular rate and rhythm.  VASCULAR: Bilateral edema with hyperpigmentation bilaterally PULMONARY: Non-labored respirations ABDOMEN: Soft and non-tender with normal pitched bowel sounds.  MUSCULOSKELETAL: There are no major deformities or cyanosis. NEUROLOGIC: No focal weakness or paresthesias are detected. SKIN: There are no ulcers or rashes noted. PSYCHIATRIC: The patient has a normal affect.  STUDIES:   I have reviewed her vascular studies with the following findings: Right:  - No evidence of deep vein thrombosis seen in the right lower extremity,  from the common femoral through the popliteal veins.  - No evidence of superficial venous thrombosis in the right lower  extremity.     - Venous reflux is noted in the right sapheno-femoral junction.  - Venous reflux is noted in the right greater saphenous vein in the calf.  - Venous reflux is noted in the right popliteal vein.  - Venous reflux is noted in the right proximal calf perforator     ABI (05-21-2020) ABI/TBIToday's ABIToday's TBIPrevious ABIPrevious TBI  +-------+-----------+-----------+------------+------------+  Right  1.12       0.65                                 +-------+-----------+-----------+------------+------------+  Left   0.93       0.58                                  +-------+-----------+-----------+------------+------------+  Waveforms were biphasic.  Left toe pressure is 89.  Right toe pressure is 100   MEDICAL ISSUES:   Weeping from both legs: The patient has a history of extensive DVT of the left leg.  Therefore I do not think she would be a candidate for venous intervention on the left.  She came back in today for venous evaluation of the right.  She really does not have axial reflux in the superficial venous system that would be appropriate for treatment.  She does have a proximal calf perforator however when I evaluated this with ultrasound, appeared to be too small for treatment, less than 3 mm.  Therefore, I told the patient that I would recommend continued compression therapy and once her drainage stops transition to 20-30 compression garments.  I will bring her back in 3 months for a wound check.    Charlena Cross, MD, FACS [facility] [phone] Pager [phone]

## 2020-12-17 NOTE — Progress Notes (Signed)
EVETTE, DICLEMENTE ([id number]) Visit Report for 12/17/2020 Arrival Information Details Patient Name: Date of Service: Barbara Lucero 12/17/2020 2:00 PM Medical Record Number: [id number] Patient Account Number: [id number] Date of Birth/Sex: Treating RN: Dec 09, 1940 (80 y.o. Barbara Lucero, [name] Primary Care Caio Devera: [name] Other Clinician: Referring Mikeal Winstanley: Treating Elery Cadenhead/Extender: Tomasita Crumble in Treatment: 16 Visit Information History Since Last Visit All ordered tests and consults were completed: Yes Patient Arrived: [name] Added or deleted any medications: No Arrival Time: 13:05 Any new allergies or adverse reactions: No Accompanied By: family member Had a fall or experienced change in No Transfer Assistance: Manual activities of daily living that may affect Patient Identification Verified: Yes risk of falls: Secondary Verification Process Completed: Yes Signs or symptoms of abuse/neglect since last visito No Patient Requires Transmission-Based Precautions: No Hospitalized since last visit: No Patient Has Alerts: No Implantable device outside of the clinic excluding No cellular tissue based products placed in the center since last visit: Has Dressing in Place as Prescribed: Yes Pain Present Now: Yes Notes Per patient went to [facility] today and per patient unable to correct any blood flow issues. Electronic Signature(s) Signed: 12/17/2020 4:57:17 PM By: Shawn Stall RN, BSN Entered By: Shawn Stall on 12/17/2020 13:09:10 -------------------------------------------------------------------------------- Clinic Level of Care Assessment Details Patient Name: Date of Service: Barbara Lucero 12/17/2020 2:00 PM Medical Record Number: [id number] Patient Account Number: [id number] Date of Birth/Sex: Treating RN: February 10, 1941 (80 y.o. Barbara Lucero, [name] Primary Care Zafirah Vanzee: [name] Other Clinician: Referring Atharv Barriere: Treating  Nazaiah Navarrete/Extender: Tomasita Crumble in Treatment: 16 Clinic Level of Care Assessment Items TOOL 4 Quantity Score X- 1 0 Use when only an EandM is performed on FOLLOW-UP visit ASSESSMENTS - Nursing Assessment / Reassessment X- 1 10 Reassessment of Co-morbidities (includes updates in patient status) X- 1 5 Reassessment of Adherence to Treatment Plan ASSESSMENTS - Wound and Skin A ssessment / Reassessment []  - 0 Simple Wound Assessment / Reassessment - one wound X- 3 5 Complex Wound Assessment / Reassessment - multiple wounds X- 1 10 Dermatologic / Skin Assessment (not related to wound area) ASSESSMENTS - Focused Assessment X- 2 5 Circumferential Edema Measurements - multi extremities X- 1 10 Nutritional Assessment / Counseling / Intervention []  - 0 Lower Extremity Assessment (monofilament, tuning fork, pulses) []  - 0 Peripheral Arterial Disease Assessment (using hand held doppler) ASSESSMENTS - Ostomy and/or Continence Assessment and Care []  - 0 Incontinence Assessment and Management []  - 0 Ostomy Care Assessment and Management (repouching, etc.) PROCESS - Coordination of Care []  - 0 Simple Patient / Family Education for ongoing care X- 1 20 Complex (extensive) Patient / Family Education for ongoing care X- 1 10 Staff obtains , Records, T Results / Process Orders est []  - 0 Staff telephones HHA, Nursing Homes / Clarify orders / etc []  - 0 Routine Transfer to another Facility (non-emergent condition) []  - 0 Routine Hospital Admission (non-emergent condition) []  - 0 New Admissions / / Ordering NPWT Apligraf, etc. , []  - 0 Emergency Hospital Admission (emergent condition) []  - 0 Simple Discharge Coordination X- 1 15 Complex (extensive) Discharge Coordination PROCESS - Special Needs []  - 0 Pediatric / Minor Patient Management []  - 0 Isolation Patient Management []  - 0 Hearing / Language / Visual special  needs []  - 0 Assessment of Community assistance (transportation, D/C planning, etc.) []  - 0 Additional assistance / Altered mentation []  - 0 Support Surface(s) Assessment (bed,  cushion, seat, etc.) INTERVENTIONS - Wound Cleansing / Measurement []  - 0 Simple Wound Cleansing - one wound X- 3 5 Complex Wound Cleansing - multiple wounds X- 1 5 Wound Imaging (photographs - any number of wounds) []  - 0 Wound Tracing (instead of photographs) []  - 0 Simple Wound Measurement - one wound X- 3 5 Complex Wound Measurement - multiple wounds INTERVENTIONS - Wound Dressings []  - 0 Small Wound Dressing one or multiple wounds []  - 0 Medium Wound Dressing one or multiple wounds X- 2 20 Large Wound Dressing one or multiple wounds X- 1 5 Application of Medications - topical []  - 0 Application of Medications - injection INTERVENTIONS - Miscellaneous []  - 0 External ear exam []  - 0 Specimen Collection (cultures, biopsies, blood, body fluids, etc.) []  - 0 Specimen(s) / Culture(s) sent or taken to Lab for analysis []  - 0 Patient Transfer (multiple staff / / Similar devices) []  - 0 Simple Staple / Suture removal (25 or less) []  - 0 Complex Staple / Suture removal (26 or more) []  - 0 Hypo / Hyperglycemic Management (close monitor of Blood Glucose) []  - 0 Ankle / Brachial Index (ABI) - do not check if billed separately X- 1 5 Vital Signs Has the patient been seen at the hospital within the last three years: Yes Total Score: 190 Level Of Care: New/Established - Level 5 Electronic Signature(s) Signed: 12/17/2020 4:57:17 PM By: RN, BSN Entered By: on 12/17/2020 14:20:51 -------------------------------------------------------------------------------- Encounter Discharge Information Details Patient Name: Date of Service: B. 12/17/2020 2:00 PM Medical Record Number: Patient Account Number: Date of Birth/Sex:  Treating RN: [birth year] (80 y.o. Primary Care Gina Costilla: Other Clinician: Referring Amanat Hackel: Treating Roseanne Juenger/Extender: in Treatment: 16 Encounter Discharge Information Items Discharge Condition: Stable Ambulatory Status: Walker Discharge Destination: Home Transportation: Private Auto Accompanied By: family member Schedule Follow-up Appointment: Yes Clinical Summary of Care: Electronic Signature(s) Signed: 12/17/2020 4:57:17 PM By: 12/19/2020 RN, BSN Entered By: Shawn Stall on 12/17/2020 14:21:38 -------------------------------------------------------------------------------- Lower Extremity Assessment Details Patient Name: Date of Service: 12/19/2020 B. 12/17/2020 2:00 PM Medical Record Number: 12/19/2020 Patient Account Number: [id number] Date of Birth/Sex: Treating RN: [birth year] (80 y.o. 72, Barbara Lucero Primary Care Bobie Caris: [name] Other Clinician: Referring Nichoals Heyde: Treating Sherleen Pangborn/Extender: Tomasita Crumble in Treatment: 16 Edema Assessment Assessed: 12/19/2020: Yes] Shawn Stall: Yes] Edema: [Left: Yes] [Right: Yes] Calf Left: Right: Point of Measurement: 29 cm From Medial Instep 36 cm 33 cm Ankle Left: Right: Point of Measurement: 10 cm From Medial Instep 21.5 cm 20.5 cm Knee To Floor Left: Right: From Medial Instep 42 cm 42 cm Vascular Assessment Pulses: Dorsalis Pedis Palpable: [Left:Yes] [Right:Yes] Electronic Signature(s) Signed: 12/17/2020 4:57:17 PM By: 12/19/2020 RN, BSN Entered By: Barbara Lucero on 12/17/2020 13:12:25 -------------------------------------------------------------------------------- Multi Wound Chart Details Patient Name: Date of Service: [id number] B. 12/17/2020 2:00 PM Medical Record Number: 08/27/1940 Patient Account Number: [id number] Date of Birth/Sex: Treating RN: 1941/02/12 (80 y.o. [name] Primary Care Nycholas Rayner: Tomasita Crumble Other Clinician: Referring Esequiel Kleinfelter: Treating Kevork Joyce/Extender: [name] in Treatment: 16 Vital Signs Height(in): 63 Pulse(bpm): 71 Weight(lbs): 182 Blood Pressure(mmHg): 155/60 Body Mass Index(BMI): 32 Temperature(F): 98.2 Respiratory Rate(breaths/min): 20 Photos: Left, Lateral Ankle Right, Lateral Ankle Right, Anterior Lower Leg Wound Location: Blister Gradually Appeared Gradually Appeared Wounding Event: Venous Leg Ulcer Venous Leg Ulcer Venous Leg  Ulcer Primary Etiology: N/A Lymphedema Lymphedema Secondary Etiology: Cataracts, Deep Vein Thrombosis, Cataracts, Deep Vein Thrombosis, Cataracts, Deep Vein Thrombosis, Comorbid History: Peripheral Venous Disease, Crohns, Peripheral Venous Disease, Crohns, Peripheral Venous Disease, Crohns, Osteoarthritis, Confinement Anxiety Osteoarthritis, Confinement Anxiety Osteoarthritis, Confinement Anxiety 11/26/2020 11/26/2020 12/17/2020 Date Acquired: 1 1 0 Weeks of Treatment: Open Healed - Epithelialized Open Wound Status: 2.5x1x0.1 0x0x0 0.5x0.3x0.1 Measurements L x W x D (cm) 1.963 0 0.118 A (cm) : rea 0.196 0 0.012 Volume (cm) : -1150.30% 100.00% 0.00% % Reduction in Area: -1125.00% 100.00% 0.00% % Reduction in Volume: Full Thickness Without Exposed Full Thickness Without Exposed Full Thickness Without Exposed Classification: Support Structures Support Structures Support Structures Large None Present Large Exudate Amount: Serous N/A Serous Exudate Type: amber N/A amber Exudate Color: Flat and Intact Indistinct, nonvisible Distinct, outline attached Wound Margin: Large (67-100%) None Present (0%) Large (67-100%) Granulation Amount: Pink N/A Pink, Pale Granulation Quality: N/A None Present (0%) None Present (0%) Necrotic Amount: Fat Layer (Subcutaneous Tissue): Yes Fat Layer (Subcutaneous Tissue): Yes Fat Layer (Subcutaneous Tissue): Yes Exposed Structures: Fascia: No Fascia:  No Fascia: No Tendon: No Tendon: No Tendon: No Muscle: No Muscle: No Muscle: No Joint: No Joint: No Joint: No Bone: No Bone: No Bone: No None Large (67-100%) None Epithelialization: Treatment Notes Wound #4 (Ankle) Wound Laterality: Left, Lateral Cleanser Peri-Wound Care Triamcinolone 15 (g) Discharge Instruction: Use triamcinolone 15 (g) as directed Sween Lotion (Moisturizing lotion) Discharge Instruction: Apply moisturizing lotion as directed Topical Primary Dressing KerraCel Ag Gelling Fiber Dressing, 2x2 in (silver alginate) Discharge Instruction: Apply silver alginate to wound bed as instructed Secondary Dressing Woven Gauze Sponge, Non-Sterile 4x4 in Discharge Instruction: Apply over primary dressing as directed. ABD Pad, 8x10 Discharge Instruction: Apply over primary dressing as directed. Secured With Compression Wrap Kerlix Roll 4.5x3.1 (in/yd) Discharge Instruction: Apply Kerlix and Coban compression as directed. Coban Self-Adherent Wrap 4x5 (in/yd) Discharge Instruction: Apply over Kerlix as directed. Compression Stockings Add-Ons Wound #7 (Lower Leg) Wound Laterality: Right, Anterior Cleanser Peri-Wound Care Triamcinolone 15 (g) Discharge Instruction: Use triamcinolone 15 (g) as directed Sween Lotion (Moisturizing lotion) Discharge Instruction: Apply moisturizing lotion as directed Topical Primary Dressing KerraCel Ag Gelling Fiber Dressing, 2x2 in (silver alginate) Discharge Instruction: Apply silver alginate to wound bed as instructed Secondary Dressing Woven Gauze Sponge, Non-Sterile 4x4 in Discharge Instruction: Apply over primary dressing as directed. ABD Pad, 8x10 Discharge Instruction: Apply over primary dressing as directed. Secured With Compression Wrap Kerlix Roll 4.5x3.1 (in/yd) Discharge Instruction: Apply Kerlix and Coban compression as directed. Coban Self-Adherent Wrap 4x5 (in/yd) Discharge Instruction: Apply over Kerlix as  directed. Compression Stockings Add-Ons Electronic Signature(s) Signed: 12/17/2020 3:04:07 PM By: Geralyn Corwin DO Signed: 12/17/2020 4:46:21 PM By: Bo Mcclintock By: Geralyn Corwin on 12/17/2020 14:43:30 -------------------------------------------------------------------------------- Multi-Disciplinary Care Plan Details Patient Name: Date of Service: Barbara Lucero B. 12/17/2020 2:00 PM Medical Record Number: [id number] Patient Account Number: [id number] Date of Birth/Sex: Treating RN: 1940-11-13 (80 y.o. Barbara Lucero Primary Care Worthy Boschert: [name] Other Clinician: Referring Biruk Troia: Treating Nathania Waldman/Extender: Tomasita Crumble in Treatment: 16 Multidisciplinary Care Plan reviewed with physician Active Inactive Venous Leg Ulcer Nursing Diagnoses: Potential for venous Insuffiency (use before diagnosis confirmed) Goals: Patient will maintain optimal edema control Date Initiated: 08/23/2020 Target Resolution Date: 02/07/2021 Goal Status: Active Interventions: Assess peripheral edema status every visit. Provide education on venous insufficiency Treatment Activities: Non-invasive vascular studies : 08/23/2020 T ordered outside of clinic : 08/23/2020 est Therapeutic compression applied : 08/23/2020  Notes: Wound/Skin Impairment Nursing Diagnoses: Knowledge deficit related to ulceration/compromised skin integrity Goals: Patient/caregiver will verbalize understanding of skin care regimen Date Initiated: 08/23/2020 Target Resolution Date: 02/07/2021 Goal Status: Active Ulcer/skin breakdown will have a volume reduction of 30% by week 4 Date Initiated: 10/01/2020 Date Inactivated: 12/17/2020 Target Resolution Date: 01/08/2021 Unmet Reason: see wound Goal Status: Unmet measurements. Interventions: Assess patient/caregiver ability to obtain necessary supplies Assess patient/caregiver ability to perform ulcer/skin care regimen upon admission and  as needed Provide education on ulcer and skin care Treatment Activities: Skin care regimen initiated : 08/23/2020 Topical wound management initiated : 08/23/2020 Notes: Electronic Signature(s) Signed: 12/17/2020 4:57:17 PM By: Shawn Stall RN, BSN Entered By: Shawn Stall on 12/17/2020 13:49:46 -------------------------------------------------------------------------------- Pain Assessment Details Patient Name: Date of Service: Barbara Lucero B. 12/17/2020 2:00 PM Medical Record Number: [id number] Patient Account Number: [id number] Date of Birth/Sex: Treating RN: 1940-07-29 (80 y.o. Barbara Lucero Primary Care Addysen Louth: [name] Other Clinician: Referring Murl Golladay: Treating Quintell Bonnin/Extender: Tomasita Crumble in Treatment: 16 Active Problems Location of Pain Severity and Description of Pain Patient Has Paino Yes Site Locations Pain Location: Generalized Pain, Pain in Ulcers Rate the pain. Current Pain Level: 7 Worst Pain Level: 10 Least Pain Level: 0 Tolerable Pain Level: 9 Pain Management and Medication Current Pain Management: Medication: Yes Cold Application: No Rest: Yes Massage: No Activity: No T.E.N.S.: No Heat Application: No Leg drop or elevation: No Is the Current Pain Management Adequate: Adequate How does your wound impact your activities of daily livingo Sleep: Yes Bathing: No Appetite: No Relationship With Others: No Bladder Continence: No Emotions: No Bowel Continence: No Work: No Toileting: No Drive: No Dressing: No Hobbies: No Psychologist, prison and probation services) Signed: 12/17/2020 4:57:17 PM By: Shawn Stall RN, BSN Entered By: Shawn Stall on 12/17/2020 13:09:42 -------------------------------------------------------------------------------- Patient/Caregiver Education Details Patient Name: Date of Service: Barbara Lucero 10/24/2022andnbsp2:00 PM Medical Record Number: [id number] Patient Account Number: [id number] Date of  Birth/Gender: Treating RN: Mar 21, 1940 (80 y.o. Barbara Lucero Primary Care Physician: [name] Other Clinician: Referring Physician: Treating Physician/Extender: Tomasita Crumble in Treatment: 16 Education Assessment Education Provided To: Patient Education Topics Provided Wound/Skin Impairment: Handouts: Skin Care Do's and Dont's Methods: Explain/Verbal Responses: Reinforcements needed Electronic Signature(s) Signed: 12/17/2020 4:57:17 PM By: Shawn Stall RN, BSN Entered By: Shawn Stall on 12/17/2020 13:50:09 -------------------------------------------------------------------------------- Wound Assessment Details Patient Name: Date of Service: Barbara Lucero B. 12/17/2020 2:00 PM Medical Record Number: [id number] Patient Account Number: [id number] Date of Birth/Sex: Treating RN: 1940-03-18 (80 y.o. Barbara Lucero, [name] Primary Care Dalia Jollie: [name] Other Clinician: Referring Amberly Livas: Treating Aniyiah Zell/Extender: Tomasita Crumble in Treatment: 16 Wound Status Wound Number: 4 Primary Venous Leg Ulcer Etiology: Wound Location: Left, Lateral Ankle Wound Open Wounding Event: Blister Status: Date Acquired: 11/26/2020 Comorbid Cataracts, Deep Vein Thrombosis, Peripheral Venous Disease, Weeks Of Treatment: 1 History: Crohns, Osteoarthritis, Confinement Anxiety Clustered Wound: No Photos Wound Measurements Length: (cm) 2.5 Width: (cm) 1 Depth: (cm) 0.1 Area: (cm) 1.963 Volume: (cm) 0.196 % Reduction in Area: -1150.3% % Reduction in Volume: -1125% Epithelialization: None Tunneling: No Undermining: No Wound Description Classification: Full Thickness Without Exposed Support Structures Wound Margin: Flat and Intact Exudate Amount: Large Exudate Type: Serous Exudate Color: amber Foul Odor After Cleansing: No Slough/Fibrino No Wound Bed Granulation Amount: Large (67-100%) Exposed Structure Granulation Quality: Pink Fascia  Exposed: No Fat Layer (Subcutaneous Tissue) Exposed: Yes Tendon Exposed: No Muscle Exposed: No Joint Exposed: No Bone  Exposed: No Treatment Notes Wound #4 (Ankle) Wound Laterality: Left, Lateral Cleanser Peri-Wound Care Triamcinolone 15 (g) Discharge Instruction: Use triamcinolone 15 (g) as directed Sween Lotion (Moisturizing lotion) Discharge Instruction: Apply moisturizing lotion as directed Topical Primary Dressing KerraCel Ag Gelling Fiber Dressing, 2x2 in (silver alginate) Discharge Instruction: Apply silver alginate to wound bed as instructed Secondary Dressing Woven Gauze Sponge, Non-Sterile 4x4 in Discharge Instruction: Apply over primary dressing as directed. ABD Pad, 8x10 Discharge Instruction: Apply over primary dressing as directed. Secured With Compression Wrap Kerlix Roll 4.5x3.1 (in/yd) Discharge Instruction: Apply Kerlix and Coban compression as directed. Coban Self-Adherent Wrap 4x5 (in/yd) Discharge Instruction: Apply over Kerlix as directed. Compression Stockings Add-Ons Electronic Signature(s) Signed: 12/17/2020 4:57:17 PM By: Shawn Stall RN, BSN Entered By: Shawn Stall on 12/17/2020 13:18:34 -------------------------------------------------------------------------------- Wound Assessment Details Patient Name: Date of Service: Barbara Lucero B. 12/17/2020 2:00 PM Medical Record Number: [id number] Patient Account Number: [id number] Date of Birth/Sex: Treating RN: 09/10/1940 (80 y.o. Barbara Lucero, [name] Primary Care Lynanne Delgreco: [name] Other Clinician: Referring Adilee Lemme: Treating Jacalyn Biggs/Extender: Tomasita Crumble in Treatment: 16 Wound Status Wound Number: 6 Primary Venous Leg Ulcer Etiology: Wound Location: Right, Lateral Ankle Secondary Lymphedema Wounding Event: Gradually Appeared Etiology: Etiology: Date Acquired: 11/26/2020 Wound Healed - Epithelialized Weeks Of Treatment: 1 Status: Clustered Wound: No Comorbid  Cataracts, Deep Vein Thrombosis, Peripheral Venous Disease, History: Crohns, Osteoarthritis, Confinement Anxiety Photos Wound Measurements Length: (cm) Width: (cm) Depth: (cm) Area: (cm) Volume: (cm) 0 % Reduction in Area: 100% 0 % Reduction in Volume: 100% 0 Epithelialization: Large (67-100%) 0 Tunneling: No 0 Undermining: No Wound Description Classification: Full Thickness Without Exposed Support Structures Wound Margin: Indistinct, nonvisible Exudate Amount: None Present Foul Odor After Cleansing: No Slough/Fibrino No Wound Bed Granulation Amount: None Present (0%) Exposed Structure Necrotic Amount: None Present (0%) Fascia Exposed: No Fat Layer (Subcutaneous Tissue) Exposed: Yes Tendon Exposed: No Muscle Exposed: No Joint Exposed: No Bone Exposed: No Electronic Signature(s) Signed: 12/17/2020 4:57:17 PM By: Shawn Stall RN, BSN Entered By: Shawn Stall on 12/17/2020 13:18:58 -------------------------------------------------------------------------------- Wound Assessment Details Patient Name: Date of Service: Barbara Lucero B. 12/17/2020 2:00 PM Medical Record Number: [id number] Patient Account Number: [id number] Date of Birth/Sex: Treating RN: 11/12/1940 (80 y.o. Barbara Lucero, [name] Primary Care Makaelah Cranfield: [name] Other Clinician: Referring Ralyn Stlaurent: Treating Jamilynn Whitacre/Extender: Tomasita Crumble in Treatment: 16 Wound Status Wound Number: 7 Primary Venous Leg Ulcer Etiology: Wound Location: Right, Anterior Lower Leg Secondary Lymphedema Wounding Event: Gradually Appeared Etiology: Date Acquired: 12/17/2020 Wound Open Weeks Of Treatment: 0 Status: Clustered Wound: No Comorbid Cataracts, Deep Vein Thrombosis, Peripheral Venous Disease, History: Crohns, Osteoarthritis, Confinement Anxiety Photos Wound Measurements Length: (cm) 0.5 Width: (cm) 0.3 Depth: (cm) 0.1 Area: (cm) 0.118 Volume: (cm) 0.012 % Reduction in Area: 0% %  Reduction in Volume: 0% Epithelialization: None Tunneling: No Undermining: No Wound Description Classification: Full Thickness Without Exposed Support Structu Wound Margin: Distinct, outline attached Exudate Amount: Large Exudate Type: Serous Exudate Color: amber res Foul Odor After Cleansing: No Slough/Fibrino No Wound Bed Granulation Amount: Large (67-100%) Exposed Structure Granulation Quality: Pink, Pale Fascia Exposed: No Necrotic Amount: None Present (0%) Fat Layer (Subcutaneous Tissue) Exposed: Yes Tendon Exposed: No Muscle Exposed: No Joint Exposed: No Bone Exposed: No Treatment Notes Wound #7 (Lower Leg) Wound Laterality: Right, Anterior Cleanser Peri-Wound Care Triamcinolone 15 (g) Discharge Instruction: Use triamcinolone 15 (g) as directed Sween Lotion (Moisturizing lotion) Discharge Instruction: Apply moisturizing lotion as directed Topical Primary Dressing  KerraCel Ag Gelling Fiber Dressing, 2x2 in (silver alginate) Discharge Instruction: Apply silver alginate to wound bed as instructed Secondary Dressing Woven Gauze Sponge, Non-Sterile 4x4 in Discharge Instruction: Apply over primary dressing as directed. ABD Pad, 8x10 Discharge Instruction: Apply over primary dressing as directed. Secured With Compression Wrap Kerlix Roll 4.5x3.1 (in/yd) Discharge Instruction: Apply Kerlix and Coban compression as directed. Coban Self-Adherent Wrap 4x5 (in/yd) Discharge Instruction: Apply over Kerlix as directed. Compression Stockings Add-Ons Electronic Signature(s) Signed: 12/17/2020 4:57:17 PM By: Shawn Stall RN, BSN Signed: 12/17/2020 4:57:17 PM By: Shawn Stall RN, BSN Entered By: Shawn Stall on 12/17/2020 13:19:18 -------------------------------------------------------------------------------- Vitals Details Patient Name: Date of Service: Barbara Lucero B. 12/17/2020 2:00 PM Medical Record Number: [id number] Patient Account Number: [id number] Date of  Birth/Sex: Treating RN: [birth year] (80 y.o. Barbara Lucero, Barbara Lucero Primary Care Tatiana Courter: [name] Other Clinician: Referring Amerika Nourse: Treating Pearl Bents/Extender: Tomasita Crumble in Treatment: 16 Vital Signs Time Taken: 13:05 Temperature (F): 98.2 Height (in): 63 Pulse (bpm): 71 Weight (lbs): 182 Respiratory Rate (breaths/min): 20 Body Mass Index (BMI): 32.2 Blood Pressure (mmHg): 155/60 Reference Range: 80 - 120 mg / dl Electronic Signature(s) Signed: 12/17/2020 4:57:17 PM By: Shawn Stall RN, BSN Entered By: Shawn Stall on 12/17/2020 13:09:25

## 2020-12-19 DIAGNOSIS — Z23 Encounter for immunization: Secondary | ICD-10-CM | POA: Diagnosis not present

## 2020-12-19 DIAGNOSIS — L89159 Pressure ulcer of sacral region, unspecified stage: Secondary | ICD-10-CM | POA: Diagnosis not present

## 2020-12-19 DIAGNOSIS — I872 Venous insufficiency (chronic) (peripheral): Secondary | ICD-10-CM | POA: Diagnosis not present

## 2020-12-25 ENCOUNTER — Encounter (HOSPITAL_BASED_OUTPATIENT_CLINIC_OR_DEPARTMENT_OTHER): Payer: Medicare Other | Attending: Internal Medicine | Admitting: Internal Medicine

## 2020-12-25 ENCOUNTER — Other Ambulatory Visit: Payer: Self-pay

## 2020-12-25 DIAGNOSIS — Z86718 Personal history of other venous thrombosis and embolism: Secondary | ICD-10-CM | POA: Insufficient documentation

## 2020-12-25 DIAGNOSIS — I87332 Chronic venous hypertension (idiopathic) with ulcer and inflammation of left lower extremity: Secondary | ICD-10-CM | POA: Diagnosis not present

## 2020-12-25 DIAGNOSIS — L97811 Non-pressure chronic ulcer of other part of right lower leg limited to breakdown of skin: Secondary | ICD-10-CM | POA: Insufficient documentation

## 2020-12-25 DIAGNOSIS — L97921 Non-pressure chronic ulcer of unspecified part of left lower leg limited to breakdown of skin: Secondary | ICD-10-CM | POA: Diagnosis not present

## 2020-12-25 DIAGNOSIS — L03116 Cellulitis of left lower limb: Secondary | ICD-10-CM | POA: Insufficient documentation

## 2020-12-25 DIAGNOSIS — L97819 Non-pressure chronic ulcer of other part of right lower leg with unspecified severity: Secondary | ICD-10-CM | POA: Diagnosis not present

## 2020-12-25 DIAGNOSIS — I739 Peripheral vascular disease, unspecified: Secondary | ICD-10-CM | POA: Insufficient documentation

## 2020-12-25 DIAGNOSIS — I87331 Chronic venous hypertension (idiopathic) with ulcer and inflammation of right lower extremity: Secondary | ICD-10-CM | POA: Insufficient documentation

## 2020-12-25 DIAGNOSIS — L97322 Non-pressure chronic ulcer of left ankle with fat layer exposed: Secondary | ICD-10-CM | POA: Diagnosis not present

## 2020-12-25 DIAGNOSIS — I890 Lymphedema, not elsewhere classified: Secondary | ICD-10-CM | POA: Diagnosis not present

## 2020-12-25 NOTE — Progress Notes (Signed)
KLANI, CARIDI ([id number]) Visit Report for 12/25/2020 Arrival Information Details Patient Name: Date of Service: Barbara Lucero 12/25/2020 1:00 PM Medical Record Number: [id number] Patient Account Number: [id number] Date of Birth/Sex: Treating RN: 1940-10-07 (80 y.o. [name] Primary Care Damaya Channing: [name] Other Clinician: Referring Travious Vanover: Treating Pace Lamadrid/Extender: [name] in Treatment: 17 Visit Information History Since Last Visit Added or deleted any medications: No Patient Arrived: [name] Any new allergies or adverse reactions: No Arrival Time: 13:06 Had a fall or experienced change in No Accompanied By: family member activities of daily living that may affect Transfer Assistance: None risk of falls: Patient Identification Verified: Yes Signs or symptoms of abuse/neglect since last visito No Secondary Verification Process Completed: Yes Hospitalized since last visit: No Patient Requires Transmission-Based Precautions: No Implantable device outside of the clinic excluding No Patient Has Alerts: No cellular tissue based products placed in the center since last visit: Has Dressing in Place as Prescribed: Yes Pain Present Now: No Electronic Signature(s) Signed: 12/25/2020 5:36:20 PM By: Zandra Abts RN, BSN Entered By: Zandra Abts on 12/25/2020 13:07:07 -------------------------------------------------------------------------------- Clinic Level of Care Assessment Details Patient Name: Date of Service: Barbara Sheets B. 12/25/2020 1:00 PM Medical Record Number: [id number] Patient Account Number: [id number] Date of Birth/Sex: Treating RN: 05/24/1940 (80 y.o. [name] Primary Care Javante Nilsson: [name] Other Clinician: Referring Rasheida Broden: Treating Kyland No/Extender: [name] in Treatment: 17 Clinic Level of Care Assessment Items TOOL 4 Quantity Score X- 1 0 Use when only an EandM is performed on  FOLLOW-UP visit ASSESSMENTS - Nursing Assessment / Reassessment X- 1 10 Reassessment of Co-morbidities (includes updates in patient status) X- 1 5 Reassessment of Adherence to Treatment Plan ASSESSMENTS - Wound and Skin A ssessment / Reassessment X - Simple Wound Assessment / Reassessment - one wound 1 5 []  - 0 Complex Wound Assessment / Reassessment - multiple wounds []  - 0 Dermatologic / Skin Assessment (not related to wound area) ASSESSMENTS - Focused Assessment []  - 0 Circumferential Edema Measurements - multi extremities []  - 0 Nutritional Assessment / Counseling / Intervention X- 1 5 Lower Extremity Assessment (monofilament, tuning fork, pulses) []  - 0 Peripheral Arterial Disease Assessment (using hand held doppler) ASSESSMENTS - Ostomy and/or Continence Assessment and Care []  - 0 Incontinence Assessment and Management []  - 0 Ostomy Care Assessment and Management (repouching, etc.) PROCESS - Coordination of Care X - Simple Patient / Family Education for ongoing care 1 15 []  - 0 Complex (extensive) Patient / Family Education for ongoing care X- 1 10 Staff obtains Consents, Records, T Results / Process Orders est []  - 0 Staff telephones HHA, Nursing Homes / Clarify orders / etc []  - 0 Routine Transfer to another Facility (non-emergent condition) []  - 0 Routine Hospital Admission (non-emergent condition) []  - 0 New Admissions / / Ordering NPWT Apligraf, etc. , []  - 0 Emergency Hospital Admission (emergent condition) X- 1 10 Simple Discharge Coordination []  - 0 Complex (extensive) Discharge Coordination PROCESS - Special Needs []  - 0 Pediatric / Minor Patient Management []  - 0 Isolation Patient Management []  - 0 Hearing / Language / Visual special needs []  - 0 Assessment of Community assistance (transportation, D/C planning, etc.) []  - 0 Additional assistance / Altered mentation []  - 0 Support Surface(s) Assessment (bed, cushion,  seat, etc.) INTERVENTIONS - Wound Cleansing / Measurement X - Simple Wound Cleansing - one wound 1 5 []  - 0 Complex Wound Cleansing -  multiple wounds X- 1 5 Wound Imaging (photographs - any number of wounds) []  - 0 Wound Tracing (instead of photographs) X- 1 5 Simple Wound Measurement - one wound []  - 0 Complex Wound Measurement - multiple wounds INTERVENTIONS - Wound Dressings []  - 0 Small Wound Dressing one or multiple wounds []  - 0 Medium Wound Dressing one or multiple wounds X- 1 20 Large Wound Dressing one or multiple wounds X- 1 5 Application of Medications - topical []  - 0 Application of Medications - injection INTERVENTIONS - Miscellaneous []  - 0 External ear exam []  - 0 Specimen Collection (cultures, biopsies, blood, body fluids, etc.) []  - 0 Specimen(s) / Culture(s) sent or taken to Lab for analysis []  - 0 Patient Transfer (multiple staff / / Similar devices) []  - 0 Simple Staple / Suture removal (25 or less) []  - 0 Complex Staple / Suture removal (26 or more) []  - 0 Hypo / Hyperglycemic Management (close monitor of Blood Glucose) []  - 0 Ankle / Brachial Index (ABI) - do not check if billed separately X- 1 5 Vital Signs Has the patient been seen at the hospital within the last three years: Yes Total Score: 105 Level Of Care: New/Established - Level 3 Electronic Signature(s) Signed: 12/25/2020 5:36:20 PM By: RN, BSN Entered By: on 12/25/2020 16:36:27 -------------------------------------------------------------------------------- Encounter Discharge Information Details Patient Name: Date of Service: B. 12/25/2020 1:00 PM Medical Record Number: Patient Account Number: Date of Birth/Sex: Treating RN: 1940/11/23 (80 y.o. Primary Care Lovelace Cerveny: Other Clinician: Referring Ronneisha Jett: Treating Toua Stites/Extender: in  Treatment: 17 Encounter Discharge Information Items Discharge Condition: Stable Ambulatory Status: Walker Discharge Destination: Home Transportation: Private Auto Accompanied By: family member Schedule Follow-up Appointment: Yes Clinical Summary of Care: Patient Declined Electronic Signature(s) Signed: 12/25/2020 5:36:20 PM By: Zandra Abts RN, BSN Entered By: 13/02/2020 on 12/25/2020 16:37:36 -------------------------------------------------------------------------------- Lower Extremity Assessment Details Patient Name: Date of Service: 13/02/2020 B. 12/25/2020 1:00 PM Medical Record Number: [id number] Patient Account Number: 08/27/1940 Date of Birth/Sex: Treating RN: 10-06-1940 (80 y.o. [name] Primary Care Zanae Kuehnle: [name] Other Clinician: Referring Izsak Meir: Treating Deivi Huckins/Extender: 18 in Treatment: 17 Edema Assessment Assessed: 13/02/2020: No] Zandra Abts: No] Edema: [Left: Yes] [Right: Yes] Calf Left: Right: Point of Measurement: 29 cm From Medial Instep 34 cm 31 cm Ankle Left: Right: Point of Measurement: 10 cm From Medial Instep 21 cm 20 cm Vascular Assessment Pulses: Dorsalis Pedis Palpable: [Left:Yes] [Right:Yes] Electronic Signature(s) Signed: 12/25/2020 5:36:20 PM By: 13/02/2020 RN, BSN Entered By: Barbara Sheets on 12/25/2020 13:17:24 -------------------------------------------------------------------------------- Multi-Disciplinary Care Plan Details Patient Name: Date of Service: [id number] B. 12/25/2020 1:00 PM Medical Record Number: 08/27/1940 Patient Account Number: [id number] Date of Birth/Sex: Treating RN: 07/31/1940 (80 y.o. [name] Primary Care Pawel Soules: [name] Other Clinician: Referring Jayshawn Colston: Treating Shogo Larkey/Extender: [name] in Treatment: 17 Multidisciplinary Care Plan reviewed with physician Active Inactive Venous Leg Ulcer Nursing  Diagnoses: Potential for venous Insuffiency (use before diagnosis confirmed) Goals: Patient will maintain optimal edema control Date Initiated: 08/23/2020 Target Resolution Date: 02/07/2021 Goal Status: Active Interventions: Assess peripheral edema status every visit. Provide education on venous insufficiency Treatment Activities: Non-invasive vascular studies : 08/23/2020 T ordered outside of clinic : 08/23/2020 est Therapeutic compression applied : 08/23/2020 Notes: Wound/Skin Impairment Nursing Diagnoses: Knowledge deficit related to ulceration/compromised skin integrity Goals:  Patient/caregiver will verbalize understanding of skin care regimen Date Initiated: 08/23/2020 Target Resolution Date: 02/07/2021 Goal Status: Active Ulcer/skin breakdown will have a volume reduction of 30% by week 4 Date Initiated: 10/01/2020 Date Inactivated: 12/17/2020 Target Resolution Date: 01/08/2021 Unmet Reason: see wound Goal Status: Unmet measurements. Interventions: Assess patient/caregiver ability to obtain necessary supplies Assess patient/caregiver ability to perform ulcer/skin care regimen upon admission and as needed Provide education on ulcer and skin care Treatment Activities: Skin care regimen initiated : 08/23/2020 Topical wound management initiated : 08/23/2020 Notes: Electronic Signature(s) Signed: 12/25/2020 5:36:20 PM By: Zandra Abts RN, BSN Entered By: Zandra Abts on 12/25/2020 16:34:42 -------------------------------------------------------------------------------- Pain Assessment Details Patient Name: Date of Service: Barbara Sheets B. 12/25/2020 1:00 PM Medical Record Number: [id number] Patient Account Number: [id number] Date of Birth/Sex: Treating RN: 12-24-1940 (80 y.o. [name] Primary Care Irving Bloor: [name] Other Clinician: Referring Sharmeka Palmisano: Treating Timber Lucarelli/Extender: [name] in Treatment: 17 Active Problems Location of  Pain Severity and Description of Pain Patient Has Paino No Site Locations Pain Management and Medication Current Pain Management: Electronic Signature(s) Signed: 12/25/2020 5:36:20 PM By: Zandra Abts RN, BSN Entered By: Zandra Abts on 12/25/2020 13:07:31 -------------------------------------------------------------------------------- Patient/Caregiver Education Details Patient Name: Date of Service: Barbara Lucero 11/1/2022andnbsp1:00 PM Medical Record Number: [id number] Patient Account Number: [id number] Date of Birth/Gender: Treating RN: 14-Jun-1940 (80 y.o. [name] Primary Care Physician: [name] Other Clinician: Referring Physician: Treating Physician/Extender: [name] in Treatment: 17 Education Assessment Education Provided To: Patient Education Topics Provided Wound/Skin Impairment: Methods: Explain/Verbal Responses: State content correctly [employer]) Signed: 12/25/2020 5:36:20 PM By: Zandra Abts RN, BSN Entered By: Zandra Abts on 12/25/2020 16:34:56 -------------------------------------------------------------------------------- Wound Assessment Details Patient Name: Date of Service: Barbara Sheets B. 12/25/2020 1:00 PM Medical Record Number: [id number] Patient Account Number: [id number] Date of Birth/Sex: Treating RN: 1940/10/27 (80 y.o. [name] Primary Care Ashyla Luth: [name] Other Clinician: Referring Jubal Rademaker: Treating Talmage Teaster/Extender: [name] in Treatment: 17 Wound Status Wound Number: 4 Primary Venous Leg Ulcer Etiology: Wound Location: Left, Lateral Ankle Wound Open Wounding Event: Blister Status: Date Acquired: 11/26/2020 Comorbid Cataracts, Deep Vein Thrombosis, Peripheral Venous Disease, Weeks Of Treatment: 3 History: Crohns, Osteoarthritis, Confinement Anxiety Clustered Wound: No Photos Wound Measurements Length: (cm) 2.5 Width: (cm) 3 Depth:  (cm) 0.1 Area: (cm) 5.89 Volume: (cm) 0.589 % Reduction in Area: -3651.6% % Reduction in Volume: -3581.2% Epithelialization: None Tunneling: No Undermining: No Wound Description Classification: Full Thickness Without Exposed Support Structures Wound Margin: Flat and Intact Exudate Amount: Large Exudate Type: Serous Exudate Color: amber Foul Odor After Cleansing: No Slough/Fibrino No Wound Bed Granulation Amount: Large (67-100%) Exposed Structure Granulation Quality: Pink Fascia Exposed: No Fat Layer (Subcutaneous Tissue) Exposed: Yes Tendon Exposed: No Muscle Exposed: No Joint Exposed: No Bone Exposed: No Treatment Notes Wound #4 (Ankle) Wound Laterality: Left, Lateral Cleanser Soap and Water Discharge Instruction: May shower and wash wound with dial antibacterial soap and water prior to dressing change. Wound Cleanser Discharge Instruction: Cleanse the wound with wound cleanser prior to applying a clean dressing using gauze sponges, not tissue or cotton balls. Peri-Wound Care Triamcinolone 15 (g) Discharge Instruction: Use triamcinolone 15 (g) as directed Sween Lotion (Moisturizing lotion) Discharge Instruction: Apply moisturizing lotion as directed Topical Primary Dressing PolyMem Silver Non-Adhesive Dressing, 4.25x4.25 in Discharge Instruction: Apply to wound bed as instructed Secondary Dressing Woven Gauze Sponge, Non-Sterile 4x4 in Discharge Instruction: Apply over primary dressing  as directed. ABD Pad, 8x10 Discharge Instruction: Apply over primary dressing as directed. Secured With Compression Wrap Kerlix Roll 4.5x3.1 (in/yd) Discharge Instruction: Apply Kerlix and Coban compression as directed. Coban Self-Adherent Wrap 4x5 (in/yd) Discharge Instruction: Apply over Kerlix as directed. Compression Stockings Add-Ons Electronic Signature(s) Signed: 12/25/2020 5:36:20 PM By: Zandra Abts RN, BSN Entered By: Zandra Abts on 12/25/2020  13:20:04 -------------------------------------------------------------------------------- Wound Assessment Details Patient Name: Date of Service: Barbara Sheets B. 12/25/2020 1:00 PM Medical Record Number: [id number] Patient Account Number: [id number] Date of Birth/Sex: Treating RN: 01-Nov-1940 (80 y.o. [name] Primary Care Matheus Spiker: [name] Other Clinician: Referring Herby Amick: Treating Forest Redwine/Extender: [name] in Treatment: 17 Wound Status Wound Number: 7 Primary Venous Leg Ulcer Etiology: Wound Location: Right, Anterior Lower Leg Secondary Lymphedema Wounding Event: Gradually Appeared Etiology: Date Acquired: 12/17/2020 Wound Open Weeks Of Treatment: 1 Status: Clustered Wound: No Comorbid Cataracts, Deep Vein Thrombosis, Peripheral Venous Disease, History: Crohns, Osteoarthritis, Confinement Anxiety Photos Wound Measurements Length: (cm) Width: (cm) Depth: (cm) Area: (cm) Volume: (cm) 0 % Reduction in Area: 100% 0 % Reduction in Volume: 100% 0 Epithelialization: Large (67-100%) 0 Tunneling: No 0 Undermining: No Wound Description Classification: Full Thickness Without Exposed Support Structures Wound Margin: Distinct, outline attached Exudate Amount: None Present Foul Odor After Cleansing: No Slough/Fibrino No Wound Bed Granulation Amount: None Present (0%) Exposed Structure Necrotic Amount: None Present (0%) Fascia Exposed: No Fat Layer (Subcutaneous Tissue) Exposed: No Tendon Exposed: No Muscle Exposed: No Joint Exposed: No Bone Exposed: No Electronic Signature(s) Signed: 12/25/2020 5:36:20 PM By: Zandra Abts RN, BSN Entered By: Zandra Abts on 12/25/2020 13:18:52 -------------------------------------------------------------------------------- Vitals Details Patient Name: Date of Service: Barbara Sheets B. 12/25/2020 1:00 PM Medical Record Number: [id number] Patient Account Number: [id number] Date of  Birth/Sex: Treating RN: 13-Apr-1940 (80 y.o. [name] Primary Care Atreus Hasz: [name] Other Clinician: Referring Xanthe Couillard: Treating Dmonte Maher/Extender: [name] in Treatment: 17 Vital Signs Time Taken: 13:06 Temperature (F): 98.5 Height (in): 63 Pulse (bpm): 74 Weight (lbs): 182 Respiratory Rate (breaths/min): 16 Body Mass Index (BMI): 32.2 Blood Pressure (mmHg): 136/68 Reference Range: 80 - 120 mg / dl Electronic Signature(s) Signed: 12/25/2020 5:36:20 PM By: Zandra Abts RN, BSN Entered By: Zandra Abts on 12/25/2020 13:07:25

## 2020-12-25 NOTE — Progress Notes (Addendum)
AMETHYST, RUNSER ([id number]) Visit Report for 12/25/2020 HPI Details Patient Name: Date of Service: Barbara Lucero 12/25/2020 1:00 PM Medical Record Number: [id number] Patient Account Number: [id number] Date of Birth/Sex: Treating RN: Feb 05, 1941 (80 y.o. Barbara Lucero, [name] Primary Care Provider: Allyn Kenner Other Clinician: Referring Provider: Treating Provider/Extender: Adolm Joseph in Treatment: 17 History of Present Illness HPI Description: ADMISSION 08/23/2020 This is a 80 year old woman who arrives accompanied by her sister-in-law. 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 She has been dealing with chronic edema and discoloration of her legs for several years per her sister-in-law. I note that she saw Dr. Trula Slade on 05/21/2020 and he noted an extensive left leg DVT in 2019 although she is no longer on oral anticoagulants. He felt she had chronic venous insufficiency with skin changes related to this. He ordered her 20/30 stockings which were zippered stockings but I do not get the sense that the patient was all that compliant. In any case she started to develop weeping edema fluid recently coming out of the right lateral ankle serous drainage. She said it was pouring out last week. She saw her primary care provider on 08/08/2020 and was put on clindamycin which she is just finishing. She says the erythema feels better. Past medical history, aortic stenosis, chronic venous insufficiency, peripheral vascular disease, Crohn's disease which has been really recently quiescent, left hip fracture, DVT of the left leg in 2019. Notable that she has a very disabled son at home who she is the primary caregiver for Dr. Donzetta Matters quoted an ABI of 1.12 on the right with a TBI of 0.65 on the left and 0.93 and a TBI of 0.58. In our clinic today the ABI was 0.83 on the right and 0.81 on the left 7/7 the patient's wound on the right lateral ankle is healed. Her edema control is much  better. It turns out she does not have stockings but came in with Tubigrip on the left leg Readmission: 10/01/2020 on evaluation today patient appears to be doing decently well in regard to her leg. Unfortunately she does have an opening which happened as a result of her not wearing the compression socks she had something on the leg which was okay not necessarily the best but unfortunately did not include anything for the foot which is the main issue that we are finding here. With that being said I discussed with the patient that she really needs to have something for both and I think ordering her juxta lite compression wrap would be the ideal thing to do. 10/09/2020; patient I last saw in early July. At that point she had an area on the right lateral ankle which closed over. I am not really sure what happened however she is readmitted to the clinic last week. She had an area on her right lateral ankle this is closed however she has another area on the right lateral foot also quite a bit of discomfort on the right anterior foot. She has nothing open on the left leg. She comes in today with a single juxta lite stocking. She has a mid calf compression stocking she bought off [place] on the left 10/16/2020; everything that we are concerned about on the right leg is healed including the right lateral ankle and right dorsal foot. She has another juxta light for the right leg, she had 1 on the left. The venous reflux studies I ordered last week are on Thursday. I will  see her back 1 more time to review these. She had already been seen by Dr. Myra Gianotti 9/6; back in to discuss her reflux studies. This showed reflux in the greater saphenous vein on the right. I am going to send her back for Dr. Myra Gianotti to review these. She is wearing her juxta lite stockings but only had 20/30 mmHg compression. She became concerned this morning with erythema on the right leg. She says she has an appoint with Dr. Myra Gianotti in about 2  weeks 12/04/2020; patient presents after being discharged 1 month ago from our clinic. She states that she has been wearing her compression stockings intermittently. She has open wounds to her lower extremities bilaterally limited to skin breakdown. She has increased redness to her right foot and increased weeping to the right ankle area. She has increased pain to the right lower extremity as well. She has not followed up with vein and vascular since discharge from our clinic last month as recommended. 10/18; I see the patient was readmitted last week for again small weeping areas on the right lateral and a larger superficial weeping area on the left lateral lower legs she has an appointment with Dr. Myra Gianotti to review the reflux studies to see if anything could be ablated from a superficial vein point of view. We are using 3 layer compression on 10/24; patient presents for follow-up. She states she saw vein and vascular today. She reports that they recommended compression stocking. She states that the 3 layer compression was uncomfortable and would like to go down to the Kerlix/Coban. She reports increased redness, Warmth and pain to the left lower extrem 11/1; the area on the right anterior lower leg is closed. She complains of a painful area on the left lateral lower leg just above the ankle. Once again there is erythema here. I put her on Augmentin last week empirically this is not had any effect. Electronic Signature(s) Signed: 12/25/2020 4:29:54 PM By: Baltazar Najjar MD Entered By: Baltazar Najjar on 12/25/2020 13:56:16 -------------------------------------------------------------------------------- Physical Exam Details Patient Name: Date of Service: Barbara Sheets B. 12/25/2020 1:00 PM Medical Record Number: [id number] Patient Account Number: [id number] Date of Birth/Sex: Treating RN: 01/07/1941 (80 y.o. Barbara Lucero, [name] Primary Care Provider: [name] Other Clinician: Referring  Provider: Treating Provider/Extender: [name] in Treatment: 17 Constitutional Sitting or standing Blood Pressure is within target range for patient.. Pulse regular and within target range for patient.Marland Kitchen Respirations regular, non-labored and within target range.Marland Kitchen Appears in no distress. Notes Wound exam; left lateral ankle right anterior shin. The right anterior shin is closed. The area on the left lateral ankle again is erythematous very tender. Not much change from last week however.I have marked the area Electronic Signature(s) Signed: 12/25/2020 4:29:54 PM By: Baltazar Najjar MD Entered By: Baltazar Najjar on 12/25/2020 13:59:12 -------------------------------------------------------------------------------- Physician Orders Details Patient Name: Date of Service: Barbara Sheets B. 12/25/2020 1:00 PM Medical Record Number: [id number] Patient Account Number: [id number] Date of Birth/Sex: Treating RN: 06-14-1940 (80 y.o. Barbara Lucero Primary Care Provider: [name] Other Clinician: Referring Provider: Treating Provider/Extender: [name] in Treatment: 17 Verbal / Phone Orders: No Diagnosis Coding ICD-10 Coding Code Description I87.331 Chronic venous hypertension (idiopathic) with ulcer and inflammation of right lower extremity L97.811 Non-pressure chronic ulcer of other part of right lower leg limited to breakdown of skin I87.332 Chronic venous hypertension (idiopathic) with ulcer and inflammation of left lower extremity L97.921 Non-pressure chronic ulcer of  unspecified part of left lower leg limited to breakdown of skin I89.0 Lymphedema, not elsewhere classified L03.115 Cellulitis of right lower limb Follow-up Appointments ppointment in 1 week. - Dr. Dellia Nims Return A Bathing/ Shower/ Hygiene May shower with protection but do not get wound dressing(s) wet. Edema Control - Lymphedema / SCD / Other Bilateral Lower  Extremities Elevate legs to the level of the heart or above for 30 minutes daily and/or when sitting, a frequency of: - throughout the day Avoid standing for long periods of time. Exercise regularly Compression stocking or Garment 20-30 mm/Hg pressure to: - right leg daily Wound Treatment Wound #4 - Ankle Wound Laterality: Left, Lateral Cleanser: Soap and Water 1 x Per Week/30 Days Discharge Instructions: May shower and wash wound with dial antibacterial soap and water prior to dressing change. Cleanser: Wound Cleanser 1 x Per Week/30 Days Discharge Instructions: Cleanse the wound with wound cleanser prior to applying a clean dressing using gauze sponges, not tissue or cotton balls. Peri-Wound Care: Triamcinolone 15 (g) 1 x Per Week/30 Days Discharge Instructions: Use triamcinolone 15 (g) as directed Peri-Wound Care: Sween Lotion (Moisturizing lotion) 1 x Per Week/30 Days Discharge Instructions: Apply moisturizing lotion as directed Prim Dressing: PolyMem Silver Non-Adhesive Dressing, 4.25x4.25 in 1 x Per Week/30 Days ary Discharge Instructions: Apply to wound bed as instructed Secondary Dressing: Woven Gauze Sponge, Non-Sterile 4x4 in 1 x Per Week/30 Days Discharge Instructions: Apply over primary dressing as directed. Secondary Dressing: ABD Pad, 8x10 1 x Per Week/30 Days Discharge Instructions: Apply over primary dressing as directed. Compression Wrap: Kerlix Roll 4.5x3.1 (in/yd) 1 x Per Week/30 Days Discharge Instructions: Apply Kerlix and Coban compression as directed. Compression Wrap: Coban Self-Adherent Wrap 4x5 (in/yd) 1 x Per Week/30 Days Discharge Instructions: Apply over Kerlix as directed. Patient Medications llergies: coconut, Latex, Natural Rubber A Notifications Medication Indication Start End cellulitis left leg 12/25/2020 doxycycline monohydrate DOSE oral 100 mg capsule - 1 capsule oral bid for 7 days Electronic Signature(s) Signed: 12/25/2020 2:04:50 PM By: Linton Ham MD Entered By: Linton Ham on 12/25/2020 14:04:47 -------------------------------------------------------------------------------- Problem List Details Patient Name: Date of Service: Barbara Cage B. 12/25/2020 1:00 PM Medical Record Number: [id number] Patient Account Number: [id number] Date of Birth/Sex: Treating RN: 29-Mar-1940 (80 y.o. [name] Primary Care Provider: Allyn Kenner Other Clinician: Referring Provider: Treating Provider/Extender: Adolm Joseph in Treatment: 17 Active Problems ICD-10 Encounter Code Description Active Date MDM Diagnosis I87.331 Chronic venous hypertension (idiopathic) with ulcer and inflammation of right 08/23/2020 No Yes lower extremity I87.332 Chronic venous hypertension (idiopathic) with ulcer and inflammation of left 12/04/2020 No Yes lower extremity L03.116 Cellulitis of left lower limb 12/25/2020 No Yes L97.921 Non-pressure chronic ulcer of unspecified part of left lower leg limited to 12/04/2020 No Yes breakdown of skin I89.0 Lymphedema, not elsewhere classified 08/23/2020 No Yes Inactive Problems ICD-10 Code Description Active Date Inactive Date L97.811 Non-pressure chronic ulcer of other part of right lower leg limited to breakdown of skin 08/23/2020 08/23/2020 L03.115 Cellulitis of right lower limb 10/09/2020 10/09/2020 Resolved Problems Electronic Signature(s) Signed: 12/25/2020 4:29:54 PM By: Linton Ham MD Entered By: Linton Ham on 12/25/2020 13:55:09 -------------------------------------------------------------------------------- Progress Note Details Patient Name: Date of Service: Barbara Cage B. 12/25/2020 1:00 PM Medical Record Number: [id number] Patient Account Number: [id number] Date of Birth/Sex: Treating RN: 1941/01/15 (80 y.o. Barbara Lucero, [name] Primary Care Provider: Allyn Kenner Other Clinician: Referring Provider: Treating Provider/Extender: Adolm Joseph in  Treatment: 17 Subjective  History of Present Illness (HPI) ADMISSION 08/23/2020 This is a 80 year old woman who arrives accompanied by her sister-in-law. 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 She has been dealing with chronic edema and discoloration of her legs for several years per her sister-in-law. I note that she saw Dr. Myra Gianotti on 05/21/2020 and he noted an extensive left leg DVT in 2019 although she is no longer on oral anticoagulants. He felt she had chronic venous insufficiency with skin changes related to this. He ordered her 20/30 stockings which were zippered stockings but I do not get the sense that the patient was all that compliant. In any case she started to develop weeping edema fluid recently coming out of the right lateral ankle serous drainage. She said it was pouring out last week. She saw her primary care provider on 08/08/2020 and was put on clindamycin which she is just finishing. She says the erythema feels better. Past medical history, aortic stenosis, chronic venous insufficiency, peripheral vascular disease, Crohn's disease which has been really recently quiescent, left hip fracture, DVT of the left leg in 2019. Notable that she has a very disabled son at home who she is the primary caregiver for Dr. Randie Heinz quoted an ABI of 1.12 on the right with a TBI of 0.65 on the left and 0.93 and a TBI of 0.58. In our clinic today the ABI was 0.83 on the right and 0.81 on the left 7/7 the patient's wound on the right lateral ankle is healed. Her edema control is much better. It turns out she does not have stockings but came in with Tubigrip on the left leg Readmission: 10/01/2020 on evaluation today patient appears to be doing decently well in regard to her leg. Unfortunately she does have an opening which happened as a result of her not wearing the compression socks she had something on the leg which was okay not necessarily the best but unfortunately did not include anything for  the foot which is the main issue that we are finding here. With that being said I discussed with the patient that she really needs to have something for both and I think ordering her juxta lite compression wrap would be the ideal thing to do. 10/09/2020; patient I last saw in early July. At that point she had an area on the right lateral ankle which closed over. I am not really sure what happened however she is readmitted to the clinic last week. She had an area on her right lateral ankle this is closed however she has another area on the right lateral foot also quite a bit of discomfort on the right anterior foot. She has nothing open on the left leg. She comes in today with a single juxta lite stocking. She has a mid calf compression stocking she bought off [employer] on the left 10/16/2020; everything that we are concerned about on the right leg is healed including the right lateral ankle and right dorsal foot. She has another juxta light for the right leg, she had 1 on the left. The venous reflux studies I ordered last week are on Thursday. I will see her back 1 more time to review these. She had already been seen by Dr. Myra Gianotti 9/6; back in to discuss her reflux studies. This showed reflux in the greater saphenous vein on the right. I am going to send her back for Dr. Myra Gianotti to review these. She is wearing her juxta lite stockings but only had 20/30 mmHg compression.  She became concerned this morning with erythema on the right leg. She says she has an appoint with Dr. Trula Slade in about 2 weeks 12/04/2020; patient presents after being discharged 1 month ago from our clinic. She states that she has been wearing her compression stockings intermittently. She has open wounds to her lower extremities bilaterally limited to skin breakdown. She has increased redness to her right foot and increased weeping to the right ankle area. She has increased pain to the right lower extremity as well. She has not followed  up with vein and vascular since discharge from our clinic last month as recommended. 10/18; I see the patient was readmitted last week for again small weeping areas on the right lateral and a larger superficial weeping area on the left lateral lower legs she has an appointment with Dr. Trula Slade to review the reflux studies to see if anything could be ablated from a superficial vein point of view. We are using 3 layer compression on 10/24; patient presents for follow-up. She states she saw vein and vascular today. She reports that they recommended compression stocking. She states that the 3 layer compression was uncomfortable and would like to go down to the Kerlix/Coban. She reports increased redness, Warmth and pain to the left lower extrem 11/1; the area on the right anterior lower leg is closed. She complains of a painful area on the left lateral lower leg just above the ankle. Once again there is erythema here. I put her on Augmentin last week empirically this is not had any effect. Objective Constitutional Sitting or standing Blood Pressure is within target range for patient.. Pulse regular and within target range for patient.Marland Kitchen Respirations regular, non-labored and within target range.Marland Kitchen Appears in no distress. Vitals Time Taken: 1:06 PM, Height: 63 in, Weight: 182 lbs, BMI: 32.2, Temperature: 98.5 F, Pulse: 74 bpm, Respiratory Rate: 16 breaths/min, Blood Pressure: 136/68 mmHg. General Notes: Wound exam; left lateral ankle right anterior shin. The right anterior shin is closed. The area on the left lateral ankle again is erythematous very tender. Not much change from last week however.I have marked the area Integumentary (Hair, Skin) Wound #4 status is Open. Original cause of wound was Blister. The date acquired was: 11/26/2020. The wound has been in treatment 3 weeks. The wound is located on the Left,Lateral Ankle. The wound measures 2.5cm length x 3cm width x 0.1cm depth; 5.89cm^2 area and  0.589cm^3 volume. There is Fat Layer (Subcutaneous Tissue) exposed. There is no tunneling or undermining noted. There is a large amount of serous drainage noted. The wound margin is flat and intact. There is large (67-100%) pink granulation within the wound bed. Wound #7 status is Open. Original cause of wound was Gradually Appeared. The date acquired was: 12/17/2020. The wound has been in treatment 1 weeks. The wound is located on the Right,Anterior Lower Leg. The wound measures 0cm length x 0cm width x 0cm depth; 0cm^2 area and 0cm^3 volume. There is no tunneling or undermining noted. There is a none present amount of drainage noted. The wound margin is distinct with the outline attached to the wound base. There is no granulation within the wound bed. There is no necrotic tissue within the wound bed. 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 - Dr. Dellia Nims Bathing/ Shower/ Hygiene: May shower with protection but do not get wound dressing(s) wet. Edema Control - Lymphedema / SCD / Other: Elevate legs to the level of the heart or above for 30 minutes daily and/or when sitting, a frequency of: - throughout the day Avoid standing for long periods of time. Exercise regularly Compression stocking or Garment 20-30 mm/Hg pressure to: - right leg daily WOUND #4: - Ankle Wound Laterality: Left, Lateral Cleanser: Soap and Water 1 x Per Week/30 Days Discharge Instructions: May shower and wash wound with dial antibacterial soap and water prior to dressing change. Cleanser: Wound Cleanser 1 x Per Week/30  Days Discharge Instructions: Cleanse the wound with wound cleanser prior to applying a clean dressing using gauze sponges, not tissue or cotton balls. Peri-Wound Care: Triamcinolone 15 (g) 1 x Per Week/30 Days Discharge Instructions: Use triamcinolone 15 (g) as directed Peri-Wound Care: Sween Lotion (Moisturizing lotion) 1 x Per Week/30 Days Discharge Instructions: Apply moisturizing lotion as directed Prim Dressing: PolyMem Silver Non-Adhesive Dressing, 4.25x4.25 in 1 x Per Week/30 Days ary Discharge Instructions: Apply to wound bed as instructed Secondary Dressing: Woven Gauze Sponge, Non-Sterile 4x4 in 1 x Per Week/30 Days Discharge Instructions: Apply over primary dressing as directed. Secondary Dressing: ABD Pad, 8x10 1 x Per Week/30 Days Discharge Instructions: Apply over primary dressing as directed. Com pression Wrap: Kerlix Roll 4.5x3.1 (in/yd) 1 x Per Week/30 Days Discharge Instructions: Apply Kerlix and Coban compression as directed. Com pression Wrap: Coban Self-Adherent Wrap 4x5 (in/yd) 1 x Per Week/30 Days Discharge Instructions: Apply over Kerlix as directed. 1. #1 very irritated area all around the left lower leg wound laterally. I wondered whether this is cellulitis resistant to the Augmentin, uncontrolled edema with stasis dermatitis or even perhaps a contact reaction to silver 2. Doxycycline 100 twice daily x1 week Electronic Signature(s) Signed: 12/25/2020 4:29:54 PM By: Linton Ham MD Entered By: Linton Ham on 12/25/2020 14:00:38 -------------------------------------------------------------------------------- SuperBill Details Patient Name: Date of Service: Barbara Cage B. 12/25/2020 Medical Record Number: [id number] Patient Account Number: [id number] Date of Birth/Sex: Treating RN: Sep 04, 1940 (80 y.o. 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 limited to breakdown of skin I89.0 Lymphedema, not elsewhere classified Facility Procedures CPT4 Code: [id number] Description: 99213 - WOUND CARE VISIT-LEV 3 EST PT Modifier: Quantity: 1 Physician Procedures : CPT4 Code Description Modifier [id number] - WC PHYS LEVEL 3 - EST PT ICD-10 Diagnosis Description I87.332 Chronic venous hypertension (idiopathic) with ulcer and inflammation of left lower extremity I89.0 Lymphedema, not elsewhere classified L97.921  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

## 2021-01-01 ENCOUNTER — Encounter (HOSPITAL_BASED_OUTPATIENT_CLINIC_OR_DEPARTMENT_OTHER): Payer: Medicare Other | Admitting: Internal Medicine

## 2021-01-01 ENCOUNTER — Other Ambulatory Visit: Payer: Self-pay

## 2021-01-01 DIAGNOSIS — L97921 Non-pressure chronic ulcer of unspecified part of left lower leg limited to breakdown of skin: Secondary | ICD-10-CM | POA: Diagnosis not present

## 2021-01-01 DIAGNOSIS — I87332 Chronic venous hypertension (idiopathic) with ulcer and inflammation of left lower extremity: Secondary | ICD-10-CM | POA: Diagnosis not present

## 2021-01-01 DIAGNOSIS — I890 Lymphedema, not elsewhere classified: Secondary | ICD-10-CM | POA: Diagnosis not present

## 2021-01-01 DIAGNOSIS — Z86718 Personal history of other venous thrombosis and embolism: Secondary | ICD-10-CM | POA: Diagnosis not present

## 2021-01-01 DIAGNOSIS — L97322 Non-pressure chronic ulcer of left ankle with fat layer exposed: Secondary | ICD-10-CM | POA: Diagnosis not present

## 2021-01-01 DIAGNOSIS — I739 Peripheral vascular disease, unspecified: Secondary | ICD-10-CM | POA: Diagnosis not present

## 2021-01-01 DIAGNOSIS — I87331 Chronic venous hypertension (idiopathic) with ulcer and inflammation of right lower extremity: Secondary | ICD-10-CM | POA: Diagnosis not present

## 2021-01-01 DIAGNOSIS — L97811 Non-pressure chronic ulcer of other part of right lower leg limited to breakdown of skin: Secondary | ICD-10-CM | POA: Diagnosis not present

## 2021-01-01 DIAGNOSIS — L03116 Cellulitis of left lower limb: Secondary | ICD-10-CM | POA: Diagnosis not present

## 2021-01-01 NOTE — Progress Notes (Signed)
BAELEE, WOEHRLE ([id number]) Visit Report for 01/01/2021 HPI Details Patient Name: Date of Service: Barbara Lucero 01/01/2021 2:30 PM Medical Record Number: [id number] Patient Account Number: [id number] Date of Birth/Sex: Treating RN: December 22, 1940 (80 y.o. [name], [name] Primary Care Provider: Allyn Kenner Other Clinician: Referring Provider: Treating Provider/Extender: Adolm Joseph in Treatment: 18 History of Present Illness HPI Description: ADMISSION 08/23/2020 This is a 80 year old woman who arrives accompanied by her sister-in-law. 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 She has been dealing with chronic edema and discoloration of her legs for several years per her sister-in-law. I note that she saw Dr. Trula Slade on 05/21/2020 and he noted an extensive left leg DVT in 2019 although she is no longer on oral anticoagulants. He felt she had chronic venous insufficiency with skin changes related to this. He ordered her 20/30 stockings which were zippered stockings but I do not get the sense that the patient was all that compliant. In any case she started to develop weeping edema fluid recently coming out of the right lateral ankle serous drainage. She said it was pouring out last week. She saw her primary care provider on 08/08/2020 and was put on clindamycin which she is just finishing. She says the erythema feels better. Past medical history, aortic stenosis, chronic venous insufficiency, peripheral vascular disease, Crohn's disease which has been really recently quiescent, left hip fracture, DVT of the left leg in 2019. Notable that she has a very disabled son at home who she is the primary caregiver for Dr. Donzetta Matters quoted an ABI of 1.12 on the right with a TBI of 0.65 on the left and 0.93 and a TBI of 0.58. In our clinic today the ABI was 0.83 on the right and 0.81 on the left 7/7 the patient's wound on the right lateral ankle is healed. Her edema control is much  better. It turns out she does not have stockings but came in with Tubigrip on the left leg Readmission: 10/01/2020 on evaluation today patient appears to be doing decently well in regard to her leg. Unfortunately she does have an opening which happened as a result of her not wearing the compression socks she had something on the leg which was okay not necessarily the best but unfortunately did not include anything for the foot which is the main issue that we are finding here. With that being said I discussed with the patient that she really needs to have something for both and I think ordering her juxta lite compression wrap would be the ideal thing to do. 10/09/2020; patient I last saw in early July. At that point she had an area on the right lateral ankle which closed over. I am not really sure what happened however she is readmitted to the clinic last week. She had an area on her right lateral ankle this is closed however she has another area on the right lateral foot also quite a bit of discomfort on the right anterior foot. She has nothing open on the left leg. She comes in today with a single juxta lite stocking. She has a mid calf compression stocking she bought off [place] on the left 10/16/2020; everything that we are concerned about on the right leg is healed including the right lateral ankle and right dorsal foot. She has another juxta light for the right leg, she had 1 on the left. The venous reflux studies I ordered last week are on Thursday. I will  see her back 1 more time to review these. She had already been seen by Dr. Trula Slade 9/6; back in to discuss her reflux studies. This showed reflux in the greater saphenous vein on the right. I am going to send her back for Dr. Trula Slade to review these. She is wearing her juxta lite stockings but only had 20/30 mmHg compression. She became concerned this morning with erythema on the right leg. She says she has an appoint with Dr. Trula Slade in about 2  weeks 12/04/2020; patient presents after being discharged 1 month ago from our clinic. She states that she has been wearing her compression stockings intermittently. She has open wounds to her lower extremities bilaterally limited to skin breakdown. She has increased redness to her right foot and increased weeping to the right ankle area. She has increased pain to the right lower extremity as well. She has not followed up with vein and vascular since discharge from our clinic last month as recommended. 10/18; I see the patient was readmitted last week for again small weeping areas on the right lateral and a larger superficial weeping area on the left lateral lower legs she has an appointment with Dr. Trula Slade to review the reflux studies to see if anything could be ablated from a superficial vein point of view. We are using 3 layer compression on 10/24; patient presents for follow-up. She states she saw vein and vascular today. She reports that they recommended compression stocking. She states that the 3 layer compression was uncomfortable and would like to go down to the Kerlix/Coban. She reports increased redness, Warmth and pain to the left lower extrem 11/1; the area on the right anterior lower leg is closed. She complains of a painful area on the left lateral lower leg just above the ankle. Once again there is erythema here. I put her on Augmentin last week empirically this is not had any effect. 11/8; the right anterior lower leg is remained closed. I gave her doxycycline for the area on the left lateral lower leg just above the ankle. Wounds are measuring somewhat smaller. Still marked erythema and tenderness although I have difficulty believing that this is cellulitis. We are putting her in 2 layer compression. She has a juxta lite stocking on the right leg I note that 3 weeks ago I said that she was seeing Dr. Trula Slade of follow-up for venous reflux although I do not have anything further on  this. This will need to be researched Electronic Signature(s) Signed: 01/01/2021 4:39:28 PM By: Linton Ham MD Entered By: Linton Ham on 01/01/2021 15:58:34 -------------------------------------------------------------------------------- Physical Exam Details Patient Name: Date of Service: Milford Cage B. 01/01/2021 2:30 PM Medical Record Number: [id number] Patient Account Number: [id number] Date of Birth/Sex: Treating RN: 1940/10/08 (80 y.o. Benjaman Lobe Primary Care Provider: Allyn Kenner Other Clinician: Referring Provider: Treating Provider/Extender: Adolm Joseph in Treatment: 61 Constitutional Patient is hypertensive.. Pulse regular and within target range for patient.Marland Kitchen Respirations regular, non-labored and within target range.. Temperature is normal and within the target range for the patient.Marland Kitchen Appears in no distress. Cardiovascular Pedal pulses are palpable. Notes Wound exam; left lateral ankle. The wounds are superficial here however there is still surrounding erythema that is somewhat tender but not warm. No major edema Electronic Signature(s) Signed: 01/01/2021 4:39:28 PM By: Linton Ham MD Entered By: Linton Ham on 01/01/2021 15:59:36 -------------------------------------------------------------------------------- Physician Orders Details Patient Name: Date of Service: Milford Cage B. 01/01/2021 2:30 PM Medical Record  Number: [id number] Patient Account Number: [id number] Date of Birth/Sex: Treating RN: June 13, 1940 (80 y.o. 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 - Dr. Leanord Hawking Return A Bathing/ Shower/ Hygiene May shower with protection but do not get wound dressing(s) wet. Edema Control - Lymphedema / SCD / Other Bilateral Lower Extremities Elevate legs to the level of the heart or above for 30 minutes daily and/or when sitting, a frequency of: - throughout the day Avoid standing for long periods of time. Exercise regularly Compression stocking or Garment 20-30 mm/Hg pressure to: - right leg daily Wound Treatment Wound #4 - Ankle Wound Laterality: Left, Lateral Cleanser: Soap and Water 1 x Per Week/30 Days Discharge Instructions: May shower and wash wound with dial antibacterial soap and water prior to dressing change. Cleanser: Wound Cleanser 1 x Per Week/30 Days Discharge Instructions: Cleanse the wound with wound cleanser prior to applying a clean dressing using gauze sponges, not tissue or cotton balls. Peri-Wound Care: Triamcinolone 15 (g) 1 x Per Week/30 Days Discharge Instructions: Use triamcinolone 15 (g) mixed with lotion to lower leg Peri-Wound Care: Zinc Oxide Ointment 30g tube 1 x Per Week/30 Days Discharge Instructions: Apply Zinc Oxide to periwound with each dressing change Peri-Wound Care: Sween Lotion (Moisturizing lotion) 1 x Per Week/30 Days Discharge Instructions: Apply moisturizing lotion as directed Prim Dressing: PolyMem Silver Non-Adhesive Dressing, 4.25x4.25 in 1 x Per Week/30 Days ary Discharge Instructions: Apply to wound bed as instructed Secondary Dressing: Woven Gauze Sponge, Non-Sterile 4x4 in 1 x Per Week/30 Days Discharge Instructions: Apply over primary dressing as directed. Secondary Dressing: ABD Pad, 8x10 1 x Per Week/30 Days Discharge  Instructions: Apply over primary dressing as directed. Compression Wrap: Kerlix Roll 4.5x3.1 (in/yd) 1 x Per Week/30 Days Discharge Instructions: Apply Kerlix and Coban compression as directed. Compression Wrap: Coban Self-Adherent Wrap 4x5 (in/yd) 1 x Per Week/30 Days Discharge Instructions: Apply over Kerlix as directed. Patient Medications llergies: coconut, Latex, Natural Rubber A Notifications Medication Indication Start End 01/01/2021 doxycycline monohydrate DOSE oral 100 mg capsule - 1 capsule oral bid for a further 7 days (continuing rx) Electronic Signature(s) Signed: 01/01/2021 3:28:57 PM By: Baltazar Najjar MD Entered By: Baltazar Najjar on 01/01/2021 15:28:55 -------------------------------------------------------------------------------- Problem List Details Patient Name: Date of Service: [name] 01/01/2021 2:30 PM Medical Record Number: [id number] Patient Account Number: [id number] Date of Birth/Sex: Treating RN: 08-31-1940 (80 y.o. 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 limited to 12/04/2020 No Yes breakdown of skin I89.0 Lymphedema, not elsewhere classified 08/23/2020 No Yes Inactive Problems ICD-10 Code Description  Active Date Inactive Date L97.811 Non-pressure chronic ulcer of other part of right lower leg limited to breakdown of skin 08/23/2020 08/23/2020 L03.115 Cellulitis of right lower  limb 10/09/2020 10/09/2020 Resolved Problems Electronic Signature(s) Signed: 01/01/2021 4:39:28 PM By: Linton Ham MD Entered By: Linton Ham on 01/01/2021 15:56:34 -------------------------------------------------------------------------------- Progress Note Details Patient Name: Date of Service: Milford Cage B. 01/01/2021 2:30 PM Medical Record Number: [id number] Patient Account Number: [id number] Date of Birth/Sex: Treating RN: 1940/09/19 (80 y.o. [name], [name] Primary Care Provider: Allyn Kenner Other Clinician: Referring Provider: Treating Provider/Extender: Adolm Joseph in Treatment: 18 Subjective History of Present Illness (HPI) ADMISSION 08/23/2020 This is a 80 year old woman who arrives accompanied by her sister-in-law. 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 She has been dealing with chronic edema and discoloration of her legs for several years per her sister-in-law. I note that she saw Dr. Trula Slade on 05/21/2020 and he noted an extensive left leg DVT in 2019 although she is no longer on oral anticoagulants. He felt she had chronic venous insufficiency with skin changes related to this. He ordered her 20/30 stockings which were zippered stockings but I do not get the sense that the patient was all that compliant. In any case she started to develop weeping edema fluid recently coming out of the right lateral ankle serous drainage. She said it was pouring out last week. She saw her primary care provider on 08/08/2020 and was put on clindamycin which she is just finishing. She says the erythema feels better. Past medical history, aortic stenosis, chronic venous insufficiency, peripheral vascular disease, Crohn's disease which has been really recently quiescent, left hip fracture, DVT of the left leg in 2019. Notable that she has a very disabled son at home who she is the primary caregiver for Dr. Donzetta Matters quoted an ABI of 1.12 on the right with a TBI of  0.65 on the left and 0.93 and a TBI of 0.58. In our clinic today the ABI was 0.83 on the right and 0.81 on the left 7/7 the patient's wound on the right lateral ankle is healed. Her edema control is much better. It turns out she does not have stockings but came in with Tubigrip on the left leg Readmission: 10/01/2020 on evaluation today patient appears to be doing decently well in regard to her leg. Unfortunately she does have an opening which happened as a result of her not wearing the compression socks she had something on the leg which was okay not necessarily the best but unfortunately did not include anything for the foot which is the main issue that we are finding here. With that being said I discussed with the patient that she really needs to have something for both and I think ordering her juxta lite compression wrap would be the ideal thing to do. 10/09/2020; patient I last saw in early July. At that point she had an area on the right lateral ankle which closed over. I am not really sure what happened however she is readmitted to the clinic last week. She had an area on her right lateral ankle this is closed however she has another area on the right lateral foot also quite a bit of discomfort on the right anterior foot. She has nothing open on the left leg. She comes in today with a single juxta lite stocking. She has a mid calf compression stocking she bought off [place] on the left 10/16/2020; everything that we are concerned  about on the right leg is healed including the right lateral ankle and right dorsal foot. She has another juxta light for the right leg, she had 1 on the left. The venous reflux studies I ordered last week are on Thursday. I will see her back 1 more time to review these. She had already been seen by Dr. Trula Slade 9/6; back in to discuss her reflux studies. This showed reflux in the greater saphenous vein on the right. I am going to send her back for Dr. Trula Slade to  review these. She is wearing her juxta lite stockings but only had 20/30 mmHg compression. She became concerned this morning with erythema on the right leg. She says she has an appoint with Dr. Trula Slade in about 2 weeks 12/04/2020; patient presents after being discharged 1 month ago from our clinic. She states that she has been wearing her compression stockings intermittently. She has open wounds to her lower extremities bilaterally limited to skin breakdown. She has increased redness to her right foot and increased weeping to the right ankle area. She has increased pain to the right lower extremity as well. She has not followed up with vein and vascular since discharge from our clinic last month as recommended. 10/18; I see the patient was readmitted last week for again small weeping areas on the right lateral and a larger superficial weeping area on the left lateral lower legs she has an appointment with Dr. Trula Slade to review the reflux studies to see if anything could be ablated from a superficial vein point of view. We are using 3 layer compression on 10/24; patient presents for follow-up. She states she saw vein and vascular today. She reports that they recommended compression stocking. She states that the 3 layer compression was uncomfortable and would like to go down to the Kerlix/Coban. She reports increased redness, Warmth and pain to the left lower extrem 11/1; the area on the right anterior lower leg is closed. She complains of a painful area on the left lateral lower leg just above the ankle. Once again there is erythema here. I put her on Augmentin last week empirically this is not had any effect. 11/8; the right anterior lower leg is remained closed. I gave her doxycycline for the area on the left lateral lower leg just above the ankle. Wounds are measuring somewhat smaller. Still marked erythema and tenderness although I have difficulty believing that this is cellulitis. We are putting  her in 2 layer compression. She has a juxta lite stocking on the right leg I note that 3 weeks ago I said that she was seeing Dr. Trula Slade of follow-up for venous reflux although I do not have anything further on this. This will need to be researched Objective Constitutional Patient is hypertensive.. Pulse regular and within target range for patient.Marland Kitchen Respirations regular, non-labored and within target range.. Temperature is normal and within the target range for the patient.Marland Kitchen Appears in no distress. Vitals Time Taken: 2:26 PM, Height: 63 in, Source: Stated, Weight: 182 lbs, Source: Stated, BMI: 32.2, Temperature: 98.6 F, Pulse: 80 bpm, Respiratory Rate: 18 breaths/min, Blood Pressure: 156/75 mmHg. Cardiovascular Pedal pulses are palpable. General Notes: Wound exam; left lateral ankle. The wounds are superficial here however there is still surrounding erythema that is somewhat tender but not warm. No major edema Integumentary (Hair, Skin) Wound #4 status is Open. Original cause of wound was Blister. The date acquired was: 11/26/2020. The wound has been in treatment 4 weeks. The wound is located  on the Left,Lateral Ankle. The wound measures 1.8cm length x 2.4cm width x 0.1cm depth; 3.393cm^2 area and 0.339cm^3 volume. There is Fat Layer (Subcutaneous Tissue) exposed. There is no tunneling or undermining noted. There is a large amount of serous drainage noted. The wound margin is flat and intact. There is large (67-100%) pink granulation within the wound bed. There is no necrotic tissue within the wound bed. 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 - Dr. Dellia Nims Bathing/ Shower/ Hygiene: May shower with protection but do not get wound dressing(s) wet. Edema Control - Lymphedema / SCD / Other: Elevate legs to the level of the heart or above for 30 minutes daily and/or when sitting, a frequency of: - throughout the day Avoid standing for long periods of time. Exercise regularly Compression stocking or Garment 20-30 mm/Hg pressure to: - right leg daily The following medication(s) was prescribed: doxycycline monohydrate oral 100 mg capsule 1 capsule oral bid for a further 7 days (continuing rx) starting 01/01/2021 WOUND #4: - Ankle Wound Laterality: Left, Lateral Cleanser: Soap and Water 1 x Per Week/30 Days Discharge Instructions: May shower and wash wound with dial antibacterial soap and water prior to dressing change. Cleanser: Wound Cleanser 1 x Per Week/30 Days Discharge Instructions: Cleanse the wound with wound cleanser prior to applying a clean dressing using gauze sponges, not tissue or cotton balls. Peri-Wound Care: Triamcinolone 15 (g) 1 x Per Week/30 Days Discharge Instructions: Use triamcinolone 15 (g) mixed with lotion to lower leg Peri-Wound Care: Zinc Oxide Ointment 30g tube 1 x Per Week/30 Days Discharge Instructions: Apply Zinc Oxide to periwound with each dressing change Peri-Wound Care: Sween Lotion (Moisturizing lotion) 1 x Per Week/30 Days Discharge Instructions: Apply moisturizing lotion as directed Prim Dressing: PolyMem Silver Non-Adhesive Dressing, 4.25x4.25 in 1 x Per Week/30 Days ary Discharge Instructions: Apply to wound bed as instructed Secondary Dressing: Woven Gauze Sponge, Non-Sterile 4x4 in 1 x Per Week/30 Days Discharge Instructions: Apply over primary dressing as directed. Secondary Dressing: ABD Pad, 8x10 1 x Per Week/30 Days Discharge Instructions: Apply over primary dressing as directed. Com pression Wrap: Kerlix Roll 4.5x3.1  (in/yd) 1 x Per Week/30 Days Discharge Instructions: Apply Kerlix and Coban compression as directed. Com pression Wrap: Coban Self-Adherent Wrap 4x5 (in/yd) 1 x Per Week/30 Days Discharge Instructions: Apply over Kerlix as directed. #1 I put steroid on the erythematous area. Still polymen on the wounds 2. Renewed her doxycycline for both antibiotic and anti-inflammatory effects 3. We will need to check Dr. Stephens Shire review of this which I referenced 3 weeks ago Electronic Signature(s) Signed: 01/01/2021 4:39:28 PM By: Linton Ham MD Entered By: Linton Ham on 01/01/2021 16:00:44 -------------------------------------------------------------------------------- SuperBill Details Patient Name: Date of Service: Milford Cage B. 01/01/2021 Medical Record Number: [id number] Patient Account Number: [id number] Date of Birth/Sex: Treating RN: 14-Jan-1941 (80 y.o. 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 I89.0 Lymphedema, not elsewhere classified Facility Procedures CPT4 Code: [id number] Description: [id number] - WOUND CARE VISIT-LEV 3 EST PT Modifier: Quantity: 1 Physician Procedures : CPT4 Code Description Modifier [id number] - WC PHYS LEVEL 3 - EST PT ICD-10 Diagnosis Description [id number] 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

## 2021-01-02 NOTE — Progress Notes (Signed)
Barbara, Lucero ([id number]) Visit Report for 01/01/2021 Arrival Information Details Patient Name: Date of Service: Barbara Lucero 01/01/2021 2:30 PM Medical Record Number: [id number] Patient Account Number: [id number] Date of Birth/Sex: Treating RN: Jul 06, 1940 (80 y.o. Billy Coast, Linda Primary Care Monzerrat Wellen: [name] Other Clinician: Referring Suheyla Mortellaro: Treating Syriah Delisi/Extender: [name] in Treatment: 18 Visit Information History Since Last Visit Added or deleted any medications: No Patient Arrived: [name] Any new allergies or adverse reactions: No Arrival Time: 14:25 Had a fall or experienced change in No Accompanied By: sister in law activities of daily living that may affect Transfer Assistance: None risk of falls: Patient Identification Verified: Yes Signs or symptoms of abuse/neglect since last visito No Secondary Verification Process Completed: Yes Hospitalized since last visit: No Patient Requires Transmission-Based Precautions: No Implantable device outside of the clinic excluding No Patient Has Alerts: No cellular tissue based products placed in the center since last visit: Has Dressing in Place as Prescribed: Yes Has Compression in Place as Prescribed: Yes Pain Present Now: No Electronic Signature(s) Signed: 01/01/2021 5:12:04 PM By: Zenaida Deed RN, BSN Entered By: Zenaida Deed on 01/01/2021 14:26:23 -------------------------------------------------------------------------------- Clinic Level of Care Assessment Details Patient Name: Date of Service: Barbara Sheets B. 01/01/2021 2:30 PM Medical Record Number: [id number] Patient Account Number: [id number] Date of Birth/Sex: Treating RN: [birth year] (80 y.o. Billy Coast, Linda Primary Care Chosen Garron: [name] Other Clinician: Referring Naijah Lacek: Treating Maalik Pinn/Extender: [name] in Treatment: 18 Clinic Level of Care Assessment Items TOOL 4 Quantity  Score []  - 0 Use when only an EandM is performed on FOLLOW-UP visit ASSESSMENTS - Nursing Assessment / Reassessment X- 1 10 Reassessment of Co-morbidities (includes updates in patient status) X- 1 5 Reassessment of Adherence to Treatment Plan ASSESSMENTS - Wound and Skin A ssessment / Reassessment X - Simple Wound Assessment / Reassessment - one wound 1 5 []  - 0 Complex Wound Assessment / Reassessment - multiple wounds []  - 0 Dermatologic / Skin Assessment (not related to wound area) ASSESSMENTS - Focused Assessment X- 1 5 Circumferential Edema Measurements - multi extremities []  - 0 Nutritional Assessment / Counseling / Intervention X- 1 5 Lower Extremity Assessment (monofilament, tuning fork, pulses) []  - 0 Peripheral Arterial Disease Assessment (using hand held doppler) ASSESSMENTS - Ostomy and/or Continence Assessment and Care []  - 0 Incontinence Assessment and Management []  - 0 Ostomy Care Assessment and Management (repouching, etc.) PROCESS - Coordination of Care X - Simple Patient / Family Education for ongoing care 1 15 []  - 0 Complex (extensive) Patient / Family Education for ongoing care X- 1 10 Staff obtains , Records, T Results / Process Orders est []  - 0 Staff telephones HHA, Nursing Homes / Clarify orders / etc []  - 0 Routine Transfer to another Facility (non-emergent condition) []  - 0 Routine Hospital Admission (non-emergent condition) []  - 0 New Admissions / / Ordering NPWT Apligraf, etc. , []  - 0 Emergency Hospital Admission (emergent condition) X- 1 10 Simple Discharge Coordination []  - 0 Complex (extensive) Discharge Coordination PROCESS - Special Needs []  - 0 Pediatric / Minor Patient Management []  - 0 Isolation Patient Management []  - 0 Hearing / Language / Visual special needs []  - 0 Assessment of Community assistance (transportation, D/C planning, etc.) []  - 0 Additional assistance / Altered  mentation []  - 0 Support Surface(s) Assessment (bed, cushion, seat, etc.) INTERVENTIONS - Wound Cleansing / Measurement X - Simple Wound Cleansing - one wound  1 5 []  - 0 Complex Wound Cleansing - multiple wounds X- 1 5 Wound Imaging (photographs - any number of wounds) []  - 0 Wound Tracing (instead of photographs) X- 1 5 Simple Wound Measurement - one wound []  - 0 Complex Wound Measurement - multiple wounds INTERVENTIONS - Wound Dressings X - Small Wound Dressing one or multiple wounds 1 10 []  - 0 Medium Wound Dressing one or multiple wounds []  - 0 Large Wound Dressing one or multiple wounds X- 1 5 Application of Medications - topical []  - 0 Application of Medications - injection INTERVENTIONS - Miscellaneous []  - 0 External ear exam []  - 0 Specimen Collection (cultures, biopsies, blood, body fluids, etc.) []  - 0 Specimen(s) / Culture(s) sent or taken to Lab for analysis []  - 0 Patient Transfer (multiple staff / / Similar devices) []  - 0 Simple Staple / Suture removal (25 or less) []  - 0 Complex Staple / Suture removal (26 or more) []  - 0 Hypo / Hyperglycemic Management (close monitor of Blood Glucose) []  - 0 Ankle / Brachial Index (ABI) - do not check if billed separately X- 1 5 Vital Signs Has the patient been seen at the hospital within the last three years: Yes Total Score: 100 Level Of Care: New/Established - Level 3 Electronic Signature(s) Signed: 01/01/2021 5:12:04 PM By: RN, BSN Entered By: on 01/01/2021 15:19:14 -------------------------------------------------------------------------------- Encounter Discharge Information Details Patient Name: Date of Service: B. 01/01/2021 2:30 PM Medical Record Number: Patient Account Number: Date of Birth/Sex: Treating RN: 1940-06-21 (80 y.o. Primary Care Franziska Podgurski: Other Clinician: Referring  Beatrice Sehgal: Treating Zaydn Gutridge/Extender: in Treatment: 18 Encounter Discharge Information Items Discharge Condition: Stable Ambulatory Status: Walker Discharge Destination: Home Transportation: Private Auto Accompanied By: sister in law Schedule Follow-up Appointment: Yes Clinical Summary of Care: Patient Declined Electronic Signature(s) Signed: 01/01/2021 5:12:04 PM By: Zenaida Deed RN, BSN Entered By: Zenaida Deed on 01/01/2021 16:00:08 -------------------------------------------------------------------------------- Lower Extremity Assessment Details Patient Name: Date of Service: Barbara Sheets B. 01/01/2021 2:30 PM Medical Record Number: [id number] Patient Account Number: [id number] Date of Birth/Sex: Treating RN: Jul 03, 1940 (80 y.o. Tommye Standard Primary Care Dimas Scheck: [name] Other Clinician: Referring Maxen Rowland: Treating Zeeshan Korte/Extender: [name] in Treatment: 18 Edema Assessment Assessed: 19: No] [Right: No] Edema: [Left: Ye] [Right: s] Calf Left: Right: Point of Measurement: 29 cm From Medial Instep 34 cm Ankle Left: Right: Point of Measurement: 10 cm From Medial Instep 21.2 cm Vascular Assessment Pulses: Dorsalis Pedis Palpable: [Left:Yes] Electronic Signature(s) Signed: 01/01/2021 5:12:04 PM By: Zenaida Deed RN, BSN Entered By: Zenaida Deed on 01/01/2021 14:29:27 -------------------------------------------------------------------------------- Multi Wound Chart Details Patient Name: Date of Service: Barbara Sheets B. 01/01/2021 2:30 PM Medical Record Number: [id number] Patient Account Number: [id number] Date of Birth/Sex: Treating RN: 1941-02-24 (80 y.o. Tommye Standard, [name] Primary Care Eloy Fehl: [name] Other Clinician: Referring Salik Grewell: Treating Dinorah Masullo/Extender: [name] in Treatment: 18 Vital Signs Height(in): 63 Pulse(bpm): 80 Weight(lbs):  182 Blood Pressure(mmHg): 156/75 Body Mass Index(BMI): 32 Temperature(F): 98.6 Respiratory Rate(breaths/min): 18 Photos: [N/A:N/A] Left, Lateral Ankle N/A N/A Wound Location: Blister N/A N/A Wounding Event: Venous Leg Ulcer N/A N/A Primary Etiology: Cataracts, Deep Vein Thrombosis, N/A N/A Comorbid History: Peripheral Venous Disease, Crohns, Osteoarthritis, Confinement Anxiety 11/26/2020 N/A N/A Date Acquired: 4 N/A N/A Weeks of Treatment: Open N/A N/A Wound Status: 1.8x2.4x0.1 N/A N/A Measurements L  x W x D (cm) 3.393 N/A N/A A (cm) : rea 0.339 N/A N/A Volume (cm) : -2061.10% N/A N/A % Reduction in Area: -2018.70% N/A N/A % Reduction in Volume: Full Thickness Without Exposed N/A N/A Classification: Support Structures Large N/A N/A Exudate Amount: Serous N/A N/A Exudate Type: amber N/A N/A Exudate Color: Flat and Intact N/A N/A Wound Margin: Large (67-100%) N/A N/A Granulation Amount: Pink N/A N/A Granulation Quality: None Present (0%) N/A N/A Necrotic Amount: Fat Layer (Subcutaneous Tissue): Yes N/A N/A Exposed Structures: Fascia: No Tendon: No Muscle: No Joint: No Bone: No Medium (34-66%) N/A N/A Epithelialization: Treatment Notes Electronic Signature(s) Signed: 01/01/2021 4:39:28 PM By: Baltazar Najjar MD Signed: 01/01/2021 4:45:49 PM By: Fonnie Mu RN Entered By: Baltazar Najjar on 01/01/2021 15:56:44 -------------------------------------------------------------------------------- Multi-Disciplinary Care Plan Details Patient Name: Date of Service: Barbara Sheets B. 01/01/2021 2:30 PM Medical Record Number: [id number] Patient Account Number: [id number] Date of Birth/Sex: Treating RN: 1940/05/24 (80 y.o. Tommye Standard Primary Care Reverie Vaquera: [name] Other Clinician: Referring Caris Cerveny: Treating Dmetrius Ambs/Extender: [name] in Treatment: 18 Multidisciplinary Care Plan reviewed with physician Active  Inactive Venous Leg Ulcer Nursing Diagnoses: Potential for venous Insuffiency (use before diagnosis confirmed) Goals: Patient will maintain optimal edema control Date Initiated: 08/23/2020 Target Resolution Date: 02/07/2021 Goal Status: Active Interventions: Assess peripheral edema status every visit. Provide education on venous insufficiency Treatment Activities: Non-invasive vascular studies : 08/23/2020 T ordered outside of clinic : 08/23/2020 est Therapeutic compression applied : 08/23/2020 Notes: Wound/Skin Impairment Nursing Diagnoses: Knowledge deficit related to ulceration/compromised skin integrity Goals: Patient/caregiver will verbalize understanding of skin care regimen Date Initiated: 08/23/2020 Target Resolution Date: 02/07/2021 Goal Status: Active Ulcer/skin breakdown will have a volume reduction of 30% by week 4 Date Initiated: 10/01/2020 Date Inactivated: 12/17/2020 Target Resolution Date: 01/08/2021 Unmet Reason: see wound Goal Status: Unmet measurements. Interventions: Assess patient/caregiver ability to obtain necessary supplies Assess patient/caregiver ability to perform ulcer/skin care regimen upon admission and as needed Provide education on ulcer and skin care Treatment Activities: Skin care regimen initiated : 08/23/2020 Topical wound management initiated : 08/23/2020 Notes: Electronic Signature(s) Signed: 01/01/2021 5:12:04 PM By: Zenaida Deed RN, BSN Entered By: Zenaida Deed on 01/01/2021 14:31:43 -------------------------------------------------------------------------------- Pain Assessment Details Patient Name: Date of Service: Barbara Sheets B. 01/01/2021 2:30 PM Medical Record Number: [id number] Patient Account Number: [id number] Date of Birth/Sex: Treating RN: May 15, 1940 (79 y.o. Tommye Standard Primary Care Gevin Perea: [name] Other Clinician: Referring Zacory Fiola: Treating Othell Diluzio/Extender: [name] in  Treatment: 18 Active Problems Location of Pain Severity and Description of Pain Patient Has Paino Yes Site Locations Pain Location: Pain in Ulcers With Dressing Change: Yes Duration of the Pain. Constant / Intermittento Intermittent Rate the pain. Current Pain Level: 0 Worst Pain Level: 8 Least Pain Level: 0 Character of Pain Describe the Pain: Aching Pain Management and Medication Current Pain Management: Medication: Yes Is the Current Pain Management Adequate: Adequate How does your wound impact your activities of daily livingo Sleep: Yes Bathing: No Appetite: No Relationship With Others: No Bladder Continence: No Emotions: No Bowel Continence: No Work: No Toileting: No Drive: No Dressing: No Hobbies: No Electronic Signature(s) Signed: 01/01/2021 5:12:04 PM By: Zenaida Deed RN, BSN Entered By: Zenaida Deed on 01/01/2021 14:27:46 -------------------------------------------------------------------------------- Patient/Caregiver Education Details Patient Name: Date of Service: Barbara Lucero 11/8/2022andnbsp2:30 PM Medical Record Number: [id number] Patient Account Number: [id number] Date of Birth/Gender: Treating RN: 16-Jul-1940 (80 y.o. F) Zenaida Deed Primary  Care Physician: [name] Other Clinician: Referring Physician: Treating Physician/Extender: [name] in Treatment: 18 Education Assessment Education Provided To: Patient Education Topics Provided Venous: Methods: Explain/Verbal Responses: Reinforcements needed, State content correctly Wound/Skin Impairment: Methods: Explain/Verbal Responses: Reinforcements needed, State content correctly Electronic Signature(s) Signed: 01/01/2021 5:12:04 PM By: Zenaida Deed RN, BSN Entered By: Zenaida Deed on 01/01/2021 14:32:12 -------------------------------------------------------------------------------- Wound Assessment Details Patient Name: Date of Service: Barbara Sheets B. 01/01/2021 2:30 PM Medical Record Number: [id number] Patient Account Number: [id number] Date of Birth/Sex: Treating RN: 05/13/1940 (80 y.o. Billy Coast, Linda Primary Care Teodoro Jeffreys: [name] Other Clinician: Referring Aundreya Souffrant: Treating Elbie Statzer/Extender: [name] in Treatment: 18 Wound Status Wound Number: 4 Primary Venous Leg Ulcer Etiology: Wound Location: Left, Lateral Ankle Wound Open Wounding Event: Blister Status: Date Acquired: 11/26/2020 Comorbid Cataracts, Deep Vein Thrombosis, Peripheral Venous Disease, Weeks Of Treatment: 4 History: Crohns, Osteoarthritis, Confinement Anxiety Clustered Wound: No Photos Wound Measurements Length: (cm) 1.8 Width: (cm) 2.4 Depth: (cm) 0.1 Area: (cm) 3.393 Volume: (cm) 0.339 % Reduction in Area: -2061.1% % Reduction in Volume: -2018.7% Epithelialization: Medium (34-66%) Tunneling: No Undermining: No Wound Description Classification: Full Thickness Without Exposed Support Structures Wound Margin: Flat and Intact Exudate Amount: Large Exudate Type: Serous Exudate Color: amber Foul Odor After Cleansing: No Slough/Fibrino No Wound Bed Granulation Amount: Large (67-100%) Exposed Structure Granulation Quality: Pink Fascia Exposed: No Necrotic Amount: None Present (0%) Fat Layer (Subcutaneous Tissue) Exposed: Yes Tendon Exposed: No Muscle Exposed: No Joint Exposed: No Bone Exposed: No Treatment Notes Wound #4 (Ankle) Wound Laterality: Left, Lateral Cleanser Soap and Water Discharge Instruction: May shower and wash wound with dial antibacterial soap and water prior to dressing change. Wound Cleanser Discharge Instruction: Cleanse the wound with wound cleanser prior to applying a clean dressing using gauze sponges, not tissue or cotton balls. Peri-Wound Care Triamcinolone 15 (g) Discharge Instruction: Use triamcinolone 15 (g) mixed with lotion to lower leg Zinc Oxide Ointment 30g  tube Discharge Instruction: Apply Zinc Oxide to periwound with each dressing change Sween Lotion (Moisturizing lotion) Discharge Instruction: Apply moisturizing lotion as directed Topical Primary Dressing PolyMem Silver Non-Adhesive Dressing, 4.25x4.25 in Discharge Instruction: Apply to wound bed as instructed Secondary Dressing Woven Gauze Sponge, Non-Sterile 4x4 in Discharge Instruction: Apply over primary dressing as directed. ABD Pad, 8x10 Discharge Instruction: Apply over primary dressing as directed. Secured With Compression Wrap Kerlix Roll 4.5x3.1 (in/yd) Discharge Instruction: Apply Kerlix and Coban compression as directed. Coban Self-Adherent Wrap 4x5 (in/yd) Discharge Instruction: Apply over Kerlix as directed. Compression Stockings Add-Ons Electronic Signature(s) Signed: 01/01/2021 5:12:04 PM By: Zenaida Deed RN, BSN Signed: 01/02/2021 1:53:08 PM By: Karl Ito Entered By: Karl Ito on 01/01/2021 14:31:28 -------------------------------------------------------------------------------- Vitals Details Patient Name: Date of Service: Barbara Sheets B. 01/01/2021 2:30 PM Medical Record Number: [id number] Patient Account Number: [id number] Date of Birth/Sex: Treating RN: 1940-08-30 (80 y.o. Tommye Standard Primary Care Pietra Zuluaga: Other Clinician: [name] Referring Delwin Raczkowski: Treating Myli Pae/Extender: [name] in Treatment: 18 Vital Signs Time Taken: 14:26 Temperature (F): 98.6 Height (in): 63 Pulse (bpm): 80 Source: Stated Respiratory Rate (breaths/min): 18 Weight (lbs): 182 Blood Pressure (mmHg): 156/75 Source: Stated Reference Range: 80 - 120 mg / dl Body Mass Index (BMI): 32.2 Electronic Signature(s) Signed: 01/01/2021 5:12:04 PM By: Zenaida Deed RN, BSN Entered By: Zenaida Deed on 01/01/2021 14:27:03

## 2021-01-08 ENCOUNTER — Encounter (HOSPITAL_BASED_OUTPATIENT_CLINIC_OR_DEPARTMENT_OTHER): Payer: Medicare Other | Admitting: Internal Medicine

## 2021-01-08 ENCOUNTER — Other Ambulatory Visit: Payer: Self-pay

## 2021-01-08 DIAGNOSIS — L03116 Cellulitis of left lower limb: Secondary | ICD-10-CM | POA: Diagnosis not present

## 2021-01-08 DIAGNOSIS — I87331 Chronic venous hypertension (idiopathic) with ulcer and inflammation of right lower extremity: Secondary | ICD-10-CM | POA: Diagnosis not present

## 2021-01-08 DIAGNOSIS — I739 Peripheral vascular disease, unspecified: Secondary | ICD-10-CM | POA: Diagnosis not present

## 2021-01-08 DIAGNOSIS — L97811 Non-pressure chronic ulcer of other part of right lower leg limited to breakdown of skin: Secondary | ICD-10-CM | POA: Diagnosis not present

## 2021-01-08 DIAGNOSIS — I87332 Chronic venous hypertension (idiopathic) with ulcer and inflammation of left lower extremity: Secondary | ICD-10-CM | POA: Diagnosis not present

## 2021-01-08 DIAGNOSIS — L97322 Non-pressure chronic ulcer of left ankle with fat layer exposed: Secondary | ICD-10-CM | POA: Diagnosis not present

## 2021-01-08 DIAGNOSIS — I890 Lymphedema, not elsewhere classified: Secondary | ICD-10-CM | POA: Diagnosis not present

## 2021-01-08 DIAGNOSIS — I97821 Postprocedural cerebrovascular infarction during other surgery: Secondary | ICD-10-CM | POA: Diagnosis not present

## 2021-01-08 DIAGNOSIS — L97921 Non-pressure chronic ulcer of unspecified part of left lower leg limited to breakdown of skin: Secondary | ICD-10-CM | POA: Diagnosis not present

## 2021-01-08 DIAGNOSIS — Z86718 Personal history of other venous thrombosis and embolism: Secondary | ICD-10-CM | POA: Diagnosis not present

## 2021-01-08 NOTE — Progress Notes (Signed)
ALEXARAE, LEVITON ([id number]) Visit Report for 01/08/2021 HPI Details Patient Name: Date of Service: Barbara Lucero 01/08/2021 1:45 PM Medical Record Number: [id number] Patient Account Number: [id number] Date of Birth/Sex: Treating RN: [birth year] (80 y.o. [name], [name] Primary Care Provider: Allyn Kenner Other Clinician: Referring Provider: Treating Provider/Extender: Adolm Joseph in Treatment: 19 History of Present Illness HPI Description: ADMISSION 08/23/2020 This is a 80 year old woman who arrives accompanied by her sister-in-law. 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 She has been dealing with chronic edema and discoloration of her legs for several years per her sister-in-law. I note that she saw Dr. Trula Slade on 05/21/2020 and he noted an extensive left leg DVT in 2019 although she is no longer on oral anticoagulants. He felt she had chronic venous insufficiency with skin changes related to this. He ordered her 20/30 stockings which were zippered stockings but I do not get the sense that the patient was all that compliant. In any case she started to develop weeping edema fluid recently coming out of the right lateral ankle serous drainage. She said it was pouring out last week. She saw her primary care provider on 08/08/2020 and was put on clindamycin which she is just finishing. She says the erythema feels better. Past medical history, aortic stenosis, chronic venous insufficiency, peripheral vascular disease, Crohn's disease which has been really recently quiescent, left hip fracture, DVT of the left leg in 2019. Notable that she has a very disabled son at home who she is the primary caregiver for Dr. Donzetta Matters quoted an ABI of 1.12 on the right with a TBI of 0.65 on the left and 0.93 and a TBI of 0.58. In our clinic today the ABI was 0.83 on the right and 0.81 on the left 7/7 the patient's wound on the right lateral ankle is healed. Her edema control is much  better. It turns out she does not have stockings but came in with Tubigrip on the left leg Readmission: 10/01/2020 on evaluation today patient appears to be doing decently well in regard to her leg. Unfortunately she does have an opening which happened as a result of her not wearing the compression socks she had something on the leg which was okay not necessarily the best but unfortunately did not include anything for the foot which is the main issue that we are finding here. With that being said I discussed with the patient that she really needs to have something for both and I think ordering her juxta lite compression wrap would be the ideal thing to do. 10/09/2020; patient I last saw in early July. At that point she had an area on the right lateral ankle which closed over. I am not really sure what happened however she is readmitted to the clinic last week. She had an area on her right lateral ankle this is closed however she has another area on the right lateral foot also quite a bit of discomfort on the right anterior foot. She has nothing open on the left leg. She comes in today with a single juxta lite stocking. She has a mid calf compression stocking she bought off [place] on the left 10/16/2020; everything that we are concerned about on the right leg is healed including the right lateral ankle and right dorsal foot. She has another juxta light for the right leg, she had 1 on the left. The venous reflux studies I ordered last week are on Thursday. I will  see her back 1 more time to review these. She had already been seen by Dr. Trula Slade 9/6; back in to discuss her reflux studies. This showed reflux in the greater saphenous vein on the right. I am going to send her back for Dr. Trula Slade to review these. She is wearing her juxta lite stockings but only had 20/30 mmHg compression. She became concerned this morning with erythema on the right leg. She says she has an appoint with Dr. Trula Slade in about 2  weeks 12/04/2020; patient presents after being discharged 1 month ago from our clinic. She states that she has been wearing her compression stockings intermittently. She has open wounds to her lower extremities bilaterally limited to skin breakdown. She has increased redness to her right foot and increased weeping to the right ankle area. She has increased pain to the right lower extremity as well. She has not followed up with vein and vascular since discharge from our clinic last month as recommended. 10/18; I see the patient was readmitted last week for again small weeping areas on the right lateral and a larger superficial weeping area on the left lateral lower legs she has an appointment with Dr. Trula Slade to review the reflux studies to see if anything could be ablated from a superficial vein point of view. We are using 3 layer compression on 10/24; patient presents for follow-up. She states she saw vein and vascular today. She reports that they recommended compression stocking. She states that the 3 layer compression was uncomfortable and would like to go down to the Kerlix/Coban. She reports increased redness, Warmth and pain to the left lower extrem 11/1; the area on the right anterior lower leg is closed. She complains of a painful area on the left lateral lower leg just above the ankle. Once again there is erythema here. I put her on Augmentin last week empirically this is not had any effect. 11/8; the right anterior lower leg is remained closed. I gave her doxycycline for the area on the left lateral lower leg just above the ankle. Wounds are measuring somewhat smaller. Still marked erythema and tenderness although I have difficulty believing that this is cellulitis. We are putting her in 2 layer compression. She has a juxta lite stocking on the right leg I note that 3 weeks ago I said that she was seeing Dr. Trula Slade of follow-up for venous reflux although I do not have anything further on  this. This will need to be researched 11/15; right anterior lower leg remains closed. We have been using polymen to the left lateral ankle area. I put TCA to this area to help with the inflammation. I also gave her doxycycline although I had my doubts that this was cellulitis. 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 01/08/2021 1:45 PM Medical Record Number: [id number] Patient Account Number: [id number] Date of Birth/Sex: Treating RN: 1940-05-13 (80 y.o. Benjaman Lobe Primary Care Provider: Allyn Kenner Other Clinician: Referring Provider: Treating Provider/Extender: Adolm Joseph in Treatment: 19 Constitutional Patient is hypertensive.. Pulse regular and within target range for patient.Marland Kitchen Respirations regular, non-labored and within target range.. Temperature is normal and within the target range for the patient.Marland Kitchen Appears in no distress. Cardiovascular Pedal pulses palpable.  Good edema control. Notes Wound exam left lateral ankle. Much better looking superficial the erythema has resolved there is no tenderness we have good edema control. Peripheral pulses are palpable Electronic Signature(s) Signed: 01/08/2021 4:37:23 PM By: Linton Ham MD Entered By: Linton Ham on 01/08/2021 15:02:55 -------------------------------------------------------------------------------- Physician Orders Details Patient Name: Date of Service: [place] Cage B. 01/08/2021 1:45 PM Medical Record Number: [id number] Patient Account Number: [id number] Date of Birth/Sex: Treating RN: 02/21/1941 (80 y.o. 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 - Dr. Dellia Nims Return A Nurse Visit: - 01/15/21 for wrap change Bathing/ Shower/ Hygiene May shower with protection but do not get wound dressing(s) wet. Edema Control - Lymphedema / SCD / Other Bilateral Lower Extremities Elevate legs to the level of the heart or above for 30 minutes daily and/or when sitting, a frequency of: - throughout the day Avoid standing for long periods of time. Exercise regularly Compression stocking or Garment 20-30 mm/Hg pressure to: - right leg daily Wound Treatment Wound #4 - Ankle Wound Laterality: Left, Lateral Cleanser: Soap and Water 1 x Per Week/30 Days Discharge Instructions: May shower and wash wound with dial antibacterial soap and water prior to dressing change. Cleanser: Wound Cleanser 1 x Per Week/30 Days Discharge Instructions: Cleanse the wound with wound cleanser prior to applying a clean dressing using gauze sponges, not tissue or cotton balls. Peri-Wound Care: Triamcinolone 15 (g) 1 x Per Week/30 Days Discharge Instructions: Use triamcinolone 15 (g) mixed with lotion to lower leg Peri-Wound Care: Sween Lotion (Moisturizing lotion) 1 x Per Week/30 Days Discharge Instructions: Apply moisturizing lotion as directed Prim Dressing: PolyMem Silver Non-Adhesive Dressing, 4.25x4.25 in 1 x Per  Week/30 Days ary Discharge Instructions: Apply to wound bed as instructed Secondary Dressing: Woven Gauze Sponge, Non-Sterile 4x4 in 1 x Per Week/30 Days Discharge Instructions: Apply over primary dressing as directed. Secondary Dressing: ABD Pad, 8x10 1 x Per Week/30 Days Discharge Instructions: Apply over primary dressing as directed. Compression Wrap: Kerlix Roll 4.5x3.1 (in/yd) 1 x Per Week/30 Days Discharge Instructions: Apply Kerlix and Coban compression as directed. Compression Wrap: Coban Self-Adherent Wrap 4x5 (in/yd) 1 x Per Week/30 Days Discharge Instructions: Apply over Kerlix as directed. Electronic Signature(s) Signed: 01/08/2021 4:37:23 PM By: Linton Ham MD Signed: 01/08/2021 4:38:08 PM By: Lorrin Jackson Entered By: Lorrin Jackson on 01/08/2021 14:33:48 -------------------------------------------------------------------------------- Problem List Details Patient Name: Date of Service: Mil[place] Cage B. 01/08/2021 1:45 PM Medical Record Number: [id number] Patient Account Number: [id number] Date of Birth/Sex: Treating RN: March 25, 1940 (80 y.o. 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 limited to 12/04/2020 No Yes breakdown of skin I89.0 Lymphedema, not elsewhere classified  08/23/2020 No Yes Inactive Problems ICD-10 Code Description Active Date Inactive Date L97.811 Non-pressure chronic ulcer of other part of right lower leg limited to breakdown of skin 08/23/2020 08/23/2020 L03.115 Cellulitis of right lower limb 10/09/2020  10/09/2020 I87.331 Chronic venous hypertension (idiopathic) with ulcer and inflammation of right lower 08/23/2020 08/23/2020 extremity Resolved Problems Electronic Signature(s) Signed: 01/08/2021 4:37:23 PM By: Linton Ham MD Previous Signature: 01/08/2021 2:25:06 PM Version By: Lorrin Jackson Entered By: Linton Ham on 01/08/2021 15:00:34 -------------------------------------------------------------------------------- Progress Note Details Patient Name: Date of Service: Mil[place] Cage B. 01/08/2021 1:45 PM Medical Record Number: [id number] Patient Account Number: [id number] Date of Birth/Sex: Treating RN: [birth year] (80 y.o. [name], [name] Primary Care Provider: Allyn Kenner Other Clinician: Referring Provider: Treating Provider/Extender: Adolm Joseph in Treatment: 19 Subjective History of Present Illness (HPI) ADMISSION 08/23/2020 This is a 80 year old woman who arrives accompanied by her sister-in-law. 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 She has been dealing with chronic edema and discoloration of her legs for several years per her sister-in-law. I note that she saw Dr. Trula Slade on 05/21/2020 and he noted an extensive left leg DVT in 2019 although she is no longer on oral anticoagulants. He felt she had chronic venous insufficiency with skin changes related to this. He ordered her 20/30 stockings which were zippered stockings but I do not get the sense that the patient was all that compliant. In any case she started to develop weeping edema fluid recently coming out of the right lateral ankle serous drainage. She said it was pouring out last week. She saw her primary care provider on 08/08/2020 and was put on clindamycin which she is just finishing. She says the erythema feels better. Past medical history, aortic stenosis, chronic venous insufficiency, peripheral vascular disease, Crohn's disease which has been really recently quiescent, left hip  fracture, DVT of the left leg in 2019. Notable that she has a very disabled son at home who she is the primary caregiver for Dr. Donzetta Matters quoted an ABI of 1.12 on the right with a TBI of 0.65 on the left and 0.93 and a TBI of 0.58. In our clinic today the ABI was 0.83 on the right and 0.81 on the left 7/7 the patient's wound on the right lateral ankle is healed. Her edema control is much better. It turns out she does not have stockings but came in with Tubigrip on the left leg Readmission: 10/01/2020 on evaluation today patient appears to be doing decently well in regard to her leg. Unfortunately she does have an opening which happened as a result of her not wearing the compression socks she had something on the leg which was okay not necessarily the best but unfortunately did not include anything for the foot which is the main issue that we are finding here. With that being said I discussed with the patient that she really needs to have something for both and I think ordering her juxta lite compression wrap would be the ideal thing to do. 10/09/2020; patient I last saw in early July. At that point she had an area on the right lateral ankle which closed over. I am not really sure what happened however she is readmitted to the clinic last week. She had an area on her right lateral ankle this is closed however she has another area on the right lateral foot also quite a bit of discomfort on the right anterior foot. She has nothing open on the  left leg. She comes in today with a single juxta lite stocking. She has a mid calf compression stocking she bought off [employer] on the left 10/16/2020; everything that we are concerned about on the right leg is healed including the right lateral ankle and right dorsal foot. She has another juxta light for the right leg, she had 1 on the left. The venous reflux studies I ordered last week are on Thursday. I will see her back 1 more time to review these. She had already been  seen by Dr. Myra Gianotti 9/6; back in to discuss her reflux studies. This showed reflux in the greater saphenous vein on the right. I am going to send her back for Dr. Myra Gianotti to review these. She is wearing her juxta lite stockings but only had 20/30 mmHg compression. She became concerned this morning with erythema on the right leg. She says she has an appoint with Dr. Myra Gianotti in about 2 weeks 12/04/2020; patient presents after being discharged 1 month ago from our clinic. She states that she has been wearing her compression stockings intermittently. She has open wounds to her lower extremities bilaterally limited to skin breakdown. She has increased redness to her right foot and increased weeping to the right ankle area. She has increased pain to the right lower extremity as well. She has not followed up with vein and vascular since discharge from our clinic last month as recommended. 10/18; I see the patient was readmitted last week for again small weeping areas on the right lateral and a larger superficial weeping area on the left lateral lower legs she has an appointment with Dr. Myra Gianotti to review the reflux studies to see if anything could be ablated from a superficial vein point of view. We are using 3 layer compression on 10/24; patient presents for follow-up. She states she saw vein and vascular today. She reports that they recommended compression stocking. She states that the 3 layer compression was uncomfortable and would like to go down to the Kerlix/Coban. She reports increased redness, Warmth and pain to the left lower extrem 11/1; the area on the right anterior lower leg is closed. She complains of a painful area on the left lateral lower leg just above the ankle. Once again there is erythema here. I put her on Augmentin last week empirically this is not had any effect. 11/8; the right anterior lower leg is remained closed. I gave her doxycycline for the area on the left lateral lower leg  just above the ankle. Wounds are measuring somewhat smaller. Still marked erythema and tenderness although I have difficulty believing that this is cellulitis. We are putting her in 2 layer compression. She has a juxta lite stocking on the right leg I note that 3 weeks ago I said that she was seeing Dr. Myra Gianotti of follow-up for venous reflux although I do not have anything further on this. This will need to be researched 11/15; right anterior lower leg remains closed. We have been using polymen to the left lateral ankle area. I put TCA to this area to help with the inflammation. I also gave her doxycycline although I had my doubts that this was cellulitis. 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 Pulse regular and within target range for patient.Marland Kitchen Respirations regular, non-labored and within target range.. Temperature is normal and within the target range for the patient.Marland Kitchen Appears in no distress. Vitals Time Taken: 2:05 PM, Height: 63  in, Weight: 182 lbs, BMI: 32.2, Temperature: 98 F, Pulse: 75 bpm, Respiratory Rate: 18 breaths/min, Blood Pressure: 162/92 mmHg. Cardiovascular Pedal pulses palpable. Good edema control. General Notes: Wound exam left lateral ankle. Much better looking superficial the erythema has resolved there is no tenderness we have good edema control. Peripheral pulses are palpable Integumentary (Hair, Skin) Wound #4 status is Open. Original cause of wound was Blister. The date acquired was: 11/26/2020. The wound has been in treatment 5 weeks. The wound is located on the Left,Lateral Ankle. The wound measures 1.5cm length x 1.5cm width x 0.1cm depth; 1.767cm^2 area and 0.177cm^3 volume. There is Fat Layer (Subcutaneous Tissue) exposed. There is no tunneling or undermining noted. There is a medium amount of serosanguineous drainage noted. The wound margin is distinct with the outline attached to  the wound base. There is large (67-100%) pink granulation within the wound bed. There is no necrotic tissue within the wound bed. 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 - Dr. Dellia Nims Nurse Visit: - 01/15/21 for wrap change Bathing/ Shower/ Hygiene: May shower with protection but do not get wound dressing(s) wet. Edema Control - Lymphedema / SCD / Other: Elevate legs to the level of the heart or above for 30 minutes daily and/or when sitting, a frequency of: - throughout the day Avoid standing for long periods of time. Exercise regularly Compression stocking or Garment 20-30 mm/Hg pressure to: - right leg daily WOUND #4: - Ankle Wound Laterality: Left, Lateral Cleanser: Soap and Water 1 x Per Week/30 Days Discharge Instructions: May shower and wash wound with dial antibacterial soap and water prior to dressing change. Cleanser: Wound Cleanser 1 x Per Week/30 Days Discharge Instructions: Cleanse the wound with wound cleanser prior to applying a clean dressing using gauze sponges, not tissue or cotton balls. Peri-Wound Care: Triamcinolone 15 (g) 1 x Per Week/30 Days Discharge Instructions: Use triamcinolone 15 (g) mixed with lotion to lower leg Peri-Wound Care: Sween Lotion (Moisturizing lotion) 1 x Per Week/30 Days Discharge Instructions: Apply moisturizing lotion as directed Prim Dressing: PolyMem Silver Non-Adhesive Dressing, 4.25x4.25 in 1 x Per Week/30 Days ary Discharge Instructions: Apply to wound bed as instructed Secondary Dressing: Woven Gauze Sponge, Non-Sterile 4x4 in 1 x Per Week/30 Days Discharge Instructions: Apply over primary dressing  as directed. Secondary Dressing: ABD Pad, 8x10 1 x Per Week/30 Days Discharge Instructions: Apply over primary dressing as directed. Com pression Wrap: Kerlix Roll 4.5x3.1 (in/yd) 1 x Per Week/30 Days Discharge Instructions: Apply Kerlix and Coban compression as directed. Com pression Wrap: Coban Self-Adherent Wrap 4x5 (in/yd) 1 x Per Week/30 Days Discharge Instructions: Apply over Kerlix as directed. 1. We are using polymen Ag, gauze 2. TCA to the periwound 3. We are wrapping with kerlix Coban 4. She can complete her antibiotics [doxycycline] Electronic Signature(s) Signed: 01/08/2021 4:37:23 PM By: Linton Ham MD Entered By: Linton Ham on 01/08/2021 15:06:36 -------------------------------------------------------------------------------- SuperBill Details Patient Name: Date of Service: [place] Cage B. 01/08/2021 Medical Record Number: [id number] Patient Account Number: [id number] Date of Birth/Sex: Treating RN: [birth year] (80 y.o. 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 I89.0 Lymphedema, not elsewhere classified Facility Procedures CPT4 Code: [id number] Description: 99213 - WOUND CARE VISIT-LEV 3 EST PT Modifier: Quantity: 1 Physician Procedures : CPT4 Code Description Modifier [id number] - WC PHYS LEVEL 3 - EST PT ICD-10 Diagnosis Description [id number] 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

## 2021-01-10 NOTE — Progress Notes (Signed)
NIKAYA, NASBY ([id number]) Visit Report for 01/08/2021 Arrival Information Details Patient Name: Date of Service: Barbara Lucero 01/08/2021 1:45 PM Medical Record Number: [id number] Patient Account Number: [id number] Date of Birth/Sex: Treating RN: Jun 21, 1940 (80 y.o. Roel Cluck Primary Care Shelanda Duvall: [name] Other Clinician: Referring Tannor Pyon: Treating Estephania Licciardi/Extender: [name] in Treatment: 19 Visit Information History Since Last Visit Added or deleted any medications: No Patient Arrived: [name] Any new allergies or adverse reactions: No Arrival Time: 14:04 Had a fall or experienced change in No Accompanied By: Sister in Law activities of daily living that may affect Transfer Assistance: Manual risk of falls: Patient Identification Verified: Yes Signs or symptoms of abuse/neglect since last visito No Secondary Verification Process Completed: Yes Hospitalized since last visit: No Patient Requires Transmission-Based Precautions: No Implantable device outside of the clinic excluding No Patient Has Alerts: No cellular tissue based products placed in the center since last visit: Has Dressing in Place as Prescribed: Yes Has Compression in Place as Prescribed: Yes Pain Present Now: No Electronic Signature(s) Signed: 01/08/2021 4:38:08 PM By: Antonieta Iba Entered By: Antonieta Iba on 01/08/2021 14:04:52 -------------------------------------------------------------------------------- Clinic Level of Care Assessment Details Patient Name: Date of Service: Barbara Lucero 01/08/2021 1:45 PM Medical Record Number: [id number] Patient Account Number: [id number] Date of Birth/Sex: Treating RN: November 24, 1940 (80 y.o. Roel Cluck Primary Care Miguel Christiana: [name] Other Clinician: Referring Karma Hiney: Treating Mathias Bogacki/Extender: [name] in Treatment: 19 Clinic Level of Care Assessment Items TOOL 4 Quantity Score X-  1 0 Use when only an EandM is performed on FOLLOW-UP visit ASSESSMENTS - Nursing Assessment / Reassessment X- 1 10 Reassessment of Co-morbidities (includes updates in patient status) X- 1 5 Reassessment of Adherence to Treatment Plan ASSESSMENTS - Wound and Skin A ssessment / Reassessment X - Simple Wound Assessment / Reassessment - one wound 1 5 []  - 0 Complex Wound Assessment / Reassessment - multiple wounds []  - 0 Dermatologic / Skin Assessment (not related to wound area) ASSESSMENTS - Focused Assessment []  - 0 Circumferential Edema Measurements - multi extremities []  - 0 Nutritional Assessment / Counseling / Intervention []  - 0 Lower Extremity Assessment (monofilament, tuning fork, pulses) []  - 0 Peripheral Arterial Disease Assessment (using hand held doppler) ASSESSMENTS - Ostomy and/or Continence Assessment and Care []  - 0 Incontinence Assessment and Management []  - 0 Ostomy Care Assessment and Management (repouching, etc.) PROCESS - Coordination of Care []  - 0 Simple Patient / Family Education for ongoing care X- 1 20 Complex (extensive) Patient / Family Education for ongoing care []  - 0 Staff obtains , Records, T Results / Process Orders est []  - 0 Staff telephones HHA, Nursing Homes / Clarify orders / etc []  - 0 Routine Transfer to another Facility (non-emergent condition) []  - 0 Routine Hospital Admission (non-emergent condition) []  - 0 New Admissions / / Ordering NPWT Apligraf, etc. , []  - 0 Emergency Hospital Admission (emergent condition) []  - 0 Simple Discharge Coordination []  - 0 Complex (extensive) Discharge Coordination PROCESS - Special Needs []  - 0 Pediatric / Minor Patient Management []  - 0 Isolation Patient Management []  - 0 Hearing / Language / Visual special needs []  - 0 Assessment of Community assistance (transportation, D/C planning, etc.) []  - 0 Additional assistance / Altered mentation []  -  0 Support Surface(s) Assessment (bed, cushion, seat, etc.) INTERVENTIONS - Wound Cleansing / Measurement X - Simple Wound Cleansing - one wound 1 5 []  -  0 Complex Wound Cleansing - multiple wounds X- 1 5 Wound Imaging (photographs - any number of wounds) []  - 0 Wound Tracing (instead of photographs) X- 1 5 Simple Wound Measurement - one wound []  - 0 Complex Wound Measurement - multiple wounds INTERVENTIONS - Wound Dressings []  - 0 Small Wound Dressing one or multiple wounds []  - 0 Medium Wound Dressing one or multiple wounds X- 1 20 Large Wound Dressing one or multiple wounds []  - 0 Application of Medications - topical []  - 0 Application of Medications - injection INTERVENTIONS - Miscellaneous []  - 0 External ear exam []  - 0 Specimen Collection (cultures, biopsies, blood, body fluids, etc.) []  - 0 Specimen(s) / Culture(s) sent or taken to Lab for analysis []  - 0 Patient Transfer (multiple staff / / Similar devices) []  - 0 Simple Staple / Suture removal (25 or less) []  - 0 Complex Staple / Suture removal (26 or more) []  - 0 Hypo / Hyperglycemic Management (close monitor of Blood Glucose) []  - 0 Ankle / Brachial Index (ABI) - do not check if billed separately X- 1 5 Vital Signs Has the patient been seen at the hospital within the last three years: Yes Total Score: 80 Level Of Care: New/Established - Level 3 Electronic Signature(s) Signed: 01/08/2021 4:38:08 PM By: Entered By: on 01/08/2021 14:34:43 -------------------------------------------------------------------------------- Encounter Discharge Information Details Patient Name: Date of Service: B. 01/08/2021 1:45 PM Medical Record Number: Patient Account Number: Date of Birth/Sex: Treating RN: [birth year] (80 y.o. Primary Care Azjah Pardo: Other Clinician: Referring Gamble Enderle: Treating Evalyne Cortopassi/Extender:  in Treatment: 19 Encounter Discharge Information Items Discharge Condition: Stable Ambulatory Status: Walker Discharge Destination: Home Transportation: Private Auto Accompanied By: 01/10/2021 in Antonieta Iba Schedule Follow-up Appointment: Yes Clinical Summary of Care: Provided on 01/08/2021 Form Type Recipient Paper Patient Patient Electronic Signature(s) Signed: 01/08/2021 4:38:08 PM By: Barbara Sheets Entered By: 01/10/2021 on 01/08/2021 14:48:37 -------------------------------------------------------------------------------- Lower Extremity Assessment Details Patient Name: Date of Service: [id number] 01/08/2021 1:45 PM Medical Record Number: [id number] Patient Account Number: Roel Cluck Date of Birth/Sex: Treating RN: 11-20-1940 (80 y.o. 20 Primary Care Johnatha Zeidman: Maricela Curet Other Clinician: Referring Betzy Barbier: Treating Grettel Rames/Extender: Conni Elliot in Treatment: 19 Edema Assessment Assessed: 01/10/2021: Yes] Antonieta Iba: No] Edema: [Left: Ye] [Right: s] Calf Left: Right: Point of Measurement: 29 cm From Medial Instep 35 cm Ankle Left: Right: Point of Measurement: 10 cm From Medial Instep 21 cm Vascular Assessment Pulses: Dorsalis Pedis Palpable: [Left:Yes] Electronic Signature(s) Signed: 01/08/2021 4:38:08 PM By: 01/10/2021 Entered By: Barbara Lucero on 01/08/2021 14:10:15 -------------------------------------------------------------------------------- Multi Wound Chart Details Patient Name: Date of Service: [id number] B. 01/08/2021 1:45 PM Medical Record Number: 08/27/1940 Patient Account Number: [id number] Date of Birth/Sex: Treating RN: Apr 19, 1940 (80 y.o. [name], [name] Primary Care Kanoelani Dobies: 20 Other Clinician: Referring Emanuell Morina: Treating Jahni Paul/Extender: [name] in Treatment: 19 Vital Signs Height(in): 63 Pulse(bpm): 75 Weight(lbs): 182 Blood  Pressure(mmHg): 162/92 Body Mass Index(BMI): 32 Temperature(F): 98 Respiratory Rate(breaths/min): 18 Photos: [N/A:N/A] Left, Lateral Ankle N/A N/A Wound Location: Blister N/A N/A Wounding Event: Venous Leg Ulcer N/A N/A Primary Etiology: Cataracts, Deep Vein Thrombosis, N/A N/A Comorbid History: Peripheral Venous Disease, Crohns, Osteoarthritis, Confinement Anxiety 11/26/2020 N/A N/A Date Acquired: 5 N/A N/A Weeks of Treatment: Open N/A N/A Wound Status: 1.5x1.5x0.1 N/A N/A Measurements L x W x D (  cm) 1.767 N/A N/A A (cm) : rea 0.177 N/A N/A Volume (cm) : -1025.50% N/A N/A % Reduction in Area: -1006.20% N/A N/A % Reduction in Volume: Full Thickness Without Exposed N/A N/A Classification: Support Structures Medium N/A N/A Exudate Amount: Serosanguineous N/A N/A Exudate Type: red, brown N/A N/A Exudate Color: Distinct, outline attached N/A N/A Wound Margin: Large (67-100%) N/A N/A Granulation Amount: Pink N/A N/A Granulation Quality: None Present (0%) N/A N/A Necrotic Amount: Fat Layer (Subcutaneous Tissue): Yes N/A N/A Exposed Structures: Fascia: No Tendon: No Muscle: No Joint: No Bone: No Medium (34-66%) N/A N/A Epithelialization: Maceration Noted N/A N/A Assessment Notes: Treatment Notes Wound #4 (Ankle) Wound Laterality: Left, Lateral Cleanser Soap and Water Discharge Instruction: May shower and wash wound with dial antibacterial soap and water prior to dressing change. Wound Cleanser Discharge Instruction: Cleanse the wound with wound cleanser prior to applying a clean dressing using gauze sponges, not tissue or cotton balls. Peri-Wound Care Triamcinolone 15 (g) Discharge Instruction: Use triamcinolone 15 (g) mixed with lotion to lower leg Sween Lotion (Moisturizing lotion) Discharge Instruction: Apply moisturizing lotion as directed Topical Primary Dressing PolyMem Silver Non-Adhesive Dressing, 4.25x4.25 in Discharge Instruction: Apply  to wound bed as instructed Secondary Dressing Woven Gauze Sponge, Non-Sterile 4x4 in Discharge Instruction: Apply over primary dressing as directed. ABD Pad, 8x10 Discharge Instruction: Apply over primary dressing as directed. Secured With Compression Wrap Kerlix Roll 4.5x3.1 (in/yd) Discharge Instruction: Apply Kerlix and Coban compression as directed. Coban Self-Adherent Wrap 4x5 (in/yd) Discharge Instruction: Apply over Kerlix as directed. Compression Stockings Add-Ons Electronic Signature(s) Signed: 01/08/2021 4:37:23 PM By: Baltazar Najjar MD Signed: 01/10/2021 5:03:29 PM By: Fonnie Mu RN Entered By: Baltazar Najjar on 01/08/2021 15:00:45 -------------------------------------------------------------------------------- Multi-Disciplinary Care Plan Details Patient Name: Date of Service: Barbara Sheets B. 01/08/2021 1:45 PM Medical Record Number: [id number] Patient Account Number: [id number] Date of Birth/Sex: Treating RN: [birth year] (80 y.o. Roel Cluck Primary Care Kaylin Schellenberg: [name] Other Clinician: Referring Ardie Dragoo: Treating Katheren Jimmerson/Extender: [name] in Treatment: 19 Multidisciplinary Care Plan reviewed with physician Active Inactive Venous Leg Ulcer Nursing Diagnoses: Potential for venous Insuffiency (use before diagnosis confirmed) Goals: Patient will maintain optimal edema control Date Initiated: 08/23/2020 Target Resolution Date: 02/07/2021 Goal Status: Active Interventions: Assess peripheral edema status every visit. Provide education on venous insufficiency Treatment Activities: Non-invasive vascular studies : 08/23/2020 T ordered outside of clinic : 08/23/2020 est Therapeutic compression applied : 08/23/2020 Notes: Wound/Skin Impairment Nursing Diagnoses: Knowledge deficit related to ulceration/compromised skin integrity Goals: Patient/caregiver will verbalize understanding of skin care regimen Date Initiated:  08/23/2020 Target Resolution Date: 02/07/2021 Goal Status: Active Ulcer/skin breakdown will have a volume reduction of 30% by week 4 Date Initiated: 10/01/2020 Date Inactivated: 12/17/2020 Target Resolution Date: 01/08/2021 Unmet Reason: see wound Goal Status: Unmet measurements. Interventions: Assess patient/caregiver ability to obtain necessary supplies Assess patient/caregiver ability to perform ulcer/skin care regimen upon admission and as needed Provide education on ulcer and skin care Treatment Activities: Skin care regimen initiated : 08/23/2020 Topical wound management initiated : 08/23/2020 Notes: Electronic Signature(s) Signed: 01/08/2021 2:25:59 PM By: Antonieta Iba Entered By: Antonieta Iba on 01/08/2021 14:25:58 -------------------------------------------------------------------------------- Pain Assessment Details Patient Name: Date of Service: Barbara Sheets B. 01/08/2021 1:45 PM Medical Record Number: [id number] Patient Account Number: [id number] Date of Birth/Sex: Treating RN: 01/09/1941 (80 y.o. Roel Cluck Primary Care Gael Londo: [name] Other Clinician: Referring Gretta Samons: Treating Yoshiaki Kreuser/Extender: [name] in Treatment: 19 Active Problems Location of Pain Severity  and Description of Pain Patient Has Paino No Site Locations Pain Management and Medication Current Pain Management: Electronic Signature(s) Signed: 01/08/2021 4:38:08 PM By: Antonieta Iba Entered By: Antonieta Iba on 01/08/2021 14:06:53 -------------------------------------------------------------------------------- Patient/Caregiver Education Details Patient Name: Date of Service: Barbara Lucero 11/15/2022andnbsp1:45 PM Medical Record Number: [id number] Patient Account Number: [id number] Date of Birth/Gender: Treating RN: 05/31/1940 (80 y.o. Roel Cluck Primary Care Physician: [name] Other Clinician: Referring Physician: Treating  Physician/Extender: [name] in Treatment: 19 Education Assessment Education Provided To: Patient Education Topics Provided Venous: Methods: Explain/Verbal, Printed Responses: State content correctly Wound/Skin Impairment: Methods: Explain/Verbal, Printed Responses: State content correctly Electronic Signature(s) Signed: 01/08/2021 4:38:08 PM By: Antonieta Iba Entered By: Antonieta Iba on 01/08/2021 14:26:23 -------------------------------------------------------------------------------- Wound Assessment Details Patient Name: Date of Service: Barbara Sheets B. 01/08/2021 1:45 PM Medical Record Number: [id number] Patient Account Number: [id number] Date of Birth/Sex: Treating RN: [birth year] (80 y.o. Roel Cluck Primary Care Maryan Sivak: [name] Other Clinician: Referring Celia Gibbons: Treating Lazlo Tunney/Extender: [name] in Treatment: 19 Wound Status Wound Number: 4 Primary Venous Leg Ulcer Etiology: Wound Location: Left, Lateral Ankle Wound Open Wounding Event: Blister Status: Date Acquired: 11/26/2020 Comorbid Cataracts, Deep Vein Thrombosis, Peripheral Venous Disease, Weeks Of Treatment: 5 History: Crohns, Osteoarthritis, Confinement Anxiety Clustered Wound: No Photos Wound Measurements Length: (cm) 1.5 Width: (cm) 1.5 Depth: (cm) 0.1 Area: (cm) 1.767 Volume: (cm) 0.177 % Reduction in Area: -1025.5% % Reduction in Volume: -1006.2% Epithelialization: Medium (34-66%) Tunneling: No Undermining: No Wound Description Classification: Full Thickness Without Exposed Support Structures Wound Margin: Distinct, outline attached Exudate Amount: Medium Exudate Type: Serosanguineous Exudate Color: red, brown Foul Odor After Cleansing: No Slough/Fibrino No Wound Bed Granulation Amount: Large (67-100%) Exposed Structure Granulation Quality: Pink Fascia Exposed: No Necrotic Amount: None Present (0%) Fat Layer  (Subcutaneous Tissue) Exposed: Yes Tendon Exposed: No Muscle Exposed: No Joint Exposed: No Bone Exposed: No Assessment Notes Maceration Noted Treatment Notes Wound #4 (Ankle) Wound Laterality: Left, Lateral Cleanser Soap and Water Discharge Instruction: May shower and wash wound with dial antibacterial soap and water prior to dressing change. Wound Cleanser Discharge Instruction: Cleanse the wound with wound cleanser prior to applying a clean dressing using gauze sponges, not tissue or cotton balls. Peri-Wound Care Triamcinolone 15 (g) Discharge Instruction: Use triamcinolone 15 (g) mixed with lotion to lower leg Sween Lotion (Moisturizing lotion) Discharge Instruction: Apply moisturizing lotion as directed Topical Primary Dressing PolyMem Silver Non-Adhesive Dressing, 4.25x4.25 in Discharge Instruction: Apply to wound bed as instructed Secondary Dressing Woven Gauze Sponge, Non-Sterile 4x4 in Discharge Instruction: Apply over primary dressing as directed. ABD Pad, 8x10 Discharge Instruction: Apply over primary dressing as directed. Secured With Compression Wrap Kerlix Roll 4.5x3.1 (in/yd) Discharge Instruction: Apply Kerlix and Coban compression as directed. Coban Self-Adherent Wrap 4x5 (in/yd) Discharge Instruction: Apply over Kerlix as directed. Compression Stockings Add-Ons Electronic Signature(s) Signed: 01/08/2021 4:38:08 PM By: Antonieta Iba Entered By: Antonieta Iba on 01/08/2021 14:13:34 -------------------------------------------------------------------------------- Vitals Details Patient Name: Date of Service: Barbara Sheets B. 01/08/2021 1:45 PM Medical Record Number: [id number] Patient Account Number: [id number] Date of Birth/Sex: Treating RN: February 14, 1941 (80 y.o. Roel Cluck Primary Care Demetries Coia: [name] Other Clinician: Referring Donie Lemelin: Treating Maverik Foot/Extender: [name] in Treatment: 19 Vital Signs Time Taken:  14:05 Temperature (F): 98 Height (in): 63 Pulse (bpm): 75 Weight (lbs): 182 Respiratory Rate (breaths/min): 18 Body Mass Index (BMI): 32.2 Blood Pressure (mmHg): 162/92 Reference Range: 80 -  120 mg / dl Electronic Signature(s) Signed: 01/08/2021 4:38:08 PM By: Antonieta Iba Entered By: Antonieta Iba on 01/08/2021 14:06:28

## 2021-01-15 ENCOUNTER — Other Ambulatory Visit: Payer: Self-pay

## 2021-01-15 ENCOUNTER — Encounter (HOSPITAL_BASED_OUTPATIENT_CLINIC_OR_DEPARTMENT_OTHER): Payer: Medicare Other | Admitting: Internal Medicine

## 2021-01-15 DIAGNOSIS — L97921 Non-pressure chronic ulcer of unspecified part of left lower leg limited to breakdown of skin: Secondary | ICD-10-CM | POA: Diagnosis not present

## 2021-01-15 DIAGNOSIS — L03116 Cellulitis of left lower limb: Secondary | ICD-10-CM | POA: Diagnosis not present

## 2021-01-15 DIAGNOSIS — I890 Lymphedema, not elsewhere classified: Secondary | ICD-10-CM | POA: Diagnosis not present

## 2021-01-15 DIAGNOSIS — I87331 Chronic venous hypertension (idiopathic) with ulcer and inflammation of right lower extremity: Secondary | ICD-10-CM | POA: Diagnosis not present

## 2021-01-15 DIAGNOSIS — Z86718 Personal history of other venous thrombosis and embolism: Secondary | ICD-10-CM | POA: Diagnosis not present

## 2021-01-15 DIAGNOSIS — L97811 Non-pressure chronic ulcer of other part of right lower leg limited to breakdown of skin: Secondary | ICD-10-CM | POA: Diagnosis not present

## 2021-01-15 DIAGNOSIS — I739 Peripheral vascular disease, unspecified: Secondary | ICD-10-CM | POA: Diagnosis not present

## 2021-01-15 DIAGNOSIS — I87332 Chronic venous hypertension (idiopathic) with ulcer and inflammation of left lower extremity: Secondary | ICD-10-CM | POA: Diagnosis not present

## 2021-01-15 NOTE — Progress Notes (Signed)
Barbara Lucero ([id number]) Visit Report for 01/15/2021 Arrival Information Details Patient Name: Date of Service: Barbara Lucero 01/15/2021 1:45 PM Medical Record Number: [id number] Patient Account Number: [id number] Date of Birth/Sex: Treating RN: April 01, 1940 (80 y.o. Barbara Lucero, [name] Primary Care Barbara Lucero: Barbara Lucero Other Clinician: Referring Barbara Lucero: Treating Barbara Lucero/Extender: Barbara Lucero in Treatment: 20 Visit Information History Since Last Visit Added or deleted any medications: No Patient Arrived: Ambulatory Any new allergies or adverse reactions: No Arrival Time: 13:50 Had a fall or experienced change in No Accompanied By: self activities of daily living that may affect Transfer Assistance: None risk of falls: Patient Identification Verified: Yes Signs or symptoms of abuse/neglect since last visito No Secondary Verification Process Completed: Yes Hospitalized since last visit: No Patient Requires Transmission-Based Precautions: No Implantable device outside of the clinic excluding No Patient Has Alerts: No cellular tissue based products placed in the center since last visit: Has Dressing in Place as Prescribed: Yes Has Compression in Place as Prescribed: Yes Pain Present Now: No Electronic Signature(s) Signed: 01/15/2021 5:20:05 PM By: Barbara Stall RN, BSN Entered By: Barbara Lucero on 01/15/2021 15:10:17 -------------------------------------------------------------------------------- Clinic Level of Care Assessment Details Patient Name: Date of Service: Barbara Lucero 01/15/2021 1:45 PM Medical Record Number: [id number] Patient Account Number: [id number] Date of Birth/Sex: Treating RN: [birth year] (80 y.o. Barbara Lucero, [name] Primary Care Haseeb Fiallos: Barbara Lucero Other Clinician: Referring Barbara Lucero: Treating Jadin Creque/Extender: Barbara Lucero in Treatment: 20 Clinic Level of Care Assessment Items TOOL 4 Quantity Score X- 1  0 Use when only an EandM is performed on FOLLOW-UP visit ASSESSMENTS - Nursing Assessment / Reassessment X- 1 10 Reassessment of Co-morbidities (includes updates in patient status) X- 1 5 Reassessment of Adherence to Treatment Plan ASSESSMENTS - Wound and Skin A ssessment / Reassessment X - Simple Wound Assessment / Reassessment - one wound 1 5 []  - 0 Complex Wound Assessment / Reassessment - multiple wounds X- 1 10 Dermatologic / Skin Assessment (not related to wound area) ASSESSMENTS - Focused Assessment []  - 0 Circumferential Edema Measurements - multi extremities []  - 0 Nutritional Assessment / Counseling / Intervention []  - 0 Lower Extremity Assessment (monofilament, tuning fork, pulses) []  - 0 Peripheral Arterial Disease Assessment (using hand held doppler) ASSESSMENTS - Ostomy and/or Continence Assessment and Care []  - 0 Incontinence Assessment and Management []  - 0 Ostomy Care Assessment and Management (repouching, etc.) PROCESS - Coordination of Care X - Simple Patient / Family Education for ongoing care 1 15 []  - 0 Complex (extensive) Patient / Family Education for ongoing care X- 1 10 Staff obtains , Records, T Results / Process Orders est []  - 0 Staff telephones HHA, Nursing Homes / Clarify orders / etc []  - 0 Routine Transfer to another Facility (non-emergent condition) []  - 0 Routine Hospital Admission (non-emergent condition) []  - 0 New Admissions / / Ordering NPWT Apligraf, etc. , []  - 0 Emergency Hospital Admission (emergent condition) X- 1 10 Simple Discharge Coordination []  - 0 Complex (extensive) Discharge Coordination PROCESS - Special Needs []  - 0 Pediatric / Minor Patient Management []  - 0 Isolation Patient Management []  - 0 Hearing / Language / Visual special needs []  - 0 Assessment of Community assistance (transportation, D/C planning, etc.) []  - 0 Additional assistance / Altered mentation []  -  0 Support Surface(s) Assessment (bed, cushion, seat, etc.) INTERVENTIONS - Wound Cleansing / Measurement X - Simple Wound Cleansing - one wound 1 5 []  -  0 Complex Wound Cleansing - multiple wounds X- 1 5 Wound Imaging (photographs - any number of wounds) []  - 0 Wound Tracing (instead of photographs) X- 1 5 Simple Wound Measurement - one wound []  - 0 Complex Wound Measurement - multiple wounds INTERVENTIONS - Wound Dressings []  - 0 Small Wound Dressing one or multiple wounds []  - 0 Medium Wound Dressing one or multiple wounds X- 1 20 Large Wound Dressing one or multiple wounds []  - 0 Application of Medications - topical []  - 0 Application of Medications - injection INTERVENTIONS - Miscellaneous []  - 0 External ear exam []  - 0 Specimen Collection (cultures, biopsies, blood, body fluids, etc.) []  - 0 Specimen(s) / Culture(s) sent or taken to Lab for analysis []  - 0 Patient Transfer (multiple staff / / Similar devices) []  - 0 Simple Staple / Suture removal (25 or less) []  - 0 Complex Staple / Suture removal (26 or more) []  - 0 Hypo / Hyperglycemic Management (close monitor of Blood Glucose) []  - 0 Ankle / Brachial Index (ABI) - do not check if billed separately X- 1 5 Vital Signs Has the patient been seen at the hospital within the last three years: Yes Total Score: 105 Level Of Care: New/Established - Level 3 Electronic Signature(s) Signed: 01/15/2021 5:20:05 PM By: RN, BSN Entered By: on 01/15/2021 15:13:36 -------------------------------------------------------------------------------- Encounter Discharge Information Details Patient Name: Date of Service: Lucero. 01/15/2021 1:45 PM Medical Record Number: Patient Account Number: Date of Birth/Sex: Treating RN: [birth year] (81 y.o. Primary Care Barbara Lucero: Other Clinician: Referring Barbara Lucero: Treating Barbara Lucero/Extender:  in Treatment: 20 Encounter Discharge Information Items Discharge Condition: Stable Ambulatory Status: Walker Discharge Destination: Home Transportation: Private Auto Accompanied By: friend Schedule Follow-up Appointment: Yes Clinical Summary of Care: Electronic Signature(s) Signed: 01/15/2021 5:20:05 PM By: 01/17/2021 RN, BSN Entered By: Barbara Lucero on 01/15/2021 15:12:48 -------------------------------------------------------------------------------- Patient/Caregiver Education Details Patient Name: Date of Service: 01/17/2021 11/22/2022andnbsp1:45 PM Medical Record Number: 01/17/2021 Patient Account Number: [id number] Date of Birth/Gender: Treating RN: 1940-07-10 (80 y.o. 72 Primary Care Physician: [name] Other Clinician: Referring Physician: Treating Physician/Extender: Barbara Lucero in Treatment: 20 Education Assessment Education Provided To: Patient Education Topics Provided Wound/Skin Impairment: Handouts: Caring for Your Ulcer Methods: Explain/Verbal Responses: Reinforcements needed Electronic Signature(s) Signed: 01/15/2021 5:20:05 PM By: 01/17/2021 RN, BSN Entered By: Barbara Lucero on 01/15/2021 15:12:34 -------------------------------------------------------------------------------- Wound Assessment Details Patient Name: Date of Service: 01/17/2021 Lucero. 01/15/2021 1:45 PM Medical Record Number: 01/17/2021 Patient Account Number: [id number] Date of Birth/Sex: Treating RN: Apr 25, 1940 (80 y.o. 72, [name] Primary Care Maevyn Riordan: Barbara Lucero Other Clinician: Referring Aphrodite Harpenau: Treating Azazel Franze/Extender: Barbara Lucero in Treatment: 20 Wound Status Wound Number: 4 Primary Venous Leg Ulcer Etiology: Wound Location: Left, Lateral Ankle Wound Open Wounding Event: Blister Status: Date Acquired: 11/26/2020 Comorbid Cataracts, Deep Vein Thrombosis,  Peripheral Venous Disease, Weeks Of Treatment: 6 History: Crohns, Osteoarthritis, Confinement Anxiety Clustered Wound: No Wound Measurements Length: (cm) 1.5 Width: (cm) 1.5 Depth: (cm) 0.1 Area: (cm) 1.767 Volume: (cm) 0.177 % Reduction in Area: -1025.5% % Reduction in Volume: -1006.2% Epithelialization: Medium (34-66%) Tunneling: No Undermining: No Wound Description Classification: Full Thickness Without Exposed Support Structures Wound Margin: Distinct, outline attached Exudate Amount: Medium Exudate Type: Purulent Exudate Color: yellow, brown, green Foul Odor After Cleansing: No Slough/Fibrino Yes Wound Bed Granulation  Amount: Large (67-100%) Exposed Structure Granulation Quality: Pink Fascia Exposed: No Necrotic Amount: Small (1-33%) Fat Layer (Subcutaneous Tissue) Exposed: Yes Necrotic Quality: Adherent Slough Tendon Exposed: No Muscle Exposed: No Joint Exposed: No Bone Exposed: No Treatment Notes Wound #4 (Ankle) Wound Laterality: Left, Lateral Cleanser Soap and Water Discharge Instruction: May shower and wash wound with dial antibacterial soap and water prior to dressing change. Wound Cleanser Discharge Instruction: Cleanse the wound with wound cleanser prior to applying a clean dressing using gauze sponges, not tissue or cotton balls. Peri-Wound Care Triamcinolone 15 (g) Discharge Instruction: Use triamcinolone 15 (g) mixed with lotion to lower leg Sween Lotion (Moisturizing lotion) Discharge Instruction: Apply moisturizing lotion as directed Topical Primary Dressing PolyMem Silver Non-Adhesive Dressing, 4.25x4.25 in Discharge Instruction: Apply to wound bed as instructed Secondary Dressing Woven Gauze Sponge, Non-Sterile 4x4 in Discharge Instruction: Apply over primary dressing as directed. ABD Pad, 8x10 Discharge Instruction: Apply over primary dressing as directed. Secured With Compression Wrap Kerlix Roll 4.5x3.1 (in/yd) Discharge Instruction:  Apply Kerlix and Coban compression as directed. Coban Self-Adherent Wrap 4x5 (in/yd) Discharge Instruction: Apply over Kerlix as directed. Compression Stockings Add-Ons Electronic Signature(s) Signed: 01/15/2021 5:20:05 PM By: Barbara Stall RN, BSN Entered By: Barbara Lucero on 01/15/2021 15:11:51 -------------------------------------------------------------------------------- Vitals Details Patient Name: Date of Service: Barbara Lucero. 01/15/2021 1:45 PM Medical Record Number: [id number] Patient Account Number: [id number] Date of Birth/Sex: Treating RN: [birth year] (80 y.o. Barbara Lucero, Barbara Lucero Primary Care Kristin Lamagna: Barbara Lucero Other Clinician: Referring Kahiau Lucero: Treating Taria Castrillo/Extender: Barbara Lucero in Treatment: 20 Vital Signs Time Taken: 13:50 Temperature (F): 97.8 Height (in): 63 Pulse (bpm): 83 Weight (lbs): 182 Respiratory Rate (breaths/min): 20 Body Mass Index (BMI): 32.2 Blood Pressure (mmHg): 155/76 Reference Range: 80 - 120 mg / dl Electronic Signature(s) Signed: 01/15/2021 5:20:05 PM By: Barbara Stall RN, BSN Entered By: Barbara Lucero on 01/15/2021 15:10:40

## 2021-01-15 NOTE — Progress Notes (Signed)
Barbara Lucero, Barbara Lucero ([id number]) Visit Report for 01/15/2021 SuperBill Details Patient Name: Date of Service: [name] 01/15/2021 Medical Record Number: [id number] Patient Account Number: [id number] Date of Birth/Sex: Treating RN: [birth year] (80 y.o. 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

## 2021-01-23 ENCOUNTER — Encounter (HOSPITAL_BASED_OUTPATIENT_CLINIC_OR_DEPARTMENT_OTHER): Payer: Medicare Other | Admitting: Internal Medicine

## 2021-01-23 ENCOUNTER — Other Ambulatory Visit: Payer: Self-pay

## 2021-01-23 DIAGNOSIS — L97322 Non-pressure chronic ulcer of left ankle with fat layer exposed: Secondary | ICD-10-CM | POA: Diagnosis not present

## 2021-01-23 DIAGNOSIS — Z86718 Personal history of other venous thrombosis and embolism: Secondary | ICD-10-CM | POA: Diagnosis not present

## 2021-01-23 DIAGNOSIS — I890 Lymphedema, not elsewhere classified: Secondary | ICD-10-CM | POA: Diagnosis not present

## 2021-01-23 DIAGNOSIS — I87331 Chronic venous hypertension (idiopathic) with ulcer and inflammation of right lower extremity: Secondary | ICD-10-CM | POA: Diagnosis not present

## 2021-01-23 DIAGNOSIS — L97821 Non-pressure chronic ulcer of other part of left lower leg limited to breakdown of skin: Secondary | ICD-10-CM | POA: Diagnosis not present

## 2021-01-23 DIAGNOSIS — L97921 Non-pressure chronic ulcer of unspecified part of left lower leg limited to breakdown of skin: Secondary | ICD-10-CM | POA: Diagnosis not present

## 2021-01-23 DIAGNOSIS — L97811 Non-pressure chronic ulcer of other part of right lower leg limited to breakdown of skin: Secondary | ICD-10-CM | POA: Diagnosis not present

## 2021-01-23 DIAGNOSIS — I739 Peripheral vascular disease, unspecified: Secondary | ICD-10-CM | POA: Diagnosis not present

## 2021-01-23 DIAGNOSIS — I87332 Chronic venous hypertension (idiopathic) with ulcer and inflammation of left lower extremity: Secondary | ICD-10-CM | POA: Diagnosis not present

## 2021-01-23 DIAGNOSIS — L03116 Cellulitis of left lower limb: Secondary | ICD-10-CM | POA: Diagnosis not present

## 2021-01-23 NOTE — Progress Notes (Addendum)
SHELBE, AMBORN ([id number]) Visit Report for 01/23/2021 HPI Details Patient Name: Date of Service: Barbara Lucero 01/23/2021 1:45 PM Medical Record Number: [id number] Patient Account Number: [id number] Date of Birth/Sex: Treating RN: 15-Jan-1941 (80 y.o. Barbara Lucero Primary Care Provider: Allyn Kenner Other Clinician: Referring Provider: Treating Provider/Extender: Adolm Joseph in Treatment: 21 History of Present Illness HPI Description: ADMISSION 08/23/2020 This is a 80 year old woman who arrives accompanied by her sister-in-law. 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 She has been dealing with chronic edema and discoloration of her legs for several years per her sister-in-law. I note that she saw Dr. Trula Slade on 05/21/2020 and he noted an extensive left leg DVT in 2019 although she is no longer on oral anticoagulants. He felt she had chronic venous insufficiency with skin changes related to this. He ordered her 20/30 stockings which were zippered stockings but I do not get the sense that the patient was all that compliant. In any case she started to develop weeping edema fluid recently coming out of the right lateral ankle serous drainage. She said it was pouring out last week. She saw her primary care provider on 08/08/2020 and was put on clindamycin which she is just finishing. She says the erythema feels better. Past medical history, aortic stenosis, chronic venous insufficiency, peripheral vascular disease, Crohn's disease which has been really recently quiescent, left hip fracture, DVT of the left leg in 2019. Notable that she has a very disabled son at home who she is the primary caregiver for Dr. Donzetta Matters quoted an ABI of 78.12 on the right with a TBI of 0.65 on the left and 0.93 and a TBI of 0.58. In our clinic today the ABI was 78.83 on the right and 0.81 on the left 7/7 the patient's wound on the right lateral ankle is healed. Her edema control is much  better. It turns out she does not have stockings but came in with Tubigrip on the left leg Readmission: 10/01/2020 on evaluation today patient appears to be doing decently well in regard to her leg. Unfortunately she does have an opening which happened as a result of her not wearing the compression socks she had something on the leg which was okay not necessarily the best but unfortunately did not include anything for the foot which is the main issue that we are finding here. With that being said I discussed with the patient that she really needs to have something for both and I think ordering her juxta lite compression wrap would be the ideal thing to do. 10/09/2020; patient I last saw in early July. At that point she had an area on the right lateral ankle which closed over. I am not really sure what happened however she is readmitted to the clinic last week. She had an area on her right lateral ankle this is closed however she has another area on the right lateral foot also quite a bit of discomfort on the right anterior foot. She has nothing open on the left leg. She comes in today with a single juxta lite stocking. She has a mid calf compression stocking she bought off [employer] on the left 10/16/2020; everything that we are concerned about on the right leg is healed including the right lateral ankle and right dorsal foot. She has another juxta light for the right leg, she had 1 on the left. The venous reflux studies I ordered last week are on Thursday. I will  see her back 1 more time to review these. She had already been seen by Dr. Myra Gianotti 9/6; back in to discuss her reflux studies. This showed reflux in the greater saphenous vein on the right. I am going to send her back for Dr. Myra Gianotti to review these. She is wearing her juxta lite stockings but only had 20/30 mmHg compression. She became concerned this morning with erythema on the right leg. She says she has an appoint with Dr. Myra Gianotti in about 2  weeks 12/04/2020; patient presents after being discharged 1 month ago from our clinic. She states that she has been wearing her compression stockings intermittently. She has open wounds to her lower extremities bilaterally limited to skin breakdown. She has increased redness to her right foot and increased weeping to the right ankle area. She has increased pain to the right lower extremity as well. She has not followed up with vein and vascular since discharge from our clinic last month as recommended. 10/18; I see the patient was readmitted last week for again small weeping areas on the right lateral and a larger superficial weeping area on the left lateral lower legs she has an appointment with Dr. Myra Gianotti to review the reflux studies to see if anything could be ablated from a superficial vein point of view. We are using 3 layer compression on 10/24; patient presents for follow-up. She states she saw vein and vascular today. She reports that they recommended compression stocking. She states that the 3 layer compression was uncomfortable and would like to go down to the Kerlix/Coban. She reports increased redness, Warmth and pain to the left lower extrem 11/1; the area on the right anterior lower leg is closed. She complains of a painful area on the left lateral lower leg just above the ankle. Once again there is erythema here. I put her on Augmentin last week empirically this is not had any effect. 11/8; the right anterior lower leg is remained closed. I gave her doxycycline for the area on the left lateral lower leg just above the ankle. Wounds are measuring somewhat smaller. Still marked erythema and tenderness although I have difficulty believing that this is cellulitis. We are putting her in 2 layer compression. She has a juxta lite stocking on the right leg I note that 3 weeks ago I said that she was seeing Dr. Myra Gianotti of follow-up for venous reflux although I do not have anything further on  this. This will need to be researched 11/15; right anterior lower leg remains closed. We have been using polymen to the left lateral ankle area. I put TCA to this area to help with the inflammation. I also gave her doxycycline although I had my doubts that this was cellulitis. 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 We have been using polymen on the left lateral ankle with TCA. She completed her doxycycline. As usual she comes in complaining of a lot of pain worse when she walks. She has been followed by Dr. Myra Gianotti although I do not see that she has had a recent appointment. She has been on a multitude of different antibiotics without real cultures. There is less erythema around the wound since last visit but it is measuring slightly larger Electronic Signature(s) Signed: 01/23/2021 4:53:46 PM By: Baltazar Najjar MD Entered By: Baltazar Najjar on 01/23/2021 15:34:40 -------------------------------------------------------------------------------- Physical Exam Details Patient Name: Date of Service: WRA Y, LO UISE B. 01/23/2021 1:45  PM Medical Record Number: [id number] Patient Account Number: [id number] Date of Birth/Sex: Treating RN: 06/16/1940 (80 y.o. Barbara Lucero Primary Care Provider: Allyn Kenner Other Clinician: Referring Provider: Treating Provider/Extender: Adolm Joseph in Treatment: 21 Constitutional Sitting or standing Blood Pressure is within target range for patient.. Pulse regular and within target range for patient.Marland Kitchen Respirations regular, non-labored and within target range.. Temperature is normal and within the target range for the patient.Marland Kitchen Appears in no distress. Cardiovascular Pedal pulses are palpable. Notes Wound exam; left lateral ankle. The wound is superficial but measuring larger. There is no erythema. She complains of severe pain but I do not see a lot to explain  that. She has severe venous insufficiency but no evidence of arterial insufficiency. She does not have a lot of edema but clearly severe venous hypertension Electronic Signature(s) Signed: 01/23/2021 4:53:46 PM By: Linton Ham MD Entered By: Linton Ham on 01/23/2021 15:37:06 -------------------------------------------------------------------------------- Physician Orders Details Patient Name: Date of Service: Barbara Cage B. 01/23/2021 1:45 PM Medical Record Number: [id number] Patient Account Number: [id number] Date of Birth/Sex: Treating RN: [birth year] (80 y.o. 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 - Dr. Dellia Nims Return A Bathing/ Shower/ Hygiene May shower with protection but do not get wound dressing(s) wet. Edema Control - Lymphedema / SCD / Other Bilateral Lower Extremities Elevate legs to the level of the heart or above for 30 minutes daily and/or when sitting, a frequency of: - throughout the day Avoid standing for long periods of time. Exercise regularly Compression stocking or Garment 20-30 mm/Hg pressure to: - right leg daily Home Health dmit to [place] for wound care. May utilize formulary equivalent dressing for wound treatment orders unless otherwise specified. - A Home health to change 2 x a week Wound Treatment Wound #4 - Ankle Wound Laterality: Left, Lateral Cleanser: Soap and Water (Home Health) 1 x Per Week/15  Days Discharge Instructions: May shower and wash wound with dial antibacterial soap and water prior to dressing change. Cleanser: Wound Cleanser (Home Health) 1 x Per Week/15 Days Discharge Instructions: Cleanse the wound with wound cleanser prior to applying a clean dressing using gauze sponges, not tissue or cotton balls. Peri-Wound Care: Triamcinolone 15 (g) (Home Health) 1 x Per Week/15 Days Discharge Instructions: Use triamcinolone 15 (g) mixed with lotion to lower leg Peri-Wound Care: Sween Lotion (Moisturizing lotion) (Home Health) 1 x Per Week/15 Days Discharge Instructions: Apply moisturizing lotion as directed Prim Dressing: Hydrofera Blue Classic Foam, 2x2 in (Home Health) 1 x Per Week/15 Days ary Discharge Instructions: Moisten with saline prior to applying to wound bed Secondary Dressing: Woven Gauze Sponge, Non-Sterile 4x4 in (Home Health) 1 x Per Week/15 Days Discharge Instructions: Apply over primary dressing as directed. Secondary Dressing: ABD Pad, 8x10 (Home Health) 1 x Per Week/15 Days Discharge Instructions: Apply over primary dressing as directed. Compression Wrap: Kerlix Roll 4.5x3.1 (in/yd) (Home Health) 1 x Per Week/15 Days Discharge Instructions: Apply Kerlix and Coban compression as directed. Compression Wrap: Coban Self-Adherent Wrap 4x5 (in/yd) (Home Health) 1 x Per Week/15 Days Discharge  Instructions: Apply over Kerlix as directed. Laboratory naerobe culture (MICRO) Bacteria identified in Unspecified specimen by A LOINC Code: [id number] Convenience Name: Anerobic culture Electronic Signature(s) Signed: 01/23/2021 4:53:46 PM By: Linton Ham MD Signed: 01/23/2021 5:36:01 PM By: Rhae Hammock RN Entered By: Rhae Hammock on 01/23/2021 15:26:52 -------------------------------------------------------------------------------- Problem List Details Patient Name: Date of Service: Barbara Cage B. 01/23/2021 1:45 PM Medical Record Number:  [id number] Patient Account Number: [id number] Date of Birth/Sex: Treating RN: [birth year] (80 y.o. Barbara Lucero Primary Care Provider: Allyn Kenner Other Clinician: Referring Provider: Treating Provider/Extender: Adolm Joseph in Treatment: 21 Active Problems ICD-10 Encounter Code Description Active Date MDM Diagnosis I87.332 Chronic venous hypertension (idiopathic) with ulcer and inflammation of left 12/04/2020 No Yes lower extremity L03.116 Cellulitis of left lower limb 12/25/2020 No Yes L97.921 Non-pressure chronic ulcer of unspecified part of left lower leg limited to 12/04/2020 No Yes breakdown of skin I89.0 Lymphedema, not elsewhere classified 08/23/2020 No Yes Inactive Problems ICD-10 Code Description Active Date Inactive Date I87.331 Chronic venous hypertension (idiopathic) with ulcer and inflammation of right lower 08/23/2020 08/23/2020 extremity L97.811 Non-pressure chronic ulcer of other part of right lower leg limited to breakdown of skin 08/23/2020 08/23/2020 L03.115 Cellulitis of right lower limb 10/09/2020 10/09/2020 Resolved Problems Electronic Signature(s) Signed: 01/23/2021 4:53:46 PM By: Linton Ham MD Entered By: Linton Ham on 01/23/2021 15:32:34 -------------------------------------------------------------------------------- Progress Note Details Patient Name: Date of Service: Barbara Cage B. 01/23/2021 1:45 PM Medical Record Number: [id number] Patient Account Number: [id number] Date of Birth/Sex: Treating RN: 1940/07/17 (79 y.o. Barbara Lucero Primary Care Provider: Allyn Kenner Other Clinician: Referring Provider: Treating Provider/Extender: Adolm Joseph in Treatment: 21 Subjective History of Present Illness (HPI) ADMISSION 08/23/2020 This is a 80 year old woman who arrives accompanied by her sister-in-law. 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 She has been dealing with chronic edema and  discoloration of her legs for several years per her sister-in-law. I note that she saw Dr. Trula Slade on 05/21/2020 and he noted an extensive left leg DVT in 2019 although she is no longer on oral anticoagulants. He felt she had chronic venous insufficiency with skin changes related to this. He ordered her 20/30 stockings which were zippered stockings but I do not get the sense that the patient was all that compliant. In any case she started to develop weeping edema fluid recently coming out of the right lateral ankle serous drainage. She said it was pouring out last week. She saw her primary care provider on 08/08/2020 and was put on clindamycin which she is just finishing. She says the erythema feels better. Past medical history, aortic stenosis, chronic venous insufficiency, peripheral vascular disease, Crohn's disease which has been really recently quiescent, left hip fracture, DVT of the left leg in 2019. Notable that she has a very disabled son at home who she is the primary caregiver for Dr. Donzetta Matters quoted an ABI of 78.12 on the right with a TBI of 0.65 on the left and 0.93 and a TBI of 0.58. In our clinic today the ABI was 78.83 on the right and 0.81 on the left 7/7 the patient's wound on the right lateral ankle is healed. Her edema control is much better. It turns out she does not have stockings but came in with Tubigrip on the left leg Readmission: 10/01/2020 on evaluation today patient appears to be doing decently well in regard to her leg. Unfortunately she does have an opening which happened  as a result of her not wearing the compression socks she had something on the leg which was okay not necessarily the best but unfortunately did not include anything for the foot which is the main issue that we are finding here. With that being said I discussed with the patient that she really needs to have something for both and I think ordering her juxta lite compression wrap would be the ideal thing to  do. 10/09/2020; patient I last saw in early July. At that point she had an area on the right lateral ankle which closed over. I am not really sure what happened however she is readmitted to the clinic last week. She had an area on her right lateral ankle this is closed however she has another area on the right lateral foot also quite a bit of discomfort on the right anterior foot. She has nothing open on the left leg. She comes in today with a single juxta lite stocking. She has a mid calf compression stocking she bought off [place] on the left 10/16/2020; everything that we are concerned about on the right leg is healed including the right lateral ankle and right dorsal foot. She has another juxta light for the right leg, she had 1 on the left. The venous reflux studies I ordered last week are on Thursday. I will see her back 1 more time to review these. She had already been seen by Dr. Trula Slade 9/6; back in to discuss her reflux studies. This showed reflux in the greater saphenous vein on the right. I am going to send her back for Dr. Trula Slade to review these. She is wearing her juxta lite stockings but only had 20/30 mmHg compression. She became concerned this morning with erythema on the right leg. She says she has an appoint with Dr. Trula Slade in about 2 weeks 12/04/2020; patient presents after being discharged 1 month ago from our clinic. She states that she has been wearing her compression stockings intermittently. She has open wounds to her lower extremities bilaterally limited to skin breakdown. She has increased redness to her right foot and increased weeping to the right ankle area. She has increased pain to the right lower extremity as well. She has not followed up with vein and vascular since discharge from our clinic last month as recommended. 10/18; I see the patient was readmitted last week for again small weeping areas on the right lateral and a larger superficial weeping area on the left  lateral lower legs she has an appointment with Dr. Trula Slade to review the reflux studies to see if anything could be ablated from a superficial vein point of view. We are using 3 layer compression on 10/24; patient presents for follow-up. She states she saw vein and vascular today. She reports that they recommended compression stocking. She states that the 3 layer compression was uncomfortable and would like to go down to the Kerlix/Coban. She reports increased redness, Warmth and pain to the left lower extrem 11/1; the area on the right anterior lower leg is closed. She complains of a painful area on the left lateral lower leg just above the ankle. Once again there is erythema here. I put her on Augmentin last week empirically this is not had any effect. 11/8; the right anterior lower leg is remained closed. I gave her doxycycline for the area on the left lateral lower leg just above the ankle. Wounds are measuring somewhat smaller. Still marked erythema and tenderness although I have difficulty believing  that this is cellulitis. We are putting her in 2 layer compression. She has a juxta lite stocking on the right leg I note that 3 weeks ago I said that she was seeing Dr. Myra Gianotti of follow-up for venous reflux although I do not have anything further on this. This will need to be researched 11/15; right anterior lower leg remains closed. We have been using polymen to the left lateral ankle area. I put TCA to this area to help with the inflammation. I also gave her doxycycline although I had my doubts that this was cellulitis. 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 We have been using polymen on the left lateral ankle with TCA. She completed her doxycycline. As usual she comes in complaining of a lot of pain worse when she walks. She has been followed by Dr. Myra Gianotti although I do not see that she has had a  recent appointment. She has been on a multitude of different antibiotics without real cultures. There is less erythema around the wound since last visit but it is measuring slightly larger Objective Constitutional Sitting or standing Blood Pressure is within target range for patient.. Pulse regular and within target range for patient.Marland Kitchen Respirations regular, non-labored and within target range.. Temperature is normal and within the target range for the patient.Marland Kitchen Appears in no distress. Vitals Time Taken: 2:18 PM, Height: 63 in, Weight: 182 lbs, BMI: 32.2, Temperature: 97.9 F, Pulse: 76 bpm, Respiratory Rate: 16 breaths/min, Blood Pressure: 128/77 mmHg. Cardiovascular Pedal pulses are palpable. General Notes: Wound exam; left lateral ankle. The wound is superficial but measuring larger. There is no erythema. She complains of severe pain but I do not see a lot to explain that. She has severe venous insufficiency but no evidence of arterial insufficiency. She does not have a lot of edema but clearly severe venous hypertension Integumentary (Hair, Skin) Wound #4 status is Open. Original cause of wound was Blister. The date acquired was: 11/26/2020. The wound has been in treatment 7 weeks. The wound is located on the Left,Lateral Ankle. The wound measures 4.5cm length x 2.4cm width x 0.1cm depth; 8.482cm^2 area and 0.848cm^3 volume. There is Fat Layer (Subcutaneous Tissue) exposed. There is no tunneling or undermining noted. There is a medium amount of purulent drainage noted. The wound margin is distinct with the outline attached to the wound base. There is large (67-100%) pink granulation within the wound bed. There is a small (1-33%) amount of necrotic tissue within the wound bed including Adherent Slough. Assessment Active Problems ICD-10 Chronic venous hypertension (idiopathic) with ulcer and inflammation of left lower extremity Cellulitis of left lower limb Non-pressure chronic ulcer of  unspecified part of left lower leg limited to breakdown of skin Lymphedema, not elsewhere classified Plan Follow-up Appointments: Return Appointment in 1 week. - Dr. Leanord Hawking Bathing/ Shower/ Hygiene: May shower with protection but do not get wound dressing(s) wet. Edema Control - Lymphedema / SCD / Other: Elevate legs to the level of the heart or above for 30 minutes daily and/or when sitting, a frequency of: - throughout the day Avoid standing for long periods of time. Exercise regularly Compression stocking or Garment 20-30 mm/Hg pressure to: - right leg daily Home Health: Admit to Home Health for wound care. May utilize formulary equivalent dressing for wound treatment orders unless otherwise specified. - Home health to change 2 x a week Laboratory ordered were: Anerobic culture WOUND #4: -  Ankle Wound Laterality: Left, Lateral Cleanser: Soap and Water (Home Health) 1 x Per Week/15 Days Discharge Instructions: May shower and wash wound with dial antibacterial soap and water prior to dressing change. Cleanser: Wound Cleanser (Home Health) 1 x Per Week/15 Days Discharge Instructions: Cleanse the wound with wound cleanser prior to applying a clean dressing using gauze sponges, not tissue or cotton balls. Peri-Wound Care: Triamcinolone 15 (g) (Home Health) 1 x Per Week/15 Days Discharge Instructions: Use triamcinolone 15 (g) mixed with lotion to lower leg Peri-Wound Care: Sween Lotion (Moisturizing lotion) (Home Health) 1 x Per Week/15 Days Discharge Instructions: Apply moisturizing lotion as directed Prim Dressing: Hydrofera Blue Classic Foam, 2x2 in (Home Health) 1 x Per Week/15 Days ary Discharge Instructions: Moisten with saline prior to applying to wound bed Secondary Dressing: Woven Gauze Sponge, Non-Sterile 4x4 in (Home Health) 1 x Per Week/15 Days Discharge Instructions: Apply over primary dressing as directed. Secondary Dressing: ABD Pad, 8x10 (Home Health) 1 x Per Week/15  Days Discharge Instructions: Apply over primary dressing as directed. Com pression Wrap: Kerlix Roll 4.5x3.1 (in/yd) (Home Health) 1 x Per Week/15 Days Discharge Instructions: Apply Kerlix and Coban compression as directed. Com pression Wrap: Coban Self-Adherent Wrap 4x5 (in/yd) (Home Health) 1 x Per Week/15 Days Discharge Instructions: Apply over Kerlix as directed. #1 we use MolecuLight on this did not show any significant fluorescence 2. I used a #3 curette to obtain a deep tissue culture 3. Change to Hydrofera Blue kerlix Coban. 4. We will try to get her advanced home care. Apparently they come into the home to address problems with her son. 5. No antibiotics today. I do not see a good reason to do so. I will await for the deep tissue culture before I do anything orally or topically MolecuLight DX: 1st Scanned Wound Fluorescence bacterial imaging was medically necessary today due to Initial Evaluation of the wound with MolecuLightDX to determine baseline (Indication): bacterial bioburden level There was no fluorescence suggestive of a high bacterial load on todays MolecuLight Results scan. MolecuLight Procedure The MolecularLight DX device was cleaned with a disinfectant wipe prior to use., The correct patient profile was confirmed and correct wound was verified., Range finder sensor used to ensure appropriate distance selected The following was completed: between imaging unit and wound bed, Room lights were turned off and the ambient light sensor was checked., Blue circle appeared around the lightbulb., The fluorescence icon was selected. Screen was tapped to enhance focus and the image was captured. Additional Scanned Wounds Did you scan any additional Woundso No Electronic Signature(s) Signed: 01/24/2021 4:34:24 PM By: Linton Ham MD Previous Signature: 01/23/2021 4:53:46 PM Version By: Linton Ham MD Entered By: Linton Ham on 01/24/2021  08:11:30 -------------------------------------------------------------------------------- SuperBill Details Patient Name: Date of Service: Barbara Lucero 01/23/2021 Medical Record Number: [id number] Patient Account Number: [id number] Date of Birth/Sex: Treating RN: 1940/07/15 (80 y.o. 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 I89.0 Lymphedema, not elsewhere classified Facility Procedures CPT4 Code: [id number] Description: [id number] - WOUND CARE VISIT-LEV 3 EST PT Modifier: Quantity: 1 CPT4 Code: [id number] Description: [id number] NONCNTACT RT FLORO WND 1ST STE ICD-10 Diagnosis Description L97.921 Non-pressure chronic ulcer  of unspecified part of left lower leg limited to break Modifier: down of skin Quantity: 1 Physician Procedures : CPT4 Code Description Modifier [id number] - WC PHYS LEVEL 4 - EST PT 25 ICD-10 Diagnosis Description I87.332 Chronic venous hypertension (idiopathic) with ulcer and inflammation of left lower extremity L97.921 Non-pressure chronic ulcer of  unspecified part of left lower leg limited to breakdown of skin I89.0 Lymphedema, not elsewhere classified Quantity: 1 : [id number] NONCNTACT RT FLORO WND 1ST STE ICD-10 Diagnosis Description 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

## 2021-01-28 DIAGNOSIS — I350 Nonrheumatic aortic (valve) stenosis: Secondary | ICD-10-CM | POA: Diagnosis not present

## 2021-01-28 DIAGNOSIS — I739 Peripheral vascular disease, unspecified: Secondary | ICD-10-CM | POA: Diagnosis not present

## 2021-01-28 DIAGNOSIS — K5090 Crohn's disease, unspecified, without complications: Secondary | ICD-10-CM | POA: Diagnosis not present

## 2021-01-28 DIAGNOSIS — Z7982 Long term (current) use of aspirin: Secondary | ICD-10-CM | POA: Diagnosis not present

## 2021-01-28 DIAGNOSIS — L03116 Cellulitis of left lower limb: Secondary | ICD-10-CM | POA: Diagnosis not present

## 2021-01-28 DIAGNOSIS — I890 Lymphedema, not elsewhere classified: Secondary | ICD-10-CM | POA: Diagnosis not present

## 2021-01-28 DIAGNOSIS — Z86718 Personal history of other venous thrombosis and embolism: Secondary | ICD-10-CM | POA: Diagnosis not present

## 2021-01-28 DIAGNOSIS — I10 Essential (primary) hypertension: Secondary | ICD-10-CM | POA: Diagnosis not present

## 2021-01-28 DIAGNOSIS — L97821 Non-pressure chronic ulcer of other part of left lower leg limited to breakdown of skin: Secondary | ICD-10-CM | POA: Diagnosis not present

## 2021-01-28 DIAGNOSIS — I87332 Chronic venous hypertension (idiopathic) with ulcer and inflammation of left lower extremity: Secondary | ICD-10-CM | POA: Diagnosis not present

## 2021-01-28 DIAGNOSIS — Z8781 Personal history of (healed) traumatic fracture: Secondary | ICD-10-CM | POA: Diagnosis not present

## 2021-01-28 NOTE — Progress Notes (Signed)
Barbara Lucero, Barbara Lucero ([id number]) Visit Report for 01/23/2021 Arrival Information Details Patient Name: Date of Service: Barbara Lucero 01/23/2021 1:45 PM Medical Record Number: [id number] Patient Account Number: [id number] Date of Birth/Sex: Treating RN: 03/11/1940 (80 y.o. Barbara Lucero Primary Care Atticus Lemberger: [name] Other Clinician: Referring Tanda Morrissey: Treating Alyx Mcguirk/Extender: [name] in Treatment: 21 Visit Information History Since Last Visit Added or deleted any medications: No Patient Arrived: Barbara Lucero Any new allergies or adverse reactions: No Arrival Time: 14:18 Had a fall or experienced change in No Accompanied By: sister activities of daily living that may affect Transfer Assistance: None risk of falls: Patient Identification Verified: Yes Signs or symptoms of abuse/neglect since last visito No Secondary Verification Process Completed: Yes Hospitalized since last visit: No Patient Requires Transmission-Based Precautions: No Implantable device outside of the clinic excluding No Patient Has Alerts: No cellular tissue based products placed in the center since last visit: Has Dressing in Place as Prescribed: Yes Has Compression in Place as Prescribed: Yes Pain Present Now: Yes Electronic Signature(s) Signed: 01/23/2021 5:07:48 PM By: Zandra Abts RN, BSN Entered By: Zandra Abts on 01/23/2021 14:19:30 -------------------------------------------------------------------------------- Clinic Level of Care Assessment Details Patient Name: Date of Service: Barbara Lucero 01/23/2021 1:45 PM Medical Record Number: [id number] Patient Account Number: [id number] Date of Birth/Sex: Treating RN: 23-May-1940 (80 y.o. Barbara Lucero, Barbara Lucero Primary Care Heddy Vidana: [name] Other Clinician: Referring Genisis Sonnier: Treating Hortense Cantrall/Extender: [name] in Treatment: 21 Clinic Level of Care Assessment Items TOOL 4 Quantity  Score X- 1 0 Use when only an EandM is performed on FOLLOW-UP visit ASSESSMENTS - Nursing Assessment / Reassessment X- 1 10 Reassessment of Co-morbidities (includes updates in patient status) X- 1 5 Reassessment of Adherence to Treatment Plan ASSESSMENTS - Wound and Skin A ssessment / Reassessment X - Simple Wound Assessment / Reassessment - one wound 1 5 []  - 0 Complex Wound Assessment / Reassessment - multiple wounds []  - 0 Dermatologic / Skin Assessment (not related to wound area) ASSESSMENTS - Focused Assessment []  - 0 Circumferential Edema Measurements - multi extremities []  - 0 Nutritional Assessment / Counseling / Intervention []  - 0 Lower Extremity Assessment (monofilament, tuning fork, pulses) []  - 0 Peripheral Arterial Disease Assessment (using hand held doppler) ASSESSMENTS - Ostomy and/or Continence Assessment and Care []  - 0 Incontinence Assessment and Management []  - 0 Ostomy Care Assessment and Management (repouching, etc.) PROCESS - Coordination of Care X - Simple Patient / Family Education for ongoing care 1 15 []  - 0 Complex (extensive) Patient / Family Education for ongoing care X- 1 10 Staff obtains , Records, T Results / Process Orders est X- 1 10 Staff telephones HHA, Nursing Homes / Clarify orders / etc []  - 0 Routine Transfer to another Facility (non-emergent condition) []  - 0 Routine Hospital Admission (non-emergent condition) []  - 0 New Admissions / / Ordering NPWT Apligraf, etc. , []  - 0 Emergency Hospital Admission (emergent condition) X- 1 10 Simple Discharge Coordination []  - 0 Complex (extensive) Discharge Coordination PROCESS - Special Needs []  - 0 Pediatric / Minor Patient Management []  - 0 Isolation Patient Management []  - 0 Hearing / Language / Visual special needs []  - 0 Assessment of Community assistance (transportation, D/C planning, etc.) []  - 0 Additional assistance / Altered  mentation []  - 0 Support Surface(s) Assessment (bed, cushion, seat, etc.) INTERVENTIONS - Wound Cleansing / Measurement X - Simple Wound Cleansing - one wound 1 5 []  -  0 Complex Wound Cleansing - multiple wounds X- 1 5 Wound Imaging (photographs - any number of wounds) []  - 0 Wound Tracing (instead of photographs) X- 1 5 Simple Wound Measurement - one wound []  - 0 Complex Wound Measurement - multiple wounds INTERVENTIONS - Wound Dressings X - Small Wound Dressing one or multiple wounds 1 10 []  - 0 Medium Wound Dressing one or multiple wounds []  - 0 Large Wound Dressing one or multiple wounds X- 1 5 Application of Medications - topical []  - 0 Application of Medications - injection INTERVENTIONS - Miscellaneous []  - 0 External ear exam X- 1 5 Specimen Collection (cultures, biopsies, blood, body fluids, etc.) X- 1 5 Specimen(s) / Culture(s) sent or taken to Lab for analysis []  - 0 Patient Transfer (multiple staff / Lift / Similar devices) []  - 0 Simple Staple / Suture removal (25 or less) []  - 0 Complex Staple / Suture removal (26 or more) []  - 0 Hypo / Hyperglycemic Management (close monitor of Blood Glucose) []  - 0 Ankle / Brachial Index (ABI) - do not check if billed separately X- 1 5 Vital Signs Has the patient been seen at the hospital within the last three years: Yes Total Score: 110 Level Of Care: New/Established - Level 3 Electronic Signature(s) Signed: 01/23/2021 5:36:01 PM By: RN Entered By: on 01/23/2021 15:28:46 -------------------------------------------------------------------------------- Encounter Discharge Information Details Patient Name: Date of Service: B. 01/23/2021 1:45 PM Medical Record Number: Michiel Sites Patient Account Number: Date of Birth/Sex: Treating RN: 29-Jun-1940 (80 y.o. , Barbara Lucero Primary Care Lakiyah Arntson: 01/25/2021 Other Clinician: Referring  Suede Greenawalt: Treating Skyllar Notarianni/Extender: Fonnie Mu in Treatment: 21 Encounter Discharge Information Items Discharge Condition: Stable Ambulatory Status: Walker Discharge Destination: Home Transportation: Private Auto Accompanied By: family Schedule Follow-up Appointment: Yes Clinical Summary of Care: Patient Declined Electronic Signature(s) Signed: 01/23/2021 5:36:01 PM By: 01/25/2021 RN Entered By: Barbara Sheets on 01/23/2021 15:31:56 -------------------------------------------------------------------------------- Lower Extremity Assessment Details Patient Name: Date of Service: [id number] 01/23/2021 1:45 PM Medical Record Number: 08/27/1940 Patient Account Number: [id number] Date of Birth/Sex: Treating RN: 1940-02-27 (80 y.o. [name] Primary Care Bralin Garry: 01/25/2021 Other Clinician: Referring Breanne Olvera: Treating Autumn Pruitt/Extender: Fonnie Mu in Treatment: 21 Edema Assessment Assessed: Fonnie Mu: No] 01/25/2021: No] Edema: [Left: Ye] [Right: s] Calf Left: Right: Point of Measurement: 29 cm From Medial Instep 35 cm Ankle Left: Right: Point of Measurement: 10 cm From Medial Instep 21 cm Vascular Assessment Pulses: Dorsalis Pedis Palpable: [Left:Yes] Electronic Signature(s) Signed: 01/23/2021 5:07:48 PM By: 01/25/2021 RN, BSN Entered By: [id number] on 01/23/2021 14:26:36 -------------------------------------------------------------------------------- Multi Wound Chart Details Patient Name: Date of Service: 08/27/1940 B. 01/23/2021 1:45 PM Medical Record Number: Barbara Lucero Patient Account Number: [name] Date of Birth/Sex: Treating RN: Dec 07, 1940 (80 y.o. [name] Primary Care Tiernan Suto: 01/25/2021 Other Clinician: Referring Raylea Adcox: Treating Marbeth Smedley/Extender: Zandra Abts in Treatment: 21 Vital Signs Height(in): 63 Pulse(bpm): 76 Weight(lbs): 182 Blood  Pressure(mmHg): 128/77 Body Mass Index(BMI): 32 Temperature(F): 97.9 Respiratory Rate(breaths/min): 16 Photos: [N/A:N/A] Left, Lateral Ankle N/A N/A Wound Location: Blister N/A N/A Wounding Event: Venous Leg Ulcer N/A N/A Primary Etiology: Cataracts, Deep Vein Thrombosis, N/A N/A Comorbid History: Peripheral Venous Disease, Crohns, Osteoarthritis, Confinement Anxiety 11/26/2020 N/A N/A Date Acquired: 7 N/A N/A Weeks of Treatment: Open N/A N/A Wound Status: 4.5x2.4x0.1 N/A N/A Measurements L x W x D (cm) 8.482 N/A N/A  A (cm) : rea 0.848 N/A N/A Volume (cm) : -5302.50% N/A N/A % Reduction in Area: -5200.00% N/A N/A % Reduction in Volume: Full Thickness Without Exposed N/A N/A Classification: Support Structures Medium N/A N/A Exudate Amount: Purulent N/A N/A Exudate Type: yellow, brown, green N/A N/A Exudate Color: Distinct, outline attached N/A N/A Wound Margin: Large (67-100%) N/A N/A Granulation Amount: Pink N/A N/A Granulation Quality: Small (1-33%) N/A N/A Necrotic Amount: Fat Layer (Subcutaneous Tissue): Yes N/A N/A Exposed Structures: Fascia: No Tendon: No Muscle: No Joint: No Bone: No Medium (34-66%) N/A N/A Epithelialization: Treatment Notes Wound #4 (Ankle) Wound Laterality: Left, Lateral Cleanser Soap and Water Discharge Instruction: May shower and wash wound with dial antibacterial soap and water prior to dressing change. Wound Cleanser Discharge Instruction: Cleanse the wound with wound cleanser prior to applying a clean dressing using gauze sponges, not tissue or cotton balls. Peri-Wound Care Triamcinolone 15 (g) Discharge Instruction: Use triamcinolone 15 (g) mixed with lotion to lower leg Sween Lotion (Moisturizing lotion) Discharge Instruction: Apply moisturizing lotion as directed Topical Primary Dressing Hydrofera Blue Classic Foam, 2x2 in Discharge Instruction: Moisten with saline prior to applying to wound bed Secondary  Dressing Woven Gauze Sponge, Non-Sterile 4x4 in Discharge Instruction: Apply over primary dressing as directed. ABD Pad, 8x10 Discharge Instruction: Apply over primary dressing as directed. Secured With Compression Wrap Kerlix Roll 4.5x3.1 (in/yd) Discharge Instruction: Apply Kerlix and Coban compression as directed. Coban Self-Adherent Wrap 4x5 (in/yd) Discharge Instruction: Apply over Kerlix as directed. Compression Stockings Add-Ons Electronic Signature(s) Signed: 01/23/2021 4:53:46 PM By: Baltazar Najjar MD Signed: 01/23/2021 5:07:48 PM By: Zandra Abts RN, BSN Entered By: Baltazar Najjar on 01/23/2021 15:32:42 -------------------------------------------------------------------------------- Multi-Disciplinary Care Plan Details Patient Name: Date of Service: Barbara Lucero 01/23/2021 1:45 PM Medical Record Number: [id number] Patient Account Number: [id number] Date of Birth/Sex: Treating RN: 01-Oct-1940 (80 y.o. Barbara Lucero, Barbara Lucero Primary Care Angas Isabell: [name] Other Clinician: Referring Qualyn Oyervides: Treating Romyn Boswell/Extender: [name] in Treatment: 21 Multidisciplinary Care Plan reviewed with physician Active Inactive Venous Leg Ulcer Nursing Diagnoses: Potential for venous Insuffiency (use before diagnosis confirmed) Goals: Patient will maintain optimal edema control Date Initiated: 08/23/2020 Target Resolution Date: 02/07/2021 Goal Status: Active Interventions: Assess peripheral edema status every visit. Provide education on venous insufficiency Treatment Activities: Non-invasive vascular studies : 08/23/2020 T ordered outside of clinic : 08/23/2020 est Therapeutic compression applied : 08/23/2020 Notes: Wound/Skin Impairment Nursing Diagnoses: Knowledge deficit related to ulceration/compromised skin integrity Goals: Patient/caregiver will verbalize understanding of skin care regimen Date Initiated: 08/23/2020 Target Resolution  Date: 02/07/2021 Goal Status: Active Ulcer/skin breakdown will have a volume reduction of 30% by week 4 Date Initiated: 10/01/2020 Date Inactivated: 12/17/2020 Target Resolution Date: 01/08/2021 Unmet Reason: see wound Goal Status: Unmet measurements. Interventions: Assess patient/caregiver ability to obtain necessary supplies Assess patient/caregiver ability to perform ulcer/skin care regimen upon admission and as needed Provide education on ulcer and skin care Treatment Activities: Skin care regimen initiated : 08/23/2020 Topical wound management initiated : 08/23/2020 Notes: Electronic Signature(s) Signed: 01/23/2021 5:36:01 PM By: Fonnie Mu RN Entered By: Fonnie Mu on 01/23/2021 15:28:10 -------------------------------------------------------------------------------- Pain Assessment Details Patient Name: Date of Service: Barbara Sheets B. 01/23/2021 1:45 PM Medical Record Number: [id number] Patient Account Number: [id number] Date of Birth/Sex: Treating RN: Jan 24, 1941 (80 y.o. Barbara Lucero Primary Care Kaelei Wheeler: [name] Other Clinician: Referring Mayrani Khamis: Treating Amrita Radu/Extender: [name] in Treatment: 21 Active Problems Location of Pain Severity and Description of Pain Patient  Has Paino Yes Site Locations Pain Location: Pain Location: Pain in Ulcers With Dressing Change: Yes Duration of the Pain. Constant / Intermittento Intermittent Rate the pain. Current Pain Level: 3 Character of Pain Describe the Pain: Throbbing Pain Management and Medication Current Pain Management: Medication: Yes Cold Application: No Rest: No Massage: No Activity: No T.E.N.S.: No Heat Application: No Leg drop or elevation: No Is the Current Pain Management Adequate: Adequate How does your wound impact your activities of daily livingo Sleep: No Bathing: No Appetite: No Relationship With Others: No Bladder Continence: No Emotions:  No Bowel Continence: No Work: No Toileting: No Drive: No Dressing: No Hobbies: No Psychologist, prison and probation services) Signed: 01/23/2021 5:07:48 PM By: Zandra Abts RN, BSN Entered By: Zandra Abts on 01/23/2021 14:20:02 -------------------------------------------------------------------------------- Patient/Caregiver Education Details Patient Name: Date of Service: Barbara Lucero 11/30/2022andnbsp1:45 PM Medical Record Number: [id number] Patient Account Number: [id number] Date of Birth/Gender: Treating RN: [birth year] (80 y.o. Barbara Lucero, Barbara Lucero Primary Care Physician: [name] Other Clinician: Referring Physician: Treating Physician/Extender: [name] in Treatment: 21 Education Assessment Education Provided To: Patient Education Topics Provided Venous: Methods: Explain/Verbal Responses: Reinforcements needed Electronic Signature(s) Signed: 01/23/2021 5:36:01 PM By: Fonnie Mu RN Entered By: Fonnie Mu on 01/23/2021 15:14:31 -------------------------------------------------------------------------------- Wound Assessment Details Patient Name: Date of Service: Barbara Sheets B. 01/23/2021 1:45 PM Medical Record Number: [id number] Patient Account Number: [id number] Date of Birth/Sex: Treating RN: [birth year] (80 y.o. Barbara Lucero Primary Care Walther Sanagustin: [name] Other Clinician: Referring Ashli Selders: Treating Lanea Vankirk/Extender: [name] in Treatment: 21 Wound Status Wound Number: 4 Primary Venous Leg Ulcer Etiology: Wound Location: Left, Lateral Ankle Wound Open Wounding Event: Blister Status: Date Acquired: 11/26/2020 Comorbid Cataracts, Deep Vein Thrombosis, Peripheral Venous Disease, Weeks Of Treatment: 7 History: Crohns, Osteoarthritis, Confinement Anxiety Clustered Wound: No Photos Wound Measurements Length: (cm) 4.5 Width: (cm) 2.4 Depth: (cm) 0.1 Area: (cm) 8.482 Volume: (cm) 0.848 %  Reduction in Area: -5302.5% % Reduction in Volume: -5200% Epithelialization: Medium (34-66%) Tunneling: No Undermining: No Wound Description Classification: Full Thickness Without Exposed Support Structures Wound Margin: Distinct, outline attached Exudate Amount: Medium Exudate Type: Purulent Exudate Color: yellow, brown, green Foul Odor After Cleansing: No Slough/Fibrino Yes Wound Bed Granulation Amount: Large (67-100%) Exposed Structure Granulation Quality: Pink Fascia Exposed: No Necrotic Amount: Small (1-33%) Fat Layer (Subcutaneous Tissue) Exposed: Yes Necrotic Quality: Adherent Slough Tendon Exposed: No Muscle Exposed: No Joint Exposed: No Bone Exposed: No Treatment Notes Wound #4 (Ankle) Wound Laterality: Left, Lateral Cleanser Soap and Water Discharge Instruction: May shower and wash wound with dial antibacterial soap and water prior to dressing change. Wound Cleanser Discharge Instruction: Cleanse the wound with wound cleanser prior to applying a clean dressing using gauze sponges, not tissue or cotton balls. Peri-Wound Care Triamcinolone 15 (g) Discharge Instruction: Use triamcinolone 15 (g) mixed with lotion to lower leg Sween Lotion (Moisturizing lotion) Discharge Instruction: Apply moisturizing lotion as directed Topical Primary Dressing Hydrofera Blue Classic Foam, 2x2 in Discharge Instruction: Moisten with saline prior to applying to wound bed Secondary Dressing Woven Gauze Sponge, Non-Sterile 4x4 in Discharge Instruction: Apply over primary dressing as directed. ABD Pad, 8x10 Discharge Instruction: Apply over primary dressing as directed. Secured With Compression Wrap Kerlix Roll 4.5x3.1 (in/yd) Discharge Instruction: Apply Kerlix and Coban compression as directed. Coban Self-Adherent Wrap 4x5 (in/yd) Discharge Instruction: Apply over Kerlix as directed. Compression Stockings Add-Ons Electronic Signature(s) Signed: 01/23/2021 5:36:01 PM By:  Fonnie Mu RN Signed: 01/28/2021 3:59:15 PM By:  Zandra Abts RN, BSN Previous Signature: 01/23/2021 5:07:48 PM Version By: Zandra Abts RN, BSN Entered By: Fonnie Mu on 01/23/2021 17:30:54 -------------------------------------------------------------------------------- Vitals Details Patient Name: Date of Service: Barbara Sheets B. 01/23/2021 1:45 PM Medical Record Number: [id number] Patient Account Number: [id number] Date of Birth/Sex: Treating RN: 08-23-1940 (80 y.o. Barbara Lucero Primary Care Jorden Minchey: [name] Other Clinician: Referring Madline Oesterling: Treating Shareen Capwell/Extender: [name] in Treatment: 21 Vital Signs Time Taken: 14:18 Temperature (F): 97.9 Height (in): 63 Pulse (bpm): 76 Weight (lbs): 182 Respiratory Rate (breaths/min): 16 Body Mass Index (BMI): 32.2 Blood Pressure (mmHg): 128/77 Reference Range: 80 - 120 mg / dl Electronic Signature(s) Signed: 01/23/2021 5:07:48 PM By: Zandra Abts RN, BSN Entered By: Zandra Abts on 01/23/2021 14:19:44

## 2021-01-29 ENCOUNTER — Encounter (HOSPITAL_BASED_OUTPATIENT_CLINIC_OR_DEPARTMENT_OTHER): Payer: Medicare Other | Admitting: Internal Medicine

## 2021-01-29 LAB — AEROBIC/ANAEROBIC CULTURE W GRAM STAIN (SURGICAL/DEEP WOUND): Gram Stain: NONE SEEN

## 2021-01-31 DIAGNOSIS — Z7982 Long term (current) use of aspirin: Secondary | ICD-10-CM | POA: Diagnosis not present

## 2021-01-31 DIAGNOSIS — I890 Lymphedema, not elsewhere classified: Secondary | ICD-10-CM | POA: Diagnosis not present

## 2021-01-31 DIAGNOSIS — I87332 Chronic venous hypertension (idiopathic) with ulcer and inflammation of left lower extremity: Secondary | ICD-10-CM | POA: Diagnosis not present

## 2021-01-31 DIAGNOSIS — I739 Peripheral vascular disease, unspecified: Secondary | ICD-10-CM | POA: Diagnosis not present

## 2021-01-31 DIAGNOSIS — K5090 Crohn's disease, unspecified, without complications: Secondary | ICD-10-CM | POA: Diagnosis not present

## 2021-01-31 DIAGNOSIS — Z8781 Personal history of (healed) traumatic fracture: Secondary | ICD-10-CM | POA: Diagnosis not present

## 2021-01-31 DIAGNOSIS — L97821 Non-pressure chronic ulcer of other part of left lower leg limited to breakdown of skin: Secondary | ICD-10-CM | POA: Diagnosis not present

## 2021-01-31 DIAGNOSIS — I10 Essential (primary) hypertension: Secondary | ICD-10-CM | POA: Diagnosis not present

## 2021-01-31 DIAGNOSIS — Z86718 Personal history of other venous thrombosis and embolism: Secondary | ICD-10-CM | POA: Diagnosis not present

## 2021-01-31 DIAGNOSIS — I350 Nonrheumatic aortic (valve) stenosis: Secondary | ICD-10-CM | POA: Diagnosis not present

## 2021-01-31 DIAGNOSIS — L03116 Cellulitis of left lower limb: Secondary | ICD-10-CM | POA: Diagnosis not present

## 2021-02-04 DIAGNOSIS — L97821 Non-pressure chronic ulcer of other part of left lower leg limited to breakdown of skin: Secondary | ICD-10-CM | POA: Diagnosis not present

## 2021-02-04 DIAGNOSIS — K5090 Crohn's disease, unspecified, without complications: Secondary | ICD-10-CM | POA: Diagnosis not present

## 2021-02-04 DIAGNOSIS — I739 Peripheral vascular disease, unspecified: Secondary | ICD-10-CM | POA: Diagnosis not present

## 2021-02-04 DIAGNOSIS — Z7982 Long term (current) use of aspirin: Secondary | ICD-10-CM | POA: Diagnosis not present

## 2021-02-04 DIAGNOSIS — I87332 Chronic venous hypertension (idiopathic) with ulcer and inflammation of left lower extremity: Secondary | ICD-10-CM | POA: Diagnosis not present

## 2021-02-04 DIAGNOSIS — L03116 Cellulitis of left lower limb: Secondary | ICD-10-CM | POA: Diagnosis not present

## 2021-02-04 DIAGNOSIS — I350 Nonrheumatic aortic (valve) stenosis: Secondary | ICD-10-CM | POA: Diagnosis not present

## 2021-02-04 DIAGNOSIS — I890 Lymphedema, not elsewhere classified: Secondary | ICD-10-CM | POA: Diagnosis not present

## 2021-02-04 DIAGNOSIS — Z86718 Personal history of other venous thrombosis and embolism: Secondary | ICD-10-CM | POA: Diagnosis not present

## 2021-02-04 DIAGNOSIS — Z8781 Personal history of (healed) traumatic fracture: Secondary | ICD-10-CM | POA: Diagnosis not present

## 2021-02-04 DIAGNOSIS — I10 Essential (primary) hypertension: Secondary | ICD-10-CM | POA: Diagnosis not present

## 2021-02-05 ENCOUNTER — Other Ambulatory Visit: Payer: Self-pay

## 2021-02-05 ENCOUNTER — Ambulatory Visit: Payer: Medicare Other | Admitting: Obstetrics & Gynecology

## 2021-02-05 ENCOUNTER — Encounter: Payer: Self-pay | Admitting: Obstetrics & Gynecology

## 2021-02-05 VITALS — BP 146/84 | HR 80

## 2021-02-05 DIAGNOSIS — N813 Complete uterovaginal prolapse: Secondary | ICD-10-CM

## 2021-02-05 DIAGNOSIS — N3281 Overactive bladder: Secondary | ICD-10-CM

## 2021-02-05 DIAGNOSIS — Z4689 Encounter for fitting and adjustment of other specified devices: Secondary | ICD-10-CM

## 2021-02-05 NOTE — Progress Notes (Signed)
° ° ° °  GYN VISIT Patient name: ORIYA KETTERING MRN [id number]  Date of birth: [birth year] Chief Complaint:    Chief Complaint  Patient presents with   Pessary Check   Blood Pressure Check   History of Present Illness:   DENIM START is a 80 y.o. PM female being seen today for pessary maintenance.     Blood pressure (!) 146/84, pulse 80.  DOMINIC RHOME presents today for routine follow up related to her pessary.   She uses a Ring #5 She reports no vaginal discharge.  She does note occasional pink spotting- usually once per week.  She states this has been going on for quite some time.  She denies pelvic or abdominal pain.  Overall no acute concerns regarding her pessary.  Likert scale(1 not bothersome -5 very bothersome)  :  1  Review of Systems:   Pertinent items are noted in HPI Denies fever/chills, dizziness, headaches, visual disturbances, fatigue, shortness of breath, chest pain, abdominal pain, vomiting,  bowel movements, urination unless otherwise stated above.  Pertinent History Reviewed:  Reviewed past medical,surgical, social, obstetrical and family history.  Reviewed problem list, medications and allergies. Physical Assessment:   Vitals:   02/05/21 1348  BP: (!) 146/84  Pulse: 80  There is no height or weight on file to calculate BMI.       Physical Examination:   General appearance: alert, well appearing, and in no distress  Psych: mood appropriate, normal affect  Skin: warm & dry   Cardiovascular: normal heart rate noted  Respiratory: normal respiratory effort, no distress  Abdomen: soft, non-tender  GU: normal external genitalia- Stage 2 prolapse noted with pessary in place, device removed, stage 4 prolapse noted  Exam reveals no undue vaginal mucosal pressure of breakdown, no discharge and no vaginal bleeding.  Vaginal Epithelial Abnormality Classification System:   0 0    No abnormalities 1    Epithelial erythema 2    Granulation tissue 3    Epithelial break  or erosion, 1 cm or less 4    Epithelial break or erosion, 1 cm or greater  The pessary is removed, irrigation completed, device was cleaned and replaced without difficulty.    Extremities: bilateral legs in wraps- followed by wound care  Chaperone: Malachy Mood    Assessment & Plan:     ICD-10-CM   1. 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 OAB (overactive bladder): chronic + suboptimally managed  N32.81       -doing well with current device -continue q 54mos appointments -currently on myrbetriq 50mg  daily for OAB   Return in about 4 months (around 06/06/2021) for with Dr. 06/08/2021 or Despina Hidden.   Charlotta Newton, DO Attending Obstetrician & Gynecologist, [facility] for [facility]

## 2021-02-07 DIAGNOSIS — L03116 Cellulitis of left lower limb: Secondary | ICD-10-CM | POA: Diagnosis not present

## 2021-02-07 DIAGNOSIS — I350 Nonrheumatic aortic (valve) stenosis: Secondary | ICD-10-CM | POA: Diagnosis not present

## 2021-02-07 DIAGNOSIS — K5090 Crohn's disease, unspecified, without complications: Secondary | ICD-10-CM | POA: Diagnosis not present

## 2021-02-07 DIAGNOSIS — I739 Peripheral vascular disease, unspecified: Secondary | ICD-10-CM | POA: Diagnosis not present

## 2021-02-07 DIAGNOSIS — I87332 Chronic venous hypertension (idiopathic) with ulcer and inflammation of left lower extremity: Secondary | ICD-10-CM | POA: Diagnosis not present

## 2021-02-07 DIAGNOSIS — Z86718 Personal history of other venous thrombosis and embolism: Secondary | ICD-10-CM | POA: Diagnosis not present

## 2021-02-07 DIAGNOSIS — Z8781 Personal history of (healed) traumatic fracture: Secondary | ICD-10-CM | POA: Diagnosis not present

## 2021-02-07 DIAGNOSIS — Z7982 Long term (current) use of aspirin: Secondary | ICD-10-CM | POA: Diagnosis not present

## 2021-02-07 DIAGNOSIS — I890 Lymphedema, not elsewhere classified: Secondary | ICD-10-CM | POA: Diagnosis not present

## 2021-02-07 DIAGNOSIS — I10 Essential (primary) hypertension: Secondary | ICD-10-CM | POA: Diagnosis not present

## 2021-02-07 DIAGNOSIS — L97821 Non-pressure chronic ulcer of other part of left lower leg limited to breakdown of skin: Secondary | ICD-10-CM | POA: Diagnosis not present

## 2021-02-11 DIAGNOSIS — Z7982 Long term (current) use of aspirin: Secondary | ICD-10-CM | POA: Diagnosis not present

## 2021-02-11 DIAGNOSIS — I87332 Chronic venous hypertension (idiopathic) with ulcer and inflammation of left lower extremity: Secondary | ICD-10-CM | POA: Diagnosis not present

## 2021-02-11 DIAGNOSIS — I350 Nonrheumatic aortic (valve) stenosis: Secondary | ICD-10-CM | POA: Diagnosis not present

## 2021-02-11 DIAGNOSIS — I10 Essential (primary) hypertension: Secondary | ICD-10-CM | POA: Diagnosis not present

## 2021-02-11 DIAGNOSIS — I890 Lymphedema, not elsewhere classified: Secondary | ICD-10-CM | POA: Diagnosis not present

## 2021-02-11 DIAGNOSIS — I739 Peripheral vascular disease, unspecified: Secondary | ICD-10-CM | POA: Diagnosis not present

## 2021-02-11 DIAGNOSIS — Z8781 Personal history of (healed) traumatic fracture: Secondary | ICD-10-CM | POA: Diagnosis not present

## 2021-02-11 DIAGNOSIS — L03116 Cellulitis of left lower limb: Secondary | ICD-10-CM | POA: Diagnosis not present

## 2021-02-11 DIAGNOSIS — Z86718 Personal history of other venous thrombosis and embolism: Secondary | ICD-10-CM | POA: Diagnosis not present

## 2021-02-11 DIAGNOSIS — L97821 Non-pressure chronic ulcer of other part of left lower leg limited to breakdown of skin: Secondary | ICD-10-CM | POA: Diagnosis not present

## 2021-02-11 DIAGNOSIS — K5090 Crohn's disease, unspecified, without complications: Secondary | ICD-10-CM | POA: Diagnosis not present

## 2021-02-12 ENCOUNTER — Encounter (HOSPITAL_BASED_OUTPATIENT_CLINIC_OR_DEPARTMENT_OTHER): Payer: Medicare Other | Attending: Internal Medicine | Admitting: Internal Medicine

## 2021-02-12 ENCOUNTER — Other Ambulatory Visit: Payer: Self-pay

## 2021-02-12 DIAGNOSIS — L03116 Cellulitis of left lower limb: Secondary | ICD-10-CM | POA: Diagnosis not present

## 2021-02-12 DIAGNOSIS — I350 Nonrheumatic aortic (valve) stenosis: Secondary | ICD-10-CM | POA: Diagnosis not present

## 2021-02-12 DIAGNOSIS — L97821 Non-pressure chronic ulcer of other part of left lower leg limited to breakdown of skin: Secondary | ICD-10-CM | POA: Insufficient documentation

## 2021-02-12 DIAGNOSIS — I87332 Chronic venous hypertension (idiopathic) with ulcer and inflammation of left lower extremity: Secondary | ICD-10-CM | POA: Diagnosis not present

## 2021-02-12 DIAGNOSIS — I890 Lymphedema, not elsewhere classified: Secondary | ICD-10-CM | POA: Insufficient documentation

## 2021-02-12 DIAGNOSIS — L97322 Non-pressure chronic ulcer of left ankle with fat layer exposed: Secondary | ICD-10-CM | POA: Diagnosis not present

## 2021-02-12 DIAGNOSIS — Z86718 Personal history of other venous thrombosis and embolism: Secondary | ICD-10-CM | POA: Diagnosis not present

## 2021-02-12 NOTE — Progress Notes (Addendum)
Barbara, Lucero ([id number]) Visit Report for 02/12/2021 HPI Details Patient Name: Date of Service: Barbara Lucero 02/12/2021 1:30 PM Medical Record Number: [id number] Patient Account Number: [id number] Date of Birth/Sex: Treating RN: 1940-08-14 (80 y.o. Barbara Lucero Primary Care Provider: Allyn Kenner Other Clinician: Referring Provider: Treating Provider/Extender: Adolm Joseph in Treatment: 24 History of Present Illness HPI Description: ADMISSION 08/23/2020 This is a 80 year old woman who arrives accompanied by her sister-in-law. 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 She has been dealing with chronic edema and discoloration of her legs for several years per her sister-in-law. I note that she saw Dr. Trula Slade on 05/21/2020 and he noted an extensive left leg DVT in 2019 although she is no longer on oral anticoagulants. He felt she had chronic venous insufficiency with skin changes related to this. He ordered her 20/30 stockings which were zippered stockings but I do not get the sense that the patient was all that compliant. In any case she started to develop weeping edema fluid recently coming out of the right lateral ankle serous drainage. She said it was pouring out last week. She saw her primary care provider on 08/08/2020 and was put on clindamycin which she is just finishing. She says the erythema feels better. Past medical history, aortic stenosis, chronic venous insufficiency, peripheral vascular disease, Crohn's disease which has been really recently quiescent, left hip fracture, DVT of the left leg in 2019. Notable that she has a very disabled son at home who she is the primary caregiver for Dr. Donzetta Matters quoted an ABI of 1.12 on the right with a TBI of 0.65 on the left and 0.93 and a TBI of 0.58. In our clinic today the ABI was 0.83 on the right and 0.81 on the left 7/7 the patient's wound on the right lateral ankle is healed. Her edema control is much  better. It turns out she does not have stockings but came in with Tubigrip on the left leg Readmission: 10/01/2020 on evaluation today patient appears to be doing decently well in regard to her leg. Unfortunately she does have an opening which happened as a result of her not wearing the compression socks she had something on the leg which was okay not necessarily the best but unfortunately did not include anything for the foot which is the main issue that we are finding here. With that being said I discussed with the patient that she really needs to have something for both and I think ordering her juxta lite compression wrap would be the ideal thing to do. 10/09/2020; patient I last saw in early July. At that point she had an area on the right lateral ankle which closed over. I am not really sure what happened however she is readmitted to the clinic last week. She had an area on her right lateral ankle this is closed however she has another area on the right lateral foot also quite a bit of discomfort on the right anterior foot. She has nothing open on the left leg. She comes in today with a single juxta lite stocking. She has a mid calf compression stocking she bought off [place] on the left 10/16/2020; everything that we are concerned about on the right leg is healed including the right lateral ankle and right dorsal foot. She has another juxta light for the right leg, she had 1 on the left. The venous reflux studies I ordered last week are on Thursday. I will  see her back 1 more time to review these. She had already been seen by Dr. Trula Slade 9/6; back in to discuss her reflux studies. This showed reflux in the greater saphenous vein on the right. I am going to send her back for Dr. Trula Slade to review these. She is wearing her juxta lite stockings but only had 20/30 mmHg compression. She became concerned this morning with erythema on the right leg. She says she has an appoint with Dr. Trula Slade in about 2  weeks 12/04/2020; patient presents after being discharged 1 month ago from our clinic. She states that she has been wearing her compression stockings intermittently. She has open wounds to her lower extremities bilaterally limited to skin breakdown. She has increased redness to her right foot and increased weeping to the right ankle area. She has increased pain to the right lower extremity as well. She has not followed up with vein and vascular since discharge from our clinic last month as recommended. 10/18; I see the patient was readmitted last week for again small weeping areas on the right lateral and a larger superficial weeping area on the left lateral lower legs she has an appointment with Dr. Trula Slade to review the reflux studies to see if anything could be ablated from a superficial vein point of view. We are using 3 layer compression on 10/24; patient presents for follow-up. She states she saw vein and vascular today. She reports that they recommended compression stocking. She states that the 3 layer compression was uncomfortable and would like to go down to the Kerlix/Coban. She reports increased redness, Warmth and pain to the left lower extrem 11/1; the area on the right anterior lower leg is closed. She complains of a painful area on the left lateral lower leg just above the ankle. Once again there is erythema here. I put her on Augmentin last week empirically this is not had any effect. 11/8; the right anterior lower leg is remained closed. I gave her doxycycline for the area on the left lateral lower leg just above the ankle. Wounds are measuring somewhat smaller. Still marked erythema and tenderness although I have difficulty believing that this is cellulitis. We are putting her in 2 layer compression. She has a juxta lite stocking on the right leg I note that 3 weeks ago I said that she was seeing Dr. Trula Slade of follow-up for venous reflux although I do not have anything further on  this. This will need to be researched 11/15; right anterior lower leg remains closed. We have been using polymen to the left lateral ankle area. I put TCA to this area to help with the inflammation. I also gave her doxycycline although I had my doubts that this was cellulitis. 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 We have been using polymen on the left lateral ankle with TCA. She completed her doxycycline. As usual she comes in complaining of a lot of pain worse when she walks. She has been followed by Dr. Trula Slade although I do not see that she has had a recent appointment. She has been on a multitude of different antibiotics without real cultures. There is less erythema around the wound since last visit but it is measuring slightly larger 12/20; which he was here last time deep tissue culture of the wound on the lateral ankle showed rare Pseudomonas and rare Enterococcus. I did not treat this with systemic antibiotics rather topical  Neosporin. She comes in today with the wound looking quite good. She has a usual amount of erythema and warmth in the left leg. Electronic Signature(s) Signed: 02/12/2021 4:29:35 PM By: Linton Ham MD Entered By: Linton Ham on 02/12/2021 14:34:36 -------------------------------------------------------------------------------- Physical Exam Details Patient Name: Date of Service: Barbara Cage B. 02/12/2021 1:30 PM Medical Record Number: [id number] Patient Account Number: [id number] Date of Birth/Sex: Treating RN: [birth year] (80 y.o. Barbara Lucero Primary Care Provider: Allyn Kenner Other Clinician: Referring Provider: Treating Provider/Extender: Adolm Joseph in Treatment: 24 Constitutional Patient is hypertensive.. Pulse regular and within target range for patient.Marland Kitchen Respirations regular, non-labored and within target range.. Temperature is normal  and within the target range for the patient.Marland Kitchen Appears in no distress. Notes Wound exam; left lateral ankle. This is now superficial epithelializing. No evidence of surrounding infection. She has confluent erythema in the left leg this is nontender but somewhat warm I do not believe that this is infection Electronic Signature(s) Signed: 02/12/2021 4:29:35 PM By: Linton Ham MD Entered By: Linton Ham on 02/12/2021 14:36:11 -------------------------------------------------------------------------------- Physician Orders Details Patient Name: Date of Service: Barbara Cage B. 02/12/2021 1:30 PM Medical Record Number: [id number] Patient Account Number: [id number] Date of Birth/Sex: Treating RN: October 06, 1940 (80 y.o. [name], [name] Primary Care Provider: Allyn Kenner Other Clinician: Referring Provider: Treating Provider/Extender: Adolm Joseph in Treatment: 24 Verbal / Phone Orders: No Diagnosis Coding Follow-up Appointments ppointment in 2 weeks. - Dr. Dellia Nims Return A Bathing/ Shower/ Hygiene May shower with protection but do not get wound dressing(s) wet. Edema Control - Lymphedema / SCD / Other Bilateral Lower Extremities Elevate legs to the level of the heart or above for 30 minutes daily and/or when sitting, a frequency of: - throughout the day Avoid standing for long periods of time. Exercise regularly Compression stocking or Garment 20-30 mm/Hg pressure to: - right leg daily Home Health dmit to [facility] for wound care. May utilize formulary equivalent dressing for wound treatment orders unless otherwise specified. - A Home health to change 2 x a week Wound Treatment Wound #4 - Ankle Wound Laterality: Left, Lateral Cleanser: Soap and Water (Home Health) 2 x Per Week/15 Days Discharge Instructions: May shower and wash wound with dial antibacterial soap and water prior to dressing change. Cleanser: Wound Cleanser (Home Health) 2 x Per Week/15  Days Discharge Instructions: Cleanse the wound with wound cleanser prior to applying a clean dressing using gauze sponges, not tissue or cotton balls. Peri-Wound Care: Triamcinolone 15 (g) (Home Health) 2 x Per Week/15 Days Discharge Instructions: Use triamcinolone 15 (g) mixed with lotion to lower leg Peri-Wound Care: Sween Lotion (Moisturizing lotion) (Home Health) 2 x Per Week/15 Days Discharge Instructions: Apply moisturizing lotion as directed Topical: neosporin (Home Health) 2 x Per Week/15 Days Prim Dressing: Hydrofera Blue Classic Foam, 2x2 in (Home Health) 2 x Per Week/15 Days ary Discharge Instructions: Moisten with saline prior to applying to wound bed Secondary Dressing: Woven Gauze Sponge, Non-Sterile 4x4 in (Home Health) 2 x Per Week/15 Days Discharge Instructions: Apply over primary dressing as directed. Secondary Dressing: ABD Pad, 8x10 (Home Health) 2 x Per Week/15 Days Discharge Instructions: Apply over primary dressing as directed. Compression Wrap: Kerlix Roll 4.5x3.1 (in/yd) (Home Health) 2 x Per Week/15 Days Discharge Instructions: Apply Kerlix and Coban compression as directed. Compression Wrap: Coban Self-Adherent Wrap 4x5 (in/yd) (Home Health) 2 x Per Week/15 Days Discharge Instructions: Apply over Kerlix as directed. Patient Medications llergies:  coconut, Latex, Natural Rubber A Notifications Medication Indication Start End stasis dermatitis 02/12/2021 triamcinolone acetonide DOSE topical 0.1 % cream - cream topical liberally to erythematous skin left leg with dressing changes Electronic Signature(s) Signed: 02/12/2021 2:32:37 PM By: Baltazar Najjar MD Entered By: Baltazar Najjar on 02/12/2021 14:32:36 -------------------------------------------------------------------------------- Problem List Details Patient Name: Date of Service: Barbara Sheets B. 02/12/2021 1:30 PM Medical Record Number: [id number] Patient Account Number: [id number] Date of  Birth/Sex: Treating RN: [birth year] (80 y.o. 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 limited to 12/04/2020 No Yes breakdown of skin I89.0 Lymphedema, not elsewhere classified 08/23/2020 No Yes Inactive Problems ICD-10 Code Description Active Date Inactive Date I87.331 Chronic venous hypertension (idiopathic) with ulcer and inflammation of right lower 08/23/2020 08/23/2020 extremity L97.811 Non-pressure chronic ulcer of other part of right lower leg limited to breakdown of skin 08/23/2020 08/23/2020 L03.115 Cellulitis of right lower limb 10/09/2020 10/09/2020 Resolved Problems Electronic Signature(s) Signed: 02/12/2021 4:29:35 PM By: Baltazar Najjar MD Entered By: Baltazar Najjar on 02/12/2021 14:33:13 -------------------------------------------------------------------------------- Progress Note Details Patient Name: Date of Service: Barbara Sheets B. 02/12/2021 1:30 PM Medical Record Number: [id number] Patient Account Number: [id number] Date of Birth/Sex: Treating RN: 18-Dec-1940 (80 y.o. [name] Primary Care Provider: [name] Other Clinician: Referring Provider: Treating Provider/Extender: [name] in Treatment: 24 Subjective History of Present Illness (HPI) ADMISSION 08/23/2020 This is a 80 year old woman who arrives accompanied by her sister-in-law. 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 She has been dealing with chronic edema and discoloration of her legs for several years per her  sister-in-law. I note that she saw Dr. Myra Gianotti on 05/21/2020 and he noted an extensive left leg DVT in 2019 although she is no longer on oral anticoagulants. He felt she had chronic venous insufficiency with skin changes related to this. He ordered her 20/30 stockings which were zippered stockings but I do not get the sense that the patient was all that compliant. In any case she started to develop weeping edema fluid recently coming out of the right lateral ankle serous drainage. She said it was pouring out last week. She saw her primary care provider on 08/08/2020 and was put on clindamycin which she is just finishing. She says the erythema feels better. Past medical history, aortic stenosis, chronic venous insufficiency, peripheral vascular disease, Crohn's disease which has been really recently quiescent, left hip fracture, DVT of the left leg in 2019. Notable that she has a very disabled son at home who she is the primary caregiver for Dr. Randie Heinz quoted an ABI of 1.12 on the right with a TBI of 0.65 on the left and 0.93 and a TBI of 0.58. In our clinic today the ABI was 0.83 on the right and 0.81 on the left 7/7 the patient's wound on the right lateral ankle is healed. Her edema control is much better. It turns out she does not have stockings but came in with Tubigrip on the left leg Readmission: 10/01/2020 on evaluation today patient appears to be doing decently well in regard to her leg. Unfortunately she does have an opening which happened  as a result of her not wearing the compression socks she had something on the leg which was okay not necessarily the best but unfortunately did not include anything for the foot which is the main issue that we are finding here. With that being said I discussed with the patient that she really needs to have something for both and I think ordering her juxta lite compression wrap would be the ideal thing to do. 10/09/2020; patient I last saw in early July. At that  point she had an area on the right lateral ankle which closed over. I am not really sure what happened however she is readmitted to the clinic last week. She had an area on her right lateral ankle this is closed however she has another area on the right lateral foot also quite a bit of discomfort on the right anterior foot. She has nothing open on the left leg. She comes in today with a single juxta lite stocking. She has a mid calf compression stocking she bought off [place] on the left 10/16/2020; everything that we are concerned about on the right leg is healed including the right lateral ankle and right dorsal foot. She has another juxta light for the right leg, she had 1 on the left. The venous reflux studies I ordered last week are on Thursday. I will see her back 1 more time to review these. She had already been seen by Dr. Trula Slade 9/6; back in to discuss her reflux studies. This showed reflux in the greater saphenous vein on the right. I am going to send her back for Dr. Trula Slade to review these. She is wearing her juxta lite stockings but only had 20/30 mmHg compression. She became concerned this morning with erythema on the right leg. She says she has an appoint with Dr. Trula Slade in about 2 weeks 12/04/2020; patient presents after being discharged 1 month ago from our clinic. She states that she has been wearing her compression stockings intermittently. She has open wounds to her lower extremities bilaterally limited to skin breakdown. She has increased redness to her right foot and increased weeping to the right ankle area. She has increased pain to the right lower extremity as well. She has not followed up with vein and vascular since discharge from our clinic last month as recommended. 10/18; I see the patient was readmitted last week for again small weeping areas on the right lateral and a larger superficial weeping area on the left lateral lower legs she has an appointment with Dr. Trula Slade  to review the reflux studies to see if anything could be ablated from a superficial vein point of view. We are using 3 layer compression on 10/24; patient presents for follow-up. She states she saw vein and vascular today. She reports that they recommended compression stocking. She states that the 3 layer compression was uncomfortable and would like to go down to the Kerlix/Coban. She reports increased redness, Warmth and pain to the left lower extrem 11/1; the area on the right anterior lower leg is closed. She complains of a painful area on the left lateral lower leg just above the ankle. Once again there is erythema here. I put her on Augmentin last week empirically this is not had any effect. 11/8; the right anterior lower leg is remained closed. I gave her doxycycline for the area on the left lateral lower leg just above the ankle. Wounds are measuring somewhat smaller. Still marked erythema and tenderness although I have difficulty believing  that this is cellulitis. We are putting her in 2 layer compression. She has a juxta lite stocking on the right leg I note that 3 weeks ago I said that she was seeing Dr. Trula Slade of follow-up for venous reflux although I do not have anything further on this. This will need to be researched 11/15; right anterior lower leg remains closed. We have been using polymen to the left lateral ankle area. I put TCA to this area to help with the inflammation. I also gave her doxycycline although I had my doubts that this was cellulitis. 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 We have been using polymen on the left lateral ankle with TCA. She completed her doxycycline. As usual she comes in complaining of a lot of pain worse when she walks. She has been followed by Dr. Trula Slade although I do not see that she has had a recent appointment. She has been on a multitude of different antibiotics  without real cultures. There is less erythema around the wound since last visit but it is measuring slightly larger 12/20; which he was here last time deep tissue culture of the wound on the lateral ankle showed rare Pseudomonas and rare Enterococcus. I did not treat this with systemic antibiotics rather topical Neosporin. She comes in today with the wound looking quite good. She has a usual amount of erythema and warmth in the left leg. Objective Constitutional Patient is hypertensive.. Pulse regular and within target range for patient.Marland Kitchen Respirations regular, non-labored and within target range.. Temperature is normal and within the target range for the patient.Marland Kitchen Appears in no distress. Vitals Time Taken: 1:28 PM, Height: 63 in, Weight: 182 lbs, BMI: 32.2, Temperature: 98.0 F, Pulse: 74 bpm, Respiratory Rate: 16 breaths/min, Blood Pressure: 180/75 mmHg. General Notes: Wound exam; left lateral ankle. This is now superficial epithelializing. No evidence of surrounding infection. She has confluent erythema in the left leg this is nontender but somewhat warm I do not believe that this is infection Integumentary (Hair, Skin) Wound #4 status is Open. Original cause of wound was Blister. The date acquired was: 11/26/2020. The wound has been in treatment 10 weeks. The wound is located on the Left,Lateral Ankle. The wound measures 4.5cm length x 2cm width x 0.1cm depth; 7.069cm^2 area and 0.707cm^3 volume. There is Fat Layer (Subcutaneous Tissue) exposed. There is a medium amount of purulent drainage noted. The wound margin is distinct with the outline attached to the wound base. There is large (67-100%) pink granulation within the wound bed. There is a small (1-33%) amount of necrotic tissue within the wound bed including Adherent Slough. 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 - Dr. Dellia Nims Bathing/ Shower/ Hygiene: May shower with protection but do not get wound dressing(s) wet. Edema Control - Lymphedema / SCD / Other: Elevate legs to the level of the heart or above for 30 minutes daily and/or when sitting, a frequency of: - throughout the day Avoid standing for long periods of time. Exercise regularly Compression stocking or Garment 20-30 mm/Hg pressure to: - right leg daily Home Health: Admit to [facility] for wound care. May utilize formulary equivalent dressing for wound treatment orders unless  otherwise specified. - Home health to change 2 x a week The following medication(s) was prescribed: triamcinolone acetonide topical 0.1 % cream cream topical liberally to erythematous skin left leg with dressing changes for stasis dermatitis starting 02/12/2021 WOUND #4: - Ankle Wound Laterality: Left, Lateral Cleanser: Soap and Water (Home Health) 2 x Per Week/15 Days Discharge Instructions: May shower and wash wound with dial antibacterial soap and water prior to dressing change. Cleanser: Wound Cleanser (Home Health) 2 x Per Week/15 Days Discharge Instructions: Cleanse the wound with wound cleanser prior to applying a clean dressing using gauze sponges, not tissue or cotton balls. Peri-Wound Care: Triamcinolone 15 (g) (Home Health) 2 x Per Week/15 Days Discharge Instructions: Use triamcinolone 15 (g) mixed with lotion to lower leg Peri-Wound Care: Sween Lotion (Moisturizing lotion) (Home Health) 2 x Per Week/15 Days Discharge Instructions: Apply moisturizing lotion as directed Topical: neosporin (Home Health) 2 x Per Week/15 Days Prim Dressing: Hydrofera Blue Classic Foam, 2x2 in (Home Health) 2 x Per Week/15 Days ary Discharge Instructions: Moisten with saline prior  to applying to wound bed Secondary Dressing: Woven Gauze Sponge, Non-Sterile 4x4 in (Home Health) 2 x Per Week/15 Days Discharge Instructions: Apply over primary dressing as directed. Secondary Dressing: ABD Pad, 8x10 (Home Health) 2 x Per Week/15 Days Discharge Instructions: Apply over primary dressing as directed. Com pression Wrap: Kerlix Roll 4.5x3.1 (in/yd) (Home Health) 2 x Per Week/15 Days Discharge Instructions: Apply Kerlix and Coban compression as directed. Com pression Wrap: Coban Self-Adherent Wrap 4x5 (in/yd) (Home Health) 2 x Per Week/15 Days Discharge Instructions: Apply over Kerlix as directed. 1. Continuing with Neosporin and Hydrofera Blue, ABD kerlix Coban 2. Liberal TCA to the erythematous skin on her left leg which I think is stasis dermatitis it is nontender 3. The patient has home health. We will see her back in 2 weeks. She is to call us if there are any concerns Electronic Signature(s) Signed: 02/12/2021 4:29:35 PM By: Linton Ham MD Entered By: Linton Ham on 02/12/2021 14:38:57 -------------------------------------------------------------------------------- SuperBill Details Patient Name: Date of Service: Barbara Lucero 02/12/2021 Medical Record Number: [id number] Patient Account Number: [id number] Date of Birth/Sex: Treating RN: 02/09/1941 (80 y.o. 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

## 2021-02-14 DIAGNOSIS — I890 Lymphedema, not elsewhere classified: Secondary | ICD-10-CM | POA: Diagnosis not present

## 2021-02-14 DIAGNOSIS — Z8781 Personal history of (healed) traumatic fracture: Secondary | ICD-10-CM | POA: Diagnosis not present

## 2021-02-14 DIAGNOSIS — L97821 Non-pressure chronic ulcer of other part of left lower leg limited to breakdown of skin: Secondary | ICD-10-CM | POA: Diagnosis not present

## 2021-02-14 DIAGNOSIS — I10 Essential (primary) hypertension: Secondary | ICD-10-CM | POA: Diagnosis not present

## 2021-02-14 DIAGNOSIS — I350 Nonrheumatic aortic (valve) stenosis: Secondary | ICD-10-CM | POA: Diagnosis not present

## 2021-02-14 DIAGNOSIS — I87332 Chronic venous hypertension (idiopathic) with ulcer and inflammation of left lower extremity: Secondary | ICD-10-CM | POA: Diagnosis not present

## 2021-02-14 DIAGNOSIS — L03116 Cellulitis of left lower limb: Secondary | ICD-10-CM | POA: Diagnosis not present

## 2021-02-14 DIAGNOSIS — I739 Peripheral vascular disease, unspecified: Secondary | ICD-10-CM | POA: Diagnosis not present

## 2021-02-14 DIAGNOSIS — K5090 Crohn's disease, unspecified, without complications: Secondary | ICD-10-CM | POA: Diagnosis not present

## 2021-02-14 DIAGNOSIS — Z7982 Long term (current) use of aspirin: Secondary | ICD-10-CM | POA: Diagnosis not present

## 2021-02-14 DIAGNOSIS — Z86718 Personal history of other venous thrombosis and embolism: Secondary | ICD-10-CM | POA: Diagnosis not present

## 2021-02-21 DIAGNOSIS — I739 Peripheral vascular disease, unspecified: Secondary | ICD-10-CM | POA: Diagnosis not present

## 2021-02-21 DIAGNOSIS — I350 Nonrheumatic aortic (valve) stenosis: Secondary | ICD-10-CM | POA: Diagnosis not present

## 2021-02-21 DIAGNOSIS — L97821 Non-pressure chronic ulcer of other part of left lower leg limited to breakdown of skin: Secondary | ICD-10-CM | POA: Diagnosis not present

## 2021-02-21 DIAGNOSIS — Z86718 Personal history of other venous thrombosis and embolism: Secondary | ICD-10-CM | POA: Diagnosis not present

## 2021-02-21 DIAGNOSIS — L03116 Cellulitis of left lower limb: Secondary | ICD-10-CM | POA: Diagnosis not present

## 2021-02-21 DIAGNOSIS — Z7982 Long term (current) use of aspirin: Secondary | ICD-10-CM | POA: Diagnosis not present

## 2021-02-21 DIAGNOSIS — K5090 Crohn's disease, unspecified, without complications: Secondary | ICD-10-CM | POA: Diagnosis not present

## 2021-02-21 DIAGNOSIS — I87332 Chronic venous hypertension (idiopathic) with ulcer and inflammation of left lower extremity: Secondary | ICD-10-CM | POA: Diagnosis not present

## 2021-02-21 DIAGNOSIS — I890 Lymphedema, not elsewhere classified: Secondary | ICD-10-CM | POA: Diagnosis not present

## 2021-02-21 DIAGNOSIS — I10 Essential (primary) hypertension: Secondary | ICD-10-CM | POA: Diagnosis not present

## 2021-02-21 DIAGNOSIS — Z8781 Personal history of (healed) traumatic fracture: Secondary | ICD-10-CM | POA: Diagnosis not present

## 2021-02-22 DIAGNOSIS — I10 Essential (primary) hypertension: Secondary | ICD-10-CM | POA: Diagnosis not present

## 2021-02-22 DIAGNOSIS — E782 Mixed hyperlipidemia: Secondary | ICD-10-CM | POA: Diagnosis not present

## 2021-02-26 ENCOUNTER — Encounter (HOSPITAL_BASED_OUTPATIENT_CLINIC_OR_DEPARTMENT_OTHER): Payer: Medicare Other | Attending: Internal Medicine | Admitting: Internal Medicine

## 2021-02-26 ENCOUNTER — Other Ambulatory Visit: Payer: Self-pay

## 2021-02-26 DIAGNOSIS — I87332 Chronic venous hypertension (idiopathic) with ulcer and inflammation of left lower extremity: Secondary | ICD-10-CM | POA: Diagnosis not present

## 2021-02-26 DIAGNOSIS — I739 Peripheral vascular disease, unspecified: Secondary | ICD-10-CM | POA: Diagnosis not present

## 2021-02-26 DIAGNOSIS — I872 Venous insufficiency (chronic) (peripheral): Secondary | ICD-10-CM | POA: Insufficient documentation

## 2021-02-26 DIAGNOSIS — L97322 Non-pressure chronic ulcer of left ankle with fat layer exposed: Secondary | ICD-10-CM | POA: Diagnosis not present

## 2021-02-26 DIAGNOSIS — I890 Lymphedema, not elsewhere classified: Secondary | ICD-10-CM | POA: Diagnosis not present

## 2021-02-26 DIAGNOSIS — L03116 Cellulitis of left lower limb: Secondary | ICD-10-CM | POA: Diagnosis not present

## 2021-02-26 DIAGNOSIS — Z86718 Personal history of other venous thrombosis and embolism: Secondary | ICD-10-CM | POA: Diagnosis not present

## 2021-02-26 NOTE — Progress Notes (Signed)
CYRA, PINCOCK ([id number]) Visit Report for 02/26/2021 HPI Details Patient Name: Date of Service: Barbara Lucero 02/26/2021 2:00 PM Medical Record Number: [id number] Patient Account Number: [id number] Date of Birth/Sex: Treating RN: 1940-05-26 (81 y.o. F) Primary Care Provider: Allyn Kenner Other Clinician: Referring Provider: Treating Provider/Extender: Adolm Joseph in Treatment: 26 History of Present Illness HPI Description: ADMISSION 08/23/2020 This is a 81 year old woman who arrives accompanied by her sister-in-law. 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 She has been dealing with chronic edema and discoloration of her legs for several years per her sister-in-law. I note that she saw Dr. Trula Slade on 05/21/2020 and he noted an extensive left leg DVT in 2019 although she is no longer on oral anticoagulants. He felt she had chronic venous insufficiency with skin changes related to this. He ordered her 20/30 stockings which were zippered stockings but I do not get the sense that the patient was all that compliant. In any case she started to develop weeping edema fluid recently coming out of the right lateral ankle serous drainage. She said it was pouring out last week. She saw her primary care provider on 08/08/2020 and was put on clindamycin which she is just finishing. She says the erythema feels better. Past medical history, aortic stenosis, chronic venous insufficiency, peripheral vascular disease, Crohn's disease which has been really recently quiescent, left hip fracture, DVT of the left leg in 2019. Notable that she has a very disabled son at home who she is the primary caregiver for Dr. Donzetta Matters quoted an ABI of 1.12 on the right with a TBI of 0.65 on the left and 0.93 and a TBI of 0.58. In our clinic today the ABI was 0.83 on the right and 0.81 on the left 7/7 the patient's wound on the right lateral ankle is healed. Her edema control is much better. It turns out she  does not have stockings but came in with Tubigrip on the left leg Readmission: 10/01/2020 on evaluation today patient appears to be doing decently well in regard to her leg. Unfortunately she does have an opening which happened as a result of her not wearing the compression socks she had something on the leg which was okay not necessarily the best but unfortunately did not include anything for the foot which is the main issue that we are finding here. With that being said I discussed with the patient that she really needs to have something for both and I think ordering her juxta lite compression wrap would be the ideal thing to do. 10/09/2020; patient I last saw in early July. At that point she had an area on the right lateral ankle which closed over. I am not really sure what happened however she is readmitted to the clinic last week. She had an area on her right lateral ankle this is closed however she has another area on the right lateral foot also quite a bit of discomfort on the right anterior foot. She has nothing open on the left leg. She comes in today with a single juxta lite stocking. She has a mid calf compression stocking she bought off [place] on the left 10/16/2020; everything that we are concerned about on the right leg is healed including the right lateral ankle and right dorsal foot. She has another juxta light for the right leg, she had 1 on the left. The venous reflux studies I ordered last week are on Thursday. I will see her  back 1 more time to review these. She had already been seen by Dr. Myra Gianotti 9/6; back in to discuss her reflux studies. This showed reflux in the greater saphenous vein on the right. I am going to send her back for Dr. Myra Gianotti to review these. She is wearing her juxta lite stockings but only had 20/30 mmHg compression. She became concerned this morning with erythema on the right leg. She says she has an appoint with Dr. Myra Gianotti in about 2 weeks 12/04/2020; patient  presents after being discharged 1 month ago from our clinic. She states that she has been wearing her compression stockings intermittently. She has open wounds to her lower extremities bilaterally limited to skin breakdown. She has increased redness to her right foot and increased weeping to the right ankle area. She has increased pain to the right lower extremity as well. She has not followed up with vein and vascular since discharge from our clinic last month as recommended. 10/18; I see the patient was readmitted last week for again small weeping areas on the right lateral and a larger superficial weeping area on the left lateral lower legs she has an appointment with Dr. Myra Gianotti to review the reflux studies to see if anything could be ablated from a superficial vein point of view. We are using 3 layer compression on 10/24; patient presents for follow-up. She states she saw vein and vascular today. She reports that they recommended compression stocking. She states that the 3 layer compression was uncomfortable and would like to go down to the Kerlix/Coban. She reports increased redness, Warmth and pain to the left lower extrem 11/1; the area on the right anterior lower leg is closed. She complains of a painful area on the left lateral lower leg just above the ankle. Once again there is erythema here. I put her on Augmentin last week empirically this is not had any effect. 11/8; the right anterior lower leg is remained closed. I gave her doxycycline for the area on the left lateral lower leg just above the ankle. Wounds are measuring somewhat smaller. Still marked erythema and tenderness although I have difficulty believing that this is cellulitis. We are putting her in 2 layer compression. She has a juxta lite stocking on the right leg I note that 3 weeks ago I said that she was seeing Dr. Myra Gianotti of follow-up for venous reflux although I do not have anything further on this. This will need to be  researched 11/15; right anterior lower leg remains closed. We have been using polymen to the left lateral ankle area. I put TCA to this area to help with the inflammation. I also gave her doxycycline although I had my doubts that this was cellulitis. 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 We have been using polymen on the left lateral ankle with TCA. She completed her doxycycline. As usual she comes in complaining of a lot of pain worse when she walks. She has been followed by Dr. Myra Gianotti although I do not see that she has had a recent appointment. She has been on a multitude of different antibiotics without real cultures. There is less erythema around the wound since last visit but it is measuring slightly larger 12/20; which he was here last time deep tissue culture of the wound on the lateral ankle showed rare Pseudomonas and rare Enterococcus. I did not treat this with systemic antibiotics rather topical Neosporin. She  comes in today with the wound looking quite good. She has a usual amount of erythema and warmth in the left leg. 2023 .1/3; the patient's wound on the left lower leg/ankle is healed. The patient has chronic venous insufficiency and very significant stasis dermatitis followed by Dr. Trula Slade I think this is easily mistaken for cellulitis. She has juxta lite stockings and she is wearing this on the right leg. We will transition her into the left leg today. This is not the first time we have healed this area. Electronic Signature(s) Signed: 02/26/2021 3:57:23 PM By: Linton Ham MD Entered By: Linton Ham on 02/26/2021 14:41:55 -------------------------------------------------------------------------------- Physical Exam Details Patient Name: Date of Service: Barbara Cage B. 02/26/2021 2:00 PM Medical Record Number: [id number] Patient Account Number: [id number] Date of Birth/Sex: Treating  RN: 15-May-1940 (81 y.o. F) Primary Care Provider: Allyn Kenner Other Clinician: Referring Provider: Treating Provider/Extender: Adolm Joseph in Treatment: 26 Constitutional Patient is hypertensive.. Pulse regular and within target range for patient.Marland Kitchen Respirations regular, non-labored and within target range.. Temperature is normal and within the target range for the patient.Marland Kitchen Appears in no distress. Notes Wound exam; left lateral ankle all of this is healed. There is no surrounding tenderness. The patient has very significant chronic stasis dermatitis widely in her left lower leg Electronic Signature(s) Signed: 02/26/2021 3:57:23 PM By: Linton Ham MD Entered By: Linton Ham on 02/26/2021 14:42:52 -------------------------------------------------------------------------------- Physician Orders Details Patient Name: Date of Service: Barbara Cage B. 02/26/2021 2:00 PM Medical Record Number: [id number] Patient Account Number: [id number] Date of Birth/Sex: Treating RN: Apr 12, 1940 (81 y.o. 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 Edema Control - Lymphedema / SCD / Other Bilateral Lower Extremities Elevate legs to the level of the heart or above for 30 minutes daily and/or when sitting, a frequency of: - throughout the day Avoid standing for long periods of time. Exercise regularly Compression stocking or  Garment 20-30 mm/Hg pressure to: - Juxtalite to both legs daily Electronic Signature(s) Signed: 02/26/2021 3:57:23 PM By: Linton Ham MD Signed: 02/26/2021 5:47:12 PM By: Levan Hurst RN, BSN Entered By: Levan Hurst on 02/26/2021 14:21:13 -------------------------------------------------------------------------------- Problem List Details Patient Name: Date of Service: Barbara Cage B. 02/26/2021 2:00 PM Medical Record Number: [id number] Patient Account Number: [id number] Date of Birth/Sex: Treating RN: [birth year] (81 y.o. [name] Primary Care Provider: Allyn Kenner Other Clinician: Referring Provider: Treating Provider/Extender: Adolm Joseph in Treatment: 26 Active Problems ICD-10 Encounter Code Description Active Date MDM Diagnosis I87.332 Chronic venous hypertension (idiopathic) with ulcer and inflammation of left 12/04/2020 No Yes lower extremity L03.116 Cellulitis of left lower limb 12/25/2020 No Yes L97.921 Non-pressure chronic ulcer of unspecified part of left lower leg limited to 12/04/2020 No Yes breakdown of skin I89.0 Lymphedema, not elsewhere classified 08/23/2020 No Yes Inactive Problems ICD-10 Code Description Active Date Inactive Date I87.331 Chronic venous hypertension (idiopathic) with ulcer and inflammation of right lower 08/23/2020 08/23/2020 extremity L97.811 Non-pressure chronic ulcer of other part of right lower leg limited to  breakdown of skin 08/23/2020 08/23/2020 L03.115 Cellulitis of right lower limb 10/09/2020 10/09/2020 Resolved Problems Electronic Signature(s) Signed: 02/26/2021 3:57:23 PM By: Linton Ham MD Entered By: Linton Ham on 02/26/2021 14:39:19 -------------------------------------------------------------------------------- Progress Note Details Patient Name: Date of Service: Barbara Cage B. 02/26/2021 2:00 PM Medical Record Number: [id number] Patient Account Number: [id number] Date of Birth/Sex: Treating  RN: [birth year] (81 y.o. F) Primary Care Provider: Allyn Kenner Other Clinician: Referring Provider: Treating Provider/Extender: Adolm Joseph in Treatment: 26 Subjective History of Present Illness (HPI) ADMISSION 08/23/2020 This is a 81 year old woman who arrives accompanied by her sister-in-law. 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 She has been dealing with chronic edema and discoloration of her legs for several years per her sister-in-law. I note that she saw Dr. Trula Slade on 05/21/2020 and he noted an extensive left leg DVT in 2019 although she is no longer on oral anticoagulants. He felt she had chronic venous insufficiency with skin changes related to this. He ordered her 20/30 stockings which were zippered stockings but I do not get the sense that the patient was all that compliant. In any case she started to develop weeping edema fluid recently coming out of the right lateral ankle serous drainage. She said it was pouring out last week. She saw her primary care provider on 08/08/2020 and was put on clindamycin which she is just finishing. She says the erythema feels better. Past medical history, aortic stenosis, chronic venous insufficiency, peripheral vascular disease, Crohn's disease which has been really recently quiescent, left hip fracture, DVT of the left leg in 2019. Notable that she has a very disabled son at home who she is the primary caregiver for Dr. Donzetta Matters quoted an ABI of 1.12 on the right with a TBI of 0.65 on the left and 0.93 and a TBI of 0.58. In our clinic today the ABI was 0.83 on the right and 0.81 on the left 7/7 the patient's wound on the right lateral ankle is healed. Her edema control is much better. It turns out she does not have stockings but came in with Tubigrip on the left leg Readmission: 10/01/2020 on evaluation today patient appears to be doing decently well in regard to her leg. Unfortunately she does have an opening which happened as  a result of her not wearing the compression socks she had something on the leg which was okay not necessarily the best but unfortunately did not include anything for the foot which is the main issue that we are finding here. With that being said I discussed with the patient that she really needs to have something for both and I think ordering her juxta lite compression wrap would be the ideal thing to do. 10/09/2020; patient I last saw in early July. At that point she had an area on the right lateral ankle which closed over. I am not really sure what happened however she is readmitted to the clinic last week. She had an area on her right lateral ankle this is closed however she has another area on the right lateral foot also quite a bit of discomfort on the right anterior foot. She has nothing open on the left leg. She comes in today with a single juxta lite stocking. She has a mid calf compression stocking she bought off [place] on the left 10/16/2020; everything that we are concerned about on the right leg is healed including the right lateral ankle and right dorsal foot. She has another  juxta light for the right leg, she had 1 on the left. The venous reflux studies I ordered last week are on Thursday. I will see her back 1 more time to review these. She had already been seen by Dr. Trula Slade 9/6; back in to discuss her reflux studies. This showed reflux in the greater saphenous vein on the right. I am going to send her back for Dr. Trula Slade to review these. She is wearing her juxta lite stockings but only had 20/30 mmHg compression. She became concerned this morning with erythema on the right leg. She says she has an appoint with Dr. Trula Slade in about 2 weeks 12/04/2020; patient presents after being discharged 1 month ago from our clinic. She states that she has been wearing her compression stockings intermittently. She has open wounds to her lower extremities bilaterally limited to skin breakdown. She  has increased redness to her right foot and increased weeping to the right ankle area. She has increased pain to the right lower extremity as well. She has not followed up with vein and vascular since discharge from our clinic last month as recommended. 10/18; I see the patient was readmitted last week for again small weeping areas on the right lateral and a larger superficial weeping area on the left lateral lower legs she has an appointment with Dr. Trula Slade to review the reflux studies to see if anything could be ablated from a superficial vein point of view. We are using 3 layer compression on 10/24; patient presents for follow-up. She states she saw vein and vascular today. She reports that they recommended compression stocking. She states that the 3 layer compression was uncomfortable and would like to go down to the Kerlix/Coban. She reports increased redness, Warmth and pain to the left lower extrem 11/1; the area on the right anterior lower leg is closed. She complains of a painful area on the left lateral lower leg just above the ankle. Once again there is erythema here. I put her on Augmentin last week empirically this is not had any effect. 11/8; the right anterior lower leg is remained closed. I gave her doxycycline for the area on the left lateral lower leg just above the ankle. Wounds are measuring somewhat smaller. Still marked erythema and tenderness although I have difficulty believing that this is cellulitis. We are putting her in 2 layer compression. She has a juxta lite stocking on the right leg I note that 3 weeks ago I said that she was seeing Dr. Trula Slade of follow-up for venous reflux although I do not have anything further on this. This will need to be researched 11/15; right anterior lower leg remains closed. We have been using polymen to the left lateral ankle area. I put TCA to this area to help with the inflammation. I also gave her doxycycline although I had my doubts  that this was cellulitis. 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 We have been using polymen on the left lateral ankle with TCA. She completed her doxycycline. As usual she comes in complaining of a lot of pain worse when she walks. She has been followed by Dr. Trula Slade although I do not see that she has had a recent appointment. She has been on a multitude of different antibiotics without real cultures. There is less erythema around the wound since last visit but it is measuring slightly larger 12/20; which he was here last time deep  tissue culture of the wound on the lateral ankle showed rare Pseudomonas and rare Enterococcus. I did not treat this with systemic antibiotics rather topical Neosporin. She comes in today with the wound looking quite good. She has a usual amount of erythema and warmth in the left leg. 2023 .1/3; the patient's wound on the left lower leg/ankle is healed. The patient has chronic venous insufficiency and very significant stasis dermatitis followed by Dr. Trula Slade I think this is easily mistaken for cellulitis. She has juxta lite stockings and she is wearing this on the right leg. We will transition her into the left leg today. This is not the first time we have healed this area. Objective Constitutional Patient is hypertensive.. Pulse regular and within target range for patient.Marland Kitchen Respirations regular, non-labored and within target range.. Temperature is normal and within the target range for the patient.Marland Kitchen Appears in no distress. Vitals Time Taken: 2:07 PM, Height: 63 in, Weight: 182 lbs, BMI: 32.2, Temperature: 98.0 F, Pulse: 75 bpm, Respiratory Rate: 16 breaths/min, Blood Pressure: 150/84 mmHg. General Notes: Wound exam; left lateral ankle all of this is healed. There is no surrounding tenderness. The patient has very significant chronic stasis dermatitis widely in her left lower  leg Integumentary (Hair, Skin) Wound #4 status is Open. Original cause of wound was Blister. The date acquired was: 11/26/2020. The wound has been in treatment 12 weeks. The wound is located on the Left,Lateral Ankle. The wound measures 0cm length x 0cm width x 0cm depth; 0cm^2 area and 0cm^3 volume. There is no tunneling or undermining noted. There is a none present amount of drainage noted. The wound margin is distinct with the outline attached to the wound base. There is no granulation within the wound bed. There is no necrotic tissue within the wound bed. 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 Edema Control - Lymphedema / SCD / Other: Elevate legs to the level of the heart or above for 30 minutes daily and/or when sitting, a frequency of: - throughout the day Avoid standing for long periods of time. Exercise regularly Compression stocking or Garment 20-30 mm/Hg pressure to: - Juxtalite to both legs daily 1. The patient can be discharged from the clinic 2. Back to her own juxta lite stockings. 3. I told the patient that she will need to monitor the left lower leg and ankle area for edema if she has significant edema they will need to increase the compression on the juxta light to 30/40 mmHg. Also emphasized moisturizing the leg and keeping her legs elevated when she is sitting for a prolonged period. Electronic Signature(s) Signed: 02/26/2021 3:57:23 PM By: Linton Ham MD Entered By: Linton Ham on 02/26/2021 14:44:00 -------------------------------------------------------------------------------- SuperBill Details Patient Name: Date of Service: Barbara Lucero 02/26/2021 Medical Record Number: [id number] Patient Account Number: [id number] Date of Birth/Sex: Treating RN: 1941/02/08 (81 y.o. 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 limited to breakdown of skin  I89.0 Lymphedema, not elsewhere classified Facility Procedures CPT4 Code: [id number] Description: [id number] - WOUND CARE VISIT-LEV 3 EST PT Modifier: Quantity: 1 Physician Procedures : CPT4 Code Description Modifier [id number] [phone] - WC PHYS LEVEL 2 - EST PT ICD-10 Diagnosis Description I87.332 Chronic venous hypertension (idiopathic) with ulcer and inflammation of left lower extremity L03.116 Cellulitis of left lower limb L97.921  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

## 2021-02-26 NOTE — Progress Notes (Signed)
[name], BEAM ([id number]) Visit Report for 02/26/2021 Arrival Information Details Patient Name: Date of Service: Barbara Lucero 02/26/2021 2:00 PM Medical Record Number: [id number] Patient Account Number: [id number] Date of Birth/Sex: Treating RN: 1940-10-04 (81 y.o. Barbara Lucero, Barbara Lucero Primary Care Easten Maceachern: [name] Other Clinician: Referring Savalas Monje: Treating Katee Wentland/Extender: [name] in Treatment: 26 Visit Information History Since Last Visit Added or deleted any medications: No Patient Arrived: Walker Any new allergies or adverse reactions: No Arrival Time: 14:07 Had a fall or experienced change in No Accompanied By: sister activities of daily living that may affect Transfer Assistance: None risk of falls: Patient Identification Verified: Yes Signs or symptoms of abuse/neglect since last visito No Secondary Verification Process Completed: Yes Hospitalized since last visit: No Patient Requires Transmission-Based Precautions: No Implantable device outside of the clinic excluding No Patient Has Alerts: No cellular tissue based products placed in the center since last visit: Has Dressing in Place as Prescribed: Yes Has Compression in Place as Prescribed: Yes Pain Present Now: No Electronic Signature(s) Signed: 02/26/2021 5:47:12 PM By: Zandra Abts RN, BSN Entered By: Zandra Abts on 02/26/2021 14:07:47 -------------------------------------------------------------------------------- Clinic Level of Care Assessment Details Patient Name: Date of Service: Barbara Sheets B. 02/26/2021 2:00 PM Medical Record Number: [id number] Patient Account Number: [id number] Date of Birth/Sex: Treating RN: 04-02-1940 (81 y.o. Barbara Lucero Primary Care Shikara Mcauliffe: [name] Other Clinician: Referring Adena Sima: Treating Caran Storck/Extender: [name] in Treatment: 26 Clinic Level of Care Assessment Items TOOL 4 Quantity Score X- 1 0 Use  when only an EandM is performed on FOLLOW-UP visit ASSESSMENTS - Nursing Assessment / Reassessment X- 1 10 Reassessment of Co-morbidities (includes updates in patient status) X- 1 5 Reassessment of Adherence to Treatment Plan ASSESSMENTS - Wound and Skin A ssessment / Reassessment X - Simple Wound Assessment / Reassessment - one wound 1 5  - 0 Complex Wound Assessment / Reassessment - multiple wounds  - 0 Dermatologic / Skin Assessment (not related to wound area) ASSESSMENTS - Focused Assessment  - 0 Circumferential Edema Measurements - multi extremities  - 0 Nutritional Assessment / Counseling / Intervention X- 1 5 Lower Extremity Assessment (monofilament, tuning fork, pulses)  - 0 Peripheral Arterial Disease Assessment (using hand held doppler) ASSESSMENTS - Ostomy and/or Continence Assessment and Care  - 0 Incontinence Assessment and Management  - 0 Ostomy Care Assessment and Management (repouching, etc.) PROCESS - Coordination of Care X - Simple Patient / Family Education for ongoing care 1 15  - 0 Complex (extensive) Patient / Family Education for ongoing care X- 1 10 Staff obtains Chiropractor, Records, T Results / Process Orders est  - 0 Staff telephones HHA, Nursing Homes / Clarify orders / etc  - 0 Routine Transfer to another Facility (non-emergent condition)  - 0 Routine Hospital Admission (non-emergent condition)  - 0 New Admissions / Manufacturing engineer / Ordering NPWT Apligraf, etc. ,  - 0 Emergency Hospital Admission (emergent condition) X- 1 10 Simple Discharge Coordination  - 0 Complex (extensive) Discharge Coordination PROCESS - Special Needs  - 0 Pediatric / Minor Patient Management  - 0 Isolation Patient Management  - 0 Hearing / Language / Visual special needs  - 0 Assessment of Community assistance (transportation, D/C planning, etc.)  - 0 Additional assistance / Altered mentation  - 0 Support  Surface(s) Assessment (bed, cushion, seat, etc.) INTERVENTIONS - Wound Cleansing / Measurement X - Simple Wound Cleansing - one wound 1 5  -  0 Complex Wound Cleansing - multiple wounds X- 1 5 Wound Imaging (photographs - any number of wounds) []  - 0 Wound Tracing (instead of photographs) X- 1 5 Simple Wound Measurement - one wound []  - 0 Complex Wound Measurement - multiple wounds INTERVENTIONS - Wound Dressings []  - 0 Small Wound Dressing one or multiple wounds []  - 0 Medium Wound Dressing one or multiple wounds []  - 0 Large Wound Dressing one or multiple wounds []  - 0 Application of Medications - topical []  - 0 Application of Medications - injection INTERVENTIONS - Miscellaneous []  - 0 External ear exam []  - 0 Specimen Collection (cultures, biopsies, blood, body fluids, etc.) []  - 0 Specimen(s) / Culture(s) sent or taken to Lab for analysis []  - 0 Patient Transfer (multiple staff / Nurse, adult / Similar devices) []  - 0 Simple Staple / Suture removal (25 or less) []  - 0 Complex Staple / Suture removal (26 or more) []  - 0 Hypo / Hyperglycemic Management (close monitor of Blood Glucose) []  - 0 Ankle / Brachial Index (ABI) - do not check if billed separately X- 1 5 Vital Signs Has the patient been seen at the hospital within the last three years: Yes Total Score: 80 Level Of Care: New/Established - Level 3 Electronic Signature(s) Signed: 02/26/2021 5:47:12 PM By: Zandra Abts RN, BSN Entered By: Zandra Abts on 02/26/2021 14:50:23 -------------------------------------------------------------------------------- Encounter Discharge Information Details Patient Name: Date of Service: Barbara Sheets B. 02/26/2021 2:00 PM Medical Record Number: [id number] Patient Account Number: [id number] Date of Birth/Sex: Treating RN: 1940-07-27 (81 y.o. Barbara Lucero Primary Care Norvil Martensen: [name] Other Clinician: Referring Anis Degidio: Treating Cadel Stairs/Extender: [name] in Treatment: 26 Encounter Discharge Information Items Discharge Condition: Stable Ambulatory Status: Walker Discharge Destination: Home Transportation: Private Auto Accompanied By: sister Schedule Follow-up Appointment: Yes Clinical Summary of Care: Patient Declined Electronic Signature(s) Signed: 02/26/2021 5:47:12 PM By: Zandra Abts RN, BSN Entered By: Zandra Abts on 02/26/2021 14:51:11 -------------------------------------------------------------------------------- Lower Extremity Assessment Details Patient Name: Date of Service: Barbara Sheets B. 02/26/2021 2:00 PM Medical Record Number: [id number] Patient Account Number: [id number] Date of Birth/Sex: Treating RN: [birth year] (81 y.o. Barbara Lucero Primary Care Shavanna Furnari: [name] Other Clinician: Referring Airik Goodlin: Treating Kensley Lares/Extender: [name] in Treatment: 26 Edema Assessment Assessed: [name]: No] [name]: No] Edema: [Left: Ye] [Right: s] Calf Left: Right: Point of Measurement: 29 cm From Medial Instep 35 cm Ankle Left: Right: Point of Measurement: 10 cm From Medial Instep 21 cm Vascular Assessment Pulses: Dorsalis Pedis Palpable: [Left:Yes] Electronic Signature(s) Signed: 02/26/2021 5:47:12 PM By: Zandra Abts RN, BSN Entered By: Zandra Abts on 02/26/2021 14:10:14 -------------------------------------------------------------------------------- Multi Wound Chart Details Patient Name: Date of Service: Barbara Sheets B. 02/26/2021 2:00 PM Medical Record Number: [id number] Patient Account Number: [id number] Date of Birth/Sex: Treating RN: 1940-09-19 (81 y.o. F) Primary Care Tyishia Aune: [name] Other Clinician: Referring Carollynn Pennywell: Treating Floyed Masoud/Extender: [name] in Treatment: 26 Vital Signs Height(in): 63 Pulse(bpm): 75 Weight(lbs): 182 Blood Pressure(mmHg): 150/84 Body Mass Index(BMI): 32 Temperature(F):  98.0 Respiratory Rate(breaths/min): 16 Photos: [N/A:N/A] Left, Lateral Ankle N/A N/A Wound Location: Blister N/A N/A Wounding Event: Venous Leg Ulcer N/A N/A Primary Etiology: Cataracts, Deep Vein Thrombosis, N/A N/A Comorbid History: Peripheral Venous Disease, Crohns, Osteoarthritis, Confinement Anxiety 11/26/2020 N/A N/A Date Acquired: 12 N/A N/A Weeks of Treatment: Open N/A N/A Wound Status: 0x0x0 N/A N/A Measurements L x W x D (cm) 0 N/A N/A A (  cm) : rea 0 N/A N/A Volume (cm) : 100.00% N/A N/A % Reduction in Area: 100.00% N/A N/A % Reduction in Volume: Full Thickness Without Exposed N/A N/A Classification: Support Structures None Present N/A N/A Exudate Amount: Distinct, outline attached N/A N/A Wound Margin: None Present (0%) N/A N/A Granulation Amount: None Present (0%) N/A N/A Necrotic Amount: Fascia: No N/A N/A Exposed Structures: Fat Layer (Subcutaneous Tissue): No Tendon: No Muscle: No Joint: No Bone: No Large (67-100%) N/A N/A Epithelialization: Treatment Notes Electronic Signature(s) Signed: 02/26/2021 3:57:23 PM By: Baltazar Najjar MD Entered By: Baltazar Najjar on 02/26/2021 14:39:31 -------------------------------------------------------------------------------- Multi-Disciplinary Care Plan Details Patient Name: Date of Service: Barbara Sheets B. 02/26/2021 2:00 PM Medical Record Number: [id number] Patient Account Number: [id number] Date of Birth/Sex: Treating RN: 27-Jan-1941 (81 y.o. Barbara Lucero Primary Care Brice Kossman: [name] Other Clinician: Referring Dyanne Yorks: Treating Glynna Failla/Extender: [name] in Treatment: 26 Multidisciplinary Care Plan reviewed with physician Active Inactive Electronic Signature(s) Signed: 02/26/2021 5:47:12 PM By: Zandra Abts RN, BSN Entered By: Zandra Abts on 02/26/2021 14:25:03 -------------------------------------------------------------------------------- Pain  Assessment Details Patient Name: Date of Service: Barbara Sheets B. 02/26/2021 2:00 PM Medical Record Number: [id number] Patient Account Number: [id number] Date of Birth/Sex: Treating RN: Jun 02, 1940 (81 y.o. Barbara Lucero Primary Care Idil Maslanka: [name] Other Clinician: Referring Angus Amini: Treating Ramin Zoll/Extender: [name] in Treatment: 26 Active Problems Location of Pain Severity and Description of Pain Patient Has Paino No Site Locations Pain Management and Medication Current Pain Management: Electronic Signature(s) Signed: 02/26/2021 5:47:12 PM By: Zandra Abts RN, BSN Entered By: Zandra Abts on 02/26/2021 14:08:13 -------------------------------------------------------------------------------- Patient/Caregiver Education Details Patient Name: Date of Service: Barbara Lucero 1/3/2023andnbsp2:00 PM Medical Record Number: [id number] Patient Account Number: [id number] Date of Birth/Gender: Treating RN: June 12, 1940 (81 y.o. Barbara Lucero Primary Care Physician: [name] Other Clinician: Referring Physician: Treating Physician/Extender: [name] in Treatment: 26 Education Assessment Education Provided To: Patient Education Topics Provided Wound/Skin Impairment: Methods: Explain/Verbal Responses: State content correctly [employer]) Signed: 02/26/2021 5:47:12 PM By: Zandra Abts RN, BSN Entered By: Zandra Abts on 02/26/2021 14:25:36 -------------------------------------------------------------------------------- Wound Assessment Details Patient Name: Date of Service: Barbara Sheets B. 02/26/2021 2:00 PM Medical Record Number: [id number] Patient Account Number: [id number] Date of Birth/Sex: Treating RN: [birth year] (81 y.o. [name], [name] Primary Care Shamere Dilworth: [name] Other Clinician: Referring Jaymond Waage: Treating Shamina Etheridge/Extender: [name] in Treatment:  26 Wound Status Wound Number: 4 Primary Venous Leg Ulcer Etiology: Wound Location: Left, Lateral Ankle Wound Open Wounding Event: Blister Status: Date Acquired: 11/26/2020 Comorbid Cataracts, Deep Vein Thrombosis, Peripheral Venous Disease, Weeks Of Treatment: 12 History: Crohns, Osteoarthritis, Confinement Anxiety Clustered Wound: No Photos Wound Measurements Length: (cm) Width: (cm) Depth: (cm) Area: (cm) Volume: (cm) 0 % Reduction in Area: 100% 0 % Reduction in Volume: 100% 0 Epithelialization: Large (67-100%) 0 Tunneling: No 0 Undermining: No Wound Description Classification: Full Thickness Without Exposed Support Structures Wound Margin: Distinct, outline attached Exudate Amount: None Present Foul Odor After Cleansing: No Slough/Fibrino No Wound Bed Granulation Amount: None Present (0%) Exposed Structure Necrotic Amount: None Present (0%) Fascia Exposed: No Fat Layer (Subcutaneous Tissue) Exposed: No Tendon Exposed: No Muscle Exposed: No Joint Exposed: No Bone Exposed: No Electronic Signature(s) Signed: 02/26/2021 4:35:58 PM By: Fonnie Mu RN Entered By: Fonnie Mu on 02/26/2021 14:11:22 -------------------------------------------------------------------------------- Vitals Details Patient Name: Date of Service: Barbara Sheets B. 02/26/2021 2:00 PM Medical Record  Number: [id number] Patient Account Number: [id number] Date of Birth/Sex: Treating RN: 07-11-1940 (81 y.o. Barbara Lucero Primary Care Marelly Wehrman: [name] Other Clinician: Referring Billal Rollo: Treating Fidencia Mccloud/Extender: [name] in Treatment: 26 Vital Signs Time Taken: 14:07 Temperature (F): 98.0 Height (in): 63 Pulse (bpm): 75 Weight (lbs): 182 Respiratory Rate (breaths/min): 16 Body Mass Index (BMI): 32.2 Blood Pressure (mmHg): 150/84 Reference Range: 80 - 120 mg / dl Electronic Signature(s) Signed: 02/26/2021 5:47:12 PM By: Zandra Abts RN,  BSN Entered By: Zandra Abts on 02/26/2021 14:08:06

## 2021-03-05 ENCOUNTER — Ambulatory Visit (HOSPITAL_COMMUNITY): Payer: Medicare Other

## 2021-03-14 NOTE — Progress Notes (Signed)
[name], RISING ([id number]) Visit Report for 02/12/2021 Arrival Information Details Patient Name: Date of Service: Barbara Lucero 02/12/2021 1:30 PM Medical Record Number: [id number] Patient Account Number: [id number] Date of Birth/Sex: Treating RN: 05/24/1940 (81 y.o. Barbara Lucero, Barbara Lucero Primary Care Takeela Peil: [name] Other Clinician: Referring Breleigh Carpino: Treating Jennie Bolar/Extender: [name] in Treatment: 24 Visit Information History Since Last Visit Added or deleted any medications: No Patient Arrived: [name] Any new allergies or adverse reactions: No Arrival Time: 13:27 Had a fall or experienced change in No Accompanied By: sister activities of daily living that may affect Transfer Assistance: None risk of falls: Patient Identification Verified: Yes Signs or symptoms of abuse/neglect since last visito No Secondary Verification Process Completed: Yes Hospitalized since last visit: No Patient Requires Transmission-Based Precautions: No Implantable device outside of the clinic excluding No Patient Has Alerts: No cellular tissue based products placed in the center since last visit: Has Dressing in Place as Prescribed: Yes Pain Present Now: No Electronic Signature(s) Signed: 02/14/2021 9:32:36 AM By: Karl Ito Entered By: Karl Ito on 02/12/2021 13:28:01 -------------------------------------------------------------------------------- Clinic Level of Care Assessment Details Patient Name: Date of Service: Barbara Lucero 02/12/2021 1:30 PM Medical Record Number: [id number] Patient Account Number: [id number] Date of Birth/Sex: Treating RN: 1941/01/19 (81 y.o. Barbara Lucero, Barbara Lucero Primary Care Lan Entsminger: [name] Other Clinician: Referring Jonnette Nuon: Treating Anaijah Augsburger/Extender: [name] in Treatment: 24 Clinic Level of Care Assessment Items TOOL 4 Quantity Score X- 1 0 Use when only an EandM is performed on  FOLLOW-UP visit ASSESSMENTS - Nursing Assessment / Reassessment X- 1 10 Reassessment of Co-morbidities (includes updates in patient status) X- 1 5 Reassessment of Adherence to Treatment Plan ASSESSMENTS - Wound and Skin A ssessment / Reassessment X - Simple Wound Assessment / Reassessment - one wound 1 5 []  - 0 Complex Wound Assessment / Reassessment - multiple wounds []  - 0 Dermatologic / Skin Assessment (not related to wound area) ASSESSMENTS - Focused Assessment X- 1 5 Circumferential Edema Measurements - multi extremities []  - 0 Nutritional Assessment / Counseling / Intervention []  - 0 Lower Extremity Assessment (monofilament, tuning fork, pulses) []  - 0 Peripheral Arterial Disease Assessment (using hand held doppler) ASSESSMENTS - Ostomy and/or Continence Assessment and Care []  - 0 Incontinence Assessment and Management []  - 0 Ostomy Care Assessment and Management (repouching, etc.) PROCESS - Coordination of Care X - Simple Patient / Family Education for ongoing care 1 15 []  - 0 Complex (extensive) Patient / Family Education for ongoing care X- 1 10 Staff obtains , Records, T Results / Process Orders est []  - 0 Staff telephones HHA, Nursing Homes / Clarify orders / etc []  - 0 Routine Transfer to another Facility (non-emergent condition) []  - 0 Routine Hospital Admission (non-emergent condition) []  - 0 New Admissions / / Ordering NPWT Apligraf, etc. , []  - 0 Emergency Hospital Admission (emergent condition) X- 1 10 Simple Discharge Coordination []  - 0 Complex (extensive) Discharge Coordination PROCESS - Special Needs []  - 0 Pediatric / Minor Patient Management []  - 0 Isolation Patient Management []  - 0 Hearing / Language / Visual special needs []  - 0 Assessment of Community assistance (transportation, D/C planning, etc.) []  - 0 Additional assistance / Altered mentation []  - 0 Support Surface(s) Assessment (bed, cushion,  seat, etc.) INTERVENTIONS - Wound Cleansing / Measurement X - Simple Wound Cleansing - one wound 1 5 []  - 0 Complex Wound Cleansing - multiple wounds  X- 1 5 Wound Imaging (photographs - any number of wounds)  - 0 Wound Tracing (instead of photographs) X- 1 5 Simple Wound Measurement - one wound  - 0 Complex Wound Measurement - multiple wounds INTERVENTIONS - Wound Dressings  - 0 Small Wound Dressing one or multiple wounds X- 1 15 Medium Wound Dressing one or multiple wounds  - 0 Large Wound Dressing one or multiple wounds X- 1 5 Application of Medications - topical  - 0 Application of Medications - injection INTERVENTIONS - Miscellaneous  - 0 External ear exam  - 0 Specimen Collection (cultures, biopsies, blood, body fluids, etc.)  - 0 Specimen(s) / Culture(s) sent or taken to Lab for analysis  - 0 Patient Transfer (multiple staff / Nurse, adult / Similar devices)  - 0 Simple Staple / Suture removal (25 or less)  - 0 Complex Staple / Suture removal (26 or more)  - 0 Hypo / Hyperglycemic Management (close monitor of Blood Glucose)  - 0 Ankle / Brachial Index (ABI) - do not check if billed separately X- 1 5 Vital Signs Has the patient been seen at the hospital within the last three years: Yes Total Score: 100 Level Of Care: New/Established - Level 3 Electronic Signature(s) Signed: 03/14/2021 12:37:40 PM By: Fonnie Mu RN Entered By: Fonnie Mu on 02/12/2021 14:46:52 -------------------------------------------------------------------------------- Encounter Discharge Information Details Patient Name: Date of Service: Barbara Sheets B. 02/12/2021 1:30 PM Medical Record Number: [id number] Patient Account Number: [id number] Date of Birth/Sex: Treating RN: 08/04/1940 (81 y.o. Barbara Lucero, Barbara Lucero Primary Care Alante Weimann: [name] Other Clinician: Referring Barbara Lucero: Treating Barbara Lucero/Extender: [name] in  Treatment: 24 Encounter Discharge Information Items Discharge Condition: Stable Ambulatory Status: Ambulatory Discharge Destination: Home Transportation: Private Auto Accompanied By: friend Schedule Follow-up Appointment: Yes Clinical Summary of Care: Patient Declined Electronic Signature(s) Signed: 03/14/2021 12:37:40 PM By: Fonnie Mu RN Entered By: Fonnie Mu on 02/12/2021 14:52:38 -------------------------------------------------------------------------------- Lower Extremity Assessment Details Patient Name: Date of Service: Barbara Sheets B. 02/12/2021 1:30 PM Medical Record Number: [id number] Patient Account Number: [id number] Date of Birth/Sex: Treating RN: 10/12/1940 (81 y.o. Barbara Lucero, Barbara Lucero Primary Care Anderson Middlebrooks: [name] Other Clinician: Referring Brinley Treanor: Treating Susy Placzek/Extender: [name] in Treatment: 24 Edema Assessment Assessed: [name]: Yes] [name]: No] Edema: [Left: Ye] [Right: s] Calf Left: Right: Point of Measurement: 29 cm From Medial Instep 35 cm Ankle Left: Right: Point of Measurement: 10 cm From Medial Instep 21 cm Vascular Assessment Pulses: Dorsalis Pedis Palpable: [Left:Yes] Posterior Tibial Palpable: [Left:Yes] Electronic Signature(s) Signed: 03/14/2021 12:37:40 PM By: Fonnie Mu RN Entered By: Fonnie Mu on 02/12/2021 14:13:54 -------------------------------------------------------------------------------- Multi Wound Chart Details Patient Name: Date of Service: Barbara Sheets B. 02/12/2021 1:30 PM Medical Record Number: [id number] Patient Account Number: [id number] Date of Birth/Sex: Treating RN: 07/17/1940 (81 y.o. [name] Primary Care Jahlen Bollman: [name] Other Clinician: Referring Roshawn Lacina: Treating Jahmad Petrich/Extender: [name] in Treatment: 24 Vital Signs Height(in): 63 Pulse(bpm): 74 Weight(lbs): 182 Blood Pressure(mmHg): 180/75 Body Mass  Index(BMI): 32 Temperature(F): 98.0 Respiratory Rate(breaths/min): 16 Photos: [N/A:N/A] Left, Lateral Ankle N/A N/A Wound Location: Blister N/A N/A Wounding Event: Venous Leg Ulcer N/A N/A Primary Etiology: Cataracts, Deep Vein Thrombosis, N/A N/A Comorbid History: Peripheral Venous Disease, Crohns, Osteoarthritis, Confinement Anxiety 11/26/2020 N/A N/A Date Acquired: 10 N/A N/A Weeks of Treatment: Open N/A N/A Wound Status: 4.5x2x0.1 N/A N/A Measurements L x W x D (cm) 7.069 N/A N/A A (cm) : rea 0.707  N/A N/A Volume (cm) : -4402.50% N/A N/A % Reduction in Area: -4318.70% N/A N/A % Reduction in Volume: Full Thickness Without Exposed N/A N/A Classification: Support Structures Medium N/A N/A Exudate Amount: Purulent N/A N/A Exudate Type: yellow, brown, green N/A N/A Exudate Color: Distinct, outline attached N/A N/A Wound Margin: Large (67-100%) N/A N/A Granulation Amount: Pink N/A N/A Granulation Quality: Small (1-33%) N/A N/A Necrotic Amount: Fat Layer (Subcutaneous Tissue): Yes N/A N/A Exposed Structures: Fascia: No Tendon: No Muscle: No Joint: No Bone: No Medium (34-66%) N/A N/A Epithelialization: Treatment Notes Electronic Signature(s) Signed: 02/12/2021 4:29:35 PM By: Baltazar Najjar MD Signed: 02/12/2021 5:35:33 PM By: Zandra Abts RN, BSN Entered By: Baltazar Najjar on 02/12/2021 14:33:47 -------------------------------------------------------------------------------- Multi-Disciplinary Care Plan Details Patient Name: Date of Service: Barbara Sheets B. 02/12/2021 1:30 PM Medical Record Number: [id number] Patient Account Number: [id number] Date of Birth/Sex: Treating RN: [birth year] (81 y.o. Barbara Lucero, Barbara Lucero Primary Care Elgie Landino: [name] Other Clinician: Referring Derran Sear: Treating Karsen Fellows/Extender: [name] in Treatment: 24 Multidisciplinary Care Plan reviewed with physician Active Inactive Venous  Leg Ulcer Nursing Diagnoses: Potential for venous Insuffiency (use before diagnosis confirmed) Goals: Patient will maintain optimal edema control Date Initiated: 08/23/2020 Target Resolution Date: 02/23/2021 Goal Status: Active Interventions: Assess peripheral edema status every visit. Provide education on venous insufficiency Treatment Activities: Non-invasive vascular studies : 08/23/2020 T ordered outside of clinic : 08/23/2020 est Therapeutic compression applied : 08/23/2020 Notes: Wound/Skin Impairment Nursing Diagnoses: Knowledge deficit related to ulceration/compromised skin integrity Goals: Patient/caregiver will verbalize understanding of skin care regimen Date Initiated: 08/23/2020 Target Resolution Date: 02/22/2021 Goal Status: Active Ulcer/skin breakdown will have a volume reduction of 30% by week 4 Date Initiated: 10/01/2020 Date Inactivated: 12/17/2020 Target Resolution Date: 01/08/2021 Unmet Reason: see wound Goal Status: Unmet measurements. Interventions: Assess patient/caregiver ability to obtain necessary supplies Assess patient/caregiver ability to perform ulcer/skin care regimen upon admission and as needed Provide education on ulcer and skin care Treatment Activities: Skin care regimen initiated : 08/23/2020 Topical wound management initiated : 08/23/2020 Notes: Electronic Signature(s) Signed: 03/14/2021 12:37:40 PM By: Fonnie Mu RN Entered By: Fonnie Mu on 02/12/2021 14:44:50 -------------------------------------------------------------------------------- Pain Assessment Details Patient Name: Date of Service: Barbara Sheets B. 02/12/2021 1:30 PM Medical Record Number: [id number] Patient Account Number: [id number] Date of Birth/Sex: Treating RN: 1940/05/01 (81 y.o. [name] Primary Care [place] Brogden: [name] Other Clinician: Referring Yachet Mattson: Treating Carmeline Kowal/Extender: [name] in Treatment: 24 Active  Problems Location of Pain Severity and Description of Pain Patient Has Paino No Site Locations Pain Management and Medication Current Pain Management: Electronic Signature(s) Signed: 02/12/2021 5:35:33 PM By: Zandra Abts RN, BSN Signed: 02/14/2021 9:32:36 AM By: Karl Ito Entered By: Karl Ito on 02/12/2021 13:28:22 -------------------------------------------------------------------------------- Patient/Caregiver Education Details Patient Name: Date of Service: Barbara Lucero 12/20/2022andnbsp1:30 PM Medical Record Number: [id number] Patient Account Number: [id number] Date of Birth/Gender: Treating RN: 11-15-1940 (81 y.o. Barbara Lucero, Barbara Lucero Primary Care Physician: [name] Other Clinician: Referring Physician: Treating Physician/Extender: [name] in Treatment: 24 Education Assessment Education Provided To: Patient Education Topics Provided Wound/Skin Impairment: Methods: Explain/Verbal Responses: State content correctly [employer]) Signed: 03/14/2021 12:37:40 PM By: Fonnie Mu RN Entered By: Fonnie Mu on 02/12/2021 14:45:10 -------------------------------------------------------------------------------- Wound Assessment Details Patient Name: Date of Service: Barbara Sheets B. 02/12/2021 1:30 PM Medical Record Number: [id number] Patient Account Number: [id number] Date of Birth/Sex: Treating RN: Aug 03, 1940 (81 y.o. Toniann Fail Primary Care Sahian Kerney:  [name] Other Clinician: Referring Tayshon Winker: Treating Judeth Gilles/Extender: [name] in Treatment: 24 Wound Status Wound Number: 4 Primary Venous Leg Ulcer Etiology: Wound Location: Left, Lateral Ankle Wound Open Wounding Event: Blister Status: Date Acquired: 11/26/2020 Comorbid Cataracts, Deep Vein Thrombosis, Peripheral Venous Disease, Weeks Of Treatment: 10 History: Crohns, Osteoarthritis, Confinement  Anxiety Clustered Wound: No Photos Wound Measurements Length: (cm) 4.5 Width: (cm) 2 Depth: (cm) 0.1 Area: (cm) 7.069 Volume: (cm) 0.707 % Reduction in Area: -4402.5% % Reduction in Volume: -4318.7% Epithelialization: Medium (34-66%) Wound Description Classification: Full Thickness Without Exposed Support Structures Wound Margin: Distinct, outline attached Exudate Amount: Medium Exudate Type: Purulent Exudate Color: yellow, brown, green Foul Odor After Cleansing: No Slough/Fibrino Yes Wound Bed Granulation Amount: Large (67-100%) Exposed Structure Granulation Quality: Pink Fascia Exposed: No Necrotic Amount: Small (1-33%) Fat Layer (Subcutaneous Tissue) Exposed: Yes Necrotic Quality: Adherent Slough Tendon Exposed: No Muscle Exposed: No Joint Exposed: No Bone Exposed: No Electronic Signature(s) Signed: 03/14/2021 12:37:40 PM By: Fonnie Mu RN Entered By: Fonnie Mu on 02/12/2021 13:34:44 -------------------------------------------------------------------------------- Vitals Details Patient Name: Date of Service: Barbara Sheets B. 02/12/2021 1:30 PM Medical Record Number: [id number] Patient Account Number: [id number] Date of Birth/Sex: Treating RN: 07-Jul-1940 (81 y.o. [name] Primary Care Eisley Barber: [name] Other Clinician: Referring Damian Hofstra: Treating Tinsleigh Slovacek/Extender: [name] in Treatment: 24 Vital Signs Time Taken: 13:28 Temperature (F): 98.0 Height (in): 63 Pulse (bpm): 74 Weight (lbs): 182 Respiratory Rate (breaths/min): 16 Body Mass Index (BMI): 32.2 Blood Pressure (mmHg): 180/75 Reference Range: 80 - 120 mg / dl Electronic Signature(s) Signed: 02/14/2021 9:32:36 AM By: Karl Ito Entered By: Karl Ito on 02/12/2021 13:28:17

## 2021-03-18 ENCOUNTER — Ambulatory Visit: Payer: Medicare Other | Admitting: Surgery

## 2021-03-26 DIAGNOSIS — E785 Hyperlipidemia, unspecified: Secondary | ICD-10-CM | POA: Diagnosis not present

## 2021-03-26 DIAGNOSIS — R7303 Prediabetes: Secondary | ICD-10-CM | POA: Diagnosis not present

## 2021-03-26 DIAGNOSIS — I87309 Chronic venous hypertension (idiopathic) without complications of unspecified lower extremity: Secondary | ICD-10-CM | POA: Diagnosis not present

## 2021-03-29 DIAGNOSIS — I10 Essential (primary) hypertension: Secondary | ICD-10-CM | POA: Diagnosis not present

## 2021-04-02 DIAGNOSIS — M25519 Pain in unspecified shoulder: Secondary | ICD-10-CM | POA: Diagnosis not present

## 2021-04-02 DIAGNOSIS — I872 Venous insufficiency (chronic) (peripheral): Secondary | ICD-10-CM | POA: Diagnosis not present

## 2021-04-02 DIAGNOSIS — R011 Cardiac murmur, unspecified: Secondary | ICD-10-CM | POA: Diagnosis not present

## 2021-04-02 DIAGNOSIS — R7303 Prediabetes: Secondary | ICD-10-CM | POA: Diagnosis not present

## 2021-04-02 DIAGNOSIS — E785 Hyperlipidemia, unspecified: Secondary | ICD-10-CM | POA: Diagnosis not present

## 2021-04-02 DIAGNOSIS — I700 Atherosclerosis of aorta: Secondary | ICD-10-CM | POA: Diagnosis not present

## 2021-04-04 ENCOUNTER — Other Ambulatory Visit: Payer: Self-pay | Admitting: Obstetrics & Gynecology

## 2021-04-09 ENCOUNTER — Ambulatory Visit (HOSPITAL_COMMUNITY)
Admission: RE | Admit: 2021-04-09 | Discharge: 2021-04-09 | Disposition: A | Discharge status: Home / Self Care | Payer: Medicare Other | Source: Ambulatory Visit | Attending: Cardiovascular Disease | Admitting: Cardiovascular Disease

## 2021-04-09 DIAGNOSIS — I350 Nonrheumatic aortic (valve) stenosis: Secondary | ICD-10-CM | POA: Insufficient documentation

## 2021-04-09 LAB — ECHOCARDIOGRAM COMPLETE
AR max vel: 0.78 cm2
AV Area VTI: 0.69 cm2
AV Area mean vel: 0.64 cm2
AV Mean grad: 37.5 mmHg
AV Peak grad: 59.6 mmHg
Ao pk vel: 3.86 m/s
Area-P 1/2: 3.53 cm2
S' Lateral: 3.1 cm

## 2021-04-09 NOTE — Progress Notes (Signed)
*  PRELIMINARY RESULTS* Echocardiogram 2D Echocardiogram has been performed.  Barbara Lucero 04/09/2021, 12:29 PM

## 2021-04-10 NOTE — Progress Notes (Incomplete)
CARDIOLOGY CONSULT NOTE       Patient ID: Barbara Lucero MRN: [id number] DOB/AGE: [birth year] 79 y.o.  Referring Physician: Juanetta Gosling Primary Physician: [name], MD Primary Cardiologist: Eden Emms      HPI:  81 y.o. with history of Anxiety, depression Crohn disease and arthritis Referred by Dr Juanetta Gosling for irregular pulse and murmur on 08/09/19  She sees Dr Myra Gianotti for DVT f/u. Unprovoked in left leg June 2019 Appears to have been left on eliquis since that time  However appears that DVT occurred after hip surgery by history Lost her husband  2021  and depressed Previously on Doxepin Apparently an insurance nurse came to her house and told her she had a heart murmur Quit smoking 20 years ago  She has GI issues with Chohn's on sulfasalazine and reflux on protonix  She has had a hard life. She has a 74 yo son with CP who is bed bound now with supra pubic catheter she cares For by herself. She has a daughter with bi polar disease living with her and 69 yo grand daughter showing signs of mental health disease   TTE 08/01/20 moderate AS mean gradient 33 peak 52 DVI 0.36 AVA 1.1 cm2 TTE 04/09/21 moderate/severe AS mean gradient 38 mmHg peak 59.6 mmHg DVI 0.27 AVA -.8 cm2    ***  ROS All other systems reviewed and negative except as noted above  Past Medical History:  Diagnosis Date   Anxiety    Arthritis    Crohn disease (HCC)    Depression    Varicose veins of bilateral lower extremities with other complications     Family History  Problem Relation Age of Onset   Breast cancer Sister    Breast cancer Sister    Other Son        disabled   Bipolar disorder Daughter     Social History   Socioeconomic History   Marital status: Widowed    Spouse name: Not on file   Number of children: Not on file   Years of education: Not on file   Highest education level: Not on file  Occupational History   Not on file  Tobacco Use   Smoking status: Never   Smokeless tobacco:  Never  Vaping Use   Vaping Use: Never used  Substance and Sexual Activity   Alcohol use: No   Drug use: No   Sexual activity: Not Currently    Birth control/protection: Post-menopausal  Other Topics Concern   Not on file  Social History Narrative   Not on file   Social Determinants of Health   Financial Resource Strain: Not on file  Food Insecurity: Not on file  Transportation Needs: Not on file  Physical Activity: Not on file  Stress: Not on file  Social Connections: Not on file  Intimate Partner Violence: Not on file    Past Surgical History:  Procedure Laterality Date   BOWEL RESECTION     HIP ARTHROPLASTY Left 06/06/2016   Procedure: ARTHROPLASTY BIPOLAR HIP (HEMIARTHROPLASTY);  Surgeon: Vickki Hearing, MD;  Location: [facility];  Service: Orthopedics;  Laterality: Left;   lt hip replacement        Current Outpatient Medications:    acetaminophen (TYLENOL) 650 MG CR tablet, Take 650 mg by mouth every 8 (eight) hours as needed for pain., Disp: , Rfl:    Ascorbic Acid (VITAMIN C) 1000 MG tablet, Take 1,000 mg by mouth. , Disp: , Rfl:    b complex  vitamins tablet, Take 1 tablet by mouth daily., Disp: , Rfl:    CALCIUM PO, Take 1 tablet by mouth daily., Disp: , Rfl:    COLLAGEN PO, Take by mouth as directed., Disp: , Rfl:    furosemide (LASIX) 20 MG tablet, Take 20 mg by mouth daily., Disp: , Rfl:    gabapentin (NEURONTIN) 100 MG capsule, Take 100 mg by mouth 3 (three) times daily., Disp: , Rfl:    mirabegron ER (MYRBETRIQ) 50 MG TB24 tablet, Take 1 tablet (50 mg total) by mouth daily. At bedtime, Disp: 30 tablet, Rfl: 11   Multiple Vitamin (MULTIVITAMIN) tablet, Take 1 tablet by mouth daily., Disp: , Rfl:    Nystatin (GERHARDT'S BUTT CREAM) CREA, Apply 1 application topically 3 (three) times daily., Disp: 1 each, Rfl: 11   pantoprazole (PROTONIX) 40 MG tablet, Take 40 mg by mouth daily. , Disp: , Rfl:    potassium chloride 20 MEQ/15ML (10%) SOLN,  SMARTSIG:Milliliter(s) By Mouth, Disp: , Rfl:    Psyllium (METAMUCIL PO), Take by mouth as directed. , Disp: , Rfl:    sertraline (ZOLOFT) 25 MG tablet, Take 25 mg by mouth as directed., Disp: , Rfl:    simvastatin (ZOCOR) 10 MG tablet, Take 10 mg by mouth at bedtime., Disp: , Rfl:    sulfaSALAzine (AZULFIDINE) 500 MG tablet, Take 500 mg by mouth 2 (two) times daily. , Disp: , Rfl:    vitamin E 400 UNIT capsule, Take 400 Units by mouth daily., Disp: , Rfl:     Physical Exam: There were no vitals taken for this visit.   Affect appropriate Walks with walker  Obese white female  HEENT: normal Neck supple with no adenopathy JVP normal bilateral bruits vs transmitted murmur  no thyromegaly Lungs clear with no wheezing and good diaphragmatic motion Heart:  S1/S2 3/6 SEM over aortic area  murmur, no rub, gallop or click PMI normal Abdomen: benighn, BS positve, no tenderness, no AAA no bruit.  No HSM or HJR Distal pulses intact with no bruits Bilateral edema L>R  Neuro non-focal Skin warm and dry No muscular weakness   Labs:   Lab Results  Component Value Date   WBC 11.1 (H) 06/09/2016   HGB 11.0 (L) 06/09/2016   HCT 34.9 (L) 06/09/2016   MCV 92.8 06/09/2016   PLT 153 06/09/2016   No results for input(s): NA, K, CL, CO2, BUN, CREATININE, CALCIUM, PROT, BILITOT, ALKPHOS, ALT, AST, GLUCOSE in the last 168 hours.  Invalid input(s): LABALBU No results found for: CKTOTAL, CKMB, CKMBINDEX, TROPONINI No results found for: CHOL No results found for: HDL No results found for: LDLCALC No results found for: TRIG No results found for: CHOLHDL No results found for: LDLDIRECT    Radiology: ECHOCARDIOGRAM COMPLETE  Result Date: 04/09/2021    ECHOCARDIOGRAM REPORT   Patient Name:   Barbara Lucero Date of Exam: 04/09/2021 Medical Rec #:  [id number]     Height:       64.0 in Accession #:    [id number]    Weight:       184.0 lb Date of Birth:  02-06-1941      BSA:          1.888 m Patient  Age:    22 years      BP:           172/76 mmHg Patient Gender: F             HR:  80 bpm. Exam Location:  [facility] Procedure: 2D Echo, Cardiac Doppler and Color Doppler Indications:    I35.0 (ICD-10-CM) - Moderate aortic stenosis by prior                 echocardiogram  History:        Patient has prior history of Echocardiogram examinations, most                 recent 08/21/2020. Aortic Valve Disease, Signs/Symptoms:Murmur;                 Risk Factors:Dyslipidemia and Pre-diabetes.  Sonographer:    Alvino Chapel RCS Referring Phys: Henry  1. Left ventricular ejection fraction, by estimation, is 60 to 65%. The left ventricle has normal function. The left ventricle has no regional wall motion abnormalities. There is mild left ventricular hypertrophy. Left ventricular diastolic parameters are consistent with Grade I diastolic dysfunction (impaired relaxation).  2. Right ventricular systolic function is normal. The right ventricular size is normal.  3. Left atrial size was moderately dilated.  4. The mitral valve is normal in structure. No evidence of mitral valve regurgitation. No evidence of mitral stenosis.  5. Highest AV mean gradient is with Pedhoff probe measuring at 39 mmHg. Moderate to severe aortic stenosis much more favoring the severe end of the spectrum. AVA VTI 0.70 DI 0.27 mean grad 38 mmHg. . The aortic valve is tricuspid. There is mild calcification of the aortic valve. There is mild thickening of the aortic valve. Aortic valve regurgitation is not visualized.  6. The inferior vena cava is normal in size with greater than 50% respiratory variability, suggesting right atrial pressure of 3 mmHg. FINDINGS  Left Ventricle: Left ventricular ejection fraction, by estimation, is 60 to 65%. The left ventricle has normal function. The left ventricle has no regional wall motion abnormalities. The left ventricular internal cavity size was normal in size. There is  mild left  ventricular hypertrophy. Left ventricular diastolic parameters are consistent with Grade I diastolic dysfunction (impaired relaxation). Normal left ventricular filling pressure. Right Ventricle: The right ventricular size is normal. No increase in right ventricular wall thickness. Right ventricular systolic function is normal. Left Atrium: Left atrial size was moderately dilated. Right Atrium: Right atrial size was normal in size. Pericardium: There is no evidence of pericardial effusion. Mitral Valve: The mitral valve is normal in structure. No evidence of mitral valve regurgitation. No evidence of mitral valve stenosis. Tricuspid Valve: The tricuspid valve is normal in structure. Tricuspid valve regurgitation is trivial. No evidence of tricuspid stenosis. Aortic Valve: Highest AV mean gradient is with Pedhoff probe measuring at 39 mmHg. Moderate to severe aortic stenosis much more favoring the severe end of the spectrum. AVA VTI 0.70 DI 0.27 mean grad 38 mmHg. The aortic valve is tricuspid. There is mild calcification of the aortic valve. There is mild thickening of the aortic valve. There is mild aortic valve annular calcification. Aortic valve regurgitation is not visualized. Aortic valve mean gradient measures 37.5 mmHg. Aortic valve peak gradient measures 59.6 mmHg. Aortic valve area, by VTI measures 0.69 cm. Pulmonic Valve: The pulmonic valve was not well visualized. Pulmonic valve regurgitation is not visualized. No evidence of pulmonic stenosis. Aorta: The aortic root is normal in size and structure. Venous: The inferior vena cava is normal in size with greater than 50% respiratory variability, suggesting right atrial pressure of 3 mmHg. IAS/Shunts: No atrial level shunt detected by color flow Doppler.  LEFT VENTRICLE PLAX 2D LVIDd:         4.90 cm   Diastology LVIDs:         3.10 cm   LV e' medial:    6.09 cm/s LV PW:         1.15 cm   LV E/e' medial:  14.7 LV IVS:        1.20 cm   LV e' lateral:   7.62  cm/s LVOT diam:     1.80 cm   LV E/e' lateral: 11.7 LV SV:         71 LV SV Index:   38 LVOT Area:     2.54 cm  RIGHT VENTRICLE RV S prime:     12.30 cm/s TAPSE (M-mode): 2.2 cm LEFT ATRIUM             Index        RIGHT ATRIUM           Index LA diam:        3.65 cm 1.93 cm/m   RA Area:     17.60 cm LA Vol (A2C):   88.9 ml 47.08 ml/m  RA Volume:   50.40 ml  26.69 ml/m LA Vol (A4C):   71.2 ml 37.71 ml/m LA Biplane Vol: 86.4 ml 45.76 ml/m  AORTIC VALVE AV Area (Vmax):    0.78 cm AV Area (Vmean):   0.64 cm AV Area (VTI):     0.69 cm AV Vmax:           386.00 cm/s AV Vmean:          289.500 cm/s AV VTI:            1.025 m AV Peak Grad:      59.6 mmHg AV Mean Grad:      37.5 mmHg LVOT Vmax:         119.00 cm/s LVOT Vmean:        73.100 cm/s LVOT VTI:          0.279 m LVOT/AV VTI ratio: 0.27  AORTA Ao Root diam: 3.15 cm MITRAL VALVE MV Area (PHT): 3.53 cm     SHUNTS MV Decel Time: 215 msec     Systemic VTI:  0.28 m MV E velocity: 89.40 cm/s   Systemic Diam: 1.80 cm MV A velocity: 134.00 cm/s MV E/A ratio:  0.67 Carlyle Dolly MD Electronically signed by Carlyle Dolly MD Signature Date/Time: 04/09/2021/1:57:40 PM    Final      EKG: NSR normal ECG rate 64   ASSESSMENT AND PLAN:   1. DVT:  History unprovoked per Dr Trula Slade left on lifel long eliquis f/u VVS 2. Aortic Stenosis:  progressive now severe by calculated AVA and moderate to severe by DVI/and gradients *** 3. Bruit  Vs transmitted murmur carotid duplex 08/23/19 with plaque no stenosis  4. Crohns:  F/u GI continue azulfidine   Right and left heart cath  TAVR CTA Labs  F/U structural team after tests   Signed: Jenkins Rouge 04/10/2021, 3:43 PM

## 2021-04-11 ENCOUNTER — Ambulatory Visit: Payer: Medicare Other | Admitting: Cardiovascular Disease

## 2021-04-12 ENCOUNTER — Telehealth: Payer: Self-pay

## 2021-04-12 NOTE — Telephone Encounter (Signed)
-----   Message from Wendall Stade, MD sent at 04/09/2021  2:54 PM EST ----- AS now severe should f/u I [place] office to discuss She has not been seen since June 2021

## 2021-04-12 NOTE — Telephone Encounter (Signed)
Pt notified and verbalized understanding. Pt has appt 4/10 with Dr. Eden Emms in [place] office.

## 2021-05-29 NOTE — Progress Notes (Signed)
CARDIOLOGY CONSULT NOTE  ? ? ? ? ? ?Patient ID: ?Barbara Lucero ?MRN: [id number] ?DOB/AGE: 1940-08-27 81 y.o. ? ?Admit date: (Not on file) ?Referring Physician: Juanetta Gosling ?Primary Physician: [name], MD ?Primary Cardiologist: New ?Reason for Consultation: Murmur  ? ? ?HPI:  81 y.o. with history of Anxiety, depression Crohn disease and arthritis Referred by Dr Juanetta Gosling for irregular pulse and murmur on 08/09/19  She sees Dr Myra Gianotti for DVT f/u. Unprovoked in left leg June 2019 Appears to have been left on eliquis since that time  However appears that DVT occurred after hip surgery by history Lost her husband 2021 and depressed Previously on Doxepin Apparently an insurance nurse came to her house and told her she had a heart murmur Quit smoking 20 years ago  She has GI issues with Chohn's on sulfasalazine and reflux on protonix ? ?She has had a hard life. She has a 25 yo son with CP who is bed bound now with supra pubic catheter she cares For by herself. She has a daughter with bi polar disease living with her and 53 yo grand daughter showing signs of mental health disease  ? ?TTE 08/21/20 showed moderate AS with mean gradient 33 peak 53 mmHg  ?TTE 04/09/21 showed severe AS mean gradient 38 mmHg peak 60 mmHg AVA 0.78 cm2 and DVI 0.28 cm2 ? ?Long discussion with her about progressive AS. 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 Given lack of symptoms favor observation with echo in 6 months She understands that she will likely need TAVR evaluation within the next year or two at most. Symptoms of dyspnea, chest pain, syncope discussed  ? ?She is comfortable with observation at this time The process of TAVR evaluation including cath , CTA, and surgical consultation also discussed  ? ?ROS ?All other systems reviewed and negative except as noted above ? ?Past Medical History:  ?Diagnosis Date  ? Anxiety   ? Arthritis   ? Crohn disease (HCC)   ? Depression   ? Varicose veins of  bilateral lower extremities with other complications   ?  ?Family History  ?Problem Relation Age of Onset  ? Breast cancer Sister   ? Breast cancer Sister   ? Other Son   ?     disabled  ? Bipolar disorder Daughter   ?  ?Social History  ? ?Socioeconomic History  ? Marital status: Widowed  ?  Spouse name: Not on file  ? Number of children: Not on file  ? Years of education: Not on file  ? Highest education level: Not on file  ?Occupational History  ? Not on file  ?Tobacco Use  ? Smoking status: Never  ? Smokeless tobacco: Never  ?Vaping Use  ? Vaping Use: Never used  ?Substance and Sexual Activity  ? Alcohol use: No  ? Drug use: No  ? Sexual activity: Not Currently  ?  Birth control/protection: Post-menopausal  ?Other Topics Concern  ? Not on file  ?Social History Narrative  ? Not on file  ? ?Social Determinants of Health  ? ?Financial Resource Strain: Not on file  ?Food Insecurity: Not on file  ?Transportation Needs: Not on file  ?Physical Activity: Not on file  ?Stress: Not on file  ?Social Connections: Not on file  ?Intimate Partner Violence: Not on file  ?  ?Past Surgical History:  ?Procedure Laterality Date  ? BOWEL RESECTION    ? HIP ARTHROPLASTY Left 06/06/2016  ?  Procedure: ARTHROPLASTY BIPOLAR HIP (HEMIARTHROPLASTY);  Surgeon: Vickki Hearing, MD;  Location: [facility];  Service: Orthopedics;  Laterality: Left;  ? lt hip replacement    ?  ? ? ?Current Outpatient Medications:  ?  acetaminophen (TYLENOL) 650 MG CR tablet, Take 650 mg by mouth every 8 (eight) hours as needed for pain., Disp: , Rfl:  ?  Ascorbic Acid (VITAMIN C) 1000 MG tablet, Take 1,000 mg by mouth. , Disp: , Rfl:  ?  b complex vitamins tablet, Take 1 tablet by mouth daily., Disp: , Rfl:  ?  CALCIUM PO, Take 1 tablet by mouth daily., Disp: , Rfl:  ?  COLLAGEN PO, Take by mouth as directed., Disp: , Rfl:  ?  furosemide (LASIX) 20 MG tablet, Take 20 mg by mouth daily., Disp: , Rfl:  ?  gabapentin (NEURONTIN) 100 MG capsule, Take 100 mg by mouth 3  (three) times daily., Disp: , Rfl:  ?  mirabegron ER (MYRBETRIQ) 50 MG TB24 tablet, Take 1 tablet (50 mg total) by mouth daily. At bedtime, Disp: 30 tablet, Rfl: 11 ?  Multiple Vitamin (MULTIVITAMIN) tablet, Take 1 tablet by mouth daily., Disp: , Rfl:  ?  Nystatin (GERHARDT'S BUTT CREAM) CREA, Apply 1 application topically 3 (three) times daily., Disp: 1 each, Rfl: 11 ?  potassium chloride 20 MEQ/15ML (10%) SOLN, SMARTSIG:Milliliter(s) By Mouth, Disp: , Rfl:  ?  Psyllium (METAMUCIL PO), Take by mouth as directed. , Disp: , Rfl:  ?  sertraline (ZOLOFT) 25 MG tablet, Take 25 mg by mouth as directed., Disp: , Rfl:  ?  simvastatin (ZOCOR) 10 MG tablet, Take 10 mg by mouth at bedtime., Disp: , Rfl:  ?  sulfaSALAzine (AZULFIDINE) 500 MG tablet, Take 500 mg by mouth 2 (two) times daily. , Disp: , Rfl:  ?  vitamin E 400 UNIT capsule, Take 400 Units by mouth daily., Disp: , Rfl:  ?  pantoprazole (PROTONIX) 40 MG tablet, Take 40 mg by mouth daily.  (Patient not taking: Reported on 06/03/2021), Disp: , Rfl:  ?  solifenacin (VESICARE) 10 MG tablet, TAKE (1) TABLET BY MOUTH AT BEDTIME. (Patient not taking: Reported on 06/03/2021), Disp: 30 tablet, Rfl: 0 ? ? ? ?Physical Exam: ?Blood pressure 140/80, pulse 80, height 5\' 3"  (1.6 m), weight 192 lb (87.1 kg), SpO2 94 %.  ? ?Affect appropriate ?Walks with walker  ?Obese white female  ?HEENT: normal ?Neck supple with no adenopathy ?JVP normal bilateral bruits vs transmitted murmur  no thyromegaly ?Lungs clear with no wheezing and good diaphragmatic motion ?Heart:  S1/S2 diminished AS murmur no rub, gallop or click ?PMI normal ?Abdomen: benighn, BS positve, no tenderness, no AAA ?no bruit.  No HSM or HJR ?Distal pulses intact with no bruits ?Bilateral edema L>R  ?Neuro non-focal ?Skin warm and dry ?No muscular weakness ? ? ?Labs: ?  ?Lab Results  ?Component Value Date  ? WBC 11.1 (H) 06/09/2016  ? HGB 11.0 (L) 06/09/2016  ? HCT 34.9 (L) 06/09/2016  ? MCV 92.8 06/09/2016  ? PLT 153  06/09/2016  ? No results for input(s): NA, K, CL, CO2, BUN, CREATININE, CALCIUM, PROT, BILITOT, ALKPHOS, ALT, AST, GLUCOSE in the last 168 hours. ? ?Invalid input(s): LABALBU ?No results found for: CKTOTAL, CKMB, CKMBINDEX, TROPONINI No results found for: CHOL ?No results found for: HDL ?No results found for: LDLCALC ?No results found for: TRIG ?No results found for: CHOLHDL ?No results found for: LDLDIRECT  ?  ?Radiology: ?No results found. ? ? ?EKG:  NSR normal ECG rate 64 ? ? ?ASSESSMENT AND PLAN:  ? ?1. DVT:  History unprovoked per Dr Myra Gianotti left on lifel long eliquis f/u VVS ?2  AS:  progression to severe level see above Discussed issues regarding Rx with either TAVR/SAVR  Asymptomatic favors observation with echo in 6 months See discussion above  ?3. Bruit :  Vs transmitted murmur duplex 2021 plaque no stenosis  ?4. Crohns:  F/u GI continue azulfidine  ? ?TTE AS 6 months  ? ?F/U 6 months  ? ?Time:  spent complicated medical decision making natural history of AS, discussing SAVR/TAVR exam and composing note as well as reviewing echos 60 minutes  ? ?Signed: ?Charlton Haws ?06/03/2021, 2:47 PM ? ? ?

## 2021-06-03 ENCOUNTER — Ambulatory Visit: Payer: Medicare Other | Admitting: Cardiovascular Disease

## 2021-06-03 ENCOUNTER — Encounter: Payer: Self-pay | Admitting: Cardiovascular Disease

## 2021-06-03 VITALS — BP 140/80 | HR 80 | Ht 63.0 in | Wt 192.0 lb

## 2021-06-03 DIAGNOSIS — Z5181 Encounter for therapeutic drug level monitoring: Secondary | ICD-10-CM | POA: Diagnosis not present

## 2021-06-03 DIAGNOSIS — I739 Peripheral vascular disease, unspecified: Secondary | ICD-10-CM | POA: Diagnosis not present

## 2021-06-03 DIAGNOSIS — Z7901 Long term (current) use of anticoagulants: Secondary | ICD-10-CM | POA: Diagnosis not present

## 2021-06-03 DIAGNOSIS — I350 Nonrheumatic aortic (valve) stenosis: Secondary | ICD-10-CM | POA: Diagnosis not present

## 2021-06-03 DIAGNOSIS — R0989 Other specified symptoms and signs involving the circulatory and respiratory systems: Secondary | ICD-10-CM | POA: Diagnosis not present

## 2021-06-03 NOTE — Addendum Note (Signed)
Addended by: Kerney Elbe on: 06/03/2021 03:53 PM ? ? Modules accepted: Orders ? ?

## 2021-06-03 NOTE — Patient Instructions (Signed)
Testing/Procedures: °Your physician has requested that you have an echocardiogram. Echocardiography is a painless test that uses sound waves to create images of your heart. It provides your doctor with information about the size and shape of your heart and how well your heart’s chambers and valves are working. This procedure takes approximately one hour. There are no restrictions for this procedure. ° ° °Follow-Up: °Follow up with Dr Nishan in 6 months.  ° °Any Other Special Instructions Will Be Listed Below (If Applicable). ° ° ° ° °If you need a refill on your cardiac medications before your next appointment, please call your pharmacy. ° °

## 2021-06-06 ENCOUNTER — Encounter: Payer: Self-pay | Admitting: Obstetrics & Gynecology

## 2021-06-06 ENCOUNTER — Ambulatory Visit: Payer: Medicare Other | Admitting: Obstetrics & Gynecology

## 2021-06-06 VITALS — BP 183/83 | HR 70 | Ht 63.0 in | Wt 188.0 lb

## 2021-06-06 DIAGNOSIS — Z4689 Encounter for fitting and adjustment of other specified devices: Secondary | ICD-10-CM

## 2021-06-06 DIAGNOSIS — N3281 Overactive bladder: Secondary | ICD-10-CM | POA: Diagnosis not present

## 2021-06-06 DIAGNOSIS — N813 Complete uterovaginal prolapse: Secondary | ICD-10-CM | POA: Diagnosis not present

## 2021-06-06 NOTE — Progress Notes (Signed)
? ? ? ?  GYN VISIT ?Patient name: Barbara Lucero MRN [id number]  Date of birth: 1940/08/08 ?Chief Complaint:   ? ?Chief Complaint  ?Patient presents with  ? Pessary Check  ? ?History of Present Illness:   ?Barbara Lucero is a 81 y.o. G2P2002 PM female being seen today for pessary maintenance.    ? ?Blood pressure (!) 183/83, pulse 70, height 5\' 3"  (1.6 m), weight 188 lb (85.3 kg). ? ?Barbara Lucero presents today for routine follow up related to her pessary.   ?She uses a Ring #5 ?She reports no vaginal discharge and occasional light spotting. Denies acute concerns or problems since her prior visit ? ?Likert scale(1 not bothersome -5 very bothersome)  :  1 ? ?Review of Systems:   ?Pertinent items are noted in HPI ?Denies fever/chills, dizziness, headaches, visual disturbances, fatigue, shortness of breath, chest pain, abdominal pain, vomiting, bowel movements, OAB unchanged since previous, or intercourse unless otherwise stated above.  ?Pertinent History Reviewed:  ?Reviewed past medical,surgical, social, obstetrical and family history.  ?Reviewed problem list, medications and allergies. ?Physical Assessment:  ? ?Vitals:  ? 06/06/21 1344  ?BP: (!) 183/83  ?Pulse: 70  ?Weight: 188 lb (85.3 kg)  ?Height: 5\' 3"  (1.6 m)  ?Body mass index is 33.3 kg/m?. ? ?     Physical Examination:  ? General appearance: alert, well appearing, and in no distress ? Psych: mood appropriate, normal affect ? Skin: warm & dry  ? Cardiovascular: normal heart rate noted ? Respiratory: normal respiratory effort, no distress ? Abdomen: soft, non-tender  ? Exam reveals no undue vaginal mucosal pressure of breakdown, no discharge and no vaginal bleeding. ? ?Vaginal Epithelial Abnormality Classification System:   0 ?0    No abnormalities ?1    Epithelial erythema ?2    Granulation tissue ?3    Epithelial break or erosion, 1 cm or less ?4    Epithelial break or erosion, 1 cm or greater ? ?The pessary is removed, cleaned and replaced without difficulty.    ? Extremities: no edema  ? ?Chaperone: 06/08/21 [place]   ? ?Assessment & Plan:  ? ?  ICD-10-CM   ?1. Pessary maintenance, Milex ring with support #5, placed 03/2015  Z46.89   ?  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  ?3. OAB (overactive bladder): chronic + suboptimally managed  N32.81   ?  ?  ?-doing well with current pessary and plan to continue ? ? ?Return in about 4 months (around 10/06/2021) for pessary check (Ziyon Cedotal or eure). ? ? ?04/2015, DO ?Attending Obstetrician & Gynecologist, Faculty Practice ?[facility] for 10/08/2021, [facility] ? ? ?  ?

## 2021-06-18 ENCOUNTER — Ambulatory Visit (HOSPITAL_COMMUNITY)
Admission: RE | Admit: 2021-06-18 | Discharge: 2021-06-18 | Disposition: A | Discharge status: Home / Self Care | Payer: Medicare Other | Source: Ambulatory Visit | Attending: Cardiovascular Disease | Admitting: Cardiovascular Disease

## 2021-06-18 DIAGNOSIS — I350 Nonrheumatic aortic (valve) stenosis: Secondary | ICD-10-CM | POA: Insufficient documentation

## 2021-06-18 DIAGNOSIS — I351 Nonrheumatic aortic (valve) insufficiency: Secondary | ICD-10-CM

## 2021-06-18 LAB — ECHOCARDIOGRAM COMPLETE
AR max vel: 0.8 cm2
AV Area VTI: 0.79 cm2
AV Area mean vel: 0.7 cm2
AV Mean grad: 38.5 mmHg
AV Peak grad: 67.4 mmHg
Ao pk vel: 4.11 m/s
Area-P 1/2: 2.5 cm2
Calc EF: 62.3 %
MV VTI: 2.32 cm2
S' Lateral: 3.3 cm
Single Plane A2C EF: 62 %
Single Plane A4C EF: 63.3 %

## 2021-06-18 NOTE — Progress Notes (Signed)
*  PRELIMINARY RESULTS* ?Echocardiogram ?2D Echocardiogram has been performed. ? ?Barbara Lucero ?06/18/2021, 11:31 AM ?

## 2021-06-19 ENCOUNTER — Other Ambulatory Visit: Payer: Self-pay

## 2021-06-19 DIAGNOSIS — I350 Nonrheumatic aortic (valve) stenosis: Secondary | ICD-10-CM

## 2021-06-20 DIAGNOSIS — R112 Nausea with vomiting, unspecified: Secondary | ICD-10-CM | POA: Diagnosis not present

## 2021-06-20 DIAGNOSIS — K5080 Crohn's disease of both small and large intestine without complications: Secondary | ICD-10-CM | POA: Diagnosis not present

## 2021-06-20 DIAGNOSIS — R197 Diarrhea, unspecified: Secondary | ICD-10-CM | POA: Diagnosis not present

## 2021-06-26 DIAGNOSIS — I10 Essential (primary) hypertension: Secondary | ICD-10-CM | POA: Diagnosis not present

## 2021-07-01 ENCOUNTER — Other Ambulatory Visit: Payer: Self-pay | Admitting: Obstetrics & Gynecology

## 2021-07-15 DIAGNOSIS — I10 Essential (primary) hypertension: Secondary | ICD-10-CM | POA: Diagnosis not present

## 2021-07-15 DIAGNOSIS — Z733 Stress, not elsewhere classified: Secondary | ICD-10-CM | POA: Diagnosis not present

## 2021-07-24 DIAGNOSIS — R7303 Prediabetes: Secondary | ICD-10-CM | POA: Diagnosis not present

## 2021-07-24 DIAGNOSIS — E785 Hyperlipidemia, unspecified: Secondary | ICD-10-CM | POA: Diagnosis not present

## 2021-07-30 DIAGNOSIS — R7303 Prediabetes: Secondary | ICD-10-CM | POA: Diagnosis not present

## 2021-07-30 DIAGNOSIS — Z733 Stress, not elsewhere classified: Secondary | ICD-10-CM | POA: Diagnosis not present

## 2021-07-30 DIAGNOSIS — R011 Cardiac murmur, unspecified: Secondary | ICD-10-CM | POA: Diagnosis not present

## 2021-07-30 DIAGNOSIS — M25519 Pain in unspecified shoulder: Secondary | ICD-10-CM | POA: Diagnosis not present

## 2021-07-30 DIAGNOSIS — I10 Essential (primary) hypertension: Secondary | ICD-10-CM | POA: Diagnosis not present

## 2021-07-30 DIAGNOSIS — I700 Atherosclerosis of aorta: Secondary | ICD-10-CM | POA: Diagnosis not present

## 2021-07-30 DIAGNOSIS — I872 Venous insufficiency (chronic) (peripheral): Secondary | ICD-10-CM | POA: Diagnosis not present

## 2021-07-30 DIAGNOSIS — E785 Hyperlipidemia, unspecified: Secondary | ICD-10-CM | POA: Diagnosis not present

## 2021-07-30 DIAGNOSIS — Z0001 Encounter for general adult medical examination with abnormal findings: Secondary | ICD-10-CM | POA: Diagnosis not present

## 2021-08-26 ENCOUNTER — Other Ambulatory Visit: Payer: Self-pay | Admitting: Obstetrics & Gynecology

## 2021-10-07 ENCOUNTER — Ambulatory Visit: Payer: Medicare Other | Admitting: Obstetrics & Gynecology

## 2021-10-08 ENCOUNTER — Encounter: Payer: Self-pay | Admitting: Obstetrics & Gynecology

## 2021-10-08 ENCOUNTER — Ambulatory Visit: Payer: Medicare Other | Admitting: Obstetrics & Gynecology

## 2021-10-08 VITALS — BP 145/74 | HR 74 | Wt 182.0 lb

## 2021-10-08 DIAGNOSIS — N813 Complete uterovaginal prolapse: Secondary | ICD-10-CM | POA: Diagnosis not present

## 2021-10-08 DIAGNOSIS — Z4689 Encounter for fitting and adjustment of other specified devices: Secondary | ICD-10-CM

## 2021-10-08 NOTE — Progress Notes (Signed)
Chief Complaint  Patient presents with   Pessary Check    Blood pressure (!) 145/74, pulse 74, weight 182 lb (82.6 kg).  Barbara Lucero presents today for routine follow up related to her pessary.   She uses a Milex ring with support #5 She reports no vaginal discharge and no vaginal bleeding   Likert scale(1 not bothersome -5 very bothersome)  :  1  Exam reveals no undue vaginal mucosal pressure of breakdown, no discharge and no vaginal bleeding.  Vaginal Epithelial Abnormality Classification System:   0 0    No abnormalities 1    Epithelial erythema 2    Granulation tissue 3    Epithelial break or erosion, 1 cm or less 4    Epithelial break or erosion, 1 cm or greater  The pessary is removed, cleaned and replaced without difficulty.      ICD-10-CM   1. 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  Lazaro Arms, MD  10/08/2021 2:24 PM

## 2021-11-22 ENCOUNTER — Emergency Department (HOSPITAL_COMMUNITY): Payer: Medicare Other

## 2021-11-22 ENCOUNTER — Inpatient Hospital Stay (HOSPITAL_COMMUNITY)
Admission: EM | Admit: 2021-11-22 | Discharge: 2021-11-25 | DRG: 872 | Disposition: A | Discharge status: Home Health | Payer: Medicare Other | Attending: Internal Medicine | Admitting: Internal Medicine

## 2021-11-22 ENCOUNTER — Encounter (HOSPITAL_COMMUNITY): Payer: Self-pay | Admitting: Emergency Medicine

## 2021-11-22 ENCOUNTER — Other Ambulatory Visit: Payer: Self-pay

## 2021-11-22 DIAGNOSIS — A419 Sepsis, unspecified organism: Principal | ICD-10-CM | POA: Diagnosis present

## 2021-11-22 DIAGNOSIS — K219 Gastro-esophageal reflux disease without esophagitis: Secondary | ICD-10-CM

## 2021-11-22 DIAGNOSIS — I9589 Other hypotension: Secondary | ICD-10-CM | POA: Diagnosis not present

## 2021-11-22 DIAGNOSIS — E860 Dehydration: Secondary | ICD-10-CM | POA: Diagnosis present

## 2021-11-22 DIAGNOSIS — F419 Anxiety disorder, unspecified: Secondary | ICD-10-CM | POA: Diagnosis present

## 2021-11-22 DIAGNOSIS — I700 Atherosclerosis of aorta: Secondary | ICD-10-CM | POA: Diagnosis not present

## 2021-11-22 DIAGNOSIS — E861 Hypovolemia: Secondary | ICD-10-CM | POA: Diagnosis not present

## 2021-11-22 DIAGNOSIS — R0689 Other abnormalities of breathing: Secondary | ICD-10-CM | POA: Diagnosis not present

## 2021-11-22 DIAGNOSIS — R6520 Severe sepsis without septic shock: Secondary | ICD-10-CM | POA: Diagnosis present

## 2021-11-22 DIAGNOSIS — R197 Diarrhea, unspecified: Secondary | ICD-10-CM | POA: Diagnosis not present

## 2021-11-22 DIAGNOSIS — F32A Depression, unspecified: Secondary | ICD-10-CM | POA: Diagnosis not present

## 2021-11-22 DIAGNOSIS — R112 Nausea with vomiting, unspecified: Secondary | ICD-10-CM | POA: Diagnosis not present

## 2021-11-22 DIAGNOSIS — Z96642 Presence of left artificial hip joint: Secondary | ICD-10-CM | POA: Diagnosis not present

## 2021-11-22 DIAGNOSIS — R6889 Other general symptoms and signs: Secondary | ICD-10-CM | POA: Diagnosis not present

## 2021-11-22 DIAGNOSIS — Z20822 Contact with and (suspected) exposure to covid-19: Secondary | ICD-10-CM | POA: Diagnosis present

## 2021-11-22 DIAGNOSIS — R1032 Left lower quadrant pain: Secondary | ICD-10-CM

## 2021-11-22 DIAGNOSIS — R404 Transient alteration of awareness: Secondary | ICD-10-CM | POA: Diagnosis not present

## 2021-11-22 DIAGNOSIS — K5090 Crohn's disease, unspecified, without complications: Secondary | ICD-10-CM | POA: Diagnosis present

## 2021-11-22 DIAGNOSIS — Z91018 Allergy to other foods: Secondary | ICD-10-CM | POA: Diagnosis not present

## 2021-11-22 DIAGNOSIS — Z91040 Latex allergy status: Secondary | ICD-10-CM | POA: Diagnosis not present

## 2021-11-22 DIAGNOSIS — R0902 Hypoxemia: Secondary | ICD-10-CM | POA: Diagnosis not present

## 2021-11-22 DIAGNOSIS — R1110 Vomiting, unspecified: Secondary | ICD-10-CM | POA: Diagnosis not present

## 2021-11-22 DIAGNOSIS — K529 Noninfective gastroenteritis and colitis, unspecified: Secondary | ICD-10-CM

## 2021-11-22 DIAGNOSIS — Z743 Need for continuous supervision: Secondary | ICD-10-CM | POA: Diagnosis not present

## 2021-11-22 DIAGNOSIS — K5150 Left sided colitis without complications: Secondary | ICD-10-CM | POA: Diagnosis present

## 2021-11-22 DIAGNOSIS — Z79899 Other long term (current) drug therapy: Secondary | ICD-10-CM

## 2021-11-22 DIAGNOSIS — K8020 Calculus of gallbladder without cholecystitis without obstruction: Secondary | ICD-10-CM | POA: Diagnosis not present

## 2021-11-22 DIAGNOSIS — J9601 Acute respiratory failure with hypoxia: Secondary | ICD-10-CM

## 2021-11-22 DIAGNOSIS — K50919 Crohn's disease, unspecified, with unspecified complications: Secondary | ICD-10-CM | POA: Diagnosis not present

## 2021-11-22 DIAGNOSIS — R911 Solitary pulmonary nodule: Secondary | ICD-10-CM | POA: Diagnosis not present

## 2021-11-22 DIAGNOSIS — N179 Acute kidney failure, unspecified: Secondary | ICD-10-CM | POA: Diagnosis not present

## 2021-11-22 DIAGNOSIS — I959 Hypotension, unspecified: Secondary | ICD-10-CM | POA: Diagnosis not present

## 2021-11-22 DIAGNOSIS — E785 Hyperlipidemia, unspecified: Secondary | ICD-10-CM | POA: Diagnosis present

## 2021-11-22 DIAGNOSIS — R109 Unspecified abdominal pain: Secondary | ICD-10-CM | POA: Diagnosis not present

## 2021-11-22 DIAGNOSIS — E041 Nontoxic single thyroid nodule: Secondary | ICD-10-CM | POA: Diagnosis not present

## 2021-11-22 LAB — RESP PANEL BY RT-PCR (FLU A&B, COVID) ARPGX2
Influenza A by PCR: NEGATIVE
Influenza B by PCR: NEGATIVE
SARS Coronavirus 2 by RT PCR: NEGATIVE

## 2021-11-22 LAB — COMPREHENSIVE METABOLIC PANEL
ALT: 10 U/L (ref 0–44)
AST: 23 U/L (ref 15–41)
Albumin: 3.6 g/dL (ref 3.5–5.0)
Alkaline Phosphatase: 69 U/L (ref 38–126)
Anion gap: 9 (ref 5–15)
BUN: 18 mg/dL (ref 8–23)
CO2: 27 mmol/L (ref 22–32)
Calcium: 8.1 mg/dL — ABNORMAL LOW (ref 8.9–10.3)
Chloride: 100 mmol/L (ref 98–111)
Creatinine, Ser: 1.32 mg/dL — ABNORMAL HIGH (ref 0.44–1.00)
GFR, Estimated: 41 mL/min — ABNORMAL LOW (ref >60)
Glucose, Bld: 123 mg/dL — ABNORMAL HIGH (ref 70–99)
Potassium: 4.8 mmol/L (ref 3.5–5.1)
Sodium: 136 mmol/L (ref 135–145)
Total Bilirubin: 1.2 mg/dL (ref 0.3–1.2)
Total Protein: 6.2 g/dL — ABNORMAL LOW (ref 6.5–8.1)

## 2021-11-22 LAB — I-STAT CHEM 8, ED
BUN: 19 mg/dL (ref 8–23)
Calcium, Ion: 1.11 mmol/L — ABNORMAL LOW (ref 1.15–1.40)
Chloride: 98 mmol/L (ref 98–111)
Creatinine, Ser: 1.3 mg/dL — ABNORMAL HIGH (ref 0.44–1.00)
Glucose, Bld: 120 mg/dL — ABNORMAL HIGH (ref 70–99)
HCT: 37 % (ref 36.0–46.0)
Hemoglobin: 12.6 g/dL (ref 12.0–15.0)
Potassium: 4.4 mmol/L (ref 3.5–5.1)
Sodium: 135 mmol/L (ref 135–145)
TCO2: 30 mmol/L (ref 22–32)

## 2021-11-22 LAB — LIPASE, BLOOD: Lipase: 32 U/L (ref 11–51)

## 2021-11-22 LAB — TROPONIN I (HIGH SENSITIVITY)
Troponin I (High Sensitivity): 3 ng/L (ref <18)
Troponin I (High Sensitivity): 4 ng/L (ref <18)

## 2021-11-22 LAB — CBC
HCT: 36.7 % (ref 36.0–46.0)
Hemoglobin: 11.9 g/dL — ABNORMAL LOW (ref 12.0–15.0)
MCH: 29.8 pg (ref 26.0–34.0)
MCHC: 32.4 g/dL (ref 30.0–36.0)
MCV: 92 fL (ref 80.0–100.0)
Platelets: 186 10*3/uL (ref 150–400)
RBC: 3.99 MIL/uL (ref 3.87–5.11)
RDW: 13.2 % (ref 11.5–15.5)
WBC: 15.3 10*3/uL — ABNORMAL HIGH (ref 4.0–10.5)
nRBC: 0 % (ref 0.0–0.2)

## 2021-11-22 LAB — CBG MONITORING, ED: Glucose-Capillary: 112 mg/dL — ABNORMAL HIGH (ref 70–99)

## 2021-11-22 LAB — LACTIC ACID, PLASMA: Lactic Acid, Venous: 1.4 mmol/L (ref 0.5–1.9)

## 2021-11-22 MED ORDER — NOREPINEPHRINE 4 MG/250ML-% IV SOLN: 2.0000 ug/min | INTRAVENOUS | Status: DC

## 2021-11-22 MED ORDER — IOHEXOL 350 MG/ML SOLN
100.0000 mL | Freq: Once | INTRAVENOUS | Status: AC | PRN
  Administered 2021-11-22: 100 mL via INTRAVENOUS

## 2021-11-22 MED ORDER — SODIUM CHLORIDE 0.9 % IV SOLN
250.0000 mL | INTRAVENOUS | Status: DC
  Administered 2021-11-22 – 2021-11-23 (×2): 250 mL via INTRAVENOUS

## 2021-11-22 MED ORDER — ONDANSETRON HCL 4 MG/2ML IJ SOLN
4.0000 mg | Freq: Once | INTRAMUSCULAR | Status: DC
  Filled 2021-11-22: qty 2

## 2021-11-22 MED ORDER — SODIUM CHLORIDE 0.9 % IV SOLN
2.0000 g | Freq: Once | INTRAVENOUS | Status: AC
  Administered 2021-11-22: 2 g via INTRAVENOUS
  Filled 2021-11-22: qty 12.5

## 2021-11-22 MED ORDER — METRONIDAZOLE 500 MG/100ML IV SOLN
500.0000 mg | Freq: Once | INTRAVENOUS | Status: AC
  Administered 2021-11-22: 500 mg via INTRAVENOUS
  Filled 2021-11-22: qty 100

## 2021-11-22 MED ORDER — VANCOMYCIN HCL 750 MG/150ML IV SOLN
750.0000 mg | Freq: Once | INTRAVENOUS | Status: DC
  Filled 2021-11-22: qty 150

## 2021-11-22 MED ORDER — SODIUM CHLORIDE 0.9 % IV SOLN
2.0000 g | Freq: Two times a day (BID) | INTRAVENOUS | Status: DC
  Administered 2021-11-23 – 2021-11-24 (×3): 2 g via INTRAVENOUS
  Filled 2021-11-22 (×3): qty 12.5

## 2021-11-22 MED ORDER — VANCOMYCIN HCL 1250 MG/250ML IV SOLN: 1250.0000 mg | INTRAVENOUS | Status: DC

## 2021-11-22 MED ORDER — VANCOMYCIN HCL IN DEXTROSE 1-5 GM/200ML-% IV SOLN
1000.0000 mg | Freq: Once | INTRAVENOUS | Status: AC
  Administered 2021-11-22: 1000 mg via INTRAVENOUS
  Filled 2021-11-22: qty 200

## 2021-11-22 NOTE — ED Notes (Signed)
Pt placed on 02 2l [place].  

## 2021-11-22 NOTE — ED Notes (Signed)
Pt states her dizziness is better .

## 2021-11-22 NOTE — ED Triage Notes (Addendum)
Pt c/o n/v/d all day, then went out to eat and became dizzy and left leg felt week then right leg felt week. Pt arrived groggy. Bs in route was 130.  Pt oriented. Pt lying flat and RN attempting 2nd iv. IV by ems now has L ns running wo. EDP aware.

## 2021-11-22 NOTE — Progress Notes (Signed)
Pharmacy Antibiotic Note  Barbara Lucero is a 81 y.o. female admitted on 11/22/2021 with sepsis.  Pharmacy has been consulted for vancomycin and cefepime dosing.  Plan: Vancomycin 1250 IV every 48 hours.  Goal trough 15-20 mcg/mL. Goal vancomycin AUC is 400-600 mcg*h/ml. Based on CrCl, cefepime 2g IV q12h since this is a sever infection  Height: 5\' 3"  (160 cm) Weight: 82.6 kg (182 lb) IBW/kg (Calculated) : 52.4  Temp (24hrs), Avg:97.8 F (36.6 C), Min:97.8 F (36.6 C), Max:97.8 F (36.6 C)  Recent Labs  Lab 11/22/21 2032 11/22/21 2041  WBC 15.3*  --   CREATININE 1.32* 1.30*  LATICACIDVEN 1.4  --     Estimated Creatinine Clearance: 35.7 mL/min (A) (by C-G formula based on SCr of 1.3 mg/dL (H)).    Allergies  Allergen Reactions   Latex Hives    Antimicrobials this admission: Cefepime 2g x1 9/29 Vancomycin 1750mg  x1 9/29  Dose adjustments this admission: N/a  Microbiology results: 9/29 Bcx pending 9/29 RVP pending  Thank you for allowing pharmacy to be a part of this patient's care.  Jordan Hawks Sulamita Lafountain 11/22/2021 9:59 PM

## 2021-11-22 NOTE — ED Provider Notes (Signed)
[facility] EMERGENCY DEPARTMENT Provider Note   CSN: [id number] Arrival date & time: 11/22/21  2014     History {Add pertinent medical, surgical, social history, OB history to HPI:1} Chief Complaint  Patient presents with   Emesis   Diarrhea    Barbara Lucero is a 81 y.o. female.  N/VD x4 hours. Dry heaving. Dizzy but didn't pass out. No CP or SOB. Not on home o2. Dysuria. No abd significant abdominal pain.       Home Medications Prior to Admission medications   Medication Sig Start Date End Date Taking? Authorizing Provider  acetaminophen (TYLENOL) 650 MG CR tablet Take 650 mg by mouth every 8 (eight) hours as needed for pain.    [provider]  Ascorbic Acid (VITAMIN C) 1000 MG tablet Take 1,000 mg by mouth.     [provider]  b complex vitamins tablet Take 1 tablet by mouth daily.    [provider]  CALCIUM PO Take 1 tablet by mouth daily.    [provider]  COLLAGEN PO Take by mouth as directed.    [provider]  furosemide (LASIX) 20 MG tablet Take 20 mg by mouth daily. 04/30/20   [provider]  gabapentin (NEURONTIN) 100 MG capsule Take 100 mg by mouth 3 (three) times daily.    [provider]  Multiple Vitamin (MULTIVITAMIN) tablet Take 1 tablet by mouth daily.    [provider]  MYRBETRIQ 50 MG TB24 tablet TAKE (1) TABLET BY MOUTH AT BEDTIME. 07/25/21   Florian Buff, MD  pantoprazole (PROTONIX) 40 MG tablet Take 40 mg by mouth daily. 07/27/19   [provider]  potassium chloride 20 MEQ/15ML (10%) SOLN SMARTSIG:Milliliter(s) By Mouth 04/02/20   [provider]  Psyllium (METAMUCIL PO) Take by mouth as directed.     [provider]  sertraline (ZOLOFT) 25 MG tablet Take 25 mg by mouth as directed. 07/26/19   [provider]  simvastatin (ZOCOR) 10 MG tablet Take 10 mg by mouth at bedtime. 08/30/19   [provider]  solifenacin (VESICARE) 10 MG tablet  TAKE (1) TABLET BY MOUTH AT BEDTIME. 10/09/21   Florian Buff, MD  sulfaSALAzine (AZULFIDINE) 500 MG tablet Take 500 mg by mouth 2 (two) times daily.     [provider]  vitamin E 400 UNIT capsule Take 400 Units by mouth daily.    [provider]      Allergies    Latex    Review of Systems   Review of Systems  Physical Exam Updated Vital Signs BP (!) 82/42   Pulse 70   Resp 17   SpO2 94%  Physical Exam  ED Results / Procedures / Treatments   Labs (all labs ordered are listed, but only abnormal results are displayed) Labs Reviewed  LIPASE, BLOOD  COMPREHENSIVE METABOLIC PANEL  CBC  URINALYSIS, ROUTINE W REFLEX MICROSCOPIC  CBG MONITORING, ED    EKG None  Radiology No results found.  Procedures Procedures  {Document cardiac monitor, telemetry assessment procedure when appropriate:1}  Medications Ordered in ED Medications - No data to display  ED Course/ Medical Decision Making/ A&P                           Medical Decision Making Amount and/or Complexity of Data Reviewed Labs: ordered.   ***  {Document critical care time when appropriate:1} {Document review of labs and clinical  decision tools ie heart score, Chads2Vasc2 etc:1}  {Document your independent review of radiology images, and any outside records:1} {Document your discussion with family members, caretakers, and with consultants:1} {Document social determinants of health affecting pt's care:1} {Document your decision making why or why not admission, treatments were needed:1} Final Clinical Impression(s) / ED Diagnoses Final diagnoses:  None    Rx / DC Orders ED Discharge Orders     None

## 2021-11-23 ENCOUNTER — Encounter (HOSPITAL_COMMUNITY): Payer: Self-pay | Admitting: Family Medicine

## 2021-11-23 DIAGNOSIS — Z20822 Contact with and (suspected) exposure to covid-19: Secondary | ICD-10-CM | POA: Diagnosis present

## 2021-11-23 DIAGNOSIS — R197 Diarrhea, unspecified: Secondary | ICD-10-CM | POA: Diagnosis not present

## 2021-11-23 DIAGNOSIS — F419 Anxiety disorder, unspecified: Secondary | ICD-10-CM

## 2021-11-23 DIAGNOSIS — K529 Noninfective gastroenteritis and colitis, unspecified: Secondary | ICD-10-CM | POA: Diagnosis not present

## 2021-11-23 DIAGNOSIS — R1032 Left lower quadrant pain: Secondary | ICD-10-CM | POA: Diagnosis not present

## 2021-11-23 DIAGNOSIS — Z79899 Other long term (current) drug therapy: Secondary | ICD-10-CM | POA: Diagnosis not present

## 2021-11-23 DIAGNOSIS — F32A Depression, unspecified: Secondary | ICD-10-CM | POA: Diagnosis present

## 2021-11-23 DIAGNOSIS — A419 Sepsis, unspecified organism: Secondary | ICD-10-CM | POA: Diagnosis present

## 2021-11-23 DIAGNOSIS — R6520 Severe sepsis without septic shock: Secondary | ICD-10-CM | POA: Diagnosis present

## 2021-11-23 DIAGNOSIS — K50919 Crohn's disease, unspecified, with unspecified complications: Secondary | ICD-10-CM

## 2021-11-23 DIAGNOSIS — K219 Gastro-esophageal reflux disease without esophagitis: Secondary | ICD-10-CM | POA: Diagnosis present

## 2021-11-23 DIAGNOSIS — N179 Acute kidney failure, unspecified: Secondary | ICD-10-CM | POA: Diagnosis present

## 2021-11-23 DIAGNOSIS — Z91040 Latex allergy status: Secondary | ICD-10-CM | POA: Diagnosis not present

## 2021-11-23 DIAGNOSIS — I9589 Other hypotension: Secondary | ICD-10-CM | POA: Diagnosis present

## 2021-11-23 DIAGNOSIS — K5150 Left sided colitis without complications: Secondary | ICD-10-CM | POA: Diagnosis present

## 2021-11-23 DIAGNOSIS — E785 Hyperlipidemia, unspecified: Secondary | ICD-10-CM | POA: Diagnosis present

## 2021-11-23 DIAGNOSIS — Z91018 Allergy to other foods: Secondary | ICD-10-CM | POA: Diagnosis not present

## 2021-11-23 DIAGNOSIS — J9601 Acute respiratory failure with hypoxia: Secondary | ICD-10-CM | POA: Diagnosis present

## 2021-11-23 DIAGNOSIS — E861 Hypovolemia: Secondary | ICD-10-CM | POA: Diagnosis present

## 2021-11-23 DIAGNOSIS — Z96642 Presence of left artificial hip joint: Secondary | ICD-10-CM | POA: Diagnosis present

## 2021-11-23 DIAGNOSIS — E860 Dehydration: Secondary | ICD-10-CM | POA: Diagnosis present

## 2021-11-23 LAB — CORTISOL-AM, BLOOD: Cortisol - AM: 11.4 ug/dL (ref 6.7–22.6)

## 2021-11-23 LAB — TSH: TSH: 1.557 u[IU]/mL (ref 0.350–4.500)

## 2021-11-23 LAB — COMPREHENSIVE METABOLIC PANEL
ALT: 11 U/L (ref 0–44)
AST: 15 U/L (ref 15–41)
Albumin: 3.5 g/dL (ref 3.5–5.0)
Alkaline Phosphatase: 65 U/L (ref 38–126)
Anion gap: 5 (ref 5–15)
BUN: 15 mg/dL (ref 8–23)
CO2: 28 mmol/L (ref 22–32)
Calcium: 8.2 mg/dL — ABNORMAL LOW (ref 8.9–10.3)
Chloride: 105 mmol/L (ref 98–111)
Creatinine, Ser: 0.87 mg/dL (ref 0.44–1.00)
GFR, Estimated: >60 mL/min (ref >60)
Glucose, Bld: 80 mg/dL (ref 70–99)
Potassium: 4.2 mmol/L (ref 3.5–5.1)
Sodium: 138 mmol/L (ref 135–145)
Total Bilirubin: 0.9 mg/dL (ref 0.3–1.2)
Total Protein: 6.3 g/dL — ABNORMAL LOW (ref 6.5–8.1)

## 2021-11-23 LAB — MAGNESIUM: Magnesium: 2.2 mg/dL (ref 1.7–2.4)

## 2021-11-23 LAB — CBC WITH DIFFERENTIAL/PLATELET
Abs Immature Granulocytes: 0.07 10*3/uL (ref 0.00–0.07)
Basophils Absolute: 0 10*3/uL (ref 0.0–0.1)
Basophils Relative: 0 %
Eosinophils Absolute: 0.2 10*3/uL (ref 0.0–0.5)
Eosinophils Relative: 1 %
HCT: 36.3 % (ref 36.0–46.0)
Hemoglobin: 11.7 g/dL — ABNORMAL LOW (ref 12.0–15.0)
Immature Granulocytes: 1 %
Lymphocytes Relative: 9 %
Lymphs Abs: 1.2 10*3/uL (ref 0.7–4.0)
MCH: 29.9 pg (ref 26.0–34.0)
MCHC: 32.2 g/dL (ref 30.0–36.0)
MCV: 92.8 fL (ref 80.0–100.0)
Monocytes Absolute: 0.9 10*3/uL (ref 0.1–1.0)
Monocytes Relative: 7 %
Neutro Abs: 11.5 10*3/uL — ABNORMAL HIGH (ref 1.7–7.7)
Neutrophils Relative %: 82 %
Platelets: 187 10*3/uL (ref 150–400)
RBC: 3.91 MIL/uL (ref 3.87–5.11)
RDW: 13.3 % (ref 11.5–15.5)
WBC: 13.8 10*3/uL — ABNORMAL HIGH (ref 4.0–10.5)
nRBC: 0 % (ref 0.0–0.2)

## 2021-11-23 LAB — PROTIME-INR
INR: 1.2 (ref 0.8–1.2)
Prothrombin Time: 14.8 seconds (ref 11.4–15.2)

## 2021-11-23 LAB — PROCALCITONIN: Procalcitonin: 0.16 ng/mL

## 2021-11-23 LAB — MRSA NEXT GEN BY PCR, NASAL: MRSA by PCR Next Gen: NOT DETECTED

## 2021-11-23 MED ORDER — SODIUM CHLORIDE 0.9 % IV SOLN: INTRAVENOUS | Status: DC

## 2021-11-23 MED ORDER — SERTRALINE HCL 50 MG PO TABS: 25.0000 mg | ORAL_TABLET | ORAL | Status: DC

## 2021-11-23 MED ORDER — MIDODRINE HCL 5 MG PO TABS
5.0000 mg | ORAL_TABLET | Freq: Once | ORAL | Status: AC
  Administered 2021-11-23: 5 mg via ORAL
  Filled 2021-11-23: qty 1

## 2021-11-23 MED ORDER — MIDODRINE HCL 5 MG PO TABS
10.0000 mg | ORAL_TABLET | Freq: Three times a day (TID) | ORAL | Status: DC
  Administered 2021-11-23 (×2): 10 mg via ORAL
  Filled 2021-11-23 (×2): qty 2

## 2021-11-23 MED ORDER — SERTRALINE HCL 50 MG PO TABS
25.0000 mg | ORAL_TABLET | Freq: Every day | ORAL | Status: DC
  Administered 2021-11-23 – 2021-11-25 (×3): 25 mg via ORAL
  Filled 2021-11-23 (×3): qty 1

## 2021-11-23 MED ORDER — OXYCODONE HCL 5 MG PO TABS
5.0000 mg | ORAL_TABLET | ORAL | Status: DC | PRN
  Administered 2021-11-23: 5 mg via ORAL
  Filled 2021-11-23: qty 1

## 2021-11-23 MED ORDER — SULFASALAZINE 500 MG PO TABS
500.0000 mg | ORAL_TABLET | Freq: Two times a day (BID) | ORAL | Status: DC
  Administered 2021-11-23 – 2021-11-25 (×5): 500 mg via ORAL
  Filled 2021-11-23 (×9): qty 1

## 2021-11-23 MED ORDER — MIRABEGRON ER 25 MG PO TB24
50.0000 mg | ORAL_TABLET | Freq: Every day | ORAL | Status: DC
  Administered 2021-11-23 – 2021-11-24 (×2): 50 mg via ORAL
  Filled 2021-11-23 (×2): qty 2

## 2021-11-23 MED ORDER — SODIUM CHLORIDE 0.9 % IV BOLUS
500.0000 mL | Freq: Once | INTRAVENOUS | Status: AC
  Administered 2021-11-23: 500 mL via INTRAVENOUS

## 2021-11-23 MED ORDER — ACETAMINOPHEN 650 MG RE SUPP: 650.0000 mg | Freq: Four times a day (QID) | RECTAL | Status: DC | PRN

## 2021-11-23 MED ORDER — PANTOPRAZOLE SODIUM 40 MG PO TBEC
40.0000 mg | DELAYED_RELEASE_TABLET | Freq: Every day | ORAL | Status: DC
  Administered 2021-11-23 – 2021-11-25 (×3): 40 mg via ORAL
  Filled 2021-11-23 (×3): qty 1

## 2021-11-23 MED ORDER — CHLORHEXIDINE GLUCONATE CLOTH 2 % EX PADS
6.0000 | MEDICATED_PAD | Freq: Every day | CUTANEOUS | Status: DC
  Administered 2021-11-23 – 2021-11-25 (×3): 6 via TOPICAL

## 2021-11-23 MED ORDER — FESOTERODINE FUMARATE ER 4 MG PO TB24
4.0000 mg | ORAL_TABLET | Freq: Every day | ORAL | Status: DC
  Administered 2021-11-23 – 2021-11-25 (×3): 4 mg via ORAL
  Filled 2021-11-23 (×5): qty 1

## 2021-11-23 MED ORDER — OXYCODONE HCL 5 MG PO TABS: 5.0000 mg | ORAL_TABLET | Freq: Four times a day (QID) | ORAL | Status: DC | PRN

## 2021-11-23 MED ORDER — METRONIDAZOLE 500 MG/100ML IV SOLN
500.0000 mg | Freq: Two times a day (BID) | INTRAVENOUS | Status: DC
  Administered 2021-11-23 – 2021-11-24 (×3): 500 mg via INTRAVENOUS
  Filled 2021-11-23 (×3): qty 100

## 2021-11-23 MED ORDER — ONDANSETRON HCL 4 MG/2ML IJ SOLN: 4.0000 mg | Freq: Four times a day (QID) | INTRAMUSCULAR | Status: DC | PRN

## 2021-11-23 MED ORDER — ONDANSETRON HCL 4 MG PO TABS: 4.0000 mg | ORAL_TABLET | Freq: Four times a day (QID) | ORAL | Status: DC | PRN

## 2021-11-23 MED ORDER — GABAPENTIN 100 MG PO CAPS
100.0000 mg | ORAL_CAPSULE | Freq: Three times a day (TID) | ORAL | Status: DC
  Administered 2021-11-23 – 2021-11-25 (×7): 100 mg via ORAL
  Filled 2021-11-23 (×7): qty 1

## 2021-11-23 MED ORDER — METHYLPREDNISOLONE SODIUM SUCC 125 MG IJ SOLR
125.0000 mg | INTRAMUSCULAR | Status: DC
  Administered 2021-11-23 – 2021-11-25 (×3): 125 mg via INTRAVENOUS
  Filled 2021-11-23 (×3): qty 2

## 2021-11-23 MED ORDER — ACETAMINOPHEN 325 MG PO TABS
650.0000 mg | ORAL_TABLET | Freq: Four times a day (QID) | ORAL | Status: DC | PRN
  Administered 2021-11-25: 650 mg via ORAL
  Filled 2021-11-23: qty 2

## 2021-11-23 MED ORDER — HEPARIN SODIUM (PORCINE) 5000 UNIT/ML IJ SOLN
5000.0000 [IU] | Freq: Three times a day (TID) | INTRAMUSCULAR | Status: DC
  Administered 2021-11-23 – 2021-11-25 (×7): 5000 [IU] via SUBCUTANEOUS
  Filled 2021-11-23 (×7): qty 1

## 2021-11-23 MED ORDER — SIMVASTATIN 20 MG PO TABS
10.0000 mg | ORAL_TABLET | Freq: Every day | ORAL | Status: DC
  Administered 2021-11-23 – 2021-11-24 (×2): 10 mg via ORAL
  Filled 2021-11-23 (×2): qty 1

## 2021-11-23 NOTE — Assessment & Plan Note (Addendum)
-   History of Crohn's - Patient had not been taking her medications adequately or actively following with GI as an outpatient. -Education provided; GI service will plan for outpatient colonoscopy and follow-up. -Continue sulfasalazine and steroids tapering.

## 2021-11-23 NOTE — Assessment & Plan Note (Addendum)
Continue Protonix °

## 2021-11-23 NOTE — Assessment & Plan Note (Addendum)
-  Continue Zoloft -Mood stable overall.

## 2021-11-23 NOTE — Progress Notes (Signed)
Patient seen and examined; admitted after midnight secondary to abdominal pain, nausea, diarrhea.  She met sepsis criteria with tachycardia, tachypnea, elevated WBCs and presumed source of infection colitis and pneumonia.  Patient with underlying history of Crohn's disease and has not been faithful taking her sulfasalazine management.  Currently hemodynamically stable and no having any fevers.  Please refer to H&P written by Dr. Clearence Ped for further info/details on admission.  Plan: -Continue to maintain adequate hydration -Continue current antibiotics and the use of a sitter -Follow GI service recommendation -Continue the use of his steroids and provide supportive care with antiemetics and analgesics as needed. -Resume the use of sulfasalazine. -Follow clinical response. -advance diet.  Barton Dubois MD [phone]

## 2021-11-23 NOTE — Consult Note (Signed)
Consulting  Provider: Dr. Clearence Ped Primary Care Physician:  Celene Squibb, MD Primary Gastroenterologist:  Dr. Laural Goldenmany years ago")  Reason for Consultation: Colitis, Crohn's disease  HPI:  Barbara Lucero is a 81 y.o. female with a past medical history of anxiety, arthritis, Crohn's disease, dyslipidemia, urinary incontinence, who presented to [facility] ER yesterday with generalized weakness/fatigue.  Patient states she was in her normal state of health until yesterday morning when she had numerous episodes of diarrhea.  States she was on the toilet for nearly 4 to 5 hours.  Also with nausea and vomiting, no hematemesis or coffee-ground emesis.  Went to run a few errands with her daughter and became very dizzy, 911 was then activated.  In the ER, found to have hypotension with blood pressure 60/36, responded well to IV fluids.  Has not required any pressors.  Initial blood work showed WBC of 15.3, hemoglobin 11.9.  Patient denies any rectal bleeding.  No mucus in her stool.  Does note some left lower quadrant abdominal pain which is also new.  Moderate in severity.  This is improved today.  CT abdomen pelvis with contrast which I personally reviewed showed severe colitis of the descending and sigmoid colon.  Patient does know taking ibuprofen twice daily for 1 to 2 weeks for arthritic pains.  Previously on Tylenol arthritis.  No recent antibiotics.  No sick contacts at home.  In regards to her Crohn's disease, she states she was diagnosed in her 15s.  States she takes sulfasalazine on an as-needed basis.  Has not seen a GI doctor in many years.  States she saw Dr. Laural Golden "many years ago."  Has been managing her Crohn's disease on her own.  No history of bowel resection.  Last colonoscopy greater than 10 years ago.   Past Medical History:  Diagnosis Date   Anxiety    Arthritis    Crohn disease ([place])    Depression    Varicose veins of bilateral lower extremities with other  complications     Past Surgical History:  Procedure Laterality Date   BOWEL RESECTION     HIP ARTHROPLASTY Left 06/06/2016   Procedure: ARTHROPLASTY BIPOLAR HIP (HEMIARTHROPLASTY);  Surgeon: Carole Civil, MD;  Location: [facility];  Service: Orthopedics;  Laterality: Left;   lt hip replacement      Prior to Admission medications   Medication Sig Start Date End Date Taking? Authorizing Provider  acetaminophen (TYLENOL) 650 MG CR tablet Take 650 mg by mouth every 8 (eight) hours as needed for pain.   Yes [provider]  Ascorbic Acid (VITAMIN C) 1000 MG tablet Take 1,000 mg by mouth.    Yes [provider]  b complex vitamins tablet Take 1 tablet by mouth daily.   Yes [provider]  CALCIUM PO Take 1 tablet by mouth daily.   Yes [provider]  COLLAGEN PO Take 1 tablet by mouth daily.   Yes [provider]  furosemide (LASIX) 20 MG tablet Take 20 mg by mouth daily. 04/30/20  Yes [provider]  gabapentin (NEURONTIN) 300 MG capsule Take 300 mg by mouth 2 (two) times daily. 10/16/21  Yes [provider]  ibuprofen (ADVIL) 200 MG tablet Take 200 mg by mouth every 6 (six) hours as needed for headache or mild pain.   Yes [provider]  MYRBETRIQ 50 MG TB24 tablet TAKE (1) TABLET BY MOUTH AT BEDTIME. Patient taking differently: Take 50 mg by mouth at  bedtime. 07/25/21  Yes Florian Buff, MD  olmesartan (BENICAR) 40 MG tablet Take 40 mg by mouth daily. 10/30/21  Yes [provider]  pantoprazole (PROTONIX) 40 MG tablet Take 40 mg by mouth daily. 07/27/19  Yes [provider]  Psyllium (METAMUCIL PO) Take 1 Package by mouth daily as needed (constipation).   Yes [provider]  sertraline (ZOLOFT) 100 MG tablet Take 100 mg by mouth daily. 11/13/21  Yes [provider]  simvastatin (ZOCOR) 10 MG tablet Take 10 mg by mouth at bedtime. 08/30/19  Yes [provider]  solifenacin  (VESICARE) 10 MG tablet TAKE (1) TABLET BY MOUTH AT BEDTIME. Patient taking differently: Take 10 mg by mouth at bedtime. 10/09/21  Yes Florian Buff, MD  sulfaSALAzine (AZULFIDINE) 500 MG tablet Take 500 mg by mouth 2 (two) times daily.    Yes [provider]  vitamin E 400 UNIT capsule Take 400 Units by mouth daily.   Yes [provider]    Current Facility-Administered Medications  Medication Dose Route Frequency Provider Last Rate Last Admin   0.9 %  sodium chloride infusion  250 mL Intravenous Continuous Zierle-Ghosh, Asia B, DO 10 mL/hr at 11/23/21 1029 Infusion Verify at 11/23/21 1029   0.9 %  sodium chloride infusion   Intravenous Continuous Barton Dubois, MD 75 mL/hr at 11/23/21 1029 Infusion Verify at 11/23/21 1029   acetaminophen (TYLENOL) tablet 650 mg  650 mg Oral Q6H PRN Zierle-Ghosh, Asia B, DO       Or   acetaminophen (TYLENOL) suppository 650 mg  650 mg Rectal Q6H PRN Zierle-Ghosh, Asia B, DO       ceFEPIme (MAXIPIME) 2 g in sodium chloride 0.9 % 100 mL IVPB  2 g Intravenous Q12H Zierle-Ghosh, Asia B, DO   Stopped at 11/23/21 1012   Chlorhexidine Gluconate Cloth 2 % PADS 6 each  6 each Topical Daily Barton Dubois, MD   6 each at 11/23/21 0944   fesoterodine (TOVIAZ) tablet 4 mg  4 mg Oral Daily Zierle-Ghosh, Asia B, DO   4 mg at 11/23/21 0939   gabapentin (NEURONTIN) capsule 100 mg  100 mg Oral TID Zierle-Ghosh, Asia B, DO   100 mg at 11/23/21 0939   heparin injection 5,000 Units  5,000 Units Subcutaneous Q8H Zierle-Ghosh, Asia B, DO   5,000 Units at 11/23/21 [id number]   methylPREDNISolone sodium succinate (SOLU-MEDROL) 125 mg/2 mL injection 125 mg  125 mg Intravenous Q24H Zierle-Ghosh, Asia B, DO   125 mg at 11/23/21 [id number]   metroNIDAZOLE (FLAGYL) IVPB 500 mg  500 mg Intravenous Q12H Zierle-Ghosh, Asia B, DO       midodrine (PROAMATINE) tablet 5 mg  5 mg Oral Once Barton Dubois, MD       mirabegron ER (MYRBETRIQ) tablet 50 mg  50 mg Oral QHS Zierle-Ghosh, Asia B,  DO       ondansetron (ZOFRAN) injection 4 mg  4 mg Intravenous Once Zierle-Ghosh, Asia B, DO       ondansetron (ZOFRAN) tablet 4 mg  4 mg Oral Q6H PRN Zierle-Ghosh, Asia B, DO       Or   ondansetron (ZOFRAN) injection 4 mg  4 mg Intravenous Q6H PRN Zierle-Ghosh, Asia B, DO       oxyCODONE (Oxy IR/ROXICODONE) immediate release tablet 5 mg  5 mg Oral Q6H PRN Barton Dubois, MD       pantoprazole (PROTONIX) EC tablet 40 mg  40 mg Oral Daily Zierle-Ghosh, Asia B, DO  40 mg at 11/23/21 0939   sertraline (ZOLOFT) tablet 25 mg  25 mg Oral Daily Barton Dubois, MD   25 mg at 11/23/21 [id number]   simvastatin (ZOCOR) tablet 10 mg  10 mg Oral QHS Zierle-Ghosh, Asia B, DO       sulfaSALAzine (AZULFIDINE) tablet 500 mg  500 mg Oral BID Zierle-Ghosh, Asia B, DO   500 mg at 11/23/21 [id number]    Allergies as of 11/22/2021 - Review Complete 11/22/2021  Allergen Reaction Noted   Latex Hives 06/06/2021    Family History  Problem Relation Age of Onset   Breast cancer Sister    Breast cancer Sister    Other Son        disabled   Bipolar disorder Daughter     Social History   Socioeconomic History   Marital status: Widowed    Spouse name: Not on file   Number of children: Not on file   Years of education: Not on file   Highest education level: Not on file  Occupational History   Not on file  Tobacco Use   Smoking status: Never   Smokeless tobacco: Never  Vaping Use   Vaping Use: Never used  Substance and Sexual Activity   Alcohol use: No   Drug use: No   Sexual activity: Not Currently    Birth control/protection: Post-menopausal  Other Topics Concern   Not on file  Social History Narrative   Not on file   Social Determinants of Health   Financial Resource Strain: Not on file  Food Insecurity: No Food Insecurity (11/23/2021)   Hunger Vital Sign    Worried About Running Out of Food in the Last Year: Never true    Ran Out of Food in the Last Year: Never true  Transportation Needs: No  Transportation Needs (11/23/2021)   PRAPARE - Hydrologist (Medical): No    Lack of Transportation (Non-Medical): No  Physical Activity: Not on file  Stress: Not on file  Social Connections: Not on file  Intimate Partner Violence: Not At Risk (11/23/2021)   Humiliation, Afraid, Rape, and Kick questionnaire    Fear of Current or Ex-Partner: No    Emotionally Abused: No    Physically Abused: No    Sexually Abused: No    Review of Systems: General: Negative for anorexia, weight loss, fever, chills, fatigue, weakness. Eyes: Negative for vision changes.  ENT: Negative for hoarseness, difficulty swallowing , nasal congestion. CV: Negative for chest pain, angina, palpitations, dyspnea on exertion, peripheral edema.  Respiratory: Negative for dyspnea at rest, dyspnea on exertion, cough, sputum, wheezing.  GI: See history of present illness. GU:  Negative for dysuria, hematuria, urinary incontinence, urinary frequency, nocturnal urination.  MS: Negative for joint pain, low back pain.  Derm: Negative for rash or itching.  Neuro: Negative for weakness, abnormal sensation, seizure, frequent headaches, memory loss, confusion.  Psych: Negative for anxiety, depression Endo: Negative for unusual weight change.  Heme: Negative for bruising or bleeding. Allergy: Negative for rash or hives.  Physical Exam: Vital signs in last 24 hours: Temp:  [97.8 F (36.6 C)-98.5 F (36.9 C)] 98.2 F (36.8 C) (09/30 0700) Pulse Rate:  [64-73] 71 (09/30 1100) Resp:  [11-25] 12 (09/30 1100) BP: (60-153)/(36-73) 153/60 (09/30 1100) SpO2:  [88 %-100 %] 96 % (09/30 1100) Weight:  [80.7 kg-82.6 kg] 80.7 kg (09/30 0523) Last BM Date : 11/22/21 General:   Alert,  Well-developed, well-nourished, pleasant and cooperative in  NAD Head:  Normocephalic and atraumatic. Eyes:  Sclera clear, no icterus.   Conjunctiva pink. Ears:  Normal auditory acuity. Nose:  No deformity, discharge,  or  lesions. Mouth:  No deformity or lesions, dentition normal. Neck:  Supple; no masses or thyromegaly. Lungs:  Clear throughout to auscultation.   No wheezes, crackles, or rhonchi. No acute distress. Heart:  Regular rate and rhythm; no murmurs, clicks, rubs,  or gallops. Abdomen:  Soft, nontender and nondistended. No masses, hepatosplenomegaly or hernias noted. Normal bowel sounds, without guarding, and without rebound.   Msk:  Symmetrical without gross deformities. Normal posture. Pulses:  Normal pulses noted. Extremities:  Without clubbing or edema. Neurologic:  Alert and  oriented x4;  grossly normal neurologically. Skin:  Intact without significant lesions or rashes. Cervical Nodes:  No significant cervical adenopathy. Psych:  Alert and cooperative. Normal mood and affect.  Intake/Output from previous day: 09/29 0701 - 09/30 0700 In: 400 [IV Piggyback:400] Out: -  Intake/Output this shift: Total I/O In: 535 [I.V.:435.2; IV Piggyback:99.8] Out: -   Lab Results: Recent Labs    11/22/21 2032 11/22/21 2041 11/23/21 0554  WBC 15.3*  --  13.8*  HGB 11.9* 12.6 11.7*  HCT 36.7 37.0 36.3  PLT 186  --  187   BMET Recent Labs    11/22/21 2032 11/22/21 2041 11/23/21 0554  NA 136 135 138  K 4.8 4.4 4.2  CL 100 98 105  CO2 27  --  28  GLUCOSE 123* 120* 80  BUN 18 19 15   CREATININE 1.32* 1.30* 0.87  CALCIUM 8.1*  --  8.2*   LFT Recent Labs    11/22/21 2032 11/23/21 0554  PROT 6.2* 6.3*  ALBUMIN 3.6 3.5  AST 23 15  ALT 10 11  ALKPHOS 69 65  BILITOT 1.2 0.9   PT/INR Recent Labs    11/23/21 0554  LABPROT 14.8  INR 1.2   Hepatitis Panel No results for input(s): "HEPBSAG", "HCVAB", "HEPAIGM", "HEPBIGM" in the last 72 hours. C-Diff No results for input(s): "CDIFFTOX" in the last 72 hours.  Studies/Results: CT ABDOMEN PELVIS W CONTRAST  Result Date: 11/22/2021 CLINICAL DATA:  Abdominal pain, acute. Nausea vomiting and diarrhea all day. Dizziness. EXAM: CT  ABDOMEN AND PELVIS WITH CONTRAST TECHNIQUE: Multidetector CT imaging of the abdomen and pelvis was performed using the standard protocol following bolus administration of intravenous contrast. RADIATION DOSE REDUCTION: This exam was performed according to the departmental dose-optimization program which includes automated exposure control, adjustment of the mA and/or kV according to patient size and/or use of iterative reconstruction technique. CONTRAST:  157mL OMNIPAQUE IOHEXOL 350 MG/ML SOLN COMPARISON:  None Available. FINDINGS: Lower chest: Prominent coronary artery atherosclerotic calcifications. Bibasilar dependent atelectasis. Hepatobiliary: No focal liver abnormality is seen. Rim calcified gallstone measuring up to 2 cm. No gallbladder wall thickening, or biliary dilatation. Pancreas: Moderate atrophy of the pancreas. No pancreatic ductal dilatation or surrounding inflammatory changes. Spleen: Normal in size without focal abnormality. Adrenals/Urinary Tract: Adrenal glands are unremarkable. Kidneys are normal, without renal calculi, focal lesion, or hydronephrosis. Bladder is unremarkable. Stomach/Bowel: Stomach is within normal limits. Appendix appears normal. There is marked thickening of the descending and sigmoid colonic wall with adjacent fat stranding concerning for severe colitis (axial images 40-60; coronal images 45-56). No pneumatosis or extraluminal free air.);. Vascular/Lymphatic: Moderate aortic atherosclerosis. No enlarged abdominal or pelvic lymph nodes. Reproductive: Vaginal pessary in place.  Uterus is unremarkable. Other: No abdominal wall hernia or abnormality. No abdominopelvic ascites. Musculoskeletal: Moderate  levoscoliosis of the lumbar spine centered at L2 vertebral body. 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 Multilevel facet joint arthropathy. Status post left hip arthroplasty with intact hardware. No acute  osseous abnormality. IMPRESSION: 1. Marked thickening of the descending and sigmoid colonic wall with adjacent fat stranding concerning for severe colitis. 2.  Cholelithiasis without evidence of acute cholecystitis. 3.  Moderate pancreatic atrophy. 4. Moderate-to-severe atherosclerotic disease of abdominal aorta and branch vessels. 5. 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 Status post left hip arthroplasty with intact hardware. No acute osseous abnormality. 6.  Additional chronic findings as above. Electronically Signed   By: Keane Police D.O.   On: 11/22/2021 23:29   CT Angio Chest PE W and/or Wo Contrast  Result Date: 11/22/2021 CLINICAL DATA:  Nausea and vomiting all day; became dizzy and weak EXAM: CT ANGIOGRAPHY CHEST WITH CONTRAST TECHNIQUE: Multidetector CT imaging of the chest was performed using the standard protocol during bolus administration of intravenous contrast. Multiplanar CT image reconstructions and MIPs were obtained to evaluate the vascular anatomy. RADIATION DOSE REDUCTION: This exam was performed according to the departmental dose-optimization program which includes automated exposure control, adjustment of the mA and/or kV according to patient size and/or use of iterative reconstruction technique. CONTRAST:  121mL OMNIPAQUE IOHEXOL 350 MG/ML SOLN COMPARISON:  Radiographs earlier today and CT chest 06/05/2016 FINDINGS: Cardiovascular: Satisfactory opacification of the pulmonary arteries to the segmental level. No evidence of pulmonary embolism. Enlarged main pulmonary cardiomegaly. No pericardial effusion. Three-vessel coronary artery atherosclerosis. Mitral annular calcification. Aortic valve calcification. Aortic arch calcification. Mediastinum/Nodes: No thoracic adenopathy by size. Small left thyroid nodule not requiring follow-up. Unremarkable esophagus. Lungs/Pleura: Exam performed during expiration this may account for  some of the interlobular septal thickening and lower lobe predominant atelectasis. Diffuse interlobular septal thickening. Bronchial wall thickening and mucous plugging greatest in the lower lobes. 12.0 x 10.0 cm nodule in the posterior right upper lobe (series 9/image 49). Upper Abdomen: No acute abnormality. Musculoskeletal: No chest wall abnormality. No acute osseous findings. Review of the MIP images confirms the above findings. IMPRESSION: 1. Negative for acute pulmonary embolism. 2. Bronchial wall thickening and interlobular septal thickening may be due to edema or infection. 3. Bronchial inflammation and mucous plugging. 4. Right upper lobe solid pulmonary nodule measuring 11 mm (average diameter). Consider one of the following in 3 months for both low-risk and high-risk individuals: (a) repeat chest CT, (b) follow-up PET-CT, or (c) tissue sampling. This recommendation follows the consensus statement: Guidelines for Management of Incidental Pulmonary Nodules Detected on CT Images: From the Fleischner Society 2017; Radiology 2017; 284:228-243. 5. Three-vessel coronary atherosclerosis. 6.  Aortic Atherosclerosis (ICD10-I70.0). Electronically Signed   By: Placido Sou M.D.   On: 11/22/2021 23:29   DG Chest Portable 1 View  Result Date: 11/22/2021 CLINICAL DATA:  Nausea vomiting hypoxia EXAM: PORTABLE CHEST 1 VIEW COMPARISON:  Chest x-ray 06/10/2016 FINDINGS: Cardiomegaly. Mild bronchitic changes. No acute consolidation, pleural effusion or pneumothorax. Dextrocurvature of the spine IMPRESSION: Cardiomegaly without acute focal airspace disease Electronically Signed   By: Donavan Foil M.D.   On: 11/22/2021 21:04    Impression: *Left-sided colitis *Crohn's disease *Abdominal pain-improved *Diarrhea  Plan: Clinical picture concerning for active Crohn's flare though cannot rule out infectious causes.  Also notes taking NSAIDs twice daily for 1 to 2 weeks, so NSAID induced colitis also on  differential.  Continue antibiotics  and IV steroids.  Continue sulfasalazine.  We will check C. difficile and GI pathogen panel.  Needs to establish with GI going forward.  Patient is agreeable.  We will arrange this.  Outpatient colonoscopy pending clinical course.  Full liquid diet today.  Advance as tolerated.  Otherwise supportive care.  GI to continue to follow.  Elon Alas. Abbey Chatters, D.O. Gastroenterology and Hepatology [facility]    LOS: 0 days     11/23/2021, 11:19 AM

## 2021-11-23 NOTE — Assessment & Plan Note (Addendum)
-  Patient met sepsis criteria at time of admission with respiratory rate 25, leukocytosis of 15.3 -Excellent response to fluid resuscitation, supportive care and empiric antibiotics. -Source appears to be colitis. -At this moment no isolated microorganisms; following GI service recommendation antibiotics were discontinued and Patient on treatment with steroids and sulfasalazine for Crohn's flare. -Continue to maintain adequate hydration and follow-up as an outpatient -No acute distress and tolerating diet at time of discharge.

## 2021-11-23 NOTE — Assessment & Plan Note (Addendum)
-  Last creatinine we have in our system was from 2018 but was 0.58 -GI losses, dehydration and prerenal azotemia most likely responsible for acute kidney injury. -Creatinine has now improved from back to baseline after fluid resuscitation. -Continue to follow renal function trend at follow-up visit. -Patient advised to keep herself well-hydrated -Safe to resume home antihypertensive/diuretics agents.

## 2021-11-23 NOTE — Assessment & Plan Note (Addendum)
-  Patient initially started with cefepime, vancomycin, Flagyl -Vancomycin was discontinue; after 24 hours of treatment patient with drastic improvement and currently not demonstrating fevers, or isolated microorganisms. -After discussing with gastroenterology service they feel that patient's symptoms on presentation were driven by Crohn's flare and not bacterial infection. -Antibiotic discontinued and patient observed for 24 hours. Stable to go home and follow up with GI as an outpatient.

## 2021-11-23 NOTE — H&P (Signed)
History and Physical    Patient: Barbara Lucero QIH:[id number] DOB: 1940-12-13 DOA: 11/22/2021 DOS: the patient was seen and examined on 11/23/2021 PCP: [name], MD  Patient coming from: Home  Chief Complaint:  Chief Complaint  Patient presents with   Emesis   Diarrhea   HPI: Barbara Lucero is a 81 y.o. female with medical history significant of anxiety, depression, arthritis, Crohn's disease, hyperlipidemia, urinary incontinence, and more presents to the ED with a chief complaint of feeling dizzy.  Patient reports that it started yesterday evening when she was at a restaurant.  She is very somnolent during my exam and falls asleep during most answers, but reports that she cannot really explain what the dizziness felt like.  She has no history of vertigo.  There was no associated headache, loss of consciousness, chest pain, shortness of breath.  Patient reports that she has had a decreased appetite over a few months, but she is not sure if she has had any weight loss.  She thinks she drinks enough water at home as she "drinks all the time."  Today patient had abdominal cramping and multiple episodes of diarrhea.  There was no melena or hematochezia.  Patient reports her last normal bowel movement is unknown.  She has not been taking sulfasalazine.  Patient reports she only takes it when she thinks she needs it and this flare called her by surprise.  Patient reports she did have some nausea, but no vomiting.  Patient arrived in the ER she had a blood pressure of 60/36.  She was given sepsis bolus which corrected her pressure.  She has not required pressors at any time during her stay thus far.  Patient denies any fever, cough, dyspnea.  She reports she was in her normal state of health other than decreased appetite for couple of months and then diarrhea today.  Patient does not smoke, does not drink, does not use illicit drugs.  She is vaccinated for COVID.  Patient is full code. Review of Systems:  As mentioned in the history of present illness. All other systems reviewed and are negative. Past Medical History:  Diagnosis Date   Anxiety    Arthritis    Crohn disease (HCC)    Depression    Varicose veins of bilateral lower extremities with other complications    Past Surgical History:  Procedure Laterality Date   BOWEL RESECTION     HIP ARTHROPLASTY Left 06/06/2016   Procedure: ARTHROPLASTY BIPOLAR HIP (HEMIARTHROPLASTY);  Surgeon: Vickki Hearing, MD;  Location: [facility];  Service: Orthopedics;  Laterality: Left;   lt hip replacement     Social History:  reports that she has never smoked. She has never used smokeless tobacco. She reports that she does not drink alcohol and does not use drugs.  Allergies  Allergen Reactions   Latex Hives    Family History  Problem Relation Age of Onset   Breast cancer Sister    Breast cancer Sister    Other Son        disabled   Bipolar disorder Daughter     Prior to Admission medications   Medication Sig Start Date End Date Taking? Authorizing Provider  acetaminophen (TYLENOL) 650 MG CR tablet Take 650 mg by mouth every 8 (eight) hours as needed for pain.    [provider]  Ascorbic Acid (VITAMIN C) 1000 MG tablet Take 1,000 mg by mouth.     [provider]  b complex vitamins  tablet Take 1 tablet by mouth daily.    [provider]  CALCIUM PO Take 1 tablet by mouth daily.    [provider]  COLLAGEN PO Take by mouth as directed.    [provider]  furosemide (LASIX) 20 MG tablet Take 20 mg by mouth daily. 04/30/20   [provider]  gabapentin (NEURONTIN) 100 MG capsule Take 100 mg by mouth 3 (three) times daily.    [provider]  Multiple Vitamin (MULTIVITAMIN) tablet Take 1 tablet by mouth daily.    [provider]  MYRBETRIQ 50 MG TB24 tablet TAKE (1) TABLET BY MOUTH AT BEDTIME. 07/25/21   Florian Buff, MD  pantoprazole (PROTONIX) 40 MG tablet Take 40 mg by  mouth daily. 07/27/19   [provider]  potassium chloride 20 MEQ/15ML (10%) SOLN SMARTSIG:Milliliter(s) By Mouth 04/02/20   [provider]  Psyllium (METAMUCIL PO) Take by mouth as directed.     [provider]  sertraline (ZOLOFT) 25 MG tablet Take 25 mg by mouth as directed. 07/26/19   [provider]  simvastatin (ZOCOR) 10 MG tablet Take 10 mg by mouth at bedtime. 08/30/19   [provider]  solifenacin (VESICARE) 10 MG tablet TAKE (1) TABLET BY MOUTH AT BEDTIME. 10/09/21   Florian Buff, MD  sulfaSALAzine (AZULFIDINE) 500 MG tablet Take 500 mg by mouth 2 (two) times daily.     [provider]  vitamin E 400 UNIT capsule Take 400 Units by mouth daily.    [provider]    Physical Exam: Vitals:   11/22/21 2330 11/23/21 0100 11/23/21 0130 11/23/21 0200  BP: (!) 101/45 (!) 110/51 (!) 76/40 (!) 108/50  Pulse: 71 71 64 68  Resp: (!) 25 16 12 16   Temp:    97.9 F (36.6 C)  TempSrc:    Oral  SpO2: 98% 97% 98% 98%  Weight:      Height:       1.  General: Patient lying supine in bed,  no acute distress   2. Psychiatric: Alert and oriented x 3, mood and behavior normal for situation, pleasant and cooperative with exam   3. Neurologic: Speech and language are normal, face is symmetric, moves all 4 extremities voluntarily, at baseline without acute deficits on limited exam   4. HEENMT:  Head is atraumatic, normocephalic, pupils reactive to light, neck is supple, trachea is midline, mucous membranes are moist   5. Respiratory : Lungs are clear to auscultation bilaterally without wheezing, rhonchi, rales, no cyanosis, no increase in work of breathing or accessory muscle use   6. Cardiovascular : Heart rate normal, rhythm is regular, significant murmur present, rubs or gallops, no peripheral edema, peripheral pulses palpated   7. Gastrointestinal:  Abdomen is soft, minimally distended, nontender to palpation bowel sounds  active, no masses or organomegaly palpated   8. Skin:  Skin is warm, dry and intact without rashes, acute lesions, or ulcers on limited exam   9.Musculoskeletal:  No acute deformities or trauma, no asymmetry in tone, no peripheral edema, peripheral pulses palpated, no tenderness to palpation in the extremities  Data Reviewed: In the ED Temp 97.8, heart rate 70-71, respiratory rate 11-25, blood pressure 60/36-102/57, satting 88% on room air maintaining oxygen sats on 2 L nasal cannula Leukocytosis 15.3, hemoglobin stable 11.9, chemistry shows an elevated creatinine at 1.32 The BUN: Creatinine ratio is less than 20: 1 Negative COVID and flu Blood culture pending CT abdomen shows  colitis CTA chest shows no PE but does show edema versus infection Vancomycin, cefepime, Flagyl, Zofran given in the ED Admission requested for Crohn's colitis flare  Assessment and Plan: * Sepsis (HCC) - Respiratory rate 25, leukocytosis of 15.3 - Hypotensive at 60/36, AKI, acute respiratory failure - CT abdomen shows colitis - CT chest shows edema versus infection in the lungs - Patient started on broad-spectrum antibiotics vancomycin, cefepime, Flagyl - Blood cultures pending - Continue cefepime and Flagyl, is not an indication for vancomycin at this time - Negative COVID and flu - We will monitor  GERD (gastroesophageal reflux disease) - Continue Protonix  AKI (acute kidney injury) (HCC) - Last creatinine we have in our system was from 2018 but was 0.58 - Today creatinine 1.32 - Given that the patient is septic, we will assume that this is an AKI and continue IV hydration - Trend in the a.m.  Colitis - Patient initially started with cefepime, vancomycin, Flagyl - Continue cefepime and Flagyl - Continue sulfasalazine - Consult GI for Crohn's colitis flare - Solu-Medrol given - Continue IV hydration - CT abdomen shows colitis - Blood cultures pending - Continue monitor  Crohn's disease  (HCC) - History of Crohn's - Has not been taking her sulfasalazine - Restart sulfasalazine - Start steroid - Consult GI - Continue to monitor  Anxiety and depression - Continue Zoloft      Advance Care Planning:   Code Status: Prior full  Consults: Gastro  Family Communication: No family at bedside  Severity of Illness: The appropriate patient status for this patient is INPATIENT. Inpatient status is judged to be reasonable and necessary in order to provide the required intensity of service to ensure the patient's safety. The patient's presenting symptoms, physical exam findings, and initial radiographic and laboratory data in the context of their chronic comorbidities is felt to place them at high risk for further clinical deterioration. Furthermore, it is not anticipated that the patient will be medically stable for discharge from the hospital within 2 midnights of admission.   * I certify that at the point of admission it is my clinical judgment that the patient will require inpatient hospital care spanning beyond 2 midnights from the point of admission due to high intensity of service, high risk for further deterioration and high frequency of surveillance required.*  Author: Lilyan Gilford, DO 11/23/2021 2:27 AM  For on call review [email or web address].

## 2021-11-24 DIAGNOSIS — K529 Noninfective gastroenteritis and colitis, unspecified: Secondary | ICD-10-CM | POA: Diagnosis not present

## 2021-11-24 DIAGNOSIS — A419 Sepsis, unspecified organism: Secondary | ICD-10-CM | POA: Diagnosis not present

## 2021-11-24 DIAGNOSIS — R1032 Left lower quadrant pain: Secondary | ICD-10-CM

## 2021-11-24 DIAGNOSIS — N179 Acute kidney failure, unspecified: Secondary | ICD-10-CM | POA: Diagnosis not present

## 2021-11-24 DIAGNOSIS — F419 Anxiety disorder, unspecified: Secondary | ICD-10-CM | POA: Diagnosis not present

## 2021-11-24 DIAGNOSIS — R197 Diarrhea, unspecified: Secondary | ICD-10-CM

## 2021-11-24 LAB — BASIC METABOLIC PANEL
Anion gap: 7 (ref 5–15)
BUN: 11 mg/dL (ref 8–23)
CO2: 28 mmol/L (ref 22–32)
Calcium: 7.9 mg/dL — ABNORMAL LOW (ref 8.9–10.3)
Chloride: 106 mmol/L (ref 98–111)
Creatinine, Ser: 0.72 mg/dL (ref 0.44–1.00)
GFR, Estimated: >60 mL/min (ref >60)
Glucose, Bld: 90 mg/dL (ref 70–99)
Potassium: 4.1 mmol/L (ref 3.5–5.1)
Sodium: 141 mmol/L (ref 135–145)

## 2021-11-24 LAB — CBC
HCT: 31.5 % — ABNORMAL LOW (ref 36.0–46.0)
Hemoglobin: 10.3 g/dL — ABNORMAL LOW (ref 12.0–15.0)
MCH: 30.1 pg (ref 26.0–34.0)
MCHC: 32.7 g/dL (ref 30.0–36.0)
MCV: 92.1 fL (ref 80.0–100.0)
Platelets: 155 10*3/uL (ref 150–400)
RBC: 3.42 MIL/uL — ABNORMAL LOW (ref 3.87–5.11)
RDW: 13.1 % (ref 11.5–15.5)
WBC: 11.9 10*3/uL — ABNORMAL HIGH (ref 4.0–10.5)
nRBC: 0 % (ref 0.0–0.2)

## 2021-11-24 MED ORDER — ALUM & MAG HYDROXIDE-SIMETH 200-200-20 MG/5ML PO SUSP
30.0000 mL | Freq: Four times a day (QID) | ORAL | Status: DC | PRN
  Administered 2021-11-24: 30 mL via ORAL
  Filled 2021-11-24: qty 30

## 2021-11-24 NOTE — Progress Notes (Signed)
Subjective: Patient notes feeling much better today.  No diarrhea in 2 days.  No melena hematochezia.  Abdominal pain improved.  Tolerating diet.  Objective: Vital signs in last 24 hours: Temp:  [97.7 F (36.5 C)-98.1 F (36.7 C)] 97.8 F (36.6 C) (10/01 0320) Pulse Rate:  [65-77] 74 (10/01 1000) Resp:  [11-23] 15 (10/01 1000) BP: (116-170)/(41-83) 151/45 (10/01 1000) SpO2:  [89 %-100 %] 98 % (10/01 1000) Weight:  [85 kg] 85 kg (10/01 0320) Last BM Date : 11/22/21 General:   Alert and oriented, pleasant Head:  Normocephalic and atraumatic. Eyes:  No icterus, sclera clear. Conjuctiva pink.  Abdomen:  Bowel sounds present, soft, non-tender, non-distended. No HSM or hernias noted. No rebound or guarding. No masses appreciated  Msk:  Symmetrical without gross deformities. Normal posture. Extremities:  Without clubbing or edema. Neurologic:  Alert and  oriented x4;  grossly normal neurologically. Skin:  Warm and dry, intact without significant lesions.  Cervical Nodes:  No significant cervical adenopathy. Psych:  Alert and cooperative. Normal mood and affect.  Intake/Output from previous day: 09/30 0701 - 10/01 0700 In: 1284.6 [P.O.:270; I.V.:814.8; IV Piggyback:199.8] Out: 1400 [Urine:1400] Intake/Output this shift: Total I/O In: 1402 [I.V.:1202; IV Piggyback:200] Out: 450 [Urine:450]  Lab Results: Recent Labs    11/22/21 2032 11/22/21 2041 11/23/21 0554 11/24/21 0426  WBC 15.3*  --  13.8* 11.9*  HGB 11.9* 12.6 11.7* 10.3*  HCT 36.7 37.0 36.3 31.5*  PLT 186  --  187 155   BMET Recent Labs    11/22/21 2032 11/22/21 2041 11/23/21 0554  NA 136 135 138  K 4.8 4.4 4.2  CL 100 98 105  CO2 27  --  28  GLUCOSE 123* 120* 80  BUN 18 19 15   CREATININE 1.32* 1.30* 0.87  CALCIUM 8.1*  --  8.2*   LFT Recent Labs    11/22/21 2032 11/23/21 0554  PROT 6.2* 6.3*  ALBUMIN 3.6 3.5  AST 23 15  ALT 10 11  ALKPHOS 69 65  BILITOT 1.2 0.9   PT/INR Recent Labs     11/23/21 0554  LABPROT 14.8  INR 1.2   Hepatitis Panel No results for input(s): "HEPBSAG", "HCVAB", "HEPAIGM", "HEPBIGM" in the last 72 hours.   Studies/Results: CT ABDOMEN PELVIS W CONTRAST  Result Date: 11/22/2021 CLINICAL DATA:  Abdominal pain, acute. Nausea vomiting and diarrhea all day. Dizziness. EXAM: CT ABDOMEN AND PELVIS WITH CONTRAST TECHNIQUE: Multidetector CT imaging of the abdomen and pelvis was performed using the standard protocol following bolus administration of intravenous contrast. RADIATION DOSE REDUCTION: This exam was performed according to the departmental dose-optimization program which includes automated exposure control, adjustment of the mA and/or kV according to patient size and/or use of iterative reconstruction technique. CONTRAST:  12mL OMNIPAQUE IOHEXOL 350 MG/ML SOLN COMPARISON:  None Available. FINDINGS: Lower chest: Prominent coronary artery atherosclerotic calcifications. Bibasilar dependent atelectasis. Hepatobiliary: No focal liver abnormality is seen. Rim calcified gallstone measuring up to 2 cm. No gallbladder wall thickening, or biliary dilatation. Pancreas: Moderate atrophy of the pancreas. No pancreatic ductal dilatation or surrounding inflammatory changes. Spleen: Normal in size without focal abnormality. Adrenals/Urinary Tract: Adrenal glands are unremarkable. Kidneys are normal, without renal calculi, focal lesion, or hydronephrosis. Bladder is unremarkable. Stomach/Bowel: Stomach is within normal limits. Appendix appears normal. There is marked thickening of the descending and sigmoid colonic wall with adjacent fat stranding concerning for severe colitis (axial images 40-60; coronal images 45-56). No pneumatosis or extraluminal free air.);. Vascular/Lymphatic: Moderate aortic atherosclerosis.  No enlarged abdominal or pelvic lymph nodes. Reproductive: Vaginal pessary in place.  Uterus is unremarkable. Other: No abdominal wall hernia or abnormality. No  abdominopelvic ascites. Musculoskeletal: Moderate levoscoliosis of the lumbar spine centered at L2 vertebral body. 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 Multilevel facet joint arthropathy. Status post left hip arthroplasty with intact hardware. No acute osseous abnormality. IMPRESSION: 1. Marked thickening of the descending and sigmoid colonic wall with adjacent fat stranding concerning for severe colitis. 2.  Cholelithiasis without evidence of acute cholecystitis. 3.  Moderate pancreatic atrophy. 4. Moderate-to-severe atherosclerotic disease of abdominal aorta and branch vessels. 5. 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 Status post left hip arthroplasty with intact hardware. No acute osseous abnormality. 6.  Additional chronic findings as above. Electronically Signed   By: Keane Police D.O.   On: 11/22/2021 23:29   CT Angio Chest PE W and/or Wo Contrast  Result Date: 11/22/2021 CLINICAL DATA:  Nausea and vomiting all day; became dizzy and weak EXAM: CT ANGIOGRAPHY CHEST WITH CONTRAST TECHNIQUE: Multidetector CT imaging of the chest was performed using the standard protocol during bolus administration of intravenous contrast. Multiplanar CT image reconstructions and MIPs were obtained to evaluate the vascular anatomy. RADIATION DOSE REDUCTION: This exam was performed according to the departmental dose-optimization program which includes automated exposure control, adjustment of the mA and/or kV according to patient size and/or use of iterative reconstruction technique. CONTRAST:  121mL OMNIPAQUE IOHEXOL 350 MG/ML SOLN COMPARISON:  Radiographs earlier today and CT chest 06/05/2016 FINDINGS: Cardiovascular: Satisfactory opacification of the pulmonary arteries to the segmental level. No evidence of pulmonary embolism. Enlarged main pulmonary cardiomegaly. No pericardial  effusion. Three-vessel coronary artery atherosclerosis. Mitral annular calcification. Aortic valve calcification. Aortic arch calcification. Mediastinum/Nodes: No thoracic adenopathy by size. Small left thyroid nodule not requiring follow-up. Unremarkable esophagus. Lungs/Pleura: Exam performed during expiration this may account for some of the interlobular septal thickening and lower lobe predominant atelectasis. Diffuse interlobular septal thickening. Bronchial wall thickening and mucous plugging greatest in the lower lobes. 12.0 x 10.0 cm nodule in the posterior right upper lobe (series 9/image 49). Upper Abdomen: No acute abnormality. Musculoskeletal: No chest wall abnormality. No acute osseous findings. Review of the MIP images confirms the above findings. IMPRESSION: 1. Negative for acute pulmonary embolism. 2. Bronchial wall thickening and interlobular septal thickening may be due to edema or infection. 3. Bronchial inflammation and mucous plugging. 4. Right upper lobe solid pulmonary nodule measuring 11 mm (average diameter). Consider one of the following in 3 months for both low-risk and high-risk individuals: (a) repeat chest CT, (b) follow-up PET-CT, or (c) tissue sampling. This recommendation follows the consensus statement: Guidelines for Management of Incidental Pulmonary Nodules Detected on CT Images: From the Fleischner Society 2017; Radiology 2017; 284:228-243. 5. Three-vessel coronary atherosclerosis. 6.  Aortic Atherosclerosis (ICD10-I70.0). Electronically Signed   By: Placido Sou M.D.   On: 11/22/2021 23:29   DG Chest Portable 1 View  Result Date: 11/22/2021 CLINICAL DATA:  Nausea vomiting hypoxia EXAM: PORTABLE CHEST 1 VIEW COMPARISON:  Chest x-ray 06/10/2016 FINDINGS: Cardiomegaly. Mild bronchitic changes. No acute consolidation, pleural effusion or pneumothorax. Dextrocurvature of the spine IMPRESSION: Cardiomegaly without acute focal airspace disease Electronically Signed   By: Donavan Foil M.D.   On: 11/22/2021 21:04    Impression: *Left-sided colitis *Crohn's disease *Abdominal pain-improved *Diarrhea   Plan: Clinical picture concerning for active Crohn's  flare.  Also notes taking NSAIDs twice daily for 1 to 2 weeks, so NSAID induced colitis also on differential.  No diarrhea in 48 hours.  Infectious etiology unlikely.  Okay to DC antibiotics.  Continue on IV Solu-Medrol 60 mg daily.  Recommend 4-week taper upon discharge prednisone 40 mg daily x1 week, 30 mg daily x1 week, 20 mg daily x1 week, 10 mg daily x1 week.   Continue sulfasalazine.   Needs to establish with GI going forward.  Patient is agreeable.  We will arrange this.   Outpatient colonoscopy pending clinical course.   Advance to soft diet today.   Otherwise supportive care.  GI to sign off, please call with any questions concerns.  Elon Alas. Abbey Chatters, D.O. Gastroenterology and Hepatology [facility]   LOS: 1 day    11/24/2021, 11:15 AM

## 2021-11-24 NOTE — Plan of Care (Signed)
  Problem: Acute Rehab PT Goals(only PT should resolve) Goal: Patient Will Transfer Sit To/From Stand Outcome: Progressing Flowsheets (Taken 11/24/2021 1044) Patient will transfer sit to/from stand: with min guard assist Goal: Pt Will Transfer Bed To Chair/Chair To Bed Outcome: Progressing Flowsheets (Taken 11/24/2021 1044) Pt will Transfer Bed to Chair/Chair to Bed: min guard assist Goal: Pt Will Ambulate Outcome: Progressing Flowsheets (Taken 11/24/2021 1044) Pt will Ambulate:  50 feet  with min guard assist  with rolling walker Goal: Pt/caregiver will Perform Home Exercise Program Outcome: Progressing Flowsheets (Taken 11/24/2021 1044) Pt/caregiver will Perform Home Exercise Program:  For increased strengthening  For improved balance  Independently  10:44 AM, 11/24/21 Mearl Latin PT, DPT Physical Therapist at [facility]

## 2021-11-24 NOTE — Assessment & Plan Note (Signed)
-   Resolved -Most likely secondary to Crohn's flare -Patient reaching almost 72 hours without diarrhea now.

## 2021-11-24 NOTE — Progress Notes (Signed)
Progress Note   Patient: Barbara Lucero DOB: May 18, 1940 DOA: 11/22/2021     1 DOS: the patient was seen and examined on 11/24/2021   Brief hospital course: As per H&P written by Dr. Clearence Ped on 11/23/21 Barbara Lucero is a 81 y.o. female with medical history significant of anxiety, depression, arthritis, Crohn's disease, hyperlipidemia, urinary incontinence, and more presents to the ED with a chief complaint of feeling dizzy.  Patient reports that it started yesterday evening when she was at a restaurant.  She is very somnolent during my exam and falls asleep during most answers, but reports that she cannot really explain what the dizziness felt like.  She has no history of vertigo.  There was no associated headache, loss of consciousness, chest pain, shortness of breath.  Patient reports that she has had a decreased appetite over a few months, but she is not sure if she has had any weight loss.  She thinks she drinks enough water at home as she "drinks all the time."  Today patient had abdominal cramping and multiple episodes of diarrhea.  There was no melena or hematochezia.  Patient reports her last normal bowel movement is unknown.  She has not been taking sulfasalazine.  Patient reports she only takes it when she thinks she needs it and this flare called her by surprise.  Patient reports she did have some nausea, but no vomiting.  Patient arrived in the ER she had a blood pressure of 60/36.  She was given sepsis bolus which corrected her pressure.  She has not required pressors at any time during her stay thus far.  Patient denies any fever, cough, dyspnea.  She reports she was in her normal state of health other than decreased appetite for couple of months and then diarrhea today.   Patient does not smoke, does not drink, does not use illicit drugs.  She is vaccinated for COVID.  Patient is full code.  Assessment and Plan: * Sepsis ([place]) - Patient met sepsis criteria at time of  admission with respiratory rate 25, leukocytosis of 15.3 -Excellent response to fluid resuscitation, supportive care and empiric antibiotics. -Source appears to be colitis. -At this moment no isolated microorganisms; following GI service recommendation we will stop antibiotics and continue treatment with steroids and sulfasalazine for Crohn's flare. -Continue supportive care -Hopefully discharge in the next 24 hours.  Left lower quadrant abdominal pain - In the setting of colitis from Crohn's flare -Continue supportive care, adequate hydration and as needed analgesics. -Adequately improving and responding to treatment.  Diarrhea - Resolved -Most likely secondary to Crohn's flare -Patient reaching almost 72 hours without diarrhea now.  GERD (gastroesophageal reflux disease) -Continue Protonix  AKI (acute kidney injury) ([place]) - Last creatinine we have in our system was from 2018 but was 0.58 - GI losses, dehydration and prerenal azotemia most likely responsible for acute kidney injury. -Creatinine has now improved from back to baseline after fluid resuscitation -Continue to follow renal function trend.  Colitis - Patient initially started with cefepime, vancomycin, Flagyl - Vancomycin was discontinue; after 24 hours of treatment patient with drastic improvement and currently not demonstrating fevers, or isolated microorganisms. -After discussing with gastroenterology service they feel that patient's symptoms on presentation were driven by Crohn's flare. -Antibiotic has been now discontinued -Patient will be observed for another 24 hours receiving chills supportive care and steroids -Continue advancing diet as tolerated.  Crohn's disease ([place]) - History of Crohn's - Patient had not been taking  her medications adequately or actively following with GI as an outpatient. -Education provided; GI service will plan for outpatient follow-up -Continue sulfasalazine, continue advancing diet,  continue supportive care and continue IV steroids for another 24 hours. -Anticipated discharge home in the next 24 hours if patient remains stable with steroids tapering.  Anxiety and depression - Continue Zoloft -Mood stable overall.   Subjective:  Overall feeling much better; no chest pain, no nausea, no vomiting, no melena or hematochezia.  Reports still having some nausea overnight and very little abdominal discomfort.  So far tolerating diet.  No diarrhea in the last 2 days.  Hemodynamically stable.  Physical Exam: Vitals:   11/24/21 0600 11/24/21 0800 11/24/21 1000 11/24/21 1200  BP: (!) 134/49 (!) 166/59 (!) 151/45 (!) 159/82  Pulse: 66 69 74 68  Resp: _0 Temp:      TempSrc:      SpO2: 99% 98% 98% 99%  Weight:      Height:       General exam: Alert, awake, oriented x 3; no chest pain or complaints of shortness of breath.  Patient is afebrile.  Hemodynamically stable.  Reporting feeling better.  Reports very little nausea overnight but has been able to tolerate diet. Respiratory system: Clear to auscultation. Respiratory effort normal.  No using accessory muscle. Cardiovascular system:RRR. No murmurs, rubs, gallops.  No JVD. Gastrointestinal system: Abdomen is nondistended, soft and nontender. No organomegaly or masses felt. Normal bowel sounds heard. Central nervous system: Alert and oriented. No focal neurological deficits. Extremities: No cyanosis or clubbing. Skin: No petechiae. Psychiatry: Judgement and insight appear normal. Mood & affect appropriate.   Data Reviewed: CBC: WBCs 11.9, hemoglobin 10.3 and platelet count 726 K -Basic metabolic panel: Sodium 203, potassium 4.1, chloride 106, bicarb 28, BUN 11, creatinine 0.72 and GFR more than 60.  Family Communication: No family at bedside.  Disposition: Status is: Inpatient Remains inpatient appropriate because: Continue treatment for Crohn's flare.   Planned Discharge Destination:  Home   Author: Barton Dubois, MD 11/24/2021 4:44 PM  For on call review [email or web address].

## 2021-11-24 NOTE — Assessment & Plan Note (Signed)
-   In the setting of colitis from Crohn's flare -Continue supportive care, adequate hydration and as needed analgesics. -Adequately improving and responding to treatment.

## 2021-11-24 NOTE — Evaluation (Signed)
Physical Therapy Evaluation Patient Details Name: Barbara Lucero MRN: [id number] DOB: 21-Aug-1940 Today's Date: 11/24/2021  History of Present Illness  Barbara Lucero is a 81 y.o. female with medical history significant of anxiety, depression, arthritis, Crohn's disease, hyperlipidemia, urinary incontinence, and more presents to the ED with a chief complaint of feeling dizzy.  Patient reports that it started yesterday evening when she was at a restaurant.  She is very somnolent during my exam and falls asleep during most answers, but reports that she cannot really explain what the dizziness felt like.  She has no history of vertigo.  There was no associated headache, loss of consciousness, chest pain, shortness of breath.  Patient reports that she has had a decreased appetite over a few months, but she is not sure if she has had any weight loss.  She thinks she drinks enough water at home as she "drinks all the time."  Today patient had abdominal cramping and multiple episodes of diarrhea.  There was no melena or hematochezia.  Patient reports her last normal bowel movement is unknown.  She has not been taking sulfasalazine.  Patient reports she only takes it when she thinks she needs it and this flare called her by surprise.  Patient reports she did have some nausea, but no vomiting.  Patient arrived in the ER she had a blood pressure of 60/36.  She was given sepsis bolus which corrected her pressure.  She has not required pressors at any time during her stay thus far.  Patient denies any fever, cough, dyspnea.  She reports she was in her normal state of health other than decreased appetite for couple of months and then diarrhea today.   Clinical Impression  Patient limited for functional mobility as stated below secondary to BLE weakness, fatigue and impaired standing balance. Patient requires min assist to pull to seated EOB. She transfers to standing with increased time, assist, and use of RW. She  demonstrates good standing balance with use of RW. Patient uses RW to ambulate to chair without loss of balance and ends session seated in chair. Patient will benefit from continued physical therapy in hospital and recommended venue below to increase strength, balance, endurance for safe ADLs and gait.        Recommendations for follow up therapy are one component of a multi-disciplinary discharge planning process, led by the attending physician.  Recommendations may be updated based on patient status, additional functional criteria and insurance authorization.  Follow Up Recommendations Home health PT      Assistance Recommended at Discharge Intermittent Supervision/Assistance  Patient can return home with the following  A little help with walking and/or transfers;A little help with bathing/dressing/bathroom;Assistance with cooking/housework;Assist for transportation    Equipment Recommendations None recommended by PT  Recommendations for Other Services       Functional Status Assessment Patient has had a recent decline in their functional status and demonstrates the ability to make significant improvements in function in a reasonable and predictable amount of time.     Precautions / Restrictions Precautions Precautions: Fall Restrictions Weight Bearing Restrictions: No      Mobility  Bed Mobility Overal bed mobility: Needs Assistance Bed Mobility: Supine to Sit     Supine to sit: Min assist     General bed mobility comments: assist to pull to seated EOB    Transfers Overall transfer level: Needs assistance Equipment used: Rolling walker (2 wheels) Transfers: Sit to/from Stand, Bed to chair/wheelchair/BSC Sit to Stand:  Min guard, Min assist   Step pivot transfers: Min guard, Min assist       General transfer comment: slow transition to standing with RW    Ambulation/Gait Ambulation/Gait assistance: Min guard, Min assist Gait Distance (Feet): 6 Feet Assistive  device: Rolling walker (2 wheels) Gait Pattern/deviations: Decreased stride length Gait velocity: decreased     General Gait Details: slow cadence to ambulate to chair with RW  Stairs            Wheelchair Mobility    Modified Rankin (Stroke Patients Only)       Balance Overall balance assessment: Needs assistance Sitting-balance support: Bilateral upper extremity supported, Feet unsupported Sitting balance-Leahy Scale: Good Sitting balance - Comments: seated EOB   Standing balance support: Bilateral upper extremity supported Standing balance-Leahy Scale: Fair Standing balance comment: good/fair with RW                             Pertinent Vitals/Pain Pain Assessment Pain Assessment: No/denies pain    Home Living Family/patient expects to be discharged to:: Private residence Living Arrangements: Children Available Help at Discharge: Family;Available 24 hours/day Type of Home: House Home Access: Ramped entrance       Home Layout: One level Home Equipment: Conservation officer, nature (2 wheels);Cane - single point;Wheelchair - manual      Prior Function Prior Level of Function : Independent/Modified Independent             Mobility Comments: states short distance community ambulation with RW, uses cart at grocery stores ADLs Comments: states independent with basic ADL     Hand Dominance        Extremity/Trunk Assessment   Upper Extremity Assessment Upper Extremity Assessment: Overall WFL for tasks assessed    Lower Extremity Assessment Lower Extremity Assessment: Generalized weakness    Cervical / Trunk Assessment Cervical / Trunk Assessment: Normal  Communication   Communication: No difficulties  Cognition Arousal/Alertness: Awake/alert Behavior During Therapy: WFL for tasks assessed/performed Overall Cognitive Status: Within Functional Limits for tasks assessed                                          General  Comments      Exercises     Assessment/Plan    PT Assessment Patient needs continued PT services  PT Problem List Decreased strength;Decreased mobility;Decreased activity tolerance;Decreased balance       PT Treatment Interventions DME instruction;Therapeutic exercise;Gait training;Balance training;Stair training;Neuromuscular re-education;Functional mobility training;Therapeutic activities;Patient/family education    PT Goals (Current goals can be found in the Care Plan section)  Acute Rehab PT Goals Patient Stated Goal: return home with daughter to assist PT Goal Formulation: With patient Time For Goal Achievement: 12/08/21 Potential to Achieve Goals: Good    Frequency Min 3X/week     Co-evaluation               AM-PAC PT "6 Clicks" Mobility  Outcome Measure Help needed turning from your back to your side while in a flat bed without using bedrails?: A Little Help needed moving from lying on your back to sitting on the side of a flat bed without using bedrails?: A Little Help needed moving to and from a bed to a chair (including a wheelchair)?: A Little Help needed standing up from a chair using your arms (e.g., wheelchair or  bedside chair)?: A Little Help needed to walk in hospital room?: A Little Help needed climbing 3-5 steps with a railing? : A Lot 6 Click Score: 17    End of Session Equipment Utilized During Treatment: Oxygen Activity Tolerance: Patient tolerated treatment well;Patient limited by fatigue Patient left: in chair;with call bell/phone within reach Nurse Communication: Mobility status PT Visit Diagnosis: Unsteadiness on feet (R26.81);Other abnormalities of gait and mobility (R26.89);Muscle weakness (generalized) (M62.81)    Time: 6389-3734 PT Time Calculation (min) (ACUTE ONLY): 14 min   Charges:   PT Evaluation $PT Eval Low Complexity: 1 Low PT Treatments $Therapeutic Activity: 8-22 mins       10:43 AM, 11/24/21 Mearl Latin PT,  DPT Physical Therapist at [facility]

## 2021-11-25 ENCOUNTER — Telehealth: Payer: Self-pay | Admitting: Gastroenterology

## 2021-11-25 DIAGNOSIS — N179 Acute kidney failure, unspecified: Secondary | ICD-10-CM | POA: Diagnosis not present

## 2021-11-25 DIAGNOSIS — E861 Hypovolemia: Secondary | ICD-10-CM

## 2021-11-25 DIAGNOSIS — I9589 Other hypotension: Secondary | ICD-10-CM

## 2021-11-25 DIAGNOSIS — F419 Anxiety disorder, unspecified: Secondary | ICD-10-CM | POA: Diagnosis not present

## 2021-11-25 DIAGNOSIS — K50919 Crohn's disease, unspecified, with unspecified complications: Secondary | ICD-10-CM | POA: Diagnosis not present

## 2021-11-25 DIAGNOSIS — A419 Sepsis, unspecified organism: Secondary | ICD-10-CM | POA: Diagnosis not present

## 2021-11-25 DIAGNOSIS — R1032 Left lower quadrant pain: Secondary | ICD-10-CM

## 2021-11-25 MED ORDER — SULFASALAZINE 500 MG PO TABS: 500.0000 mg | ORAL_TABLET | Freq: Two times a day (BID) | ORAL | 2 refills | Status: AC

## 2021-11-25 MED ORDER — PREDNISONE 10 MG PO TABS: ORAL_TABLET | ORAL | 0 refills | Status: DC

## 2021-11-25 MED ORDER — GABAPENTIN 100 MG PO CAPS: 100.0000 mg | ORAL_CAPSULE | Freq: Three times a day (TID) | ORAL | 1 refills | Status: DC

## 2021-11-25 NOTE — Discharge Summary (Signed)
Physician Discharge Summary   Patient: Barbara Lucero MRN: [id number] DOB: 1940/12/20  Admit date:     11/22/2021  Discharge date: 11/25/21  Discharge Physician: Barton Dubois   PCP: Celene Squibb, MD   Recommendations at discharge:  Make sure patient follow-up with gastroenterology as instructed Continue assisting with management of chronic osteoarthritis while avoiding the use of NSAIDs Reassess blood pressure and adjust antihypertensive regimen as needed Repeat basic metabolic panel to follow electrolytes and renal function   Discharge Diagnoses: Principal Problem:   Sepsis ([place]) Active Problems:   Anxiety and depression   Crohn's disease ([place])   Noninfectious gastroenteritis   AKI (acute kidney injury) ([place])   GERD (gastroesophageal reflux disease)   Diarrhea   Left lower quadrant abdominal pain   Hypotension due to hypovolemia  Brief Hospital Course: As per H&P written by Dr. Clearence Ped on 11/23/21 Barbara Lucero is a 81 y.o. female with medical history significant of anxiety, depression, arthritis, Crohn's disease, hyperlipidemia, urinary incontinence, and more presents to the ED with a chief complaint of feeling dizzy.  Patient reports that it started yesterday evening when she was at a restaurant.  She is very somnolent during my exam and falls asleep during most answers, but reports that she cannot really explain what the dizziness felt like.  She has no history of vertigo.  There was no associated headache, loss of consciousness, chest pain, shortness of breath.  Patient reports that she has had a decreased appetite over a few months, but she is not sure if she has had any weight loss.  She thinks she drinks enough water at home as she "drinks all the time."  Today patient had abdominal cramping and multiple episodes of diarrhea.  There was no melena or hematochezia.  Patient reports her last normal bowel movement is unknown.  She has not been taking sulfasalazine.  Patient  reports she only takes it when she thinks she needs it and this flare called her by surprise.  Patient reports she did have some nausea, but no vomiting.  Patient arrived in the ER she had a blood pressure of 60/36.  She was given sepsis bolus which corrected her pressure.  She has not required pressors at any time during her stay thus far.  Patient denies any fever, cough, dyspnea.  She reports she was in her normal state of health other than decreased appetite for couple of months and then diarrhea today.   Patient does not smoke, does not drink, does not use illicit drugs.  She is vaccinated for COVID.  Patient is full code.  Assessment and Plan: * Sepsis ([place]) -Patient met sepsis criteria at time of admission with respiratory rate 25, leukocytosis of 15.3 -Excellent response to fluid resuscitation, supportive care and empiric antibiotics. -Source appears to be colitis. -At this moment no isolated microorganisms; following GI service recommendation antibiotics were discontinued and Patient on treatment with steroids and sulfasalazine for Crohn's flare. -Continue to maintain adequate hydration and follow-up as an outpatient -No acute distress and tolerating diet at time of discharge.   Hypotension due to hypovolemia -Resolved -Safe to resume home antihypertensive agents -Patient advised to keep herself well-hydrated.  Left lower quadrant abdominal pain - In the setting of colitis from Crohn's flare -Continue supportive care, adequate hydration and as needed analgesics. -Adequately improving and responding to treatment.  Diarrhea - Resolved -Most likely secondary to Crohn's flare -Patient reaching almost 72 hours without diarrhea now.  GERD (gastroesophageal reflux disease) -Continue  Protonix  AKI (acute kidney injury) ([place]) -Last creatinine we have in our system was from 2018 but was 0.58 -GI losses, dehydration and prerenal azotemia most likely responsible for acute kidney  injury. -Creatinine has now improved from back to baseline after fluid resuscitation. -Continue to follow renal function trend at follow-up visit. -Patient advised to keep herself well-hydrated -Safe to resume home antihypertensive/diuretics agents.  Noninfectious gastroenteritis -Patient initially started with cefepime, vancomycin, Flagyl -Vancomycin was discontinue; after 24 hours of treatment patient with drastic improvement and currently not demonstrating fevers, or isolated microorganisms. -After discussing with gastroenterology service they feel that patient's symptoms on presentation were driven by Crohn's flare and not bacterial infection. -Antibiotic discontinued and patient observed for 24 hours. Stable to go home and follow up with GI as an outpatient.  Crohn's disease ([place]) - History of Crohn's - Patient had not been taking her medications adequately or actively following with GI as an outpatient. -Education provided; GI service will plan for outpatient colonoscopy and follow-up. -Continue sulfasalazine and steroids tapering.    Anxiety and depression -Continue Zoloft -Mood stable overall.  Physical deconditioning: -Patient evaluated by physical therapy with recommendation for home health services at discharge to assist with transition of care and rehabilitation.   Consultants: GI Procedures performed: See below for x-ray reports. Disposition: Home with home health services. Diet recommendation: Heart healthy diet.  DISCHARGE MEDICATION: Allergies as of 11/25/2021       Reactions   Coconut (cocos Nucifera) Hives   Latex Hives        Medication List     STOP taking these medications    ibuprofen 200 MG tablet Commonly known as: ADVIL       TAKE these medications    acetaminophen 650 MG CR tablet Commonly known as: TYLENOL Take 650 mg by mouth every 8 (eight) hours as needed for pain.   b complex vitamins tablet Take 1 tablet by mouth daily.    CALCIUM PO Take 1 tablet by mouth daily.   COLLAGEN PO Take 1 tablet by mouth daily.   furosemide 20 MG tablet Commonly known as: LASIX Take 20 mg by mouth daily.   gabapentin 100 MG capsule Commonly known as: NEURONTIN Take 1 capsule (100 mg total) by mouth 3 (three) times daily. What changed: Another medication with the same name was removed. Continue taking this medication, and follow the directions you see here.   METAMUCIL PO Take 1 Package by mouth daily as needed (constipation).   Myrbetriq 50 MG Tb24 tablet Generic drug: mirabegron ER TAKE (1) TABLET BY MOUTH AT BEDTIME. What changed: See the new instructions.   olmesartan 40 MG tablet Commonly known as: BENICAR Take 40 mg by mouth daily.   pantoprazole 40 MG tablet Commonly known as: PROTONIX Take 40 mg by mouth daily.   predniSONE 10 MG tablet Commonly known as: DELTASONE Take 40 mg by mouth daily x1 week; then 30 mg by mouth daily x1 week; then 20 mg by mouth daily x1 week; then 10 mg by mouth daily x1 week and stop prednisone.   sertraline 100 MG tablet Commonly known as: ZOLOFT Take 100 mg by mouth daily.   simvastatin 10 MG tablet Commonly known as: ZOCOR Take 10 mg by mouth at bedtime.   solifenacin 10 MG tablet Commonly known as: VESICARE TAKE (1) TABLET BY MOUTH AT BEDTIME. What changed: See the new instructions.   sulfaSALAzine 500 MG tablet Commonly known as: AZULFIDINE Take 1 tablet (500 mg total) by  mouth 2 (two) times daily.   vitamin C 1000 MG tablet Take 1,000 mg by mouth.   vitamin E 180 MG (400 UNITS) capsule Take 400 Units by mouth daily.        Follow-up Information     Celene Squibb, MD. Schedule an appointment as soon as possible for a visit in 2 week(s).   Specialty: Internal Medicine Contact information: [place] [address] [phone]                Discharge Exam: [place] [employer]   11/22/21 2137 11/23/21 0523 11/24/21 0320  Weight:  82.6 kg 80.7 kg 85 kg   General exam: Alert, awake, oriented x 3; no chest pain or complaints of shortness of breath.  Patient is afebrile and Hemodynamically stable. Reports no nausea, no vomiting, tolerating diet and feeling ready to go home. Respiratory system: Clear to auscultation. Respiratory effort normal.  No using accessory muscle. Cardiovascular system:RRR. No murmurs, rubs, gallops.  No JVD. Gastrointestinal system: Abdomen is nondistended, soft and nontender. No organomegaly or masses felt. Normal bowel sounds heard. Central nervous system: Alert and oriented. No focal neurological deficits. Extremities: No cyanosis or clubbing. Skin: No petechiae. Psychiatry: Judgement and insight appear normal. Mood & affect appropriate.   Condition at discharge: Stable and improved.  The results of significant diagnostics from this hospitalization (including imaging, microbiology, ancillary and laboratory) are listed below for reference.   Imaging Studies: CT ABDOMEN PELVIS W CONTRAST  Result Date: 11/22/2021 CLINICAL DATA:  Abdominal pain, acute. Nausea vomiting and diarrhea all day. Dizziness. EXAM: CT ABDOMEN AND PELVIS WITH CONTRAST TECHNIQUE: Multidetector CT imaging of the abdomen and pelvis was performed using the standard protocol following bolus administration of intravenous contrast. RADIATION DOSE REDUCTION: This exam was performed according to the departmental dose-optimization program which includes automated exposure control, adjustment of the mA and/or kV according to patient size and/or use of iterative reconstruction technique. CONTRAST:  151m OMNIPAQUE IOHEXOL 350 MG/ML SOLN COMPARISON:  None Available. FINDINGS: Lower chest: Prominent coronary artery atherosclerotic calcifications. Bibasilar dependent atelectasis. Hepatobiliary: No focal liver abnormality is seen. Rim calcified gallstone measuring up to 2 cm. No gallbladder wall thickening, or biliary dilatation. Pancreas:  Moderate atrophy of the pancreas. No pancreatic ductal dilatation or surrounding inflammatory changes. Spleen: Normal in size without focal abnormality. Adrenals/Urinary Tract: Adrenal glands are unremarkable. Kidneys are normal, without renal calculi, focal lesion, or hydronephrosis. Bladder is unremarkable. Stomach/Bowel: Stomach is within normal limits. Appendix appears normal. There is marked thickening of the descending and sigmoid colonic wall with adjacent fat stranding concerning for severe colitis (axial images 40-60; coronal images 45-56). No pneumatosis or extraluminal free air.);. Vascular/Lymphatic: Moderate aortic atherosclerosis. No enlarged abdominal or pelvic lymph nodes. Reproductive: Vaginal pessary in place.  Uterus is unremarkable. Other: No abdominal wall hernia or abnormality. No abdominopelvic ascites. Musculoskeletal: Moderate levoscoliosis of the lumbar spine centered at L2 vertebral body. 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 Multilevel facet joint arthropathy. Status post left hip arthroplasty with intact hardware. No acute osseous abnormality. IMPRESSION: 1. Marked thickening of the descending and sigmoid colonic wall with adjacent fat stranding concerning for severe colitis. 2.  Cholelithiasis without evidence of acute cholecystitis. 3.  Moderate pancreatic atrophy. 4. Moderate-to-severe atherosclerotic disease of abdominal aorta and branch vessels. 5. 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 Status post left hip arthroplasty with intact  hardware. No acute osseous abnormality. 6.  Additional chronic findings as above. Electronically Signed   By: Keane Police D.O.   On: 11/22/2021 23:29   CT Angio Chest PE W and/or Wo Contrast  Result Date: 11/22/2021 CLINICAL DATA:  Nausea and vomiting all day; became dizzy and weak EXAM: CT ANGIOGRAPHY CHEST WITH  CONTRAST TECHNIQUE: Multidetector CT imaging of the chest was performed using the standard protocol during bolus administration of intravenous contrast. Multiplanar CT image reconstructions and MIPs were obtained to evaluate the vascular anatomy. RADIATION DOSE REDUCTION: This exam was performed according to the departmental dose-optimization program which includes automated exposure control, adjustment of the mA and/or kV according to patient size and/or use of iterative reconstruction technique. CONTRAST:  151m OMNIPAQUE IOHEXOL 350 MG/ML SOLN COMPARISON:  Radiographs earlier today and CT chest 06/05/2016 FINDINGS: Cardiovascular: Satisfactory opacification of the pulmonary arteries to the segmental level. No evidence of pulmonary embolism. Enlarged main pulmonary cardiomegaly. No pericardial effusion. Three-vessel coronary artery atherosclerosis. Mitral annular calcification. Aortic valve calcification. Aortic arch calcification. Mediastinum/Nodes: No thoracic adenopathy by size. Small left thyroid nodule not requiring follow-up. Unremarkable esophagus. Lungs/Pleura: Exam performed during expiration this may account for some of the interlobular septal thickening and lower lobe predominant atelectasis. Diffuse interlobular septal thickening. Bronchial wall thickening and mucous plugging greatest in the lower lobes. 12.0 x 10.0 cm nodule in the posterior right upper lobe (series 9/image 49). Upper Abdomen: No acute abnormality. Musculoskeletal: No chest wall abnormality. No acute osseous findings. Review of the MIP images confirms the above findings. IMPRESSION: 1. Negative for acute pulmonary embolism. 2. Bronchial wall thickening and interlobular septal thickening may be due to edema or infection. 3. Bronchial inflammation and mucous plugging. 4. Right upper lobe solid pulmonary nodule measuring 11 mm (average diameter). Consider one of the following in 3 months for both low-risk and high-risk individuals: (a)  repeat chest CT, (b) follow-up PET-CT, or (c) tissue sampling. This recommendation follows the consensus statement: Guidelines for Management of Incidental Pulmonary Nodules Detected on CT Images: From the Fleischner Society 2017; Radiology 2017; 284:228-243. 5. Three-vessel coronary atherosclerosis. 6.  Aortic Atherosclerosis (ICD10-I70.0). Electronically Signed   By: TPlacido SouM.D.   On: 11/22/2021 23:29   DG Chest Portable 1 View  Result Date: 11/22/2021 CLINICAL DATA:  Nausea vomiting hypoxia EXAM: PORTABLE CHEST 1 VIEW COMPARISON:  Chest x-ray 06/10/2016 FINDINGS: Cardiomegaly. Mild bronchitic changes. No acute consolidation, pleural effusion or pneumothorax. Dextrocurvature of the spine IMPRESSION: Cardiomegaly without acute focal airspace disease Electronically Signed   By: KDonavan FoilM.D.   On: 11/22/2021 21:04    Microbiology: Results for orders placed or performed during the hospital encounter of 11/22/21  Resp Panel by RT-PCR (Flu A&B, Covid) Anterior Nasal Swab     Status: None   Collection Time: 11/22/21  8:55 PM   Specimen: Anterior Nasal Swab  Result Value Ref Range Status   SARS Coronavirus 2 by RT PCR NEGATIVE NEGATIVE Final    Comment: (NOTE) SARS-CoV-2 target nucleic acids are NOT DETECTED.  The SARS-CoV-2 RNA is generally detectable in upper respiratory specimens during the acute phase of infection. The lowest concentration of SARS-CoV-2 viral copies this assay can detect is 138 copies/mL. A negative result does not preclude SARS-Cov-2 infection and should not be used as the sole basis for treatment or other patient management decisions. A negative result may occur with  improper specimen collection/handling, submission of specimen other than nasopharyngeal swab, presence of viral mutation(s) within the areas targeted  by this assay, and inadequate number of viral copies(<138 copies/mL). A negative result must be combined with clinical observations, patient  history, and epidemiological information. The expected result is Negative.  Fact Sheet for Patients:  EntrepreneurPulse.com.au  Fact Sheet for Healthcare Providers:  IncredibleEmployment.be  This test is no t yet approved or cleared by the [place] FDA and  has been authorized for detection and/or diagnosis of SARS-CoV-2 by FDA under an Emergency Use Authorization (EUA). This EUA will remain  in effect (meaning this test can be used) for the duration of the COVID-19 declaration under Section 564(b)(1) of the Act, 21 U.S.C.section 360bbb-3(b)(1), unless the authorization is terminated  or revoked sooner.       Influenza A by PCR NEGATIVE NEGATIVE Final   Influenza B by PCR NEGATIVE NEGATIVE Final    Comment: (NOTE) The Xpert Xpress SARS-CoV-2/FLU/RSV plus assay is intended as an aid in the diagnosis of influenza from Nasopharyngeal swab specimens and should not be used as a sole basis for treatment. Nasal washings and aspirates are unacceptable for Xpert Xpress SARS-CoV-2/FLU/RSV testing.  Fact Sheet for Patients: EntrepreneurPulse.com.au  Fact Sheet for Healthcare Providers: IncredibleEmployment.be  This test is not yet approved or cleared by the [place] FDA and has been authorized for detection and/or diagnosis of SARS-CoV-2 by FDA under an Emergency Use Authorization (EUA). This EUA will remain in effect (meaning this test can be used) for the duration of the COVID-19 declaration under Section 564(b)(1) of the Act, 21 U.S.C. section 360bbb-3(b)(1), unless the authorization is terminated or revoked.  Performed at [facility], [address], [place], [place] [address]   Blood culture (routine x 2)     Status: None (Preliminary result)   Collection Time: 11/22/21  9:10 PM   Specimen: BLOOD RIGHT FOREARM  Result Value Ref Range Status   Specimen Description   Final    BLOOD RIGHT FOREARM  BOTTLES DRAWN AEROBIC AND ANAEROBIC   Special Requests Blood Culture adequate volume  Final   Culture   Final    NO GROWTH 3 DAYS Performed at [facility], [address], [place], [place] [address]    Report Status PENDING  Incomplete  Blood culture (routine x 2)     Status: None (Preliminary result)   Collection Time: 11/22/21  9:22 PM   Specimen: Site Not Specified; Blood  Result Value Ref Range Status   Specimen Description   Final    SITE NOT SPECIFIED BOTTLES DRAWN AEROBIC AND ANAEROBIC   Special Requests Blood Culture adequate volume  Final   Culture   Final    NO GROWTH 3 DAYS Performed at [facility], [address] [place], [place] [address]    Report Status PENDING  Incomplete  MRSA Next Gen by PCR, Nasal     Status: None   Collection Time: 11/23/21  5:11 AM   Specimen: Nasal Mucosa; Nasal Swab  Result Value Ref Range Status   MRSA by PCR Next Gen NOT DETECTED NOT DETECTED Final    Comment: (NOTE) The GeneXpert MRSA Assay (FDA approved for NASAL specimens only), is one component of a comprehensive MRSA colonization surveillance program. It is not intended to diagnose MRSA infection nor to guide or monitor treatment for MRSA infections. Test performance is not FDA approved in patients less than 57 years old. Performed at [facility], [address] [place],  [address]     Labs: CBC: Recent Labs  Lab 11/22/21 2032 11/22/21 2041 11/23/21 6195 11/24/21 0932  WBC 15.3*  --  13.8* 11.9*  NEUTROABS  --   --  11.5*  --   HGB 11.9* 12.6 11.7* 10.3*  HCT 36.7 37.0 36.3 31.5*  MCV 92.0  --  92.8 92.1  PLT 186  --  187 136   Basic Metabolic Panel: Recent Labs  Lab 11/22/21 2032 11/22/21 2041 11/23/21 0554 11/24/21 0426  NA 136 135 138 141  K 4.8 4.4 4.2 4.1  CL 100 98 105 106  CO2 27  --  28 28  GLUCOSE 123* 120* 80 90  BUN _0 CREATININE 1.32* 1.30* 0.87 0.72  CALCIUM 8.1*  --  8.2* 7.9*  MG  --   --  2.2  --    Liver Function  Tests: Recent Labs  Lab 11/22/21 2032 11/23/21 0554  AST 23 15  ALT 10 11  ALKPHOS 69 65  BILITOT 1.2 0.9  PROT 6.2* 6.3*  ALBUMIN 3.6 3.5   CBG: Recent Labs  Lab 11/22/21 2036  GLUCAP 112*    Discharge time spent: greater than 30 minutes.  Signed: Barton Dubois, MD [facility] 11/25/2021

## 2021-11-25 NOTE — Care Management Important Message (Signed)
Important Message  Patient Details  Name: Barbara Lucero MRN: [id number] Date of Birth: February 10, 1941   Medicare Important Message Given:  Yes     Tommy Medal 11/25/2021, 12:14 PM

## 2021-11-25 NOTE — Telephone Encounter (Signed)
Patient of Dr. Olevia Perches many years ago. Needs to reestablish with GI from Chron's disease. Please scheduled hospital follow up in 3-4 weeks with any APP or Dr. Abbey Chatters.   Venetia Night, MSN, APRN, FNP-BC, AGACNP-BC [facility]

## 2021-11-25 NOTE — Assessment & Plan Note (Signed)
-  Resolved -Safe to resume home antihypertensive agents -Patient advised to keep herself well-hydrated.

## 2021-11-25 NOTE — TOC Transition Note (Signed)
Transition of Care [facility]) - CM/SW Discharge Note   Patient Details  Name: Barbara Lucero MRN: [id number] Date of Birth: 02-Mar-1940  Transition of Care [facility]) CM/SW Contact:  Elliot Gault, LCSW Phone Number: 11/25/2021, 10:59 AM   Clinical Narrative:     Pt stable for dc home today per MD. PT recommending HHPT. Discussed dc plan with pt who is agreeable to HHPT. She requests [facility] stating she has had them in the past. Referral made to [facility] at [facility] and pt is accepted. Pt states she does not have any other TOC needs for dc.  Expected Discharge Plan: Home w Home Health Services Barriers to Discharge: Barriers Resolved   Patient Goals and CMS Choice Patient states their goals for this hospitalization and ongoing recovery are:: go home CMS Medicare.gov Compare Post Acute Care list provided to:: Patient Choice offered to / list presented to : Patient  Expected Discharge Plan and Services Expected Discharge Plan: Home w Home Health Services In-house Referral: Clinical Social Work   Post Acute Care Choice: Home Health Living arrangements for the past 2 months: Single Family Home Expected Discharge Date: 11/25/21                         HH Arranged: PT HH Agency: [facility] Date HH Agency Contacted: 11/25/21   Representative spoke with at [facility] Agency: Bonita Quin  Prior Living Arrangements/Services Living arrangements for the past 2 months: Single Family Home Lives with:: Self Patient language and need for interpreter reviewed:: Yes Do you feel safe going back to the place where you live?: Yes      Need for Family Participation in Patient Care: No (Comment)     Criminal Activity/Legal Involvement Pertinent to Current Situation/Hospitalization: No - Comment as needed  Activities of Daily Living Home Assistive Devices/Equipment: Other (Comment) (cane) ADL Screening (condition at time of admission) Patient's cognitive ability adequate to safely complete daily  activities?: Yes Is the patient deaf or have difficulty hearing?: No Does the patient have difficulty seeing, even when wearing glasses/contacts?: No Does the patient have difficulty concentrating, remembering, or making decisions?: No Patient able to express need for assistance with ADLs?: Yes Does the patient have difficulty dressing or bathing?: No Independently performs ADLs?: Yes (appropriate for developmental age) Does the patient have difficulty walking or climbing stairs?: No Weakness of Legs: Both Weakness of Arms/Hands: None  Permission Sought/Granted Permission sought to share information with : Facility Industrial/product designer granted to share information with : Yes, Verbal Permission Granted     Permission granted to share info w AGENCY: [facility]        Emotional Assessment   Attitude/Demeanor/Rapport: Engaged Affect (typically observed): Pleasant Orientation: : Oriented to Self, Oriented to Place, Oriented to  Time, Oriented to Situation Alcohol / Substance Use: Not Applicable Psych Involvement: No (comment)  Admission diagnosis:  Acute respiratory failure with hypoxia (HCC) [J96.01] Sepsis (HCC) [A41.9] Severe sepsis (HCC) [A41.9, R65.20] Noninfectious gastroenteritis, unspecified type [K52.9] Hypotension due to hypovolemia [I95.89, E86.1] Patient Active Problem List   Diagnosis Date Noted   Hypotension due to hypovolemia    Left lower quadrant abdominal pain    Sepsis (HCC) 11/23/2021   Noninfectious gastroenteritis 11/23/2021   AKI (acute kidney injury) (HCC) 11/23/2021   GERD (gastroesophageal reflux disease) 11/23/2021   Diarrhea    S/P hip replacement, 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 06/01/2017   Anxiety state 06/08/2016   Anxiety and depression 06/08/2016  Crohn's disease ([place]) 06/08/2016   History of fracture of left hip 06/05/2016   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   Foot fracture 12/15/2013   Metatarsal fracture 11/05/2013   PCP:   Celene Squibb, MD Pharmacy:   [place], [address] [place] [address] Phone: [phone] Fax: [phone]     Social Determinants of Health (SDOH) Interventions    Readmission Risk Interventions     No data to display           Final next level of care: [place] Barriers to Discharge: Barriers Resolved   Patient Goals and CMS Choice Patient states their goals for this hospitalization and ongoing recovery are:: go home CMS Medicare.gov Compare Post Acute Care list provided to:: Patient Choice offered to / list presented to : Patient  Discharge Placement                       Discharge Plan and Services In-house Referral: Clinical Social Work   Post Acute Care Choice: [place]: PT [facility] Agency: [place] ([place]) Date [place] [facility]: 11/25/21   Representative spoke with at [place]: [place] ([place]) Interventions     Readmission Risk Interventions     No data to display

## 2021-11-26 DIAGNOSIS — A419 Sepsis, unspecified organism: Secondary | ICD-10-CM | POA: Diagnosis not present

## 2021-11-26 DIAGNOSIS — M1990 Unspecified osteoarthritis, unspecified site: Secondary | ICD-10-CM | POA: Diagnosis not present

## 2021-11-26 DIAGNOSIS — Z9181 History of falling: Secondary | ICD-10-CM | POA: Diagnosis not present

## 2021-11-26 DIAGNOSIS — K219 Gastro-esophageal reflux disease without esophagitis: Secondary | ICD-10-CM | POA: Diagnosis not present

## 2021-11-26 DIAGNOSIS — F32A Depression, unspecified: Secondary | ICD-10-CM | POA: Diagnosis not present

## 2021-11-26 DIAGNOSIS — E785 Hyperlipidemia, unspecified: Secondary | ICD-10-CM | POA: Diagnosis not present

## 2021-11-26 DIAGNOSIS — I83893 Varicose veins of bilateral lower extremities with other complications: Secondary | ICD-10-CM | POA: Diagnosis not present

## 2021-11-26 DIAGNOSIS — N179 Acute kidney failure, unspecified: Secondary | ICD-10-CM | POA: Diagnosis not present

## 2021-11-26 DIAGNOSIS — K50918 Crohn's disease, unspecified, with other complication: Secondary | ICD-10-CM | POA: Diagnosis not present

## 2021-11-27 LAB — CULTURE, BLOOD (ROUTINE X 2)
Culture: NO GROWTH
Culture: NO GROWTH
Special Requests: ADEQUATE
Special Requests: ADEQUATE

## 2021-11-28 DIAGNOSIS — E785 Hyperlipidemia, unspecified: Secondary | ICD-10-CM | POA: Diagnosis not present

## 2021-11-28 DIAGNOSIS — K219 Gastro-esophageal reflux disease without esophagitis: Secondary | ICD-10-CM | POA: Diagnosis not present

## 2021-11-28 DIAGNOSIS — Z9181 History of falling: Secondary | ICD-10-CM | POA: Diagnosis not present

## 2021-11-28 DIAGNOSIS — K50918 Crohn's disease, unspecified, with other complication: Secondary | ICD-10-CM | POA: Diagnosis not present

## 2021-11-28 DIAGNOSIS — I83893 Varicose veins of bilateral lower extremities with other complications: Secondary | ICD-10-CM | POA: Diagnosis not present

## 2021-11-28 DIAGNOSIS — N179 Acute kidney failure, unspecified: Secondary | ICD-10-CM | POA: Diagnosis not present

## 2021-11-28 DIAGNOSIS — M1990 Unspecified osteoarthritis, unspecified site: Secondary | ICD-10-CM | POA: Diagnosis not present

## 2021-11-28 DIAGNOSIS — A419 Sepsis, unspecified organism: Secondary | ICD-10-CM | POA: Diagnosis not present

## 2021-11-28 DIAGNOSIS — F32A Depression, unspecified: Secondary | ICD-10-CM | POA: Diagnosis not present

## 2021-11-30 DIAGNOSIS — K529 Noninfective gastroenteritis and colitis, unspecified: Secondary | ICD-10-CM | POA: Diagnosis not present

## 2021-11-30 DIAGNOSIS — A419 Sepsis, unspecified organism: Secondary | ICD-10-CM | POA: Diagnosis not present

## 2021-11-30 DIAGNOSIS — R197 Diarrhea, unspecified: Secondary | ICD-10-CM | POA: Diagnosis not present

## 2021-11-30 DIAGNOSIS — Z5320 Procedure and treatment not carried out because of patient's decision for unspecified reasons: Secondary | ICD-10-CM | POA: Diagnosis not present

## 2021-11-30 DIAGNOSIS — N179 Acute kidney failure, unspecified: Secondary | ICD-10-CM | POA: Diagnosis not present

## 2021-11-30 DIAGNOSIS — K5080 Crohn's disease of both small and large intestine without complications: Secondary | ICD-10-CM | POA: Diagnosis not present

## 2021-11-30 DIAGNOSIS — I959 Hypotension, unspecified: Secondary | ICD-10-CM | POA: Diagnosis not present

## 2021-12-02 DIAGNOSIS — Z9181 History of falling: Secondary | ICD-10-CM | POA: Diagnosis not present

## 2021-12-02 DIAGNOSIS — F32A Depression, unspecified: Secondary | ICD-10-CM | POA: Diagnosis not present

## 2021-12-02 DIAGNOSIS — E785 Hyperlipidemia, unspecified: Secondary | ICD-10-CM | POA: Diagnosis not present

## 2021-12-02 DIAGNOSIS — I83893 Varicose veins of bilateral lower extremities with other complications: Secondary | ICD-10-CM | POA: Diagnosis not present

## 2021-12-02 DIAGNOSIS — K50918 Crohn's disease, unspecified, with other complication: Secondary | ICD-10-CM | POA: Diagnosis not present

## 2021-12-02 DIAGNOSIS — M1990 Unspecified osteoarthritis, unspecified site: Secondary | ICD-10-CM | POA: Diagnosis not present

## 2021-12-02 DIAGNOSIS — K219 Gastro-esophageal reflux disease without esophagitis: Secondary | ICD-10-CM | POA: Diagnosis not present

## 2021-12-02 DIAGNOSIS — R197 Diarrhea, unspecified: Secondary | ICD-10-CM | POA: Diagnosis not present

## 2021-12-02 DIAGNOSIS — A419 Sepsis, unspecified organism: Secondary | ICD-10-CM | POA: Diagnosis not present

## 2021-12-02 DIAGNOSIS — N179 Acute kidney failure, unspecified: Secondary | ICD-10-CM | POA: Diagnosis not present

## 2021-12-04 DIAGNOSIS — K219 Gastro-esophageal reflux disease without esophagitis: Secondary | ICD-10-CM | POA: Diagnosis not present

## 2021-12-04 DIAGNOSIS — F32A Depression, unspecified: Secondary | ICD-10-CM | POA: Diagnosis not present

## 2021-12-04 DIAGNOSIS — M1990 Unspecified osteoarthritis, unspecified site: Secondary | ICD-10-CM | POA: Diagnosis not present

## 2021-12-04 DIAGNOSIS — E785 Hyperlipidemia, unspecified: Secondary | ICD-10-CM | POA: Diagnosis not present

## 2021-12-04 DIAGNOSIS — K50918 Crohn's disease, unspecified, with other complication: Secondary | ICD-10-CM | POA: Diagnosis not present

## 2021-12-04 DIAGNOSIS — I83893 Varicose veins of bilateral lower extremities with other complications: Secondary | ICD-10-CM | POA: Diagnosis not present

## 2021-12-04 DIAGNOSIS — A419 Sepsis, unspecified organism: Secondary | ICD-10-CM | POA: Diagnosis not present

## 2021-12-04 DIAGNOSIS — N179 Acute kidney failure, unspecified: Secondary | ICD-10-CM | POA: Diagnosis not present

## 2021-12-04 DIAGNOSIS — Z9181 History of falling: Secondary | ICD-10-CM | POA: Diagnosis not present

## 2021-12-09 DIAGNOSIS — R197 Diarrhea, unspecified: Secondary | ICD-10-CM | POA: Diagnosis not present

## 2021-12-17 DIAGNOSIS — K219 Gastro-esophageal reflux disease without esophagitis: Secondary | ICD-10-CM | POA: Diagnosis not present

## 2021-12-17 DIAGNOSIS — E785 Hyperlipidemia, unspecified: Secondary | ICD-10-CM | POA: Diagnosis not present

## 2021-12-17 DIAGNOSIS — N179 Acute kidney failure, unspecified: Secondary | ICD-10-CM | POA: Diagnosis not present

## 2021-12-17 DIAGNOSIS — I83893 Varicose veins of bilateral lower extremities with other complications: Secondary | ICD-10-CM | POA: Diagnosis not present

## 2021-12-17 DIAGNOSIS — M1990 Unspecified osteoarthritis, unspecified site: Secondary | ICD-10-CM | POA: Diagnosis not present

## 2021-12-17 DIAGNOSIS — Z9181 History of falling: Secondary | ICD-10-CM | POA: Diagnosis not present

## 2021-12-17 DIAGNOSIS — K50918 Crohn's disease, unspecified, with other complication: Secondary | ICD-10-CM | POA: Diagnosis not present

## 2021-12-17 DIAGNOSIS — F32A Depression, unspecified: Secondary | ICD-10-CM | POA: Diagnosis not present

## 2021-12-17 DIAGNOSIS — A419 Sepsis, unspecified organism: Secondary | ICD-10-CM | POA: Diagnosis not present

## 2021-12-20 NOTE — Progress Notes (Incomplete)
CARDIOLOGY CONSULT NOTE       Patient ID: PAMULA KLEEN MRN: [id number] DOB/AGE: March 25, 1940 81 y.o.  Admit date: (Not on file) Referring Physician: Luan Pulling Primary Physician: Celene Squibb, MD Primary Cardiologist: Johnsie Cancel   HPI:  81 y.o. with history of Anxiety, depression Crohn disease and arthritis Referred by Dr Luan Pulling for irregular pulse and murmur on 08/09/19  She sees Dr Trula Slade for DVT f/u. Unprovoked in left leg June 2019 Appears to have been left on eliquis since that time  However appears that DVT occurred after hip surgery by history Lost her husband 2021 and depressed Previously on Doxepin Apparently an insurance nurse came to her house and told her she had a heart murmur Quit smoking 20 years ago  She has GI issues with Chohn's on sulfasalazine and reflux on protonix  She has had a hard life. She has a 79 yo son with CP who is bed bound now with supra pubic catheter she cares For by herself. 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 She understands that she will likely need TAVR evaluation within the next year or two at most. Symptoms of dyspnea, chest pain, syncope discussed The process of TAVR evaluation including cath , CTA, and surgical consultation also discussed   Hospitalized 11/13/21 with severe colitis and hypotension she had been taking Motrin for 2 weeks for her arthritis She also only taking Sulfasalazine as needed Seen by GI Dr Abbey Chatters in hospital D/c with Sulfasalazine and steroid taper has not had outpatient GI  f/u or colonoscopy yet  ***    ROS All other systems reviewed and negative except as noted above  Past Medical History:  Diagnosis Date  . Anxiety   . Arthritis   . Crohn disease ([place])   . Depression   . Varicose veins of bilateral lower extremities with other complications     Family History  Problem Relation Age of Onset  . Breast cancer Sister   . Breast cancer Sister   . Other Son        disabled  . Bipolar disorder Daughter     Social History   Socioeconomic History  . Marital status: Widowed    Spouse name: Not on file  . Number of children: Not on file  . Years of education: Not on file  . Highest education level: Not on file  Occupational History  . Not on file  Tobacco Use  . Smoking status: Never  . Smokeless tobacco: Never  Vaping Use  . Vaping Use: Never used  Substance and Sexual Activity  . Alcohol use: No  . Drug use: No  . Sexual activity: Not Currently    Birth control/protection: Post-menopausal  Other Topics Concern  . Not on file  Social History Narrative  . Not on file   Social Determinants of Health   Financial Resource Strain: Not on file  Food Insecurity: No Food Insecurity (11/23/2021)   Hunger Vital Sign   .  Worried About Charity fundraiser in the Last Year: Never true   . Ran Out of Food in the Last Year: Never true  Transportation Needs: No Transportation Needs (11/23/2021)   PRAPARE - Transportation   . Lack of Transportation (Medical): No   . Lack of Transportation (Non-Medical): No  Physical Activity: Not on file  Stress: Not on file  Social Connections: Not on file  Intimate Partner Violence: Not At Risk (11/23/2021)   Humiliation, Afraid, Rape, and Kick questionnaire   . Fear of Current or Ex-Partner: No   . Emotionally Abused: No   . Physically Abused: No   . Sexually Abused: No    Past Surgical History:  Procedure Laterality Date  . BOWEL RESECTION    . HIP ARTHROPLASTY Left 06/06/2016   Procedure: ARTHROPLASTY  BIPOLAR HIP (HEMIARTHROPLASTY);  Surgeon: Carole Civil, MD;  Location: [facility];  Service: Orthopedics;  Laterality: Left;  . lt hip replacement        Current Outpatient Medications:  .  acetaminophen (TYLENOL) 650 MG CR tablet, Take 650 mg by mouth every 8 (eight) hours as needed for pain., Disp: , Rfl:  .  Ascorbic Acid (VITAMIN C) 1000 MG tablet, Take 1,000 mg by mouth. , Disp: , Rfl:  .  b complex vitamins tablet, Take 1 tablet by mouth daily., Disp: , Rfl:  .  CALCIUM PO, Take 1 tablet by mouth daily., Disp: , Rfl:  .  COLLAGEN PO, Take 1 tablet by mouth daily., Disp: , Rfl:  .  furosemide (LASIX) 20 MG tablet, Take 20 mg by mouth daily., Disp: , Rfl:  .  gabapentin (NEURONTIN) 100 MG capsule, Take 1 capsule (100 mg total) by mouth 3 (three) times daily., Disp: 90 capsule, Rfl: 1 .  MYRBETRIQ 50 MG TB24 tablet, TAKE (1) TABLET BY MOUTH AT BEDTIME. (Patient taking differently: Take 50 mg by mouth at bedtime.), Disp: 30 tablet, Rfl: 11 .  olmesartan (BENICAR) 40 MG tablet, Take 40 mg by mouth daily., Disp: , Rfl:  .  pantoprazole (PROTONIX) 40 MG tablet, Take 40 mg by mouth daily., Disp: , Rfl:  .  predniSONE (DELTASONE) 10 MG tablet, Take 40 mg by mouth daily x1 week; then 30 mg by mouth daily x1 week; then 20 mg by mouth daily x1 week; then 10 mg by mouth daily x1 week and stop prednisone., Disp: 70 tablet, Rfl: 0 .  Psyllium (METAMUCIL PO), Take 1 Package by mouth daily as needed (constipation)., Disp: , Rfl:  .  sertraline (ZOLOFT) 100 MG tablet, Take 100 mg by mouth daily., Disp: , Rfl:  .  simvastatin (ZOCOR) 10 MG tablet, Take 10 mg by mouth at bedtime., Disp: , Rfl:  .  solifenacin (VESICARE) 10 MG tablet, TAKE (1) TABLET BY MOUTH AT BEDTIME. (Patient taking differently: Take 10 mg by mouth at bedtime.), Disp: 30 tablet, Rfl: 11 .  sulfaSALAzine (AZULFIDINE) 500 MG tablet, Take 1 tablet (500 mg total) by mouth 2 (two) times daily., Disp: 60 tablet, Rfl: 2 .  vitamin E 400 UNIT  capsule, Take 400 Units by mouth daily., Disp: , Rfl:     Physical Exam: There were no vitals taken for this visit.   Affect appropriate Walks with walker  Obese white female  HEENT: normal Neck supple with no adenopathy JVP normal bilateral bruits vs transmitted murmur  no thyromegaly Lungs clear with no wheezing and good diaphragmatic motion Heart:  S1/S2 diminished AS murmur no rub, gallop or click  PMI normal Abdomen: benighn, BS positve, no tenderness, no AAA no bruit.  No HSM or HJR Distal pulses intact with no bruits Bilateral edema L>R  Neuro non-focal Skin warm and dry No muscular weakness   Labs:   Lab Results  Component Value Date   WBC 11.9 (H) 11/24/2021   HGB 10.3 (L) 11/24/2021   HCT 31.5 (L) 11/24/2021   MCV 92.1 11/24/2021   PLT 155 11/24/2021   No results for input(s): "NA", "K", "CL", "CO2", "BUN", "CREATININE", "CALCIUM", "PROT", "BILITOT", "ALKPHOS", "ALT", "AST", "GLUCOSE" in the last 168 hours.  Invalid input(s): "LABALBU" No results found for: "CKTOTAL", "CKMB", "CKMBINDEX", "TROPONINI" No results found for: "CHOL" No results found for: "HDL" No results found for: "LDLCALC" No results found for: "TRIG" No results found for: "CHOLHDL" No results found for: "LDLDIRECT"    Radiology: CT ABDOMEN PELVIS W CONTRAST  Result Date: 11/22/2021 CLINICAL DATA:  Abdominal pain, acute. Nausea vomiting and diarrhea all day. Dizziness. EXAM: CT ABDOMEN AND PELVIS WITH CONTRAST TECHNIQUE: Multidetector CT imaging of the abdomen and pelvis was performed using the standard protocol following bolus administration of intravenous contrast. RADIATION DOSE REDUCTION: This exam was performed according to the departmental dose-optimization program which includes automated exposure control, adjustment of the mA and/or kV according to patient size and/or use of iterative reconstruction technique. CONTRAST:  165mL OMNIPAQUE IOHEXOL 350 MG/ML SOLN COMPARISON:  None Available.  FINDINGS: Lower chest: Prominent coronary artery atherosclerotic calcifications. Bibasilar dependent atelectasis. Hepatobiliary: No focal liver abnormality is seen. Rim calcified gallstone measuring up to 2 cm. No gallbladder wall thickening, or biliary dilatation. Pancreas: Moderate atrophy of the pancreas. No pancreatic ductal dilatation or surrounding inflammatory changes. Spleen: Normal in size without focal abnormality. Adrenals/Urinary Tract: Adrenal glands are unremarkable. Kidneys are normal, without renal calculi, focal lesion, or hydronephrosis. Bladder is unremarkable. Stomach/Bowel: Stomach is within normal limits. Appendix appears normal. There is marked thickening of the descending and sigmoid colonic wall with adjacent fat stranding concerning for severe colitis (axial images 40-60; coronal images 45-56). No pneumatosis or extraluminal free air.);. Vascular/Lymphatic: Moderate aortic atherosclerosis. No enlarged abdominal or pelvic lymph nodes. Reproductive: Vaginal pessary in place.  Uterus is unremarkable. Other: No abdominal wall hernia or abnormality. No abdominopelvic ascites. Musculoskeletal: Moderate levoscoliosis of the lumbar spine centered at L2 vertebral body. 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 Multilevel facet joint arthropathy. Status post left hip arthroplasty with intact hardware. No acute osseous abnormality. IMPRESSION: 1. Marked thickening of the descending and sigmoid colonic wall with adjacent fat stranding concerning for severe colitis. 2.  Cholelithiasis without evidence of acute cholecystitis. 3.  Moderate pancreatic atrophy. 4. Moderate-to-severe atherosclerotic disease of abdominal aorta and branch vessels. 5. 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 Status post left hip arthroplasty with intact hardware. No acute osseous abnormality. 6.   Additional chronic findings as above. Electronically Signed   By: Keane Police D.O.   On: 11/22/2021 23:29   CT Angio Chest PE W and/or Wo Contrast  Result Date: 11/22/2021 CLINICAL DATA:  Nausea and vomiting all day; became dizzy and weak EXAM: CT ANGIOGRAPHY CHEST WITH CONTRAST TECHNIQUE: Multidetector CT imaging of the chest was performed using the standard protocol during bolus administration of intravenous contrast. Multiplanar CT image reconstructions and MIPs were obtained to evaluate the vascular anatomy. RADIATION DOSE REDUCTION: This exam was performed according to the departmental dose-optimization program which includes  automated exposure control, adjustment of the mA and/or kV according to patient size and/or use of iterative reconstruction technique. CONTRAST:  164mL OMNIPAQUE IOHEXOL 350 MG/ML SOLN COMPARISON:  Radiographs earlier today and CT chest 06/05/2016 FINDINGS: Cardiovascular: Satisfactory opacification of the pulmonary arteries to the segmental level. No evidence of pulmonary embolism. Enlarged main pulmonary cardiomegaly. No pericardial effusion. Three-vessel coronary artery atherosclerosis. Mitral annular calcification. Aortic valve calcification. Aortic arch calcification. Mediastinum/Nodes: No thoracic adenopathy by size. Small left thyroid nodule not requiring follow-up. Unremarkable esophagus. Lungs/Pleura: Exam performed during expiration this may account for some of the interlobular septal thickening and lower lobe predominant atelectasis. Diffuse interlobular septal thickening. Bronchial wall thickening and mucous plugging greatest in the lower lobes. 12.0 x 10.0 cm nodule in the posterior right upper lobe (series 9/image 49). Upper Abdomen: No acute abnormality. Musculoskeletal: No chest wall abnormality. No acute osseous findings. Review of the MIP images confirms the above findings. IMPRESSION: 1. Negative for acute pulmonary embolism. 2. Bronchial wall thickening and  interlobular septal thickening may be due to edema or infection. 3. Bronchial inflammation and mucous plugging. 4. Right upper lobe solid pulmonary nodule measuring 11 mm (average diameter). Consider one of the following in 3 months for both low-risk and high-risk individuals: (a) repeat chest CT, (b) follow-up PET-CT, or (c) tissue sampling. This recommendation follows the consensus statement: Guidelines for Management of Incidental Pulmonary Nodules Detected on CT Images: From the Fleischner Society 2017; Radiology 2017; 284:228-243. 5. Three-vessel coronary atherosclerosis. 6.  Aortic Atherosclerosis (ICD10-I70.0). Electronically Signed   By: Placido Sou M.D.   On: 11/22/2021 23:29   DG Chest Portable 1 View  Result Date: 11/22/2021 CLINICAL DATA:  Nausea vomiting hypoxia EXAM: PORTABLE CHEST 1 VIEW COMPARISON:  Chest x-ray 06/10/2016 FINDINGS: Cardiomegaly. Mild bronchitic changes. No acute consolidation, pleural effusion or pneumothorax. Dextrocurvature of the spine IMPRESSION: Cardiomegaly without acute focal airspace disease Electronically Signed   By: Donavan Foil M.D.   On: 11/22/2021 21:04     EKG: NSR normal ECG rate 64   ASSESSMENT AND PLAN:   1. DVT:  History unprovoked per Dr Trula Slade left on lifel long eliquis f/u VVS 2  AS:  progression to severe level see above Discussed issues regarding Rx with either TAVR/SAVR  Asymptomatic favors observation with echo in 6 months See discussion above  3. Bruit :  Vs transmitted murmur duplex 2021 plaque no stenosis  4. Crohns:  F/u GI hospitalized 11/2021 with sepsis Steroid taper and Sulfasalazine   TTE AS 6 months   F/U 6 months   Time:  spent complicated medical decision making natural history of AS, discussing SAVR/TAVR exam and composing note as well as reviewing echos 60 minutes   Signed: Jenkins Rouge 12/20/2021, 2:34 PM

## 2021-12-27 ENCOUNTER — Ambulatory Visit (HOSPITAL_COMMUNITY): Payer: Medicare Other

## 2021-12-30 ENCOUNTER — Ambulatory Visit: Payer: Medicare Other | Admitting: Cardiovascular Disease

## 2022-01-01 DIAGNOSIS — R0981 Nasal congestion: Secondary | ICD-10-CM | POA: Diagnosis not present

## 2022-01-01 DIAGNOSIS — J069 Acute upper respiratory infection, unspecified: Secondary | ICD-10-CM | POA: Diagnosis not present

## 2022-01-01 DIAGNOSIS — R059 Cough, unspecified: Secondary | ICD-10-CM | POA: Diagnosis not present

## 2022-01-01 DIAGNOSIS — R52 Pain, unspecified: Secondary | ICD-10-CM | POA: Diagnosis not present

## 2022-01-02 ENCOUNTER — Ambulatory Visit (HOSPITAL_COMMUNITY): Payer: Medicare Other

## 2022-01-08 ENCOUNTER — Ambulatory Visit (HOSPITAL_COMMUNITY)
Admission: RE | Admit: 2022-01-08 | Discharge: 2022-01-08 | Disposition: A | Discharge status: Home / Self Care | Payer: Medicare Other | Source: Ambulatory Visit | Attending: Nurse Practitioner | Admitting: Nurse Practitioner

## 2022-01-08 ENCOUNTER — Ambulatory Visit: Payer: Medicare Other | Admitting: Internal Medicine

## 2022-01-08 ENCOUNTER — Other Ambulatory Visit (HOSPITAL_COMMUNITY): Payer: Self-pay | Admitting: Nurse Practitioner

## 2022-01-08 DIAGNOSIS — R062 Wheezing: Secondary | ICD-10-CM | POA: Diagnosis not present

## 2022-01-08 DIAGNOSIS — R059 Cough, unspecified: Secondary | ICD-10-CM | POA: Diagnosis not present

## 2022-01-08 DIAGNOSIS — R0981 Nasal congestion: Secondary | ICD-10-CM | POA: Diagnosis not present

## 2022-01-08 DIAGNOSIS — R52 Pain, unspecified: Secondary | ICD-10-CM | POA: Diagnosis not present

## 2022-01-08 DIAGNOSIS — J069 Acute upper respiratory infection, unspecified: Secondary | ICD-10-CM | POA: Diagnosis not present

## 2022-01-10 ENCOUNTER — Ambulatory Visit (HOSPITAL_COMMUNITY): Payer: Medicare Other

## 2022-01-22 DIAGNOSIS — R059 Cough, unspecified: Secondary | ICD-10-CM | POA: Diagnosis not present

## 2022-01-22 DIAGNOSIS — R52 Pain, unspecified: Secondary | ICD-10-CM | POA: Diagnosis not present

## 2022-01-22 DIAGNOSIS — R0981 Nasal congestion: Secondary | ICD-10-CM | POA: Diagnosis not present

## 2022-01-22 DIAGNOSIS — R7303 Prediabetes: Secondary | ICD-10-CM | POA: Diagnosis not present

## 2022-01-22 DIAGNOSIS — J069 Acute upper respiratory infection, unspecified: Secondary | ICD-10-CM | POA: Diagnosis not present

## 2022-01-22 DIAGNOSIS — E785 Hyperlipidemia, unspecified: Secondary | ICD-10-CM | POA: Diagnosis not present

## 2022-01-22 DIAGNOSIS — I10 Essential (primary) hypertension: Secondary | ICD-10-CM | POA: Diagnosis not present

## 2022-01-22 DIAGNOSIS — R5381 Other malaise: Secondary | ICD-10-CM | POA: Diagnosis not present

## 2022-01-22 DIAGNOSIS — R062 Wheezing: Secondary | ICD-10-CM | POA: Diagnosis not present

## 2022-01-28 DIAGNOSIS — Z733 Stress, not elsewhere classified: Secondary | ICD-10-CM | POA: Diagnosis not present

## 2022-01-28 DIAGNOSIS — I700 Atherosclerosis of aorta: Secondary | ICD-10-CM | POA: Diagnosis not present

## 2022-01-28 DIAGNOSIS — E785 Hyperlipidemia, unspecified: Secondary | ICD-10-CM | POA: Diagnosis not present

## 2022-01-28 DIAGNOSIS — E559 Vitamin D deficiency, unspecified: Secondary | ICD-10-CM | POA: Diagnosis not present

## 2022-01-28 DIAGNOSIS — I10 Essential (primary) hypertension: Secondary | ICD-10-CM | POA: Diagnosis not present

## 2022-01-28 DIAGNOSIS — R7303 Prediabetes: Secondary | ICD-10-CM | POA: Diagnosis not present

## 2022-01-28 DIAGNOSIS — R0981 Nasal congestion: Secondary | ICD-10-CM | POA: Diagnosis not present

## 2022-01-28 DIAGNOSIS — D649 Anemia, unspecified: Secondary | ICD-10-CM | POA: Diagnosis not present

## 2022-01-28 DIAGNOSIS — I872 Venous insufficiency (chronic) (peripheral): Secondary | ICD-10-CM | POA: Diagnosis not present

## 2022-01-28 DIAGNOSIS — M25519 Pain in unspecified shoulder: Secondary | ICD-10-CM | POA: Diagnosis not present

## 2022-01-28 DIAGNOSIS — R011 Cardiac murmur, unspecified: Secondary | ICD-10-CM | POA: Diagnosis not present

## 2022-02-10 ENCOUNTER — Encounter: Payer: Self-pay | Admitting: Obstetrics & Gynecology

## 2022-02-10 ENCOUNTER — Ambulatory Visit: Payer: Medicare Other | Admitting: Obstetrics & Gynecology

## 2022-02-10 VITALS — BP 137/87 | HR 86

## 2022-02-10 DIAGNOSIS — N813 Complete uterovaginal prolapse: Secondary | ICD-10-CM | POA: Diagnosis not present

## 2022-02-10 DIAGNOSIS — Z4689 Encounter for fitting and adjustment of other specified devices: Secondary | ICD-10-CM | POA: Diagnosis not present

## 2022-02-10 NOTE — Progress Notes (Signed)
Chief Complaint  Patient presents with   Pessary Check    Blood pressure 137/87, pulse 86.  Barbara Lucero presents today for routine follow up related to her pessary.   She uses a Milex ring with support #5 She reports no vaginal discharge and no vaginal bleeding   Likert scale(1 not bothersome -5 very bothersome)  :  1  Exam reveals no undue vaginal mucosal pressure of breakdown, no discharge and no vaginal bleeding.  Vaginal Epithelial Abnormality Classification System:   0 0    No abnormalities 1    Epithelial erythema 2    Granulation tissue 3    Epithelial break or erosion, 1 cm or less 4    Epithelial break or erosion, 1 cm or greater  The pessary is removed, cleaned and replaced without difficulty.      ICD-10-CM   1. 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  Lazaro Arms, MD  02/10/2022 1:54 PM

## 2022-03-13 DIAGNOSIS — R350 Frequency of micturition: Secondary | ICD-10-CM | POA: Diagnosis not present

## 2022-03-13 DIAGNOSIS — N3000 Acute cystitis without hematuria: Secondary | ICD-10-CM | POA: Diagnosis not present

## 2022-04-15 ENCOUNTER — Telehealth: Payer: Self-pay | Admitting: Cardiovascular Disease

## 2022-04-15 NOTE — Telephone Encounter (Signed)
Patient wants to reschedule Echo test and will need new order.

## 2022-04-28 ENCOUNTER — Ambulatory Visit: Payer: Medicare Other | Admitting: Physician Assistant

## 2022-05-15 ENCOUNTER — Ambulatory Visit (HOSPITAL_COMMUNITY)
Admission: RE | Admit: 2022-05-15 | Discharge: 2022-05-15 | Disposition: A | Discharge status: Home / Self Care | Payer: Medicare Other | Source: Ambulatory Visit | Attending: Cardiovascular Disease | Admitting: Cardiovascular Disease

## 2022-05-15 DIAGNOSIS — I350 Nonrheumatic aortic (valve) stenosis: Secondary | ICD-10-CM | POA: Diagnosis not present

## 2022-05-15 LAB — ECHOCARDIOGRAM COMPLETE
AR max vel: 0.76 cm2
AV Area VTI: 0.64 cm2
AV Area mean vel: 0.69 cm2
AV Mean grad: 45.7 mmHg
AV Peak grad: 78.5 mmHg
Ao pk vel: 4.43 m/s
Area-P 1/2: 2.84 cm2
S' Lateral: 2.8 cm

## 2022-05-15 NOTE — Progress Notes (Signed)
Cardiology Office Note   Date:  05/16/2022   ID:  Barbara, Lucero 11-17-1940, MRN [id number]  PCP:  Celene Squibb, MD  Cardiologist:  Dr. Johnsie Cancel    Chief Complaint  Patient presents with   Aortic Stenosis      History of Present Illness: Barbara Lucero is a 82 y.o. female who presents for severe AS.   Last seen 4/2023history of Anxiety, depression Crohn disease and arthritis Referred by Dr Luan Pulling for irregular pulse and murmur on 08/09/19  She sees Dr Trula Slade for DVT f/u. 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 Stephens Shire last note 05/21/20 it was felt that her DVT was more related to her Crohn's so no longer on anticoagulation.  She did continue with pain and swelling and discoloration in both legs.    She has had a hard life. She has a 37 yo son with CP who is bed bound now with supra pubic catheter she cares For by herself. She has a daughter with bi polar disease living with her and 23 yo grand daughter showing signs of mental health disease    TTE 08/21/20 showed moderate AS with mean gradient 33 peak 53 mmHg  TTE 04/09/21 showed severe AS mean gradient 38 mmHg peak 60 mmHg AVA 0.78 cm2 and DVI 0.28 cm2   Last visit with Dr. Johnsie Cancel long discussion with her about progressive AS. 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 Given lack of symptoms favor observation with echo in 6 months She understands that she will likely need TAVR evaluation within the next year or two at most. Symptoms of dyspnea, chest pain, syncope discussed    She was comfortable with observation on that visit.  The  process of TAVR evaluation including cath , CTA, and surgical consultation also discussed.  DVT lifelong long eliquis   AS last Echo with progression to severe. Asymptomatic follow up echo with severe AS and worse,  neds w/u for TAVR  here to arrange cath,  cardicac CTA with Tavr protocol EF 60-65% Aortic valve  mean gradient measures 45.7 mmHg. Aortic valve peak gradient measures 78.5 mmHg. Aortic valve area, by VTI measures 0.64 cm.  G1DD  LA severely dilated.  CT angio 11/22/21 eg PE, RUL solid pulmonary nodule 11 mm  3 vessel CAD done on admit for sepsis. And sepsis secondary to colitis.   Today she has no chest pain and no SOB, does have some lower ext edema.  Has not taken any lasix and her BP is elevated.  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  We discussed her aortic stenosis and replacement with OHS vs TAVR.  Discussed symptoms of AS and to go to hospital if any syncope and to call if increased SOB or edema.       Past Medical History:  Diagnosis Date   Anxiety    Arthritis    Crohn disease ([place])    Depression    Varicose veins of bilateral lower extremities with other complications     Past Surgical History:  Procedure Laterality Date   BOWEL RESECTION     HIP ARTHROPLASTY Left 06/06/2016   Procedure: ARTHROPLASTY BIPOLAR HIP (HEMIARTHROPLASTY);  Surgeon: Carole Civil, MD;  Location: [facility];  Service: Orthopedics;  Laterality: Left;   lt hip replacement       Current Outpatient Medications  Medication Sig Dispense Refill   acetaminophen (TYLENOL) 650 MG CR tablet Take 650 mg by mouth every 8 (eight) hours as needed for pain.     albuterol (VENTOLIN HFA) 108 (90 Base) MCG/ACT inhaler Inhale 2 puffs into the lungs every 4 (four) hours as needed.     ALPRAZolam (XANAX) 0.25 MG tablet Take 0.25 mg by mouth 2 (two) times daily.     Ascorbic Acid (VITAMIN C) 1000 MG tablet Take 1,000 mg by mouth.      b complex vitamins tablet Take 1 tablet by mouth daily.      CALCIUM PO Take 1 tablet by mouth daily.     gabapentin (NEURONTIN) 100 MG capsule Take 1 capsule (100 mg total) by mouth 3 (three) times daily. 90 capsule 1   olmesartan (BENICAR) 40 MG tablet Take 40 mg by mouth daily.     pantoprazole (PROTONIX) 40 MG tablet Take 40 mg by mouth daily.     Psyllium (METAMUCIL PO) Take 1 Package by mouth daily as needed (constipation).     sertraline (ZOLOFT) 100 MG tablet Take 100 mg by mouth daily.     simvastatin (ZOCOR) 10 MG tablet Take 10 mg by mouth at bedtime.     solifenacin (VESICARE) 10 MG tablet TAKE (1) TABLET BY MOUTH AT BEDTIME. (Patient taking differently: Take 10 mg by mouth at bedtime.) 30 tablet 11   sulfaSALAzine (AZULFIDINE) 500 MG tablet Take 1 tablet (500 mg total) by mouth 2 (two) times daily. 60 tablet 2   vitamin E 400 UNIT capsule Take 400 Units by mouth daily.     COLLAGEN PO Take 1 tablet by mouth daily. (Patient not taking: Reported on 05/16/2022)     furosemide (LASIX) 20 MG tablet Take 20 mg by mouth daily. (Patient not taking: Reported on 05/16/2022)     MYRBETRIQ 50 MG TB24 tablet TAKE (1) TABLET BY MOUTH AT BEDTIME. (Patient not taking: Reported on 05/16/2022) 30 tablet 11   predniSONE (DELTASONE) 10 MG tablet Take 40 mg by mouth daily x1 week; then 30 mg by mouth daily x1 week; then 20 mg by mouth daily x1 week; then 10 mg by mouth daily x1 week and stop prednisone. (Patient not taking: Reported on 05/16/2022) 70 tablet 0   No current facility-administered medications for this visit.    Allergies:   Coconut (cocos nucifera) and Latex    Social History:  The patient  reports that she has never smoked. She has never used smokeless tobacco. She reports that she does not drink alcohol and does not use drugs.   Family History:  The patient's family history includes Bipolar disorder in her daughter; Breast cancer in her sister and sister; Other in her son.    ROS:  General:no colds or fevers, no weight changes Skin:no rashes or  ulcers HEENT:no blurred vision, no congestion CV:see HPI also with PAD  followed by Dr. Trula Slade PUL:see HPI GI:no diarrhea constipation or melena, no indigestion GU:no hematuria, no dysuria- has pessary followed by Dr. Levell July MS:no joint pain, no claudication  has leg pain  walks with walker air mount of the time - at home not so much Neuro:no  syncope, no lightheadedness Endo:no diabetes, no thyroid disease  Wt Readings from Last 3 Encounters:  05/16/22 173 lb 9.6 oz (78.7 kg)  11/24/21 187 lb 6.3 oz (85 kg)  10/08/21 182 lb (82.6 kg)     PHYSICAL EXAM: VS:  BP (!) 148/80   Pulse 70   Ht 5\' 3"  (1.6 m)   Wt 173 lb 9.6 oz (78.7 kg)   SpO2 93%   BMI 30.75 kg/m  , BMI Body mass index is 30.75 kg/m. General:Pleasant affect, NAD Skin:Warm and dry, brisk capillary refill HEENT:normocephalic, sclera clear, mucus membranes moist Neck:supple, no JVD, no bruits  Heart:S1S2 RRR without murmur, gallup, rub or click Lungs:clear without rales, rhonchi, or wheezes [id number], non tender, + BS, do not palpate liver spleen or masses Ext:no lower ext edema, 2+ pedal pulses, 2+ radial pulses Neuro:alert and oriented, MAE, follows commands, + facial symmetry    EKG:  EKG is ordered today. The ekg ordered today demonstrates SR at 65 and no acute ST changes.    Recent Labs: 11/23/2021: ALT 11; Magnesium 2.2; TSH 1.557 11/24/2021: BUN 11; Creatinine, Ser 0.72; Hemoglobin 10.3; Platelets 155; Potassium 4.1; Sodium 141    Lipid Panel No results found for: "CHOL", "TRIG", "HDL", "CHOLHDL", "VLDL", "LDLCALC", "LDLDIRECT"     Other studies Reviewed: Additional studies/ records that were reviewed today include: . Echo 05/15/22  IMPRESSIONS     1. Left ventricular ejection fraction, by estimation, is 60 to 65%. The  left ventricle has normal function. The left ventricle has no regional  wall motion abnormalities. Left ventricular diastolic parameters are  consistent with Grade I  diastolic  dysfunction (impaired relaxation). Elevated left atrial pressure.   2. Right ventricular systolic function is normal. The right ventricular  size is normal. There is mildly elevated pulmonary artery systolic  pressure.   3. Left atrial size was severely dilated.   4. The mitral valve is normal in structure. Trivial mitral valve  regurgitation. No evidence of mitral stenosis.   5. The tricuspid valve is abnormal.   6. The aortic valve has an indeterminant number of cusps. There is  moderate calcification of the aortic valve. There is moderate thickening  of the aortic valve. Aortic valve regurgitation is not visualized. Severe  aortic valve stenosis. Aortic valve  mean gradient measures 45.7 mmHg. Aortic valve peak gradient measures 78.5  mmHg. Aortic valve area, by VTI measures 0.64 cm.   7. The inferior vena cava is normal in size with greater than 50%  respiratory variability, suggesting right atrial pressure of 3 mmHg.   FINDINGS   Left Ventricle: Left ventricular ejection fraction, by estimation, is 60  to 65%. The left ventricle has normal function. The left ventricle has no  regional wall motion abnormalities. Definity contrast agent was given IV  to delineate the left ventricular   endocardial borders. Global longitudinal strain performed but not  reported based on interpreter judgement due to suboptimal tracking. The  left ventricular internal cavity size was normal in size. There is no left  ventricular hypertrophy. Left  ventricular diastolic parameters are consistent with Grade I diastolic  dysfunction (impaired relaxation). Elevated left atrial pressure.   Right Ventricle: The right ventricular size is normal. Right vetricular  wall thickness was not well visualized. Right ventricular systolic  function is normal. There is mildly elevated pulmonary artery systolic  pressure. The tricuspid regurgitant velocity   is 2.76 m/s, and with an assumed right atrial  pressure of 8 mmHg, the  estimated right ventricular systolic pressure is [id number] mmHg.   Left Atrium: Left atrial size was severely dilated.   Right Atrium: Right atrial size was normal in size.   Pericardium: There is no evidence of pericardial effusion.   Mitral Valve: The mitral valve is normal in structure. Trivial mitral  valve regurgitation. No evidence of mitral valve stenosis. MV peak  gradient, 8.0 mmHg. The mean mitral valve gradient is 3.0 mmHg.   Tricuspid Valve: The tricuspid valve is abnormal. Tricuspid valve  regurgitation is mild . No evidence of tricuspid stenosis.   Aortic Valve: The aortic valve has an indeterminant number of cusps. There  is moderate calcification of the aortic valve. There is moderate  thickening of the aortic valve. There is moderate aortic valve annular  calcification. Aortic valve regurgitation  is not visualized. Severe aortic stenosis is present. Aortic valve mean  gradient measures 45.7 mmHg. Aortic valve peak gradient measures 78.5  mmHg. Aortic valve area, by VTI measures 0.64 cm.   Pulmonic Valve: The pulmonic valve was not well visualized. Pulmonic valve  regurgitation is not visualized. No evidence of pulmonic stenosis.   Aorta: The aortic root is normal in size and structure.   Venous: The inferior vena cava is normal in size with greater than 50%  respiratory variability, suggesting right atrial pressure of 3 mmHg.   IAS/Shunts: The interatrial septum was not well visualized.     LEFT VENTRICLE  PLAX 2D  LVIDd:         4.20 cm   Diastology  LVIDs:         2.80 cm   LV e' medial:    5.11 cm/s  LV PW:         1.00 cm   LV E/e' medial:  20.0  LV IVS:        1.00 cm   LV e' lateral:   8.59 cm/s  LVOT diam:     2.00 cm   LV E/e' lateral: 11.9  LV SV:         71  LV SV Index:   38  LVOT Area:     3.14 cm                             3D Volume EF:                           3D EF:        55 %                           LV EDV:        140 ml                           LV ESV:       64 ml                           LV SV:        77 ml   RIGHT VENTRICLE  RV S prime:     12.00 cm/s  TAPSE (M-mode): 2.0 cm   LEFT ATRIUM              Index        RIGHT ATRIUM  Index  LA diam:        4.50 cm  2.39 cm/m   RA Area:     16.00 cm  LA Vol (A2C):   116.0 ml 61.67 ml/m  RA Volume:   40.10 ml  21.32 ml/m  LA Vol (A4C):   75.4 ml  40.09 ml/m  LA Biplane Vol: 95.2 ml  50.62 ml/m   AORTIC VALVE                     PULMONIC VALVE  AV Area (Vmax):    0.76 cm      PV Vmax:       3.56 m/s  AV Area (Vmean):   0.69 cm      PV Peak grad:  50.7 mmHg  AV Area (VTI):     0.64 cm  AV Vmax:           443.00 cm/s  AV Vmean:          317.333 cm/s  AV VTI:            1.110 m  AV Peak Grad:      78.5 mmHg  AV Mean Grad:      45.7 mmHg  LVOT Vmax:         107.00 cm/s  LVOT Vmean:        69.800 cm/s  LVOT VTI:          0.225 m  LVOT/AV VTI ratio: 0.20    AORTA  Ao Root diam: 3.00 cm  Ao Asc diam:  3.10 cm   MITRAL VALVE                TRICUSPID VALVE  MV Area (PHT): 2.84 cm     TR Peak grad:   30.5 mmHg  MV Peak grad:  8.0 mmHg     TR Vmax:        276.00 cm/s  MV Mean grad:  3.0 mmHg  MV Vmax:       1.41 m/s     SHUNTS  MV Vmean:      80.7 cm/s    Systemic VTI:  0.22 m  MV Decel Time: 267 msec     Systemic Diam: 2.00 cm  MV E velocity: 102.00 cm/s  MV A velocity: 144.00 cm/s  MV E/A ratio:  0.71    ECHO 06/18/21 IMPRESSIONS     1. Left ventricular ejection fraction, by estimation, is 60 to 65%. The  left ventricle has normal function. The left ventricle has no regional  wall motion abnormalities. There is mild concentric left ventricular  hypertrophy. Left ventricular diastolic  parameters are consistent with Grade I diastolic dysfunction (impaired  relaxation). Elevated left ventricular end-diastolic pressure.   2. Right ventricular systolic function is normal. The right ventricular  size is normal.  Tricuspid regurgitation signal is inadequate for assessing  PA pressure.   3. Left atrial size was mildly dilated.   4. The mitral valve is degenerative. No evidence of mitral valve  regurgitation. No evidence of mitral stenosis. Moderate mitral annular  calcification.   5. The aortic valve is tricuspid. There is moderate calcification of the  aortic valve. There is moderate thickening of the aortic valve. Aortic  valve regurgitation is mild. Severe aortic valve stenosis. Aortic valve  area, by VTI measures 0.79 cm.  Aortic valve mean gradient measures 38.5 mmHg. Aortic valve Vmax measures  4.11 m/s.   6. The inferior vena cava is  normal in size with greater than 50%  respiratory variability, suggesting right atrial pressure of 3 mmHg.   7. Compared to echo dated 04/09/2021, mean AVG has not changed much and  DVI has increased from 0.27 to 0.31 and VMax has increased from 3.51m/s to  4.29m/s.   FINDINGS   Left Ventricle: Left ventricular ejection fraction, by estimation, is 60  to 65%. The left ventricle has normal function. The left ventricle has no  regional wall motion abnormalities. The left ventricular internal cavity  size was normal in size. There is   mild concentric left ventricular hypertrophy. Left ventricular diastolic  parameters are consistent with Grade I diastolic dysfunction (impaired  relaxation). Elevated left ventricular end-diastolic pressure.   Right Ventricle: The right ventricular size is normal. No increase in  right ventricular wall thickness. Right ventricular systolic function is  normal. Tricuspid regurgitation signal is inadequate for assessing PA  pressure.   Left Atrium: Left atrial size was mildly dilated.   Right Atrium: Right atrial size was normal in size.   Pericardium: There is no evidence of pericardial effusion.   Mitral Valve: The mitral valve is degenerative in appearance. There is  mild calcification of the anterior mitral valve  leaflet(s). Moderate  mitral annular calcification. No evidence of mitral valve regurgitation.  No evidence of mitral valve stenosis. MV  peak gradient, 5.6 mmHg. The mean mitral valve gradient is 2.0 mmHg.   Tricuspid Valve: The tricuspid valve is normal in structure. Tricuspid  valve regurgitation is not demonstrated. No evidence of tricuspid  stenosis.   Aortic Valve: The aortic valve is tricuspid. There is moderate  calcification of the aortic valve. There is moderate thickening of the  aortic valve. Aortic valve regurgitation is mild. Severe aortic stenosis  is present. Aortic valve mean gradient measures   38.5 mmHg. Aortic valve peak gradient measures 67.4 mmHg. Aortic valve  area, by VTI measures 0.79 cm.   Pulmonic Valve: The pulmonic valve was normal in structure. Pulmonic valve  regurgitation is not visualized. No evidence of pulmonic stenosis.   Aorta: The aortic root is normal in size and structure.   Venous: The inferior vena cava is normal in size with greater than 50%  respiratory variability, suggesting right atrial pressure of 3 mmHg.   IAS/Shunts: No atrial level shunt detected by color flow Doppler.     LEFT VENTRICLE  PLAX 2D  LVIDd:         4.90 cm      Diastology  LVIDs:         3.30 cm      LV e' medial:    4.35 cm/s  LV PW:         1.20 cm      LV E/e' medial:  21.1  LV IVS:        1.20 cm      LV e' lateral:   7.29 cm/s  LVOT diam:     1.80 cm      LV E/e' lateral: 12.6  LV SV:         87  LV SV Index:   46  LVOT Area:     2.54 cm    LV Volumes (MOD)  LV vol d, MOD A2C: 88.6 ml  LV vol d, MOD A4C: 111.0 ml  LV vol s, MOD A2C: 33.7 ml  LV vol s, MOD A4C: 40.7 ml  LV SV MOD A2C:     54.9 ml  LV SV  MOD A4C:     111.0 ml  LV SV MOD BP:      62.2 ml   RIGHT VENTRICLE  RV Basal diam:  3.25 cm  RV Mid diam:    2.80 cm  RV S prime:     8.92 cm/s  TAPSE (M-mode): 2.5 cm   LEFT ATRIUM             Index        RIGHT ATRIUM           Index  LA  diam:        3.70 cm 1.96 cm/m   RA Area:     14.80 cm  LA Vol (A2C):   76.9 ml 40.83 ml/m  RA Volume:   35.50 ml  18.85 ml/m  LA Vol (A4C):   60.3 ml 32.02 ml/m  LA Biplane Vol: 69.5 ml 36.90 ml/m   AORTIC VALVE                     PULMONIC VALVE  AV Area (Vmax):    0.80 cm      PV Vmax:       0.59 m/s  AV Area (Vmean):   0.70 cm      PV Peak grad:  1.4 mmHg  AV Area (VTI):     0.79 cm  AV Vmax:           410.50 cm/s  AV Vmean:          285.500 cm/s  AV VTI:            1.095 m  AV Peak Grad:      67.4 mmHg  AV Mean Grad:      38.5 mmHg  LVOT Vmax:         129.00 cm/s  LVOT Vmean:        78.100 cm/s  LVOT VTI:          0.340 m  LVOT/AV VTI ratio: 0.31    AORTA  Ao Root diam: 2.60 cm   MITRAL VALVE  MV Area (PHT): 2.50 cm     SHUNTS  MV Area VTI:   2.32 cm     Systemic VTI:  0.34 m  MV Peak grad:  5.6 mmHg     Systemic Diam: 1.80 cm  MV Mean grad:  2.0 mmHg  MV Vmax:       1.18 m/s  MV Vmean:      65.0 cm/s  MV Decel Time: 304 msec  MV E velocity: 91.70 cm/s  MV A velocity: 123.00 cm/s  MV E/A ratio:  0.75    ASSESSMENT AND PLAN:  1.  Severe AS, discussed with pt and daughter.  They hope for TAVR,  they prefer to wait until after May 8th when she will have 82th Birthday party, will check with Dr. Johnsie Cancel if this is fine.  I discussed with Theodosia Quay with TAVR team and she will see Dr. Angelena Form 4/29 , they are aware of symptoms that may need urgent care.   Did discuss cardiac cath- did not go into risks.   No symptoms except mild lower ext edema.  On CT chest in 10/2021 + coronary calcifications.  EKG today with plan for cardiac cath  2.  Pulmonary nodule 11 mm seen on CT in Sept 2023 - has not had repeat CT as of yet.  Will go ahead and arrange to eval if smaller. May need Pulmonary referral  if increase.  3.  HTN BP elevated today and recheck down slightly, she does have lower ext edema legs so she will begin lasix 20 mg once per week.  We will have her check BP MWF  and call in results to see if needs more medication.  Consider amlodipine 2.5 mg po   she believes BP elevated in provider office, though on past visits I can see not this high--  in Dec with OBGYN she was 137/87  (she has a pessary)  4.  Hx DVT no longer on eliquis - related to hip surgery and perhaps Crohns  5.  Crohns disease stable followed by GI   6.  Carotid bruits, vs transmitted murmur with duplex 2021 plaque and no stenosis   Follow up with Dr. Johnsie Cancel in 6-8 weeks.  To see Structural heart team.06/23/22  Current medicines are reviewed with the patient today.  The patient Has no concerns regarding medicines.  The following changes have been made:  See above Labs/ tests ordered today include:see above  Disposition:   FU:  see above  Signed, Cecilie Kicks, NP  05/16/2022 2:36 PM    [facility] [place], [place], [place] Phone: [phone]; Fax: [phone]

## 2022-05-15 NOTE — Progress Notes (Signed)
  Echocardiogram 2D Echocardiogram has been performed.  [name] 05/15/2022, 12:38 PM

## 2022-05-16 ENCOUNTER — Encounter: Payer: Self-pay | Admitting: Cardiology

## 2022-05-16 ENCOUNTER — Ambulatory Visit: Payer: Medicare Other | Attending: Cardiovascular Disease | Admitting: Cardiology

## 2022-05-16 VITALS — BP 148/80 | HR 70 | Ht 63.0 in | Wt 173.6 lb

## 2022-05-16 DIAGNOSIS — K5000 Crohn's disease of small intestine without complications: Secondary | ICD-10-CM | POA: Diagnosis not present

## 2022-05-16 DIAGNOSIS — I350 Nonrheumatic aortic (valve) stenosis: Secondary | ICD-10-CM

## 2022-05-16 DIAGNOSIS — I10 Essential (primary) hypertension: Secondary | ICD-10-CM

## 2022-05-16 DIAGNOSIS — R911 Solitary pulmonary nodule: Secondary | ICD-10-CM | POA: Diagnosis not present

## 2022-05-16 DIAGNOSIS — I739 Peripheral vascular disease, unspecified: Secondary | ICD-10-CM

## 2022-05-16 DIAGNOSIS — Z01818 Encounter for other preprocedural examination: Secondary | ICD-10-CM

## 2022-05-16 NOTE — Patient Instructions (Signed)
Medication Instructions:  Your physician recommends that you continue on your current medications as directed. Please refer to the Current Medication list given to you today.  Take Lasix 1 time Weekly  Check your Blood Pressure on Monday, Wednesday, Friday and call to office in 2 weeks with Results   *If you need a refill on your cardiac medications before your next appointment, please call your pharmacy*   Lab Work: NONE   If you have labs (blood work) drawn today and your tests are completely normal, you will receive your results only by: [place] (if you have MyChart) OR A paper copy in the mail If you have any lab test that is abnormal or we need to change your treatment, we will call you to review the results.   Testing/Procedures: Non-Cardiac CT scanning, (CAT scanning), is a noninvasive, special x-ray that produces cross-sectional images of the body using x-rays and a computer. CT scans help physicians diagnose and treat medical conditions. For some CT exams, a contrast material is used to enhance visibility in the area of the body being studied. CT scans provide greater clarity and reveal more details than regular x-ray exams.    Follow-Up: At [facility], you and your health needs are our priority.  As part of our continuing mission to provide you with exceptional heart care, we have created designated Provider Care Teams.  These Care Teams include your primary Cardiologist (physician) and Advanced Practice Providers (APPs -  Physician Assistants and Nurse Practitioners) who all work together to provide you with the care you need, when you need it.  We recommend signing up for the patient portal called "MyChart".  Sign up information is provided on this After Visit Summary.  MyChart is used to connect with patients for Virtual Visits (Telemedicine).  Patients are able to view lab/test results, encounter notes, upcoming appointments, etc.  Non-urgent messages can be  sent to your provider as well.   To learn more about what you can do with MyChart, go to [email or web address].    Your next appointment:   6-8 week(s)  Provider:   Jenkins Rouge, MD    Other Instructions Thank you for choosing [facility]!

## 2022-05-19 ENCOUNTER — Telehealth: Payer: Self-pay | Admitting: *Deleted

## 2022-05-19 DIAGNOSIS — I350 Nonrheumatic aortic (valve) stenosis: Secondary | ICD-10-CM

## 2022-05-19 NOTE — Telephone Encounter (Signed)
Called to notify pt no answer, Left msg to call back. Orders placed

## 2022-05-19 NOTE — Telephone Encounter (Signed)
-----   Message from Barbara Hector, MD sent at 05/15/2022  3:12 PM EDT ----- AS severe and worse needs to be w/u for TAVR  Schedule cardiac CTA with TAVR protocol  Can see me or PA to arrange cath   Have cc Clarise Cruz regarding CT and Ned Grace from structural

## 2022-05-21 DIAGNOSIS — R7303 Prediabetes: Secondary | ICD-10-CM | POA: Diagnosis not present

## 2022-05-22 LAB — LAB REPORT - SCANNED
Albumin, Urine POC: 56.6
Creatinine, POC: 168.1 mg/dL
EGFR: 74
Microalb Creat Ratio: 34

## 2022-05-29 DIAGNOSIS — I350 Nonrheumatic aortic (valve) stenosis: Secondary | ICD-10-CM | POA: Diagnosis not present

## 2022-05-29 DIAGNOSIS — I700 Atherosclerosis of aorta: Secondary | ICD-10-CM | POA: Diagnosis not present

## 2022-05-29 DIAGNOSIS — Z0001 Encounter for general adult medical examination with abnormal findings: Secondary | ICD-10-CM | POA: Diagnosis not present

## 2022-05-29 DIAGNOSIS — I872 Venous insufficiency (chronic) (peripheral): Secondary | ICD-10-CM | POA: Diagnosis not present

## 2022-05-29 DIAGNOSIS — K5080 Crohn's disease of both small and large intestine without complications: Secondary | ICD-10-CM | POA: Diagnosis not present

## 2022-05-29 DIAGNOSIS — E559 Vitamin D deficiency, unspecified: Secondary | ICD-10-CM | POA: Diagnosis not present

## 2022-05-29 DIAGNOSIS — R011 Cardiac murmur, unspecified: Secondary | ICD-10-CM | POA: Diagnosis not present

## 2022-05-29 DIAGNOSIS — R911 Solitary pulmonary nodule: Secondary | ICD-10-CM | POA: Diagnosis not present

## 2022-05-29 DIAGNOSIS — D649 Anemia, unspecified: Secondary | ICD-10-CM | POA: Diagnosis not present

## 2022-05-29 DIAGNOSIS — I10 Essential (primary) hypertension: Secondary | ICD-10-CM | POA: Diagnosis not present

## 2022-05-29 DIAGNOSIS — R7303 Prediabetes: Secondary | ICD-10-CM | POA: Diagnosis not present

## 2022-05-29 DIAGNOSIS — E785 Hyperlipidemia, unspecified: Secondary | ICD-10-CM | POA: Diagnosis not present

## 2022-06-12 ENCOUNTER — Ambulatory Visit: Payer: Medicare Other | Admitting: Obstetrics & Gynecology

## 2022-06-16 ENCOUNTER — Encounter: Payer: Self-pay | Admitting: Obstetrics & Gynecology

## 2022-06-16 ENCOUNTER — Ambulatory Visit: Payer: Medicare Other | Admitting: Obstetrics & Gynecology

## 2022-06-16 VITALS — BP 138/80 | HR 75

## 2022-06-16 DIAGNOSIS — Z4689 Encounter for fitting and adjustment of other specified devices: Secondary | ICD-10-CM

## 2022-06-16 DIAGNOSIS — N813 Complete uterovaginal prolapse: Secondary | ICD-10-CM

## 2022-06-16 DIAGNOSIS — N3281 Overactive bladder: Secondary | ICD-10-CM

## 2022-06-16 MED ORDER — MIRABEGRON ER 50 MG PO TB24: ORAL_TABLET | ORAL | 11 refills | Status: DC

## 2022-06-16 NOTE — Progress Notes (Signed)
Chief Complaint  Patient presents with   Pessary Check    Blood pressure 138/80, pulse 75.  Barbara Lucero presents today for routine follow up related to her pessary.   She uses a Milex ring with support #5 She reports no vaginal discharge and no vaginal bleeding   Likert scale(1 not bothersome -5 very bothersome)  :  1  Exam reveals no undue vaginal mucosal pressure of breakdown, no discharge and no vaginal bleeding.  Vaginal Epithelial Abnormality Classification System:   0 0    No abnormalities 1    Epithelial erythema 2    Granulation tissue 3    Epithelial break or erosion, 1 cm or less 4    Epithelial break or erosion, 1 cm or greater  The pessary is removed, cleaned and replaced without difficulty.      ICD-10-CM   1. 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 OAB (overactive bladder): chronic + suboptimally managed  N32.81       Meds ordered this encounter  Medications   mirabegron ER (MYRBETRIQ) 50 MG TB24 tablet    Sig: TAKE (1) TABLET BY MOUTH AT BEDTIME.    Dispense:  30 tablet    Refill:  11    [name] will be sen back in 4 months for continued follow up.  Lazaro Arms, MD  06/16/2022 1:58 PM

## 2022-06-18 ENCOUNTER — Telehealth: Payer: Self-pay | Admitting: Cardiovascular Disease

## 2022-06-18 NOTE — Telephone Encounter (Signed)
Pt's daughter calling with bp readings:  4/16- 154/78 4/17- 136/75 4/19- 131/69 4/20- 137/75 4/22- 138/80   Please advise.

## 2022-06-18 NOTE — Telephone Encounter (Signed)
Patient's daughter is requesting a call back from Dr. Fabio Bering nurse to discuss BP readings.

## 2022-06-19 NOTE — Telephone Encounter (Signed)
Spoke to daughter who verbalized understanding. Daughter had no further questions or concerns at this time.  

## 2022-06-23 ENCOUNTER — Ambulatory Visit: Payer: Medicare Other | Attending: Cardiovascular Disease | Admitting: Cardiovascular Disease

## 2022-06-23 ENCOUNTER — Encounter: Payer: Self-pay | Admitting: Cardiovascular Disease

## 2022-06-23 VITALS — BP 128/60 | HR 71 | Ht 63.0 in | Wt 172.4 lb

## 2022-06-23 DIAGNOSIS — Z01812 Encounter for preprocedural laboratory examination: Secondary | ICD-10-CM

## 2022-06-23 DIAGNOSIS — I350 Nonrheumatic aortic (valve) stenosis: Secondary | ICD-10-CM | POA: Diagnosis not present

## 2022-06-23 NOTE — Progress Notes (Signed)
Pre Surgical Assessment: 5 M Walk Test  46M=16.31ft  5 Meter Walk Test- trial 1: 7.27 seconds 5 Meter Walk Test- trial 2: 7.24 seconds 5 Meter Walk Test- trial 3: 8.33 seconds 5 Meter Walk Test Average: 7.61 seconds

## 2022-06-23 NOTE — H&P (View-Only) (Signed)
 [facility] Consult Note  Chief Complaint  Patient presents with   New Patient (Initial Visit)    Severe aortic stenosis   History of Present Illness: 81 yo female with history of anxiety, depression, Crohn's disease, prior DVT, HTN, HLD and severe aortic stenosis who is here today as a new consult, referred by Dr. Nishan, for further discussion regarding her aortic stenosis and possible TAVR. She has been followed by Dr. Nishan for moderate aortic stenosis. Echo March 2024 with LVEF=60-65%. Severe aortic stenosis with calcified, thickened leaflets, mean gradient 45.7 mmHg, peak gradient 78.5 mmHg, AVA 0.64 cm2, DI 0.20. She has chronic back pain and uses a walker when she is out of the house. She is no longer on anti-coagulation for DVT.   She tells me today that she has progressive dyspnea and fatigue. No chest pain, dizziness or LE edema.  She lives in [place] with her daughter. She no longer works. 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 He passed away last year. She has full dentures.   Primary Care Physician: Hall, John Z, MD Primary Cardiologist: Nishan Referring Cardiologist: Nishan  Past Medical History:  Diagnosis Date   Anxiety    Arthritis    Crohn disease (HCC)    Depression    HTN (hypertension)    Hyperlipidemia    Neuropathy    Severe aortic stenosis    Varicose veins of bilateral lower extremities with other complications     Past Surgical History:  Procedure Laterality Date   BOWEL RESECTION     HIP ARTHROPLASTY Left 06/06/2016   Procedure: ARTHROPLASTY BIPOLAR HIP (HEMIARTHROPLASTY);  Surgeon: Stanley E Harrison, MD;  Location: [facility];  Service: Orthopedics;  Laterality: Left;    Current Outpatient Medications  Medication Sig Dispense Refill   acetaminophen (TYLENOL) 650 MG CR tablet Take 650 mg by mouth every 8 (eight) hours as needed for pain.     albuterol (VENTOLIN HFA) 108 (90 Base) MCG/ACT inhaler Inhale 2 puffs into the  lungs every 4 (four) hours as needed.     ALPRAZolam (XANAX) 0.25 MG tablet Take 0.25 mg by mouth 2 (two) times daily.     Ascorbic Acid (VITAMIN C) 1000 MG tablet Take 1,000 mg by mouth.      b complex vitamins tablet Take 1 tablet by mouth daily.     CALCIUM PO Take 1 tablet by mouth daily.     COLLAGEN PO Take 1 tablet by mouth daily.     furosemide (LASIX) 20 MG tablet Take 20 mg by mouth daily.     gabapentin (NEURONTIN) 100 MG capsule Take 1 capsule (100 mg total) by mouth 3 (three) times daily. 90 capsule 1   mirabegron ER (MYRBETRIQ) 50 MG TB24 tablet TAKE (1) TABLET BY MOUTH AT BEDTIME. 30 tablet 11   olmesartan (BENICAR) 40 MG tablet Take 40 mg by mouth daily.     pantoprazole (PROTONIX) 40 MG tablet Take 40 mg by mouth daily.     predniSONE (DELTASONE) 10 MG tablet Take 40 mg by mouth daily x1 week; then 30 mg by mouth daily x1 week; then 20 mg by mouth daily x1 week; then 10 mg by mouth daily x1 week and stop prednisone. 70 tablet 0   Psyllium (METAMUCIL PO) Take 1 Package by mouth daily as needed (constipation).     sertraline (ZOLOFT) 100 MG tablet Take 100 mg by mouth daily.     simvastatin (ZOCOR) 10 MG   tablet Take 10 mg by mouth at bedtime.     sulfaSALAzine (AZULFIDINE) 500 MG tablet Take 1 tablet (500 mg total) by mouth 2 (two) times daily. 60 tablet 2   vitamin E 400 UNIT capsule Take 400 Units by mouth daily.     No current facility-administered medications for this visit.    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  Tobacco Use   Smoking status: Former    Packs/day: 0.50    Years: 20.00    Additional pack years: 0.00    Total pack years: 10.00    Types:  Cigarettes   Smokeless tobacco: Never  Vaping Use   Vaping Use: Never used  Substance and Sexual Activity   Alcohol use: No   Drug use: No   Sexual activity: Not Currently    Birth control/protection: Post-menopausal  Other Topics Concern   Not on file  Social History Narrative   Not on file   Social Determinants of Health   Financial Resource Strain: Not on file  Food Insecurity: No Food Insecurity (11/23/2021)   Hunger Vital Sign    Worried About Running Out of Food in the Last Year: Never true    Ran Out of Food in the Last Year: Never true  Transportation Needs: No Transportation Needs (11/23/2021)   PRAPARE - Transportation    Lack of Transportation (Medical): No    Lack of Transportation (Non-Medical): No  Physical Activity: Not on file  Stress: Not on file  Social Connections: Not on file  Intimate Partner Violence: Not At Risk (11/23/2021)   Humiliation, Afraid, Rape, and Kick questionnaire    Fear of Current or Ex-Partner: No    Emotionally Abused: No    Physically Abused: No    Sexually Abused: No    Family History  Problem Relation Age of Onset   Stroke Mother    Heart attack Father    Breast cancer Sister    Breast cancer Sister    Bipolar disorder Daughter    Other Son        disabled    Review of Systems:  As stated in the HPI and otherwise negative.   BP 128/60 (BP Location: Right Arm, Patient Position: Sitting, Cuff Size: Large)   Pulse 71   Ht 5' 3" (1.6 m)   Wt 78.2 kg   SpO2 98%   BMI 30.54 kg/m   Physical Examination: General: Well developed, well nourished, NAD  HEENT: OP clear, mucus membranes moist  SKIN: warm, dry. No rashes. Neuro: No focal deficits  Musculoskeletal: Muscle strength 5/5 all ext  Psychiatric: Mood and affect normal  Neck: No JVD, no carotid bruits, no thyromegaly, no lymphadenopathy.  Lungs:Clear bilaterally, no wheezes, rhonci, crackles Cardiovascular: Regular rate and rhythm. Loud, harsh, late peaking systolic  murmur.  Abdomen:Soft. Bowel sounds present. Non-tender.  Extremities: No lower extremity edema. Pulses are 2 + in the bilateral DP/PT.  EKG:  EKG is ordered today. The ekg ordered today demonstrates Sinus, PACs. Non-specific T wave abn  Echo March 2024:  1. Left ventricular ejection fraction, by estimation, is 60 to 65%.   The  left ventricle has normal function. The left ventricle has no regional  wall motion abnormalities. Left ventricular diastolic parameters are  consistent with Grade I diastolic  dysfunction (impaired relaxation). Elevated left atrial pressure.   2. Right ventricular systolic function is normal. The right ventricular  size is normal. There is mildly elevated pulmonary artery systolic  pressure.   3. Left atrial size was severely dilated.   4. The mitral valve is normal in structure. Trivial mitral valve  regurgitation. No evidence of mitral stenosis.   5. The tricuspid valve is abnormal.   6. The aortic valve has an indeterminant number of cusps. There is  moderate calcification of the aortic valve. There is moderate thickening  of the aortic valve. Aortic valve regurgitation is not visualized. Severe  aortic valve stenosis. Aortic valve  mean gradient measures 45.7 mmHg. Aortic valve peak gradient measures 78.5  mmHg. Aortic valve area, by VTI measures 0.64 cm.   7. The inferior vena cava is normal in size with greater than 50%  respiratory variability, suggesting right atrial pressure of 3 mmHg.   FINDINGS   Left Ventricle: Left ventricular ejection fraction, by estimation, is 60  to 65%. The left ventricle has normal function. The left ventricle has no  regional wall motion abnormalities. Definity contrast agent was given IV  to delineate the left ventricular   endocardial borders. Global longitudinal strain performed but not  reported based on interpreter judgement due to suboptimal tracking. The  left ventricular internal cavity size was normal in size.  There is no left  ventricular hypertrophy. Left  ventricular diastolic parameters are consistent with Grade I diastolic  dysfunction (impaired relaxation). Elevated left atrial pressure.   Right Ventricle: The right ventricular size is normal. Right vetricular  wall thickness was not well visualized. Right ventricular systolic  function is normal. There is mildly elevated pulmonary artery systolic  pressure. The tricuspid regurgitant velocity   is 2.76 m/s, and with an assumed right atrial pressure of 8 mmHg, the  estimated right ventricular systolic pressure is 38.5 mmHg.   Left Atrium: Left atrial size was severely dilated.   Right Atrium: Right atrial size was normal in size.   Pericardium: There is no evidence of pericardial effusion.   Mitral Valve: The mitral valve is normal in structure. Trivial mitral  valve regurgitation. No evidence of mitral valve stenosis. MV peak  gradient, 8.0 mmHg. The mean mitral valve gradient is 3.0 mmHg.   Tricuspid Valve: The tricuspid valve is abnormal. Tricuspid valve  regurgitation is mild . No evidence of tricuspid stenosis.   Aortic Valve: The aortic valve has an indeterminant number of cusps. There  is moderate calcification of the aortic valve. There is moderate  thickening of the aortic valve. There is moderate aortic valve annular  calcification. Aortic valve regurgitation  is not visualized. Severe aortic stenosis is present. Aortic valve mean  gradient measures 45.7 mmHg. Aortic valve peak gradient measures 78.5  mmHg. Aortic valve area, by VTI measures 0.64 cm.   Pulmonic Valve: The pulmonic valve was not well visualized. Pulmonic valve  regurgitation is not visualized. No evidence of pulmonic stenosis.   Aorta: The aortic root is normal in size and structure.   Venous: The inferior vena cava is normal in size with greater than 50%  respiratory variability, suggesting right atrial pressure of 3 mmHg.   IAS/Shunts: The  interatrial septum was not well visualized.     LEFT VENTRICLE  PLAX 2D    LVIDd:         4.20 cm   Diastology  LVIDs:         2.80 cm   LV e' medial:    5.11 cm/s  LV PW:         1.00 cm   LV E/e' medial:  20.0  LV IVS:        1.00 cm   LV e' lateral:   8.59 cm/s  LVOT diam:     2.00 cm   LV E/e' lateral: 11.9  LV SV:         71  LV SV Index:   38  LVOT Area:     3.14 cm                             3D Volume EF:                           3D EF:        55 %                           LV EDV:       140 ml                           LV ESV:       64 ml                           LV SV:        77 ml   RIGHT VENTRICLE  RV S prime:     12.00 cm/s  TAPSE (M-mode): 2.0 cm   LEFT ATRIUM              Index        RIGHT ATRIUM           Index  LA diam:        4.50 cm  2.39 cm/m   RA Area:     16.00 cm  LA Vol (A2C):   116.0 ml 61.67 ml/m  RA Volume:   40.10 ml  21.32 ml/m  LA Vol (A4C):   75.4 ml  40.09 ml/m  LA Biplane Vol: 95.2 ml  50.62 ml/m   AORTIC VALVE                     PULMONIC VALVE  AV Area (Vmax):    0.76 cm      PV Vmax:       3.56 m/s  AV Area (Vmean):   0.69 cm      PV Peak grad:  50.7 mmHg  AV Area (VTI):     0.64 cm  AV Vmax:           443.00 cm/s  AV Vmean:          317.333 cm/s  AV VTI:            1.110 m  AV Peak Grad:      78.5 mmHg  AV Mean Grad:      45.7 mmHg  LVOT Vmax:         107.00 cm/s  LVOT Vmean:        69.800 cm/s  LVOT VTI:            0.225 m  LVOT/AV VTI ratio: 0.20    AORTA  Ao Root diam: 3.00 cm  Ao Asc diam:  3.10 cm   MITRAL VALVE                TRICUSPID VALVE  MV Area (PHT): 2.84 cm     TR Peak grad:   30.5 mmHg  MV Peak grad:  8.0 mmHg     TR Vmax:        276.00 cm/s  MV Mean grad:  3.0 mmHg  MV Vmax:       1.41 m/s     SHUNTS  MV Vmean:      80.7 cm/s    Systemic VTI:  0.22 m  MV Decel Time: 267 msec     Systemic Diam: 2.00 cm  MV E velocity: 102.00 cm/s  MV A velocity: 144.00 cm/s  MV E/A ratio:  0.71   Recent  Labs: 11/23/2021: ALT 11; Magnesium 2.2; TSH 1.557 11/24/2021: BUN 11; Creatinine, Ser 0.72; Hemoglobin 10.3; Platelets 155; Potassium 4.1; Sodium 141     Wt Readings from Last 3 Encounters:  06/23/22 78.2 kg  05/16/22 78.7 kg  11/24/21 85 kg    Assessment and Plan:   1. Severe Aortic Valve Stenosis: She has severe, stage D aortic valve stenosis. NYHA class 2 symptoms. I have personally reviewed the echo images. . The aortic valve is thickened, calcified with limited leaflet mobility. I think she would benefit from AVR. Given advanced age, she is not a good candidate for conventional AVR by surgical approach. I think shemay be a good candidate for TAVR.   I have reviewed the natural history of aortic stenosis with the patient and their family members  who are present today. We have discussed the limitations of medical therapy and the poor prognosis associated with symptomatic aortic stenosis. We have reviewed potential treatment options, including palliative medical therapy, conventional surgical aortic valve replacement, and transcatheter aortic valve replacement. We discussed treatment options in the context of the patient's specific comorbid medical conditions.   She would like to proceed with planning for TAVR. I will arrange a right and left heart catheterization at [facility] 07/07/22. Risks and benefits of the cath procedure and the valve procedure are reviewed with the patient. After the cath, she will have a cardiac CT, CTA of the chest/abdomen and pelvis and will then be referred to see one of the CT surgeons on our TAVR team.   BMET and CBC today.     Labs/ tests ordered today include:   Orders Placed This Encounter  Procedures   CBC   Basic metabolic panel   EKG 12-Lead   Disposition:   F/U will be arranged with the valve team   Signed, Jaiven Graveline, MD, FACC 06/23/2022 1:39 PM     [facility] [address], [place], [place]  [address] Phone: [phone]; Fax: [phone]     

## 2022-06-23 NOTE — Progress Notes (Addendum)
[facility] Consult Note  Chief Complaint  Patient presents with   New Patient (Initial Visit)    Severe aortic stenosis   History of Present Illness: 82 yo female with history of anxiety, depression, Crohn's disease, prior DVT, HTN, HLD and severe aortic stenosis who is here today as a new consult, referred by Dr. Eden Emms, for further discussion regarding her aortic stenosis and possible TAVR. She has been followed by Dr. Eden Emms for moderate aortic stenosis. Echo March 2024 with LVEF=60-65%. Severe aortic stenosis with calcified, thickened leaflets, mean gradient 45.7 mmHg, peak gradient 78.5 mmHg, AVA 0.64 cm2, DI 0.20. She has chronic back pain and uses a walker when she is out of the house. She is no longer on anti-coagulation for DVT.   She tells me today that she has progressive dyspnea and fatigue. No chest pain, dizziness or LE edema.  She lives in [place], [place] with her daughter. She no longer works. 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 He passed away last year. She has full dentures.   Primary Care Physician: [name], MD Primary Cardiologist: Eden Emms Referring Cardiologist: Eden Emms  Past Medical History:  Diagnosis Date   Anxiety    Arthritis    Crohn disease (HCC)    Depression    HTN (hypertension)    Hyperlipidemia    Neuropathy    Severe aortic stenosis    Varicose veins of bilateral lower extremities with other complications     Past Surgical History:  Procedure Laterality Date   BOWEL RESECTION     HIP ARTHROPLASTY Left 06/06/2016   Procedure: ARTHROPLASTY BIPOLAR HIP (HEMIARTHROPLASTY);  Surgeon: Vickki Hearing, MD;  Location: [facility];  Service: Orthopedics;  Laterality: Left;    Current Outpatient Medications  Medication Sig Dispense Refill   acetaminophen (TYLENOL) 650 MG CR tablet Take 650 mg by mouth every 8 (eight) hours as needed for pain.     albuterol (VENTOLIN HFA) 108 (90 Base) MCG/ACT inhaler Inhale 2 puffs into the  lungs every 4 (four) hours as needed.     ALPRAZolam (XANAX) 0.25 MG tablet Take 0.25 mg by mouth 2 (two) times daily.     Ascorbic Acid (VITAMIN C) 1000 MG tablet Take 1,000 mg by mouth.      b complex vitamins tablet Take 1 tablet by mouth daily.     CALCIUM PO Take 1 tablet by mouth daily.     COLLAGEN PO Take 1 tablet by mouth daily.     furosemide (LASIX) 20 MG tablet Take 20 mg by mouth daily.     gabapentin (NEURONTIN) 100 MG capsule Take 1 capsule (100 mg total) by mouth 3 (three) times daily. 90 capsule 1   mirabegron ER (MYRBETRIQ) 50 MG TB24 tablet TAKE (1) TABLET BY MOUTH AT BEDTIME. 30 tablet 11   olmesartan (BENICAR) 40 MG tablet Take 40 mg by mouth daily.     pantoprazole (PROTONIX) 40 MG tablet Take 40 mg by mouth daily.     predniSONE (DELTASONE) 10 MG tablet Take 40 mg by mouth daily x1 week; then 30 mg by mouth daily x1 week; then 20 mg by mouth daily x1 week; then 10 mg by mouth daily x1 week and stop prednisone. 70 tablet 0   Psyllium (METAMUCIL PO) Take 1 Package by mouth daily as needed (constipation).     sertraline (ZOLOFT) 100 MG tablet Take 100 mg by mouth daily.     simvastatin (ZOCOR) 10 MG  tablet Take 10 mg by mouth at bedtime.     sulfaSALAzine (AZULFIDINE) 500 MG tablet Take 1 tablet (500 mg total) by mouth 2 (two) times daily. 60 tablet 2   vitamin E 400 UNIT capsule Take 400 Units by mouth daily.     No current facility-administered medications for this visit.    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  Tobacco Use   Smoking status: Former    Packs/day: 0.50    Years: 20.00    Additional pack years: 0.00    Total pack years: 10.00    Types:  Cigarettes   Smokeless tobacco: Never  Vaping Use   Vaping Use: Never used  Substance and Sexual Activity   Alcohol use: No   Drug use: No   Sexual activity: Not Currently    Birth control/protection: Post-menopausal  Other Topics Concern   Not on file  Social History Narrative   Not on file   Social Determinants of Health   Financial Resource Strain: Not on file  Food Insecurity: No Food Insecurity (11/23/2021)   Hunger Vital Sign    Worried About Running Out of Food in the Last Year: Never true    Ran Out of Food in the Last Year: Never true  Transportation Needs: No Transportation Needs (11/23/2021)   PRAPARE - Administrator, Civil Service (Medical): No    Lack of Transportation (Non-Medical): No  Physical Activity: Not on file  Stress: Not on file  Social Connections: Not on file  Intimate Partner Violence: Not At Risk (11/23/2021)   Humiliation, Afraid, Rape, and Kick questionnaire    Fear of Current or Ex-Partner: No    Emotionally Abused: No    Physically Abused: No    Sexually Abused: No    Family History  Problem Relation Age of Onset   Stroke Mother    Heart attack Father    Breast cancer Sister    Breast cancer Sister    Bipolar disorder Daughter    Other Son        disabled    Review of Systems:  As stated in the HPI and otherwise negative.   BP 128/60 (BP Location: Right Arm, Patient Position: Sitting, Cuff Size: Large)   Pulse 71   Ht 5\' 3"  (1.6 m)   Wt 78.2 kg   SpO2 98%   BMI 30.54 kg/m   Physical Examination: General: Well developed, well nourished, NAD  HEENT: OP clear, mucus membranes moist  SKIN: warm, dry. No rashes. Neuro: No focal deficits  Musculoskeletal: Muscle strength 5/5 all ext  Psychiatric: Mood and affect normal  Neck: No JVD, no carotid bruits, no thyromegaly, no lymphadenopathy.  Lungs:Clear bilaterally, no wheezes, rhonci, crackles Cardiovascular: Regular rate and rhythm. Loud, harsh, late peaking systolic  murmur.  Abdomen:Soft. Bowel sounds present. Non-tender.  Extremities: No lower extremity edema. Pulses are 2 + in the bilateral DP/PT.  EKG:  EKG is ordered today. The ekg ordered today demonstrates Sinus, PACs. Non-specific T wave abn  Echo March 2024:  1. Left ventricular ejection fraction, by estimation, is 60 to 65%.  The  left ventricle has normal function. The left ventricle has no regional  wall motion abnormalities. Left ventricular diastolic parameters are  consistent with Grade I diastolic  dysfunction (impaired relaxation). Elevated left atrial pressure.   2. Right ventricular systolic function is normal. The right ventricular  size is normal. There is mildly elevated pulmonary artery systolic  pressure.   3. Left atrial size was severely dilated.   4. The mitral valve is normal in structure. Trivial mitral valve  regurgitation. No evidence of mitral stenosis.   5. The tricuspid valve is abnormal.   6. The aortic valve has an indeterminant number of cusps. There is  moderate calcification of the aortic valve. There is moderate thickening  of the aortic valve. Aortic valve regurgitation is not visualized. Severe  aortic valve stenosis. Aortic valve  mean gradient measures 45.7 mmHg. Aortic valve peak gradient measures 78.5  mmHg. Aortic valve area, by VTI measures 0.64 cm.   7. The inferior vena cava is normal in size with greater than 50%  respiratory variability, suggesting right atrial pressure of 3 mmHg.   FINDINGS   Left Ventricle: Left ventricular ejection fraction, by estimation, is 60  to 65%. The left ventricle has normal function. The left ventricle has no  regional wall motion abnormalities. Definity contrast agent was given IV  to delineate the left ventricular   endocardial borders. Global longitudinal strain performed but not  reported based on interpreter judgement due to suboptimal tracking. The  left ventricular internal cavity size was normal in size.  There is no left  ventricular hypertrophy. Left  ventricular diastolic parameters are consistent with Grade I diastolic  dysfunction (impaired relaxation). Elevated left atrial pressure.   Right Ventricle: The right ventricular size is normal. Right vetricular  wall thickness was not well visualized. Right ventricular systolic  function is normal. There is mildly elevated pulmonary artery systolic  pressure. The tricuspid regurgitant velocity   is 2.76 m/s, and with an assumed right atrial pressure of 8 mmHg, the  estimated right ventricular systolic pressure is 38.5 mmHg.   Left Atrium: Left atrial size was severely dilated.   Right Atrium: Right atrial size was normal in size.   Pericardium: There is no evidence of pericardial effusion.   Mitral Valve: The mitral valve is normal in structure. Trivial mitral  valve regurgitation. No evidence of mitral valve stenosis. MV peak  gradient, 8.0 mmHg. The mean mitral valve gradient is 3.0 mmHg.   Tricuspid Valve: The tricuspid valve is abnormal. Tricuspid valve  regurgitation is mild . No evidence of tricuspid stenosis.   Aortic Valve: The aortic valve has an indeterminant number of cusps. There  is moderate calcification of the aortic valve. There is moderate  thickening of the aortic valve. There is moderate aortic valve annular  calcification. Aortic valve regurgitation  is not visualized. Severe aortic stenosis is present. Aortic valve mean  gradient measures 45.7 mmHg. Aortic valve peak gradient measures 78.5  mmHg. Aortic valve area, by VTI measures 0.64 cm.   Pulmonic Valve: The pulmonic valve was not well visualized. Pulmonic valve  regurgitation is not visualized. No evidence of pulmonic stenosis.   Aorta: The aortic root is normal in size and structure.   Venous: The inferior vena cava is normal in size with greater than 50%  respiratory variability, suggesting right atrial pressure of 3 mmHg.   IAS/Shunts: The  interatrial septum was not well visualized.     LEFT VENTRICLE  PLAX 2D  LVIDd:         4.20 cm   Diastology  LVIDs:         2.80 cm   LV e' medial:    5.11 cm/s  LV PW:         1.00 cm   LV E/e' medial:  20.0  LV IVS:        1.00 cm   LV e' lateral:   8.59 cm/s  LVOT diam:     2.00 cm   LV E/e' lateral: 11.9  LV SV:         71  LV SV Index:   38  LVOT Area:     3.14 cm                             3D Volume EF:                           3D EF:        55 %                           LV EDV:       140 ml                           LV ESV:       64 ml                           LV SV:        77 ml   RIGHT VENTRICLE  RV S prime:     12.00 cm/s  TAPSE (M-mode): 2.0 cm   LEFT ATRIUM              Index        RIGHT ATRIUM           Index  LA diam:        4.50 cm  2.39 cm/m   RA Area:     16.00 cm  LA Vol (A2C):   116.0 ml 61.67 ml/m  RA Volume:   40.10 ml  21.32 ml/m  LA Vol (A4C):   75.4 ml  40.09 ml/m  LA Biplane Vol: 95.2 ml  50.62 ml/m   AORTIC VALVE                     PULMONIC VALVE  AV Area (Vmax):    0.76 cm      PV Vmax:       3.56 m/s  AV Area (Vmean):   0.69 cm      PV Peak grad:  50.7 mmHg  AV Area (VTI):     0.64 cm  AV Vmax:           443.00 cm/s  AV Vmean:          317.333 cm/s  AV VTI:            1.110 m  AV Peak Grad:      78.5 mmHg  AV Mean Grad:      45.7 mmHg  LVOT Vmax:         107.00 cm/s  LVOT Vmean:        69.800 cm/s  LVOT VTI:  0.225 m  LVOT/AV VTI ratio: 0.20    AORTA  Ao Root diam: 3.00 cm  Ao Asc diam:  3.10 cm   MITRAL VALVE                TRICUSPID VALVE  MV Area (PHT): 2.84 cm     TR Peak grad:   30.5 mmHg  MV Peak grad:  8.0 mmHg     TR Vmax:        276.00 cm/s  MV Mean grad:  3.0 mmHg  MV Vmax:       1.41 m/s     SHUNTS  MV Vmean:      80.7 cm/s    Systemic VTI:  0.22 m  MV Decel Time: 267 msec     Systemic Diam: 2.00 cm  MV E velocity: 102.00 cm/s  MV A velocity: 144.00 cm/s  MV E/A ratio:  0.71   Recent  Labs: 11/23/2021: ALT 11; Magnesium 2.2; TSH 1.557 11/24/2021: BUN 11; Creatinine, Ser 0.72; Hemoglobin 10.3; Platelets 155; Potassium 4.1; Sodium 141     Wt Readings from Last 3 Encounters:  06/23/22 78.2 kg  05/16/22 78.7 kg  11/24/21 85 kg    Assessment and Plan:   1. Severe Aortic Valve Stenosis: She has severe, stage D aortic valve stenosis. NYHA class 2 symptoms. I have personally reviewed the echo images. . The aortic valve is thickened, calcified with limited leaflet mobility. I think she would benefit from AVR. Given advanced age, she is not a good candidate for conventional AVR by surgical approach. I think shemay be a good candidate for TAVR.   I have reviewed the natural history of aortic stenosis with the patient and their family members  who are present today. We have discussed the limitations of medical therapy and the poor prognosis associated with symptomatic aortic stenosis. We have reviewed potential treatment options, including palliative medical therapy, conventional surgical aortic valve replacement, and transcatheter aortic valve replacement. We discussed treatment options in the context of the patient's specific comorbid medical conditions.   She would like to proceed with planning for TAVR. I will arrange a right and left heart catheterization at [facility] 07/07/22. Risks and benefits of the cath procedure and the valve procedure are reviewed with the patient. After the cath, she will have a cardiac CT, CTA of the chest/abdomen and pelvis and will then be referred to see one of the CT surgeons on our TAVR team.   BMET and CBC today.     Labs/ tests ordered today include:   Orders Placed This Encounter  Procedures   CBC   Basic metabolic panel   EKG 12-Lead   Disposition:   F/U will be arranged with the valve team   Signed, Verne Carrow, MD, [facility] 06/23/2022 1:39 PM    [facility] [address] [place], [place], [place]  [address] Phone: [phone]; Fax: [phone]

## 2022-06-23 NOTE — Patient Instructions (Signed)
Medication Instructions:  No changes *If you need a refill on your cardiac medications before your next appointment, please call your pharmacy*   Lab Work: Today: bmet, cbc   Testing/Procedures: Your physician has requested that you have a cardiac catheterization. Cardiac catheterization is used to diagnose and/or treat various heart conditions. Doctors may recommend this procedure for a number of different reasons. The most common reason is to evaluate chest pain. Chest pain can be a symptom of coronary artery disease (CAD), and cardiac catheterization can show whether plaque is narrowing or blocking your heart's arteries. This procedure is also used to evaluate the valves, as well as measure the blood flow and oxygen levels in different parts of your heart. For further information please visit [email or web address] Please follow instruction sheet, as given.   Follow-Up: Per Structural Heart Team         Cardiac/Peripheral Catheterization   You are scheduled for a Cardiac Catheterization on Monday, May 13 with Dr. Verne Carrow.  1. Please arrive at the [facility] (Main Entrance A) at [facility]: [address] [place], [place] [address] at 7:00 AM (This time is TWO hour(s) before your procedure to ensure your preparation). Free valet parking service is available. You will check in at ADMITTING. The support person will be asked to wait in the waiting room.  It is OK to have someone drop you off and come back when you are ready to be discharged.        Special note: Every effort is made to have your procedure done on time. Please understand that emergencies sometimes delay scheduled procedures.  2. Diet: Do not eat solid foods after midnight.  You may have clear liquids until 5 AM the day of the procedure.  3. Labs: You will need to have blood drawn today. You do not need to be fasting.  4. Medication instructions in preparation for your procedure:  DO NOT TAKE  LASIX THE MORNING OF PROCEDURE.  Contrast Allergy: No  On the morning of your procedure, take Aspirin 81 mg and any morning medicines NOT listed above.  You may use sips of water.  5. Plan to go home the same day, you will only stay overnight if medically necessary. 6. You MUST have a responsible adult to drive you home. 7. An adult MUST be with you the first 24 hours after you arrive home. 8. Bring a current list of your medications, and the last time and date medication taken. 9. Bring ID and current insurance cards. 10.Please wear clothes that are easy to get on and off and wear slip-on shoes.  Thank you for allowing [place] to care for you!   -- [facility]

## 2022-06-24 LAB — BASIC METABOLIC PANEL
BUN/Creatinine Ratio: 20 (ref 12–28)
BUN: 17 mg/dL (ref 8–27)
CO2: 23 mmol/L (ref 20–29)
Calcium: 9.5 mg/dL (ref 8.7–10.3)
Chloride: 101 mmol/L (ref 96–106)
Creatinine, Ser: 0.83 mg/dL (ref 0.57–1.00)
Glucose: 96 mg/dL (ref 70–99)
Potassium: 4.8 mmol/L (ref 3.5–5.2)
Sodium: 143 mmol/L (ref 134–144)
eGFR: 72 mL/min/{1.73_m2} (ref >59)

## 2022-06-24 LAB — CBC
Hematocrit: 37.7 % (ref 34.0–46.6)
Hemoglobin: 12.5 g/dL (ref 11.1–15.9)
MCH: 28.5 pg (ref 26.6–33.0)
MCHC: 33.2 g/dL (ref 31.5–35.7)
MCV: 86 fL (ref 79–97)
Platelets: 168 10*3/uL (ref 150–450)
RBC: 4.39 x10E6/uL (ref 3.77–5.28)
RDW: 15.3 % (ref 11.7–15.4)
WBC: 7.2 10*3/uL (ref 3.4–10.8)

## 2022-07-03 ENCOUNTER — Telehealth: Payer: Self-pay | Admitting: *Deleted

## 2022-07-03 ENCOUNTER — Ambulatory Visit: Payer: Medicare Other | Admitting: Student

## 2022-07-03 NOTE — Telephone Encounter (Signed)
Cardiac Catheterization scheduled at [facility] for: Monday Jul 07, 2022 9 AM Arrival time [facility] Main Entrance A at: 7 AM  Nothing to eat after midnight prior to procedure, clear liquids until 5 AM day of procedure.  Medication instructions: -Hold:  Lasix-AM of procedure -Other usual morning medications can be taken with sips of water including aspirin 81 mg.  Confirmed patient has responsible adult to drive home post procedure and be with patient first 24 hours after arriving home.  Plan to go home the same day, you will only stay overnight if medically necessary.  Reviewed procedure instructions with patient.

## 2022-07-07 ENCOUNTER — Ambulatory Visit (HOSPITAL_COMMUNITY)
Admission: RE | Admit: 2022-07-07 | Discharge: 2022-07-07 | Disposition: A | Discharge status: Home / Self Care | Payer: Medicare Other | Attending: Cardiovascular Disease | Admitting: Cardiovascular Disease

## 2022-07-07 ENCOUNTER — Other Ambulatory Visit: Payer: Self-pay

## 2022-07-07 ENCOUNTER — Encounter (HOSPITAL_COMMUNITY)
Admission: RE | Disposition: A | Discharge status: Home / Self Care | Payer: Self-pay | Source: Home / Self Care | Attending: Cardiovascular Disease

## 2022-07-07 DIAGNOSIS — I2510 Atherosclerotic heart disease of native coronary artery without angina pectoris: Secondary | ICD-10-CM | POA: Diagnosis not present

## 2022-07-07 DIAGNOSIS — Z86718 Personal history of other venous thrombosis and embolism: Secondary | ICD-10-CM | POA: Diagnosis not present

## 2022-07-07 DIAGNOSIS — Z87891 Personal history of nicotine dependence: Secondary | ICD-10-CM | POA: Insufficient documentation

## 2022-07-07 DIAGNOSIS — E785 Hyperlipidemia, unspecified: Secondary | ICD-10-CM | POA: Diagnosis not present

## 2022-07-07 DIAGNOSIS — I350 Nonrheumatic aortic (valve) stenosis: Secondary | ICD-10-CM | POA: Diagnosis not present

## 2022-07-07 DIAGNOSIS — Z8249 Family history of ischemic heart disease and other diseases of the circulatory system: Secondary | ICD-10-CM | POA: Insufficient documentation

## 2022-07-07 DIAGNOSIS — I10 Essential (primary) hypertension: Secondary | ICD-10-CM | POA: Insufficient documentation

## 2022-07-07 HISTORY — PX: RIGHT HEART CATH AND CORONARY ANGIOGRAPHY: CATH118264

## 2022-07-07 LAB — POCT I-STAT EG7
Acid-Base Excess: 2 mmol/L (ref 0.0–2.0)
Bicarbonate: 29 mmol/L — ABNORMAL HIGH (ref 20.0–28.0)
Calcium, Ion: 1.25 mmol/L (ref 1.15–1.40)
HCT: 34 % — ABNORMAL LOW (ref 36.0–46.0)
Hemoglobin: 11.6 g/dL — ABNORMAL LOW (ref 12.0–15.0)
O2 Saturation: 74 %
Potassium: 3.8 mmol/L (ref 3.5–5.1)
Sodium: 142 mmol/L (ref 135–145)
TCO2: 31 mmol/L (ref 22–32)
pCO2, Ven: 57.6 mmHg (ref 44–60)
pH, Ven: 7.311 (ref 7.25–7.43)
pO2, Ven: 44 mmHg (ref 32–45)

## 2022-07-07 LAB — POCT I-STAT 7, (LYTES, BLD GAS, ICA,H+H)
Acid-Base Excess: 0 mmol/L (ref 0.0–2.0)
Bicarbonate: 26.6 mmol/L (ref 20.0–28.0)
Calcium, Ion: 1.1 mmol/L — ABNORMAL LOW (ref 1.15–1.40)
HCT: 32 % — ABNORMAL LOW (ref 36.0–46.0)
Hemoglobin: 10.9 g/dL — ABNORMAL LOW (ref 12.0–15.0)
O2 Saturation: 94 %
Potassium: 3.5 mmol/L (ref 3.5–5.1)
Sodium: 144 mmol/L (ref 135–145)
TCO2: 28 mmol/L (ref 22–32)
pCO2 arterial: 54.1 mmHg — ABNORMAL HIGH (ref 32–48)
pH, Arterial: 7.3 — ABNORMAL LOW (ref 7.35–7.45)
pO2, Arterial: 80 mmHg — ABNORMAL LOW (ref 83–108)

## 2022-07-07 SURGERY — RIGHT HEART CATH AND CORONARY ANGIOGRAPHY
Anesthesia: LOCAL

## 2022-07-07 MED ORDER — LIDOCAINE HCL (PF) 1 % IJ SOLN
INTRAMUSCULAR | Status: DC | PRN
  Administered 2022-07-07 (×2): 2 mL

## 2022-07-07 MED ORDER — HEPARIN (PORCINE) IN NACL 1000-0.9 UT/500ML-% IV SOLN
INTRAVENOUS | Status: DC | PRN
  Administered 2022-07-07 (×2): 500 mL

## 2022-07-07 MED ORDER — METOPROLOL TARTRATE 50 MG PO TABS: ORAL_TABLET | ORAL | 0 refills | Status: DC

## 2022-07-07 MED ORDER — IOHEXOL 350 MG/ML SOLN
INTRAVENOUS | Status: DC | PRN
  Administered 2022-07-07: 55 mL

## 2022-07-07 MED ORDER — FENTANYL CITRATE (PF) 100 MCG/2ML IJ SOLN
INTRAMUSCULAR | Status: AC
  Filled 2022-07-07: qty 2

## 2022-07-07 MED ORDER — VERAPAMIL HCL 2.5 MG/ML IV SOLN
INTRAVENOUS | Status: DC | PRN
  Administered 2022-07-07: 10 mL via INTRA_ARTERIAL

## 2022-07-07 MED ORDER — MIDAZOLAM HCL 2 MG/2ML IJ SOLN
INTRAMUSCULAR | Status: DC | PRN
  Administered 2022-07-07: 1 mg via INTRAVENOUS

## 2022-07-07 MED ORDER — SODIUM CHLORIDE 0.9% FLUSH: 3.0000 mL | INTRAVENOUS | Status: DC | PRN

## 2022-07-07 MED ORDER — SODIUM CHLORIDE 0.9 % WEIGHT BASED INFUSION: 3.0000 mL/kg/h | INTRAVENOUS | Status: AC

## 2022-07-07 MED ORDER — HEPARIN SODIUM (PORCINE) 1000 UNIT/ML IJ SOLN
INTRAMUSCULAR | Status: AC
  Filled 2022-07-07: qty 10

## 2022-07-07 MED ORDER — FENTANYL CITRATE (PF) 100 MCG/2ML IJ SOLN
INTRAMUSCULAR | Status: DC | PRN
  Administered 2022-07-07: 25 ug via INTRAVENOUS

## 2022-07-07 MED ORDER — LIDOCAINE HCL (PF) 1 % IJ SOLN
INTRAMUSCULAR | Status: AC
  Filled 2022-07-07: qty 30

## 2022-07-07 MED ORDER — VERAPAMIL HCL 2.5 MG/ML IV SOLN
INTRAVENOUS | Status: AC
  Filled 2022-07-07: qty 2

## 2022-07-07 MED ORDER — ACETAMINOPHEN 325 MG PO TABS: 650.0000 mg | ORAL_TABLET | ORAL | Status: DC | PRN

## 2022-07-07 MED ORDER — SODIUM CHLORIDE 0.9 % IV SOLN: INTRAVENOUS | Status: DC

## 2022-07-07 MED ORDER — ASPIRIN 81 MG PO CHEW: 81.0000 mg | CHEWABLE_TABLET | ORAL | Status: DC

## 2022-07-07 MED ORDER — SODIUM CHLORIDE 0.9% FLUSH: 3.0000 mL | Freq: Two times a day (BID) | INTRAVENOUS | Status: DC

## 2022-07-07 MED ORDER — SODIUM CHLORIDE 0.9 % IV SOLN: 250.0000 mL | INTRAVENOUS | Status: DC | PRN

## 2022-07-07 MED ORDER — SODIUM CHLORIDE 0.9 % WEIGHT BASED INFUSION: 1.0000 mL/kg/h | INTRAVENOUS | Status: DC

## 2022-07-07 MED ORDER — MIDAZOLAM HCL 2 MG/2ML IJ SOLN
INTRAMUSCULAR | Status: AC
  Filled 2022-07-07: qty 2

## 2022-07-07 MED ORDER — ONDANSETRON HCL 4 MG/2ML IJ SOLN: 4.0000 mg | Freq: Four times a day (QID) | INTRAMUSCULAR | Status: DC | PRN

## 2022-07-07 MED ORDER — HEPARIN SODIUM (PORCINE) 1000 UNIT/ML IJ SOLN
INTRAMUSCULAR | Status: DC | PRN
  Administered 2022-07-07: 4000 [IU] via INTRAVENOUS

## 2022-07-07 MED ORDER — LABETALOL HCL 5 MG/ML IV SOLN
10.0000 mg | INTRAVENOUS | Status: DC | PRN
  Administered 2022-07-07: 10 mg via INTRAVENOUS
  Filled 2022-07-07: qty 4

## 2022-07-07 MED ORDER — HYDRALAZINE HCL 20 MG/ML IJ SOLN: 10.0000 mg | INTRAMUSCULAR | Status: DC | PRN

## 2022-07-07 SURGICAL SUPPLY — 13 items
CATH BALLN WEDGE 5F 110CM (CATHETERS) IMPLANT
CATH INFINITI 5FR MULTPACK ANG (CATHETERS) IMPLANT
DEVICE RAD COMP TR BAND LRG (VASCULAR PRODUCTS) IMPLANT
GLIDESHEATH SLEND SS 6F .021 (SHEATH) IMPLANT
GUIDEWIRE .025 260CM (WIRE) IMPLANT
GUIDEWIRE INQWIRE 1.5J.035X260 (WIRE) IMPLANT
INQWIRE 1.5J .035X260CM (WIRE) ×1
KIT HEART LEFT (KITS) ×1 IMPLANT
PACK CARDIAC CATHETERIZATION (CUSTOM PROCEDURE TRAY) ×1 IMPLANT
SHEATH GLIDE SLENDER 4/5FR (SHEATH) IMPLANT
TRANSDUCER W/STOPCOCK (MISCELLANEOUS) ×1 IMPLANT
TUBING CIL FLEX 10 FLL-RA (TUBING) ×1 IMPLANT
WIRE HI TORQ VERSACORE-J 145CM (WIRE) IMPLANT

## 2022-07-07 NOTE — Progress Notes (Signed)
TR BAND REMOVAL  LOCATION:    left radial  DEFLATED PER PROTOCOL:    Yes.    TIME BAND OFF / DRESSING APPLIED:    1150 gauze dressing applied   SITE UPON ARRIVAL:    Level 0  SITE AFTER BAND REMOVAL:    Level 0  CIRCULATION SENSATION AND MOVEMENT:    Within Normal Limits   Yes.    COMMENTS:   no issues noted

## 2022-07-07 NOTE — Discharge Instructions (Signed)

## 2022-07-07 NOTE — Interval H&P Note (Signed)
History and Physical Interval Note:  07/07/2022 8:33 AM  Barbara Lucero  has presented today for surgery, with the diagnosis of aortic stenosis.  The various methods of treatment have been discussed with the patient and family. After consideration of risks, benefits and other options for treatment, the patient has consented to  Procedure(s): RIGHT/LEFT HEART CATH AND CORONARY ANGIOGRAPHY (N/A) as a surgical intervention.  The patient's history has been reviewed, patient examined, no change in status, stable for surgery.  I have reviewed the patient's chart and labs.  Questions were answered to the patient's satisfaction.    Cath Lab Visit (complete for each Cath Lab visit)  Clinical Evaluation Leading to the Procedure:   ACS: No.  Non-ACS:    Anginal Classification: CCS II  Anti-ischemic medical therapy: No Therapy  Non-Invasive Test Results: No non-invasive testing performed  Prior CABG: No previous CABG        Verne Carrow

## 2022-07-08 ENCOUNTER — Encounter (HOSPITAL_COMMUNITY): Payer: Self-pay | Admitting: Cardiovascular Disease

## 2022-07-08 ENCOUNTER — Telehealth: Payer: Self-pay | Admitting: Cardiology

## 2022-07-08 NOTE — Telephone Encounter (Signed)
    Patient called this afternoon to report that she has been experiencing numbness in her left thumb since discharge yesterday following her outpatient heart catheterization. Numbness has improved during the day today but patient wanted clarification on if this was an issue. Per patient, there is NO discoloration of fingers and all are warm to touch. I asked her to compress her thumb and she reports rapid capillary refill. Given that there doesn't appear to be circular compromise, I advised patient to continue to monitor symptoms closely and call the office tomorrow if she still has numbness. Suspect that this is nerve irritation secondary to TR band compression. Reviewed with patient that if she develops pain in her finger or notices purple or white discoloration, she should proceed to the nearest emergency department. Patient agreeable to this plan and voiced understanding.  Perlie Gold, PA-C

## 2022-07-09 ENCOUNTER — Ambulatory Visit (HOSPITAL_COMMUNITY): Payer: Medicare Other

## 2022-07-10 ENCOUNTER — Ambulatory Visit (HOSPITAL_COMMUNITY)
Admission: RE | Admit: 2022-07-10 | Discharge: 2022-07-10 | Disposition: A | Discharge status: Home / Self Care | Payer: Medicare Other | Source: Ambulatory Visit | Attending: Internal Medicine | Admitting: Internal Medicine

## 2022-07-10 DIAGNOSIS — I350 Nonrheumatic aortic (valve) stenosis: Secondary | ICD-10-CM | POA: Insufficient documentation

## 2022-07-10 MED ORDER — IOHEXOL 350 MG/ML SOLN
95.0000 mL | Freq: Once | INTRAVENOUS | Status: AC | PRN
  Administered 2022-07-10: 95 mL via INTRAVENOUS

## 2022-07-14 ENCOUNTER — Encounter: Payer: Self-pay | Admitting: Thoracic Surgery (Cardiothoracic Vascular Surgery)

## 2022-07-14 ENCOUNTER — Institutional Professional Consult (permissible substitution): Payer: Medicare Other | Admitting: Thoracic Surgery (Cardiothoracic Vascular Surgery)

## 2022-07-14 VITALS — BP 148/75 | HR 86 | Resp 18 | Ht 63.0 in | Wt 172.0 lb

## 2022-07-14 DIAGNOSIS — I350 Nonrheumatic aortic (valve) stenosis: Secondary | ICD-10-CM

## 2022-07-14 NOTE — Progress Notes (Signed)
[address]       [place] [employer] [address]             [phone]           SHAKIERRA STRIKE [facility] Medical Record #[id number] Date of Birth: [birth year]  Wendall Stade, MD [name], MD  Chief Complaint: Increasing Dyspnea and fatigue   History of Present Illness:     82 yo female who has been followed by Dr Eden Emms for AS. Pt has had serial echos and with her complaints of increasing DOE and fatigue, the most recent echo had normal LV function with severe calcific AS with a mean gradient of and pk gradient of . She uses a walker outside of the house and lives with her daughter who says her mom is very active going out to prayer mtgs and church and shopping. She was felt secondary to her symptomatic AS to benefit from AVR and underwent cath without CAD and CTA which has acceptable anatomy for TAVR. She tells me we have to use the right groin since her prayer group had sent her a diagram of her body and feels that God has chosen the right side for access.      Past Medical History:  Diagnosis Date   Anxiety    Arthritis    Crohn disease (HCC)    Depression    HTN (hypertension)    Hyperlipidemia    Neuropathy    Severe aortic stenosis    Varicose veins of bilateral lower extremities with other complications     Past Surgical History:  Procedure Laterality Date   BOWEL RESECTION     HIP ARTHROPLASTY Left 06/06/2016   Procedure: ARTHROPLASTY BIPOLAR HIP (HEMIARTHROPLASTY);  Surgeon: Vickki Hearing, MD;  Location: [facility];  Service: Orthopedics;  Laterality: Left;   RIGHT HEART CATH AND CORONARY ANGIOGRAPHY N/A 07/07/2022   Procedure: RIGHT HEART CATH AND CORONARY ANGIOGRAPHY;  Surgeon: Kathleene Hazel, MD;  Location: [facility];  Service: Cardiovascular;  Laterality: N/A;    Social History   Tobacco Use  Smoking Status Former   Packs/day: 0.50   Years: 20.00   Additional pack years: 0.00   Total pack years: 10.00    Types: Cigarettes  Smokeless Tobacco Never    Social History   Substance and Sexual Activity  Alcohol Use No    Social History   Socioeconomic History   Marital status: Widowed    Spouse name: Not on file   Number of children: 2   Years of education: Not on file   Highest education level: Not on file  Occupational History   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  Tobacco Use   Smoking status: Former    Packs/day: 0.50    Years: 20.00    Additional pack years: 0.00    Total pack years: 10.00    Types: Cigarettes   Smokeless tobacco: Never  Vaping Use   Vaping Use: Never used  Substance and Sexual Activity   Alcohol use: No   Drug use: No   Sexual activity: Not Currently    Birth control/protection: Post-menopausal  Other Topics Concern   Not on file  Social History Narrative   Not on file   Social Determinants of Health   Financial Resource Strain: Not on file  Food Insecurity: No Food Insecurity (11/23/2021)   Hunger Vital Sign    Worried About Running Out of  Food in the Last Year: Never true    Ran Out of Food in the Last Year: Never true  Transportation Needs: No Transportation Needs (11/23/2021)   PRAPARE - Administrator, Civil Service (Medical): No    Lack of Transportation (Non-Medical): No  Physical Activity: Not on file  Stress: Not on file  Social Connections: Not on file  Intimate Partner Violence: Not At Risk (11/23/2021)   Humiliation, Afraid, Rape, and Kick questionnaire    Fear of Current or Ex-Partner: No    Emotionally Abused: No    Physically Abused: No    Sexually Abused: No    Allergies  Allergen Reactions   Coconut (Cocos Nucifera) Hives   Latex Hives    Current Outpatient Medications  Medication Sig Dispense Refill   metoprolol tartrate (LOPRESSOR) 50 MG tablet Take one tablet by mouth as directed prior to CT scan 1 tablet 0   acetaminophen (TYLENOL) 500 MG tablet Take 1,200 mg by mouth 2 (two) times daily.      albuterol (VENTOLIN HFA) 108 (90 Base) MCG/ACT inhaler Inhale 2 puffs into the lungs every 4 (four) hours as needed for shortness of breath or wheezing.     ALPRAZolam (XANAX) 0.25 MG tablet Take 0.25 mg by mouth at bedtime.     Ascorbic Acid (VITAMIN C) 1000 MG tablet Take 1,000 mg by mouth.      aspirin EC 81 MG tablet Take 81 mg by mouth daily. Swallow whole.     b complex vitamins tablet Take 1 tablet by mouth daily.     CALCIUM PO Take 600 mg by mouth daily.     Cholecalciferol (VITAMIN D3) 50 MCG (2000 UT) capsule Take 2,000 Units by mouth daily.     cyanocobalamin (VITAMIN B12) 1000 MCG tablet Take 1,000 mcg by mouth daily.     furosemide (LASIX) 20 MG tablet Take 20 mg by mouth once a week.     gabapentin (NEURONTIN) 100 MG capsule Take 1 capsule (100 mg total) by mouth 3 (three) times daily. (Patient taking differently: Take 100 mg by mouth 2 (two) times daily.) 90 capsule 1   mirabegron ER (MYRBETRIQ) 50 MG TB24 tablet TAKE (1) TABLET BY MOUTH AT BEDTIME. 30 tablet 11   Multiple Vitamins-Minerals (MULTIVITAMIN WITH MINERALS) tablet Take 1 tablet by mouth daily.     olmesartan (BENICAR) 40 MG tablet Take 40 mg by mouth daily.     pantoprazole (PROTONIX) 40 MG tablet Take 40 mg by mouth daily.     Psyllium (METAMUCIL PO) Take 1 Package by mouth daily as needed (constipation).     sertraline (ZOLOFT) 100 MG tablet Take 100 mg by mouth at bedtime.     simvastatin (ZOCOR) 10 MG tablet Take 10 mg by mouth in the morning.     sulfaSALAzine (AZULFIDINE) 500 MG tablet Take 1 tablet (500 mg total) by mouth 2 (two) times daily. 60 tablet 2   vitamin E 400 UNIT capsule Take 400 Units by mouth daily.     No current facility-administered medications for this visit.     Family History  Problem Relation Age of Onset   Stroke Mother    Heart attack Father    Breast cancer Sister    Breast cancer Sister    Bipolar disorder Daughter    Other Son        disabled       Physical  Exam: Dentures Lungs: clear Cardiac: RR with harsh 5/6 sem Ext:  no edema Neuro: intact     Diagnostic Studies & Laboratory data: I have personally reviewed the following studies and agree with the findings   TTE (04/2022) IMPRESSIONS     1. Left ventricular ejection fraction, by estimation, is 60 to 65%. The  left ventricle has normal function. The left ventricle has no regional  wall motion abnormalities. Left ventricular diastolic parameters are  consistent with Grade I diastolic  dysfunction (impaired relaxation). Elevated left atrial pressure.   2. Right ventricular systolic function is normal. The right ventricular  size is normal. There is mildly elevated pulmonary artery systolic  pressure.   3. Left atrial size was severely dilated.   4. The mitral valve is normal in structure. Trivial mitral valve  regurgitation. No evidence of mitral stenosis.   5. The tricuspid valve is abnormal.   6. The aortic valve has an indeterminant number of cusps. There is  moderate calcification of the aortic valve. There is moderate thickening  of the aortic valve. Aortic valve regurgitation is not visualized. Severe  aortic valve stenosis. Aortic valve  mean gradient measures 45.7 mmHg. Aortic valve peak gradient measures 78.5  mmHg. Aortic valve area, by VTI measures 0.64 cm.   7. The inferior vena cava is normal in size with greater than 50%  respiratory variability, suggesting right atrial pressure of 3 mmHg.   FINDINGS   Left Ventricle: Left ventricular ejection fraction, by estimation, is 60  to 65%. The left ventricle has normal function. The left ventricle has no  regional wall motion abnormalities. Definity contrast agent was given IV  to delineate the left ventricular   endocardial borders. Global longitudinal strain performed but not  reported based on interpreter judgement due to suboptimal tracking. The  left ventricular internal cavity size was normal in size. There is no  left  ventricular hypertrophy. Left  ventricular diastolic parameters are consistent with Grade I diastolic  dysfunction (impaired relaxation). Elevated left atrial pressure.   Right Ventricle: The right ventricular size is normal. Right vetricular  wall thickness was not well visualized. Right ventricular systolic  function is normal. There is mildly elevated pulmonary artery systolic  pressure. The tricuspid regurgitant velocity   is 2.76 m/s, and with an assumed right atrial pressure of 8 mmHg, the  estimated right ventricular systolic pressure is 38.5 mmHg.   Left Atrium: Left atrial size was severely dilated.   Right Atrium: Right atrial size was normal in size.   Pericardium: There is no evidence of pericardial effusion.   Mitral Valve: The mitral valve is normal in structure. Trivial mitral  valve regurgitation. No evidence of mitral valve stenosis. MV peak  gradient, 8.0 mmHg. The mean mitral valve gradient is 3.0 mmHg.   Tricuspid Valve: The tricuspid valve is abnormal. Tricuspid valve  regurgitation is mild . No evidence of tricuspid stenosis.   Aortic Valve: The aortic valve has an indeterminant number of cusps. There  is moderate calcification of the aortic valve. There is moderate  thickening of the aortic valve. There is moderate aortic valve annular  calcification. Aortic valve regurgitation  is not visualized. Severe aortic stenosis is present. Aortic valve mean  gradient measures 45.7 mmHg. Aortic valve peak gradient measures 78.5  mmHg. Aortic valve area, by VTI measures 0.64 cm.   Pulmonic Valve: The pulmonic valve was not well visualized. Pulmonic valve  regurgitation is not visualized. No evidence of pulmonic stenosis.   Aorta: The aortic root is normal in size and structure.  Venous: The inferior vena cava is normal in size with greater than 50%  respiratory variability, suggesting right atrial pressure of 3 mmHg.   IAS/Shunts: The interatrial septum  was not well visualized.     LEFT VENTRICLE  PLAX 2D  LVIDd:         4.20 cm   Diastology  LVIDs:         2.80 cm   LV e' medial:    5.11 cm/s  LV PW:         1.00 cm   LV E/e' medial:  20.0  LV IVS:        1.00 cm   LV e' lateral:   8.59 cm/s  LVOT diam:     2.00 cm   LV E/e' lateral: 11.9  LV SV:         71  LV SV Index:   38  LVOT Area:     3.14 cm                             3D Volume EF:                           3D EF:        55 %                           LV EDV:       140 ml                           LV ESV:       64 ml                           LV SV:        77 ml   RIGHT VENTRICLE  RV S prime:     12.00 cm/s  TAPSE (M-mode): 2.0 cm   LEFT ATRIUM              Index        RIGHT ATRIUM           Index  LA diam:        4.50 cm  2.39 cm/m   RA Area:     16.00 cm  LA Vol (A2C):   116.0 ml 61.67 ml/m  RA Volume:   40.10 ml  21.32 ml/m  LA Vol (A4C):   75.4 ml  40.09 ml/m  LA Biplane Vol: 95.2 ml  50.62 ml/m   AORTIC VALVE                     PULMONIC VALVE  AV Area (Vmax):    0.76 cm      PV Vmax:       3.56 m/s  AV Area (Vmean):   0.69 cm      PV Peak grad:  50.7 mmHg  AV Area (VTI):     0.64 cm  AV Vmax:           443.00 cm/s  AV Vmean:          317.333 cm/s  AV VTI:            1.110 m  AV Peak Grad:      78.5 mmHg  AV  Mean Grad:      45.7 mmHg  LVOT Vmax:         107.00 cm/s  LVOT Vmean:        69.800 cm/s  LVOT VTI:          0.225 m  LVOT/AV VTI ratio: 0.20    AORTA  Ao Root diam: 3.00 cm  Ao Asc diam:  3.10 cm   MITRAL VALVE                TRICUSPID VALVE  MV Area (PHT): 2.84 cm     TR Peak grad:   30.5 mmHg  MV Peak grad:  8.0 mmHg     TR Vmax:        276.00 cm/s  MV Mean grad:  3.0 mmHg  MV Vmax:       1.41 m/s     SHUNTS  MV Vmean:      80.7 cm/s    Systemic VTI:  0.22 m  MV Decel Time: 267 msec     Systemic Diam: 2.00 cm  MV E velocity: 102.00 cm/s  MV A velocity: 144.00 cm/s  MV E/A ratio:  0.71   CATH (06/2022) Conclusion      Mid  Cx lesion is 30% stenosed.   Ost LAD to Prox LAD lesion is 20% stenosed.   Mid LAD lesion is 30% stenosed.   Prox RCA lesion is 50% stenosed.   Mild non-obstructive CAD Mild elevation right heart pressures PCWP 10 mmHg Mean PA pressure 33 Severe aortic stenosis by echo (I did not cross the aortic valve today).     Recent Radiology Findings:   CTA (06/2022) FINDINGS: Aortic Valve: Tri leaflet calcified with restricted leaflet motion Calcium score 1272   Aorta: No aneurysm Bovine arch moderate calcific atherosclerosis   Sino-tubular Junction: 24 mm   Ascending Thoracic Aorta: 29 mm   Aortic Arch: 24 mm   Descending Thoracic Aorta: 26 mm   Sinus of Valsalva Measurements:   Non-coronary: 26 mm   height 17.2 mm   Right - coronary: 25 mm  height 16.8   Left -   coronary: 25.7 mm  height 18.7 mm   Coronary Artery Height above Annulus:   Left Main: 14,4 mm above annulus   Right Coronary: 14.3 mm above annulus   Virtual Basal Annulus Measurements:   Maximum / Minimum Diameter: 21.1 mm x 20.2 mm Average diameter 21.4 mm   Perimeter: 68.1 mm   Area: 361 mm2   Coronary Arteries: Sufficient height above annulus for deployment   Optimum Fluoroscopic Angle for Delivery: LAO 3 Caudal 3 degrees   Membranous septal length 7.7 mm   IMPRESSION: 1. Tri leaflet calcified AV with restricted leaflet motion Calcium Score 1272   2. Annular area of 361 mm2 suitable for a 23 mm Sapien 3 valve Sinuses are small for a 26 mm Medtronic Evolut   3.  Optimal angiographic angle for deployment LAO 3 Caudal 3 degrees   4.  Coronary arteries sufficient height above annulus for deployment   5.  Membranous septal length 7.7 mm   6.  Dilated right main pulmonary artery 2.7 cm      Recent Lab Findings: Lab Results  Component Value Date   WBC 7.2 06/23/2022   HGB 10.9 (L) 07/07/2022   HCT 32.0 (L) 07/07/2022   PLT 168 06/23/2022   GLUCOSE 96 06/23/2022   ALT 11 11/23/2021   AST  15 11/23/2021   NA  144 07/07/2022   K 3.5 07/07/2022   CL 101 06/23/2022   CREATININE 0.83 06/23/2022   BUN 17 06/23/2022   CO2 23 06/23/2022   TSH 1.557 11/23/2021   INR 1.2 11/23/2021      Assessment / Plan:     82 yo female with NYHA class II symptoms of severe AS with normal LV function without CAD. With her age and comorbidities I feel she is an ideal candidate for TAVR and would use right femoral artery and sapien 23 mm valve. We discussed the risks and goals of surgery and she understands and wishes to proceed. I would offer her a bail out operation unless circumstances make survivability not likely   I have spent 60 min in review of the records, viewing studies and in face to face with patient and in coordination of future care    Eugenio Hoes 07/14/2022 8:37 AM

## 2022-07-14 NOTE — Patient Instructions (Signed)
TAVR

## 2022-07-15 ENCOUNTER — Telehealth: Payer: Self-pay | Admitting: Pulmonary Disease

## 2022-07-15 ENCOUNTER — Telehealth: Payer: Self-pay | Admitting: Cardiovascular Disease

## 2022-07-15 NOTE — Telephone Encounter (Signed)
Iona Coach, RN 07/15/2022  9:42 AM EDT Back to Top    Pt aware of results by phone.The pt would like to hold off on scheduling TAVR until she can be seen by pulmonary and given additional information about the lung nodule.   Per review of the pt's chart, pulmonary has already left the pt a message to contact them to arrange consultation to discuss lung nodule. I advised the patient to contact the pulmonary office to arrange appointment and they can order the appropriate testing going forward. Pt agreed with plan.   Cindi Carbon [place], RN 07/14/2022  3:21 PM EDT     Left message for patient to call back.   Michaelle Copas, [facility] 07/11/2022 11:02 AM EDT     Left message for patient to call office for results.   Wendall Stade, MD 07/11/2022  9:41 AM EDT     Needs to see pulmonary for enlarging lung nodule likely cancer Order PET/CT of chest    Per chart patient is aware of results.

## 2022-07-15 NOTE — Telephone Encounter (Signed)
Patient stated she is returning RN Pam's call.

## 2022-07-15 NOTE — Telephone Encounter (Signed)
Received messaged from Dr Tonia Brooms requesting for patient to be seen as a lung nodule consult. LMTCB for patient to schedule. If patient calls, please schedule at the patient's soonest availability with either Byrum, Meier or Icard in lung nodule hold spots.

## 2022-07-16 ENCOUNTER — Telehealth: Payer: Self-pay | Admitting: Cardiovascular Disease

## 2022-07-16 NOTE — Telephone Encounter (Signed)
Returned call to patient. She states she spoke with Pam and wanted to know where the lung nodule was and how large it was.  Shared with patient from CT scan: measuring 1.6 x 1.3 x 1.6 cm in the posterior aspect of the right upper lobe  Patient verbalized understanding. She states she has an appt with pulmonologist on 6/13. Patient expressed appreciation for call, no further needs voiced.

## 2022-07-16 NOTE — Telephone Encounter (Signed)
Pt stated she was returning nurse call, she said she tried to reach out yesterday as well but would really like to speak with her. Please advise

## 2022-07-29 NOTE — Telephone Encounter (Signed)
Patient scheduled 6/13 with Byrum. Nothing further needed.

## 2022-07-29 NOTE — Progress Notes (Signed)
Cardiology Office Note:    Date:  08/11/2022   ID:  Barbara Lucero, DOB June 24, 1940, MRN [id number]  PCP:  [name], MD  [facility] Providers Cardiologist:  Charlton Haws, MD     Referring MD: [name], MD   Chief Complaint:  No chief complaint on file.     History of Present Illness:   Barbara Lucero is a 82 y.o. female with   history of anxiety, depression, Crohn's disease, prior DVT, HTN, HLD and severe aortic stenosis    Echo March 2024 with LVEF=60-65%. Severe aortic stenosis with calcified, thickened leaflets, mean gradient 45.7 mmHg, peak gradient 78.5 mmHg, AVA 0.64 cm2, DI 0.20. She has chronic back pain and uses a walker when she is out of the house. She is no longer on anti-coagulation for DVT.   She saw Dr. Pearletha Alfred 06/23/22 to be considered for TAVR. Cath showed nonobstructive CAD. CTA has been done and she has needs an appt to see CT surgeon for TAVR. Also has lung nodule she's seeing Dr. Delton Coombes for.  Patient comes in with her daughter. Denies chest pain, dyspnea, palpitations. Wears compression hose for swelling. Main complaint is back and leg pain. Is scheduled for a lung biopsy and wants to make sure she's ok to undergo anesthesia.     Past Medical History:  Diagnosis Date   Anxiety    Arthritis    Crohn disease (HCC)    Depression    HTN (hypertension)    Hyperlipidemia    Neuropathy    Severe aortic stenosis    Varicose veins of bilateral lower extremities with other complications    Current Medications: Current Meds  Medication Sig   acetaminophen (TYLENOL) 500 MG tablet Take 1,200 mg by mouth 2 (two) times daily.   ALPRAZolam (XANAX) 0.25 MG tablet Take 0.25 mg by mouth at bedtime.   Ascorbic Acid (VITAMIN C) 1000 MG tablet Take 1,000 mg by mouth.    aspirin EC 81 MG tablet Take 81 mg by mouth daily. Swallow whole.   b complex vitamins tablet Take 1 tablet by mouth daily.   CALCIUM PO Take 600 mg by mouth daily.   Cholecalciferol (VITAMIN  D3) 50 MCG (2000 UT) capsule Take 2,000 Units by mouth daily.   cyanocobalamin (VITAMIN B12) 1000 MCG tablet Take 1,000 mcg by mouth daily.   furosemide (LASIX) 20 MG tablet Take 20 mg by mouth once a week.   gabapentin (NEURONTIN) 100 MG capsule Take 1 capsule (100 mg total) by mouth 3 (three) times daily. (Patient taking differently: Take 100 mg by mouth 2 (two) times daily.)   Multiple Vitamins-Minerals (MULTIVITAMIN WITH MINERALS) tablet Take 1 tablet by mouth daily.   olmesartan (BENICAR) 40 MG tablet Take 40 mg by mouth daily.   pantoprazole (PROTONIX) 40 MG tablet Take 40 mg by mouth daily.   Psyllium (METAMUCIL PO) Take 1 Package by mouth daily as needed (constipation).   sertraline (ZOLOFT) 100 MG tablet Take 100 mg by mouth at bedtime.   simvastatin (ZOCOR) 10 MG tablet Take 10 mg by mouth in the morning.   solifenacin (VESICARE) 10 MG tablet Take 10 mg by mouth at bedtime.   sulfaSALAzine (AZULFIDINE) 500 MG tablet Take 1 tablet (500 mg total) by mouth 2 (two) times daily.   vitamin E 400 UNIT capsule Take 400 Units by mouth daily.    Allergies:   Coconut (cocos nucifera) and Latex   Social History   Tobacco Use  Smoking status: Former    Packs/day: 0.50    Years: 20.00    Additional pack years: 0.00    Total pack years: 10.00    Types: Cigarettes   Smokeless tobacco: Never  Vaping Use   Vaping Use: Never used  Substance Use Topics   Alcohol use: No   Drug use: No    Family Hx: The patient's family history includes Bipolar disorder in her daughter; Breast cancer in her sister and sister; Heart attack in her father; Other in her son; Stroke in her mother.  ROS     Physical Exam:    VS:  BP 132/70   Pulse 72   Ht 5\' 4"  (1.626 m)   Wt 174 lb 9.6 oz (79.2 kg)   SpO2 94%   BMI 29.97 kg/m     Wt Readings from Last 3 Encounters:  08/11/22 174 lb 9.6 oz (79.2 kg)  08/07/22 173 lb 3.2 oz (78.6 kg)  07/14/22 172 lb (78 kg)    Physical Exam  GEN: Well  nourished, well developed, in no acute distress  Neck: no JVD, carotid bruits, or masses Cardiac:RRR; 5/6 systolic murmur LSB Respiratory:  clear to auscultation bilaterally, normal work of breathing GI: soft, nontender, nondistended, + BS Ext: without cyanosis, clubbing, or edema, Good distal pulses bilaterally Neuro:  Alert and Oriented x 3,  Psych: euthymic mood, full affect        EKGs/Labs/Other Test Reviewed:    EKG:  EKG is not ordered today.     Recent Labs: 11/23/2021: ALT 11; Magnesium 2.2; TSH 1.557 06/23/2022: BUN 17; Creatinine, Ser 0.83; Platelets 168 07/07/2022: Hemoglobin 10.9; Potassium 3.5; Sodium 144   Recent Lipid Panel No results for input(s): "CHOL", "TRIG", "HDL", "VLDL", "LDLCALC", "LDLDIRECT" in the last 8760 hours.   Prior CV Studies:  Echo 04/2022 Echo March 2024:  1. Left ventricular ejection fraction, by estimation, is 60 to 65%. The  left ventricle has normal function. The left ventricle has no regional  wall motion abnormalities. Left ventricular diastolic parameters are  consistent with Grade I diastolic  dysfunction (impaired relaxation). Elevated left atrial pressure.   2. Right ventricular systolic function is normal. The right ventricular  size is normal. There is mildly elevated pulmonary artery systolic  pressure.   3. Left atrial size was severely dilated.   4. The mitral valve is normal in structure. Trivial mitral valve  regurgitation. No evidence of mitral stenosis.   5. The tricuspid valve is abnormal.   6. The aortic valve has an indeterminant number of cusps. There is  moderate calcification of the aortic valve. There is moderate thickening  of the aortic valve. Aortic valve regurgitation is not visualized. Severe  aortic valve stenosis. Aortic valve  mean gradient measures 45.7 mmHg. Aortic valve peak gradient measures 78.5  mmHg. Aortic valve area, by VTI measures 0.64 cm.   7. The inferior vena cava is normal in size with  greater than 50%  respiratory variability, suggesting right atrial pressure of 3 mmHg.   FINDINGS   Left Ventricle: Left ventricular ejection fraction, by estimation, is 60  to 65%. The left ventricle has normal function. The left ventricle has no  regional wall motion abnormalities. Definity contrast agent was given IV  to delineate the left ventricular   endocardial borders. Global longitudinal strain performed but not  reported based on interpreter judgement due to suboptimal tracking. The  left ventricular internal cavity size was normal in size. There is no left  ventricular hypertrophy. Left  ventricular diastolic parameters are consistent with Grade I diastolic  dysfunction (impaired relaxation). Elevated left atrial pressure.   Right Ventricle: The right ventricular size is normal. Right vetricular  wall thickness was not well visualized. Right ventricular systolic  function is normal. There is mildly elevated pulmonary artery systolic  pressure. The tricuspid regurgitant velocity   is 2.76 m/s, and with an assumed right atrial pressure of 8 mmHg, the  estimated right ventricular systolic pressure is 38.5 mmHg.   Left Atrium: Left atrial size was severely dilated.   Right Atrium: Right atrial size was normal in size.   Pericardium: There is no evidence of pericardial effusion.   Mitral Valve: The mitral valve is normal in structure. Trivial mitral  valve regurgitation. No evidence of mitral valve stenosis. MV peak  gradient, 8.0 mmHg. The mean mitral valve gradient is 3.0 mmHg.   Tricuspid Valve: The tricuspid valve is abnormal. Tricuspid valve  regurgitation is mild . No evidence of tricuspid stenosis.   Aortic Valve: The aortic valve has an indeterminant number of cusps. There  is moderate calcification of the aortic valve. There is moderate  thickening of the aortic valve. There is moderate aortic valve annular  calcification. Aortic valve regurgitation  is not  visualized. Severe aortic stenosis is present. Aortic valve mean  gradient measures 45.7 mmHg. Aortic valve peak gradient measures 78.5  mmHg. Aortic valve area, by VTI measures 0.64 cm.   Pulmonic Valve: The pulmonic valve was not well visualized. Pulmonic valve  regurgitation is not visualized. No evidence of pulmonic stenosis.   Aorta: The aortic root is normal in size and structure.   Venous: The inferior vena cava is normal in size with greater than 50%  respiratory variability, suggesting right atrial pressure of 3 mmHg.   IAS/Shunts: The interatrial septum was not well visualized.     LEFT VENTRICLE  PLAX 2D  LVIDd:         4.20 cm   Diastology  LVIDs:         2.80 cm   LV e' medial:    5.11 cm/s  LV PW:         1.00 cm   LV E/e' medial:  20.0  LV IVS:        1.00 cm   LV e' lateral:   8.59 cm/s  LVOT diam:     2.00 cm   LV E/e' lateral: 11.9  LV SV:         71  LV SV Index:   38  LVOT Area:     3.14 cm                             3D Volume EF:                           3D EF:        55 %                           LV EDV:       140 ml                           LV ESV:       64 ml  LV SV:        77 ml   RIGHT VENTRICLE  RV S prime:     12.00 cm/s  TAPSE (M-mode): 2.0 cm   LEFT ATRIUM              Index        RIGHT ATRIUM           Index  LA diam:        4.50 cm  2.39 cm/m   RA Area:     16.00 cm  LA Vol (A2C):   116.0 ml 61.67 ml/m  RA Volume:   40.10 ml  21.32 ml/m  LA Vol (A4C):   75.4 ml  40.09 ml/m  LA Biplane Vol: 95.2 ml  50.62 ml/m   AORTIC VALVE                     PULMONIC VALVE  AV Area (Vmax):    0.76 cm      PV Vmax:       3.56 m/s  AV Area (Vmean):   0.69 cm      PV Peak grad:  50.7 mmHg  AV Area (VTI):     0.64 cm  AV Vmax:           443.00 cm/s  AV Vmean:          317.333 cm/s  AV VTI:            1.110 m  AV Peak Grad:      78.5 mmHg  AV Mean Grad:      45.7 mmHg  LVOT Vmax:         107.00 cm/s  LVOT Vmean:         69.800 cm/s  LVOT VTI:          0.225 m  LVOT/AV VTI ratio: 0.20    AORTA  Ao Root diam: 3.00 cm  Ao Asc diam:  3.10 cm   MITRAL VALVE                TRICUSPID VALVE  MV Area (PHT): 2.84 cm     TR Peak grad:   30.5 mmHg  MV Peak grad:  8.0 mmHg     TR Vmax:        276.00 cm/s  MV Mean grad:  3.0 mmHg  MV Vmax:       1.41 m/s     SHUNTS  MV Vmean:      80.7 cm/s    Systemic VTI:  0.22 m  MV Decel Time: 267 msec     Systemic Diam: 2.00 cm  MV E velocity: 102.00 cm/s  MV A velocity: 144.00 cm/s  MV E/A ratio:  0.71  RIGHT HEART CATH AND CORONARY ANGIOGRAPHY 07/07/2022  Narrative   Mid Cx lesion is 30% stenosed.   Ost LAD to Prox LAD lesion is 20% stenosed.   Mid LAD lesion is 30% stenosed.   Prox RCA lesion is 50% stenosed.  Mild non-obstructive CAD Mild elevation right heart pressures PCWP 10 mmHg Mean PA pressure 33 Severe aortic stenosis by echo (I did not cross the aortic valve today).  Recommendations: Will continue with workup for TAVR.   CT CORONARY MORPH W/CTA COR W/SCORE W/CA W/CM &/OR WO/CM 07/10/2022  Addendum 07/11/2022  8:06 AM ADDENDUM REPORT: 07/11/2022 08:04  ADDENDUM: Please see separate dictation for contemporaneously obtained CTA chest, abdomen and pelvis dated 07/10/2022 for full description of relevant  extracardiac findings.   Electronically Signed By: Trudie Reed M.D. On: 07/11/2022 08:04  Narrative CLINICAL DATA:  Aortic Stenosis  EXAM: Cardiac TAVR CT  TECHNIQUE: The patient was scanned on a Siemens Force 192 slice scanner. A 120 kV retrospective scan was triggered in the ascending thoracic aorta at 140 HU's. Gantry rotation speed was 250 msecs and collimation was .6 mm. No beta blockade or nitro were given. The 3D data set was reconstructed in 5% intervals of the R-R cycle. Systolic and diastolic phases were analyzed on a dedicated work station using MPR, MIP and VRT modes. The patient received 80 cc of  contrast.  FINDINGS: Aortic Valve: Tri leaflet calcified with restricted leaflet motion Calcium score 1272  Aorta: No aneurysm Bovine arch moderate calcific atherosclerosis  Sino-tubular Junction: 24 mm  Ascending Thoracic Aorta: 29 mm  Aortic Arch: 24 mm  Descending Thoracic Aorta: 26 mm  Sinus of Valsalva Measurements:  Non-coronary: 26 mm   height 17.2 mm  Right - coronary: 25 mm  height 16.8  Left -   coronary: 25.7 mm  height 18.7 mm  Coronary Artery Height above Annulus:  Left Main: 14,4 mm above annulus  Right Coronary: 14.3 mm above annulus  Virtual Basal Annulus Measurements:  Maximum / Minimum Diameter: 21.1 mm x 20.2 mm Average diameter 21.4 mm  Perimeter: 68.1 mm  Area: 361 mm2  Coronary Arteries: Sufficient height above annulus for deployment  Optimum Fluoroscopic Angle for Delivery: LAO 3 Caudal 3 degrees  Membranous septal length 7.7 mm  IMPRESSION: 1. Tri leaflet calcified AV with restricted leaflet motion Calcium Score 1272  2. Annular area of 361 mm2 suitable for a 23 mm Sapien 3 valve Sinuses are small for a 26 mm Medtronic Evolut  3.  Optimal angiographic angle for deployment LAO 3 Caudal 3 degrees  4.  Coronary arteries sufficient height above annulus for deployment  5.  Membranous septal length 7.7 mm  6.  Dilated right main pulmonary artery 2.7 cm  Charlton Haws  Electronically Signed: By: Charlton Haws M.D. On: 07/10/2022 17:02        Risk Assessment/Calculations/Metrics:              ASSESSMENT & PLAN:   No problem-specific Assessment & Plan notes found for this encounter.   Severe AS-plan for TAVR, currently stable without symptoms. Activity limited due to back pain.   Lung nodule needs biopsy before surgery-higher risk for anesthesia with severe AS but needs to be done before TAVR. Dr. Eden Emms and Dr. Delton Coombes have spoken and cleared her for lung biopsy.  According to the Revised Cardiac Risk Index (RCRI), her  Perioperative Risk of Major Cardiac Event is (%): 0.9  Her Functional Capacity in METs is: 4.73 according to the [facility] Activity Status Index (DASI).  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   Medication Adjustments/Labs and Tests Ordered: Current medicines are reviewed at length with the patient today.  Concerns regarding medicines are outlined above.  Tests Ordered: No orders of the defined types were placed in this encounter.  Medication Changes: No orders of the defined types were placed in this encounter.  Signed, Jacolyn Reedy, PA-C  08/11/2022 1:40 PM    [facility] [address] [place], [place], [place]  [address] Phone: [phone]; Fax: [phone]

## 2022-08-07 ENCOUNTER — Encounter: Payer: Self-pay | Admitting: Emergency Medicine

## 2022-08-07 ENCOUNTER — Ambulatory Visit: Payer: Medicare Other | Admitting: Emergency Medicine

## 2022-08-07 ENCOUNTER — Institutional Professional Consult (permissible substitution): Payer: Medicare Other | Admitting: Emergency Medicine

## 2022-08-07 VITALS — BP 120/68 | HR 72 | Temp 98.1°F | Ht 63.0 in | Wt 173.2 lb

## 2022-08-07 DIAGNOSIS — R911 Solitary pulmonary nodule: Secondary | ICD-10-CM

## 2022-08-07 NOTE — Patient Instructions (Signed)
We reviewed your CT scans of the chest today. We will work on arranging robotic assisted navigational bronchoscopy to evaluate her right upper lobe pulmonary nodule.  This will be done under general anesthesia as an outpatient at [facility] endoscopy.  We will try to get this arranged for 09/01/2022.  You will need a designated driver and someone to watch you at home on that day after the procedure. We will consult with Dr. Eden Emms to ensure that there are no concerns regarding general anesthesia due to your aortic stenosis. Follow Dr. Delton Coombes in 6 weeks or next available

## 2022-08-07 NOTE — H&P (View-Only) (Signed)
 Subjective:    Patient ID: Barbara Lucero, female    DOB: 10/16/1940, 82 y.o.   MRN: [id number]  HPI 82-year-old woman, former smoker (20 pack years) with a history of hypertension and severe aortic stenosis, Crohn's disease, hyperlipidemia.  She is under evaluation for TAVR to treat her aortic valve.  She has undergone CT chest, is here today to discuss pulmonary nodule as below. Her functional capacity is limited by her neuropathy, which may be impacted by her AS. She doesn't really get exertional dyspnea. No CP.   CT angiography chest abdomen pelvis done on 07/10/2022 reviewed by me shows no mediastinal or hilar adenopathy.  There is a 1.6 x 1.6 cm posterior right upper lobe nodule, enlarged compared with 11/22/2021 at which time it was 1.2 cm in largest diameter  Review of Systems As per HPI  Past Medical History:  Diagnosis Date   Anxiety    Arthritis    Crohn disease (HCC)    Depression    HTN (hypertension)    Hyperlipidemia    Neuropathy    Severe aortic stenosis    Varicose veins of bilateral lower extremities with other complications      Family History  Problem Relation Age of Onset   Stroke Mother    Heart attack Father    Breast cancer Sister    Breast cancer Sister    Bipolar disorder Daughter    Other Son        disabled    No hx lung CA  Social History   Socioeconomic History   Marital status: Widowed    Spouse name: Not on file   Number of children: 2   Years of education: Not on file   Highest education level: Not on file  Occupational History   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  Tobacco Use   Smoking status: Former    Packs/day: 0.50    Years: 20.00    Additional pack years: 0.00    Total pack years: 10.00    Types: Cigarettes   Smokeless tobacco: Never  Vaping Use   Vaping Use: Never used  Substance and Sexual Activity   Alcohol use: No   Drug use: No   Sexual activity: Not Currently    Birth control/protection:  Post-menopausal  Other Topics Concern   Not on file  Social History Narrative   Not on file   Social Determinants of Health   Financial Resource Strain: Not on file  Food Insecurity: No Food Insecurity (11/23/2021)   Hunger Vital Sign    Worried About Running Out of Food in the Last Year: Never true    Ran Out of Food in the Last Year: Never true  Transportation Needs: No Transportation Needs (11/23/2021)   PRAPARE - Transportation    Lack of Transportation (Medical): No    Lack of Transportation (Non-Medical): No  Physical Activity: Not on file  Stress: Not on file  Social Connections: Not on file  Intimate Partner Violence: Not At Risk (11/23/2021)   Humiliation, Afraid, Rape, and Kick questionnaire    Fear of Current or Ex-Partner: No    Emotionally Abused: No    Physically Abused: No    Sexually Abused: No      Allergies  Allergen Reactions   Coconut (Cocos Nucifera) Hives   Latex Hives     Outpatient Medications Prior to Visit  Medication Sig Dispense Refill   acetaminophen (TYLENOL) 500 MG tablet Take 1,200 mg   by mouth 2 (two) times daily.     albuterol (VENTOLIN HFA) 108 (90 Base) MCG/ACT inhaler Inhale 2 puffs into the lungs every 4 (four) hours as needed for shortness of breath or wheezing. (Patient not taking: Reported on 08/11/2022)     ALPRAZolam (XANAX) 0.25 MG tablet Take 0.25 mg by mouth at bedtime.     Ascorbic Acid (VITAMIN C) 1000 MG tablet Take 1,000 mg by mouth.      aspirin EC 81 MG tablet Take 81 mg by mouth daily. Swallow whole.     b complex vitamins tablet Take 1 tablet by mouth daily.     CALCIUM PO Take 600 mg by mouth daily.     Cholecalciferol (VITAMIN D3) 50 MCG (2000 UT) capsule Take 2,000 Units by mouth daily.     cyanocobalamin (VITAMIN B12) 1000 MCG tablet Take 1,000 mcg by mouth daily.     furosemide (LASIX) 20 MG tablet Take 20 mg by mouth once a week.     gabapentin (NEURONTIN) 100 MG capsule Take 1 capsule (100 mg total) by mouth 3  (three) times daily. (Patient taking differently: Take 100 mg by mouth 2 (two) times daily.) 90 capsule 1   Multiple Vitamins-Minerals (MULTIVITAMIN WITH MINERALS) tablet Take 1 tablet by mouth daily.     olmesartan (BENICAR) 40 MG tablet Take 40 mg by mouth daily.     pantoprazole (PROTONIX) 40 MG tablet Take 40 mg by mouth daily.     Psyllium (METAMUCIL PO) Take 1 Package by mouth daily as needed (constipation).     sertraline (ZOLOFT) 100 MG tablet Take 100 mg by mouth at bedtime.     simvastatin (ZOCOR) 10 MG tablet Take 10 mg by mouth in the morning.     sulfaSALAzine (AZULFIDINE) 500 MG tablet Take 1 tablet (500 mg total) by mouth 2 (two) times daily. 60 tablet 2   vitamin E 400 UNIT capsule Take 400 Units by mouth daily.     metoprolol tartrate (LOPRESSOR) 50 MG tablet Take one tablet by mouth as directed prior to CT scan 1 tablet 0   mirabegron ER (MYRBETRIQ) 50 MG TB24 tablet TAKE (1) TABLET BY MOUTH AT BEDTIME. 30 tablet 11   No facility-administered medications prior to visit.        Objective:   Physical Exam Vitals:   08/07/22 1037  BP: 120/68  Pulse: 72  Temp: 98.1 F (36.7 C)  TempSrc: Oral  SpO2: 90%  Weight: 173 lb 3.2 oz (78.6 kg)  Height: 5' 3" (1.6 m)   Gen: Pleasant, overwt woman, in no distress,  normal affect  ENT: No lesions,  mouth clear,  oropharynx clear, no postnasal drip  Neck: No JVD, no stridor  Lungs: No use of accessory muscles, no crackles or wheezing on normal respiration, no wheeze on forced expiration  Cardiovascular: RRR, 3/6 systolic murmur.  Trace lower extremity edema  Musculoskeletal: No deformities, no cyanosis or clubbing  Neuro: alert, awake, non focal  Skin: Warm, no lesions or rash      Assessment & Plan:  Pulmonary nodule 1 cm or greater in diameter High suspicion for primary lung malignancy given history of tobacco use, appearance on imaging.  Discussed options for tissue diagnosis with her and have recommended  navigational bronchoscopy.  Will need to confirm/discuss her risk with cardiology since she is under evaluation for TAVR.  Hopefully she will be cleared to undergo general anesthesia and we can answer the question of the pulmonary nodule before   she has definitive treatment of her valve.   We reviewed your CT scans of the chest today. We will work on arranging robotic assisted navigational bronchoscopy to evaluate her right upper lobe pulmonary nodule.  This will be done under general anesthesia as an outpatient at [facility].  We will try to get this arranged for 09/01/2022.  You will need a designated driver and someone to watch you at home on that day after the procedure. We will consult with Dr. Nishan to ensure that there are no concerns regarding general anesthesia due to your aortic stenosis. Follow Dr. Rockwell Zentz in 6 weeks or next available   Davionte Lusby, MD, PhD 08/14/2022, 6:02 PM [facility] [phone] or if no answer before 7:00PM call [phone] For any issues after 7:00PM please call eLink [phone]   

## 2022-08-07 NOTE — Progress Notes (Signed)
Subjective:    Patient ID: Barbara Lucero, female    DOB: 05/28/1940, 82 y.o.   MRN: [id number]  HPI 82 year old woman, former smoker (20 pack years) with a history of hypertension and severe aortic stenosis, Crohn's disease, hyperlipidemia.  She is under evaluation for TAVR to treat her aortic valve.  She has undergone CT chest, is here today to discuss pulmonary nodule as below. Her functional capacity is limited by her neuropathy, which may be impacted by her AS. She doesn't really get exertional dyspnea. No CP.   CT angiography chest abdomen pelvis done on 07/10/2022 reviewed by me shows no mediastinal or hilar adenopathy.  There is a 1.6 x 1.6 cm posterior right upper lobe nodule, enlarged compared with 11/22/2021 at which time it was 1.2 cm in largest diameter  Review of Systems As per HPI  Past Medical History:  Diagnosis Date   Anxiety    Arthritis    Crohn disease (HCC)    Depression    HTN (hypertension)    Hyperlipidemia    Neuropathy    Severe aortic stenosis    Varicose veins of bilateral lower extremities with other complications      Family History  Problem Relation Age of Onset   Stroke Mother    Heart attack Father    Breast cancer Sister    Breast cancer Sister    Bipolar disorder Daughter    Other Son        disabled    No hx lung CA  Social History   Socioeconomic History   Marital status: Widowed    Spouse name: Not on file   Number of children: 2   Years of education: Not on file   Highest education level: Not on file  Occupational History   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  Tobacco Use   Smoking status: Former    Packs/day: 0.50    Years: 20.00    Additional pack years: 0.00    Total pack years: 10.00    Types: Cigarettes   Smokeless tobacco: Never  Vaping Use   Vaping Use: Never used  Substance and Sexual Activity   Alcohol use: No   Drug use: No   Sexual activity: Not Currently    Birth control/protection:  Post-menopausal  Other Topics Concern   Not on file  Social History Narrative   Not on file   Social Determinants of Health   Financial Resource Strain: Not on file  Food Insecurity: No Food Insecurity (11/23/2021)   Hunger Vital Sign    Worried About Running Out of Food in the Last Year: Never true    Ran Out of Food in the Last Year: Never true  Transportation Needs: No Transportation Needs (11/23/2021)   PRAPARE - Administrator, Civil Service (Medical): No    Lack of Transportation (Non-Medical): No  Physical Activity: Not on file  Stress: Not on file  Social Connections: Not on file  Intimate Partner Violence: Not At Risk (11/23/2021)   Humiliation, Afraid, Rape, and Kick questionnaire    Fear of Current or Ex-Partner: No    Emotionally Abused: No    Physically Abused: No    Sexually Abused: No      Allergies  Allergen Reactions   Coconut (Cocos Nucifera) Hives   Latex Hives     Outpatient Medications Prior to Visit  Medication Sig Dispense Refill   acetaminophen (TYLENOL) 500 MG tablet Take 1,200 mg  by mouth 2 (two) times daily.     albuterol (VENTOLIN HFA) 108 (90 Base) MCG/ACT inhaler Inhale 2 puffs into the lungs every 4 (four) hours as needed for shortness of breath or wheezing. (Patient not taking: Reported on 08/11/2022)     ALPRAZolam (XANAX) 0.25 MG tablet Take 0.25 mg by mouth at bedtime.     Ascorbic Acid (VITAMIN C) 1000 MG tablet Take 1,000 mg by mouth.      aspirin EC 81 MG tablet Take 81 mg by mouth daily. Swallow whole.     b complex vitamins tablet Take 1 tablet by mouth daily.     CALCIUM PO Take 600 mg by mouth daily.     Cholecalciferol (VITAMIN D3) 50 MCG (2000 UT) capsule Take 2,000 Units by mouth daily.     cyanocobalamin (VITAMIN B12) 1000 MCG tablet Take 1,000 mcg by mouth daily.     furosemide (LASIX) 20 MG tablet Take 20 mg by mouth once a week.     gabapentin (NEURONTIN) 100 MG capsule Take 1 capsule (100 mg total) by mouth 3  (three) times daily. (Patient taking differently: Take 100 mg by mouth 2 (two) times daily.) 90 capsule 1   Multiple Vitamins-Minerals (MULTIVITAMIN WITH MINERALS) tablet Take 1 tablet by mouth daily.     olmesartan (BENICAR) 40 MG tablet Take 40 mg by mouth daily.     pantoprazole (PROTONIX) 40 MG tablet Take 40 mg by mouth daily.     Psyllium (METAMUCIL PO) Take 1 Package by mouth daily as needed (constipation).     sertraline (ZOLOFT) 100 MG tablet Take 100 mg by mouth at bedtime.     simvastatin (ZOCOR) 10 MG tablet Take 10 mg by mouth in the morning.     sulfaSALAzine (AZULFIDINE) 500 MG tablet Take 1 tablet (500 mg total) by mouth 2 (two) times daily. 60 tablet 2   vitamin E 400 UNIT capsule Take 400 Units by mouth daily.     metoprolol tartrate (LOPRESSOR) 50 MG tablet Take one tablet by mouth as directed prior to CT scan 1 tablet 0   mirabegron ER (MYRBETRIQ) 50 MG TB24 tablet TAKE (1) TABLET BY MOUTH AT BEDTIME. 30 tablet 11   No facility-administered medications prior to visit.        Objective:   Physical Exam Vitals:   08/07/22 1037  BP: 120/68  Pulse: 72  Temp: 98.1 F (36.7 C)  TempSrc: Oral  SpO2: 90%  Weight: 173 lb 3.2 oz (78.6 kg)  Height: 5\' 3"  (1.6 m)   Gen: Pleasant, overwt woman, in no distress,  normal affect  ENT: No lesions,  mouth clear,  oropharynx clear, no postnasal drip  Neck: No JVD, no stridor  Lungs: No use of accessory muscles, no crackles or wheezing on normal respiration, no wheeze on forced expiration  Cardiovascular: RRR, 3/6 systolic murmur.  Trace lower extremity edema  Musculoskeletal: No deformities, no cyanosis or clubbing  Neuro: alert, awake, non focal  Skin: Warm, no lesions or rash      Assessment & Plan:  Pulmonary nodule 1 cm or greater in diameter High suspicion for primary lung malignancy given history of tobacco use, appearance on imaging.  Discussed options for tissue diagnosis with her and have recommended  navigational bronchoscopy.  Will need to confirm/discuss her risk with cardiology since she is under evaluation for TAVR.  Hopefully she will be cleared to undergo general anesthesia and we can answer the question of the pulmonary nodule before  she has definitive treatment of her valve.   We reviewed your CT scans of the chest today. We will work on arranging robotic assisted navigational bronchoscopy to evaluate her right upper lobe pulmonary nodule.  This will be done under general anesthesia as an outpatient at [facility] endoscopy.  We will try to get this arranged for 09/01/2022.  You will need a designated driver and someone to watch you at home on that day after the procedure. We will consult with Dr. Eden Emms to ensure that there are no concerns regarding general anesthesia due to your aortic stenosis. Follow Dr. Delton Coombes in 6 weeks or next available   Levy Pupa, MD, PhD 08/14/2022, 6:02 PM [facility] [phone] or if no answer before 7:00PM call [phone] For any issues after 7:00PM please call eLink [phone]

## 2022-08-08 ENCOUNTER — Encounter: Payer: Self-pay | Admitting: Emergency Medicine

## 2022-08-11 ENCOUNTER — Encounter: Payer: Self-pay | Admitting: Physician Assistant

## 2022-08-11 ENCOUNTER — Ambulatory Visit: Payer: Medicare Other | Attending: Student | Admitting: Physician Assistant

## 2022-08-11 VITALS — BP 132/70 | HR 72 | Ht 64.0 in | Wt 174.6 lb

## 2022-08-11 DIAGNOSIS — I10 Essential (primary) hypertension: Secondary | ICD-10-CM

## 2022-08-11 DIAGNOSIS — I350 Nonrheumatic aortic (valve) stenosis: Secondary | ICD-10-CM | POA: Diagnosis not present

## 2022-08-11 DIAGNOSIS — E785 Hyperlipidemia, unspecified: Secondary | ICD-10-CM

## 2022-08-11 DIAGNOSIS — Z01818 Encounter for other preprocedural examination: Secondary | ICD-10-CM

## 2022-08-11 DIAGNOSIS — R911 Solitary pulmonary nodule: Secondary | ICD-10-CM | POA: Diagnosis not present

## 2022-08-11 DIAGNOSIS — I2510 Atherosclerotic heart disease of native coronary artery without angina pectoris: Secondary | ICD-10-CM

## 2022-08-11 NOTE — Progress Notes (Signed)
I will fax notes to Dr. Kavin Leech office. Does seem that we have not received an official clearance request per [facility] pre op protocol. However; notes from Dr. Eden Emms states that he has already d/w Dr. Inis Sizer and have given clearance. I will update all parties involved as FYI.

## 2022-08-11 NOTE — Patient Instructions (Signed)
Medication Instructions:  Your physician recommends that you continue on your current medications as directed. Please refer to the Current Medication list given to you today.  *If you need a refill on your cardiac medications before your next appointment, please call your pharmacy*   Lab Work: None If you have labs (blood work) drawn today and your tests are completely normal, you will receive your results only by: MyChart Message (if you have MyChart) OR A paper copy in the mail If you have any lab test that is abnormal or we need to change your treatment, we will call you to review the results.   Testing/Procedures: None   Follow-Up: At [facility], you and your health needs are our priority.  As part of our continuing mission to provide you with exceptional heart care, we have created designated Provider Care Teams.  These Care Teams include your primary Cardiologist (physician) and Advanced Practice Providers (APPs -  Physician Assistants and Nurse Practitioners) who all work together to provide you with the care you need, when you need it.  We recommend signing up for the patient portal called "MyChart".  Sign up information is provided on this After Visit Summary.  MyChart is used to connect with patients for Virtual Visits (Telemedicine).  Patients are able to view lab/test results, encounter notes, upcoming appointments, etc.  Non-urgent messages can be sent to your provider as well.   To learn more about what you can do with MyChart, go to [email or web address].    Your next appointment:   6 month(s)  Provider:   Charlton Haws, MD    Other Instructions

## 2022-08-14 DIAGNOSIS — R911 Solitary pulmonary nodule: Secondary | ICD-10-CM | POA: Insufficient documentation

## 2022-08-14 NOTE — Assessment & Plan Note (Signed)
High suspicion for primary lung malignancy given history of tobacco use, appearance on imaging.  Discussed options for tissue diagnosis with her and have recommended navigational bronchoscopy.  Will need to confirm/discuss her risk with cardiology since she is under evaluation for TAVR.  Hopefully she will be cleared to undergo general anesthesia and we can answer the question of the pulmonary nodule before she has definitive treatment of her valve.   We reviewed your CT scans of the chest today. We will work on arranging robotic assisted navigational bronchoscopy to evaluate her right upper lobe pulmonary nodule.  This will be done under general anesthesia as an outpatient at [facility] endoscopy.  We will try to get this arranged for 09/01/2022.  You will need a designated driver and someone to watch you at home on that day after the procedure. We will consult with Dr. Eden Emms to ensure that there are no concerns regarding general anesthesia due to your aortic stenosis. Follow Dr. Delton Coombes in 6 weeks or next available

## 2022-08-27 ENCOUNTER — Encounter (HOSPITAL_COMMUNITY): Payer: Self-pay | Admitting: Emergency Medicine

## 2022-08-27 ENCOUNTER — Other Ambulatory Visit: Payer: Self-pay

## 2022-08-27 NOTE — Progress Notes (Addendum)
SDW CALL  Patient was given pre-op instructions over the phone. The opportunity was given for the patient to ask questions. No further questions asked. Patient verbalized understanding of instructions given.   PCP - Kathleene Hazel. Hall,MD Cardiologist - Charlton Haws, MD  PPM/ICD - denies Device Orders -  Rep Notified -   Chest x-ray - CTA 07/10/22 EKG - 06/23/22 Stress Test -  ECHO - 05/06/22 Cardiac Cath - 07/07/22  Sleep Study - none CPAP - no  Fasting Blood Sugar - na Checks Blood Sugar _____ times a day  Blood Thinner Instructions:na Aspirin Instructions:Pt instructed by Dr.Byrum to stop Aspirin on 7/5.  ERAS Protcol -no PRE-SURGERY Ensure or G2-   COVID TEST- na   Anesthesia review: yes- hx CAD,cardiac clearance 08/11/22  Patient denies shortness of breath, fever, cough and chest pain over the phone call    Surgical Instructions    Your procedure is scheduled on Monday July 8  Report to [facility] Main Entrance "A" at 8:30 A.M., then check in with the Admitting office.  Call this number if you have problems the morning of surgery:  [phone]    Remember:  Do not eat or drink anything after midnight the night before your surgery   Take these medicines the morning of surgery with A SIP OF WATER: Gabapentin,Protonix,Zocor,Tylenol,Sulfasalazine   As of today, STOP taking  Aleve, Naproxen, Ibuprofen, Motrin, Advil, Goody's, BC's, all herbal medications, fish oil, and all vitamins.  [facility] is not responsible for any belongings or valuables. .   Do NOT Smoke (Tobacco/Vaping)  24 hours prior to your procedure  If you use a CPAP at night, you may bring your mask for your overnight stay.   Contacts, glasses, hearing aids, dentures or partials may not be worn into surgery, please bring cases for these belongings   Patients discharged the day of surgery will not be allowed to drive home, and someone needs to stay with them for 24 hours.  Special instructions:     Oral Hygiene is also important to reduce your risk of infection.  Remember - BRUSH YOUR TEETH THE MORNING OF SURGERY WITH YOUR REGULAR TOOTHPASTE   Day of Surgery:  Take a shower the day of or night before with antibacterial soap. Wear Clean/Comfortable clothing the morning of surgery Do not apply any deodorants/lotions.   Do not wear jewelry or makeup Do not wear lotions, powders, perfumes/colognes, or deodorant. Do not shave 48 hours prior to surgery.  Men may shave face and neck. Do not bring valuables to the hospital. Do not wear nail polish, gel polish, artificial nails, or any other type of covering on natural nails (fingers and toes) If you have artificial nails or gel coating that need to be removed by a nail salon, please have this removed prior to surgery. Artificial nails or gel coating may interfere with anesthesia's ability to adequately monitor your vital signs. Remember to brush your teeth WITH YOUR REGULAR TOOTHPASTE.

## 2022-08-29 ENCOUNTER — Encounter (HOSPITAL_COMMUNITY): Payer: Self-pay | Admitting: Emergency Medicine

## 2022-08-29 NOTE — Progress Notes (Signed)
Anesthesia Chart Review: SAME DAY WORK-UP  Case: [id number] Date/Time: 09/01/22 1100   Procedure: ROBOTIC ASSISTED NAVIGATIONAL BRONCHOSCOPY (Right)   Anesthesia type: General   Pre-op diagnosis: RIGHT UPPER LOBE NODULE   [facility] RO[facility] 3 / [facility] END[facility]   Surgeons: Leslye Peer, MD       DISCUSSION: Patient is an 82 year old female scheduled for the above procedure.   History includes former smoker, aortic stenosis, HTN, HLD, GERD, neuropathy, varicose veins, skin cancer, Crohn's disease (s/p bowel resection (1970), arthritis (left hemi-arthroplasty 06/06/16), DVT (LLE 07/27/17).   Last cardiology vist was on 08/11/22 with Jacolyn Reedy, PA-C for follow-up and preoperative evaluation. Patient is being considered for TAVR for severe AS diagnosed in [place] 2024. Work-up revealed an enlarging RUL pulmonary nodule. Cardiac cath showed mild non-obstructive CAD, mild elevation RH pressures. She wrote, "Lung nodule needs biopsy before surgery-higher risk for anesthesia with severe AS but needs to be done before TAVR. Dr. Eden Emms and Dr. Delton Coombes have spoken and cleared her for lung biopsy.  According to the Revised Cardiac Risk Index (RCRI), her Perioperative Risk of Major Cardiac Event is (%): 0.9   Her Functional Capacity in METs is: 4.73 according to the [facility] Activity Status Index (DASI)."  She is a same day, so anesthesia team to evaluate on the day of surgery.   VS:  BP Readings from Last 3 Encounters:  08/11/22 132/70  08/07/22 120/68  07/14/22 (!) 148/75   Pulse Readings from Last 3 Encounters:  08/11/22 72  08/07/22 72  07/14/22 86    PROVIDERS: [name], MD is PCP  Charlton Haws, MD is cardiologist Verne Carrow, MD is structural heart cardiologist Levy Pupa, MD is pulmonologist   LABS: For day of surgery as indicated. Last results in [facility] include: Lab Results  Component Value Date   WBC 7.2 06/23/2022   HGB 10.9 (L) 07/07/2022   HCT 32.0 (L)  07/07/2022   PLT 168 06/23/2022   GLUCOSE 96 06/23/2022   ALT 11 11/23/2021   AST 15 11/23/2021   NA 144 07/07/2022   K 3.5 07/07/2022   CL 101 06/23/2022   CREATININE 0.83 06/23/2022   BUN 17 06/23/2022   CO2 23 06/23/2022   TSH 1.557 11/23/2021   INR 1.2 11/23/2021     IMAGES: CTA Chest/abd/pelvis 07/10/22: IMPRESSION: 1. Vascular findings and measurements pertinent to potential TAVR procedure, as detailed above. 2. Severe thickening and calcification of the aortic valve, compatible with reported clinical history of severe aortic stenosis. 3. Enlarging pulmonary nodule in the posterior aspect of the right upper lobe which currently measures 1.6 x 1.3 x 1.6 cm, highly concerning for progressive pulmonary neoplasm. Prominent but nonenlarged right hilar lymph node is conspicuous but nonspecific. Further evaluation with PET-CT should be considered in the near future to assess for malignancy. Referral to Pulmonology and/or Thoracic Surgery is also recommended in the near future for further clinical management. 4. Cholelithiasis without evidence of acute cholecystitis. 5. Hepatic steatosis. 6. Colonic diverticulosis without evidence of acute diverticulitis at this time. 7. Additional incidental findings, as above.    EKG: 06/23/2022: Sinus rhythm with premature atrial complexes.  Minimal voltage criteria for LVH, may be normal variant.  Nonspecific T wave abnormality.   CV: CT Coronary 07/10/22: MPRESSION: 1. Tri leaflet calcified AV with restricted leaflet motion Calcium Score 1272 2. Annular area of 361 mm2 suitable for a 23 mm Sapien 3 valve Sinuses are small for a 26 mm Medtronic  Evolut 3.  Optimal angiographic angle for deployment LAO 3 Caudal 3 degrees  4.  Coronary arteries sufficient height above annulus for deployment 5.  Membranous septal length 7.7 mm 6.  Dilated right main pulmonary artery 2.7 cm   RHC/LHC 07/07/22:   Mid Cx lesion is 30% stenosed.   Ost  LAD to Prox LAD lesion is 20% stenosed.   Mid LAD lesion is 30% stenosed.   Prox RCA lesion is 50% stenosed.   Mild non-obstructive CAD Mild elevation right heart pressures PCWP 10 mmHg Mean PA pressure 33 Severe aortic stenosis by echo (I did not cross the aortic valve today).    Recommendations: Will continue with workup for TAVR.    Echo 05/15/22: IMPRESSIONS   1. Left ventricular ejection fraction, by estimation, is 60 to 65%. The  left ventricle has normal function. The left ventricle has no regional  wall motion abnormalities. Left ventricular diastolic parameters are  consistent with Grade I diastolic  dysfunction (impaired relaxation). Elevated left atrial pressure.   2. Right ventricular systolic function is normal. The right ventricular  size is normal. There is mildly elevated pulmonary artery systolic  pressure.   3. Left atrial size was severely dilated.   4. The mitral valve is normal in structure. Trivial mitral valve  regurgitation. No evidence of mitral stenosis.   5. The tricuspid valve is abnormal.   6. The aortic valve has an indeterminant number of cusps. There is  moderate calcification of the aortic valve. There is moderate thickening  of the aortic valve. Aortic valve regurgitation is not visualized. Severe  aortic valve stenosis. Aortic valve  mean gradient measures 45.7 mmHg. Aortic valve peak gradient measures 78.5  mmHg. Aortic valve area, by VTI measures 0.64 cm.   7. The inferior vena cava is normal in size with greater than 50%  respiratory variability, suggesting right atrial pressure of 3 mmHg.   US Carotid 08/23/19: IMPRESSION: Minimal amount of bilateral atherosclerotic plaque, left subjectively greater than right, not resulting in a hemodynamically significant stenosis within either internal carotid artery on this body habitus degraded examination.    Past Medical History:  Diagnosis Date   Anxiety    Arthritis    Cancer (HCC)     face- removed in office   Crohn disease (HCC)    Depression    DVT (deep venous thrombosis) (HCC) 07/27/2017   LLE   GERD (gastroesophageal reflux disease)    HTN (hypertension)    Hyperlipidemia    Neuropathy    Severe aortic stenosis    Varicose veins of bilateral lower extremities with other complications     Past Surgical History:  Procedure Laterality Date   BOWEL RESECTION  1970   EYE SURGERY Bilateral    cataract   HIP ARTHROPLASTY Left 06/06/2016   Procedure: ARTHROPLASTY BIPOLAR HIP (HEMIARTHROPLASTY);  Surgeon: Vickki Hearing, MD;  Location: [facility];  Service: Orthopedics;  Laterality: Left;   RIGHT HEART CATH AND CORONARY ANGIOGRAPHY N/A 07/07/2022   Procedure: RIGHT HEART CATH AND CORONARY ANGIOGRAPHY;  Surgeon: Kathleene Hazel, MD;  Location: [facility] INVASIVE CV LAB;  Service: Cardiovascular;  Laterality: N/A;    MEDICATIONS: No current facility-administered medications for this encounter.    acetaminophen (TYLENOL) 500 MG tablet   albuterol (VENTOLIN HFA) 108 (90 Base) MCG/ACT inhaler   ALPRAZolam (XANAX) 0.25 MG tablet   Ascorbic Acid (VITAMIN C) 1000 MG tablet   aspirin EC 81 MG tablet   b complex vitamins tablet  CALCIUM PO   Cholecalciferol (VITAMIN D3) 50 MCG (2000 UT) capsule   cyanocobalamin (VITAMIN B12) 1000 MCG tablet   furosemide (LASIX) 20 MG tablet   gabapentin (NEURONTIN) 100 MG capsule   Multiple Vitamins-Minerals (HAIR SKIN & NAILS) TABS   Multiple Vitamins-Minerals (MULTIVITAMIN WITH MINERALS) tablet   neomycin-bacitracin-polymyxin (NEOSPORIN) OINT   olmesartan (BENICAR) 40 MG tablet   pantoprazole (PROTONIX) 40 MG tablet   Psyllium (METAMUCIL PO)   sertraline (ZOLOFT) 100 MG tablet   simvastatin (ZOCOR) 10 MG tablet   solifenacin (VESICARE) 10 MG tablet   sulfaSALAzine (AZULFIDINE) 500 MG tablet   vitamin E 400 UNIT capsule  Last ASA 08/29/22.   Shonna Chock, PA-C Surgical Short Stay/Anesthesiology [facility] Phone [phone] [facility] Phone [phone] 08/29/2022 2:45 PM

## 2022-08-29 NOTE — Anesthesia Preprocedure Evaluation (Signed)
Anesthesia Evaluation  Patient identified by MRN, date of birth, ID band Patient awake    Reviewed: Allergy & Precautions, NPO status , Patient's Chart, lab work & pertinent test results  History of Anesthesia Complications Negative for: history of anesthetic complications  Airway Mallampati: IV  TM Distance: >3 FB Neck ROM: Full    Dental  (+) Upper Dentures, Lower Dentures   Pulmonary former smoker   Pulmonary exam normal        Cardiovascular hypertension, Pt. on medications + DVT  + Valvular Problems/Murmurs AS  Rhythm:Regular Rate:Bradycardia + Systolic murmurs  '24 Cath -   Mid Cx lesion is 30% stenosed.   Ost LAD to Prox LAD lesion is 20% stenosed.   Mid LAD lesion is 30% stenosed.   Prox RCA lesion is 50% stenosed. Mild non-obstructive CAD Mild elevation right heart pressures PCWP 10 mmHg Mean PA pressure 33 Severe aortic stenosis by echo (I did not cross the aortic valve today).   '24 TTE - EF 60 to 65%. Grade I diastolic dysfunction (impaired relaxation). There is mildly elevated pulmonary artery systolic pressure. Left atrial size was severely dilated. Trivial mitral valve regurgitation. Severe aortic valve stenosis. Aortic valve mean gradient measures 45.7 mmHg. Aortic valve peak gradient measures 78.5 mmHg. Aortic valve area, by VTI measures 0.64 cm.      Neuro/Psych  PSYCHIATRIC DISORDERS Anxiety Depression     Neuromuscular disease    GI/Hepatic Neg liver ROS,GERD  Medicated and Controlled,, Crohn's disease    Endo/Other  negative endocrine ROS    Renal/GU negative Renal ROS     Musculoskeletal  (+) Arthritis ,    Abdominal   Peds  Hematology negative hematology ROS (+)   Anesthesia Other Findings   Reproductive/Obstetrics                              Anesthesia Physical Anesthesia Plan  ASA: 4  Anesthesia Plan: General   Post-op Pain Management:  Tylenol PO (pre-op)*   Induction: Intravenous  PONV Risk Score and Plan: 3 and Treatment may vary due to age or medical condition and Ondansetron  Airway Management Planned: Oral ETT  Additional Equipment: ClearSight  Intra-op Plan:   Post-operative Plan: Extubation in OR  Informed Consent: I have reviewed the patients History and Physical, chart, labs and discussed the procedure including the risks, benefits and alternatives for the proposed anesthesia with the patient or authorized representative who has indicated his/her understanding and acceptance.     Dental advisory given  Plan Discussed with: CRNA and Anesthesiologist  Anesthesia Plan Comments: (See PAT note. Patient with severe AS, being considered for TAVR. Per 08/11/22 Preoperative Evaluation visit with Jacolyn Reedy, PA-C, "Lung nodule needs biopsy before surgery-higher risk for anesthesia with severe AS but needs to be done before TAVR. Dr. Eden Emms and Dr. Delton Coombes have spoken and cleared her for lung biopsy. According to the Revised Cardiac Risk Index (RCRI), her Perioperative Risk of Major Cardiac Event is (%): 0.9 Her Functional Capacity in METs is: 4.73 according to the [facility] Activity Status Index (DASI).")        Anesthesia Quick Evaluation

## 2022-09-01 ENCOUNTER — Ambulatory Visit (HOSPITAL_COMMUNITY)
Admission: RE | Admit: 2022-09-01 | Discharge: 2022-09-01 | Disposition: A | Discharge status: Home / Self Care | Payer: Medicare Other | Attending: Emergency Medicine | Admitting: Emergency Medicine

## 2022-09-01 ENCOUNTER — Ambulatory Visit (HOSPITAL_COMMUNITY): Payer: Medicare Other

## 2022-09-01 ENCOUNTER — Ambulatory Visit (HOSPITAL_BASED_OUTPATIENT_CLINIC_OR_DEPARTMENT_OTHER): Payer: Medicare Other | Admitting: Vascular Surgery

## 2022-09-01 ENCOUNTER — Other Ambulatory Visit: Payer: Self-pay

## 2022-09-01 ENCOUNTER — Encounter (HOSPITAL_COMMUNITY)
Admission: RE | Disposition: A | Discharge status: Home / Self Care | Payer: Self-pay | Source: Home / Self Care | Attending: Emergency Medicine

## 2022-09-01 ENCOUNTER — Ambulatory Visit (HOSPITAL_COMMUNITY): Payer: Medicare Other | Admitting: Vascular Surgery

## 2022-09-01 ENCOUNTER — Encounter (HOSPITAL_COMMUNITY): Payer: Self-pay | Admitting: Emergency Medicine

## 2022-09-01 DIAGNOSIS — E785 Hyperlipidemia, unspecified: Secondary | ICD-10-CM | POA: Diagnosis not present

## 2022-09-01 DIAGNOSIS — Z79899 Other long term (current) drug therapy: Secondary | ICD-10-CM | POA: Diagnosis not present

## 2022-09-01 DIAGNOSIS — F418 Other specified anxiety disorders: Secondary | ICD-10-CM

## 2022-09-01 DIAGNOSIS — R911 Solitary pulmonary nodule: Secondary | ICD-10-CM | POA: Diagnosis not present

## 2022-09-01 DIAGNOSIS — Z87891 Personal history of nicotine dependence: Secondary | ICD-10-CM | POA: Insufficient documentation

## 2022-09-01 DIAGNOSIS — F419 Anxiety disorder, unspecified: Secondary | ICD-10-CM | POA: Diagnosis not present

## 2022-09-01 DIAGNOSIS — K219 Gastro-esophageal reflux disease without esophagitis: Secondary | ICD-10-CM | POA: Diagnosis not present

## 2022-09-01 DIAGNOSIS — I10 Essential (primary) hypertension: Secondary | ICD-10-CM | POA: Insufficient documentation

## 2022-09-01 DIAGNOSIS — I350 Nonrheumatic aortic (valve) stenosis: Secondary | ICD-10-CM | POA: Diagnosis not present

## 2022-09-01 DIAGNOSIS — I2510 Atherosclerotic heart disease of native coronary artery without angina pectoris: Secondary | ICD-10-CM | POA: Insufficient documentation

## 2022-09-01 DIAGNOSIS — K5090 Crohn's disease, unspecified, without complications: Secondary | ICD-10-CM | POA: Diagnosis not present

## 2022-09-01 DIAGNOSIS — I700 Atherosclerosis of aorta: Secondary | ICD-10-CM | POA: Diagnosis not present

## 2022-09-01 DIAGNOSIS — G709 Myoneural disorder, unspecified: Secondary | ICD-10-CM | POA: Diagnosis not present

## 2022-09-01 DIAGNOSIS — F32A Depression, unspecified: Secondary | ICD-10-CM | POA: Diagnosis not present

## 2022-09-01 DIAGNOSIS — Z86718 Personal history of other venous thrombosis and embolism: Secondary | ICD-10-CM | POA: Insufficient documentation

## 2022-09-01 DIAGNOSIS — K5730 Diverticulosis of large intestine without perforation or abscess without bleeding: Secondary | ICD-10-CM | POA: Diagnosis not present

## 2022-09-01 DIAGNOSIS — M1990 Unspecified osteoarthritis, unspecified site: Secondary | ICD-10-CM | POA: Insufficient documentation

## 2022-09-01 DIAGNOSIS — K760 Fatty (change of) liver, not elsewhere classified: Secondary | ICD-10-CM | POA: Diagnosis not present

## 2022-09-01 DIAGNOSIS — R846 Abnormal cytological findings in specimens from respiratory organs and thorax: Secondary | ICD-10-CM | POA: Diagnosis not present

## 2022-09-01 HISTORY — DX: Gastro-esophageal reflux disease without esophagitis: K21.9

## 2022-09-01 HISTORY — PX: BRONCHIAL NEEDLE ASPIRATION BIOPSY: SHX5106

## 2022-09-01 HISTORY — DX: Malignant (primary) neoplasm, unspecified: C80.1

## 2022-09-01 HISTORY — PX: FIDUCIAL MARKER PLACEMENT: SHX6858

## 2022-09-01 HISTORY — PX: BRONCHIAL BRUSHINGS: SHX5108

## 2022-09-01 HISTORY — PX: BRONCHIAL BIOPSY: SHX5109

## 2022-09-01 LAB — CBC
HCT: 39.7 % (ref 36.0–46.0)
Hemoglobin: 12.7 g/dL (ref 12.0–15.0)
MCH: 29.3 pg (ref 26.0–34.0)
MCHC: 32 g/dL (ref 30.0–36.0)
MCV: 91.5 fL (ref 80.0–100.0)
Platelets: 138 10*3/uL — ABNORMAL LOW (ref 150–400)
RBC: 4.34 MIL/uL (ref 3.87–5.11)
RDW: 12.9 % (ref 11.5–15.5)
WBC: 7.3 10*3/uL (ref 4.0–10.5)
nRBC: 0 % (ref 0.0–0.2)

## 2022-09-01 LAB — BASIC METABOLIC PANEL
Anion gap: 10 (ref 5–15)
BUN: 19 mg/dL (ref 8–23)
CO2: 27 mmol/L (ref 22–32)
Calcium: 9.1 mg/dL (ref 8.9–10.3)
Chloride: 101 mmol/L (ref 98–111)
Creatinine, Ser: 0.75 mg/dL (ref 0.44–1.00)
GFR, Estimated: >60 mL/min (ref >60)
Glucose, Bld: 98 mg/dL (ref 70–99)
Potassium: 3.7 mmol/L (ref 3.5–5.1)
Sodium: 138 mmol/L (ref 135–145)

## 2022-09-01 SURGERY — BRONCHOSCOPY, WITH BIOPSY USING ELECTROMAGNETIC NAVIGATION
Anesthesia: General | Laterality: Right

## 2022-09-01 MED ORDER — CHLORHEXIDINE GLUCONATE 0.12 % MT SOLN
15.0000 mL | Freq: Once | OROMUCOSAL | Status: AC
  Administered 2022-09-01: 15 mL via OROMUCOSAL
  Filled 2022-09-01: qty 15

## 2022-09-01 MED ORDER — FENTANYL CITRATE (PF) 100 MCG/2ML IJ SOLN: 25.0000 ug | INTRAMUSCULAR | Status: DC | PRN

## 2022-09-01 MED ORDER — PROPOFOL 500 MG/50ML IV EMUL
INTRAVENOUS | Status: DC | PRN
  Administered 2022-09-01: 140 ug/kg/min via INTRAVENOUS
  Administered 2022-09-01: 60 ug/kg/min via INTRAVENOUS

## 2022-09-01 MED ORDER — OXYCODONE HCL 5 MG/5ML PO SOLN: 5.0000 mg | Freq: Once | ORAL | Status: DC | PRN

## 2022-09-01 MED ORDER — LIDOCAINE 2% (20 MG/ML) 5 ML SYRINGE
INTRAMUSCULAR | Status: DC | PRN
  Administered 2022-09-01: 60 mg via INTRAVENOUS

## 2022-09-01 MED ORDER — ONDANSETRON HCL 4 MG/2ML IJ SOLN
INTRAMUSCULAR | Status: DC | PRN
  Administered 2022-09-01: 4 mg via INTRAVENOUS

## 2022-09-01 MED ORDER — PHENYLEPHRINE HCL-NACL 20-0.9 MG/250ML-% IV SOLN
INTRAVENOUS | Status: DC | PRN
  Administered 2022-09-01: 40 ug/min via INTRAVENOUS

## 2022-09-01 MED ORDER — FENTANYL CITRATE (PF) 100 MCG/2ML IJ SOLN
INTRAMUSCULAR | Status: DC | PRN
  Administered 2022-09-01: 25 ug via INTRAVENOUS
  Administered 2022-09-01: 50 ug via INTRAVENOUS

## 2022-09-01 MED ORDER — ACETAMINOPHEN 500 MG PO TABS: 1000.0000 mg | ORAL_TABLET | Freq: Once | ORAL | Status: DC

## 2022-09-01 MED ORDER — SUGAMMADEX SODIUM 200 MG/2ML IV SOLN
INTRAVENOUS | Status: DC | PRN
  Administered 2022-09-01: 200 mg via INTRAVENOUS

## 2022-09-01 MED ORDER — PROPOFOL 10 MG/ML IV BOLUS
INTRAVENOUS | Status: DC | PRN
  Administered 2022-09-01: 10 mg via INTRAVENOUS
  Administered 2022-09-01: 5 mg via INTRAVENOUS
  Administered 2022-09-01: 40 mg via INTRAVENOUS
  Administered 2022-09-01: 5 mg via INTRAVENOUS

## 2022-09-01 MED ORDER — ROCURONIUM BROMIDE 10 MG/ML (PF) SYRINGE
PREFILLED_SYRINGE | INTRAVENOUS | Status: DC | PRN
  Administered 2022-09-01: 50 mg via INTRAVENOUS

## 2022-09-01 MED ORDER — DEXAMETHASONE SODIUM PHOSPHATE 10 MG/ML IJ SOLN
INTRAMUSCULAR | Status: DC | PRN
  Administered 2022-09-01: 5 mg via INTRAVENOUS

## 2022-09-01 MED ORDER — PHENYLEPHRINE 80 MCG/ML (10ML) SYRINGE FOR IV PUSH (FOR BLOOD PRESSURE SUPPORT)
PREFILLED_SYRINGE | INTRAVENOUS | Status: DC | PRN
  Administered 2022-09-01: 80 ug via INTRAVENOUS

## 2022-09-01 MED ORDER — ONDANSETRON HCL 4 MG/2ML IJ SOLN: 4.0000 mg | Freq: Once | INTRAMUSCULAR | Status: DC | PRN

## 2022-09-01 MED ORDER — GABAPENTIN 100 MG PO CAPS: 100.0000 mg | ORAL_CAPSULE | Freq: Two times a day (BID) | ORAL | Status: AC

## 2022-09-01 MED ORDER — LACTATED RINGERS IV SOLN: INTRAVENOUS | Status: DC

## 2022-09-01 MED ORDER — OXYCODONE HCL 5 MG PO TABS: 5.0000 mg | ORAL_TABLET | Freq: Once | ORAL | Status: DC | PRN

## 2022-09-01 SURGICAL SUPPLY — 1 items: fiducial IMPLANT

## 2022-09-01 NOTE — Interval H&P Note (Signed)
History and Physical Interval Note:  09/01/2022 8:57 AM  Barbara Lucero  has presented today for surgery, with the diagnosis of RIGHT UPPER LOBE NODULE.  The various methods of treatment have been discussed with the patient and family. After consideration of risks, benefits and other options for treatment, the patient has consented to  Procedure(s): ROBOTIC ASSISTED NAVIGATIONAL BRONCHOSCOPY (Right) as a surgical intervention.  The patient's history has been reviewed, patient examined, no change in status, stable for surgery.  I have reviewed the patient's chart and labs.  Questions were answered to the patient's satisfaction.     Leslye Peer

## 2022-09-01 NOTE — Research (Signed)
Title: A multi-center, prospective, single-arm, observational study to evaluate real-world outcomes for the shape-sensing Ion endoluminal system  Primary Outcome: Evaluate procedure characteristics and short and long-term patient outcomes following shape-sensing robotic-assisted bronchoscopy (ssRAB) utilizing the Ion Endoluminal System for lung lesion localization or biopsy.   Protocol # / Study Name: ISI-ION-003 Clinical Trials #: [id number] Sponsor: [employer] Principal Investigator: Dr. Elige Radon Icard  Key Features of Ion Endoluminal System (referred to as "Ion") Ion is the first FDA cleared bronchoscopy system that uses fiber optic shape sensing technology to inform on location within the airways. Its catheter/tool channel has a smaller outer diameter (3.5 mm) in comparison to conventional bronchoscopes, allowing it to navigate into the smaller airways of the periphery.     Key Inclusion Criteria Subject is 18 years or older at the time of the procedure Subject is a candidate for a planned, elective RAB lung lesion localization or biopsy procedure in which the Ion Endoluminal System is planned to be utilized.  Subject  able to understand and adhere to study requirements and provide informed consent.   Key Exclusion Criteria Subject is under the care of a Museum/gallery exhibitions officer and is unable to provide informed consent on their own accord.  Subject is participating in an interventional research study or research study with investigational agents with an unknown safety profile that would interfere with participation or the results of this study.  Female subjects who are pregnant or nursing at the time of the index Ion procedure, as determined by standard site practices. Subjects that are incarcerated or institutionalized under court order, or other vulnerable populations.    Previous Clinical Trials Since receiving FDA clearance in Feb 2019, Ion has been adopted  commercially by over 226 centers in the [place], and utilized in over 40,000 procedures.  The first in-human study enrolled 68 subjects with a mean lesion size of 14.8 mm and the overall diagnostic yield was 79.3%, with no adverse events. 17 (58.6%) lesions were reported to have a bronchus sign available on CT imaging.  A multi-center study published results in 2022, with 270 lesions biopsied in 241 patients using Ion. The mean largest cardinal lesion size was 18.86.30mm, and the mean airway generation count was 7.01.6. Asymptomatic pneumothorax occurred in 3.3% of subjects, and 0.8% experienced airway bleeding.   Another study provided preliminary results in 2022, with 87% sensitivity for malignancy, a diagnostic yield of 81%, and a mean lesion size of 16 mm. 75% of biopsy cases were bronchus-sign negative. 4% of subjects experienced pneumothoraces (including those requiring intervention), and 0.8% of subjects experienced airway bleeding requiring wedging or balloon tamponade.  A single-center study captured 131 consecutive procedures of pulmonary biopsy using Ion. The navigational success rate was 98.7%, with an overall diagnostic yield of 81.7%, an overall complication rate of 3%, and a pneumothorax rate of 1.5%.    [facility] [place] Clinical Research Coordinator note:   This visit for Subject Barbara Lucero with DOB: Jan 01, 1941 on 09/01/2022 for the above protocol is Visit/Encounter # Pre-procedure, Intra-procedure and Post-procedure, and is for purpose of research.   The consent for this encounter is under:  Protocol Version 1.0 Investigator Brochure Version N/A Consent Version Revision A, dated 14Nov2023 and is currently IRB approved.   Barbara Lucero expressed continued interest and consent in continuing as a study subject. Subject confirmed that there was no change in contact information (e.g. address, telephone, email). Subject thanked for participation in research and contribution to  science. In  this visit 09/01/2022 the subject will be evaluated by Sub-Investigator named Dr. Delton Coombes. This research coordinator has verified that the above investigator is up to date with his/her training logs.   The Subject was informed that the PI continues to have oversight of the subject's visits and course through relevant discussions, reviews, and also specifically of this visit by routing of this note to the PI.  The research study was discussed with the subject in the pre-operative room. The study was explained in detail including all the contents of the informed consent document. The subject was encouraged to ask questions. All questions were answered to their satisfaction. The IRB approved informed consent was signed, and a copy was given to the subject. After obtaining consent, the subject underwent scheduled procedure using the ion endoluminal system. Data collection was completed per protocol. Refer to paper source subject binder for further details.      Signed by  Verdene Lennert Clinical Research Coordinator / Sub-Investigator  [facility]  [place], [place] 11:36 AM 09/01/2022

## 2022-09-01 NOTE — Op Note (Signed)
Video Bronchoscopy with Robotic Assisted Bronchoscopic Navigation   Date of Operation: 09/01/2022   Pre-op Diagnosis: Right upper lobe nodule  Post-op Diagnosis: Same  Surgeon: Levy Pupa  Assistants: None  Anesthesia: General endotracheal anesthesia  Operation: Flexible video fiberoptic bronchoscopy with robotic assistance and biopsies.  Estimated Blood Loss: Minimal  Complications: None  Indications and History: Barbara Lucero is a 82 y.o. female with history of tobacco use, severe aortic stenosis, hypertension, Crohn's disease.  She was found to have a slowly enlarging right upper lobe pulmonary nodule on serial imaging.  Recommendation made to achieve tissue diagnosis via robotic assisted navigational bronchoscopy. The risks, benefits, complications, treatment options and expected outcomes were discussed with the patient.  The possibilities of pneumothorax, pneumonia, reaction to medication, pulmonary aspiration, perforation of a viscus, bleeding, failure to diagnose a condition and creating a complication requiring transfusion or operation were discussed with the patient who freely signed the consent.    Description of Procedure: The patient was seen in the Preoperative Area, was examined and was deemed appropriate to proceed.  The patient was taken to [facility] endoscopy room 3.  Thank, identified as Elias Else and the procedure verified as Flexible Video Fiberoptic Bronchoscopy.  A Time Out was held and the above information confirmed.   Prior to the date of the procedure a high-resolution CT scan of the chest was performed. Utilizing ION software program a virtual tracheobronchial tree was generated to allow the creation of distinct navigation pathways to the patient's parenchymal abnormalities. After being taken to the operating room general anesthesia was initiated and the patient  was orally intubated. The video fiberoptic bronchoscope was introduced via the endotracheal tube and a  general inspection was performed which showed normal right and left lung anatomy.  The mucosa was somewhat friable with some oozing with gentle suctioning.  Aspiration of the bilateral mainstems was completed to remove any remaining secretions. Robotic catheter inserted into patient's endotracheal tube.   Target #1 right upper lobe nodule: The distinct navigation pathways prepared prior to this procedure were then utilized to navigate to patient's lesion identified on CT scan. The robotic catheter was secured into place and the vision probe was withdrawn.  Lesion location was approximated using fluoroscopy.Cios three-dimensional imaging was performed for local registration and targeting.  Under fluoroscopic guidance transbronchial needle brushings, transbronchial needle biopsies, and transbronchial forceps biopsies were performed to be sent for cytology and pathology.  Under fluoroscopic guidance a single fiducial marker was placed adjacent to the nodule.   At the end of the procedure a general airway inspection was performed and there was no evidence of active bleeding. The bronchoscope was removed.  The patient tolerated the procedure well. There was no significant blood loss and there were no obvious complications. A post-procedural chest x-ray is pending.  Samples Target #1: 1. Transbronchial needle brushings from right upper lobe nodule 2. Transbronchial Wang needle biopsies from right upper lobe nodule 3. Transbronchial forceps biopsies from right upper lobe nodule  Plans:  The patient will be discharged from the PACU to home when recovered from anesthesia and after chest x-ray is reviewed. We will review the cytology, pathology and microbiology results with the patient when they become available. Outpatient followup will be with Dr. Delton Coombes.   Levy Pupa, MD, PhD 09/01/2022, 11:39 AM [facility] [phone] or if no answer before 7:00PM call [phone] For any  issues after 7:00PM please call eLink [phone]

## 2022-09-01 NOTE — Transfer of Care (Signed)
Immediate Anesthesia Transfer of Care Note  Patient: JERNEI PADILLO  Procedure(s) Performed: ROBOTIC ASSISTED NAVIGATIONAL BRONCHOSCOPY (Right) BRONCHIAL BIOPSIES BRONCHIAL BRUSHINGS BRONCHIAL NEEDLE ASPIRATION BIOPSIES FIDUCIAL MARKER PLACEMENT  Patient Location: PACU  Anesthesia Type:General  Level of Consciousness: drowsy and patient cooperative  Airway & Oxygen Therapy: Patient Spontanous Breathing and Patient connected to face mask oxygen  Post-op Assessment: Report given to RN and Post -op Vital signs reviewed and stable  Post vital signs: Reviewed and stable  Last Vitals:  Vitals Value Taken Time  BP 114/53 09/01/22 1146  Temp 36.8 C 09/01/22 1145  Pulse 62 09/01/22 1149  Resp 10 09/01/22 1149  SpO2 95 % 09/01/22 1149  Vitals shown include unvalidated device data.  Last Pain:  Vitals:   09/01/22 0901  TempSrc:   PainSc: 0-No pain         Complications: No notable events documented.

## 2022-09-01 NOTE — Anesthesia Postprocedure Evaluation (Signed)
Anesthesia Post Note  Patient: Barbara Lucero  Procedure(s) Performed: ROBOTIC ASSISTED NAVIGATIONAL BRONCHOSCOPY (Right) BRONCHIAL BIOPSIES BRONCHIAL BRUSHINGS BRONCHIAL NEEDLE ASPIRATION BIOPSIES FIDUCIAL MARKER PLACEMENT     Patient location during evaluation: PACU Anesthesia Type: General Level of consciousness: awake and alert Pain management: pain level controlled Vital Signs Assessment: post-procedure vital signs reviewed and stable Respiratory status: spontaneous breathing, nonlabored ventilation and respiratory function stable Cardiovascular status: stable and blood pressure returned to baseline Anesthetic complications: no   No notable events documented.  Last Vitals:  Vitals:   09/01/22 1215 09/01/22 1230  BP: (!) 120/52 (!) 136/48  Pulse: 67 69  Resp: 14 15  Temp:    SpO2: 96% 96%    Last Pain:  Vitals:   09/01/22 1145  TempSrc:   PainSc: 0-No pain                 Beryle Lathe

## 2022-09-01 NOTE — Anesthesia Procedure Notes (Signed)
Procedure Name: Intubation Date/Time: 09/01/2022 10:46 AM  Performed by: Audie Pinto, CRNAPre-anesthesia Checklist: Patient identified, Emergency Drugs available, Suction available and Patient being monitored Patient Re-evaluated:Patient Re-evaluated prior to induction Oxygen Delivery Method: Circle system utilized Preoxygenation: Pre-oxygenation with 100% oxygen Induction Type: IV induction Ventilation: Mask ventilation without difficulty Laryngoscope Size: Mac and 4 Grade View: Grade I Tube type: Oral Tube size: 8.5 mm Number of attempts: 1 Airway Equipment and Method: Stylet and Oral airway Placement Confirmation: ETT inserted through vocal cords under direct vision, positive ETCO2 and breath sounds checked- equal and bilateral Secured at: 21 cm Tube secured with: Tape Dental Injury: Teeth and Oropharynx as per pre-operative assessment

## 2022-09-01 NOTE — Discharge Instructions (Signed)
Flexible Bronchoscopy, Care After This sheet gives you information about how to care for yourself after your test. Your doctor may also give you more specific instructions. If you have problems or questions, contact your doctor. Follow these instructions at home: Eating and drinking When your numbness is gone and your cough and gag reflexes have come back, you may: Eat only soft foods. Slowly drink liquids. The day after the test, go back to your normal diet. Driving Do not drive for 24 hours if you were given a medicine to help you relax (sedative). Do not drive or use heavy machinery while taking prescription pain medicine. General instructions  Take over-the-counter and prescription medicines only as told by your doctor. Return to your normal activities as told. Ask what activities are safe for you. Do not use any products that have nicotine or tobacco in them. This includes cigarettes and e-cigarettes. If you need help quitting, ask your doctor. Keep all follow-up visits as told by your doctor. This is important. It is very important if you had a tissue sample (biopsy) taken. Get help right away if: You have shortness of breath that gets worse. You get light-headed. You feel like you are going to pass out (faint). You have chest pain. You cough up: More than a little blood. More blood than before. Summary Do not eat or drink anything (not even water) for 2 hours after your test, or until your numbing medicine wears off. Do not use cigarettes. Do not use e-cigarettes. Get help right away if you have chest pain.  Please call our office for any questions or concerns.  [phone].  This information is not intended to replace advice given to you by your health care provider. Make sure you discuss any questions you have with your health care provider. Document Released: 12/08/2008 Document Revised: 01/23/2017 Document Reviewed: 02/29/2016 Elsevier Patient Education  2020 [employer].

## 2022-09-03 ENCOUNTER — Encounter (HOSPITAL_COMMUNITY): Payer: Self-pay | Admitting: Emergency Medicine

## 2022-09-03 LAB — CYTOLOGY - NON PAP

## 2022-09-05 ENCOUNTER — Telehealth: Payer: Self-pay | Admitting: Emergency Medicine

## 2022-09-05 NOTE — Telephone Encounter (Signed)
I reviewed bronchoscopy results with the patient.  She had biopsies and brushings of a right upper lobe pulmonary nodule, was read as diagnostic on Quik stain in the room but final cytology consistent with only atypical cells, IHC on the cellblock negative.  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  I will review with thoracic oncology conference, decide next steps.  She is likely a poor candidate for primary resection but would be a candidate for SBRT either with or without additional tissue depending on group consensus.  I will follow-up with her and help make plans after we have an opportunity to discuss in conference.

## 2022-09-11 ENCOUNTER — Other Ambulatory Visit: Payer: Self-pay

## 2022-09-11 ENCOUNTER — Telehealth: Payer: Self-pay | Admitting: Emergency Medicine

## 2022-09-11 DIAGNOSIS — C3490 Malignant neoplasm of unspecified part of unspecified bronchus or lung: Secondary | ICD-10-CM

## 2022-09-11 NOTE — Progress Notes (Signed)
The proposed treatment discussed in conference is for discussion purpose only and is not a binding recommendation.  The patients have not been physically examined, or presented with their treatment options.  Therefore, final treatment plans cannot be decided.  

## 2022-09-11 NOTE — Telephone Encounter (Signed)
Her case was discussed in thoracic oncology conference this morning.  Consensus was that there is enough suspicion that her slowly enlarging nodule is malignancy that we should proceed with PET scan and oncology referral.  She lives in [place] and would like to try to get the evaluation done there.  I will order a PET scan at [facility], refer her to the cancer center in [place] so they can discuss next steps.  If this is stage I disease I think she would be a good candidate for SBRT, likely not a great candidate for surgical resection given underlying lung disease and comorbidities.

## 2022-09-16 NOTE — H&P (View-Only) (Signed)
[facility]   MULTIDISCIPLINARY HEART VALVE CLINIC                                     Cardiology Office Note:    Date:  09/17/2022   ID:  Barbara Lucero, DOB [birth year], MRN [id number]  PCP:  [name], MD  [facility] Cardiologist:  Charlton Haws, MD  [facility] Electrophysiologist:  None   Referring MD: [name], MD   CC: follow up for severe AS and discuss TAVR.  History of Present Illness:    Barbara Lucero is a 81 y.o. female with a hx of anxiety, depression, Crohn's disease, prior DVT, HTN, HLD and severe aortic stenosis in the work up for TAVR.   Echo in 04/2022 showed LVEF 60-65% and severe aortic stenosis with calcified, thickened leaflets, mean gradient 45.7 mmHg, peak gradient 78.5 mmHg, AVA 0.64 cm2, DI 0.20. She saw Dr. Pearletha Alfred 06/23/22 to be considered for TAVR. Cath showed nonobstructive CAD. Pre TAVR CTs showed anatomy amendable for a 23 mm Edwards S3UR via the right TF approach. It also showed an enlarging pulmonary nodule highly suspicious for malignancy. She was evaluated by Dr. Delton Coombes and underwent navigational bronchoscopy on 09/01/22. Dr. Delton Coombes reviewed this with tumor board and it was felt best to send her for a PET scan and refer her to the cancer center.   Today the patient presents to clinic for follow up. Here with her daughter. She has a lot of fatigue and very little energy. Had a short episode of CP while working on dishes last night. Occasionally gets dyspnea on exertion. No LE edema, orthopnea or PND. No dizziness or syncope. No blood in stool or urine. No palpitations.    Past Medical History:  Diagnosis Date   Anxiety    Arthritis    Cancer (HCC)    face- removed in office   Crohn disease (HCC)    Depression    DVT (deep venous thrombosis) (HCC) 07/27/2017   LLE   GERD (gastroesophageal reflux disease)    HTN (hypertension)    Hyperlipidemia    Neuropathy    Severe aortic stenosis    Varicose veins of bilateral lower  extremities with other complications     Past Surgical History:  Procedure Laterality Date   BOWEL RESECTION  1970   BRONCHIAL BIOPSY  09/01/2022   Procedure: BRONCHIAL BIOPSIES;  Surgeon: Leslye Peer, MD;  Location: [facility] ENDOSCOPY;  Service: Pulmonary;;   BRONCHIAL BRUSHINGS  09/01/2022   Procedure: BRONCHIAL BRUSHINGS;  Surgeon: Leslye Peer, MD;  Location: [facility] ENDOSCOPY;  Service: Pulmonary;;   BRONCHIAL NEEDLE ASPIRATION BIOPSY  09/01/2022   Procedure: BRONCHIAL NEEDLE ASPIRATION BIOPSIES;  Surgeon: Leslye Peer, MD;  Location: [facility] ENDOSCOPY;  Service: Pulmonary;;   EYE SURGERY Bilateral    cataract   FIDUCIAL MARKER PLACEMENT  09/01/2022   Procedure: FIDUCIAL MARKER PLACEMENT;  Surgeon: Leslye Peer, MD;  Location: [facility] ENDOSCOPY;  Service: Pulmonary;;   HIP ARTHROPLASTY Left 06/06/2016   Procedure: ARTHROPLASTY BIPOLAR HIP (HEMIARTHROPLASTY);  Surgeon: Vickki Hearing, MD;  Location: [facility] ORS;  Service: Orthopedics;  Laterality: Left;   RIGHT HEART CATH AND CORONARY ANGIOGRAPHY N/A 07/07/2022   Procedure: RIGHT HEART CATH AND CORONARY ANGIOGRAPHY;  Surgeon: Kathleene Hazel, MD;  Location: [facility] INVASIVE CV LAB;  Service: Cardiovascular;  Laterality: N/A;    Current Medications: Current  Meds  Medication Sig   acetaminophen (TYLENOL) 500 MG tablet Take 1,000 mg by mouth 2 (two) times daily.   albuterol (VENTOLIN HFA) 108 (90 Base) MCG/ACT inhaler Inhale 2 puffs into the lungs every 4 (four) hours as needed for shortness of breath or wheezing.   ALPRAZolam (XANAX) 0.25 MG tablet Take 0.25 mg by mouth at bedtime.   Ascorbic Acid (VITAMIN C) 1000 MG tablet Take 1,000 mg by mouth.    aspirin EC 81 MG tablet Take 81 mg by mouth daily. Swallow whole.   b complex vitamins tablet Take 1 tablet by mouth daily.   CALCIUM PO Take 600 mg by mouth daily.   Cholecalciferol (VITAMIN D3) 50 MCG (2000 UT) capsule Take 2,000 Units by mouth daily.   cyanocobalamin (VITAMIN B12) 1000 MCG tablet  Take 1,000 mcg by mouth daily.   furosemide (LASIX) 20 MG tablet Take 20 mg by mouth once a week.   gabapentin (NEURONTIN) 100 MG capsule Take 1 capsule (100 mg total) by mouth 2 (two) times daily.   Multiple Vitamins-Minerals (HAIR SKIN & NAILS) TABS Take 1 tablet by mouth daily.   Multiple Vitamins-Minerals (MULTIVITAMIN WITH MINERALS) tablet Take 1 tablet by mouth daily.   neomycin-bacitracin-polymyxin (NEOSPORIN) OINT Apply 1 Application topically daily as needed for wound care. [employer] brand   olmesartan (BENICAR) 40 MG tablet Take 40 mg by mouth daily.   pantoprazole (PROTONIX) 40 MG tablet Take 40 mg by mouth daily.   Psyllium (METAMUCIL PO) Take 1 Package by mouth daily as needed (constipation).   sertraline (ZOLOFT) 100 MG tablet Take 100 mg by mouth at bedtime.   simvastatin (ZOCOR) 10 MG tablet Take 10 mg by mouth in the morning.   solifenacin (VESICARE) 10 MG tablet Take 10 mg by mouth at bedtime.   sulfaSALAzine (AZULFIDINE) 500 MG tablet Take 1 tablet (500 mg total) by mouth 2 (two) times daily.   vitamin E 400 UNIT capsule Take 400 Units by mouth daily.     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  Tobacco Use   Smoking status: Former    Current packs/day: 0.50    Average packs/day: 0.5 packs/day for 20.0 years (10.0 ttl pk-yrs)    Types: Cigarettes   Smokeless tobacco: Never  Vaping Use   Vaping status: Never Used  Substance and Sexual Activity   Alcohol use: No   Drug use: No   Sexual activity: Not Currently    Birth control/protection: Post-menopausal  Other Topics Concern   Not on file  Social History Narrative   Not on file   Social Determinants of Health   Financial Resource Strain: Not  on file  Food Insecurity: No Food Insecurity (11/23/2021)   Hunger Vital Sign    Worried About Running Out of Food in the Last Year: Never true    Ran Out of Food in the Last Year: Never true  Transportation Needs: No Transportation Needs (11/23/2021)   PRAPARE - Administrator, Civil Service (Medical): No    Lack of Transportation (Non-Medical): No  Physical Activity: Not on file  Stress: Not on file  Social Connections: Not on file  Family History: The patient's family history includes Bipolar disorder in her daughter; Breast cancer in her sister and sister; Heart attack in her father; Other in her son; Stroke in her mother.  ROS:   Please see the history of present illness.    All other systems reviewed and are negative.  EKGs/Labs/Other Studies Reviewed:    Cardiac Studies & Procedures   CARDIAC CATHETERIZATION  CARDIAC CATHETERIZATION 07/07/2022  Narrative   Mid Cx lesion is 30% stenosed.   Ost LAD to Prox LAD lesion is 20% stenosed.   Mid LAD lesion is 30% stenosed.   Prox RCA lesion is 50% stenosed.  Mild non-obstructive CAD Mild elevation right heart pressures PCWP 10 mmHg Mean PA pressure 33 Severe aortic stenosis by echo (I did not cross the aortic valve today).  Recommendations: Will continue with workup for TAVR.  Findings Coronary Findings Diagnostic  Dominance: Co-dominant  Left Anterior Descending Vessel is large. Ost LAD to Prox LAD lesion is 20% stenosed. Mid LAD lesion is 30% stenosed.  Left Circumflex Vessel is large. Mid Cx lesion is 30% stenosed.  Left Posterior Atrioventricular Artery Vessel is moderate in size.  Right Coronary Artery Vessel is small. Prox RCA lesion is 50% stenosed.  Intervention  No interventions have been documented.     ECHOCARDIOGRAM  ECHOCARDIOGRAM COMPLETE 05/15/2022  Narrative ECHOCARDIOGRAM REPORT    Patient Name:   MAYLEAH HESSER Date of Exam: 05/15/2022 Medical Rec #:  [id number]      Height:       63.0 in Accession #:    [id number]    Weight:       187.4 lb Date of Birth:  [birth year]      BSA:          1.881 m Patient Age:    81 years      BP:           137/87 mmHg Patient Gender: F             HR:           73 bpm. [facility]  Procedure: 2D Echo, 3D Echo, Color Doppler, Cardiac Doppler and Intracardiac Opacification Agent  Indications:    Aortic stenosis I35.0  History:        Patient has prior history of Echocardiogram examinations, most recent 06/18/2021. Aortic Valve Disease; Risk Factors:Non-Smoker, Diabetes and Dyslipidemia.  Sonographer:    Aron Baba Referring Phys: [id number] [address] Stade   Sonographer Comments: Suboptimal parasternal window and suboptimal subcostal window. Image acquisition challenging due to patient body habitus and Image acquisition challenging due to respiratory motion. Global longitudinal strain was attempted. IMPRESSIONS   1. Left ventricular ejection fraction, by estimation, is 60 to 65%. The left ventricle has normal function. The left ventricle has no regional wall motion abnormalities. Left ventricular diastolic parameters are consistent with Grade I diastolic dysfunction (impaired relaxation). Elevated left atrial pressure. 2. Right ventricular systolic function is normal. The right ventricular size is normal. There is mildly elevated pulmonary artery systolic pressure. 3. Left atrial size was severely dilated. 4. The mitral valve is normal in structure. Trivial mitral valve regurgitation. No evidence of mitral stenosis. 5. The tricuspid valve is abnormal. 6. The aortic valve has an indeterminant number of cusps. There is moderate calcification of the aortic valve. There is moderate thickening of the aortic valve. Aortic valve regurgitation is not visualized. Severe aortic valve stenosis. Aortic valve mean gradient measures 45.7 mmHg. Aortic valve peak  gradient measures 78.5 mmHg. Aortic valve area, by VTI measures  0.64 cm. 7. The inferior vena cava is normal in size with greater than 50% respiratory variability, suggesting right atrial pressure of 3 mmHg.  FINDINGS Left Ventricle: Left ventricular ejection fraction, by estimation, is 60 to 65%. The left ventricle has normal function. The left ventricle has no regional wall motion abnormalities. Definity contrast agent was given IV to delineate the left ventricular endocardial borders. Global longitudinal strain performed but not reported based on interpreter judgement due to suboptimal tracking. The left ventricular internal cavity size was normal in size. There is no left ventricular hypertrophy. Left ventricular diastolic parameters are consistent with Grade I diastolic dysfunction (impaired relaxation). Elevated left atrial pressure.  Right Ventricle: The right ventricular size is normal. Right vetricular wall thickness was not well visualized. Right ventricular systolic function is normal. There is mildly elevated pulmonary artery systolic pressure. The tricuspid regurgitant velocity is 2.76 m/s, and with an assumed right atrial pressure of 8 mmHg, the estimated right ventricular systolic pressure is 38.5 mmHg.  Left Atrium: Left atrial size was severely dilated.  Right Atrium: Right atrial size was normal in size.  Pericardium: There is no evidence of pericardial effusion.  Mitral Valve: The mitral valve is normal in structure. Trivial mitral valve regurgitation. No evidence of mitral valve stenosis. MV peak gradient, 8.0 mmHg. The mean mitral valve gradient is 3.0 mmHg.  Tricuspid Valve: The tricuspid valve is abnormal. Tricuspid valve regurgitation is mild . No evidence of tricuspid stenosis.  Aortic Valve: The aortic valve has an indeterminant number of cusps. There is moderate calcification of the aortic valve. There is moderate thickening of the aortic valve. There is moderate aortic valve annular calcification. Aortic valve regurgitation is  not visualized. Severe aortic stenosis is present. Aortic valve mean gradient measures 45.7 mmHg. Aortic valve peak gradient measures 78.5 mmHg. Aortic valve area, by VTI measures 0.64 cm.  Pulmonic Valve: The pulmonic valve was not well visualized. Pulmonic valve regurgitation is not visualized. No evidence of pulmonic stenosis.  Aorta: The aortic root is normal in size and structure.  Venous: The inferior vena cava is normal in size with greater than 50% respiratory variability, suggesting right atrial pressure of 3 mmHg.  IAS/Shunts: The interatrial septum was not well visualized.   LEFT VENTRICLE PLAX 2D LVIDd:         4.20 cm   Diastology LVIDs:         2.80 cm   LV e' medial:    5.11 cm/s LV PW:         1.00 cm   LV E/e' medial:  20.0 LV IVS:        1.00 cm   LV e' lateral:   8.59 cm/s LVOT diam:     2.00 cm   LV E/e' lateral: 11.9 LV SV:         71 LV SV Index:   38 LVOT Area:     3.14 cm  3D Volume EF: 3D EF:        55 % LV EDV:       140 ml LV ESV:       64 ml LV SV:        77 ml  RIGHT VENTRICLE RV S prime:     12.00 cm/s TAPSE (M-mode): 2.0 cm  LEFT ATRIUM              Index  RIGHT ATRIUM           Index LA diam:        4.50 cm  2.39 cm/m   RA Area:     16.00 cm LA Vol (A2C):   116.0 ml 61.67 ml/m  RA Volume:   40.10 ml  21.32 ml/m LA Vol (A4C):   75.4 ml  40.09 ml/m LA Biplane Vol: 95.2 ml  50.62 ml/m AORTIC VALVE                     PULMONIC VALVE AV Area (Vmax):    0.76 cm      PV Vmax:       3.56 m/s AV Area (Vmean):   0.69 cm      PV Peak grad:  50.7 mmHg AV Area (VTI):     0.64 cm AV Vmax:           443.00 cm/s AV Vmean:          317.333 cm/s AV VTI:            1.110 m AV Peak Grad:      78.5 mmHg AV Mean Grad:      45.7 mmHg LVOT Vmax:         107.00 cm/s LVOT Vmean:        69.800 cm/s LVOT VTI:          0.225 m LVOT/AV VTI ratio: 0.20  AORTA Ao Root diam: 3.00 cm Ao Asc diam:  3.10 cm  MITRAL VALVE                TRICUSPID  VALVE MV Area (PHT): 2.84 cm     TR Peak grad:   30.5 mmHg MV Peak grad:  8.0 mmHg     TR Vmax:        276.00 cm/s MV Mean grad:  3.0 mmHg MV Vmax:       1.41 m/s     SHUNTS MV Vmean:      80.7 cm/s    Systemic VTI:  0.22 m MV Decel Time: 267 msec     Systemic Diam: 2.00 cm MV E velocity: 102.00 cm/s MV A velocity: 144.00 cm/s MV E/A ratio:  0.71  Dina Rich MD Electronically signed by Dina Rich MD Signature Date/Time: 05/15/2022/1:39:25 PM    Final     CT SCANS  CT CORONARY MORPH W/CTA COR W/SCORE 07/10/2022  Addendum 07/11/2022  8:06 AM ADDENDUM REPORT: 07/11/2022 08:04  ADDENDUM: Please see separate dictation for contemporaneously obtained CTA chest, abdomen and pelvis dated 07/10/2022 for full description of relevant extracardiac findings.   Electronically Signed By: Trudie Reed M.D. On: 07/11/2022 08:04  Narrative CLINICAL DATA:  Aortic Stenosis  EXAM: Cardiac TAVR CT  TECHNIQUE: The patient was scanned on a Siemens Force 192 slice scanner. A 120 kV retrospective scan was triggered in the ascending thoracic aorta at 140 HU's. Gantry rotation speed was 250 msecs and collimation was .6 mm. No beta blockade or nitro were given. The 3D data set was reconstructed in 5% intervals of the R-R cycle. Systolic and diastolic phases were analyzed on a dedicated work station using MPR, MIP and VRT modes. The patient received 80 cc of contrast.  FINDINGS: Aortic Valve: Tri leaflet calcified with restricted leaflet motion Calcium score 1272  Aorta: No aneurysm Bovine arch moderate calcific atherosclerosis  Sino-tubular Junction: 24 mm  Ascending Thoracic Aorta: 29 mm  Aortic Arch: 24 mm  Descending  Thoracic Aorta: 26 mm  Sinus of Valsalva Measurements:  Non-coronary: 26 mm   height 17.2 mm  Right - coronary: 25 mm  height 16.8  Left -   coronary: 25.7 mm  height 18.7 mm  Coronary Artery Height above Annulus:  Left Main: 14,4 mm above  annulus  Right Coronary: 14.3 mm above annulus  Virtual Basal Annulus Measurements:  Maximum / Minimum Diameter: 21.1 mm x 20.2 mm Average diameter 21.4 mm  Perimeter: 68.1 mm  Area: 361 mm2  Coronary Arteries: Sufficient height above annulus for deployment  Optimum Fluoroscopic Angle for Delivery: LAO 3 Caudal 3 degrees  Membranous septal length 7.7 mm  IMPRESSION: 1. Tri leaflet calcified AV with restricted leaflet motion Calcium Score 1272  2. Annular area of 361 mm2 suitable for a 23 mm Sapien 3 valve Sinuses are small for a 26 mm Medtronic Evolut  3.  Optimal angiographic angle for deployment LAO 3 Caudal 3 degrees  4.  Coronary arteries sufficient height above annulus for deployment  5.  Membranous septal length 7.7 mm  6.  Dilated right main pulmonary artery 2.7 cm  Charlton Haws  Electronically Signed: By: Charlton Haws M.D. On: 07/10/2022 17:02          EKG:  EKG is NOT ordered today.   Recent Labs: 11/23/2021: ALT 11; Magnesium 2.2; TSH 1.557 09/01/2022: BUN 19; Creatinine, Ser 0.75; Hemoglobin 12.7; Platelets 138; Potassium 3.7; Sodium 138  Recent Lipid Panel No results found for: "CHOL", "TRIG", "HDL", "CHOLHDL", "VLDL", "LDLCALC", "LDLDIRECT"   Risk Assessment/Calculations:     STS SCORE Procedure Type: Isolated AVR PERIOPERATIVE OUTCOME ESTIMATE % Operative Mortality 3.3% Morbidity & Mortality 6.99% Stroke 1.58% Renal Failure 1.35% Reoperation 2.92% Prolonged Ventilation 3.34% Deep Sternal Wound Infection 0.037% Long Hospital Stay (>14 days) 4.82% Short Hospital Stay (<6 days)* 35.3%   Physical Exam:    VS:  BP 126/78   Pulse 65   Ht 5\' 4"  (1.626 m)   Wt 174 lb 9.6 oz (79.2 kg)   SpO2 96%   BMI 29.97 kg/m     Wt Readings from Last 3 Encounters:  09/17/22 174 lb 9.6 oz (79.2 kg)  09/01/22 174 lb 6.1 oz (79.1 kg)  08/11/22 174 lb 9.6 oz (79.2 kg)     GEN: Well nourished, well developed in no acute distress HEENT:  Normal NECK: No JVD LYMPHATICS: No lymphadenopathy CARDIAC: RRR, 3/6 harsh SEM heard best at LUSB. No rubs, gallops RESPIRATORY:  Clear to auscultation without rales, wheezing or rhonchi  ABDOMEN: Soft, non-tender, non-distended MUSCULOSKELETAL:  Trace bilateral pretibial edema; No deformity  SKIN: Warm and dry NEUROLOGIC:  Alert and oriented x 3 PSYCHIATRIC:  Normal affect   ASSESSMENT:    1. Severe aortic stenosis   2. Essential hypertension   3. Hyperlipidemia, unspecified hyperlipidemia type   4. History of DVT (deep vein thrombosis)   5. Lung nodule    PLAN:    In order of problems listed above:  Severe AS: plan for TAVR with a 23 mm Edwards S3UR via the right TF approach on 8/13 with Dr. Clifton James & Dr. Leafy Ro. She has NYHA class II symptoms, mostly of fatigue. KCCQ and completed today. Instruction letter given to pt.   HTN: BP well controlled. No changes made  HLD: continue statin   Previous DVT: no longer on OAC.  Pulmonary nodule: found on pre TAVR CT and suspicious for malignancy. Set up for PET scan and evaluation with Dr. Gwenyth Bouillon. Per Dr.  Byrum, if this is stage I disease I think she would be a good candidate for SBRT, likely not a great candidate for surgical resection given underlying lung disease and comorbidities.    Medication Adjustments/Labs and Tests Ordered: Current medicines are reviewed at length with the patient today.  Concerns regarding medicines are outlined above.  No orders of the defined types were placed in this encounter.  No orders of the defined types were placed in this encounter.   Patient Instructions  Medication Instructions:  Your physician recommends that you continue on your current medications as directed. Please refer to the Current Medication list given to you today.  *If you need a refill on your cardiac medications before your next appointment, please call your pharmacy*   Lab Work: NONE If you have labs (blood work)  drawn today and your tests are completely normal, you will receive your results only by: MyChart Message (if you have MyChart) OR A paper copy in the mail If you have any lab test that is abnormal or we need to change your treatment, we will call you to review the results.   Testing/Procedures: NONE   Follow-Up: At [facility], you and your health needs are our priority.  As part of our continuing mission to provide you with exceptional heart care, we have created designated Provider Care Teams.  These Care Teams include your primary Cardiologist (physician) and Advanced Practice Providers (APPs -  Physician Assistants and Nurse Practitioners) who all work together to provide you with the care you need, when you need it.  We recommend signing up for the patient portal called "MyChart".  Sign up information is provided on this After Visit Summary.  MyChart is used to connect with patients for Virtual Visits (Telemedicine).  Patients are able to view lab/test results, encounter notes, upcoming appointments, etc.  Non-urgent messages can be sent to your provider as well.   To learn more about what you can do with MyChart, go to [email or web address].    Your next appointment:   KEEP SCHEDULED FOLLOW-UP   Signed, Cline Crock, PA-C  09/17/2022 3:10 PM    [facility]

## 2022-09-16 NOTE — Progress Notes (Unsigned)
[facility]   MULTIDISCIPLINARY HEART VALVE CLINIC                                     Cardiology Office Note:    Date:  09/17/2022   ID:  Barbara Lucero, DOB 12-31-1940, MRN [id number]  PCP:  [name], MD  [facility] Cardiologist:  Charlton Haws, MD  [facility] Electrophysiologist:  None   Referring MD: [name], MD   CC: follow up for severe AS and discuss TAVR.  History of Present Illness:    Barbara Lucero is a 82 y.o. female with a hx of anxiety, depression, Crohn's disease, prior DVT, HTN, HLD and severe aortic stenosis in the work up for TAVR.   Echo in 04/2022 showed LVEF 60-65% and severe aortic stenosis with calcified, thickened leaflets, mean gradient 45.7 mmHg, peak gradient 78.5 mmHg, AVA 0.64 cm2, DI 0.20. She saw Dr. Pearletha Alfred 06/23/22 to be considered for TAVR. Cath showed nonobstructive CAD. Pre TAVR CTs showed anatomy amendable for a 23 mm Edwards S3UR via the right TF approach. It also showed an enlarging pulmonary nodule highly suspicious for malignancy. She was evaluated by Dr. Delton Coombes and underwent navigational bronchoscopy on 09/01/22. Dr. Delton Coombes reviewed this with tumor board and it was felt best to send her for a PET scan and refer her to the cancer center.   Today the patient presents to clinic for follow up. Here with her daughter. She has a lot of fatigue and very little energy. Had a short episode of CP while working on dishes last night. Occasionally gets dyspnea on exertion. No LE edema, orthopnea or PND. No dizziness or syncope. No blood in stool or urine. No palpitations.    Past Medical History:  Diagnosis Date   Anxiety    Arthritis    Cancer (HCC)    face- removed in office   Crohn disease (HCC)    Depression    DVT (deep venous thrombosis) (HCC) 07/27/2017   LLE   GERD (gastroesophageal reflux disease)    HTN (hypertension)    Hyperlipidemia    Neuropathy    Severe aortic stenosis    Varicose veins of bilateral lower  extremities with other complications     Past Surgical History:  Procedure Laterality Date   BOWEL RESECTION  1970   BRONCHIAL BIOPSY  09/01/2022   Procedure: BRONCHIAL BIOPSIES;  Surgeon: Leslye Peer, MD;  Location: [facility] ENDOSCOPY;  Service: Pulmonary;;   BRONCHIAL BRUSHINGS  09/01/2022   Procedure: BRONCHIAL BRUSHINGS;  Surgeon: Leslye Peer, MD;  Location: [facility] ENDOSCOPY;  Service: Pulmonary;;   BRONCHIAL NEEDLE ASPIRATION BIOPSY  09/01/2022   Procedure: BRONCHIAL NEEDLE ASPIRATION BIOPSIES;  Surgeon: Leslye Peer, MD;  Location: [facility] ENDOSCOPY;  Service: Pulmonary;;   EYE SURGERY Bilateral    cataract   FIDUCIAL MARKER PLACEMENT  09/01/2022   Procedure: FIDUCIAL MARKER PLACEMENT;  Surgeon: Leslye Peer, MD;  Location: [facility] ENDOSCOPY;  Service: Pulmonary;;   HIP ARTHROPLASTY Left 06/06/2016   Procedure: ARTHROPLASTY BIPOLAR HIP (HEMIARTHROPLASTY);  Surgeon: Vickki Hearing, MD;  Location: [facility] ORS;  Service: Orthopedics;  Laterality: Left;   RIGHT HEART CATH AND CORONARY ANGIOGRAPHY N/A 07/07/2022   Procedure: RIGHT HEART CATH AND CORONARY ANGIOGRAPHY;  Surgeon: Kathleene Hazel, MD;  Location: [facility] INVASIVE CV LAB;  Service: Cardiovascular;  Laterality: N/A;    Current Medications: Current  Meds  Medication Sig   acetaminophen (TYLENOL) 500 MG tablet Take 1,000 mg by mouth 2 (two) times daily.   albuterol (VENTOLIN HFA) 108 (90 Base) MCG/ACT inhaler Inhale 2 puffs into the lungs every 4 (four) hours as needed for shortness of breath or wheezing.   ALPRAZolam (XANAX) 0.25 MG tablet Take 0.25 mg by mouth at bedtime.   Ascorbic Acid (VITAMIN C) 1000 MG tablet Take 1,000 mg by mouth.    aspirin EC 81 MG tablet Take 81 mg by mouth daily. Swallow whole.   b complex vitamins tablet Take 1 tablet by mouth daily.   CALCIUM PO Take 600 mg by mouth daily.   Cholecalciferol (VITAMIN D3) 50 MCG (2000 UT) capsule Take 2,000 Units by mouth daily.   cyanocobalamin (VITAMIN B12) 1000 MCG tablet  Take 1,000 mcg by mouth daily.   furosemide (LASIX) 20 MG tablet Take 20 mg by mouth once a week.   gabapentin (NEURONTIN) 100 MG capsule Take 1 capsule (100 mg total) by mouth 2 (two) times daily.   Multiple Vitamins-Minerals (HAIR SKIN & NAILS) TABS Take 1 tablet by mouth daily.   Multiple Vitamins-Minerals (MULTIVITAMIN WITH MINERALS) tablet Take 1 tablet by mouth daily.   neomycin-bacitracin-polymyxin (NEOSPORIN) OINT Apply 1 Application topically daily as needed for wound care. [employer] brand   olmesartan (BENICAR) 40 MG tablet Take 40 mg by mouth daily.   pantoprazole (PROTONIX) 40 MG tablet Take 40 mg by mouth daily.   Psyllium (METAMUCIL PO) Take 1 Package by mouth daily as needed (constipation).   sertraline (ZOLOFT) 100 MG tablet Take 100 mg by mouth at bedtime.   simvastatin (ZOCOR) 10 MG tablet Take 10 mg by mouth in the morning.   solifenacin (VESICARE) 10 MG tablet Take 10 mg by mouth at bedtime.   sulfaSALAzine (AZULFIDINE) 500 MG tablet Take 1 tablet (500 mg total) by mouth 2 (two) times daily.   vitamin E 400 UNIT capsule Take 400 Units by mouth daily.     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  Tobacco Use   Smoking status: Former    Current packs/day: 0.50    Average packs/day: 0.5 packs/day for 20.0 years (10.0 ttl pk-yrs)    Types: Cigarettes   Smokeless tobacco: Never  Vaping Use   Vaping status: Never Used  Substance and Sexual Activity   Alcohol use: No   Drug use: No   Sexual activity: Not Currently    Birth control/protection: Post-menopausal  Other Topics Concern   Not on file  Social History Narrative   Not on file   Social Determinants of Health   Financial Resource Strain: Not  on file  Food Insecurity: No Food Insecurity (11/23/2021)   Hunger Vital Sign    Worried About Running Out of Food in the Last Year: Never true    Ran Out of Food in the Last Year: Never true  Transportation Needs: No Transportation Needs (11/23/2021)   PRAPARE - Administrator, Civil Service (Medical): No    Lack of Transportation (Non-Medical): No  Physical Activity: Not on file  Stress: Not on file  Social Connections: Not on file  Family History: The patient's family history includes Bipolar disorder in her daughter; Breast cancer in her sister and sister; Heart attack in her father; Other in her son; Stroke in her mother.  ROS:   Please see the history of present illness.    All other systems reviewed and are negative.  EKGs/Labs/Other Studies Reviewed:    Cardiac Studies & Procedures   CARDIAC CATHETERIZATION  CARDIAC CATHETERIZATION 07/07/2022  Narrative   Mid Cx lesion is 30% stenosed.   Ost LAD to Prox LAD lesion is 20% stenosed.   Mid LAD lesion is 30% stenosed.   Prox RCA lesion is 50% stenosed.  Mild non-obstructive CAD Mild elevation right heart pressures PCWP 10 mmHg Mean PA pressure 33 Severe aortic stenosis by echo (I did not cross the aortic valve today).  Recommendations: Will continue with workup for TAVR.  Findings Coronary Findings Diagnostic  Dominance: Co-dominant  Left Anterior Descending Vessel is large. Ost LAD to Prox LAD lesion is 20% stenosed. Mid LAD lesion is 30% stenosed.  Left Circumflex Vessel is large. Mid Cx lesion is 30% stenosed.  Left Posterior Atrioventricular Artery Vessel is moderate in size.  Right Coronary Artery Vessel is small. Prox RCA lesion is 50% stenosed.  Intervention  No interventions have been documented.     ECHOCARDIOGRAM  ECHOCARDIOGRAM COMPLETE 05/15/2022  Narrative ECHOCARDIOGRAM REPORT    Patient Name:   Barbara Lucero Date of Exam: 05/15/2022 Medical Rec #:  [id number]      Height:       63.0 in Accession #:    [id number]    Weight:       187.4 lb Date of Birth:  1940-03-10      BSA:          1.881 m Patient Age:    81 years      BP:           137/87 mmHg Patient Gender: F             HR:           73 bpm. [facility]  Procedure: 2D Echo, 3D Echo, Color Doppler, Cardiac Doppler and Intracardiac Opacification Agent  Indications:    Aortic stenosis I35.0  History:        Patient has prior history of Echocardiogram examinations, most recent 06/18/2021. Aortic Valve Disease; Risk Factors:Non-Smoker, Diabetes and Dyslipidemia.  Sonographer:    Aron Baba Referring Phys: [id number] Wendall Stade   Sonographer Comments: Suboptimal parasternal window and suboptimal subcostal window. Image acquisition challenging due to patient body habitus and Image acquisition challenging due to respiratory motion. Global longitudinal strain was attempted. IMPRESSIONS   1. Left ventricular ejection fraction, by estimation, is 60 to 65%. The left ventricle has normal function. The left ventricle has no regional wall motion abnormalities. Left ventricular diastolic parameters are consistent with Grade I diastolic dysfunction (impaired relaxation). Elevated left atrial pressure. 2. Right ventricular systolic function is normal. The right ventricular size is normal. There is mildly elevated pulmonary artery systolic pressure. 3. Left atrial size was severely dilated. 4. The mitral valve is normal in structure. Trivial mitral valve regurgitation. No evidence of mitral stenosis. 5. The tricuspid valve is abnormal. 6. The aortic valve has an indeterminant number of cusps. There is moderate calcification of the aortic valve. There is moderate thickening of the aortic valve. Aortic valve regurgitation is not visualized. Severe aortic valve stenosis. Aortic valve mean gradient measures 45.7 mmHg. Aortic valve peak  gradient measures 78.5 mmHg. Aortic valve area, by VTI measures  0.64 cm. 7. The inferior vena cava is normal in size with greater than 50% respiratory variability, suggesting right atrial pressure of 3 mmHg.  FINDINGS Left Ventricle: Left ventricular ejection fraction, by estimation, is 60 to 65%. The left ventricle has normal function. The left ventricle has no regional wall motion abnormalities. Definity contrast agent was given IV to delineate the left ventricular endocardial borders. Global longitudinal strain performed but not reported based on interpreter judgement due to suboptimal tracking. The left ventricular internal cavity size was normal in size. There is no left ventricular hypertrophy. Left ventricular diastolic parameters are consistent with Grade I diastolic dysfunction (impaired relaxation). Elevated left atrial pressure.  Right Ventricle: The right ventricular size is normal. Right vetricular wall thickness was not well visualized. Right ventricular systolic function is normal. There is mildly elevated pulmonary artery systolic pressure. The tricuspid regurgitant velocity is 2.76 m/s, and with an assumed right atrial pressure of 8 mmHg, the estimated right ventricular systolic pressure is 38.5 mmHg.  Left Atrium: Left atrial size was severely dilated.  Right Atrium: Right atrial size was normal in size.  Pericardium: There is no evidence of pericardial effusion.  Mitral Valve: The mitral valve is normal in structure. Trivial mitral valve regurgitation. No evidence of mitral valve stenosis. MV peak gradient, 8.0 mmHg. The mean mitral valve gradient is 3.0 mmHg.  Tricuspid Valve: The tricuspid valve is abnormal. Tricuspid valve regurgitation is mild . No evidence of tricuspid stenosis.  Aortic Valve: The aortic valve has an indeterminant number of cusps. There is moderate calcification of the aortic valve. There is moderate thickening of the aortic valve. There is moderate aortic valve annular calcification. Aortic valve regurgitation is  not visualized. Severe aortic stenosis is present. Aortic valve mean gradient measures 45.7 mmHg. Aortic valve peak gradient measures 78.5 mmHg. Aortic valve area, by VTI measures 0.64 cm.  Pulmonic Valve: The pulmonic valve was not well visualized. Pulmonic valve regurgitation is not visualized. No evidence of pulmonic stenosis.  Aorta: The aortic root is normal in size and structure.  Venous: The inferior vena cava is normal in size with greater than 50% respiratory variability, suggesting right atrial pressure of 3 mmHg.  IAS/Shunts: The interatrial septum was not well visualized.   LEFT VENTRICLE PLAX 2D LVIDd:         4.20 cm   Diastology LVIDs:         2.80 cm   LV e' medial:    5.11 cm/s LV PW:         1.00 cm   LV E/e' medial:  20.0 LV IVS:        1.00 cm   LV e' lateral:   8.59 cm/s LVOT diam:     2.00 cm   LV E/e' lateral: 11.9 LV SV:         71 LV SV Index:   38 LVOT Area:     3.14 cm  3D Volume EF: 3D EF:        55 % LV EDV:       140 ml LV ESV:       64 ml LV SV:        77 ml  RIGHT VENTRICLE RV S prime:     12.00 cm/s TAPSE (M-mode): 2.0 cm  LEFT ATRIUM              Index  RIGHT ATRIUM           Index LA diam:        4.50 cm  2.39 cm/m   RA Area:     16.00 cm LA Vol (A2C):   116.0 ml 61.67 ml/m  RA Volume:   40.10 ml  21.32 ml/m LA Vol (A4C):   75.4 ml  40.09 ml/m LA Biplane Vol: 95.2 ml  50.62 ml/m AORTIC VALVE                     PULMONIC VALVE AV Area (Vmax):    0.76 cm      PV Vmax:       3.56 m/s AV Area (Vmean):   0.69 cm      PV Peak grad:  50.7 mmHg AV Area (VTI):     0.64 cm AV Vmax:           443.00 cm/s AV Vmean:          317.333 cm/s AV VTI:            1.110 m AV Peak Grad:      78.5 mmHg AV Mean Grad:      45.7 mmHg LVOT Vmax:         107.00 cm/s LVOT Vmean:        69.800 cm/s LVOT VTI:          0.225 m LVOT/AV VTI ratio: 0.20  AORTA Ao Root diam: 3.00 cm Ao Asc diam:  3.10 cm  MITRAL VALVE                TRICUSPID  VALVE MV Area (PHT): 2.84 cm     TR Peak grad:   30.5 mmHg MV Peak grad:  8.0 mmHg     TR Vmax:        276.00 cm/s MV Mean grad:  3.0 mmHg MV Vmax:       1.41 m/s     SHUNTS MV Vmean:      80.7 cm/s    Systemic VTI:  0.22 m MV Decel Time: 267 msec     Systemic Diam: 2.00 cm MV E velocity: 102.00 cm/s MV A velocity: 144.00 cm/s MV E/A ratio:  0.71  Dina Rich MD Electronically signed by Dina Rich MD Signature Date/Time: 05/15/2022/1:39:25 PM    Final     CT SCANS  CT CORONARY MORPH W/CTA COR W/SCORE 07/10/2022  Addendum 07/11/2022  8:06 AM ADDENDUM REPORT: 07/11/2022 08:04  ADDENDUM: Please see separate dictation for contemporaneously obtained CTA chest, abdomen and pelvis dated 07/10/2022 for full description of relevant extracardiac findings.   Electronically Signed By: Trudie Reed M.D. On: 07/11/2022 08:04  Narrative CLINICAL DATA:  Aortic Stenosis  EXAM: Cardiac TAVR CT  TECHNIQUE: The patient was scanned on a Siemens Force 192 slice scanner. A 120 kV retrospective scan was triggered in the ascending thoracic aorta at 140 HU's. Gantry rotation speed was 250 msecs and collimation was .6 mm. No beta blockade or nitro were given. The 3D data set was reconstructed in 5% intervals of the R-R cycle. Systolic and diastolic phases were analyzed on a dedicated work station using MPR, MIP and VRT modes. The patient received 80 cc of contrast.  FINDINGS: Aortic Valve: Tri leaflet calcified with restricted leaflet motion Calcium score 1272  Aorta: No aneurysm Bovine arch moderate calcific atherosclerosis  Sino-tubular Junction: 24 mm  Ascending Thoracic Aorta: 29 mm  Aortic Arch: 24 mm  Descending  Thoracic Aorta: 26 mm  Sinus of Valsalva Measurements:  Non-coronary: 26 mm   height 17.2 mm  Right - coronary: 25 mm  height 16.8  Left -   coronary: 25.7 mm  height 18.7 mm  Coronary Artery Height above Annulus:  Left Main: 14,4 mm above  annulus  Right Coronary: 14.3 mm above annulus  Virtual Basal Annulus Measurements:  Maximum / Minimum Diameter: 21.1 mm x 20.2 mm Average diameter 21.4 mm  Perimeter: 68.1 mm  Area: 361 mm2  Coronary Arteries: Sufficient height above annulus for deployment  Optimum Fluoroscopic Angle for Delivery: LAO 3 Caudal 3 degrees  Membranous septal length 7.7 mm  IMPRESSION: 1. Tri leaflet calcified AV with restricted leaflet motion Calcium Score 1272  2. Annular area of 361 mm2 suitable for a 23 mm Sapien 3 valve Sinuses are small for a 26 mm Medtronic Evolut  3.  Optimal angiographic angle for deployment LAO 3 Caudal 3 degrees  4.  Coronary arteries sufficient height above annulus for deployment  5.  Membranous septal length 7.7 mm  6.  Dilated right main pulmonary artery 2.7 cm  Charlton Haws  Electronically Signed: By: Charlton Haws M.D. On: 07/10/2022 17:02          EKG:  EKG is NOT ordered today.   Recent Labs: 11/23/2021: ALT 11; Magnesium 2.2; TSH 1.557 09/01/2022: BUN 19; Creatinine, Ser 0.75; Hemoglobin 12.7; Platelets 138; Potassium 3.7; Sodium 138  Recent Lipid Panel No results found for: "CHOL", "TRIG", "HDL", "CHOLHDL", "VLDL", "LDLCALC", "LDLDIRECT"   Risk Assessment/Calculations:     STS SCORE Procedure Type: Isolated AVR PERIOPERATIVE OUTCOME ESTIMATE % Operative Mortality 3.3% Morbidity & Mortality 6.99% Stroke 1.58% Renal Failure 1.35% Reoperation 2.92% Prolonged Ventilation 3.34% Deep Sternal Wound Infection 0.037% Long Hospital Stay (>14 days) 4.82% Short Hospital Stay (<6 days)* 35.3%   Physical Exam:    VS:  BP 126/78   Pulse 65   Ht 5\' 4"  (1.626 m)   Wt 174 lb 9.6 oz (79.2 kg)   SpO2 96%   BMI 29.97 kg/m     Wt Readings from Last 3 Encounters:  09/17/22 174 lb 9.6 oz (79.2 kg)  09/01/22 174 lb 6.1 oz (79.1 kg)  08/11/22 174 lb 9.6 oz (79.2 kg)     GEN: Well nourished, well developed in no acute distress HEENT:  Normal NECK: No JVD LYMPHATICS: No lymphadenopathy CARDIAC: RRR, 3/6 harsh SEM heard best at LUSB. No rubs, gallops RESPIRATORY:  Clear to auscultation without rales, wheezing or rhonchi  ABDOMEN: Soft, non-tender, non-distended MUSCULOSKELETAL:  Trace bilateral pretibial edema; No deformity  SKIN: Warm and dry NEUROLOGIC:  Alert and oriented x 3 PSYCHIATRIC:  Normal affect   ASSESSMENT:    1. Severe aortic stenosis   2. Essential hypertension   3. Hyperlipidemia, unspecified hyperlipidemia type   4. History of DVT (deep vein thrombosis)   5. Lung nodule    PLAN:    In order of problems listed above:  Severe AS: plan for TAVR with a 23 mm Edwards S3UR via the right TF approach on 8/13 with Dr. Clifton James & Dr. Leafy Ro. She has NYHA class II symptoms, mostly of fatigue. KCCQ and completed today. Instruction letter given to pt.   HTN: BP well controlled. No changes made  HLD: continue statin   Previous DVT: no longer on OAC.  Pulmonary nodule: found on pre TAVR CT and suspicious for malignancy. Set up for PET scan and evaluation with Dr. Gwenyth Bouillon. Per Dr.  Byrum, if this is stage I disease I think she would be a good candidate for SBRT, likely not a great candidate for surgical resection given underlying lung disease and comorbidities.    Medication Adjustments/Labs and Tests Ordered: Current medicines are reviewed at length with the patient today.  Concerns regarding medicines are outlined above.  No orders of the defined types were placed in this encounter.  No orders of the defined types were placed in this encounter.   Patient Instructions  Medication Instructions:  Your physician recommends that you continue on your current medications as directed. Please refer to the Current Medication list given to you today.  *If you need a refill on your cardiac medications before your next appointment, please call your pharmacy*   Lab Work: NONE If you have labs (blood work)  drawn today and your tests are completely normal, you will receive your results only by: MyChart Message (if you have MyChart) OR A paper copy in the mail If you have any lab test that is abnormal or we need to change your treatment, we will call you to review the results.   Testing/Procedures: NONE   Follow-Up: At [facility], you and your health needs are our priority.  As part of our continuing mission to provide you with exceptional heart care, we have created designated Provider Care Teams.  These Care Teams include your primary Cardiologist (physician) and Advanced Practice Providers (APPs -  Physician Assistants and Nurse Practitioners) who all work together to provide you with the care you need, when you need it.  We recommend signing up for the patient portal called "MyChart".  Sign up information is provided on this After Visit Summary.  MyChart is used to connect with patients for Virtual Visits (Telemedicine).  Patients are able to view lab/test results, encounter notes, upcoming appointments, etc.  Non-urgent messages can be sent to your provider as well.   To learn more about what you can do with MyChart, go to [email or web address].    Your next appointment:   KEEP SCHEDULED FOLLOW-UP   Signed, Cline Crock, PA-C  09/17/2022 3:10 PM    [facility]

## 2022-09-17 ENCOUNTER — Other Ambulatory Visit: Payer: Self-pay

## 2022-09-17 ENCOUNTER — Ambulatory Visit: Payer: Medicare Other | Attending: Internal Medicine | Admitting: Physician Assistant

## 2022-09-17 VITALS — BP 126/78 | HR 65 | Ht 64.0 in | Wt 174.6 lb

## 2022-09-17 DIAGNOSIS — E785 Hyperlipidemia, unspecified: Secondary | ICD-10-CM | POA: Diagnosis not present

## 2022-09-17 DIAGNOSIS — Z86718 Personal history of other venous thrombosis and embolism: Secondary | ICD-10-CM | POA: Diagnosis not present

## 2022-09-17 DIAGNOSIS — I350 Nonrheumatic aortic (valve) stenosis: Secondary | ICD-10-CM

## 2022-09-17 DIAGNOSIS — I10 Essential (primary) hypertension: Secondary | ICD-10-CM | POA: Diagnosis not present

## 2022-09-17 DIAGNOSIS — R911 Solitary pulmonary nodule: Secondary | ICD-10-CM

## 2022-09-17 NOTE — Progress Notes (Signed)
Pre Surgical Assessment: 5 M Walk Test  32M=16.94ft  5 Meter Walk Test- trial 1: 07.48 seconds 5 Meter Walk Test- trial 2: 07.45 seconds 5 Meter Walk Test- trial 3: 07.08 seconds 5 Meter Walk Test Average: 07.33 seconds

## 2022-09-17 NOTE — Patient Instructions (Signed)
Medication Instructions:  Your physician recommends that you continue on your current medications as directed. Please refer to the Current Medication list given to you today.  *If you need a refill on your cardiac medications before your next appointment, please call your pharmacy*   Lab Work: NONE If you have labs (blood work) drawn today and your tests are completely normal, you will receive your results only by: [place] (if you have MyChart) OR A paper copy in the mail If you have any lab test that is abnormal or we need to change your treatment, we will call you to review the results.   Testing/Procedures: NONE   Follow-Up: At [facility], you and your health needs are our priority.  As part of our continuing mission to provide you with exceptional heart care, we have created designated Provider Care Teams.  These Care Teams include your primary Cardiologist (physician) and Advanced Practice Providers (APPs -  Physician Assistants and Nurse Practitioners) who all work together to provide you with the care you need, when you need it.  We recommend signing up for the patient portal called "MyChart".  Sign up information is provided on this After Visit Summary.  MyChart is used to connect with patients for Virtual Visits (Telemedicine).  Patients are able to view lab/test results, encounter notes, upcoming appointments, etc.  Non-urgent messages can be sent to your provider as well.   To learn more about what you can do with MyChart, go to [email or web address].    Your next appointment:   KEEP SCHEDULED FOLLOW-UP

## 2022-09-18 ENCOUNTER — Other Ambulatory Visit (HOSPITAL_COMMUNITY): Payer: Medicare Other

## 2022-09-25 ENCOUNTER — Other Ambulatory Visit (HOSPITAL_COMMUNITY): Payer: Medicare Other

## 2022-09-25 DIAGNOSIS — E785 Hyperlipidemia, unspecified: Secondary | ICD-10-CM | POA: Diagnosis not present

## 2022-09-25 DIAGNOSIS — E559 Vitamin D deficiency, unspecified: Secondary | ICD-10-CM | POA: Diagnosis not present

## 2022-09-25 DIAGNOSIS — R7303 Prediabetes: Secondary | ICD-10-CM | POA: Diagnosis not present

## 2022-09-26 ENCOUNTER — Ambulatory Visit (HOSPITAL_COMMUNITY)
Admission: RE | Admit: 2022-09-26 | Discharge: 2022-09-26 | Disposition: A | Discharge status: Home / Self Care | Payer: Medicare Other | Source: Ambulatory Visit | Attending: Emergency Medicine | Admitting: Emergency Medicine

## 2022-09-26 ENCOUNTER — Other Ambulatory Visit (HOSPITAL_COMMUNITY): Payer: Medicare Other

## 2022-09-26 DIAGNOSIS — C3490 Malignant neoplasm of unspecified part of unspecified bronchus or lung: Secondary | ICD-10-CM | POA: Insufficient documentation

## 2022-09-26 LAB — GLUCOSE, CAPILLARY: Glucose-Capillary: 87 mg/dL (ref 70–99)

## 2022-09-26 MED ORDER — FLUDEOXYGLUCOSE F - 18 (FDG) INJECTION
8.6000 | Freq: Once | INTRAVENOUS | Status: AC | PRN
  Administered 2022-09-26: 8.6 via INTRAVENOUS

## 2022-09-30 DIAGNOSIS — E559 Vitamin D deficiency, unspecified: Secondary | ICD-10-CM | POA: Diagnosis not present

## 2022-09-30 DIAGNOSIS — I10 Essential (primary) hypertension: Secondary | ICD-10-CM | POA: Diagnosis not present

## 2022-09-30 DIAGNOSIS — D649 Anemia, unspecified: Secondary | ICD-10-CM | POA: Diagnosis not present

## 2022-09-30 DIAGNOSIS — I700 Atherosclerosis of aorta: Secondary | ICD-10-CM | POA: Diagnosis not present

## 2022-09-30 DIAGNOSIS — K5080 Crohn's disease of both small and large intestine without complications: Secondary | ICD-10-CM | POA: Diagnosis not present

## 2022-09-30 DIAGNOSIS — E785 Hyperlipidemia, unspecified: Secondary | ICD-10-CM | POA: Diagnosis not present

## 2022-09-30 DIAGNOSIS — I872 Venous insufficiency (chronic) (peripheral): Secondary | ICD-10-CM | POA: Diagnosis not present

## 2022-09-30 DIAGNOSIS — R7303 Prediabetes: Secondary | ICD-10-CM | POA: Diagnosis not present

## 2022-09-30 DIAGNOSIS — R269 Unspecified abnormalities of gait and mobility: Secondary | ICD-10-CM | POA: Diagnosis not present

## 2022-09-30 DIAGNOSIS — Z733 Stress, not elsewhere classified: Secondary | ICD-10-CM | POA: Diagnosis not present

## 2022-09-30 DIAGNOSIS — I350 Nonrheumatic aortic (valve) stenosis: Secondary | ICD-10-CM | POA: Diagnosis not present

## 2022-09-30 DIAGNOSIS — Z0001 Encounter for general adult medical examination with abnormal findings: Secondary | ICD-10-CM | POA: Diagnosis not present

## 2022-10-01 ENCOUNTER — Other Ambulatory Visit: Payer: Self-pay

## 2022-10-01 DIAGNOSIS — R911 Solitary pulmonary nodule: Secondary | ICD-10-CM

## 2022-10-02 ENCOUNTER — Inpatient Hospital Stay: Payer: Medicare Other | Attending: Internal Medicine | Admitting: Internal Medicine

## 2022-10-02 ENCOUNTER — Other Ambulatory Visit: Payer: Self-pay

## 2022-10-02 ENCOUNTER — Inpatient Hospital Stay: Payer: Medicare Other

## 2022-10-02 VITALS — BP 133/56 | HR 75 | Temp 98.3°F | Resp 20 | Ht 61.0 in | Wt 175.0 lb

## 2022-10-02 DIAGNOSIS — F32A Depression, unspecified: Secondary | ICD-10-CM | POA: Diagnosis not present

## 2022-10-02 DIAGNOSIS — Z8249 Family history of ischemic heart disease and other diseases of the circulatory system: Secondary | ICD-10-CM | POA: Insufficient documentation

## 2022-10-02 DIAGNOSIS — M47816 Spondylosis without myelopathy or radiculopathy, lumbar region: Secondary | ICD-10-CM | POA: Diagnosis not present

## 2022-10-02 DIAGNOSIS — Z803 Family history of malignant neoplasm of breast: Secondary | ICD-10-CM | POA: Insufficient documentation

## 2022-10-02 DIAGNOSIS — Z87891 Personal history of nicotine dependence: Secondary | ICD-10-CM | POA: Insufficient documentation

## 2022-10-02 DIAGNOSIS — Z823 Family history of stroke: Secondary | ICD-10-CM | POA: Diagnosis not present

## 2022-10-02 DIAGNOSIS — Z86718 Personal history of other venous thrombosis and embolism: Secondary | ICD-10-CM | POA: Diagnosis not present

## 2022-10-02 DIAGNOSIS — I2510 Atherosclerotic heart disease of native coronary artery without angina pectoris: Secondary | ICD-10-CM | POA: Insufficient documentation

## 2022-10-02 DIAGNOSIS — K8020 Calculus of gallbladder without cholecystitis without obstruction: Secondary | ICD-10-CM | POA: Insufficient documentation

## 2022-10-02 DIAGNOSIS — I700 Atherosclerosis of aorta: Secondary | ICD-10-CM | POA: Insufficient documentation

## 2022-10-02 DIAGNOSIS — Z79899 Other long term (current) drug therapy: Secondary | ICD-10-CM | POA: Insufficient documentation

## 2022-10-02 DIAGNOSIS — F419 Anxiety disorder, unspecified: Secondary | ICD-10-CM | POA: Diagnosis not present

## 2022-10-02 DIAGNOSIS — Z808 Family history of malignant neoplasm of other organs or systems: Secondary | ICD-10-CM | POA: Diagnosis not present

## 2022-10-02 DIAGNOSIS — C3490 Malignant neoplasm of unspecified part of unspecified bronchus or lung: Secondary | ICD-10-CM

## 2022-10-02 DIAGNOSIS — K5090 Crohn's disease, unspecified, without complications: Secondary | ICD-10-CM | POA: Insufficient documentation

## 2022-10-02 DIAGNOSIS — I10 Essential (primary) hypertension: Secondary | ICD-10-CM | POA: Insufficient documentation

## 2022-10-02 DIAGNOSIS — K219 Gastro-esophageal reflux disease without esophagitis: Secondary | ICD-10-CM | POA: Insufficient documentation

## 2022-10-02 DIAGNOSIS — G629 Polyneuropathy, unspecified: Secondary | ICD-10-CM | POA: Diagnosis not present

## 2022-10-02 DIAGNOSIS — I080 Rheumatic disorders of both mitral and aortic valves: Secondary | ICD-10-CM | POA: Diagnosis not present

## 2022-10-02 DIAGNOSIS — Z818 Family history of other mental and behavioral disorders: Secondary | ICD-10-CM | POA: Diagnosis not present

## 2022-10-02 DIAGNOSIS — C3411 Malignant neoplasm of upper lobe, right bronchus or lung: Secondary | ICD-10-CM | POA: Diagnosis not present

## 2022-10-02 DIAGNOSIS — E785 Hyperlipidemia, unspecified: Secondary | ICD-10-CM | POA: Diagnosis not present

## 2022-10-02 DIAGNOSIS — R5383 Other fatigue: Secondary | ICD-10-CM

## 2022-10-02 DIAGNOSIS — R911 Solitary pulmonary nodule: Secondary | ICD-10-CM

## 2022-10-02 LAB — CBC WITH DIFFERENTIAL/PLATELET
Abs Immature Granulocytes: 0.04 10*3/uL (ref 0.00–0.07)
Basophils Absolute: 0 10*3/uL (ref 0.0–0.1)
Basophils Relative: 1 %
Eosinophils Absolute: 0.2 10*3/uL (ref 0.0–0.5)
Eosinophils Relative: 2 %
HCT: 35.2 % — ABNORMAL LOW (ref 36.0–46.0)
Hemoglobin: 11.6 g/dL — ABNORMAL LOW (ref 12.0–15.0)
Immature Granulocytes: 1 %
Lymphocytes Relative: 17 %
Lymphs Abs: 1.5 10*3/uL (ref 0.7–4.0)
MCH: 29.3 pg (ref 26.0–34.0)
MCHC: 33 g/dL (ref 30.0–36.0)
MCV: 88.9 fL (ref 80.0–100.0)
Monocytes Absolute: 0.7 10*3/uL (ref 0.1–1.0)
Monocytes Relative: 9 %
Neutro Abs: 6.1 10*3/uL (ref 1.7–7.7)
Neutrophils Relative %: 70 %
Platelets: 142 10*3/uL — ABNORMAL LOW (ref 150–400)
RBC: 3.96 MIL/uL (ref 3.87–5.11)
RDW: 13.2 % (ref 11.5–15.5)
WBC: 8.5 10*3/uL (ref 4.0–10.5)
nRBC: 0 % (ref 0.0–0.2)

## 2022-10-02 LAB — COMPREHENSIVE METABOLIC PANEL
ALT: 7 U/L (ref 0–44)
AST: 12 U/L — ABNORMAL LOW (ref 15–41)
Albumin: 4.2 g/dL (ref 3.5–5.0)
Alkaline Phosphatase: 47 U/L (ref 38–126)
Anion gap: 3 — ABNORMAL LOW (ref 5–15)
BUN: 18 mg/dL (ref 8–23)
CO2: 33 mmol/L — ABNORMAL HIGH (ref 22–32)
Calcium: 9.5 mg/dL (ref 8.9–10.3)
Chloride: 103 mmol/L (ref 98–111)
Creatinine, Ser: 0.82 mg/dL (ref 0.44–1.00)
GFR, Estimated: >60 mL/min (ref >60)
Glucose, Bld: 112 mg/dL — ABNORMAL HIGH (ref 70–99)
Potassium: 4.4 mmol/L (ref 3.5–5.1)
Sodium: 139 mmol/L (ref 135–145)
Total Bilirubin: 0.6 mg/dL (ref 0.3–1.2)
Total Protein: 7.1 g/dL (ref 6.5–8.1)

## 2022-10-02 NOTE — Progress Notes (Signed)
[facility] [phone]   [phone]  CONSULT NOTE  REFERRING PHYSICIAN:Dr. Levy Pupa  REASON FOR CONSULTATION:  82 years old white female with hide with suspicious lung cancer  HPI Barbara Lucero is a 82 y.o. female with past medical history significant for anxiety, osteoarthritis, chron's disease, depression, left deep venous thrombosis, hypertension, dyslipidemia, neuropathy, GERD as well as severe aortic stenosis and history of smoking but quit long time ago.  She was undergoing evaluation for a TAVR procedure and CT angiogram scan of the chest was performed on 07/10/2022 and that showed enlarging pulmonary nodule in the posterior aspect of the right upper lobe measuring 1.6 x 1.3 x 1.6 cm highly concerning for progressive pulmonary neoplasm.  There was a prominent but not enlarged right hilar lymph node that can suspicious but nonspecific.  The patient then had a bronchoscopy under the care of Dr. Delton Coombes on 09/01/2022 and the final cytology ([id number]) showed atypical cells present but not diagnostic of malignancy.  The patient then had a PET scan on 09/26/2022 and that showed a 1.8 x 1.5 cm solid nodule in the posterior right upper lobe with maximum SUV of 12.8.  There is no hyper metabolic thoracic adenopathy and low evidence for distant metastatic disease. The patient was referred to me today for evaluation and recommendation regarding treatment of her condition. When seen today the patient is feeling fine except for fatigue.  She denied having any current chest pain, shortness of breath, cough or hemoptysis.  She has no recent weight loss or night sweats.  She has intermittent nausea but no vomiting, diarrhea, abdominal pain or constipation.  She has no headache or visual changes. Family history significant for mother and father with heart disease.  2 Sister with breast cancer and 1 brother with throat cancer. The patient is a widow and has 1 living  daughter.  Her son with cerebral palsy is deceased.  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  She lives with her daughter Barbara Lucero.  She has a history of smoking but quit 22 years ago.  She has no history of alcohol or drug abuse.   HPI  Past Medical History:  Diagnosis Date   Anxiety    Arthritis    Cancer (HCC)    face- removed in office   Crohn disease (HCC)    Depression    DVT (deep venous thrombosis) (HCC) 07/27/2017   LLE   GERD (gastroesophageal reflux disease)    HTN (hypertension)    Hyperlipidemia    Neuropathy    Severe aortic stenosis    Varicose veins of bilateral lower extremities with other complications     Past Surgical History:  Procedure Laterality Date   BOWEL RESECTION  1970   BRONCHIAL BIOPSY  09/01/2022   Procedure: BRONCHIAL BIOPSIES;  Surgeon: Leslye Peer, MD;  Location: [facility] ENDOSCOPY;  Service: Pulmonary;;   BRONCHIAL BRUSHINGS  09/01/2022   Procedure: BRONCHIAL BRUSHINGS;  Surgeon: Leslye Peer, MD;  Location: [facility] ENDOSCOPY;  Service: Pulmonary;;   BRONCHIAL NEEDLE ASPIRATION BIOPSY  09/01/2022   Procedure: BRONCHIAL NEEDLE ASPIRATION BIOPSIES;  Surgeon: Leslye Peer, MD;  Location: [facility] ENDOSCOPY;  Service: Pulmonary;;   EYE SURGERY Bilateral    cataract   FIDUCIAL MARKER PLACEMENT  09/01/2022   Procedure: FIDUCIAL MARKER PLACEMENT;  Surgeon: Leslye Peer, MD;  Location: [facility] ENDOSCOPY;  Service: Pulmonary;;   HIP ARTHROPLASTY Left 06/06/2016   Procedure: ARTHROPLASTY  BIPOLAR HIP (HEMIARTHROPLASTY);  Surgeon: Vickki Hearing, MD;  Location: [facility] ORS;  Service: Orthopedics;  Laterality: Left;   RIGHT HEART CATH AND CORONARY ANGIOGRAPHY N/A 07/07/2022   Procedure: RIGHT HEART CATH AND CORONARY ANGIOGRAPHY;  Surgeon: Kathleene Hazel, MD;  Location: [facility] INVASIVE CV LAB;  Service: Cardiovascular;  Laterality: N/A;    Family History  Problem Relation Age of Onset   Stroke Mother    Heart attack Father    Breast cancer Sister     Breast cancer Sister    Bipolar disorder Daughter    Other Son        disabled    Social History Social History   Tobacco Use   Smoking status: Former    Current packs/day: 0.50    Average packs/day: 0.5 packs/day for 20.0 years (10.0 ttl pk-yrs)    Types: Cigarettes   Smokeless tobacco: Never  Vaping Use   Vaping status: Never Used  Substance Use Topics   Alcohol use: No   Drug use: No    Allergies  Allergen Reactions   Coconut (Cocos Nucifera) Hives   Latex Hives    Current Outpatient Medications  Medication Sig Dispense Refill   acetaminophen (TYLENOL) 500 MG tablet Take 1,000 mg by mouth 2 (two) times daily.     albuterol (VENTOLIN HFA) 108 (90 Base) MCG/ACT inhaler Inhale 2 puffs into the lungs every 4 (four) hours as needed for shortness of breath or wheezing.     ALPRAZolam (XANAX) 0.25 MG tablet Take 0.25 mg by mouth at bedtime.     Ascorbic Acid (VITAMIN C) 1000 MG tablet Take 1,000 mg by mouth daily.     aspirin EC 81 MG tablet Take 81 mg by mouth at bedtime. Swallow whole.     b complex vitamins tablet Take 1 tablet by mouth daily.     Calcium Carb-Cholecalciferol (CALCIUM 600+D) 600-20 MG-MCG TABS Take 1 tablet by mouth daily.     Cholecalciferol (VITAMIN D3) 50 MCG (2000 UT) capsule Take 2,000 Units by mouth daily.     cyanocobalamin (VITAMIN B12) 1000 MCG tablet Take 1,000 mcg by mouth daily as needed (energy).     furosemide (LASIX) 20 MG tablet Take 20 mg by mouth every Wednesday.     gabapentin (NEURONTIN) 100 MG capsule Take 1 capsule (100 mg total) by mouth 2 (two) times daily. (Patient taking differently: Take 100 mg by mouth 3 (three) times daily.)     hydrocortisone cream 1 % Apply 1 Application topically 2 (two) times daily as needed (rash).     Multiple Vitamins-Minerals (HAIR SKIN & NAILS) TABS Take 3 tablets by mouth daily.     Multiple Vitamins-Minerals (MULTIVITAMIN WITH MINERALS) tablet Take 1 tablet by mouth daily.     olmesartan (BENICAR) 40  MG tablet Take 40 mg by mouth daily.     pantoprazole (PROTONIX) 40 MG tablet Take 40 mg by mouth daily.     Psyllium (METAMUCIL PO) Take 1 Dose by mouth daily as needed (constipation). 1 dose = 1 teaspoonful     sertraline (ZOLOFT) 100 MG tablet Take 100 mg by mouth at bedtime.     simvastatin (ZOCOR) 10 MG tablet Take 10 mg by mouth at bedtime.     solifenacin (VESICARE) 10 MG tablet Take 10 mg by mouth at bedtime.     sulfaSALAzine (AZULFIDINE) 500 MG tablet Take 1 tablet (500 mg total) by mouth 2 (two) times daily. 60 tablet 2   vitamin E 400  UNIT capsule Take 400 Units by mouth daily.     No current facility-administered medications for this visit.    Review of Systems  Constitutional: positive for fatigue Eyes: negative Ears, nose, mouth, throat, and face: negative Respiratory: negative Cardiovascular: negative Gastrointestinal: negative Genitourinary:negative Integument/breast: negative Hematologic/lymphatic: negative Musculoskeletal:negative Neurological: negative Behavioral/Psych: negative Endocrine: negative Allergic/Immunologic: negative  Physical Exam  HQI:ONGEX, healthy, no distress, well nourished, and well developed SKIN: skin color, texture, turgor are normal, no rashes or significant lesions HEAD: Normocephalic, No masses, lesions, tenderness or abnormalities EYES: normal, PERRLA, Conjunctiva are pink and non-injected EARS: External ears normal, Canals clear OROPHARYNX:no exudate, no erythema, and lips, buccal mucosa, and tongue normal  NECK: supple, no adenopathy, no JVD LYMPH:  no palpable lymphadenopathy, no hepatosplenomegaly BREAST:not examined LUNGS: clear to auscultation , and palpation HEART: regular rate & rhythm, no murmurs, and no gallops ABDOMEN:abdomen soft, non-tender, normal bowel sounds, and no masses or organomegaly BACK: Back symmetric, no curvature., No CVA tenderness EXTREMITIES:no joint deformities, effusion, or inflammation, no edema   NEURO: alert & oriented x 3 with fluent speech, no focal motor/sensory deficits  PERFORMANCE STATUS: ECOG 1  LABORATORY DATA: Lab Results  Component Value Date   WBC 7.3 09/01/2022   HGB 12.7 09/01/2022   HCT 39.7 09/01/2022   MCV 91.5 09/01/2022   PLT 138 (L) 09/01/2022      Chemistry      Component Value Date/Time   NA 138 09/01/2022 0939   NA 143 06/23/2022 1201   K 3.7 09/01/2022 0939   CL 101 09/01/2022 0939   CO2 27 09/01/2022 0939   BUN 19 09/01/2022 0939   BUN 17 06/23/2022 1201   CREATININE 0.75 09/01/2022 0939      Component Value Date/Time   CALCIUM 9.1 09/01/2022 0939   ALKPHOS 65 11/23/2021 0554   AST 15 11/23/2021 0554   ALT 11 11/23/2021 0554   BILITOT 0.9 11/23/2021 0554       RADIOGRAPHIC STUDIES: NM PET Image Initial (PI) Skull Base To Thigh  Result Date: 10/01/2022 CLINICAL DATA:  Initial treatment strategy for non-small cell lung cancer. EXAM: NUCLEAR MEDICINE PET SKULL BASE TO THIGH TECHNIQUE: 8.6 mCi F-18 FDG was injected intravenously. Full-ring PET imaging was performed from the skull base to thigh after the radiotracer. CT data was obtained and used for attenuation correction and anatomic localization. Fasting blood glucose: 87 mg/dl COMPARISON:  CTA chest dated 07/10/2022 FINDINGS: Mediastinal blood pool activity: SUV max 2.7 Liver activity: SUV max NA NECK: No hypermetabolic cervical lymphadenopathy. Incidental CT findings: None. CHEST: 1.8 x 1.5 cm solid nodule in the posterior right upper lobe (series 7/image 19), max SUV 12.8. Adjacent fiducial marker. This corresponds to the patient's known primary bronchogenic carcinoma. No hypermetabolic thoracic lymphadenopathy. Incidental CT findings: Atherosclerotic calcifications the aortic arch and root/valve. Move severe 3 vessel coronary atherosclerosis. Mitral valve annular calcifications. ABDOMEN/PELVIS: Focal hypermetabolism along the posterior aspect of the gastric antrum, with associated mild wall  thickening on CT (series 4/image 102), max SUV 5.6. While nonspecific, a subtle primary gastric lesion is possible. No abnormal hypermetabolism in the liver, spleen, pancreas, or adrenal glands. No abnormality abdominopelvic lymphadenopathy. Incidental CT findings: Cholelithiasis (series 4/image 109), without associated inflammatory changes. Atherosclerotic calcifications the abdominal aorta and branch vessels. Pelvic floor pessary. SKELETON: Mild sternal hypermetabolism, max SUV 2.3, without CT correlate. This appearance is not considered likely to reflect isolated osseous metastasis. Otherwise, no focal hypermetabolic activity to suggest skeletal metastases. Incidental CT findings: Degenerative changes  of the visualized thoracolumbar spine with mild lumbar levoscoliosis. Left hip arthroplasty. IMPRESSION: 1.8 cm right upper lobe nodule, corresponding to the patient's known primary bronchogenic carcinoma. Mild sternal hypermetabolism, nonspecific, not considered likely to reflect isolated osseous metastasis. Mild hypermetabolism along the posterior gastric antrum, subtle primary gastric lesion not excluded. Endoscopy is suggested for further evaluation. No findings specific for metastatic disease. Electronically Signed   By: Charline Bills M.D.   On: 10/01/2022 23:38    ASSESSMENT: This is a very pleasant 82 years old white female with highly suspicious stage Ia (T1b, N0, M0) non-small cell lung cancer presented with right upper lobe lung nodule.  The biopsy was consistent with atypical cells but not diagnostic for malignancy but her PET scan showed significant hypermetabolic activity in this nodule consistent with lung malignancy.   PLAN: I had a lengthy discussion with the patient today about her current condition and treatment options. I personally and independently reviewed the scan images and discussed the result and showed the images to the patient today. Her PET scan showed no concerning findings  for metastatic disease. I do not think the patient will be a great surgical candidate for resection because of her comorbidities. I recommended for her to see radiation oncology for consideration of SBRT to the right upper lobe lung nodule with the curative intention. I will see her back for follow-up visit in 4 months for evaluation with repeat CT scan of the chest for restaging of her disease. The patient was advised to call if she has any other concerning symptoms in the interval. The patient voices understanding of current disease status and treatment options and is in agreement with the current care plan.  All questions were answered. The patient knows to call the clinic with any problems, questions or concerns. We can certainly see the patient much sooner if necessary.  Thank you so much for allowing me to participate in the care of Barbara Lucero. I will continue to follow up the patient with you and assist in her care. The total time spent in the appointment was 60 minutes.  Disclaimer: This note was dictated with voice recognition software. Similar sounding words can inadvertently be transcribed and may not be corrected upon review.   Lajuana Matte October 02, 2022, 1:44 PM

## 2022-10-03 ENCOUNTER — Other Ambulatory Visit: Payer: Self-pay

## 2022-10-03 ENCOUNTER — Encounter (HOSPITAL_COMMUNITY)
Admission: RE | Admit: 2022-10-03 | Discharge: 2022-10-03 | Disposition: A | Discharge status: Home / Self Care | Payer: Medicare Other | Source: Ambulatory Visit | Attending: Cardiovascular Disease | Admitting: Cardiovascular Disease

## 2022-10-03 ENCOUNTER — Ambulatory Visit (HOSPITAL_COMMUNITY): Admission: RE | Admit: 2022-10-03 | Payer: Medicare Other | Source: Ambulatory Visit

## 2022-10-03 DIAGNOSIS — I700 Atherosclerosis of aorta: Secondary | ICD-10-CM | POA: Diagnosis not present

## 2022-10-03 DIAGNOSIS — Z1152 Encounter for screening for COVID-19: Secondary | ICD-10-CM | POA: Insufficient documentation

## 2022-10-03 DIAGNOSIS — I350 Nonrheumatic aortic (valve) stenosis: Secondary | ICD-10-CM | POA: Diagnosis not present

## 2022-10-03 DIAGNOSIS — Z01818 Encounter for other preprocedural examination: Secondary | ICD-10-CM | POA: Diagnosis not present

## 2022-10-03 DIAGNOSIS — R911 Solitary pulmonary nodule: Secondary | ICD-10-CM | POA: Diagnosis not present

## 2022-10-03 LAB — COMPREHENSIVE METABOLIC PANEL
ALT: 12 U/L (ref 0–44)
AST: 15 U/L (ref 15–41)
Albumin: 4.1 g/dL (ref 3.5–5.0)
Alkaline Phosphatase: 45 U/L (ref 38–126)
Anion gap: 9 (ref 5–15)
BUN: 24 mg/dL — ABNORMAL HIGH (ref 8–23)
CO2: 27 mmol/L (ref 22–32)
Calcium: 8.9 mg/dL (ref 8.9–10.3)
Chloride: 100 mmol/L (ref 98–111)
Creatinine, Ser: 1.01 mg/dL — ABNORMAL HIGH (ref 0.44–1.00)
GFR, Estimated: 56 mL/min — ABNORMAL LOW (ref >60)
Glucose, Bld: 95 mg/dL (ref 70–99)
Potassium: 3.8 mmol/L (ref 3.5–5.1)
Sodium: 136 mmol/L (ref 135–145)
Total Bilirubin: 0.7 mg/dL (ref 0.3–1.2)
Total Protein: 7.3 g/dL (ref 6.5–8.1)

## 2022-10-03 LAB — URINALYSIS, ROUTINE W REFLEX MICROSCOPIC
Bilirubin Urine: NEGATIVE
Glucose, UA: NEGATIVE mg/dL
Hgb urine dipstick: NEGATIVE
Ketones, ur: NEGATIVE mg/dL
Nitrite: NEGATIVE
Protein, ur: NEGATIVE mg/dL
Specific Gravity, Urine: 1.018 (ref 1.005–1.030)
WBC, UA: >50 WBC/hpf (ref 0–5)
pH: 5 (ref 5.0–8.0)

## 2022-10-03 LAB — PROTIME-INR
INR: 1 (ref 0.8–1.2)
Prothrombin Time: 13.7 seconds (ref 11.4–15.2)

## 2022-10-03 LAB — CBC
HCT: 37.7 % (ref 36.0–46.0)
Hemoglobin: 11.9 g/dL — ABNORMAL LOW (ref 12.0–15.0)
MCH: 29 pg (ref 26.0–34.0)
MCHC: 31.6 g/dL (ref 30.0–36.0)
MCV: 92 fL (ref 80.0–100.0)
Platelets: 160 10*3/uL (ref 150–400)
RBC: 4.1 MIL/uL (ref 3.87–5.11)
RDW: 13.2 % (ref 11.5–15.5)
WBC: 10.8 10*3/uL — ABNORMAL HIGH (ref 4.0–10.5)
nRBC: 0 % (ref 0.0–0.2)

## 2022-10-03 LAB — TYPE AND SCREEN
ABO/RH(D): A NEG
Antibody Screen: NEGATIVE

## 2022-10-03 LAB — SURGICAL PCR SCREEN
MRSA, PCR: POSITIVE — AB
Staphylococcus aureus: POSITIVE — AB

## 2022-10-03 MED ORDER — SULFAMETHOXAZOLE-TRIMETHOPRIM 800-160 MG PO TABS: 1.0000 | ORAL_TABLET | Freq: Two times a day (BID) | ORAL | 0 refills | Status: AC

## 2022-10-03 NOTE — Progress Notes (Signed)
Patient signed all consents at PAT lab appointment. CHG soap and instructions were given to patient. CHG surgical prep reviewed with patient and all questions answered.  Patients chart send to anesthesia for review.  

## 2022-10-06 ENCOUNTER — Telehealth: Payer: Self-pay | Admitting: Radiation Oncology

## 2022-10-06 MED ORDER — CEFAZOLIN SODIUM-DEXTROSE 2-4 GM/100ML-% IV SOLN
2.0000 g | INTRAVENOUS | Status: AC
  Administered 2022-10-07: 2 g via INTRAVENOUS
  Filled 2022-10-06: qty 100

## 2022-10-06 MED ORDER — HEPARIN 30,000 UNITS/1000 ML (OHS) CELLSAVER SOLUTION
Status: DC
  Filled 2022-10-06 (×2): qty 1000

## 2022-10-06 MED ORDER — VANCOMYCIN HCL IN DEXTROSE 1-5 GM/200ML-% IV SOLN
1000.0000 mg | Freq: Once | INTRAVENOUS | Status: AC
  Administered 2022-10-07: 1000 mg via INTRAVENOUS
  Filled 2022-10-06: qty 200

## 2022-10-06 MED ORDER — DEXMEDETOMIDINE HCL IN NACL 400 MCG/100ML IV SOLN
0.1000 ug/kg/h | INTRAVENOUS | Status: AC
  Administered 2022-10-07: .8 ug/kg/h via INTRAVENOUS
  Filled 2022-10-06: qty 100

## 2022-10-06 MED ORDER — POTASSIUM CHLORIDE 2 MEQ/ML IV SOLN
80.0000 meq | INTRAVENOUS | Status: DC
  Filled 2022-10-06 (×2): qty 40

## 2022-10-06 MED ORDER — MAGNESIUM SULFATE 50 % IJ SOLN
40.0000 meq | INTRAMUSCULAR | Status: DC
  Filled 2022-10-06 (×2): qty 9.85

## 2022-10-06 MED ORDER — NOREPINEPHRINE 4 MG/250ML-% IV SOLN
0.0000 ug/min | INTRAVENOUS | Status: DC
  Filled 2022-10-06: qty 250

## 2022-10-06 NOTE — Telephone Encounter (Signed)
Called patient to schedule a consultation w. Dr. Roselind Messier. Patient stated she would like to hold off on scheduling Radiation consult due to having heart surgery 10/07/22. Advised patient to contact Dr. Asa Lente office to send [place] a new referral when ready to schedule. Closing referral until further notice.

## 2022-10-06 NOTE — H&P (Signed)
[address]       [place] [address]             [phone]                                   [facility] Medical Record #[id number] Date of Birth: 24-Nov-1940   Wendall Stade, MD [name], MD   Chief Complaint: Increasing Dyspnea and fatigue    History of Present Illness:     82 yo female who has been followed by Dr Eden Emms for AS. Pt has had serial echos and with her complaints of increasing DOE and fatigue, the most recent echo had normal LV function with severe calcific AS with a mean gradient of and pk gradient of . She uses a walker outside of the house and lives with her daughter who says her mom is very active going out to prayer mtgs and church and shopping. She was felt secondary to her symptomatic AS to benefit from AVR and underwent cath without CAD and CTA which has acceptable anatomy for TAVR. She tells me we have to use the right groin since her prayer group had sent her a diagram of her body and feels that God has chosen the right side for access.             Past Medical History:  Diagnosis Date   Anxiety     Arthritis     Crohn disease (HCC)     Depression     HTN (hypertension)     Hyperlipidemia     Neuropathy     Severe aortic stenosis     Varicose veins of bilateral lower extremities with other complications                 Past Surgical History:  Procedure Laterality Date   BOWEL RESECTION       HIP ARTHROPLASTY Left 06/06/2016    Procedure: ARTHROPLASTY BIPOLAR HIP (HEMIARTHROPLASTY);  Surgeon: Vickki Hearing, MD;  Location: [facility];  Service: Orthopedics;  Laterality: Left;   RIGHT HEART CATH AND CORONARY ANGIOGRAPHY N/A 07/07/2022    Procedure: RIGHT HEART CATH AND CORONARY ANGIOGRAPHY;  Surgeon: Kathleene Hazel, MD;  Location: [facility];  Service: Cardiovascular;  Laterality: N/A;          Tobacco Use History  Social History        Tobacco Use  Smoking Status  Former   Packs/day: 0.50   Years: 20.00   Additional pack years: 0.00   Total pack years: 10.00   Types: Cigarettes  Smokeless Tobacco Never      Social History       Substance and Sexual Activity  Alcohol Use No      Social History         Socioeconomic History   Marital status: Widowed      Spouse name: Not on file   Number of children: 2   Years of education: Not on file   Highest education level: Not on file  Occupational History   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  Tobacco Use   Smoking status: Former      Packs/day: 0.50      Years: 20.00      Additional pack years: 0.00      Total pack  years: 10.00      Types: Cigarettes   Smokeless tobacco: Never  Vaping Use   Vaping Use: Never used  Substance and Sexual Activity   Alcohol use: No   Drug use: No   Sexual activity: Not Currently      Birth control/protection: Post-menopausal  Other Topics Concern   Not on file  Social History Narrative   Not on file    Social Determinants of Health        Financial Resource Strain: Not on file  Food Insecurity: No Food Insecurity (11/23/2021)    Hunger Vital Sign     Worried About Running Out of Food in the Last Year: Never true     Ran Out of Food in the Last Year: Never true  Transportation Needs: No Transportation Needs (11/23/2021)    PRAPARE - Therapist, art (Medical): No     Lack of Transportation (Non-Medical): No  Physical Activity: Not on file  Stress: Not on file  Social Connections: Not on file  Intimate Partner Violence: Not At Risk (11/23/2021)    Humiliation, Afraid, Rape, and Kick questionnaire     Fear of Current or Ex-Partner: No     Emotionally Abused: No     Physically Abused: No     Sexually Abused: No      Allergies      Allergies  Allergen Reactions   Coconut (Cocos Nucifera) Hives   Latex Hives              Current Outpatient Medications  Medication Sig Dispense Refill   metoprolol  tartrate (LOPRESSOR) 50 MG tablet Take one tablet by mouth as directed prior to CT scan 1 tablet 0   acetaminophen (TYLENOL) 500 MG tablet Take 1,200 mg by mouth 2 (two) times daily.       albuterol (VENTOLIN HFA) 108 (90 Base) MCG/ACT inhaler Inhale 2 puffs into the lungs every 4 (four) hours as needed for shortness of breath or wheezing.       ALPRAZolam (XANAX) 0.25 MG tablet Take 0.25 mg by mouth at bedtime.       Ascorbic Acid (VITAMIN C) 1000 MG tablet Take 1,000 mg by mouth.        aspirin EC 81 MG tablet Take 81 mg by mouth daily. Swallow whole.       b complex vitamins tablet Take 1 tablet by mouth daily.       CALCIUM PO Take 600 mg by mouth daily.       Cholecalciferol (VITAMIN D3) 50 MCG (2000 UT) capsule Take 2,000 Units by mouth daily.       cyanocobalamin (VITAMIN B12) 1000 MCG tablet Take 1,000 mcg by mouth daily.       furosemide (LASIX) 20 MG tablet Take 20 mg by mouth once a week.       gabapentin (NEURONTIN) 100 MG capsule Take 1 capsule (100 mg total) by mouth 3 (three) times daily. (Patient taking differently: Take 100 mg by mouth 2 (two) times daily.) 90 capsule 1   mirabegron ER (MYRBETRIQ) 50 MG TB24 tablet TAKE (1) TABLET BY MOUTH AT BEDTIME. 30 tablet 11   Multiple Vitamins-Minerals (MULTIVITAMIN WITH MINERALS) tablet Take 1 tablet by mouth daily.       olmesartan (BENICAR) 40 MG tablet Take 40 mg by mouth daily.       pantoprazole (PROTONIX) 40 MG tablet Take 40 mg by mouth daily.  Psyllium (METAMUCIL PO) Take 1 Package by mouth daily as needed (constipation).       sertraline (ZOLOFT) 100 MG tablet Take 100 mg by mouth at bedtime.       simvastatin (ZOCOR) 10 MG tablet Take 10 mg by mouth in the morning.       sulfaSALAzine (AZULFIDINE) 500 MG tablet Take 1 tablet (500 mg total) by mouth 2 (two) times daily. 60 tablet 2   vitamin E 400 UNIT capsule Take 400 Units by mouth daily.          No current facility-administered medications for this visit.                Family History  Problem Relation Age of Onset   Stroke Mother     Heart attack Father     Breast cancer Sister     Breast cancer Sister     Bipolar disorder Daughter     Other Son          disabled                Physical Exam: Dentures Lungs: clear Cardiac: RR with harsh 5/6 sem Ext: no edema Neuro: intact         Diagnostic Studies & Laboratory data: I have personally reviewed the following studies and agree with the findings   TTE (04/2022) IMPRESSIONS     1. Left ventricular ejection fraction, by estimation, is 60 to 65%. The  left ventricle has normal function. The left ventricle has no regional  wall motion abnormalities. Left ventricular diastolic parameters are  consistent with Grade I diastolic  dysfunction (impaired relaxation). Elevated left atrial pressure.   2. Right ventricular systolic function is normal. The right ventricular  size is normal. There is mildly elevated pulmonary artery systolic  pressure.   3. Left atrial size was severely dilated.   4. The mitral valve is normal in structure. Trivial mitral valve  regurgitation. No evidence of mitral stenosis.   5. The tricuspid valve is abnormal.   6. The aortic valve has an indeterminant number of cusps. There is  moderate calcification of the aortic valve. There is moderate thickening  of the aortic valve. Aortic valve regurgitation is not visualized. Severe  aortic valve stenosis. Aortic valve  mean gradient measures 45.7 mmHg. Aortic valve peak gradient measures 78.5  mmHg. Aortic valve area, by VTI measures 0.64 cm.   7. The inferior vena cava is normal in size with greater than 50%  respiratory variability, suggesting right atrial pressure of 3 mmHg.   FINDINGS   Left Ventricle: Left ventricular ejection fraction, by estimation, is 60  to 65%. The left ventricle has normal function. The left ventricle has no  regional wall motion abnormalities. Definity contrast agent was given IV   to delineate the left ventricular   endocardial borders. Global longitudinal strain performed but not  reported based on interpreter judgement due to suboptimal tracking. The  left ventricular internal cavity size was normal in size. There is no left  ventricular hypertrophy. Left  ventricular diastolic parameters are consistent with Grade I diastolic  dysfunction (impaired relaxation). Elevated left atrial pressure.   Right Ventricle: The right ventricular size is normal. Right vetricular  wall thickness was not well visualized. Right ventricular systolic  function is normal. There is mildly elevated pulmonary artery systolic  pressure. The tricuspid regurgitant velocity   is 2.76 m/s, and with an assumed right atrial pressure of 8 mmHg, the  estimated  right ventricular systolic pressure is 38.5 mmHg.   Left Atrium: Left atrial size was severely dilated.   Right Atrium: Right atrial size was normal in size.   Pericardium: There is no evidence of pericardial effusion.   Mitral Valve: The mitral valve is normal in structure. Trivial mitral  valve regurgitation. No evidence of mitral valve stenosis. MV peak  gradient, 8.0 mmHg. The mean mitral valve gradient is 3.0 mmHg.   Tricuspid Valve: The tricuspid valve is abnormal. Tricuspid valve  regurgitation is mild . No evidence of tricuspid stenosis.   Aortic Valve: The aortic valve has an indeterminant number of cusps. There  is moderate calcification of the aortic valve. There is moderate  thickening of the aortic valve. There is moderate aortic valve annular  calcification. Aortic valve regurgitation  is not visualized. Severe aortic stenosis is present. Aortic valve mean  gradient measures 45.7 mmHg. Aortic valve peak gradient measures 78.5  mmHg. Aortic valve area, by VTI measures 0.64 cm.   Pulmonic Valve: The pulmonic valve was not well visualized. Pulmonic valve  regurgitation is not visualized. No evidence of pulmonic  stenosis.   Aorta: The aortic root is normal in size and structure.   Venous: The inferior vena cava is normal in size with greater than 50%  respiratory variability, suggesting right atrial pressure of 3 mmHg.   IAS/Shunts: The interatrial septum was not well visualized.     LEFT VENTRICLE  PLAX 2D  LVIDd:         4.20 cm   Diastology  LVIDs:         2.80 cm   LV e' medial:    5.11 cm/s  LV PW:         1.00 cm   LV E/e' medial:  20.0  LV IVS:        1.00 cm   LV e' lateral:   8.59 cm/s  LVOT diam:     2.00 cm   LV E/e' lateral: 11.9  LV SV:         71  LV SV Index:   38  LVOT Area:     3.14 cm                             3D Volume EF:                           3D EF:        55 %                           LV EDV:       140 ml                           LV ESV:       64 ml                           LV SV:        77 ml   RIGHT VENTRICLE  RV S prime:     12.00 cm/s  TAPSE (M-mode): 2.0 cm   LEFT ATRIUM              Index        RIGHT ATRIUM  Index  LA diam:        4.50 cm  2.39 cm/m   RA Area:     16.00 cm  LA Vol (A2C):   116.0 ml 61.67 ml/m  RA Volume:   40.10 ml  21.32 ml/m  LA Vol (A4C):   75.4 ml  40.09 ml/m  LA Biplane Vol: 95.2 ml  50.62 ml/m   AORTIC VALVE                     PULMONIC VALVE  AV Area (Vmax):    0.76 cm      PV Vmax:       3.56 m/s  AV Area (Vmean):   0.69 cm      PV Peak grad:  50.7 mmHg  AV Area (VTI):     0.64 cm  AV Vmax:           443.00 cm/s  AV Vmean:          317.333 cm/s  AV VTI:            1.110 m  AV Peak Grad:      78.5 mmHg  AV Mean Grad:      45.7 mmHg  LVOT Vmax:         107.00 cm/s  LVOT Vmean:        69.800 cm/s  LVOT VTI:          0.225 m  LVOT/AV VTI ratio: 0.20    AORTA  Ao Root diam: 3.00 cm  Ao Asc diam:  3.10 cm   MITRAL VALVE                TRICUSPID VALVE  MV Area (PHT): 2.84 cm     TR Peak grad:   30.5 mmHg  MV Peak grad:  8.0 mmHg     TR Vmax:        276.00 cm/s  MV Mean grad:  3.0 mmHg  MV  Vmax:       1.41 m/s     SHUNTS  MV Vmean:      80.7 cm/s    Systemic VTI:  0.22 m  MV Decel Time: 267 msec     Systemic Diam: 2.00 cm  MV E velocity: 102.00 cm/s  MV A velocity: 144.00 cm/s  MV E/A ratio:  0.71    CATH (06/2022) Conclusion       Mid Cx lesion is 30% stenosed.   Ost LAD to Prox LAD lesion is 20% stenosed.   Mid LAD lesion is 30% stenosed.   Prox RCA lesion is 50% stenosed.   Mild non-obstructive CAD Mild elevation right heart pressures PCWP 10 mmHg Mean PA pressure 33 Severe aortic stenosis by echo (I did not cross the aortic valve today).     Recent Radiology Findings:   CTA (06/2022) FINDINGS: Aortic Valve: Tri leaflet calcified with restricted leaflet motion Calcium score 1272   Aorta: No aneurysm Bovine arch moderate calcific atherosclerosis   Sino-tubular Junction: 24 mm   Ascending Thoracic Aorta: 29 mm   Aortic Arch: 24 mm   Descending Thoracic Aorta: 26 mm   Sinus of Valsalva Measurements:   Non-coronary: 26 mm   height 17.2 mm   Right - coronary: 25 mm  height 16.8   Left -   coronary: 25.7 mm  height 18.7 mm   Coronary Artery Height above Annulus:   Left Main: 14,4 mm above annulus   Right Coronary:  14.3 mm above annulus   Virtual Basal Annulus Measurements:   Maximum / Minimum Diameter: 21.1 mm x 20.2 mm Average diameter 21.4 mm   Perimeter: 68.1 mm   Area: 361 mm2   Coronary Arteries: Sufficient height above annulus for deployment   Optimum Fluoroscopic Angle for Delivery: LAO 3 Caudal 3 degrees   Membranous septal length 7.7 mm   IMPRESSION: 1. Tri leaflet calcified AV with restricted leaflet motion Calcium Score 1272   2. Annular area of 361 mm2 suitable for a 23 mm Sapien 3 valve Sinuses are small for a 26 mm Medtronic Evolut   3.  Optimal angiographic angle for deployment LAO 3 Caudal 3 degrees   4.  Coronary arteries sufficient height above annulus for deployment   5.  Membranous septal length 7.7 mm   6.   Dilated right main pulmonary artery 2.7 cm       Recent Lab Findings: Recent Labs       Lab Results  Component Value Date    WBC 7.2 06/23/2022    HGB 10.9 (L) 07/07/2022    HCT 32.0 (L) 07/07/2022    PLT 168 06/23/2022    GLUCOSE 96 06/23/2022    ALT 11 11/23/2021    AST 15 11/23/2021    NA 144 07/07/2022    K 3.5 07/07/2022    CL 101 06/23/2022    CREATININE 0.83 06/23/2022    BUN 17 06/23/2022    CO2 23 06/23/2022    TSH 1.557 11/23/2021    INR 1.2 11/23/2021            Assessment / Plan:     82 yo female with NYHA class II symptoms of severe AS with normal LV function without CAD. With her age and comorbidities I feel she is an ideal candidate for TAVR and would use right femoral artery and sapien 23 mm valve. We discussed the risks and goals of surgery and she understands and wishes to proceed. I would offer her a bail out operation unless circumstances make survivability not likely

## 2022-10-07 ENCOUNTER — Inpatient Hospital Stay (HOSPITAL_COMMUNITY): Payer: Medicare Other

## 2022-10-07 ENCOUNTER — Inpatient Hospital Stay (HOSPITAL_COMMUNITY): Payer: Medicare Other | Admitting: Anesthesiology

## 2022-10-07 ENCOUNTER — Other Ambulatory Visit: Payer: Self-pay | Admitting: Physician Assistant

## 2022-10-07 ENCOUNTER — Encounter (HOSPITAL_COMMUNITY)
Admission: RE | Disposition: A | Discharge status: Home / Self Care | Payer: Self-pay | Source: Home / Self Care | Attending: Cardiovascular Disease

## 2022-10-07 ENCOUNTER — Other Ambulatory Visit: Payer: Self-pay

## 2022-10-07 ENCOUNTER — Inpatient Hospital Stay (HOSPITAL_COMMUNITY): Payer: Medicare Other | Admitting: Physician Assistant

## 2022-10-07 ENCOUNTER — Encounter (HOSPITAL_COMMUNITY): Payer: Self-pay | Admitting: Cardiovascular Disease

## 2022-10-07 ENCOUNTER — Inpatient Hospital Stay (HOSPITAL_COMMUNITY)
Admission: RE | Admit: 2022-10-07 | Discharge: 2022-10-08 | DRG: 267 | Disposition: A | Discharge status: Home / Self Care | Payer: Medicare Other | Attending: Cardiovascular Disease | Admitting: Cardiovascular Disease

## 2022-10-07 DIAGNOSIS — Z96642 Presence of left artificial hip joint: Secondary | ICD-10-CM | POA: Diagnosis not present

## 2022-10-07 DIAGNOSIS — F32A Depression, unspecified: Secondary | ICD-10-CM

## 2022-10-07 DIAGNOSIS — E785 Hyperlipidemia, unspecified: Secondary | ICD-10-CM | POA: Diagnosis not present

## 2022-10-07 DIAGNOSIS — K219 Gastro-esophageal reflux disease without esophagitis: Secondary | ICD-10-CM | POA: Diagnosis present

## 2022-10-07 DIAGNOSIS — Z79899 Other long term (current) drug therapy: Secondary | ICD-10-CM

## 2022-10-07 DIAGNOSIS — Z809 Family history of malignant neoplasm, unspecified: Secondary | ICD-10-CM | POA: Diagnosis not present

## 2022-10-07 DIAGNOSIS — Z9841 Cataract extraction status, right eye: Secondary | ICD-10-CM

## 2022-10-07 DIAGNOSIS — Z823 Family history of stroke: Secondary | ICD-10-CM | POA: Diagnosis not present

## 2022-10-07 DIAGNOSIS — I350 Nonrheumatic aortic (valve) stenosis: Secondary | ICD-10-CM

## 2022-10-07 DIAGNOSIS — K5090 Crohn's disease, unspecified, without complications: Secondary | ICD-10-CM | POA: Diagnosis present

## 2022-10-07 DIAGNOSIS — C3490 Malignant neoplasm of unspecified part of unspecified bronchus or lung: Secondary | ICD-10-CM | POA: Diagnosis present

## 2022-10-07 DIAGNOSIS — I2510 Atherosclerotic heart disease of native coronary artery without angina pectoris: Secondary | ICD-10-CM | POA: Diagnosis not present

## 2022-10-07 DIAGNOSIS — R0902 Hypoxemia: Secondary | ICD-10-CM | POA: Insufficient documentation

## 2022-10-07 DIAGNOSIS — Z86718 Personal history of other venous thrombosis and embolism: Secondary | ICD-10-CM | POA: Diagnosis not present

## 2022-10-07 DIAGNOSIS — Z85828 Personal history of other malignant neoplasm of skin: Secondary | ICD-10-CM | POA: Diagnosis not present

## 2022-10-07 DIAGNOSIS — I10 Essential (primary) hypertension: Secondary | ICD-10-CM | POA: Diagnosis not present

## 2022-10-07 DIAGNOSIS — F418 Other specified anxiety disorders: Secondary | ICD-10-CM | POA: Diagnosis not present

## 2022-10-07 DIAGNOSIS — F419 Anxiety disorder, unspecified: Secondary | ICD-10-CM | POA: Diagnosis present

## 2022-10-07 DIAGNOSIS — Z952 Presence of prosthetic heart valve: Principal | ICD-10-CM

## 2022-10-07 DIAGNOSIS — Z803 Family history of malignant neoplasm of breast: Secondary | ICD-10-CM | POA: Diagnosis not present

## 2022-10-07 DIAGNOSIS — G629 Polyneuropathy, unspecified: Secondary | ICD-10-CM | POA: Diagnosis present

## 2022-10-07 DIAGNOSIS — Z87891 Personal history of nicotine dependence: Secondary | ICD-10-CM | POA: Diagnosis not present

## 2022-10-07 DIAGNOSIS — Z8249 Family history of ischemic heart disease and other diseases of the circulatory system: Secondary | ICD-10-CM

## 2022-10-07 DIAGNOSIS — Z9842 Cataract extraction status, left eye: Secondary | ICD-10-CM

## 2022-10-07 DIAGNOSIS — Z7982 Long term (current) use of aspirin: Secondary | ICD-10-CM | POA: Diagnosis not present

## 2022-10-07 DIAGNOSIS — R5383 Other fatigue: Secondary | ICD-10-CM | POA: Diagnosis present

## 2022-10-07 DIAGNOSIS — Z006 Encounter for examination for normal comparison and control in clinical research program: Secondary | ICD-10-CM | POA: Diagnosis not present

## 2022-10-07 DIAGNOSIS — I700 Atherosclerosis of aorta: Secondary | ICD-10-CM | POA: Diagnosis not present

## 2022-10-07 DIAGNOSIS — C3411 Malignant neoplasm of upper lobe, right bronchus or lung: Secondary | ICD-10-CM | POA: Diagnosis not present

## 2022-10-07 HISTORY — PX: INTRAOPERATIVE TRANSTHORACIC ECHOCARDIOGRAM: SHX6523

## 2022-10-07 HISTORY — PX: TRANSCATHETER AORTIC VALVE REPLACEMENT, TRANSFEMORAL: SHX6400

## 2022-10-07 HISTORY — DX: Presence of prosthetic heart valve: Z95.2

## 2022-10-07 LAB — POCT I-STAT, CHEM 8
BUN: 16 mg/dL (ref 8–23)
Calcium, Ion: 1.22 mmol/L (ref 1.15–1.40)
Chloride: 101 mmol/L (ref 98–111)
Creatinine, Ser: 0.9 mg/dL (ref 0.44–1.00)
Glucose, Bld: 141 mg/dL — ABNORMAL HIGH (ref 70–99)
HCT: 27 % — ABNORMAL LOW (ref 36.0–46.0)
Hemoglobin: 9.2 g/dL — ABNORMAL LOW (ref 12.0–15.0)
Potassium: 4.1 mmol/L (ref 3.5–5.1)
Sodium: 138 mmol/L (ref 135–145)
TCO2: 26 mmol/L (ref 22–32)

## 2022-10-07 LAB — ECHOCARDIOGRAM LIMITED
AR max vel: 2.57 cm2
AV Area VTI: 2.75 cm2
AV Area mean vel: 2.67 cm2
AV Mean grad: 9 mmHg
AV Peak grad: 16.6 mmHg
Ao pk vel: 2.04 m/s
Calc EF: 65.3 %
MV VTI: 3.22 cm2
Single Plane A2C EF: 64.4 %
Single Plane A4C EF: 66 %

## 2022-10-07 SURGERY — TRANSCATHETER AORTIC VALVE REPLACEMENT, TRANSFEMORAL
Anesthesia: General | Laterality: Right

## 2022-10-07 MED ORDER — ACETAMINOPHEN 650 MG RE SUPP: 650.0000 mg | Freq: Four times a day (QID) | RECTAL | Status: DC | PRN

## 2022-10-07 MED ORDER — NITROGLYCERIN IN D5W 200-5 MCG/ML-% IV SOLN: 0.0000 ug/min | INTRAVENOUS | Status: DC

## 2022-10-07 MED ORDER — PHENYLEPHRINE HCL-NACL 20-0.9 MG/250ML-% IV SOLN
INTRAVENOUS | Status: DC | PRN
Start: 2022-10-07 — End: 2022-10-07
  Administered 2022-10-07: 25 ug/min via INTRAVENOUS
  Administered 2022-10-07: 80 ug via INTRAVENOUS

## 2022-10-07 MED ORDER — SULFASALAZINE 500 MG PO TABS
500.0000 mg | ORAL_TABLET | Freq: Two times a day (BID) | ORAL | Status: DC
  Administered 2022-10-07 – 2022-10-08 (×2): 500 mg via ORAL
  Filled 2022-10-07 (×3): qty 1

## 2022-10-07 MED ORDER — TRAMADOL HCL 50 MG PO TABS
50.0000 mg | ORAL_TABLET | ORAL | Status: DC | PRN
  Administered 2022-10-07: 100 mg via ORAL
  Filled 2022-10-07: qty 2

## 2022-10-07 MED ORDER — ONDANSETRON HCL 4 MG/2ML IJ SOLN: 4.0000 mg | Freq: Four times a day (QID) | INTRAMUSCULAR | Status: DC | PRN

## 2022-10-07 MED ORDER — CEFAZOLIN SODIUM-DEXTROSE 2-4 GM/100ML-% IV SOLN
2.0000 g | Freq: Three times a day (TID) | INTRAVENOUS | Status: AC
  Administered 2022-10-07 – 2022-10-08 (×2): 2 g via INTRAVENOUS
  Filled 2022-10-07 (×2): qty 100

## 2022-10-07 MED ORDER — CHLORHEXIDINE GLUCONATE 4 % EX SOLN
30.0000 mL | CUTANEOUS | Status: DC
  Filled 2022-10-07: qty 30

## 2022-10-07 MED ORDER — IRBESARTAN 300 MG PO TABS
300.0000 mg | ORAL_TABLET | Freq: Every day | ORAL | Status: DC
  Administered 2022-10-08: 300 mg via ORAL
  Filled 2022-10-07: qty 1

## 2022-10-07 MED ORDER — CHLORHEXIDINE GLUCONATE 0.12 % MT SOLN
15.0000 mL | Freq: Once | OROMUCOSAL | Status: AC
  Filled 2022-10-07: qty 15

## 2022-10-07 MED ORDER — SERTRALINE HCL 100 MG PO TABS
100.0000 mg | ORAL_TABLET | Freq: Every day | ORAL | Status: DC
  Administered 2022-10-07: 100 mg via ORAL
  Filled 2022-10-07: qty 1

## 2022-10-07 MED ORDER — SODIUM CHLORIDE 0.9% FLUSH
3.0000 mL | Freq: Two times a day (BID) | INTRAVENOUS | Status: DC
  Administered 2022-10-08: 3 mL via INTRAVENOUS

## 2022-10-07 MED ORDER — LACTATED RINGERS IV SOLN: INTRAVENOUS | Status: DC

## 2022-10-07 MED ORDER — HEPARIN SODIUM (PORCINE) 1000 UNIT/ML IJ SOLN
INTRAMUSCULAR | Status: DC | PRN
  Administered 2022-10-07: 12000 [IU] via INTRAVENOUS

## 2022-10-07 MED ORDER — EPINEPHRINE 1 MG/10ML IJ SOSY
PREFILLED_SYRINGE | INTRAMUSCULAR | Status: DC | PRN
Start: 2022-10-07 — End: 2022-10-07
  Administered 2022-10-07: 5 ug via INTRAVENOUS

## 2022-10-07 MED ORDER — ALUM & MAG HYDROXIDE-SIMETH 200-200-20 MG/5ML PO SUSP
30.0000 mL | Freq: Three times a day (TID) | ORAL | Status: DC | PRN
  Administered 2022-10-07 – 2022-10-08 (×3): 30 mL via ORAL
  Filled 2022-10-07 (×3): qty 30

## 2022-10-07 MED ORDER — CHLORHEXIDINE GLUCONATE 0.12 % MT SOLN
OROMUCOSAL | Status: AC
  Administered 2022-10-07: 15 mL via OROMUCOSAL
  Filled 2022-10-07: qty 15

## 2022-10-07 MED ORDER — CHLORHEXIDINE GLUCONATE 4 % EX SOLN
60.0000 mL | Freq: Once | CUTANEOUS | Status: DC
  Filled 2022-10-07: qty 60

## 2022-10-07 MED ORDER — PANTOPRAZOLE SODIUM 40 MG PO TBEC
40.0000 mg | DELAYED_RELEASE_TABLET | Freq: Every day | ORAL | Status: DC
  Administered 2022-10-08: 40 mg via ORAL
  Filled 2022-10-07: qty 1

## 2022-10-07 MED ORDER — LIDOCAINE HCL (PF) 1 % IJ SOLN
INTRAMUSCULAR | Status: AC
  Filled 2022-10-07: qty 30

## 2022-10-07 MED ORDER — SODIUM CHLORIDE 0.9% FLUSH: 3.0000 mL | INTRAVENOUS | Status: DC | PRN

## 2022-10-07 MED ORDER — SODIUM CHLORIDE 0.9 % IV SOLN: INTRAVENOUS | Status: AC

## 2022-10-07 MED ORDER — CLEVIDIPINE BUTYRATE 0.5 MG/ML IV EMUL
INTRAVENOUS | Status: DC | PRN
  Administered 2022-10-07: 1 mg/h via INTRAVENOUS

## 2022-10-07 MED ORDER — ACETAMINOPHEN 325 MG PO TABS: 650.0000 mg | ORAL_TABLET | Freq: Four times a day (QID) | ORAL | Status: DC | PRN

## 2022-10-07 MED ORDER — FESOTERODINE FUMARATE ER 4 MG PO TB24
4.0000 mg | ORAL_TABLET | Freq: Every day | ORAL | Status: DC
  Administered 2022-10-08: 4 mg via ORAL
  Filled 2022-10-07: qty 1

## 2022-10-07 MED ORDER — ALPRAZOLAM 0.25 MG PO TABS
0.2500 mg | ORAL_TABLET | Freq: Every day | ORAL | Status: DC
  Administered 2022-10-07: 0.25 mg via ORAL
  Filled 2022-10-07: qty 1

## 2022-10-07 MED ORDER — PROTAMINE SULFATE 10 MG/ML IV SOLN
INTRAVENOUS | Status: AC
  Filled 2022-10-07: qty 5

## 2022-10-07 MED ORDER — SODIUM CHLORIDE 0.9 % IV SOLN: 250.0000 mL | INTRAVENOUS | Status: DC | PRN

## 2022-10-07 MED ORDER — PHENYLEPHRINE 80 MCG/ML (10ML) SYRINGE FOR IV PUSH (FOR BLOOD PRESSURE SUPPORT)
PREFILLED_SYRINGE | INTRAVENOUS | Status: DC | PRN
  Administered 2022-10-07: 160 ug via INTRAVENOUS

## 2022-10-07 MED ORDER — PROTAMINE SULFATE 10 MG/ML IV SOLN
INTRAVENOUS | Status: DC | PRN
Start: 2022-10-07 — End: 2022-10-07
  Administered 2022-10-07: 120 mg via INTRAVENOUS

## 2022-10-07 MED ORDER — ASPIRIN 81 MG PO TBEC
81.0000 mg | DELAYED_RELEASE_TABLET | Freq: Every day | ORAL | Status: DC
  Administered 2022-10-07: 81 mg via ORAL
  Filled 2022-10-07: qty 1

## 2022-10-07 MED ORDER — GABAPENTIN 100 MG PO CAPS
100.0000 mg | ORAL_CAPSULE | Freq: Three times a day (TID) | ORAL | Status: DC
  Administered 2022-10-07 – 2022-10-08 (×3): 100 mg via ORAL
  Filled 2022-10-07 (×3): qty 1

## 2022-10-07 MED ORDER — LACTATED RINGERS IV SOLN: INTRAVENOUS | Status: DC | PRN

## 2022-10-07 MED ORDER — FENTANYL CITRATE (PF) 100 MCG/2ML IJ SOLN
INTRAMUSCULAR | Status: AC
  Filled 2022-10-07: qty 2

## 2022-10-07 MED ORDER — SODIUM CHLORIDE 0.9 % IV SOLN: INTRAVENOUS | Status: DC

## 2022-10-07 MED ORDER — HEPARIN (PORCINE) IN NACL 1000-0.9 UT/500ML-% IV SOLN
INTRAVENOUS | Status: DC | PRN
  Administered 2022-10-07 (×3): 500 mL

## 2022-10-07 MED ORDER — LIDOCAINE HCL (PF) 1 % IJ SOLN
INTRAMUSCULAR | Status: DC | PRN
  Administered 2022-10-07 (×2): 15 mL

## 2022-10-07 MED ORDER — OXYCODONE HCL 5 MG PO TABS
5.0000 mg | ORAL_TABLET | ORAL | Status: DC | PRN
  Administered 2022-10-08: 10 mg via ORAL
  Filled 2022-10-07: qty 2

## 2022-10-07 MED ORDER — SIMVASTATIN 20 MG PO TABS
10.0000 mg | ORAL_TABLET | Freq: Every day | ORAL | Status: DC
  Administered 2022-10-07: 10 mg via ORAL
  Filled 2022-10-07: qty 1

## 2022-10-07 MED ORDER — MORPHINE SULFATE (PF) 2 MG/ML IV SOLN
1.0000 mg | INTRAVENOUS | Status: DC | PRN
  Administered 2022-10-08: 4 mg via INTRAVENOUS
  Filled 2022-10-07: qty 2

## 2022-10-07 MED ORDER — IOHEXOL 350 MG/ML SOLN
INTRAVENOUS | Status: DC | PRN
  Administered 2022-10-07: 40 mL

## 2022-10-07 MED ORDER — FENTANYL CITRATE (PF) 100 MCG/2ML IJ SOLN
INTRAMUSCULAR | Status: DC | PRN
  Administered 2022-10-07 (×4): 25 ug via INTRAVENOUS

## 2022-10-07 SURGICAL SUPPLY — 31 items
BAG SNAP BAND KOVER 36X36 (MISCELLANEOUS) ×4
CABLE ADAPT PACING TEMP 12FT (ADAPTER) ×2
CATH 23 ULTRA DELIVERY (CATHETERS) ×2
CATH DIAG 6FR PIGTAIL ANGLED (CATHETERS) ×2
CATH INFINITI 5FR ANG PIGTAIL (CATHETERS) ×2
CATH INFINITI 6F AL1 (CATHETERS) ×2
CATH S G BIP PACING (CATHETERS) ×2
CLOSURE MYNX CONTROL 6F/7F (Vascular Products) ×2 IMPLANT
CLOSURE PERCLOSE PROSTYLE (VASCULAR PRODUCTS) ×4
COVER DOME SNAP 22 D (MISCELLANEOUS) ×2
CRIMPER (MISCELLANEOUS) ×2
DEVICE INFLATION ATRION QL2530 (MISCELLANEOUS) ×2
ELECT DEFIB PAD ADLT CADENCE (PAD) ×2
KIT MICROPUNCTURE NIT STIFF (SHEATH) ×2
KIT SAPIAN 3 ULTRA RESILIA 23 (Valve) ×2 IMPLANT
PACK CARDIAC CATHETERIZATION (CUSTOM PROCEDURE TRAY) ×2
SET ATX-X65L (MISCELLANEOUS) ×2
SHEATH BRITE TIP 7FR 35CM (SHEATH) ×2
SHEATH INTRODUCER SET 20-26 (SHEATH) ×2
SHEATH PINNACLE 6F 10CM (SHEATH) ×2
SHEATH PINNACLE 8F 10CM (SHEATH) ×2
SHEATH PROBE COVER 6X72 (BAG) ×2
STOPCOCK MORSE 400PSI 3WAY (MISCELLANEOUS) ×2
TRANSDUCER W/STOPCOCK (MISCELLANEOUS) ×2
TUBING ART PRESS 72 MALE/FEM (TUBING) ×4
WIRE AMPLATZ SS-J .035X180CM (WIRE) ×2
WIRE EMERALD 3MM-J .035X150CM (WIRE) ×2
WIRE EMERALD 3MM-J .035X260CM (WIRE) ×2
WIRE EMERALD ST .035X260CM (WIRE) ×4
WIRE HI TORQ VERSACORE 300 (WIRE) ×2
WIRE SAFARI SM CURVE 275 (WIRE) ×2

## 2022-10-07 NOTE — Anesthesia Preprocedure Evaluation (Addendum)
Anesthesia Evaluation  Patient identified by MRN, date of birth, ID band Patient awake    Reviewed: Allergy & Precautions, NPO status , Patient's Chart, lab work & pertinent test results  History of Anesthesia Complications Negative for: history of anesthetic complications  Airway Mallampati: III  TM Distance: >3 FB Neck ROM: Limited    Dental  (+) Upper Dentures, Lower Dentures, Dental Advisory Given   Pulmonary former smoker   Pulmonary exam normal        Cardiovascular hypertension, Pt. on medications + DVT  + Valvular Problems/Murmurs AS  Rhythm:Regular Rate:Bradycardia + Systolic murmurs  '24 Cath -   Mid Cx lesion is 30% stenosed.   Ost LAD to Prox LAD lesion is 20% stenosed.   Mid LAD lesion is 30% stenosed.   Prox RCA lesion is 50% stenosed. Mild non-obstructive CAD Mild elevation right heart pressures PCWP 10 mmHg Mean PA pressure 33 Severe aortic stenosis by echo (I did not cross the aortic valve today).   '24 TTE - EF 60 to 65%. Grade I diastolic dysfunction (impaired relaxation). There is mildly elevated pulmonary artery systolic pressure. Left atrial size was severely dilated. Trivial mitral valve regurgitation. Severe aortic valve stenosis. Aortic valve mean gradient measures 45.7 mmHg. Aortic valve peak gradient measures 78.5 mmHg. Aortic valve area, by VTI measures 0.64 cm.      Neuro/Psych  PSYCHIATRIC DISORDERS Anxiety Depression     Neuromuscular disease    GI/Hepatic Neg liver ROS,GERD  Medicated and Controlled,, Crohn's disease    Endo/Other  negative endocrine ROS    Renal/GU Renal disease     Musculoskeletal  (+) Arthritis ,    Abdominal   Peds  Hematology negative hematology ROS (+)   Anesthesia Other Findings   Reproductive/Obstetrics                              Anesthesia Physical Anesthesia Plan  ASA: 4  Anesthesia Plan: General   Post-op  Pain Management: Tylenol PO (pre-op)*   Induction: Intravenous  PONV Risk Score and Plan: 3 and Treatment may vary due to age or medical condition, Ondansetron, TIVA and Propofol infusion  Airway Management Planned: Natural Airway  Additional Equipment: Arterial line  Intra-op Plan:   Post-operative Plan:   Informed Consent: I have reviewed the patients History and Physical, chart, labs and discussed the procedure including the risks, benefits and alternatives for the proposed anesthesia with the patient or authorized representative who has indicated his/her understanding and acceptance.     Dental advisory given  Plan Discussed with: CRNA  Anesthesia Plan Comments: (See PAT note. Patient with severe AS, being considered for TAVR. Per 08/11/22 Preoperative Evaluation visit with Jacolyn Reedy, PA-C, "Lung nodule needs biopsy before surgery-higher risk for anesthesia with severe AS but needs to be done before TAVR. Dr. Eden Emms and Dr. Delton Coombes have spoken and cleared her for lung biopsy. According to the Revised Cardiac Risk Index (RCRI), her Perioperative Risk of Major Cardiac Event is (%): 0.9 Her Functional Capacity in METs is: 4.73 according to the Duke Activity Status Index (DASI).")       Anesthesia Quick Evaluation

## 2022-10-07 NOTE — Discharge Instructions (Signed)

## 2022-10-07 NOTE — Progress Notes (Signed)
  Echocardiogram 2D Echocardiogram has been performed.  Janalyn Harder 10/07/2022, 4:45 PM

## 2022-10-07 NOTE — Transfer of Care (Signed)
Immediate Anesthesia Transfer of Care Note  Patient: Barbara Lucero  Procedure(s) Performed: Transcatheter Aortic Valve Replacement, Transfemoral (Right) INTRAOPERATIVE TRANSTHORACIC ECHOCARDIOGRAM  Patient Location: Cath Lab  Anesthesia Type:MAC  Level of Consciousness: awake, alert , and oriented  Airway & Oxygen Therapy: Patient Spontanous Breathing  Post-op Assessment: Report given to RN and Post -op Vital signs reviewed and stable  Post vital signs: Reviewed and stable  Last Vitals:  Vitals Value Taken Time  BP 92/43 10/07/22 1704  Temp 36.2 C 10/07/22 1704  Pulse 57 10/07/22 1715  Resp 12 10/07/22 1715  SpO2 93 % 10/07/22 1715  Vitals shown include unfiled device data.  Last Pain:  Vitals:   10/07/22 1704  TempSrc: Tympanic  PainSc: 0-No pain         Complications: There were no known notable events for this encounter.

## 2022-10-07 NOTE — Discharge Summary (Incomplete)
[facility]   MULTIDISCIPLINARY HEART VALVE TEAM  Discharge Summary    Patient ID: Barbara Lucero MRN: [id number]; DOB: [birth year]  Admit date: 10/07/2022 Discharge date: 10/08/2022  Primary Care Provider: [name], MD  Primary Cardiologist: Charlton Haws, MD / Dr. Eden Emms, MD & Dr. Laneta Simmers, MD (TAVR)  Discharge Diagnoses    Principal Problem:   S/P TAVR (transcatheter aortic valve replacement) Active Problems:   Severe aortic stenosis   Bronchogenic carcinoma (HCC)   HTN (hypertension)   Hypoxia  Allergies Allergies  Allergen Reactions   Coconut (Cocos Nucifera) Hives   Latex Hives   Diagnostic Studies/Procedures    [facility]  TAVR OPERATIVE NOTE     Date of Procedure:                10/07/2022   Preoperative Diagnosis:      Severe Aortic Stenosis    Postoperative Diagnosis:    Same    Procedure:        Transcatheter Aortic Valve Replacement - Transfemoral Approach             Edwards Sapien 3 THV (size 23 mm, model # [id number], serial # [id number] )              Co-Surgeons:                        Verne Carrow, MD and Eugenio Hoes , MD    Anesthesiologist:                  Chaney Malling   Echocardiographer:              Izora Ribas   Pre-operative Echo Findings: Severe aortic stenosis Normal left ventricular systolic function   Post-operative Echo Findings: Trivial paravalvular leak Normal left ventricular systolic function _____________   Echo 10/08/2022:   1. Compared to 10/07/22 mean gradient across TAVR valve is similar (14  mmHg) with DI 0.56; AI not evident on present study.   2. Left ventricular ejection fraction, by estimation, is 70 to 75%. The  left ventricle has hyperdynamic function. The left ventricle has no  regional wall motion abnormalities. The left ventricular internal cavity  size was mildly dilated. There is mild  concentric left ventricular hypertrophy. Left ventricular diastolic  parameters are  consistent with Grade I diastolic dysfunction (impaired  relaxation). Elevated left atrial pressure.   3. Right ventricular systolic function is normal. The right ventricular  size is normal.   4. Left atrial size was mildly dilated.   5. The mitral valve is normal in structure. No evidence of mitral valve  regurgitation. No evidence of mitral stenosis. Severe mitral annular  calcification.   6. The aortic valve has been repaired/replaced. Aortic valve  regurgitation is not visualized. No aortic stenosis is present. There is a  23 mm Edwards Sapien prosthetic (TAVR) valve present in the aortic  position. Procedure Date: 10/07/22.   7. The inferior vena cava is normal in size with greater than 50%  respiratory variability, suggesting right atrial pressure of 3 mmHg.   History of Present Illness     Barbara Lucero is a 82 y.o. female with a history of anxiety, depression, Crohn's disease, prior DVT, HTN, HLD and severe aortic stenosis who presented today for planned TAVR.    Barbara Lucero had an echocardiogram 04/2022 that showed LVEF 60-65% and severe aortic stenosis with calcified, thickened leaflets, mean gradient 45.7 mmHg,  peak gradient 78.5 mmHg, AVA 0.64 cm2, DI 0.20. She was referred to Dr. Pearletha Alfred 06/23/22 to be considered for TAVR. R/LHC showed nonobstructive CAD. Pre TAVR CTs showed anatomy amendable for a 23 mm Edwards S3UR via the right TF approach. It also showed an enlarging pulmonary nodule highly suspicious for malignancy. She was evaluated by Dr. Delton Coombes and underwent navigational bronchoscopy on 09/01/22. Dr. Delton Coombes reviewed this with tumor board and it was felt best to send her for a PET scan and refer to the cancer center.   Hospital Course     Severe AS: s/p successful TAVR with a 23 mm Edwards Sapien 3 THV via the TF  approach on 10/07/22. Post operative echo today with normal LV function, mean gradient at , peak , AVA by VTI at 1.59cm2, and DI of 0.56.  Groin sites are  stable. Patient restarted on ASA 81mg  monotherapy post procedure. Discharge instructions reviewed with understanding. CRI attempted several times today to ambulate the patient with refusal however was able to ambulate late afternoon. See plan for supplemental oxygen below. She will require lifelong dental SBE and should refrain from dental procedures or cleanings for the next 30 days. SBE to be further discussed and RX'ed at follow up. Plan close [facility] office visit next week and one month with repeat echocardiogram thereafter.   Hypoxia: Patient has required supplemental oxygen of 2L overnight and into today. Daughter at bedside who reports this has been a chronic issue secondary to recent finding of bronchogenic carcinoma. Will plan for home oxygen 2L.   HTN: Continue current dose of irbesartan and follow closely.    HLD: Continue statin    Previous DVT: No longer on OAC.    Pulmonary nodule: Pre TAVR CT imaging with suspicious lung nodule. Underwent navigational bronch 09/01/22 with results suspicious for slow growing malignancy. 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 Not felt to be a good resection candidate therefore plan for radiation oncology referral per Dr. Arbutus Ped for consideration of SBRT.   Consultants: None    The patient has been seen and examined by Dr. Clifton James who feels that she is stable and ready for discharge today, 10/08/22.  _____________  Discharge Vitals Blood pressure (!) 97/45, pulse 65, temperature 98.2 F (36.8 C), temperature source Oral, resp. rate 18, height 5\' 1"  (1.549 m), weight 78.6 kg, SpO2 96%.  Filed Weights   10/07/22 1058 10/07/22 2213 10/08/22 0711  Weight: 79.4 kg 78.6 kg 78.6 kg   General: Well developed, well nourished, NAD Skin: Warm, dry, intact. Groin site stable with no evidence of bleeding or hematoma Lungs:Clear to ausculation bilaterally. No wheezes, rales, or rhonchi. Breathing is  unlabored. Cardiovascular: RRR with S1 S2. No murmurs Abdomen: Soft, non-tender, non-distended Extremities: No edema.  Neuro: Alert and oriented. No focal deficits. No facial asymmetry. MAE spontaneously. Psych: Responds to questions appropriately with normal affect.    Labs & Radiologic Studies    CBC Recent Labs    10/07/22 1645 10/08/22 0119  WBC  --  7.5  HGB 9.2* 10.3*  HCT 27.0* 32.2*  MCV  --  91.2  PLT  --  115*   Basic Metabolic Panel Recent Labs    82/95/62 1645 10/08/22 0119  NA 138 136  K 4.1 3.7  CL 101 103  CO2  --  27  GLUCOSE 141* 97  BUN 16 12  CREATININE 0.90 0.87  CALCIUM  --  8.5*  MG  --  1.9  Liver Function Tests No results for input(s): "AST", "ALT", "ALKPHOS", "BILITOT", "PROT", "ALBUMIN" in the last 72 hours. No results for input(s): "LIPASE", "AMYLASE" in the last 72 hours. Cardiac Enzymes No results for input(s): "CKTOTAL", "CKMB", "CKMBINDEX", "TROPONINI" in the last 72 hours. BNP Invalid input(s): "POCBNP" D-Dimer No results for input(s): "DDIMER" in the last 72 hours. Hemoglobin A1C No results for input(s): "HGBA1C" in the last 72 hours. Fasting Lipid Panel No results for input(s): "CHOL", "HDL", "LDLCALC", "TRIG", "CHOLHDL", "LDLDIRECT" in the last 72 hours. Thyroid Function Tests No results for input(s): "TSH", "T4TOTAL", "T3FREE", "THYROIDAB" in the last 72 hours.  Invalid input(s): "FREET3" _____________  ECHOCARDIOGRAM COMPLETE  Result Date: 10/08/2022    ECHOCARDIOGRAM REPORT   Patient Name:   Barbara Lucero Date of Exam: 10/08/2022 Medical Rec #:  [id number]     Height:       61.0 in Accession #:    [id number]    Weight:       173.3 lb Date of Birth:  09/26/1940      BSA:          1.777 m Patient Age:    82 years      BP:           97/53 mmHg Patient Gender: F             HR:           73 bpm. Exam Location:  Inpatient Procedure: 2D Echo, Cardiac Doppler and Color Doppler Indications:    Post TAVR evaluation V43.3/Z95.2   History:        Patient has prior history of Echocardiogram examinations, most                 recent 10/07/2022. Signs/Symptoms:Hypotension; Risk                 Factors:Hypertension and Dyslipidemia.                 Aortic Valve: 23 mm Edwards Sapien prosthetic, stented (TAVR)                 valve is present in the aortic position. Procedure Date:                 10/07/22.  Sonographer:    Lucendia Herrlich Referring Phys: [id number] KATHRYN R THOMPSON IMPRESSIONS  1. Compared to 10/07/22 mean gradient across TAVR valve is similar (14 mmHg) with DI 0.56; AI not evident on present study.  2. Left ventricular ejection fraction, by estimation, is 70 to 75%. The left ventricle has hyperdynamic function. The left ventricle has no regional wall motion abnormalities. The left ventricular internal cavity size was mildly dilated. There is mild concentric left ventricular hypertrophy. Left ventricular diastolic parameters are consistent with Grade I diastolic dysfunction (impaired relaxation). Elevated left atrial pressure.  3. Right ventricular systolic function is normal. The right ventricular size is normal.  4. Left atrial size was mildly dilated.  5. The mitral valve is normal in structure. No evidence of mitral valve regurgitation. No evidence of mitral stenosis. Severe mitral annular calcification.  6. The aortic valve has been repaired/replaced. Aortic valve regurgitation is not visualized. No aortic stenosis is present. There is a 23 mm Edwards Sapien prosthetic (TAVR) valve present in the aortic position. Procedure Date: 10/07/22.  7. The inferior vena cava is normal in size with greater than 50% respiratory variability, suggesting right atrial pressure of 3 mmHg. FINDINGS  Left Ventricle: Left ventricular ejection  fraction, by estimation, is 70 to 75%. The left ventricle has hyperdynamic function. The left ventricle has no regional wall motion abnormalities. The left ventricular internal cavity size was mildly dilated.  There is mild concentric left ventricular hypertrophy. Left ventricular diastolic parameters are consistent with Grade I diastolic dysfunction (impaired relaxation). Elevated left atrial pressure. Right Ventricle: The right ventricular size is normal. Right ventricular systolic function is normal. Left Atrium: Left atrial size was mildly dilated. Right Atrium: Right atrial size was normal in size. Pericardium: There is no evidence of pericardial effusion. Mitral Valve: The mitral valve is normal in structure. Severe mitral annular calcification. No evidence of mitral valve regurgitation. No evidence of mitral valve stenosis. Tricuspid Valve: The tricuspid valve is normal in structure. Tricuspid valve regurgitation is trivial. No evidence of tricuspid stenosis. Aortic Valve: The aortic valve has been repaired/replaced. Aortic valve regurgitation is not visualized. No aortic stenosis is present. Aortic valve mean gradient measures 14.0 mmHg. Aortic valve peak gradient measures 28.0 mmHg. Aortic valve area, by VTI measures 1.59 cm. There is a 23 mm Edwards Sapien prosthetic, stented (TAVR) valve present in the aortic position. Procedure Date: 10/07/22. Pulmonic Valve: The pulmonic valve was normal in structure. Pulmonic valve regurgitation is not visualized. No evidence of pulmonic stenosis. Aorta: The aortic root is normal in size and structure. Venous: The inferior vena cava is normal in size with greater than 50% respiratory variability, suggesting right atrial pressure of 3 mmHg. IAS/Shunts: No atrial level shunt detected by color flow Doppler. Additional Comments: Compared to 10/07/22 mean gradient across TAVR valve is similar (14 mmHg) with DI 0.56; AI not evident on present study.  LEFT VENTRICLE PLAX 2D LVIDd:         4.85 cm   Diastology LVIDs:         3.00 cm   LV e' medial:    5.00 cm/s LV PW:         1.05 cm   LV E/e' medial:  17.2 LV IVS:        1.20 cm   LV e' lateral:   7.94 cm/s LVOT diam:     1.90 cm    LV E/e' lateral: 10.8 LV SV:         81 LV SV Index:   46 LVOT Area:     2.84 cm  RIGHT VENTRICLE             IVC RV S prime:     13.50 cm/s  IVC diam: 1.50 cm TAPSE (M-mode): 1.9 cm LEFT ATRIUM             Index        RIGHT ATRIUM           Index LA diam:        4.15 cm 2.34 cm/m   RA Area:     13.40 cm LA Vol (A2C):   59.3 ml 33.37 ml/m  RA Volume:   30.55 ml  17.19 ml/m LA Vol (A4C):   63.7 ml 35.85 ml/m LA Biplane Vol: 61.4 ml 34.55 ml/m  AORTIC VALVE AV Area (Vmax):    1.62 cm AV Area (Vmean):   1.63 cm AV Area (VTI):     1.59 cm AV Vmax:           264.67 cm/s AV Vmean:          169.667 cm/s AV VTI:            0.511 m AV  Peak Grad:      28.0 mmHg AV Mean Grad:      14.0 mmHg LVOT Vmax:         151.00 cm/s LVOT Vmean:        97.600 cm/s LVOT VTI:          0.287 m LVOT/AV VTI ratio: 0.56  AORTA Ao Root diam: 3.10 cm Ao Asc diam:  3.00 cm MITRAL VALVE                TRICUSPID VALVE MV Area (PHT): 2.76 cm     TR Peak grad:   23.2 mmHg MV Decel Time: 275 msec     TR Vmax:        241.00 cm/s MR Peak grad: 18.3 mmHg MR Vmax:      214.00 cm/s   SHUNTS MV E velocity: 85.90 cm/s   Systemic VTI:  0.29 m MV A velocity: 131.00 cm/s  Systemic Diam: 1.90 cm MV E/A ratio:  0.66 Olga Millers MD Electronically signed by Olga Millers MD Signature Date/Time: 10/08/2022/10:59:09 AM    Final    ECHOCARDIOGRAM LIMITED  Result Date: 10/07/2022    ECHOCARDIOGRAM LIMITED REPORT   Patient Name:   ESMAE HURTS Date of Exam: 10/07/2022 Medical Rec #:  [id number]     Height:       61.0 in Accession #:    [id number]    Weight:       175.0 lb Date of Birth:  22-May-1940      BSA:          1.785 m Patient Age:    82 years      BP:           157/66 mmHg Patient Gender: F             HR:           72 bpm. Exam Location:  Inpatient Procedure: Limited Echo, Cardiac Doppler and Color Doppler Indications:     I35.0 Nonrheumatic aortic (valve) stenosis  History:         Patient has prior history of Echocardiogram examinations, most                   recent 05/15/2022. Pacemaker, Signs/Symptoms:Hypotension; Risk                  Factors:Hypertension. Severe aortic stenosis.                  Aortic Valve: 23 mm Edwards Sapien prosthetic, stented (TAVR)                  valve is present in the aortic position. Procedure Date:                  10/07/2022.  Sonographer:     Sheralyn Boatman RDCS Referring Phys:  [id number] Kathleene Hazel Diagnosing Phys: Riley Lam MD IMPRESSIONS  1. Prior to procedure, severe aortic stenosis. Mean gradient 47 mm Hg, DVI 0.22, AVA 0.68 cm2. A 23 mm Sapien valve was deployed. After this, trivial PVL adjacent to the intraventricular septum Mean gradient of 9 mm Hg (reasoable for valve size and patient), Peak gradient of 17 mm Hg, DVI 0.62 with an EOA 2.74 cm2.. The aortic valve has been repaired/replaced. There is a 23 mm Edwards Sapien prosthetic (TAVR) valve present in the aortic position. Procedure Date: 10/07/2022. Echo findings are consistent with normal structure and function of the aortic valve prosthesis.  2. Left ventricular ejection fraction, by estimation, is 60 to 65%. The left ventricle has normal function.  3. The mitral valve is grossly normal. Mild to moderate mitral valve regurgitation. Moderate mitral annular calcification. FINDINGS  Left Ventricle: Left ventricular ejection fraction, by estimation, is 60 to 65%. The left ventricle has normal function. Pericardium: There is no evidence of pericardial effusion. Mitral Valve: The mitral valve is grossly normal. Moderate mitral annular calcification. Mild to moderate mitral valve regurgitation. MV peak gradient, 9.0 mmHg. The mean mitral valve gradient is 5.0 mmHg. Tricuspid Valve: The tricuspid valve is normal in structure. Tricuspid valve regurgitation is trivial. Aortic Valve: Prior to procedure, severe aortic stenosis. Mean gradient 47 mm Hg, DVI 0.22, AVA 0.68 cm2. A 23 mm Sapien valve was deployed. After this, trivial PVL adjacent to the  intraventricular septum Mean gradient of 9 mm Hg (reasoable for valve size and patient), Peak gradient of 17 mm Hg, DVI 0.62 with an EOA 2.74 cm2. The aortic valve has been repaired/replaced. Aortic valve mean gradient measures 9.0 mmHg. Aortic valve peak gradient measures 16.6 mmHg. Aortic valve area, by VTI measures 2.75  cm. There is a 23 mm Edwards Sapien prosthetic, stented (TAVR) valve present in the aortic position. Procedure Date: 10/07/2022. Echo findings are consistent with normal structure and function of the aortic valve prosthesis. Aorta: The aortic root and ascending aorta are structurally normal, with no evidence of dilitation. Additional Comments: Spectral Doppler performed. Color Doppler performed.  LEFT VENTRICLE PLAX 2D LVOT diam:     2.30 cm LV SV:         136 LV SV Index:   76 LVOT Area:     4.15 cm  LV Volumes (MOD) LV vol d, MOD A2C: 78.3 ml LV vol d, MOD A4C: 72.9 ml LV vol s, MOD A2C: 27.9 ml LV vol s, MOD A4C: 24.8 ml LV SV MOD A2C:     50.4 ml LV SV MOD A4C:     72.9 ml LV SV MOD BP:      49.6 ml AORTIC VALVE AV Area (Vmax):    2.57 cm AV Area (Vmean):   2.67 cm AV Area (VTI):     2.75 cm AV Vmax:           204.00 cm/s AV Vmean:          136.000 cm/s AV VTI:            0.496 m AV Peak Grad:      16.6 mmHg AV Mean Grad:      9.0 mmHg LVOT Vmax:         126.00 cm/s LVOT Vmean:        87.500 cm/s LVOT VTI:          0.328 m LVOT/AV VTI ratio: 0.66 MITRAL VALVE MV Area VTI:  3.22 cm    SHUNTS MV Peak grad: 9.0 mmHg    Systemic VTI:  0.33 m MV Mean grad: 5.0 mmHg    Systemic Diam: 2.30 cm MV Vmax:      1.50 m/s MV Vmean:     110.0 cm/s Riley Lam MD Electronically signed by Riley Lam MD Signature Date/Time: 10/07/2022/5:36:56 PM    Final    Structural Heart Procedure  Result Date: 10/07/2022 See surgical note for result.  DG Chest 2 View  Result Date: 10/05/2022 CLINICAL DATA:  Severe aortic stenosis.  Preop. EXAM: CHEST - 2 VIEW COMPARISON:  Head CT 09/26/2022  reviewed radiograph 09/01/2022. CT  07/10/2022 FINDINGS: Heart is upper normal in size. Unchanged mediastinal contours, aortic atherosclerosis and tortuosity. Right suprahilar nodule with fiducial markers grossly stable. Chronic peribronchial thickening. No focal airspace disease, pleural effusion or pneumothorax. Prominent thoracolumbar scoliosis. IMPRESSION: 1. No acute chest findings. 2. Chronic peribronchial thickening. 3. Right upper lobe nodule with fiducial marker, recently assessed on PET. Electronically Signed   By: Narda Rutherford M.D.   On: 10/05/2022 09:42   NM PET Image Initial (PI) Skull Base To Thigh  Result Date: 10/01/2022 CLINICAL DATA:  Initial treatment strategy for non-small cell lung cancer. EXAM: NUCLEAR MEDICINE PET SKULL BASE TO THIGH TECHNIQUE: 8.6 mCi F-18 FDG was injected intravenously. Full-ring PET imaging was performed from the skull base to thigh after the radiotracer. CT data was obtained and used for attenuation correction and anatomic localization. Fasting blood glucose: 87 mg/dl COMPARISON:  CTA chest dated 07/10/2022 FINDINGS: Mediastinal blood pool activity: SUV max 2.7 Liver activity: SUV max NA NECK: No hypermetabolic cervical lymphadenopathy. Incidental CT findings: None. CHEST: 1.8 x 1.5 cm solid nodule in the posterior right upper lobe (series 7/image 19), max SUV 12.8. Adjacent fiducial marker. This corresponds to the patient's known primary bronchogenic carcinoma. No hypermetabolic thoracic lymphadenopathy. Incidental CT findings: Atherosclerotic calcifications the aortic arch and root/valve. Move severe 3 vessel coronary atherosclerosis. Mitral valve annular calcifications. ABDOMEN/PELVIS: Focal hypermetabolism along the posterior aspect of the gastric antrum, with associated mild wall thickening on CT (series 4/image 102), max SUV 5.6. While nonspecific, a subtle primary gastric lesion is possible. No abnormal hypermetabolism in the liver, spleen, pancreas, or  adrenal glands. No abnormality abdominopelvic lymphadenopathy. Incidental CT findings: Cholelithiasis (series 4/image 109), without associated inflammatory changes. Atherosclerotic calcifications the abdominal aorta and branch vessels. Pelvic floor pessary. SKELETON: Mild sternal hypermetabolism, max SUV 2.3, without CT correlate. This appearance is not considered likely to reflect isolated osseous metastasis. Otherwise, no focal hypermetabolic activity to suggest skeletal metastases. Incidental CT findings: Degenerative changes of the visualized thoracolumbar spine with mild lumbar levoscoliosis. Left hip arthroplasty. IMPRESSION: 1.8 cm right upper lobe nodule, corresponding to the patient's known primary bronchogenic carcinoma. Mild sternal hypermetabolism, nonspecific, not considered likely to reflect isolated osseous metastasis. Mild hypermetabolism along the posterior gastric antrum, subtle primary gastric lesion not excluded. Endoscopy is suggested for further evaluation. No findings specific for metastatic disease. Electronically Signed   By: Charline Bills M.D.   On: 10/01/2022 23:38   Disposition   Pt is being discharged home today in good condition.  Follow-up Plans & Appointments    Follow-up Information     Filbert Schilder, NP. Go on 10/20/2022.   Specialty: Cardiology Why: @ 8:40am. Contact information: [address] [place] [address] [phone]                Discharge Instructions     (HEART FAILURE PATIENTS) Call MD:  Anytime you have any of the following symptoms: 1) 3 pound weight gain in 24 hours or 5 pounds in 1 week 2) shortness of breath, with or without a dry hacking cough 3) swelling in the hands, feet or stomach 4) if you have to sleep on extra pillows at night in order to breathe.   Complete by: As directed    Amb Referral to Cardiac Rehabilitation   Complete by: As directed    Diagnosis: Valve Replacement   Valve: Aortic   After initial  evaluation and assessments completed: Virtual Based Care may be provided alone or in conjunction with  Phase 2 Cardiac Rehab based on patient barriers.: Yes   Intensive Cardiac Rehabilitation (ICR) [facility] location only OR Traditional Cardiac Rehabilitation (TCR) *If criteria for ICR are not met will enroll in [facility] only): Yes   Call MD for:  difficulty breathing, headache or visual disturbances   Complete by: As directed    Call MD for:  extreme fatigue   Complete by: As directed    Call MD for:  hives   Complete by: As directed    Call MD for:  persistant dizziness or light-headedness   Complete by: As directed    Call MD for:  persistant nausea and vomiting   Complete by: As directed    Call MD for:  redness, tenderness, or signs of infection (pain, swelling, redness, odor or green/yellow discharge around incision site)   Complete by: As directed    Call MD for:  severe uncontrolled pain   Complete by: As directed    Call MD for:  temperature >100.4   Complete by: As directed    Diet - low sodium heart healthy   Complete by: As directed    Discharge instructions   Complete by: As directed    ACTIVITY AND EXERCISE  Daily activity and exercise are an important part of your recovery. People recover at different rates depending on their general health and type of valve procedure.  Most people recovering from TAVR feel better relatively quickly   No lifting, pushing, pulling more than 10 pounds (examples to avoid: groceries, vacuuming, gardening, golfing):             - For one week with a procedure through the groin.             - For six weeks for procedures through the chest wall or neck. NOTE: You will typically see one of our providers 7-14 days after your procedure to discuss WHEN TO RESUME the above activities.      DRIVING  Do not drive until you are seen for follow up and cleared by a provider. Generally, we ask patient to not drive for 1 week after their procedure.  If you  have been told by your doctor in the past that you may not drive, you must talk with him/her before you begin driving again.   DRESSING  Groin site: you may leave the clear dressing over the site for up to one week or until it falls off.   HYGIENE  If you had a femoral (leg) procedure, you may take a shower when you return home. After the shower, pat the site dry. Do NOT use powder, oils or lotions in your groin area until the site has completely healed.  If you had a chest procedure, you may shower when you return home unless specifically instructed not to by your discharging practitioner.             - DO NOT scrub incision; pat dry with a towel.             - DO NOT apply any lotions, oils, powders to the incision.             - No tub baths / swimming for at least 2 weeks.  If you notice any fevers, chills, increased pain, swelling, bleeding or pus, please contact your doctor.   ADDITIONAL INFORMATION  If you are going to have an upcoming dental procedure, please contact our office as you will require antibiotics ahead of time to  prevent infection on your heart valve.    If you have any questions or concerns you can call the structural heart phone during normal business hours 8am-4pm. If you have an urgent need after hours or weekends please call [phone] to talk to the on call provider for general cardiology. If you have an emergency that requires immediate attention, please call 911.    After TAVR Checklist  Check  Test Description  Follow up appointment in 1-2 weeks  You will see our structural heart advanced practice provider. Your incision sites will be checked and you will be cleared to drive and resume all normal activities if you are doing well.    1 month echo and follow up  You will have an echo to check on your new heart valve and be seen back in the office by a structural heart advanced practice provider.  Follow up with your primary cardiologist You will need to be  seen by your primary cardiologist in the following 3-6 months after your 1 month appointment in the valve clinic.   1 year echo and follow up You will have another echo to check on your heart valve after 1 year and be seen back in the office by a structural heart advanced practice provider. This your last structural heart visit.  Bacterial endocarditis prophylaxis  You will have to take antibiotics for the rest of your life before all dental procedures (even teeth cleanings) to protect your heart valve. Antibiotics are also required before some surgeries. Please check with your cardiologist before scheduling any surgeries. Also, please make sure to tell us if you have a penicillin allergy as you will require an alternative antibiotic.   Increase activity slowly   Complete by: As directed    Remove dressing in 24 hours   Complete by: As directed    Can change to Band-Aids after 1-2 days after discharge.      Discharge Medications   Allergies as of 10/08/2022       Reactions   Coconut (cocos Nucifera) Hives   Latex Hives        Medication List     TAKE these medications    acetaminophen 500 MG tablet Commonly known as: TYLENOL Take 1,000 mg by mouth 2 (two) times daily.   albuterol 108 (90 Base) MCG/ACT inhaler Commonly known as: VENTOLIN HFA Inhale 2 puffs into the lungs every 4 (four) hours as needed for shortness of breath or wheezing.   ALPRAZolam 0.25 MG tablet Commonly known as: XANAX Take 0.25 mg by mouth at bedtime.   aspirin EC 81 MG tablet Take 81 mg by mouth at bedtime. Swallow whole.   b complex vitamins tablet Take 1 tablet by mouth daily.   Calcium 600+D 600-20 MG-MCG Tabs Generic drug: Calcium Carb-Cholecalciferol Take 1 tablet by mouth daily.   cyanocobalamin 1000 MCG tablet Commonly known as: VITAMIN B12 Take 1,000 mcg by mouth daily as needed (energy).   furosemide 20 MG tablet Commonly known as: LASIX Take 20 mg by mouth every Wednesday.    gabapentin 100 MG capsule Commonly known as: NEURONTIN Take 1 capsule (100 mg total) by mouth 2 (two) times daily. What changed: when to take this   hydrocortisone cream 1 % Apply 1 Application topically 2 (two) times daily as needed (rash).   METAMUCIL PO Take 1 Dose by mouth daily as needed (constipation). 1 dose = 1 teaspoonful   multivitamin with minerals tablet Take 1 tablet by mouth daily.  Hair Skin & Nails Tabs Take 3 tablets by mouth daily.   olmesartan 40 MG tablet Commonly known as: BENICAR Take 40 mg by mouth daily.   pantoprazole 40 MG tablet Commonly known as: PROTONIX Take 40 mg by mouth daily.   sertraline 100 MG tablet Commonly known as: ZOLOFT Take 100 mg by mouth at bedtime.   simvastatin 10 MG tablet Commonly known as: ZOCOR Take 10 mg by mouth at bedtime.   solifenacin 10 MG tablet Commonly known as: VESICARE Take 10 mg by mouth at bedtime.   sulfaSALAzine 500 MG tablet Commonly known as: AZULFIDINE Take 1 tablet (500 mg total) by mouth 2 (two) times daily.   vitamin C 1000 MG tablet Take 1,000 mg by mouth daily.   Vitamin D3 50 MCG (2000 UT) capsule Take 2,000 Units by mouth daily.   vitamin E 180 MG (400 UNITS) capsule Take 400 Units by mouth daily.               Durable Medical Equipment  (From admission, onward)           Start     Ordered   10/08/22 1431  For home use only DME oxygen  Once       Question Answer Comment  Length of Need 12 Months   Mode or (Route) Nasal cannula   Liters per Minute 2   Oxygen conserving device Yes   Oxygen delivery system Gas      10/08/22 1430            Outstanding Labs/Studies   None   Duration of Discharge Encounter   Greater than 30 minutes including physician time.  Signed, Georgie Chard, NP 10/08/2022, 2:41 PM [phone]   I have personally seen and examined this patient. I agree with the assessment and plan as outlined above.  Pt doing well post  TAVR.Groins stable. BP stable. Sinus on tele. D/c home.   Verne Carrow, MD, [facility] 10/09/2022 11:44 AM

## 2022-10-07 NOTE — Op Note (Signed)
HEART AND VASCULAR CENTER   MULTIDISCIPLINARY HEART VALVE TEAM   TAVR OPERATIVE NOTE   Date of Procedure:  10/07/2022  Preoperative Diagnosis: Severe Aortic Stenosis   Postoperative Diagnosis: Same   Procedure:   Transcatheter Aortic Valve Replacement - Percutaneous right Transfemoral Approach  Edwards Sapien 3 Ultra THV (size 23 mm, model # [id number] serial # [id number])   Co-Surgeons:  Eugenio Hoes MD and Verne Carrow  Anesthesiologist:  Lewie Loron MD  Echocardiographer:  Dr Lenor Derrick  Pre-operative Echo Findings: Severe aortic stenosis normal left ventricular systolic function  Post-operative Echo Findings: no paravalvular leak normal left ventricular systolic function   BRIEF CLINICAL NOTE AND INDICATIONS FOR SURGERY  82 yo female with NYHA class II symptoms of severe AS with normal LV function without CAD. With her age and comorbidities I feel she is an ideal candidate for TAVR and would use right femoral artery and sapien 23 mm valve     DETAILS OF THE OPERATIVE PROCEDURE  PREPARATION:    The patient was brought to the operating room on the above mentioned date and appropriate monitoring was established by the anesthesia team. The patient was placed in the supine position on the operating table.  Intravenous antibiotics were administered. The patient was monitored closely throughout the procedure under conscious sedation. Baseline transthoracic echocardiogram was performed. The patient's abdomen and both groins were prepped and draped in a sterile manner. A time out procedure was performed.   PERIPHERAL ACCESS:    Using the modified Seldinger technique, femoral arterial and venous access was obtained with placement of 6 Fr sheaths on the left side.  A pigtail diagnostic catheter was passed through the left arterial sheath under fluoroscopic guidance into the aortic root.  A temporary transvenous pacemaker catheter was passed through the left femoral  venous sheath under fluoroscopic guidance into the right ventricle.  The pacemaker was tested to ensure stable lead placement and pacemaker capture. Aortic root angiography was performed in order to determine the optimal angiographic angle for valve deployment.   TRANSFEMORAL ACCESS:   Percutaneous transfemoral access and sheath placement was performed using ultrasound guidance.  The right common femoral artery was cannulated using a micropuncture needle and appropriate location was verified using hand injection angiogram.  A pair of Abbott Perclose percutaneous closure devices were placed and a 6 French sheath replaced into the femoral artery.  The patient was heparinized systemically and ACT verified > 250 seconds.    A 14 Fr transfemoral E-sheath was introduced into the right common femoral artery after progressively dilating over an Amplatz superstiff wire. An AL1 catheter was used to direct a straight-tip exchange length wire across the native aortic valve into the left ventricle. This was exchanged out for a pigtail catheter and position was confirmed in the LV apex. Simultaneous LV and Ao pressures were recorded. LVEDP was 25 mmHg The pigtail catheter was exchanged for a Safari wire in the LV apex.   BALLOON AORTIC VALVULOPLASTY:   Not done  TRANSCATHETER HEART VALVE DEPLOYMENT:   An Edwards Sapien 3 Ultra transcatheter heart valve (size 23 mm) was prepared and crimped per manufacturer's guidelines, and the proper orientation of the valve is confirmed on the [employer] delivery system. The valve was advanced through the introducer sheath using normal technique until in an appropriate position in the abdominal aorta beyond the sheath tip. The balloon was then retracted and using the fine-tuning wheel was centered on the valve. The valve was then advanced across the aortic arch  using appropriate flexion of the catheter. The valve was carefully positioned across the aortic valve annulus.  The Commander catheter was retracted using normal technique. Once final position of the valve has been confirmed by angiographic assessment, the valve is deployed during rapid ventricular pacing to maintain systolic blood pressure < 50 mmHg and pulse pressure < 10 mmHg. The balloon inflation is held for >3 seconds after reaching full deployment volume. Once the balloon has fully deflated the balloon is retracted into the ascending aorta and valve function is assessed using echocardiography. There is felt to be no paravalvular leak and no central aortic insufficiency.  The patient's hemodynamic recovery following valve deployment is good.  The deployment balloon and guidewire are both removed.    PROCEDURE COMPLETION:   The sheath was removed and femoral artery closure performed.  Protamine was administered once femoral arterial repair was complete. The temporary pacemaker, pigtail catheter and femoral sheaths were removed with manual pressure used for venous hemostasis.  A Mynx femoral closure device was utilized following removal of the diagnostic sheath in the left femoral artery.  The patient tolerated the procedure well and is transported to the cath lab recovery area in stable condition. There were no immediate intraoperative complications. All sponge instrument and needle counts are verified correct at completion of the operation.   No blood products were administered during the operation.  The patient received a total of 30 mL of intravenous contrast during the procedure.   Eugenio Hoes, MD 10/07/2022 5:53 PM

## 2022-10-07 NOTE — Interval H&P Note (Signed)
History and Physical Interval Note:  10/07/2022 10:52 AM  Barbara Lucero  has presented today for surgery, with the diagnosis of Severe Aortic Stenosis.  The various methods of treatment have been discussed with the patient and family. After consideration of risks, benefits and other options for treatment, the patient has consented to  Procedure(s): Transcatheter Aortic Valve Replacement, Transfemoral (Right) INTRAOPERATIVE TRANSTHORACIC ECHOCARDIOGRAM (N/A) as a surgical intervention.  The patient's history has been reviewed, patient examined, no change in status, stable for surgery.  I have reviewed the patient's chart and labs.  Questions were answered to the patient's satisfaction.     Eugenio Hoes

## 2022-10-07 NOTE — CV Procedure (Signed)
HEART AND VASCULAR CENTER  TAVR OPERATIVE NOTE   Date of Procedure:  10/07/2022  Preoperative Diagnosis: Severe Aortic Stenosis   Postoperative Diagnosis: Same   Procedure:   Transcatheter Aortic Valve Replacement - Transfemoral Approach  Edwards Sapien 3 THV (size 23 mm, model # [id number], serial # [id number] )   Co-Surgeons:  Verne Carrow, MD and Eugenio Hoes , MD   Anesthesiologist:  Hodierne  Echocardiographer:  Izora Ribas  Pre-operative Echo Findings: Severe aortic stenosis Normal left ventricular systolic function  Post-operative Echo Findings: Trivial paravalvular leak Normal left ventricular systolic function  BRIEF CLINICAL NOTE AND INDICATIONS FOR SURGERY  82 y.o. female with a hx of anxiety, depression, Crohn's disease, prior DVT, HTN, HLD and severe aortic stenosis.   During the course of the patient's preoperative work up they have been evaluated comprehensively by a multidisciplinary team of specialists coordinated through the Multidisciplinary Heart Valve Clinic in the [facility].  They have been demonstrated to suffer from symptomatic severe aortic stenosis as noted above. The patient has been counseled extensively as to the relative risks and benefits of all options for the treatment of severe aortic stenosis including long term medical therapy, conventional surgery for aortic valve replacement, and transcatheter aortic valve replacement.  The patient has been independently evaluated by Dr. Leafy Ro with CT surgery and they are felt to be at high risk for conventional surgical aortic valve replacement. The surgeon indicated the patient would be a poor candidate for conventional surgery. Based upon review of all of the patient's preoperative diagnostic tests they are felt to be candidate for transcatheter aortic valve replacement using the transfemoral approach as an alternative to high risk conventional surgery.    Following the  decision to proceed with transcatheter aortic valve replacement, a discussion has been held regarding what types of management strategies would be attempted intraoperatively in the event of life-threatening complications, including whether or not the patient would be considered a candidate for the use of cardiopulmonary bypass and/or conversion to open sternotomy for attempted surgical intervention.  The patient has been advised of a variety of complications that might develop peculiar to this approach including but not limited to risks of death, stroke, paravalvular leak, aortic dissection or other major vascular complications, aortic annulus rupture, device embolization, cardiac rupture or perforation, acute myocardial infarction, arrhythmia, heart block or bradycardia requiring permanent pacemaker placement, congestive heart failure, respiratory failure, renal failure, pneumonia, infection, other late complications related to structural valve deterioration or migration, or other complications that might ultimately cause a temporary or permanent loss of functional independence or other long term morbidity.  The patient provides full informed consent for the procedure as described and all questions were answered preoperatively.    DETAILS OF THE OPERATIVE PROCEDURE  PREPARATION:   The patient is brought to the operating room on the above mentioned date and central monitoring was established by the anesthesia team including placement of a radial arterial line. The patient is placed in the supine position on the operating table.  Intravenous antibiotics are administered. Conscious sedation is used.   Baseline transthoracic echocardiogram was performed. The patient's chest, abdomen, both groins, and both lower extremities are prepared and draped in a sterile manner. A time out procedure is performed.   PERIPHERAL ACCESS:   Using the modified Seldinger technique, femoral arterial and venous access were  obtained with placement of a 6 Fr sheath in the artery and a 7 Fr sheath in the vein on the left  side using u/s guidance.  A pigtail diagnostic catheter was passed through the femoral arterial sheath under fluoroscopic guidance into the aortic root.  A temporary transvenous pacemaker catheter was passed through the femoral venous sheath under fluoroscopic guidance into the right ventricle.  The pacemaker was tested to ensure stable lead placement and pacemaker capture. Aortic root angiography was performed in order to determine the optimal angiographic angle for valve deployment.  TRANSFEMORAL ACCESS:  A micropuncture kit was used to gain access to the right femoral artery using u/s guidance. Position confirmed with angiography. Pre-closure with double ProGlide closure devices. The patient was heparinized systemically and ACT verified > 250 seconds.    A 14 Fr transfemoral E-sheath was introduced into the right femoral artery after progressively dilating over an Amplatz superstiff wire. An AL-1 catheter was used to direct a straight-tip exchange length wire across the native aortic valve into the left ventricle. This was exchanged out for a pigtail catheter and position was confirmed in the LV apex. Simultaneous LV and Ao pressures were recorded.  The pigtail catheter was then exchanged for a Safari wire in the LV apex.   TRANSCATHETER HEART VALVE DEPLOYMENT:  An Edwards Sapien 3 THV (size 23 mm) was prepared and crimped per manufacturer's guidelines, and the proper orientation of the valve is confirmed on the [employer] delivery system. The valve was advanced through the introducer sheath using normal technique until in an appropriate position in the abdominal aorta beyond the sheath tip. The balloon was then retracted and using the fine-tuning wheel was centered on the valve. The valve was then advanced across the aortic arch using appropriate flexion of the catheter. The valve was carefully  positioned across the aortic valve annulus. The Commander catheter was retracted using normal technique. Once final position of the valve has been confirmed by angiographic assessment, the valve is deployed while temporarily holding ventilation and during rapid ventricular pacing to maintain systolic blood pressure < 50 mmHg and pulse pressure < 10 mmHg. The balloon inflation is held for >3 seconds after reaching full deployment volume. Once the balloon has fully deflated the balloon is retracted into the ascending aorta and valve function is assessed using TTE. There is felt to be trivial paravalvular leak and no central aortic insufficiency.  The patient's hemodynamic recovery following valve deployment is good.  The deployment balloon and guidewire are both removed. Echo demostrated acceptable post-procedural gradients, stable mitral valve function, and trivial AI.   PROCEDURE COMPLETION:  The sheath was then removed and closure devices were completed. Protamine was administered once femoral arterial repair was complete. The temporary pacemaker, pigtail catheters and femoral sheaths were removed with  a Mynx closure device placed in the artery and manual pressure used for venous hemostasis.    The patient tolerated the procedure well and is transported to the surgical intensive care in stable condition. There were no immediate intraoperative complications. All sponge instrument and needle counts are verified correct at completion of the operation.   No blood products were administered during the operation.  The patient received a total of 40 mL of intravenous contrast during the procedure.  LVEDP: 14 mm Hg  Verne Carrow MD, [facility] 10/07/2022 5:00 PM

## 2022-10-07 NOTE — Interval H&P Note (Signed)
History and Physical Interval Note:  10/07/2022 12:30 PM  Barbara Lucero  has presented today for surgery, with the diagnosis of Severe Aortic Stenosis.  The various methods of treatment have been discussed with the patient and family. After consideration of risks, benefits and other options for treatment, the patient has consented to  Procedure(s): Transcatheter Aortic Valve Replacement, Transfemoral (Right) INTRAOPERATIVE TRANSTHORACIC ECHOCARDIOGRAM (N/A) as a surgical intervention.  The patient's history has been reviewed, patient examined, no change in status, stable for surgery.  I have reviewed the patient's chart and labs.  Questions were answered to the patient's satisfaction.     Verne Carrow

## 2022-10-08 ENCOUNTER — Inpatient Hospital Stay (HOSPITAL_COMMUNITY): Payer: Medicare Other

## 2022-10-08 ENCOUNTER — Encounter (HOSPITAL_COMMUNITY): Payer: Self-pay | Admitting: Cardiovascular Disease

## 2022-10-08 DIAGNOSIS — Z952 Presence of prosthetic heart valve: Secondary | ICD-10-CM

## 2022-10-08 DIAGNOSIS — R0902 Hypoxemia: Secondary | ICD-10-CM | POA: Insufficient documentation

## 2022-10-08 NOTE — Anesthesia Postprocedure Evaluation (Signed)
Anesthesia Post Note  Patient: Barbara Lucero  Procedure(s) Performed: Transcatheter Aortic Valve Replacement, Transfemoral (Right) INTRAOPERATIVE TRANSTHORACIC ECHOCARDIOGRAM     Patient location during evaluation: Cath Lab Anesthesia Type: General Level of consciousness: awake and alert Pain management: pain level controlled Vital Signs Assessment: post-procedure vital signs reviewed and stable Respiratory status: spontaneous breathing, nonlabored ventilation, respiratory function stable and patient connected to nasal cannula oxygen Cardiovascular status: stable and blood pressure returned to baseline Postop Assessment: no apparent nausea or vomiting Anesthetic complications: no   There were no known notable events for this encounter.  Last Vitals:  Vitals:   10/08/22 1315 10/08/22 1350  BP:  (!) 97/45  Pulse: 70 65  Resp: (!) 22 18  Temp:    SpO2: 95% 96%    Last Pain:  Vitals:   10/08/22 1220  TempSrc: Oral  PainSc:                  , S

## 2022-10-08 NOTE — Progress Notes (Signed)
Pt's 02 sat on 2L at rest : 95% Pt's 02 sat on RA at rest:  85% Pt need 02 in order to maintained 02 sat >=90%  Lawson Radar, RN

## 2022-10-08 NOTE — Progress Notes (Signed)
Discharge instructions (including medications) discussed with and copy provided to patient/caregiver 

## 2022-10-08 NOTE — Progress Notes (Signed)
Echocardiogram 2D Echocardiogram has been performed.  Barbara Lucero 10/08/2022, 10:29 AM

## 2022-10-08 NOTE — Plan of Care (Signed)

## 2022-10-08 NOTE — TOC Transition Note (Signed)
Transition of Care (TOC) - CM/SW Discharge Note Donn Pierini RN, BSN Transitions of Care Unit 4E- RN Case Manager See Treatment Team for direct phone #   Patient Details  Name: Barbara Lucero MRN: [id number] Date of Birth: 1940-08-30  [facility] CM/SW Contact:  Darrold Span, RN Phone Number: 10/08/2022, 3:17 PM   Clinical Narrative:    Pt stable for transition home today, Order has been placed for home 02 and DME need for rollator (Cardiac Rehab recommendation)  Cm spoke with pt and daughter at bedside- pt voiced understanding about needing 02 for home. Discussed agency choice- pt states she does not have a preference- Call made to [employer] for home 02 and rollator need- [employer] to deliver portable 02 and rollator to the room prior to discharge- will follow up on home delivery post discharge.   Address, phone and PCP confirmed   Daughter to transport home once DME delivered.   No further TOC needs noted    Final next level of care: Home/Self Care Barriers to Discharge: No Barriers Identified   Patient Goals and CMS Choice CMS Medicare.gov Compare Post Acute Care list provided to:: Patient Choice offered to / list presented to : Patient  Discharge Placement                 Home        Discharge Plan and Services Additional resources added to the After Visit Summary for     Discharge Planning Services: CM Consult Post Acute Care Choice: Durable Medical Equipment          DME Arranged: Oxygen, Walker rolling with seat DME Agency: [facility] Date DME Agency Contacted: 10/08/22 Time DME Agency Contacted: [phone] Representative spoke with at DME Agency: Zollie Beckers   [facility] Agency: NA        Social Determinants of Health (SDOH) Interventions SDOH Screenings   Food Insecurity: No Food Insecurity (10/07/2022)  Housing: Low Risk  (10/07/2022)  Transportation Needs: No Transportation Needs (10/07/2022)  Utilities: Not At Risk (10/07/2022)  Tobacco Use:  Medium Risk (10/07/2022)     Readmission Risk Interventions    10/08/2022    3:17 PM  Readmission Risk Prevention Plan  Post Dischage Appt Complete  Medication Screening Complete  Transportation Screening Complete

## 2022-10-08 NOTE — Plan of Care (Signed)
  Problem: Education: Goal: Knowledge of General Education information will improve Description: Including pain rating scale, medication(s)/side effects and non-pharmacologic comfort measures Outcome: Progressing   Problem: Clinical Measurements: Goal: Ability to maintain clinical measurements within normal limits will improve Outcome: Progressing Goal: Cardiovascular complication will be avoided Outcome: Progressing   Problem: Activity: Goal: Risk for activity intolerance will decrease Outcome: Progressing   Problem: Pain Managment: Goal: General experience of comfort will improve Outcome: Progressing   Problem: Education: Goal: Knowledge of General Education information will improve Description: Including pain rating scale, medication(s)/side effects and non-pharmacologic comfort measures Outcome: Progressing   Problem: Clinical Measurements: Goal: Ability to maintain clinical measurements within normal limits will improve Outcome: Progressing Goal: Cardiovascular complication will be avoided Outcome: Progressing   Problem: Activity: Goal: Risk for activity intolerance will decrease Outcome: Progressing   Problem: Pain Managment: Goal: General experience of comfort will improve Outcome: Progressing

## 2022-10-08 NOTE — Progress Notes (Signed)
CARDIAC REHAB PHASE I    Multiple attempts made to see pt for education and ambulation. Pt either refused related to eating and fatigue. Also had echo completed this am. Will return after lunch to assist with mobility and education.     Woodroe Chen, RN BSN 10/08/2022 1100AM

## 2022-10-08 NOTE — Progress Notes (Signed)
CARDIAC REHAB PHASE I   PRE:  Rate/Rhythm: 58 SB   BP:  Sitting: 108/51      SaO2: 96 2L/[place]  MODE:  Ambulation: 25 ft   POST:  Rate/Rhythm: 65 SR   BP:  Sitting: 86/43 recheck  97/45                              SaO2: 86 -92 RA                                        94-96 2L/[place]  Pt resting in bed, feeling tired and sleepy.  Agreeable to walk. Pt placed on RA for oob and mobility, to assess need for supplemental O2 requirements. Pt able to sit on side of bed with moderate assistance. Pt ambulated in room and to chair using front wheel walker. Pt complained of  heartburn, fatigue and feeling sleepy. Pt denied SOB or dizziness. Pt in chair with call bell and bedside table in reach. Pt denied crushing CP stated it feels like my usual heart burn. B/P dropped, noted above, pt remained asymptomatic during walk and B/P improved after few minutes. Linh RN and Georgie Chard NP notified of B/P and heartburn complaint. Post TAVR education including site care, restrictions, exercise guidelines, heart healthy diet and CRP2 reviewed. All questions and concerns addressed. Will refer to [facility] for CRP2. Will continue to follow.    [id number] Woodroe Chen, RN BSN 10/08/2022 2:31 PM

## 2022-10-09 ENCOUNTER — Telehealth: Payer: Self-pay | Admitting: Cardiology

## 2022-10-09 ENCOUNTER — Ambulatory Visit: Payer: Medicare Other | Admitting: Emergency Medicine

## 2022-10-09 NOTE — Telephone Encounter (Signed)
  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 Reports that she is feeling well with no issues overnight.   Patient understands to follow up with provider Georgie Chard NP-C on 8/26 at 840am.  Patient understands discharge instructions? Yes  Patient understands medications and regimen? Yes  Patient understands to bring all medications to this visit? Yes   Georgie Chard NP-C Structural Heart Team  Pager: [phone]

## 2022-10-10 ENCOUNTER — Other Ambulatory Visit: Payer: Self-pay | Admitting: Obstetrics & Gynecology

## 2022-10-10 ENCOUNTER — Telehealth: Payer: Self-pay | Admitting: Cardiovascular Disease

## 2022-10-10 NOTE — Telephone Encounter (Signed)
Will forward to Dr Clifton James for review  May need certain amount of time after TAVR before any therapy can be done  Pt has f/u on 8/26/at 8:40

## 2022-10-10 NOTE — Telephone Encounter (Signed)
New Message:     Patient's daughter called. She said the patient had the TAVR procedure on Wednesday. Patient feels like she needs  physical therapy. She can not get around good. Her right knee is bothering her a lot. Patient would like for Dr Clifton James to please order Physical Therapy for her.

## 2022-10-10 NOTE — Telephone Encounter (Signed)
Lm to call back ./cy 

## 2022-10-14 ENCOUNTER — Telehealth: Payer: Self-pay | Admitting: Cardiovascular Disease

## 2022-10-14 NOTE — Telephone Encounter (Signed)
I spoke with the patient and she tested positive for Covid today. The pt states that she started developing symptoms of a cold on Sunday. She complains of chills, runny nose and congestion.  The pt denies fever. The patient is scheduled for a virtual visit with her PCP tomorrow and I advised her to keep this appointment and they will evaluate her and prescribe treatment for covid if indicated.  Pt agreed with plan.

## 2022-10-14 NOTE — Telephone Encounter (Signed)
Patient stated she had heart surgery last week and wanted to let Dr. Clifton James know she has now tested positive for COVID and wants to know if there is any medication she can take.

## 2022-10-15 DIAGNOSIS — R067 Sneezing: Secondary | ICD-10-CM | POA: Diagnosis not present

## 2022-10-15 DIAGNOSIS — U071 COVID-19: Secondary | ICD-10-CM | POA: Diagnosis not present

## 2022-10-15 DIAGNOSIS — R059 Cough, unspecified: Secondary | ICD-10-CM | POA: Diagnosis not present

## 2022-10-16 NOTE — Progress Notes (Deleted)
[facility]   MULTIDISCIPLINARY HEART VALVE CLINIC                                     Cardiology Office Note:    Date:  10/16/2022   ID:  KOSISOCHUKWU SANDFORD, DOB 11-03-1940, MRN [id number]  PCP:  [name], MD  [facility] Cardiologist:  Charlton Haws, MD  / Dr. Eden Emms, MD & Dr. Laneta Simmers, MD (TAVR)  [facility] Electrophysiologist:  None   Referring MD: [name], MD   No chief complaint on file. ***  History of Present Illness:    GHADA BLANCHE is a 82 y.o. female with a hx of anxiety, depression, Crohn's disease, prior DVT, HTN, HLD and severe aortic stenosis who presented today for planned TAVR.    Ms. Moorman had an echocardiogram 04/2022 that showed LVEF 60-65% and severe aortic stenosis with calcified, thickened leaflets, mean gradient 45.7 mmHg, peak gradient 78.5 mmHg, AVA 0.64 cm2, DI 0.20. She was referred to Dr. Pearletha Alfred 06/23/22 to be considered for TAVR. R/LHC showed nonobstructive CAD. Pre TAVR CTs showed anatomy amendable for a 23 mm Edwards S3UR via the right TF approach. It also showed an enlarging pulmonary nodule highly suspicious for malignancy. She was evaluated by Dr. Delton Coombes and underwent navigational bronchoscopy on 09/01/22 with cytology showing  atypical cells present but not diagnostic of malignancy. Dr. Delton Coombes reviewed this with tumor board and it was felt best to send her for a PET scan and refer to the cancer center. This was performed 09/26/2022 which confirmed a 1.8 x 1.5 cm solid nodule in the posterior right upper lobe with maximum SUV of 12.8 with no hyper metabolic thoracic adenopathy and low evidence for distant metastatic disease. She was seen by Dr. Shirline Frees with a plan to pursue a radiation oncology consult for SBRT to the RUL nodule as she was not felt to be a surgical candidate.   As far as her valve is concerned, she was evaluated by the multidisciplinary valve team and felt to have severe, symptomatic aortic stenosis and to be a suitable  candidate and is now s/p successful TAVR with a 23 mm Edwards Sapien 3 THV via the TF  approach on 10/07/22. Post operative echo with normal LV function, mean gradient at , peak , AVA by VTI at 1.59cm2, and DI of 0.56. She was restarted on ASA 81mg  monotherapy post procedure. She was discharged on supplemental oxygen as she remained hypoxic with ambulation felt certainly secondary to lung CA.    She called our team 8/20 to inform us of a positive COVID result.   Today she is here      Severe AS: s/p successful TAVR with a 23 mm Edwards Sapien 3 THV via the TF  approach on 10/07/22. Post operative echo today with normal LV function, mean gradient at , peak , AVA by VTI at 1.59cm2, and DI of 0.56.  Groin sites are stable. Patient restarted on ASA 81mg  monotherapy post procedure. Discharge instructions reviewed with understanding. CRI attempted several times today to ambulate the patient with refusal however was able to ambulate late afternoon. See plan for supplemental oxygen below. She will require lifelong dental SBE and should refrain from dental procedures or cleanings for the next 30 days. SBE to be further discussed and RX'ed at follow up. Plan close [facility] office visit next week and one month with  repeat echocardiogram thereafter.    Hypoxia: Patient has required supplemental oxygen of 2L overnight and into today. Daughter at bedside who reports this has been a chronic issue secondary to recent finding of bronchogenic carcinoma. Will plan for home oxygen 2L.    HTN: Continue current dose of irbesartan and follow closely.    HLD: Continue statin    Previous DVT: No longer on OAC.    Pulmonary nodule: Pre TAVR CT imaging with suspicious lung nodule. Underwent navigational bronch 09/01/22 with results suspicious for slow growing malignancy. 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 Not felt to be a good resection candidate therefore  plan for radiation oncology referral per Dr. Arbutus Ped for consideration of SBRT.                Severe AS: s/p successful TAVR with a 23 mm Edwards Sapien 3 THV via the TF  approach on 10/07/22. Post operative echo today with normal LV function, mean gradient at , peak , AVA by VTI at 1.59cm2, and DI of 0.56.  Groin sites are stable. Patient restarted on ASA 81mg  monotherapy post procedure. Discharge instructions reviewed with understanding. CRI attempted several times today to ambulate the patient with refusal however was able to ambulate late afternoon. See plan for supplemental oxygen below. She will require lifelong dental SBE and should refrain from dental procedures or cleanings for the next 30 days. SBE to be further discussed and RX'ed at follow up. Plan close [facility] office visit next week and one month with repeat echocardiogram thereafter.    Hypoxia: Patient has required supplemental oxygen of 2L overnight and into today. Daughter at bedside who reports this has been a chronic issue secondary to recent finding of bronchogenic carcinoma. Will plan for home oxygen 2L.    HTN: Continue current dose of irbesartan and follow closely.    HLD: Continue statin    Previous DVT: No longer on OAC.    Pulmonary nodule: Pre TAVR CT imaging with suspicious lung nodule. Underwent navigational bronch 09/01/22 with results suspicious for slow growing malignancy. 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 Not felt to be a good resection candidate therefore plan for radiation oncology referral per Dr. Arbutus Ped for consideration of SBRT.           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 Clifton James & Dr. Leafy Ro   Severe aortic stenosis    Varicose veins of bilateral lower extremities with other complications     Past Surgical History:  Procedure Laterality Date   BOWEL RESECTION  1970   BRONCHIAL BIOPSY  09/01/2022   Procedure: BRONCHIAL BIOPSIES;  Surgeon: Leslye Peer, MD;  Location: [facility] ENDOSCOPY;  Service: Pulmonary;;   BRONCHIAL BRUSHINGS  09/01/2022   Procedure: BRONCHIAL BRUSHINGS;  Surgeon: Leslye Peer, MD;  Location: [facility] ENDOSCOPY;  Service: Pulmonary;;   BRONCHIAL NEEDLE ASPIRATION BIOPSY  09/01/2022   Procedure: BRONCHIAL NEEDLE ASPIRATION BIOPSIES;  Surgeon: Leslye Peer,  MD;  Location: [facility] ENDOSCOPY;  Service: Pulmonary;;   EYE SURGERY Bilateral    cataract   FIDUCIAL MARKER PLACEMENT  09/01/2022   Procedure: FIDUCIAL MARKER PLACEMENT;  Surgeon: Leslye Peer, MD;  Location: [facility] ENDOSCOPY;  Service: Pulmonary;;   HIP ARTHROPLASTY Left 06/06/2016   Procedure: ARTHROPLASTY BIPOLAR HIP (HEMIARTHROPLASTY);  Surgeon: Vickki Hearing, MD;  Location: [facility] ORS;  Service: Orthopedics;  Laterality: Left;   INTRAOPERATIVE TRANSTHORACIC ECHOCARDIOGRAM N/A 10/07/2022   Procedure: INTRAOPERATIVE TRANSTHORACIC ECHOCARDIOGRAM;  Surgeon: Kathleene Hazel, MD;  Location: [facility] INVASIVE CV LAB;  Service: Open Heart Surgery;  Laterality: N/A;   RIGHT HEART CATH AND CORONARY ANGIOGRAPHY N/A 07/07/2022   Procedure: RIGHT HEART CATH AND CORONARY ANGIOGRAPHY;  Surgeon: Kathleene Hazel, MD;  Location: [facility] INVASIVE CV LAB;  Service: Cardiovascular;  Laterality: N/A;   TRANSCATHETER AORTIC VALVE REPLACEMENT, TRANSFEMORAL Right 10/07/2022   Procedure: Transcatheter Aortic Valve Replacement, Transfemoral;  Surgeon: Kathleene Hazel, MD;  Location: [facility] INVASIVE CV LAB;  Service: Open Heart Surgery;  Laterality: Right;    Current  Medications: No outpatient medications have been marked as taking for the 10/20/22 encounter (Appointment) with [facility].     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  Tobacco Use   Smoking status: Former    Current packs/day: 0.50    Average packs/day: 0.5 packs/day for 20.0 years (10.0 ttl pk-yrs)    Types: Cigarettes   Smokeless tobacco: Never  Vaping Use   Vaping status: Never Used  Substance and Sexual Activity   Alcohol use: No   Drug use: No   Sexual activity: Not Currently    Birth control/protection: Post-menopausal  Other Topics Concern   Not on file  Social History Narrative   Not on file   Social Determinants of Health   Financial Resource Strain: Not on file  Food Insecurity: No Food Insecurity (10/07/2022)   Hunger Vital Sign    Worried About Running Out of Food in the Last Year: Never true    Ran Out of Food in the Last Year: Never true  Transportation Needs: No Transportation Needs (10/07/2022)   PRAPARE - Administrator, Civil Service (Medical): No    Lack of Transportation (Non-Medical): No  Physical Activity: Not on file  Stress: Not on file  Social Connections: Not on file     Family History: The patient's ***family history includes Bipolar disorder in her daughter; Breast cancer in her sister and sister; Heart attack in her father; Other in her son; Stroke in her mother.  ROS:   Please see the history of present illness.    All other systems reviewed and are negative.  EKGs/Labs/Other Studies Reviewed:    The following studies were reviewed today:  Cardiac Studies & Procedures   CARDIAC CATHETERIZATION  CARDIAC CATHETERIZATION  07/07/2022  Narrative   Mid Cx lesion is 30% stenosed.   Ost LAD to Prox LAD lesion is 20% stenosed.   Mid LAD lesion is 30% stenosed.   Prox RCA lesion is 50% stenosed.  Mild non-obstructive CAD Mild elevation right heart pressures PCWP 10 mmHg Mean PA pressure 33 Severe  aortic stenosis by echo (I did not cross the aortic valve today).  Recommendations: Will continue with workup for TAVR.  Findings Coronary Findings Diagnostic  Dominance: Co-dominant  Left Anterior Descending Vessel is large. Ost LAD to Prox LAD lesion is 20% stenosed. Mid LAD lesion is 30% stenosed.  Left Circumflex Vessel is large. Mid Cx lesion is 30% stenosed.  Left Posterior Atrioventricular Artery Vessel is moderate in size.  Right Coronary Artery Vessel is small. Prox RCA lesion is 50% stenosed.  Intervention  No interventions have been documented.     ECHOCARDIOGRAM  ECHOCARDIOGRAM COMPLETE 10/08/2022  Narrative ECHOCARDIOGRAM REPORT    Patient Name:   MARNEISHA MILLIEN Date of Exam: 10/08/2022 Medical Rec #:  [id number]     Height:       61.0 in Accession #:    [id number]    Weight:       173.3 lb Date of Birth:  06-28-1940      BSA:          1.777 m Patient Age:    82 years      BP:           97/53 mmHg Patient Gender: F             HR:           73 bpm. Exam Location:  Inpatient  Procedure: 2D Echo, Cardiac Doppler and Color Doppler  Indications:    Post TAVR evaluation V43.3/Z95.2  History:        Patient has prior history of Echocardiogram examinations, most recent 10/07/2022. Signs/Symptoms:Hypotension; Risk Factors:Hypertension and Dyslipidemia. Aortic Valve: 23 mm Edwards Sapien prosthetic, stented (TAVR) valve is present in the aortic position. Procedure Date: 10/07/22.  Sonographer:    Lucendia Herrlich Referring Phys: [id number] KATHRYN R THOMPSON  IMPRESSIONS   1. Compared to 10/07/22 mean gradient across TAVR valve is similar (14 mmHg) with DI 0.56; AI not evident on  present study. 2. Left ventricular ejection fraction, by estimation, is 70 to 75%. The left ventricle has hyperdynamic function. The left ventricle has no regional wall motion abnormalities. The left ventricular internal cavity size was mildly dilated. There is mild concentric left ventricular hypertrophy. Left ventricular diastolic parameters are consistent with Grade I diastolic dysfunction (impaired relaxation). Elevated left atrial pressure. 3. Right ventricular systolic function is normal. The right ventricular size is normal. 4. Left atrial size was mildly dilated. 5. The mitral valve is normal in structure. No evidence of mitral valve regurgitation. No evidence of mitral stenosis. Severe mitral annular calcification. 6. The aortic valve has been repaired/replaced. Aortic valve regurgitation is not visualized. No aortic stenosis is present. There is a 23 mm Edwards Sapien prosthetic (TAVR) valve present in the aortic position. Procedure Date: 10/07/22. 7. The inferior vena cava is normal in size with greater than 50% respiratory variability, suggesting right atrial pressure of 3 mmHg.  FINDINGS Left Ventricle: Left ventricular ejection fraction, by estimation, is 70 to 75%. The left ventricle has hyperdynamic function. The left ventricle has no regional wall motion abnormalities. The left ventricular internal cavity size was mildly dilated. There is mild concentric left ventricular hypertrophy. Left ventricular diastolic parameters are consistent with Grade I diastolic dysfunction (impaired relaxation). Elevated left atrial pressure.  Right Ventricle: The right ventricular size is normal. Right ventricular systolic function is normal.  Left Atrium: Left atrial size was mildly dilated.  Right Atrium: Right atrial size was normal in size.  Pericardium: There is no evidence of pericardial  effusion.  Mitral Valve: The mitral valve is normal in structure. Severe mitral annular calcification. No  evidence of mitral valve regurgitation. No evidence of mitral valve stenosis.  Tricuspid Valve: The tricuspid valve is normal in structure. Tricuspid valve regurgitation is trivial. No evidence of tricuspid stenosis.  Aortic Valve: The aortic valve has been repaired/replaced. Aortic valve regurgitation is not visualized. No aortic stenosis is present. Aortic valve mean gradient measures 14.0 mmHg. Aortic valve peak gradient measures 28.0 mmHg. Aortic valve area, by VTI measures 1.59 cm. There is a 23 mm Edwards Sapien prosthetic, stented (TAVR) valve present in the aortic position. Procedure Date: 10/07/22.  Pulmonic Valve: The pulmonic valve was normal in structure. Pulmonic valve regurgitation is not visualized. No evidence of pulmonic stenosis.  Aorta: The aortic root is normal in size and structure.  Venous: The inferior vena cava is normal in size with greater than 50% respiratory variability, suggesting right atrial pressure of 3 mmHg.  IAS/Shunts: No atrial level shunt detected by color flow Doppler.  Additional Comments: Compared to 10/07/22 mean gradient across TAVR valve is similar (14 mmHg) with DI 0.56; AI not evident on present study.   LEFT VENTRICLE PLAX 2D LVIDd:         4.85 cm   Diastology LVIDs:         3.00 cm   LV e' medial:    5.00 cm/s LV PW:         1.05 cm   LV E/e' medial:  17.2 LV IVS:        1.20 cm   LV e' lateral:   7.94 cm/s LVOT diam:     1.90 cm   LV E/e' lateral: 10.8 LV SV:         81 LV SV Index:   46 LVOT Area:     2.84 cm   RIGHT VENTRICLE             IVC RV S prime:     13.50 cm/s  IVC diam: 1.50 cm TAPSE (M-mode): 1.9 cm  LEFT ATRIUM             Index        RIGHT ATRIUM           Index LA diam:        4.15 cm 2.34 cm/m   RA Area:     13.40 cm LA Vol (A2C):   59.3 ml 33.37 ml/m  RA Volume:   30.55 ml  17.19 ml/m LA Vol (A4C):   63.7 ml 35.85 ml/m LA Biplane Vol: 61.4 ml 34.55 ml/m AORTIC VALVE AV Area (Vmax):    1.62 cm AV Area  (Vmean):   1.63 cm AV Area (VTI):     1.59 cm AV Vmax:           264.67 cm/s AV Vmean:          169.667 cm/s AV VTI:            0.511 m AV Peak Grad:      28.0 mmHg AV Mean Grad:      14.0 mmHg LVOT Vmax:         151.00 cm/s LVOT Vmean:        97.600 cm/s LVOT VTI:          0.287 m LVOT/AV VTI ratio: 0.56  AORTA Ao Root diam: 3.10 cm Ao Asc diam:  3.00 cm  MITRAL VALVE  TRICUSPID VALVE MV Area (PHT): 2.76 cm     TR Peak grad:   23.2 mmHg MV Decel Time: 275 msec     TR Vmax:        241.00 cm/s MR Peak grad: 18.3 mmHg MR Vmax:      214.00 cm/s   SHUNTS MV E velocity: 85.90 cm/s   Systemic VTI:  0.29 m MV A velocity: 131.00 cm/s  Systemic Diam: 1.90 cm MV E/A ratio:  0.66  Olga Millers MD Electronically signed by Olga Millers MD Signature Date/Time: 10/08/2022/10:59:09 AM    Final     CT SCANS  CT CORONARY MORPH W/CTA COR W/SCORE 07/10/2022  Addendum 07/11/2022  8:06 AM ADDENDUM REPORT: 07/11/2022 08:04  ADDENDUM: Please see separate dictation for contemporaneously obtained CTA chest, abdomen and pelvis dated 07/10/2022 for full description of relevant extracardiac findings.   Electronically Signed By: Trudie Reed M.D. On: 07/11/2022 08:04  Narrative CLINICAL DATA:  Aortic Stenosis  EXAM: Cardiac TAVR CT  TECHNIQUE: The patient was scanned on a Siemens Force 192 slice scanner. A 120 kV retrospective scan was triggered in the ascending thoracic aorta at 140 HU's. Gantry rotation speed was 250 msecs and collimation was .6 mm. No beta blockade or nitro were given. The 3D data set was reconstructed in 5% intervals of the R-R cycle. Systolic and diastolic phases were analyzed on a dedicated work station using MPR, MIP and VRT modes. The patient received 80 cc of contrast.  FINDINGS: Aortic Valve: Tri leaflet calcified with restricted leaflet motion Calcium score 1272  Aorta: No aneurysm Bovine arch moderate calcific  atherosclerosis  Sino-tubular Junction: 24 mm  Ascending Thoracic Aorta: 29 mm  Aortic Arch: 24 mm  Descending Thoracic Aorta: 26 mm  Sinus of Valsalva Measurements:  Non-coronary: 26 mm   height 17.2 mm  Right - coronary: 25 mm  height 16.8  Left -   coronary: 25.7 mm  height 18.7 mm  Coronary Artery Height above Annulus:  Left Main: 14,4 mm above annulus  Right Coronary: 14.3 mm above annulus  Virtual Basal Annulus Measurements:  Maximum / Minimum Diameter: 21.1 mm x 20.2 mm Average diameter 21.4 mm  Perimeter: 68.1 mm  Area: 361 mm2  Coronary Arteries: Sufficient height above annulus for deployment  Optimum Fluoroscopic Angle for Delivery: LAO 3 Caudal 3 degrees  Membranous septal length 7.7 mm  IMPRESSION: 1. Tri leaflet calcified AV with restricted leaflet motion Calcium Score 1272  2. Annular area of 361 mm2 suitable for a 23 mm Sapien 3 valve Sinuses are small for a 26 mm Medtronic Evolut  3.  Optimal angiographic angle for deployment LAO 3 Caudal 3 degrees  4.  Coronary arteries sufficient height above annulus for deployment  5.  Membranous septal length 7.7 mm  6.  Dilated right main pulmonary artery 2.7 cm  Charlton Haws  Electronically Signed: By: Charlton Haws M.D. On: 07/10/2022 17:02          EKG:  EKG is *** ordered today.  The ekg ordered today demonstrates ***  Recent Labs: 11/23/2021: TSH 1.557 10/03/2022: ALT 12 10/08/2022: BUN 12; Creatinine, Ser 0.87; Hemoglobin 10.3; Magnesium 1.9; Platelets 115; Potassium 3.7; Sodium 136   Recent Lipid Panel No results found for: "CHOL", "TRIG", "HDL", "CHOLHDL", "VLDL", "LDLCALC", "LDLDIRECT"   Risk Assessment/Calculations:   {Does this patient have ATRIAL FIBRILLATION?:[phone]}    {This patient may be at risk for Amyloid.  Click HERE to open Cardiac Amyloid Screening SmartSet to order  screening OR Click HERE to defer testing for 1 year or permanently :1}    Physical Exam:    VS:   There were no vitals taken for this visit.    Wt Readings from Last 3 Encounters:  10/08/22 173 lb 4.5 oz (78.6 kg)  10/02/22 175 lb (79.4 kg)  09/17/22 174 lb 9.6 oz (79.2 kg)     GEN: *** Well nourished, well developed in no acute distress HEENT: Normal NECK: No JVD; No carotid bruits LYMPHATICS: No lymphadenopathy CARDIAC: ***RRR, no murmurs, rubs, gallops RESPIRATORY:  Clear to auscultation without rales, wheezing or rhonchi  ABDOMEN: Soft, non-tender, non-distended MUSCULOSKELETAL:  No edema; No deformity  SKIN: Warm and dry NEUROLOGIC:  Alert and oriented x 3 PSYCHIATRIC:  Normal affect   ASSESSMENT:      PLAN:    In order of problems listed above:       {Are you ordering a CV Procedure (e.g. stress test, cath, DCCV, TEE, etc)?   Press F2        :[id number]}    Medication Adjustments/Labs and Tests Ordered: Current medicines are reviewed at length with the patient today.  Concerns regarding medicines are outlined above.  No orders of the defined types were placed in this encounter.  No orders of the defined types were placed in this encounter.   There are no Patient Instructions on file for this visit.   Signed, Georgie Chard, NP  10/16/2022 9:26 AM    [facility]

## 2022-10-17 ENCOUNTER — Ambulatory Visit: Payer: Medicare Other

## 2022-10-20 ENCOUNTER — Telehealth: Payer: Self-pay | Admitting: Cardiology

## 2022-10-20 ENCOUNTER — Ambulatory Visit: Payer: Medicare Other | Attending: Internal Medicine

## 2022-10-20 NOTE — Telephone Encounter (Signed)
Spoke to pt who is recovering from Covid. She had many time restrictions to reschedule. By the time we could find a time that work, it was close to her 1 month apt in September. We will just keep her next visit as scheduled and she will call us if she needs to be seen sooner.

## 2022-10-20 NOTE — Telephone Encounter (Signed)
Patient scheduled for [facility] s/p TAVR today at 0840. Called patient to follow up on no show with no answer. Left voice message to return call for appointment rescheduling.   Georgie Chard NP-C Structural Heart Team  Pager: [phone] Phone: [phone]

## 2022-10-21 ENCOUNTER — Encounter: Payer: Self-pay | Admitting: Cardiology

## 2022-10-23 ENCOUNTER — Telehealth: Payer: Self-pay | Admitting: Cardiovascular Disease

## 2022-10-23 NOTE — Telephone Encounter (Signed)
Called spoke with pt and daughter.  Reports O2 level has been 92% and above for the past week.  Was previously using O2 at bedtime. Has not used in 3-4 days.  Daughter reports pt is being charged wants order stopped.  Advised will send to provider to ensure it's okay to stop order.

## 2022-10-23 NOTE — Telephone Encounter (Signed)
Patient's daughter would like to cancel O2 order because it has been ranginmg 92-96. She states the company, [facility], can be contacted at [phone].

## 2022-10-24 ENCOUNTER — Other Ambulatory Visit: Payer: Self-pay | Admitting: Cardiovascular Disease

## 2022-10-24 NOTE — Telephone Encounter (Signed)
Spoke with Lurena Joiner at [facility] Informed her provided has Ok'ed discontinuing oxygen for patient.  Per Lurena Joiner, will need Rx to discontinue oxygen faxed to [phone].

## 2022-10-29 ENCOUNTER — Encounter: Payer: Self-pay | Admitting: Cardiology

## 2022-10-29 NOTE — Telephone Encounter (Signed)
Faxed request to discontinue oxygen today.                  -------Fax Transmission Report-------  To:               Recipient at [phone] Subject:          Fw: Send data from [id number] 10/29/2022 15:07 Result:           The transmission was successful. Explanation:      All Pages Ok Pages Sent:       3 Connect Time:     1 minutes, 43 seconds Transmit Time:    10/29/2022 14:41 Transfer Rate:    14400 Status Code:      0000 Retry Count:      0 Job Id:           [id number] Unique Id:        [id number] Fax Line:         52 Fax Server:       [id number]

## 2022-11-05 NOTE — Progress Notes (Signed)
[facility]   MULTIDISCIPLINARY HEART VALVE TEAM  Structural Heart Office Note:  .   Date:  11/11/2022  ID:  Barbara Lucero, DOB 10/21/1940, MRN [id number] PCP: [name], MD  [facility] Providers Cardiologist:  Charlton Haws, MD  Dr. Clifton James, MD/Dr. Leafy Ro, MD (TAVR)  History of Present Illness: .    Barbara Lucero is a 82 y.o. female with a history of anxiety, depression, Crohn's disease, prior DVT, HTN, HLD and severe aortic stenosis s/p TAVR 10/07/22 and is here today for one month follow up.   Barbara Lucero had an echocardiogram 04/2022 that showed LVEF 60-65% and severe aortic stenosis with calcified, thickened leaflets, mean gradient 45.7 mmHg, peak gradient 78.5 mmHg, AVA 0.64 cm2, DI 0.20. She was referred to Dr. Pearletha Alfred 06/23/22 to be considered for TAVR. R/LHC showed nonobstructive CAD. Pre TAVR CTs showed anatomy amendable for a 23 mm Edwards S3UR via the right TF approach. It also showed an enlarging pulmonary nodule highly suspicious for malignancy. She was evaluated by Dr. Delton Coombes and underwent navigational bronchoscopy on 09/01/22 with results suspicious for slow growing malignancy. 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 Not felt to be a good resection candidate therefore plan for radiation oncology referral per Dr. Arbutus Ped for consideration of SBRT after TAVR completed.   She underwent successful TAVR with a 23 mm Edwards Sapien 3 THV via the TF approach on 10/07/22. Post operative echo with normal LV function, mean gradient at , peak , AVA by VTI at 1.59cm2, and DI of 0.56.  She was restarted on ASA 81mg  monotherapy. She had some difficulty with hypoxia and was discharged on supplemental oxygen.  She was initially scheduled for TOC however developed COVID and was evaluated over the phone. Today she is here with her daughter and reports that she has been well since discharge from a CV standpoint. She  continues to be a little fatigued as she is recovering from COVID. She is going to [facility] this afternoon and has been very excited about this. No longer requiring home oxygen and saturations have been very stable. She denies chest pain, SOB, palpitations, LE edema, orthopnea, PND, dizziness, or syncope. Denies bleeding in stool or urine.       Physical Exam:    VS:  BP 124/72   Pulse 70   Ht 5\' 3"  (1.6 m)   Wt 174 lb 9.6 oz (79.2 kg)   SpO2 96%   BMI 30.93 kg/m    Wt Readings from Last 3 Encounters:  11/10/22 176 lb 9.4 oz (80.1 kg)  11/10/22 174 lb 9.6 oz (79.2 kg)  10/08/22 173 lb 4.5 oz (78.6 kg)    General: Well developed, well nourished, NAD Skin: Warm, dry, intact  Cardiovascular: RRR with S1 S2. No murmurs Extremities: No edema.  Neuro: Alert and oriented. No focal deficits. No facial asymmetry. MAE spontaneously. Psych: Responds to questions appropriately with normal affect.    ASSESSMENT AND PLAN: .    Severe AS: Patient doing well with NYHA class I symptoms s/p successful TAVR with a 23 mm Edwards Sapien 3 10/07/22. Echo today with hyperdynamic EF with increased gradient on echo today at when compared to post op echo ( ), peak 48.12mmHg, and AVA by VTI at 1.35cm2 with moderate AR. Restarted on home ASA 81mg  daily. Will have Dr. Clifton James review echo given increased gradients. No SBE needed as she has full upper and lower dentures. Otherwise, plan  regular follow up with Dr. Eden Emms 12/204 and our team with repeat echo at one year. Could consider repeating echo in December if she becomes symptomatic.    Hypoxia: Improved today and no longer requiring supplemental O2.    HTN: WNL today with no changes needed at this time.   HLD: Continue statin    Previous DVT: No longer on OAC.    Pulmonary nodule: Pre TAVR CT imaging with suspicious lung nodule. Underwent navigational bronch 09/01/22 with results suspicious for slow growing malignancy. 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 Not felt to be a good resection candidate therefore plan for radiation oncology referral per Dr. Arbutus Ped for consideration of SBRT.   Signed, Georgie Chard, NP

## 2022-11-07 ENCOUNTER — Encounter (HOSPITAL_COMMUNITY)
Admission: RE | Admit: 2022-11-07 | Discharge: 2022-11-07 | Disposition: A | Discharge status: Home / Self Care | Payer: Medicare Other | Source: Ambulatory Visit | Attending: Cardiovascular Disease | Admitting: Cardiovascular Disease

## 2022-11-07 DIAGNOSIS — Z952 Presence of prosthetic heart valve: Secondary | ICD-10-CM | POA: Insufficient documentation

## 2022-11-10 ENCOUNTER — Ambulatory Visit (HOSPITAL_COMMUNITY): Payer: Medicare Other | Attending: Cardiology | Admitting: Cardiology

## 2022-11-10 ENCOUNTER — Other Ambulatory Visit: Payer: Self-pay | Admitting: Cardiology

## 2022-11-10 ENCOUNTER — Ambulatory Visit (HOSPITAL_BASED_OUTPATIENT_CLINIC_OR_DEPARTMENT_OTHER): Payer: Medicare Other

## 2022-11-10 ENCOUNTER — Encounter (HOSPITAL_COMMUNITY)
Admission: RE | Admit: 2022-11-10 | Discharge: 2022-11-10 | Disposition: A | Discharge status: Home / Self Care | Payer: Medicare Other | Source: Ambulatory Visit | Attending: Cardiovascular Disease | Admitting: Cardiovascular Disease

## 2022-11-10 VITALS — BP 124/72 | HR 70 | Ht 63.0 in | Wt 174.6 lb

## 2022-11-10 VITALS — Ht 61.0 in | Wt 176.6 lb

## 2022-11-10 DIAGNOSIS — R911 Solitary pulmonary nodule: Secondary | ICD-10-CM | POA: Diagnosis not present

## 2022-11-10 DIAGNOSIS — Z952 Presence of prosthetic heart valve: Secondary | ICD-10-CM

## 2022-11-10 DIAGNOSIS — I350 Nonrheumatic aortic (valve) stenosis: Secondary | ICD-10-CM | POA: Diagnosis not present

## 2022-11-10 DIAGNOSIS — E785 Hyperlipidemia, unspecified: Secondary | ICD-10-CM | POA: Diagnosis not present

## 2022-11-10 DIAGNOSIS — I10 Essential (primary) hypertension: Secondary | ICD-10-CM | POA: Diagnosis not present

## 2022-11-10 LAB — ECHOCARDIOGRAM COMPLETE
AR max vel: 1.24 cm2
AV Area VTI: 1.35 cm2
AV Area mean vel: 1.24 cm2
AV Mean grad: 26 mmHg
AV Peak grad: 48.6 mmHg
Ao pk vel: 3.49 m/s
Area-P 1/2: 2.95 cm2
Est EF: >75
S' Lateral: 3.1 cm

## 2022-11-10 NOTE — Progress Notes (Signed)
Cardiac Individual Treatment Plan  Patient Details  Name: Barbara Lucero MRN: [id number] Date of Birth: 1940/09/04 Referring Provider:   Flowsheet Row CARDIAC REHAB PHASE II ORIENTATION from 11/10/2022 in [facility] [place] CARDIAC REHABILITATION  Referring Provider Dr. Clifton James       Initial Encounter Date:  Flowsheet Row CARDIAC REHAB PHASE II ORIENTATION from 11/10/2022 in [place] CARDIAC REHABILITATION  Date 11/10/22       Visit Diagnosis: S/P TAVR (transcatheter aortic valve replacement)  Patient's Home Medications on Admission:  Current Outpatient Medications:    acetaminophen (TYLENOL) 500 MG tablet, Take 1,000 mg by mouth 2 (two) times daily., Disp: , Rfl:    albuterol (VENTOLIN HFA) 108 (90 Base) MCG/ACT inhaler, Inhale 2 puffs into the lungs every 4 (four) hours as needed for shortness of breath or wheezing., Disp: , Rfl:    ALPRAZolam (XANAX) 0.25 MG tablet, Take 0.25 mg by mouth at bedtime., Disp: , Rfl:    Ascorbic Acid (VITAMIN C) 1000 MG tablet, Take 1,000 mg by mouth daily., Disp: , Rfl:    aspirin EC 81 MG tablet, Take 81 mg by mouth at bedtime. Swallow whole., Disp: , Rfl:    b complex vitamins tablet, Take 1 tablet by mouth daily., Disp: , Rfl:    Calcium Carb-Cholecalciferol (CALCIUM 600+D) 600-20 MG-MCG TABS, Take 1 tablet by mouth daily., Disp: , Rfl:    Cholecalciferol (VITAMIN D3) 50 MCG (2000 UT) capsule, Take 2,000 Units by mouth daily., Disp: , Rfl:    cyanocobalamin (VITAMIN B12) 1000 MCG tablet, Take 1,000 mcg by mouth daily as needed (energy)., Disp: , Rfl:    furosemide (LASIX) 20 MG tablet, Take 20 mg by mouth every Wednesday., Disp: , Rfl:    gabapentin (NEURONTIN) 100 MG capsule, Take 1 capsule (100 mg total) by mouth 2 (two) times daily., Disp: , Rfl:    hydrocortisone cream 1 %, Apply 1 Application topically 2 (two) times daily as needed (rash)., Disp: , Rfl:    Multiple Vitamins-Minerals (MULTIVITAMIN WITH MINERALS) tablet, Take 1 tablet by mouth daily.,  Disp: , Rfl:    olmesartan (BENICAR) 40 MG tablet, Take 40 mg by mouth daily., Disp: , Rfl:    pantoprazole (PROTONIX) 40 MG tablet, Take 40 mg by mouth daily., Disp: , Rfl:    Psyllium (METAMUCIL PO), Take 1 Dose by mouth daily as needed (constipation). 1 dose = 1 teaspoonful, Disp: , Rfl:    sertraline (ZOLOFT) 100 MG tablet, Take 100 mg by mouth at bedtime., Disp: , Rfl:    simvastatin (ZOCOR) 10 MG tablet, Take 10 mg by mouth at bedtime., Disp: , Rfl:    solifenacin (VESICARE) 10 MG tablet, TAKE (1) TABLET BY MOUTH AT BEDTIME., Disp: 30 tablet, Rfl: 11   sulfaSALAzine (AZULFIDINE) 500 MG tablet, Take 1 tablet (500 mg total) by mouth 2 (two) times daily., Disp: 60 tablet, Rfl: 2   vitamin E 400 UNIT capsule, Take 400 Units by mouth daily., Disp: , Rfl:    Multiple Vitamins-Minerals (HAIR SKIN & NAILS) TABS, Take 3 tablets by mouth daily., Disp: , Rfl:   Past Medical History: Past Medical History:  Diagnosis Date   Anxiety    Arthritis    Cancer (HCC)    face- removed in office   Crohn disease (HCC)    Depression    DVT (deep venous thrombosis) (HCC) 07/27/2017   LLE   GERD (gastroesophageal reflux disease)    HTN (hypertension)    Hyperlipidemia    Neuropathy  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 Clifton James & Dr. Leafy Ro   Severe aortic stenosis    Varicose veins of bilateral lower extremities with other complications     Tobacco Use: Social History   Tobacco Use  Smoking Status Former   Current packs/day: 0.50   Average packs/day: 0.5 packs/day for 20.0 years (10.0 ttl pk-yrs)   Types: Cigarettes  Smokeless Tobacco Never    Labs: Review Flowsheet       Latest Ref Rng & Units 11/22/2021 07/07/2022 10/07/2022  Labs for ITP Cardiac and Pulmonary Rehab  PH, Arterial 7.35 - 7.45 - 7.300  -  PCO2 arterial 32 - 48 mmHg - 54.1  -  Bicarbonate 20.0 - 28.0 mmol/L - 26.6  29.0  -  TCO2 22 -  32 mmol/L 30  28  31  26    O2 Saturation % - 94  74  -    Details       Multiple values from one day are sorted in reverse-chronological order         Capillary Blood Glucose: Lab Results  Component Value Date   GLUCAP 87 09/26/2022   GLUCAP 112 (H) 11/22/2021   GLUCAP 124 (H) 06/06/2016   GLUCAP 125 (H) 06/06/2016     Exercise Target Goals: Exercise Program Goal: Individual exercise prescription set using results from initial 6 min walk test and THRR while considering  patient's activity barriers and safety.   Exercise Prescription Goal: Starting with aerobic activity 30 plus minutes a day, 3 days per week for initial exercise prescription. Provide home exercise prescription and guidelines that participant acknowledges understanding prior to discharge.  Activity Barriers & Risk Stratification:  Activity Barriers & Cardiac Risk Stratification - 11/10/22 1424       Activity Barriers & Cardiac Risk Stratification   Activity Barriers Assistive Device;History of Falls;Balance Concerns;Deconditioning;Left Knee Replacement;Back Problems;Arthritis    Cardiac Risk Stratification Moderate             6 Minute Walk:  6 Minute Walk     Row Name 11/10/22 1539         6 Minute Walk   Phase Initial     Distance 570 feet     Walk Time 6 minutes     # of Rest Breaks 1     MPH 1.07     METS 0.92     RPE 13     VO2 Peak 3.22     Symptoms Yes (comment)     Comments pt used standing walker when doing walk test     Resting HR 62 bpm     Resting BP 110/60     Resting Oxygen Saturation  94 %     Exercise Oxygen Saturation  during 6 min walk 89 %     Max Ex. HR 102 bpm     Max Ex. BP 160/68     2 Minute Post BP 120/60              Oxygen Initial Assessment:   Oxygen Re-Evaluation:   Oxygen Discharge (Final Oxygen Re-Evaluation):   Initial Exercise Prescription:  Initial Exercise Prescription - 11/10/22 1500       Date of Initial Exercise RX and  Referring Provider   Date 11/10/22    Referring Provider Dr. Clifton James      Oxygen   Maintain Oxygen Saturation 88% or higher  NuStep   Level 1    SPM 80    Minutes 15      Arm Ergometer   Level 1    RPM 30    Minutes 15      Prescription Details   Frequency (times per week) 3    Duration Progress to 30 minutes of continuous aerobic without signs/symptoms of physical distress      Intensity   THRR 40-80% of Max Heartrate 93-124    Ratings of Perceived Exertion 11-13    Perceived Dyspnea 0-4      Resistance Training   Training Prescription Yes    Weight 2    Reps 10-15             Perform Capillary Blood Glucose checks as needed.  Exercise Prescription Changes:   Exercise Comments:   Exercise Goals and Review:   Exercise Goals     Row Name 11/10/22 1542             Exercise Goals   Increase Physical Activity Yes       Intervention Provide advice, education, support and counseling about physical activity/exercise needs.;Develop an individualized exercise prescription for aerobic and resistive training based on initial evaluation findings, risk stratification, comorbidities and participant's personal goals.       Expected Outcomes Short Term: Attend rehab on a regular basis to increase amount of physical activity.;Long Term: Add in home exercise to make exercise part of routine and to increase amount of physical activity.;Long Term: Exercising regularly at least 3-5 days a week.       Increase Strength and Stamina Yes       Intervention Provide advice, education, support and counseling about physical activity/exercise needs.;Develop an individualized exercise prescription for aerobic and resistive training based on initial evaluation findings, risk stratification, comorbidities and participant's personal goals.       Expected Outcomes Short Term: Increase workloads from initial exercise prescription for resistance, speed, and METs.;Short Term: Perform  resistance training exercises routinely during rehab and add in resistance training at home;Long Term: Improve cardiorespiratory fitness, muscular endurance and strength as measured by increased METs and functional capacity ( )       Able to understand and use rate of perceived exertion (RPE) scale Yes       Intervention Provide education and explanation on how to use RPE scale       Expected Outcomes Short Term: Able to use RPE daily in rehab to express subjective intensity level;Long Term:  Able to use RPE to guide intensity level when exercising independently       Knowledge and understanding of Target Heart Rate Range (THRR) Yes       Intervention Provide education and explanation of THRR including how the numbers were predicted and where they are located for reference       Expected Outcomes Short Term: Able to state/look up THRR;Long Term: Able to use THRR to govern intensity when exercising independently;Short Term: Able to use daily as guideline for intensity in rehab       Able to check pulse independently Yes       Intervention Provide education and demonstration on how to check pulse in carotid and radial arteries.;Review the importance of being able to check your own pulse for safety during independent exercise       Expected Outcomes Short Term: Able to explain why pulse checking is important during independent exercise       Understanding of Exercise Prescription Yes  Intervention Provide education, explanation, and written materials on patient's individual exercise prescription       Expected Outcomes Short Term: Able to explain program exercise prescription;Long Term: Able to explain home exercise prescription to exercise independently                Exercise Goals Re-Evaluation :    Discharge Exercise Prescription (Final Exercise Prescription Changes):   Nutrition:  Target Goals: Understanding of nutrition guidelines, daily intake of sodium 1500mg , cholesterol  200mg , calories 30% from fat and 7% or less from saturated fats, daily to have 5 or more servings of fruits and vegetables.  Biometrics:  Pre Biometrics - 11/10/22 1543       Pre Biometrics   Height 5\' 1"  (1.549 m)    Weight 80.1 kg    Waist Circumference 43 inches    Hip Circumference 45 inches    Waist to Hip Ratio 0.96 %    BMI (Calculated) 33.38    Grip Strength 17.1 kg              Nutrition Therapy Plan and Nutrition Goals:   Nutrition Assessments:  MEDIFICTS Score Key: >=70 Need to make dietary changes  40-70 Heart Healthy Diet <= 40 Therapeutic Level Cholesterol Diet  Flowsheet Row CARDIAC REHAB PHASE II ORIENTATION from 11/10/2022 in [facility] CARDIAC REHABILITATION  Picture Your Plate Total Score on Admission 26      Picture Your Plate Scores: <16 Unhealthy dietary pattern with much room for improvement. 41-50 Dietary pattern unlikely to meet recommendations for good health and room for improvement. 51-60 More healthful dietary pattern, with some room for improvement.  >60 Healthy dietary pattern, although there may be some specific behaviors that could be improved.    Nutrition Goals Re-Evaluation:   Nutrition Goals Discharge (Final Nutrition Goals Re-Evaluation):   Psychosocial: Target Goals: Acknowledge presence or absence of significant depression and/or stress, maximize coping skills, provide positive support system. Participant is able to verbalize types and ability to use techniques and skills needed for reducing stress and depression.  Initial Review & Psychosocial Screening:  Initial Psych Review & Screening - 11/10/22 1518       Initial Review   Current issues with Current Anxiety/Panic;Current Psychotropic Meds;History of Depression;Current Depression;Current Stress Concerns;Current Sleep Concerns    Source of Stress Concerns Family      Family Dynamics   Good Support System? Yes      Barriers   Psychosocial barriers to  participate in program There are no identifiable barriers or psychosocial needs.;The patient should benefit from training in stress management and relaxation.      Screening Interventions   Interventions Encouraged to exercise;To provide support and resources with identified psychosocial needs;Provide feedback about the scores to participant    Expected Outcomes Short Term goal: Utilizing psychosocial counselor, staff and physician to assist with identification of specific Stressors or current issues interfering with healing process. Setting desired goal for each stressor or current issue identified.;Long Term Goal: Stressors or current issues are controlled or eliminated.;Short Term goal: Identification and review with participant of any Quality of Life or Depression concerns found by scoring the questionnaire.;Long Term goal: The participant improves quality of Life and PHQ9 Scores as seen by post scores and/or verbalization of changes             Quality of Life Scores:  Quality of Life - 11/10/22 1551       Quality of Life   Select Quality of Life  Quality of Life Scores   Health/Function Pre 23.18 %    Socioeconomic Pre 25.43 %    Psych/Spiritual Pre 21.86 %    Family Pre 24 %    GLOBAL Pre 23.48 %            Scores of 19 and below usually indicate a poorer quality of life in these areas.  A difference of  2-3 points is a clinically meaningful difference.  A difference of 2-3 points in the total score of the Quality of Life Index has been associated with significant improvement in overall quality of life, self-image, physical symptoms, and general health in studies assessing change in quality of life.  PHQ-9: Review Flowsheet       11/10/2022  Depression screen PHQ 2/9  Decreased Interest 3  Down, Depressed, Hopeless 3  PHQ - 2 Score 6  Altered sleeping 3  Tired, decreased energy 3  Change in appetite 2  Feeling bad or failure about yourself  0  Trouble  concentrating 1  Moving slowly or fidgety/restless 1  Suicidal thoughts 0  PHQ-9 Score 16  Difficult doing work/chores Somewhat difficult    Details           Interpretation of Total Score  Total Score Depression Severity:  1-4 = Minimal depression, 5-9 = Mild depression, 10-14 = Moderate depression, 15-19 = Moderately severe depression, 20-27 = Severe depression   Psychosocial Evaluation and Intervention:  Psychosocial Evaluation - 11/10/22 1523       Psychosocial Evaluation & Interventions   Interventions Stress management education;Relaxation education;Encouraged to exercise with the program and follow exercise prescription    Comments Patient was referred to CR with TAVR. Her PHQ-9 score was 16. She is currently being treated for depression and anxiety with Zoloft. 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 She lives with her daughter who is bi-polar. She does support her some providing transportation. Her sister is her main support person and some friends at her church.  She says she has trouble staying asleep which she takes Alprazolam for which helps. She sleeps in a recliner. She says she slept by her son's bed in a recliner for so many years she just has not slept in her bed and is more comfortable in the recliner. Her main stressor in her life currently is her 86 year old granddaughter left home 2 years ago and has not contacted anyone in her family. She says she worries about her grandaugther everyday and can not get her off of her mind. She says she saw a counselor at her chruch for a few times after her son died but stopped going. She does not feel like she needs to see a Veterinary surgeon. She feels her depression and anxiety are managed okay with medication. Her main goals for the program are to improve her overall mobility, improve her strength and stamina and get healthier overall. She has no barriers  identified to participate in the program.    Expected Outcomes Short Term: attend the program consistently. Long term: meet her personal goals.    Continue Psychosocial Services  Follow up required by staff             Psychosocial Re-Evaluation:   Psychosocial Discharge (Final Psychosocial Re-Evaluation):   Vocational Rehabilitation: Provide vocational rehab assistance to qualifying candidates.   Vocational Rehab Evaluation & Intervention:  Vocational Rehab - 11/10/22 1433  Initial Vocational Rehab Evaluation & Intervention   Assessment shows need for Vocational Rehabilitation No      Vocational Rehab Re-Evaulation   Comments Patient is retired.             Education: Education Goals: Education classes will be provided on a weekly basis, covering required topics. Participant will state understanding/return demonstration of topics presented.  Learning Barriers/Preferences:  Learning Barriers/Preferences - 11/10/22 1433       Learning Barriers/Preferences   Learning Barriers Sight    Learning Preferences Written Material;Audio;Skilled Demonstration             Education Topics: Hypertension, Hypertension Reduction -Define heart disease and high blood pressure. Discus how high blood pressure affects the body and ways to reduce high blood pressure.   Exercise and Your Heart -Discuss why it is important to exercise, the FITT principles of exercise, normal and abnormal responses to exercise, and how to exercise safely.   Angina -Discuss definition of angina, causes of angina, treatment of angina, and how to decrease risk of having angina.   Cardiac Medications -Review what the following cardiac medications are used for, how they affect the body, and side effects that may occur when taking the medications.  Medications include Aspirin, Beta blockers, calcium channel blockers, ACE Inhibitors, angiotensin receptor blockers, diuretics, digoxin, and  antihyperlipidemics.   Congestive Heart Failure -Discuss the definition of CHF, how to live with CHF, the signs and symptoms of CHF, and how keep track of weight and sodium intake.   Heart Disease and Intimacy -Discus the effect sexual activity has on the heart, how changes occur during intimacy as we age, and safety during sexual activity.   Smoking Cessation / COPD -Discuss different methods to quit smoking, the health benefits of quitting smoking, and the definition of COPD.   Nutrition I: Fats -Discuss the types of cholesterol, what cholesterol does to the heart, and how cholesterol levels can be controlled.   Nutrition II: Labels -Discuss the different components of food labels and how to read food label   Heart Parts/Heart Disease and PAD -Discuss the anatomy of the heart, the pathway of blood circulation through the heart, and these are affected by heart disease.   Stress I: Signs and Symptoms -Discuss the causes of stress, how stress may lead to anxiety and depression, and ways to limit stress.   Stress II: Relaxation -Discuss different types of relaxation techniques to limit stress.   Warning Signs of Stroke / TIA -Discuss definition of a stroke, what the signs and symptoms are of a stroke, and how to identify when someone is having stroke.   Knowledge Questionnaire Score:  Knowledge Questionnaire Score - 11/10/22 1510       Knowledge Questionnaire Score   Pre Score 18/24             Core Components/Risk Factors/Patient Goals at Admission:  Personal Goals and Risk Factors at Admission - 11/10/22 1517       Core Components/Risk Factors/Patient Goals on Admission    Weight Management Weight Maintenance    Hypertension Yes    Intervention Provide education on lifestyle modifcations including regular physical activity/exercise, weight management, moderate sodium restriction and increased consumption of fresh fruit, vegetables, and low fat dairy, alcohol  moderation, and smoking cessation.;Monitor prescription use compliance.    Expected Outcomes Short Term: Continued assessment and intervention until BP is < 140/19mm HG in hypertensive participants. < 130/83mm HG in hypertensive participants with diabetes, heart failure or chronic kidney  disease.;Long Term: Maintenance of blood pressure at goal levels.    Lipids Yes    Intervention Provide education and support for participant on nutrition & aerobic/resistive exercise along with prescribed medications to achieve LDL 70mg , HDL >40mg .    Expected Outcomes Short Term: Participant states understanding of desired cholesterol values and is compliant with medications prescribed. Participant is following exercise prescription and nutrition guidelines.;Long Term: Cholesterol controlled with medications as prescribed, with individualized exercise RX and with personalized nutrition plan. Value goals: LDL < 70mg , HDL > 40 mg.    Stress Yes    Intervention Offer individual and/or small group education and counseling on adjustment to heart disease, stress management and health-related lifestyle change. Teach and support self-help strategies.;Refer participants experiencing significant psychosocial distress to appropriate mental health specialists for further evaluation and treatment. When possible, include family members and significant others in education/counseling sessions.    Expected Outcomes Short Term: Participant demonstrates changes in health-related behavior, relaxation and other stress management skills, ability to obtain effective social support, and compliance with psychotropic medications if prescribed.;Long Term: Emotional wellbeing is indicated by absence of clinically significant psychosocial distress or social isolation.             Core Components/Risk Factors/Patient Goals Review:    Core Components/Risk Factors/Patient Goals at Discharge (Final Review):    ITP Comments:   Comments:  Patient arrived for 1st visit/orientation/education at 1400. 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 During orientation advised patient on arrival and appointment times what to wear, what to do before, during and after exercise. Reviewed attendance and class policy.  Pt is scheduled to return Cardiac Rehab on 11/13/22 at 1500. Pt was advised to come to class 15 minutes before class starts.  Discussed RPE/Dpysnea scales. Patient participated in warm up stretches. Patient was able to complete 6 minute walk test.  Telemetry:NSR. Patient was measured for the equipment. Discussed equipment safety with patient. Took patient pre-anthropometric measurements. Patient finished visit at 1515.

## 2022-11-10 NOTE — Patient Instructions (Signed)
Medication Instructions:  Your physician recommends that you continue on your current medications as directed. Please refer to the Current Medication list given to you today.  *If you need a refill on your cardiac medications before your next appointment, please call your pharmacy*   Lab Work: None ordered  If you have labs (blood work) drawn today and your tests are completely normal, you will receive your results only by: MyChart Message (if you have MyChart) OR A paper copy in the mail If you have any lab test that is abnormal or we need to change your treatment, we will call you to review the results.   Testing/Procedures: None ordered   Follow-Up: At [facility], you and your health needs are our priority.  As part of our continuing mission to provide you with exceptional heart care, we have created designated Provider Care Teams.  These Care Teams include your primary Cardiologist (physician) and Advanced Practice Providers (APPs -  Physician Assistants and Nurse Practitioners) who all work together to provide you with the care you need, when you need it.  We recommend signing up for the patient portal called "MyChart".  Sign up information is provided on this After Visit Summary.  MyChart is used to connect with patients for Virtual Visits (Telemedicine).  Patients are able to view lab/test results, encounter notes, upcoming appointments, etc.  Non-urgent messages can be sent to your provider as well.   To learn more about what you can do with MyChart, go to [email or web address].    Your next appointment:   As scheduled  Provider:   Charlton Haws, MD     Other Instructions

## 2022-11-10 NOTE — Patient Instructions (Signed)
Patient Instructions  Patient Details  Name: Barbara Lucero MRN: [id number] Date of Birth: Nov 12, 1940 Referring Provider:  Kathleene Hazel*  Below are your personal goals for exercise, nutrition, and risk factors. Our goal is to help you stay on track towards obtaining and maintaining these goals. We will be discussing your progress on these goals with you throughout the program.  Initial Exercise Prescription:  Initial Exercise Prescription - 11/10/22 1500       Date of Initial Exercise RX and Referring Provider   Date 11/10/22    Referring Provider Dr. Clifton James      Oxygen   Maintain Oxygen Saturation 88% or higher      NuStep   Level 1    SPM 80    Minutes 15      Arm Ergometer   Level 1    RPM 30    Minutes 15      Prescription Details   Frequency (times per week) 3    Duration Progress to 30 minutes of continuous aerobic without signs/symptoms of physical distress      Intensity   THRR 40-80% of Max Heartrate 93-124    Ratings of Perceived Exertion 11-13    Perceived Dyspnea 0-4      Resistance Training   Training Prescription Yes    Weight 2    Reps 10-15             Exercise Goals: Frequency: Be able to perform aerobic exercise two to three times per week in program working toward 2-5 days per week of home exercise.  Intensity: Work with a perceived exertion of 11 (fairly light) - 15 (hard) while following your exercise prescription.  We will make changes to your prescription with you as you progress through the program.   Duration: Be able to do 30 to 45 minutes of continuous aerobic exercise in addition to a 5 minute warm-up and a 5 minute cool-down routine.   Nutrition Goals: Your personal nutrition goals will be established when you do your nutrition analysis with the dietician.  The following are general nutrition guidelines to follow: Cholesterol < 200mg /day Sodium < 1500mg /day Fiber: Women over 50 yrs - 21 grams per day  Personal  Goals:  Personal Goals and Risk Factors at Admission - 11/10/22 1517       Core Components/Risk Factors/Patient Goals on Admission    Weight Management Weight Maintenance    Hypertension Yes    Intervention Provide education on lifestyle modifcations including regular physical activity/exercise, weight management, moderate sodium restriction and increased consumption of fresh fruit, vegetables, and low fat dairy, alcohol moderation, and smoking cessation.;Monitor prescription use compliance.    Expected Outcomes Short Term: Continued assessment and intervention until BP is < 140/63mm HG in hypertensive participants. < 130/36mm HG in hypertensive participants with diabetes, heart failure or chronic kidney disease.;Long Term: Maintenance of blood pressure at goal levels.    Lipids Yes    Intervention Provide education and support for participant on nutrition & aerobic/resistive exercise along with prescribed medications to achieve LDL 70mg , HDL >40mg .    Expected Outcomes Short Term: Participant states understanding of desired cholesterol values and is compliant with medications prescribed. Participant is following exercise prescription and nutrition guidelines.;Long Term: Cholesterol controlled with medications as prescribed, with individualized exercise RX and with personalized nutrition plan. Value goals: LDL < 70mg , HDL > 40 mg.    Stress Yes    Intervention Offer individual and/or small group education and counseling on adjustment  to heart disease, stress management and health-related lifestyle change. Teach and support self-help strategies.;Refer participants experiencing significant psychosocial distress to appropriate mental health specialists for further evaluation and treatment. When possible, include family members and significant others in education/counseling sessions.    Expected Outcomes Short Term: Participant demonstrates changes in health-related behavior, relaxation and other stress  management skills, ability to obtain effective social support, and compliance with psychotropic medications if prescribed.;Long Term: Emotional wellbeing is indicated by absence of clinically significant psychosocial distress or social isolation.             Tobacco Use Initial Evaluation: Social History   Tobacco Use  Smoking Status Former   Current packs/day: 0.50   Average packs/day: 0.5 packs/day for 20.0 years (10.0 ttl pk-yrs)   Types: Cigarettes  Smokeless Tobacco Never    Exercise Goals and Review:  Exercise Goals     Row Name 11/10/22 1542             Exercise Goals   Increase Physical Activity Yes       Intervention Provide advice, education, support and counseling about physical activity/exercise needs.;Develop an individualized exercise prescription for aerobic and resistive training based on initial evaluation findings, risk stratification, comorbidities and participant's personal goals.       Expected Outcomes Short Term: Attend rehab on a regular basis to increase amount of physical activity.;Long Term: Add in home exercise to make exercise part of routine and to increase amount of physical activity.;Long Term: Exercising regularly at least 3-5 days a week.       Increase Strength and Stamina Yes       Intervention Provide advice, education, support and counseling about physical activity/exercise needs.;Develop an individualized exercise prescription for aerobic and resistive training based on initial evaluation findings, risk stratification, comorbidities and participant's personal goals.       Expected Outcomes Short Term: Increase workloads from initial exercise prescription for resistance, speed, and METs.;Short Term: Perform resistance training exercises routinely during rehab and add in resistance training at home;Long Term: Improve cardiorespiratory fitness, muscular endurance and strength as measured by increased METs and functional capacity ( )       Able  to understand and use rate of perceived exertion (RPE) scale Yes       Intervention Provide education and explanation on how to use RPE scale       Expected Outcomes Short Term: Able to use RPE daily in rehab to express subjective intensity level;Long Term:  Able to use RPE to guide intensity level when exercising independently       Knowledge and understanding of Target Heart Rate Range (THRR) Yes       Intervention Provide education and explanation of THRR including how the numbers were predicted and where they are located for reference       Expected Outcomes Short Term: Able to state/look up THRR;Long Term: Able to use THRR to govern intensity when exercising independently;Short Term: Able to use daily as guideline for intensity in rehab       Able to check pulse independently Yes       Intervention Provide education and demonstration on how to check pulse in carotid and radial arteries.;Review the importance of being able to check your own pulse for safety during independent exercise       Expected Outcomes Short Term: Able to explain why pulse checking is important during independent exercise       Understanding of Exercise Prescription Yes  Intervention Provide education, explanation, and written materials on patient's individual exercise prescription       Expected Outcomes Short Term: Able to explain program exercise prescription;Long Term: Able to explain home exercise prescription to exercise independently                Copy of goals given to participant.

## 2022-11-11 ENCOUNTER — Other Ambulatory Visit: Payer: Self-pay | Admitting: Cardiology

## 2022-11-11 ENCOUNTER — Encounter: Payer: Self-pay | Admitting: Emergency Medicine

## 2022-11-11 ENCOUNTER — Encounter: Payer: Self-pay | Admitting: Cardiology

## 2022-11-11 DIAGNOSIS — Z952 Presence of prosthetic heart valve: Secondary | ICD-10-CM

## 2022-11-13 ENCOUNTER — Encounter (HOSPITAL_COMMUNITY)
Admission: RE | Admit: 2022-11-13 | Discharge: 2022-11-13 | Disposition: A | Discharge status: Home / Self Care | Payer: Medicare Other | Source: Ambulatory Visit | Attending: Cardiovascular Disease | Admitting: Cardiovascular Disease

## 2022-11-13 DIAGNOSIS — Z952 Presence of prosthetic heart valve: Secondary | ICD-10-CM

## 2022-11-13 NOTE — Progress Notes (Signed)
Daily Session Note  Patient Details  Name: DARENDA BAUDOIN MRN: [id number] Date of Birth: [birth year] Referring Provider:   Flowsheet Row CARDIAC REHAB PHASE II ORIENTATION from 11/10/2022 in [facility] CARDIAC REH[facility]IL[facility]  Referring Provider Dr. Clifton James       Encounter Date: 11/13/2022  Check In:  Session Check In - 11/13/22 1445       Check-In   Supervising physician immediately available to respond to emergencies [facility] MD immediately available    Physician(s) Dr. Jenene Slicker    Location [facility]    Staff Present Ross Ludwig, BS, Exercise Physiologist;Charae Depaolis BSN, RN;Daphyne Daphine Deutscher, RN, BSN    Virtual Visit No    Medication changes reported     No    Fall or balance concerns reported    Yes    Comments pt uses rolling walker    Tobacco Cessation No Change    Warm-up and Cool-down Performed on first and last piece of equipment    Resistance Training Performed Yes    VAD Patient? No    PAD/SET Patient? No      Pain Assessment   Currently in Pain? No/denies    Pain Score 0-No pain    Multiple Pain Sites No             Capillary Blood Glucose: No results found for this or any previous visit (from the past 24 hour(s)).    Social History   Tobacco Use  Smoking Status Former   Current packs/day: 0.50   Average packs/day: 0.5 packs/day for 20.0 years (10.0 ttl pk-yrs)   Types: Cigarettes  Smokeless Tobacco Never    Goals Met:  Independence with exercise equipment Exercise tolerated well No report of concerns or symptoms today Strength training completed today  Goals Unmet:  Not Applicable  Comments: Marland KitchenMarland KitchenFirst full day of exercise!  Patient was oriented to gym and equipment including functions, settings, policies, and procedures.  Patient's individual exercise prescription and treatment plan were reviewed.  All starting workloads were established based on the results of the 6 minute walk test done at initial orientation visit.  The  plan for exercise progression was also introduced and progression will be customized based on patient's performance and goals.    Dr. Dina Rich is Medical Director for [facility]

## 2022-11-18 ENCOUNTER — Other Ambulatory Visit: Payer: Self-pay | Admitting: Cardiology

## 2022-11-18 ENCOUNTER — Other Ambulatory Visit (HOSPITAL_COMMUNITY)
Admission: RE | Admit: 2022-11-18 | Discharge: 2022-11-18 | Disposition: A | Discharge status: Home / Self Care | Payer: Medicare Other | Source: Ambulatory Visit | Attending: Cardiology | Admitting: Cardiology

## 2022-11-18 ENCOUNTER — Ambulatory Visit: Payer: Medicare Other | Admitting: Obstetrics & Gynecology

## 2022-11-18 ENCOUNTER — Encounter: Payer: Self-pay | Admitting: Obstetrics & Gynecology

## 2022-11-18 ENCOUNTER — Encounter (HOSPITAL_COMMUNITY)
Admission: RE | Admit: 2022-11-18 | Discharge: 2022-11-18 | Disposition: A | Discharge status: Home / Self Care | Payer: Medicare Other | Source: Ambulatory Visit | Attending: Cardiovascular Disease

## 2022-11-18 VITALS — BP 126/75 | HR 69

## 2022-11-18 DIAGNOSIS — Z4689 Encounter for fitting and adjustment of other specified devices: Secondary | ICD-10-CM | POA: Diagnosis not present

## 2022-11-18 DIAGNOSIS — N813 Complete uterovaginal prolapse: Secondary | ICD-10-CM | POA: Diagnosis not present

## 2022-11-18 DIAGNOSIS — Z952 Presence of prosthetic heart valve: Secondary | ICD-10-CM | POA: Diagnosis not present

## 2022-11-18 DIAGNOSIS — N3281 Overactive bladder: Secondary | ICD-10-CM

## 2022-11-18 LAB — BASIC METABOLIC PANEL
Anion gap: 6 (ref 5–15)
BUN: 17 mg/dL (ref 8–23)
CO2: 32 mmol/L (ref 22–32)
Calcium: 9.1 mg/dL (ref 8.9–10.3)
Chloride: 100 mmol/L (ref 98–111)
Creatinine, Ser: 0.95 mg/dL (ref 0.44–1.00)
GFR, Estimated: >60 mL/min (ref >60)
Glucose, Bld: 91 mg/dL (ref 70–99)
Potassium: 4.1 mmol/L (ref 3.5–5.1)
Sodium: 138 mmol/L (ref 135–145)

## 2022-11-18 NOTE — Progress Notes (Signed)
Chief Complaint  Patient presents with   Pessary Check    Blood pressure 126/75, pulse 69.  Barbara Lucero presents today for routine follow up related to her pessary.   She uses a Milex ring with support #5 She reports no vaginal discharge and no vaginal bleeding   Likert scale(1 not bothersome -5 very bothersome)  :  1  Exam reveals no undue vaginal mucosal pressure of breakdown, no discharge and no vaginal bleeding.  Vaginal Epithelial Abnormality Classification System:   0 0    No abnormalities 1    Epithelial erythema 2    Granulation tissue 3    Epithelial break or erosion, 1 cm or less 4    Epithelial break or erosion, 1 cm or greater  The pessary is removed, cleaned and replaced without difficulty.      ICD-10-CM   1. 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 OAB (overactive bladder): chronic + suboptimally managed  N32.81        Barbara Lucero will be sen back in 4 months for continued follow up.  Lazaro Arms, MD  11/18/2022 12:13 PM

## 2022-11-18 NOTE — Progress Notes (Signed)
Daily Session Note  Patient Details  Name: Barbara Lucero MRN: [id number] Date of Birth: Jan 07, 1941 Referring Provider:   Flowsheet Row CARDIAC REHAB PHASE II ORIENTATION from 11/10/2022 in [facility] CARDIAC REHABIL[facility]ATION  Referring Provider Dr. Clifton James       Encounter Date: 11/18/2022  Check In:  Session Check In - 11/18/22 1445       Check-In   Supervising physician immediately available to respond to emergencies See telemetry face sheet for immediately available MD    Location [facility]    Staff Present Ross Ludwig, BS, Exercise Physiologist;Rashiya Lofland Daphine Deutscher, RN, BSN;Phyllis Billingsley, RN    Virtual Visit No    Medication changes reported     No    Fall or balance concerns reported    Yes    Comments pt uses rolling walker    Tobacco Cessation No Change    Warm-up and Cool-down Performed on first and last piece of equipment    Resistance Training Performed Yes    VAD Patient? No      Pain Assessment   Currently in Pain? No/denies    Pain Score 0-No pain             Capillary Blood Glucose: Results for orders placed or performed during the hospital encounter of 11/18/22 (from the past 24 hour(s))  Basic metabolic panel     Status: None   Collection Time: 11/18/22  9:33 AM  Result Value Ref Range   Sodium 138 135 - 145 mmol/L   Potassium 4.1 3.5 - 5.1 mmol/L   Chloride 100 98 - 111 mmol/L   CO2 32 22 - 32 mmol/L   Glucose, Bld 91 70 - 99 mg/dL   BUN 17 8 - 23 mg/dL   Creatinine, Ser 1.32 0.44 - 1.00 mg/dL   Calcium 9.1 8.9 - 44.0 mg/dL   GFR, Estimated >10 >27 mL/min   Anion gap 6 5 - 15      Social History   Tobacco Use  Smoking Status Former   Current packs/day: 0.50   Average packs/day: 0.5 packs/day for 20.0 years (10.0 ttl pk-yrs)   Types: Cigarettes  Smokeless Tobacco Never    Goals Met:  Independence with exercise equipment Exercise tolerated well No report of concerns or symptoms today Strength training completed  today  Goals Unmet:  Not Applicable  Comments: Pt able to follow exercise prescription today without complaint.  Will continue to monitor for progression.    Dr. Dina Rich is Medical Director for [facility] Card[facility]

## 2022-11-19 ENCOUNTER — Ambulatory Visit (HOSPITAL_COMMUNITY)
Admission: RE | Admit: 2022-11-19 | Discharge: 2022-11-19 | Disposition: A | Discharge status: Home / Self Care | Payer: Medicare Other | Source: Ambulatory Visit | Attending: Cardiology | Admitting: Cardiology

## 2022-11-19 DIAGNOSIS — C3411 Malignant neoplasm of upper lobe, right bronchus or lung: Secondary | ICD-10-CM | POA: Diagnosis not present

## 2022-11-19 DIAGNOSIS — I700 Atherosclerosis of aorta: Secondary | ICD-10-CM | POA: Insufficient documentation

## 2022-11-19 DIAGNOSIS — I2510 Atherosclerotic heart disease of native coronary artery without angina pectoris: Secondary | ICD-10-CM | POA: Diagnosis not present

## 2022-11-19 DIAGNOSIS — Z952 Presence of prosthetic heart valve: Secondary | ICD-10-CM | POA: Insufficient documentation

## 2022-11-19 DIAGNOSIS — I517 Cardiomegaly: Secondary | ICD-10-CM | POA: Diagnosis not present

## 2022-11-19 MED ORDER — DILTIAZEM HCL 25 MG/5ML IV SOLN: 10.0000 mg | INTRAVENOUS | Status: DC | PRN

## 2022-11-19 MED ORDER — IOHEXOL 350 MG/ML SOLN
95.0000 mL | Freq: Once | INTRAVENOUS | Status: AC | PRN
  Administered 2022-11-19: 95 mL via INTRAVENOUS

## 2022-11-19 MED ORDER — NITROGLYCERIN 0.4 MG SL SUBL: 0.8000 mg | SUBLINGUAL_TABLET | Freq: Once | SUBLINGUAL | Status: DC

## 2022-11-19 MED ORDER — METOPROLOL TARTRATE 5 MG/5ML IV SOLN: 10.0000 mg | Freq: Once | INTRAVENOUS | Status: DC | PRN

## 2022-11-20 ENCOUNTER — Encounter (HOSPITAL_COMMUNITY): Payer: Medicare Other

## 2022-11-20 ENCOUNTER — Other Ambulatory Visit: Payer: Self-pay | Admitting: Cardiology

## 2022-11-20 DIAGNOSIS — Z952 Presence of prosthetic heart valve: Secondary | ICD-10-CM

## 2022-11-25 ENCOUNTER — Encounter (HOSPITAL_COMMUNITY): Payer: Medicare Other

## 2022-11-25 DIAGNOSIS — Z952 Presence of prosthetic heart valve: Secondary | ICD-10-CM | POA: Insufficient documentation

## 2022-11-27 ENCOUNTER — Encounter (HOSPITAL_COMMUNITY): Payer: Medicare Other

## 2022-12-02 ENCOUNTER — Encounter (HOSPITAL_COMMUNITY)
Admission: RE | Admit: 2022-12-02 | Discharge: 2022-12-02 | Disposition: A | Discharge status: Home / Self Care | Payer: Medicare Other | Source: Ambulatory Visit | Attending: Cardiovascular Disease | Admitting: Cardiovascular Disease

## 2022-12-02 DIAGNOSIS — Z952 Presence of prosthetic heart valve: Secondary | ICD-10-CM | POA: Diagnosis not present

## 2022-12-02 NOTE — Progress Notes (Signed)
Daily Session Note  Patient Details  Name: Barbara Lucero MRN: [id number] Date of Birth: 1940-03-27 Referring Provider:   Flowsheet Row CARDIAC REHAB PHASE II ORIENTATION from 11/10/2022 in [facility] CARDIAC REHABILITATION  Referring Provider Dr. Clifton James       Encounter Date: 12/02/2022  Check In:  Session Check In - 12/02/22 1430       Check-In   Supervising physician immediately available to respond to emergencies See telemetry face sheet for immediately available MD    Location [facility]    Staff Present Ross Ludwig, BS, Exercise Physiologist;Phyllis Billingsley, RN;Daphyne Daphine Deutscher, RN, BSN    Virtual Visit No    Medication changes reported     No    Fall or balance concerns reported    Yes    Comments pt uses rolling walker    Tobacco Cessation No Change    Warm-up and Cool-down Performed on first and last piece of equipment    Resistance Training Performed Yes    VAD Patient? No    PAD/SET Patient? No      Pain Assessment   Currently in Pain? No/denies    Pain Score 0-No pain             Capillary Blood Glucose: No results found for this or any previous visit (from the past 24 hour(s)).    Social History   Tobacco Use  Smoking Status Former   Current packs/day: 0.50   Average packs/day: 0.5 packs/day for 20.0 years (10.0 ttl pk-yrs)   Types: Cigarettes  Smokeless Tobacco Never    Goals Met:  Independence with exercise equipment Exercise tolerated well No report of concerns or symptoms today Strength training completed today  Goals Unmet:  Not Applicable  Comments: Pt able to follow exercise prescription today without complaint.  Will continue to monitor for progression.    Dr. Dina Rich is Medical Director for [facility]

## 2022-12-03 ENCOUNTER — Encounter (HOSPITAL_COMMUNITY): Payer: Self-pay | Admitting: *Deleted

## 2022-12-03 DIAGNOSIS — Z952 Presence of prosthetic heart valve: Secondary | ICD-10-CM

## 2022-12-03 NOTE — Progress Notes (Signed)
Cardiac Individual Treatment Plan  Patient Details  Name: Barbara Lucero MRN: [id number] Date of Birth: [birth year] Referring Provider:   Flowsheet Row CARDIAC REHAB PHASE II ORIENTATION from 11/10/2022 in [facility] CARDIAC REHABILITATION  Referring Provider Dr. Clifton James       Initial Encounter Date:  Flowsheet Row CARDIAC REHAB PHASE II ORIENTATION from 11/10/2022 in [place] CARDIAC REHABILITATION  Date 11/10/22       Visit Diagnosis: S/P TAVR (transcatheter aortic valve replacement)  Patient's Home Medications on Admission:  Current Outpatient Medications:    acetaminophen (TYLENOL) 500 MG tablet, Take 1,000 mg by mouth 2 (two) times daily., Disp: , Rfl:    albuterol (VENTOLIN HFA) 108 (90 Base) MCG/ACT inhaler, Inhale 2 puffs into the lungs every 4 (four) hours as needed for shortness of breath or wheezing., Disp: , Rfl:    ALPRAZolam (XANAX) 0.25 MG tablet, Take 0.25 mg by mouth at bedtime., Disp: , Rfl:    Ascorbic Acid (VITAMIN C) 1000 MG tablet, Take 1,000 mg by mouth daily., Disp: , Rfl:    aspirin EC 81 MG tablet, Take 81 mg by mouth at bedtime. Swallow whole., Disp: , Rfl:    b complex vitamins tablet, Take 1 tablet by mouth daily., Disp: , Rfl:    Calcium Carb-Cholecalciferol (CALCIUM 600+D) 600-20 MG-MCG TABS, Take 1 tablet by mouth daily., Disp: , Rfl:    Cholecalciferol (VITAMIN D3) 50 MCG (2000 UT) capsule, Take 2,000 Units by mouth daily., Disp: , Rfl:    cyanocobalamin (VITAMIN B12) 1000 MCG tablet, Take 1,000 mcg by mouth daily as needed (energy)., Disp: , Rfl:    furosemide (LASIX) 20 MG tablet, Take 20 mg by mouth every Wednesday., Disp: , Rfl:    gabapentin (NEURONTIN) 100 MG capsule, Take 1 capsule (100 mg total) by mouth 2 (two) times daily., Disp: , Rfl:    hydrocortisone cream 1 %, Apply 1 Application topically 2 (two) times daily as needed (rash)., Disp: , Rfl:    Multiple Vitamins-Minerals (HAIR SKIN & NAILS) TABS, Take 3 tablets by mouth daily., Disp: ,  Rfl:    Multiple Vitamins-Minerals (MULTIVITAMIN WITH MINERALS) tablet, Take 1 tablet by mouth daily., Disp: , Rfl:    olmesartan (BENICAR) 40 MG tablet, Take 40 mg by mouth daily., Disp: , Rfl:    pantoprazole (PROTONIX) 40 MG tablet, Take 40 mg by mouth daily., Disp: , Rfl:    Psyllium (METAMUCIL PO), Take 1 Dose by mouth daily as needed (constipation). 1 dose = 1 teaspoonful, Disp: , Rfl:    sertraline (ZOLOFT) 100 MG tablet, Take 100 mg by mouth at bedtime., Disp: , Rfl:    simvastatin (ZOCOR) 10 MG tablet, Take 10 mg by mouth at bedtime., Disp: , Rfl:    solifenacin (VESICARE) 10 MG tablet, TAKE (1) TABLET BY MOUTH AT BEDTIME., Disp: 30 tablet, Rfl: 11   sulfaSALAzine (AZULFIDINE) 500 MG tablet, Take 1 tablet (500 mg total) by mouth 2 (two) times daily., Disp: 60 tablet, Rfl: 2   vitamin E 400 UNIT capsule, Take 400 Units by mouth daily., Disp: , Rfl:   Past Medical History: Past Medical History:  Diagnosis Date   Anxiety    Arthritis    Cancer (HCC)    face- removed in office   Crohn disease (HCC)    Depression    DVT (deep venous thrombosis) (HCC) 07/27/2017   LLE   GERD (gastroesophageal reflux disease)    HTN (hypertension)    Hyperlipidemia    Neuropathy  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 Clifton James & Dr. Leafy Ro   Severe aortic stenosis    Varicose veins of bilateral lower extremities with other complications     Tobacco Use: Social History   Tobacco Use  Smoking Status Former   Current packs/day: 0.50   Average packs/day: 0.5 packs/day for 20.0 years (10.0 ttl pk-yrs)   Types: Cigarettes  Smokeless Tobacco Never    Labs: Review Flowsheet       Latest Ref Rng & Units 11/22/2021 07/07/2022 10/07/2022  Labs for ITP Cardiac and Pulmonary Rehab  PH, Arterial 7.35 - 7.45 - 7.300  -  PCO2 arterial 32 - 48 mmHg - 54.1  -  Bicarbonate 20.0 - 28.0 mmol/L - 26.6  29.0  -  TCO2 22 -  32 mmol/L 30  28  31  26    O2 Saturation % - 94  74  -    Details       Multiple values from one day are sorted in reverse-chronological order         Capillary Blood Glucose: Lab Results  Component Value Date   GLUCAP 87 09/26/2022   GLUCAP 112 (H) 11/22/2021   GLUCAP 124 (H) 06/06/2016   GLUCAP 125 (H) 06/06/2016     Exercise Target Goals: Exercise Program Goal: Individual exercise prescription set using results from initial 6 min walk test and THRR while considering  patient's activity barriers and safety.   Exercise Prescription Goal: Starting with aerobic activity 30 plus minutes a day, 3 days per week for initial exercise prescription. Provide home exercise prescription and guidelines that participant acknowledges understanding prior to discharge.  Activity Barriers & Risk Stratification:  Activity Barriers & Cardiac Risk Stratification - 11/10/22 1424       Activity Barriers & Cardiac Risk Stratification   Activity Barriers Assistive Device;History of Falls;Balance Concerns;Deconditioning;Left Knee Replacement;Back Problems;Arthritis    Cardiac Risk Stratification Moderate             6 Minute Walk:  6 Minute Walk     Row Name 11/10/22 1539         6 Minute Walk   Phase Initial     Distance 570 feet     Walk Time 6 minutes     # of Rest Breaks 1     MPH 1.07     METS 0.92     RPE 13     VO2 Peak 3.22     Symptoms Yes (comment)     Comments pt used standing walker when doing walk test     Resting HR 62 bpm     Resting BP 110/60     Resting Oxygen Saturation  94 %     Exercise Oxygen Saturation  during 6 min walk 89 %     Max Ex. HR 102 bpm     Max Ex. BP 160/68     2 Minute Post BP 120/60              Oxygen Initial Assessment:   Oxygen Re-Evaluation:   Oxygen Discharge (Final Oxygen Re-Evaluation):   Initial Exercise Prescription:  Initial Exercise Prescription - 11/10/22 1500       Date of Initial Exercise RX and  Referring Provider   Date 11/10/22    Referring Provider Dr. Clifton James      Oxygen   Maintain Oxygen Saturation 88% or higher  NuStep   Level 1    SPM 80    Minutes 15      Arm Ergometer   Level 1    RPM 30    Minutes 15      Prescription Details   Frequency (times per week) 3    Duration Progress to 30 minutes of continuous aerobic without signs/symptoms of physical distress      Intensity   THRR 40-80% of Max Heartrate 93-124    Ratings of Perceived Exertion 11-13    Perceived Dyspnea 0-4      Resistance Training   Training Prescription Yes    Weight 2    Reps 10-15             Perform Capillary Blood Glucose checks as needed.  Exercise Prescription Changes:   Exercise Comments:   Exercise Goals and Review:   Exercise Goals     Row Name 11/10/22 1542             Exercise Goals   Increase Physical Activity Yes       Intervention Provide advice, education, support and counseling about physical activity/exercise needs.;Develop an individualized exercise prescription for aerobic and resistive training based on initial evaluation findings, risk stratification, comorbidities and participant's personal goals.       Expected Outcomes Short Term: Attend rehab on a regular basis to increase amount of physical activity.;Long Term: Add in home exercise to make exercise part of routine and to increase amount of physical activity.;Long Term: Exercising regularly at least 3-5 days a week.       Increase Strength and Stamina Yes       Intervention Provide advice, education, support and counseling about physical activity/exercise needs.;Develop an individualized exercise prescription for aerobic and resistive training based on initial evaluation findings, risk stratification, comorbidities and participant's personal goals.       Expected Outcomes Short Term: Increase workloads from initial exercise prescription for resistance, speed, and METs.;Short Term: Perform  resistance training exercises routinely during rehab and add in resistance training at home;Long Term: Improve cardiorespiratory fitness, muscular endurance and strength as measured by increased METs and functional capacity ( )       Able to understand and use rate of perceived exertion (RPE) scale Yes       Intervention Provide education and explanation on how to use RPE scale       Expected Outcomes Short Term: Able to use RPE daily in rehab to express subjective intensity level;Long Term:  Able to use RPE to guide intensity level when exercising independently       Knowledge and understanding of Target Heart Rate Range (THRR) Yes       Intervention Provide education and explanation of THRR including how the numbers were predicted and where they are located for reference       Expected Outcomes Short Term: Able to state/look up THRR;Long Term: Able to use THRR to govern intensity when exercising independently;Short Term: Able to use daily as guideline for intensity in rehab       Able to check pulse independently Yes       Intervention Provide education and demonstration on how to check pulse in carotid and radial arteries.;Review the importance of being able to check your own pulse for safety during independent exercise       Expected Outcomes Short Term: Able to explain why pulse checking is important during independent exercise       Understanding of Exercise Prescription Yes  Intervention Provide education, explanation, and written materials on patient's individual exercise prescription       Expected Outcomes Short Term: Able to explain program exercise prescription;Long Term: Able to explain home exercise prescription to exercise independently                Exercise Goals Re-Evaluation :  Exercise Goals Re-Evaluation     Row Name 12/02/22 1448             Exercise Goal Re-Evaluation   Exercise Goals Review Increase Physical Activity;Increase Strength and  Stamina;Understanding of Exercise Prescription       Comments Jerolyn Center is doing well in rehab. She was not here for a week due to her daughter not feeling well and that is her transportation. She still has her normal aches and pains in her knees and back. She stated that after she exercises she feels a little bit more energized. She has a pedel at home where she can do both her legs and also her arms. She does go to [employer] once in a while and does seated exercise.       Expected Outcomes Short term: increase levels on the Nustep   long term: continue to exercise at rehab and home                 Discharge Exercise Prescription (Final Exercise Prescription Changes):   Nutrition:  Target Goals: Understanding of nutrition guidelines, daily intake of sodium 1500mg , cholesterol 200mg , calories 30% from fat and 7% or less from saturated fats, daily to have 5 or more servings of fruits and vegetables.  Biometrics:  Pre Biometrics - 11/10/22 1543       Pre Biometrics   Height 5\' 1"  (1.549 m)    Weight 176 lb 9.4 oz (80.1 kg)    Waist Circumference 43 inches    Hip Circumference 45 inches    Waist to Hip Ratio 0.96 %    BMI (Calculated) 33.38    Grip Strength 17.1 kg              Nutrition Therapy Plan and Nutrition Goals:   Nutrition Assessments:  MEDIFICTS Score Key: >=70 Need to make dietary changes  40-70 Heart Healthy Diet <= 40 Therapeutic Level Cholesterol Diet  Flowsheet Row CARDIAC REHAB PHASE II ORIENTATION from 11/10/2022 in [facility] CARDIAC REHABILITATION  Picture Your Plate Total Score on Admission 26      Picture Your Plate Scores: <72 Unhealthy dietary pattern with much room for improvement. 41-50 Dietary pattern unlikely to meet recommendations for good health and room for improvement. 51-60 More healthful dietary pattern, with some room for improvement.  >60 Healthy dietary pattern, although there may be some specific behaviors that could be  improved.    Nutrition Goals Re-Evaluation:  Nutrition Goals Re-Evaluation     Row Name 12/02/22 1505             Goals   Nutrition Goal Healthy heathy       Comment Luthien stated that seh gained a few pounds and said she love ice cream. She eats a ice cream cone about everyday. She does try to eat mostly veggies and she eats a good amount of chicken. She does et smaller portions for meals and does not eat fried food due it hurting her stomach. She does cook most of her meals. She does like to go out once in a while and does choose healthy options.       Expected  Outcome Short term: try to cut back on ice cream throught the weeek    long term: contiue to pick healthy options for meal                Nutrition Goals Discharge (Final Nutrition Goals Re-Evaluation):  Nutrition Goals Re-Evaluation - 12/02/22 1505       Goals   Nutrition Goal Healthy heathy    Comment Cezanne stated that seh gained a few pounds and said she love ice cream. She eats a ice cream cone about everyday. She does try to eat mostly veggies and she eats a good amount of chicken. She does et smaller portions for meals and does not eat fried food due it hurting her stomach. She does cook most of her meals. She does like to go out once in a while and does choose healthy options.    Expected Outcome Short term: try to cut back on ice cream throught the weeek    long term: contiue to pick healthy options for meal             Psychosocial: Target Goals: Acknowledge presence or absence of significant depression and/or stress, maximize coping skills, provide positive support system. Participant is able to verbalize types and ability to use techniques and skills needed for reducing stress and depression.  Initial Review & Psychosocial Screening:  Initial Psych Review & Screening - 11/10/22 1518       Initial Review   Current issues with Current Anxiety/Panic;Current Psychotropic Meds;History of  Depression;Current Depression;Current Stress Concerns;Current Sleep Concerns    Source of Stress Concerns Family      Family Dynamics   Good Support System? Yes      Barriers   Psychosocial barriers to participate in program There are no identifiable barriers or psychosocial needs.;The patient should benefit from training in stress management and relaxation.      Screening Interventions   Interventions Encouraged to exercise;To provide support and resources with identified psychosocial needs;Provide feedback about the scores to participant    Expected Outcomes Short Term goal: Utilizing psychosocial counselor, staff and physician to assist with identification of specific Stressors or current issues interfering with healing process. Setting desired goal for each stressor or current issue identified.;Long Term Goal: Stressors or current issues are controlled or eliminated.;Short Term goal: Identification and review with participant of any Quality of Life or Depression concerns found by scoring the questionnaire.;Long Term goal: The participant improves quality of Life and PHQ9 Scores as seen by post scores and/or verbalization of changes             Quality of Life Scores:  Quality of Life - 11/10/22 1551       Quality of Life   Select Quality of Life      Quality of Life Scores   Health/Function Pre 23.18 %    Socioeconomic Pre 25.43 %    Psych/Spiritual Pre 21.86 %    Family Pre 24 %    GLOBAL Pre 23.48 %            Scores of 19 and below usually indicate a poorer quality of life in these areas.  A difference of  2-3 points is a clinically meaningful difference.  A difference of 2-3 points in the total score of the Quality of Life Index has been associated with significant improvement in overall quality of life, self-image, physical symptoms, and general health in studies assessing change in quality of life.  PHQ-9: Review Flowsheet  11/10/2022  Depression screen PHQ  2/9  Decreased Interest 3  Down, Depressed, Hopeless 3  PHQ - 2 Score 6  Altered sleeping 3  Tired, decreased energy 3  Change in appetite 2  Feeling bad or failure about yourself  0  Trouble concentrating 1  Moving slowly or fidgety/restless 1  Suicidal thoughts 0  PHQ-9 Score 16  Difficult doing work/chores Somewhat difficult    Details           Interpretation of Total Score  Total Score Depression Severity:  1-4 = Minimal depression, 5-9 = Mild depression, 10-14 = Moderate depression, 15-19 = Moderately severe depression, 20-27 = Severe depression   Psychosocial Evaluation and Intervention:  Psychosocial Evaluation - 11/10/22 1523       Psychosocial Evaluation & Interventions   Interventions Stress management education;Relaxation education;Encouraged to exercise with the program and follow exercise prescription    Comments Patient was referred to [facility] with TAVR. Her PHQ-9 score was 16. She is currently being treated for depression and anxiety with Zoloft. 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 She lives with her daughter who is bi-polar. She does support her some providing transportation. Her sister is her main support person and some friends at her church.  She says she has trouble staying asleep which she takes Alprazolam for which helps. She sleeps in a recliner. She says she slept by her son's bed in a recliner for so many years she just has not slept in her bed and is more comfortable in the recliner. Her main stressor in her life currently is her 20 year old granddaughter left home 2 years ago and has not contacted anyone in her family. She says she worries about her grandaugther everyday and can not get her off of her mind. She says she saw a counselor at her chruch for a few times after her son died but stopped going. She does not feel like she needs to see a Veterinary surgeon. She feels her depression  and anxiety are managed okay with medication. Her main goals for the program are to improve her overall mobility, improve her strength and stamina and get healthier overall. She has no barriers identified to participate in the program.    Expected Outcomes Short Term: attend the program consistently. Long term: meet her personal goals.    Continue Psychosocial Services  Follow up required by staff             Psychosocial Re-Evaluation:  Psychosocial Re-Evaluation     Row Name 12/02/22 1456             Psychosocial Re-Evaluation   Current issues with History of Depression;Current Depression;Current Sleep Concerns;Current Stress Concerns       Comments Rhia is doing well in rehab. She is currently taking Zoloft for depression and anxiety. She lives with her daughter who is bi-polar and she does support her by providing trasportation. She stated that she will sleep half the night and then wake up and can not get back to sleep. She still has stress about her granddaughter levaving home over a year ago and has not contacted the family. This is her main stressor and she does not have a way to contact her.       Expected Outcomes Short term: continue to pray about granddaughter to help fing relef in stress.   long term: continue to find joy  and be positive       Interventions Stress management education;Relaxation education;Encouraged to attend Cardiac Rehabilitation for the exercise       Continue Psychosocial Services  Follow up required by staff                Psychosocial Discharge (Final Psychosocial Re-Evaluation):  Psychosocial Re-Evaluation - 12/02/22 1456       Psychosocial Re-Evaluation   Current issues with History of Depression;Current Depression;Current Sleep Concerns;Current Stress Concerns    Comments Damari is doing well in rehab. She is currently taking Zoloft for depression and anxiety. She lives with her daughter who is bi-polar and she does support her by  providing trasportation. She stated that she will sleep half the night and then wake up and can not get back to sleep. She still has stress about her granddaughter levaving home over a year ago and has not contacted the family. This is her main stressor and she does not have a way to contact her.    Expected Outcomes Short term: continue to pray about granddaughter to help fing relef in stress.   long term: continue to find joy and be positive    Interventions Stress management education;Relaxation education;Encouraged to attend Cardiac Rehabilitation for the exercise    Continue Psychosocial Services  Follow up required by staff             Vocational Rehabilitation: Provide vocational rehab assistance to qualifying candidates.   Vocational Rehab Evaluation & Intervention:  Vocational Rehab - 11/10/22 1433       Initial Vocational Rehab Evaluation & Intervention   Assessment shows need for Vocational Rehabilitation No      Vocational Rehab Re-Evaulation   Comments Patient is retired.             Education: Education Goals: Education classes will be provided on a weekly basis, covering required topics. Participant will state understanding/return demonstration of topics presented.  Learning Barriers/Preferences:  Learning Barriers/Preferences - 11/10/22 1433       Learning Barriers/Preferences   Learning Barriers Sight    Learning Preferences Written Material;Audio;Skilled Demonstration             Education Topics: Hypertension, Hypertension Reduction -Define heart disease and high blood pressure. Discus how high blood pressure affects the body and ways to reduce high blood pressure.   Exercise and Your Heart -Discuss why it is important to exercise, the FITT principles of exercise, normal and abnormal responses to exercise, and how to exercise safely.   Angina -Discuss definition of angina, causes of angina, treatment of angina, and how to decrease risk of  having angina. Flowsheet Row CARDIAC REHAB PHASE II EXERCISE from 11/13/2022 in [place] CARDIAC REHABILITATION  Date 11/13/22  Educator [facility]       Cardiac Medications -Review what the following cardiac medications are used for, how they affect the body, and side effects that may occur when taking the medications.  Medications include Aspirin, Beta blockers, calcium channel blockers, ACE Inhibitors, angiotensin receptor blockers, diuretics, digoxin, and antihyperlipidemics.   Congestive Heart Failure -Discuss the definition of CHF, how to live with CHF, the signs and symptoms of CHF, and how keep track of weight and sodium intake.   Heart Disease and Intimacy -Discus the effect sexual activity has on the heart, how changes occur during intimacy as we age, and safety during sexual activity.   Smoking Cessation / COPD -Discuss different methods to quit smoking, the health benefits of quitting smoking, and  the definition of COPD.   Nutrition I: Fats -Discuss the types of cholesterol, what cholesterol does to the heart, and how cholesterol levels can be controlled.   Nutrition II: Labels -Discuss the different components of food labels and how to read food label   Heart Parts/Heart Disease and PAD -Discuss the anatomy of the heart, the pathway of blood circulation through the heart, and these are affected by heart disease.   Stress I: Signs and Symptoms -Discuss the causes of stress, how stress may lead to anxiety and depression, and ways to limit stress.   Stress II: Relaxation -Discuss different types of relaxation techniques to limit stress.   Warning Signs of Stroke / TIA -Discuss definition of a stroke, what the signs and symptoms are of a stroke, and how to identify when someone is having stroke.   Knowledge Questionnaire Score:  Knowledge Questionnaire Score - 11/10/22 1510       Knowledge Questionnaire Score   Pre Score 18/24             Core  Components/Risk Factors/Patient Goals at Admission:  Personal Goals and Risk Factors at Admission - 11/10/22 1517       Core Components/Risk Factors/Patient Goals on Admission    Weight Management Weight Maintenance    Hypertension Yes    Intervention Provide education on lifestyle modifcations including regular physical activity/exercise, weight management, moderate sodium restriction and increased consumption of fresh fruit, vegetables, and low fat dairy, alcohol moderation, and smoking cessation.;Monitor prescription use compliance.    Expected Outcomes Short Term: Continued assessment and intervention until BP is < 140/46mm HG in hypertensive participants. < 130/57mm HG in hypertensive participants with diabetes, heart failure or chronic kidney disease.;Long Term: Maintenance of blood pressure at goal levels.    Lipids Yes    Intervention Provide education and support for participant on nutrition & aerobic/resistive exercise along with prescribed medications to achieve LDL 70mg , HDL >40mg .    Expected Outcomes Short Term: Participant states understanding of desired cholesterol values and is compliant with medications prescribed. Participant is following exercise prescription and nutrition guidelines.;Long Term: Cholesterol controlled with medications as prescribed, with individualized exercise RX and with personalized nutrition plan. Value goals: LDL < 70mg , HDL > 40 mg.    Stress Yes    Intervention Offer individual and/or small group education and counseling on adjustment to heart disease, stress management and health-related lifestyle change. Teach and support self-help strategies.;Refer participants experiencing significant psychosocial distress to appropriate mental health specialists for further evaluation and treatment. When possible, include family members and significant others in education/counseling sessions.    Expected Outcomes Short Term: Participant demonstrates changes in  health-related behavior, relaxation and other stress management skills, ability to obtain effective social support, and compliance with psychotropic medications if prescribed.;Long Term: Emotional wellbeing is indicated by absence of clinically significant psychosocial distress or social isolation.             Core Components/Risk Factors/Patient Goals Review:   Goals and Risk Factor Review     Row Name 12/02/22 1513             Core Components/Risk Factors/Patient Goals Review   Personal Goals Review Weight Management/Obesity;Stress       Review Zaryia does not weigh everyday at home and stated that she has gained some weight. She is trying to cook healthy options and does eat smaller portions for meals. She does attend church at [employer] and loves her small group classes. Going to her small  group helps her talk to friends and helps with her stress in her life.       Expected Outcomes Short term: dodaily weight checks and cut back on ice cream   long term: continue to attend small groups to find joy                Core Components/Risk Factors/Patient Goals at Discharge (Final Review):   Goals and Risk Factor Review - 12/02/22 1513       Core Components/Risk Factors/Patient Goals Review   Personal Goals Review Weight Management/Obesity;Stress    Review Anslea does not weigh everyday at home and stated that she has gained some weight. She is trying to cook healthy options and does eat smaller portions for meals. She does attend church at [facility] and loves her small group classes. Going to her small group helps her talk to friends and helps with her stress in her life.    Expected Outcomes Short term: dodaily weight checks and cut back on ice cream   long term: continue to attend small groups to find joy             ITP Comments:  ITP Comments     Row Name 11/10/22 1558 11/13/22 1522 12/03/22 1643       ITP Comments Patient arrived for 1st visit/orientation/education at 1400.  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 During orientation advised patient on arrival and appointment times what to wear, what to do before, during and after exercise. Reviewed attendance and class policy.  Pt is scheduled to return Cardiac Rehab on 11/13/22 at 1500. Pt was advised to come to class 15 minutes before class starts.  Discussed RPE/Dpysnea scales. Patient participated in warm up stretches. Patient was able to complete 6 minute walk test.  Telemetry:NSR. Patient was measured for the equipment. Discussed equipment safety with patient. Took patient pre-anthropometric measurements. Patient finished visit at 1515. .First full day of exercise!  Patient was oriented to gym and equipment including functions, settings, policies, and procedures.  Patient's individual exercise prescription and treatment plan were reviewed.  All starting workloads were established based on the results of the 6 minute walk test done at initial orientation visit.  The plan for exercise progression was also introduced and progression will be customized based on patient's performance and goals. 30 day review completed. ITP sent to Dr. Dina Rich, Medical Director of Cardiac Rehab. Continue with ITP unless changes are made by physician.              Comments: 30 day review

## 2022-12-04 ENCOUNTER — Encounter (HOSPITAL_COMMUNITY): Payer: Medicare Other

## 2022-12-04 DIAGNOSIS — G8929 Other chronic pain: Secondary | ICD-10-CM | POA: Diagnosis not present

## 2022-12-04 DIAGNOSIS — M79604 Pain in right leg: Secondary | ICD-10-CM | POA: Diagnosis not present

## 2022-12-04 DIAGNOSIS — M79605 Pain in left leg: Secondary | ICD-10-CM | POA: Diagnosis not present

## 2022-12-04 DIAGNOSIS — Z23 Encounter for immunization: Secondary | ICD-10-CM | POA: Diagnosis not present

## 2022-12-09 ENCOUNTER — Encounter (HOSPITAL_COMMUNITY): Payer: Medicare Other

## 2022-12-11 ENCOUNTER — Encounter (HOSPITAL_COMMUNITY)
Admission: RE | Admit: 2022-12-11 | Discharge: 2022-12-11 | Disposition: A | Discharge status: Home / Self Care | Payer: Medicare Other | Source: Ambulatory Visit | Attending: Cardiovascular Disease | Admitting: Cardiovascular Disease

## 2022-12-11 DIAGNOSIS — Z952 Presence of prosthetic heart valve: Secondary | ICD-10-CM

## 2022-12-11 NOTE — Progress Notes (Signed)
Daily Session Note  Patient Details  Name: Barbara Lucero MRN: [id number] Date of Birth: January 21, 1941 Referring Provider:   Flowsheet Row CARDIAC REHAB PHASE II ORIENTATION from 11/10/2022 in [facility] CARDIAC REHABILITATION  Referring Provider Dr. Clifton James       Encounter Date: 12/11/2022  Check In:  Session Check In - 12/11/22 1427       Check-In   Supervising physician immediately available to respond to emergencies See telemetry face sheet for immediately available MD    Location [facility]    Staff Present Ross Ludwig, BS, Exercise Physiologist;Trula Frede BSN, RN;Other    Virtual Visit No    Medication changes reported     Yes    Comments Pt started on Tramadol 1/2 tablet everyday (12/11/22)    Fall or balance concerns reported    Yes    Comments pt uses rolling walker    Tobacco Cessation No Change    Warm-up and Cool-down Performed on first and last piece of equipment    Resistance Training Performed Yes    VAD Patient? No    PAD/SET Patient? No      Pain Assessment   Currently in Pain? No/denies    Pain Score 0-No pain    Multiple Pain Sites No             Capillary Blood Glucose: No results found for this or any previous visit (from the past 24 hour(s)).    Social History   Tobacco Use  Smoking Status Former   Current packs/day: 0.50   Average packs/day: 0.5 packs/day for 20.0 years (10.0 ttl pk-yrs)   Types: Cigarettes  Smokeless Tobacco Never    Goals Met:  Proper associated with RPD/PD & O2 Sat Independence with exercise equipment Using PLB without cueing & demonstrates good technique Exercise tolerated well Queuing for purse lip breathing No report of concerns or symptoms today Strength training completed today  Goals Unmet:  Not Applicable  Comments: Marland KitchenMarland KitchenPt able to follow exercise prescription today without complaint.  Will continue to monitor for progression.

## 2022-12-16 ENCOUNTER — Encounter (HOSPITAL_COMMUNITY)
Admission: RE | Admit: 2022-12-16 | Discharge: 2022-12-16 | Disposition: A | Discharge status: Home / Self Care | Payer: Medicare Other | Source: Ambulatory Visit | Attending: Cardiovascular Disease

## 2022-12-16 DIAGNOSIS — Z952 Presence of prosthetic heart valve: Secondary | ICD-10-CM

## 2022-12-16 NOTE — Progress Notes (Signed)
Daily Session Note  Patient Details  Name: Barbara Lucero MRN: [id number] Date of Birth: [birth year] Referring Provider:   Flowsheet Row CARDIAC REHAB PHASE II ORIENTATION from 11/10/2022 in [facility] CARDIAC REHABILITATION  Referring Provider Dr. Clifton James       Encounter Date: 12/16/2022  Check In:  Session Check In - 12/16/22 1445       Check-In   Supervising physician immediately available to respond to emergencies See telemetry face sheet for immediately available MD    Location [facility]    Staff Present Ross Ludwig, BS, Exercise Physiologist;Vijay Durflinger, RN;Jessica Ahtanum, MA, RCEP, CCRP, CCET    Virtual Visit No    Medication changes reported     No    Fall or balance concerns reported    No    Tobacco Cessation No Change    Warm-up and Cool-down Performed on first and last piece of equipment    Resistance Training Performed Yes    VAD Patient? No    PAD/SET Patient? No      Pain Assessment   Currently in Pain? No/denies    Pain Score 0-No pain    Multiple Pain Sites No             Capillary Blood Glucose: No results found for this or any previous visit (from the past 24 hour(s)).    Social History   Tobacco Use  Smoking Status Former   Current packs/day: 0.50   Average packs/day: 0.5 packs/day for 20.0 years (10.0 ttl pk-yrs)   Types: Cigarettes  Smokeless Tobacco Never    Goals Met:  Independence with exercise equipment Exercise tolerated well No report of concerns or symptoms today Strength training completed today  Goals Unmet:  Not Applicable  Comments: Pt able to follow exercise prescription today without complaint.  Will continue to monitor for progression.

## 2022-12-18 ENCOUNTER — Encounter (HOSPITAL_COMMUNITY): Payer: Medicare Other

## 2022-12-23 ENCOUNTER — Encounter (HOSPITAL_COMMUNITY): Payer: Medicare Other

## 2022-12-25 ENCOUNTER — Encounter (HOSPITAL_COMMUNITY)
Admission: RE | Admit: 2022-12-25 | Discharge: 2022-12-25 | Disposition: A | Discharge status: Home / Self Care | Payer: Medicare Other | Source: Ambulatory Visit | Attending: Cardiovascular Disease | Admitting: Cardiovascular Disease

## 2022-12-25 DIAGNOSIS — Z952 Presence of prosthetic heart valve: Secondary | ICD-10-CM | POA: Diagnosis not present

## 2022-12-25 NOTE — Progress Notes (Signed)
Daily Session Note  Patient Details  Name: Barbara Lucero MRN: [id number] Date of Birth: Aug 08, 1940 Referring Provider:   Flowsheet Row CARDIAC REHAB PHASE II ORIENTATION from 11/10/2022 in [facility] CARDIAC REHAB[facility]  Referring Provider Dr. Clifton James       Encounter Date: 12/25/2022  Check In:  Session Check In - 12/25/22 1430       Check-In   Supervising physician immediately available to respond to emergencies See telemetry face sheet for immediately available MD    Location [facility]    Staff Present Ross Ludwig, BS, Exercise Physiologist;Jessica [place], MA, RCEP, CCRP, Dow Adolph, RN, Film/video editor BSN, RN    Virtual Visit No    Medication changes reported     No    Fall or balance concerns reported    No    Tobacco Cessation No Change    Warm-up and Cool-down Performed on first and last piece of equipment    Resistance Training Performed Yes    VAD Patient? No    PAD/SET Patient? No      Pain Assessment   Currently in Pain? No/denies    Pain Score 0-No pain             Capillary Blood Glucose: No results found for this or any previous visit (from the past 24 hour(s)).    Social History   Tobacco Use  Smoking Status Former   Current packs/day: 0.50   Average packs/day: 0.5 packs/day for 20.0 years (10.0 ttl pk-yrs)   Types: Cigarettes  Smokeless Tobacco Never    Goals Met:  Independence with exercise equipment Exercise tolerated well No report of concerns or symptoms today Strength training completed today  Goals Unmet:  Not Applicable  Comments: Pt able to follow exercise prescription today without complaint.  Will continue to monitor for progression.

## 2022-12-30 ENCOUNTER — Encounter (HOSPITAL_COMMUNITY): Payer: Medicare Other

## 2022-12-30 DIAGNOSIS — Z952 Presence of prosthetic heart valve: Secondary | ICD-10-CM | POA: Insufficient documentation

## 2022-12-31 ENCOUNTER — Encounter (HOSPITAL_COMMUNITY): Payer: Self-pay | Admitting: *Deleted

## 2022-12-31 DIAGNOSIS — Z952 Presence of prosthetic heart valve: Secondary | ICD-10-CM

## 2022-12-31 NOTE — Progress Notes (Signed)
Cardiac Individual Treatment Plan  Patient Details  Name: Barbara Lucero MRN: [id number] Date of Birth: 07-11-1940 Referring Provider:   Flowsheet Row CARDIAC REHAB PHASE II ORIENTATION from 11/10/2022 in [facility] CARDIAC REHABILITATION  Referring Provider Dr. Clifton James       Initial Encounter Date:  Flowsheet Row CARDIAC REHAB PHASE II ORIENTATION from 11/10/2022 in [place] CARDIAC REHABILITATION  Date 11/10/22       Visit Diagnosis: S/P TAVR (transcatheter aortic valve replacement)  Patient's Home Medications on Admission:  Current Outpatient Medications:    acetaminophen (TYLENOL) 500 MG tablet, Take 1,000 mg by mouth 2 (two) times daily., Disp: , Rfl:    albuterol (VENTOLIN HFA) 108 (90 Base) MCG/ACT inhaler, Inhale 2 puffs into the lungs every 4 (four) hours as needed for shortness of breath or wheezing., Disp: , Rfl:    ALPRAZolam (XANAX) 0.25 MG tablet, Take 0.25 mg by mouth at bedtime., Disp: , Rfl:    Ascorbic Acid (VITAMIN C) 1000 MG tablet, Take 1,000 mg by mouth daily., Disp: , Rfl:    aspirin EC 81 MG tablet, Take 81 mg by mouth at bedtime. Swallow whole., Disp: , Rfl:    b complex vitamins tablet, Take 1 tablet by mouth daily., Disp: , Rfl:    Calcium Carb-Cholecalciferol (CALCIUM 600+D) 600-20 MG-MCG TABS, Take 1 tablet by mouth daily., Disp: , Rfl:    Cholecalciferol (VITAMIN D3) 50 MCG (2000 UT) capsule, Take 2,000 Units by mouth daily., Disp: , Rfl:    cyanocobalamin (VITAMIN B12) 1000 MCG tablet, Take 1,000 mcg by mouth daily as needed (energy)., Disp: , Rfl:    furosemide (LASIX) 20 MG tablet, Take 20 mg by mouth every Wednesday., Disp: , Rfl:    gabapentin (NEURONTIN) 100 MG capsule, Take 1 capsule (100 mg total) by mouth 2 (two) times daily., Disp: , Rfl:    hydrocortisone cream 1 %, Apply 1 Application topically 2 (two) times daily as needed (rash)., Disp: , Rfl:    Multiple Vitamins-Minerals (HAIR SKIN & NAILS) TABS, Take 3 tablets by mouth daily., Disp: ,  Rfl:    Multiple Vitamins-Minerals (MULTIVITAMIN WITH MINERALS) tablet, Take 1 tablet by mouth daily., Disp: , Rfl:    olmesartan (BENICAR) 40 MG tablet, Take 40 mg by mouth daily., Disp: , Rfl:    pantoprazole (PROTONIX) 40 MG tablet, Take 40 mg by mouth daily., Disp: , Rfl:    Psyllium (METAMUCIL PO), Take 1 Dose by mouth daily as needed (constipation). 1 dose = 1 teaspoonful, Disp: , Rfl:    sertraline (ZOLOFT) 100 MG tablet, Take 100 mg by mouth at bedtime., Disp: , Rfl:    simvastatin (ZOCOR) 10 MG tablet, Take 10 mg by mouth at bedtime., Disp: , Rfl:    solifenacin (VESICARE) 10 MG tablet, TAKE (1) TABLET BY MOUTH AT BEDTIME., Disp: 30 tablet, Rfl: 11   sulfaSALAzine (AZULFIDINE) 500 MG tablet, Take 1 tablet (500 mg total) by mouth 2 (two) times daily., Disp: 60 tablet, Rfl: 2   vitamin E 400 UNIT capsule, Take 400 Units by mouth daily., Disp: , Rfl:   Past Medical History: Past Medical History:  Diagnosis Date   Anxiety    Arthritis    Cancer (HCC)    face- removed in office   Crohn disease (HCC)    Depression    DVT (deep venous thrombosis) (HCC) 07/27/2017   LLE   GERD (gastroesophageal reflux disease)    HTN (hypertension)    Hyperlipidemia    Neuropathy  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 Clifton James & Dr. Leafy Ro   Severe aortic stenosis    Varicose veins of bilateral lower extremities with other complications     Tobacco Use: Social History   Tobacco Use  Smoking Status Former   Current packs/day: 0.50   Average packs/day: 0.5 packs/day for 20.0 years (10.0 ttl pk-yrs)   Types: Cigarettes  Smokeless Tobacco Never    Labs: Review Flowsheet       Latest Ref Rng & Units 11/22/2021 07/07/2022 10/07/2022  Labs for ITP Cardiac and Pulmonary Rehab  PH, Arterial 7.35 - 7.45 - 7.300  -  PCO2 arterial 32 - 48 mmHg - 54.1  -  Bicarbonate 20.0 - 28.0 mmol/L - 26.6  29.0  -  TCO2 22 -  32 mmol/L 30  28  31  26    O2 Saturation % - 94  74  -    Details       Multiple values from one day are sorted in reverse-chronological order         Capillary Blood Glucose: Lab Results  Component Value Date   GLUCAP 87 09/26/2022   GLUCAP 112 (H) 11/22/2021   GLUCAP 124 (H) 06/06/2016   GLUCAP 125 (H) 06/06/2016     Exercise Target Goals: Exercise Program Goal: Individual exercise prescription set using results from initial 6 min walk test and THRR while considering  patient's activity barriers and safety.   Exercise Prescription Goal: Starting with aerobic activity 30 plus minutes a day, 3 days per week for initial exercise prescription. Provide home exercise prescription and guidelines that participant acknowledges understanding prior to discharge.  Activity Barriers & Risk Stratification:  Activity Barriers & Cardiac Risk Stratification - 11/10/22 1424       Activity Barriers & Cardiac Risk Stratification   Activity Barriers Assistive Device;History of Falls;Balance Concerns;Deconditioning;Left Knee Replacement;Back Problems;Arthritis    Cardiac Risk Stratification Moderate             6 Minute Walk:  6 Minute Walk     Row Name 11/10/22 1539         6 Minute Walk   Phase Initial     Distance 570 feet     Walk Time 6 minutes     # of Rest Breaks 1     MPH 1.07     METS 0.92     RPE 13     VO2 Peak 3.22     Symptoms Yes (comment)     Comments pt used standing walker when doing walk test     Resting HR 62 bpm     Resting BP 110/60     Resting Oxygen Saturation  94 %     Exercise Oxygen Saturation  during 6 min walk 89 %     Max Ex. HR 102 bpm     Max Ex. BP 160/68     2 Minute Post BP 120/60              Oxygen Initial Assessment:   Oxygen Re-Evaluation:   Oxygen Discharge (Final Oxygen Re-Evaluation):   Initial Exercise Prescription:  Initial Exercise Prescription - 11/10/22 1500       Date of Initial Exercise RX and  Referring Provider   Date 11/10/22    Referring Provider Dr. Clifton James      Oxygen   Maintain Oxygen Saturation 88% or higher  NuStep   Level 1    SPM 80    Minutes 15      Arm Ergometer   Level 1    RPM 30    Minutes 15      Prescription Details   Frequency (times per week) 3    Duration Progress to 30 minutes of continuous aerobic without signs/symptoms of physical distress      Intensity   THRR 40-80% of Max Heartrate 93-124    Ratings of Perceived Exertion 11-13    Perceived Dyspnea 0-4      Resistance Training   Training Prescription Yes    Weight 2    Reps 10-15             Perform Capillary Blood Glucose checks as needed.  Exercise Prescription Changes:   Exercise Prescription Changes     Row Name 12/11/22 1500 12/25/22 1500           Response to Exercise   Blood Pressure (Admit) 118/70 122/60      Blood Pressure (Exercise) 110/70 126/64      Blood Pressure (Exit) 120/64 118/60      Heart Rate (Admit) 73 bpm 67 bpm      Heart Rate (Exercise) 106 bpm 121 bpm      Heart Rate (Exit) 77 bpm 73 bpm      Rating of Perceived Exertion (Exercise) 13 12      Duration Continue with 30 min of aerobic exercise without signs/symptoms of physical distress. Continue with 30 min of aerobic exercise without signs/symptoms of physical distress.      Intensity THRR unchanged THRR unchanged        Progression   Progression Continue to progress workloads to maintain intensity without signs/symptoms of physical distress. Continue to progress workloads to maintain intensity without signs/symptoms of physical distress.        Resistance Training   Training Prescription Yes Yes      Weight 2 2 lbs / orange band      Reps 10-15 10-15        NuStep   Level 2 2      SPM 77 85      Minutes 15 15      METs 2 2.4        Arm Ergometer   Level 1.5 1      RPM 91 75      Minutes 15 15      METs 2 2.1        Oxygen   Maintain Oxygen Saturation 88% or higher 88% or  higher               Exercise Comments:   Exercise Goals and Review:   Exercise Goals     Row Name 11/10/22 1542             Exercise Goals   Increase Physical Activity Yes       Intervention Provide advice, education, support and counseling about physical activity/exercise needs.;Develop an individualized exercise prescription for aerobic and resistive training based on initial evaluation findings, risk stratification, comorbidities and participant's personal goals.       Expected Outcomes Short Term: Attend rehab on a regular basis to increase amount of physical activity.;Long Term: Add in home exercise to make exercise part of routine and to increase amount of physical activity.;Long Term: Exercising regularly at least 3-5 days a week.       Increase Strength and Stamina Yes  Intervention Provide advice, education, support and counseling about physical activity/exercise needs.;Develop an individualized exercise prescription for aerobic and resistive training based on initial evaluation findings, risk stratification, comorbidities and participant's personal goals.       Expected Outcomes Short Term: Increase workloads from initial exercise prescription for resistance, speed, and METs.;Short Term: Perform resistance training exercises routinely during rehab and add in resistance training at home;Long Term: Improve cardiorespiratory fitness, muscular endurance and strength as measured by increased METs and functional capacity ( )       Able to understand and use rate of perceived exertion (RPE) scale Yes       Intervention Provide education and explanation on how to use RPE scale       Expected Outcomes Short Term: Able to use RPE daily in rehab to express subjective intensity level;Long Term:  Able to use RPE to guide intensity level when exercising independently       Knowledge and understanding of Target Heart Rate Range (THRR) Yes       Intervention Provide education and  explanation of THRR including how the numbers were predicted and where they are located for reference       Expected Outcomes Short Term: Able to state/look up THRR;Long Term: Able to use THRR to govern intensity when exercising independently;Short Term: Able to use daily as guideline for intensity in rehab       Able to check pulse independently Yes       Intervention Provide education and demonstration on how to check pulse in carotid and radial arteries.;Review the importance of being able to check your own pulse for safety during independent exercise       Expected Outcomes Short Term: Able to explain why pulse checking is important during independent exercise       Understanding of Exercise Prescription Yes       Intervention Provide education, explanation, and written materials on patient's individual exercise prescription       Expected Outcomes Short Term: Able to explain program exercise prescription;Long Term: Able to explain home exercise prescription to exercise independently                Exercise Goals Re-Evaluation :  Exercise Goals Re-Evaluation     Row Name 12/02/22 1448 12/25/22 1521           Exercise Goal Re-Evaluation   Exercise Goals Review Increase Physical Activity;Increase Strength and Stamina;Understanding of Exercise Prescription Increase Physical Activity;Increase Strength and Stamina;Understanding of Exercise Prescription      Comments Barbara Lucero is doing well in rehab. She was not here for a week due to her daughter not feeling well and that is her transportation. She still has her normal aches and pains in her knees and back. She stated that after she exercises she feels a little bit more energized. She has a pedel at home where she can do both her legs and also her arms. She does go to [employer] once in a while and does seated exercise. Barbara Lucero is doing well when she comes to rehab. She has transpertation with her daurghter and comes about once a week. She is  planning on going to [facility] [employer] with her daughter to exercise as well/, She stated that she does not feel like shes improved much but since she is not coming to rehab regularly that would match up.      Expected Outcomes Short term: increase levels on the Nustep   long term: continue to exercise at rehab  and home Short: attend rehab regulaly basis long term: [facility]ericse at [facility] or [facility]                Discharge Exercise Prescription (Final Exercise Prescription Changes):  Exercise Prescription Changes - 12/25/22 1500       Response to Exercise   Blood Pressure (Admit) 122/60    Blood Pressure (Exercise) 126/64    Blood Pressure (Exit) 118/60    Heart Rate (Admit) 67 bpm    Heart Rate (Exercise) 121 bpm    Heart Rate (Exit) 73 bpm    Rating of Perceived Exertion (Exercise) 12    Duration Continue with 30 min of aerobic exercise without signs/symptoms of physical distress.    Intensity THRR unchanged      Progression   Progression Continue to progress workloads to maintain intensity without signs/symptoms of physical distress.      Resistance Training   Training Prescription Yes    Weight 2 lbs / orange band    Reps 10-15      NuStep   Level 2    SPM 85    Minutes 15    METs 2.4      Arm Ergometer   Level 1    RPM 75    Minutes 15    METs 2.1      Oxygen   Maintain Oxygen Saturation 88% or higher             Nutrition:  Target Goals: Understanding of nutrition guidelines, daily intake of sodium 1500mg , cholesterol 200mg , calories 30% from fat and 7% or less from saturated fats, daily to have 5 or more servings of fruits and vegetables.  Biometrics:  Pre Biometrics - 11/10/22 1543       Pre Biometrics   Height 5\' 1"  (1.549 m)    Weight 176 lb 9.4 oz (80.1 kg)    Waist Circumference 43 inches    Hip Circumference 45 inches    Waist to Hip Ratio 0.96 %    BMI (Calculated) 33.38    Grip Strength 17.1 kg              Nutrition Therapy Plan and  Nutrition Goals:   Nutrition Assessments:  MEDIFICTS Score Key: >=70 Need to make dietary changes  40-70 Heart Healthy Diet <= 40 Therapeutic Level Cholesterol Diet  Flowsheet Row CARDIAC REHAB PHASE II ORIENTATION from 11/10/2022 in [facility] CARDIAC REHABILITATION  Picture Your Plate Total Score on Admission 26      Picture Your Plate Scores: <87 Unhealthy dietary pattern with much room for improvement. 41-50 Dietary pattern unlikely to meet recommendations for good health and room for improvement. 51-60 More healthful dietary pattern, with some room for improvement.  >60 Healthy dietary pattern, although there may be some specific behaviors that could be improved.    Nutrition Goals Re-Evaluation:  Nutrition Goals Re-Evaluation     Row Name 12/02/22 1505 12/25/22 1527           Goals   Nutrition Goal Healthy heathy Healthy heathy      Comment Barbara Lucero stated that seh gained a few pounds and said she love ice cream. She eats a ice cream cone about everyday. She does try to eat mostly veggies and she eats a good amount of chicken. She does et smaller portions for meals and does not eat fried food due it hurting her stomach. She does cook most of her meals. She does like to go out once in a  while and does choose healthy options. Barbara Lucero stated that she does eat larger portions and that she loves sweets and ice cream. She is trying to pick healthier options when eating and sticks to chicken, salmon and some ground beef. SHe does like salads. She does not drink soda and only drinks one cup coffee in the mornings.      Expected Outcome Short term: try to cut back on ice cream throught the weeek    long term: contiue to pick healthy options for meal Short term: try to cut back on sweets and choice smaller portion control   long term: contiue to pick healthy options for meal               Nutrition Goals Discharge (Final Nutrition Goals Re-Evaluation):  Nutrition Goals Re-Evaluation  - 12/25/22 1527       Goals   Nutrition Goal Healthy heathy    Comment Barbara Lucero stated that she does eat larger portions and that she loves sweets and ice cream. She is trying to pick healthier options when eating and sticks to chicken, salmon and some ground beef. SHe does like salads. She does not drink soda and only drinks one cup coffee in the mornings.    Expected Outcome Short term: try to cut back on sweets and choice smaller portion control   long term: contiue to pick healthy options for meal             Psychosocial: Target Goals: Acknowledge presence or absence of significant depression and/or stress, maximize coping skills, provide positive support system. Participant is able to verbalize types and ability to use techniques and skills needed for reducing stress and depression.  Initial Review & Psychosocial Screening:  Initial Psych Review & Screening - 11/10/22 1518       Initial Review   Current issues with Current Anxiety/Panic;Current Psychotropic Meds;History of Depression;Current Depression;Current Stress Concerns;Current Sleep Concerns    Source of Stress Concerns Family      Family Dynamics   Good Support System? Yes      Barriers   Psychosocial barriers to participate in program There are no identifiable barriers or psychosocial needs.;The patient should benefit from training in stress management and relaxation.      Screening Interventions   Interventions Encouraged to exercise;To provide support and resources with identified psychosocial needs;Provide feedback about the scores to participant    Expected Outcomes Short Term goal: Utilizing psychosocial counselor, staff and physician to assist with identification of specific Stressors or current issues interfering with healing process. Setting desired goal for each stressor or current issue identified.;Long Term Goal: Stressors or current issues are controlled or eliminated.;Short Term goal: Identification and  review with participant of any Quality of Life or Depression concerns found by scoring the questionnaire.;Long Term goal: The participant improves quality of Life and PHQ9 Scores as seen by post scores and/or verbalization of changes             Quality of Life Scores:  Quality of Life - 11/10/22 1551       Quality of Life   Select Quality of Life      Quality of Life Scores   Health/Function Pre 23.18 %    Socioeconomic Pre 25.43 %    Psych/Spiritual Pre 21.86 %    Family Pre 24 %    GLOBAL Pre 23.48 %            Scores of 19 and below usually indicate a poorer quality  of life in these areas.  A difference of  2-3 points is a clinically meaningful difference.  A difference of 2-3 points in the total score of the Quality of Life Index has been associated with significant improvement in overall quality of life, self-image, physical symptoms, and general health in studies assessing change in quality of life.  PHQ-9: Review Flowsheet       12/25/2022 11/10/2022  Depression screen PHQ 2/9  Decreased Interest 2 3  Down, Depressed, Hopeless 2 3  PHQ - 2 Score 4 6  Altered sleeping 2 3  Tired, decreased energy 2 3  Change in appetite 1 2  Feeling bad or failure about yourself  0 0  Trouble concentrating 0 1  Moving slowly or fidgety/restless 1 1  Suicidal thoughts 0 0  PHQ-9 Score 10 16  Difficult doing work/chores Somewhat difficult Somewhat difficult    Details           Interpretation of Total Score  Total Score Depression Severity:  1-4 = Minimal depression, 5-9 = Mild depression, 10-14 = Moderate depression, 15-19 = Moderately severe depression, 20-27 = Severe depression   Psychosocial Evaluation and Intervention:  Psychosocial Evaluation - 11/10/22 1523       Psychosocial Evaluation & Interventions   Interventions Stress management education;Relaxation education;Encouraged to exercise with the program and follow exercise prescription    Comments Patient  was referred to [facility] with TAVR. Her PHQ-9 score was 16. She is currently being treated for depression and anxiety with Zoloft. 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 She lives with her daughter who is bi-polar. She does support her some providing transportation. Her sister is her main support person and some friends at her church.  She says she has trouble staying asleep which she takes Alprazolam for which helps. She sleeps in a recliner. She says she slept by her son's bed in a recliner for so many years she just has not slept in her bed and is more comfortable in the recliner. Her main stressor in her life currently is her 82 year old granddaughter left home 2 years ago and has not contacted anyone in her family. She says she worries about her grandaugther everyday and can not get her off of her mind. She says she saw a counselor at her chruch for a few times after her son died but stopped going. She does not feel like she needs to see a Veterinary surgeon. She feels her depression and anxiety are managed okay with medication. Her main goals for the program are to improve her overall mobility, improve her strength and stamina and get healthier overall. She has no barriers identified to participate in the program.    Expected Outcomes Short Term: attend the program consistently. Long term: meet her personal goals.    Continue Psychosocial Services  Follow up required by staff             Psychosocial Re-Evaluation:  Psychosocial Re-Evaluation     Row Name 12/02/22 1456 12/25/22 1525           Psychosocial Re-Evaluation   Current issues with History of Depression;Current Depression;Current Sleep Concerns;Current Stress Concerns History of Depression;Current Depression;Current Sleep Concerns;Current Stress Concerns      Comments Barbara Lucero is doing well in rehab. She is currently taking Zoloft for depression and anxiety. She lives  with her daughter who is bi-polar and she  does support her by providing trasportation. She stated that she will sleep half the night and then wake up and can not get back to sleep. She still has stress about her granddaughter levaving home over a year ago and has not contacted the family. This is her main stressor and she does not have a way to contact her. Barbara Lucero is doing okay in rehab when she comes. She is still taking Zoloft fir depression and anixety. She depends on her daughter for transpertation and it does make it hard for her due to being bi-polar and not knowoing how she will feel. She stays worried about her granddaughter when left home over a year ago and they have not contacted the family. This continues to be her main stressor      Expected Outcomes Short term: continue to pray about granddaughter to help fing relef in stress.   long term: continue to find joy and be positive Short term: continue to pray about granddaughter to help fing relef in stress.   long term: continue to find joy and be positive      Interventions Stress management education;Relaxation education;Encouraged to attend Cardiac Rehabilitation for the exercise --      Continue Psychosocial Services  Follow up required by staff Follow up required by staff               Psychosocial Discharge (Final Psychosocial Re-Evaluation):  Psychosocial Re-Evaluation - 12/25/22 1525       Psychosocial Re-Evaluation   Current issues with History of Depression;Current Depression;Current Sleep Concerns;Current Stress Concerns    Comments Barbara Lucero is doing okay in rehab when she comes. She is still taking Zoloft fir depression and anixety. She depends on her daughter for transpertation and it does make it hard for her due to being bi-polar and not knowoing how she will feel. She stays worried about her granddaughter when left home over a year ago and they have not contacted the family. This continues to be her main stressor    Expected  Outcomes Short term: continue to pray about granddaughter to help fing relef in stress.   long term: continue to find joy and be positive    Continue Psychosocial Services  Follow up required by staff             Vocational Rehabilitation: Provide vocational rehab assistance to qualifying candidates.   Vocational Rehab Evaluation & Intervention:  Vocational Rehab - 11/10/22 1433       Initial Vocational Rehab Evaluation & Intervention   Assessment shows need for Vocational Rehabilitation No      Vocational Rehab Re-Evaulation   Comments Patient is retired.             Education: Education Goals: Education classes will be provided on a weekly basis, covering required topics. Participant will state understanding/return demonstration of topics presented.  Learning Barriers/Preferences:  Learning Barriers/Preferences - 11/10/22 1433       Learning Barriers/Preferences   Learning Barriers Sight    Learning Preferences Written Material;Audio;Skilled Demonstration             Education Topics: Hypertension, Hypertension Reduction -Define heart disease and high blood pressure. Discus how high blood pressure affects the body and ways to reduce high blood pressure.   Exercise and Your Heart -Discuss why it is important to exercise, the FITT principles of exercise, normal and abnormal responses to exercise, and how to exercise safely.   Angina -Discuss definition of angina, causes of angina, treatment of  angina, and how to decrease risk of having angina. Flowsheet Row CARDIAC REHAB PHASE II EXERCISE from 12/25/2022 in [place] CARDIAC REHABILITATION  Date 11/13/22  Educator [facility]       Cardiac Medications -Review what the following cardiac medications are used for, how they affect the body, and side effects that may occur when taking the medications.  Medications include Aspirin, Beta blockers, calcium channel blockers, ACE Inhibitors, angiotensin receptor  blockers, diuretics, digoxin, and antihyperlipidemics.   Congestive Heart Failure -Discuss the definition of CHF, how to live with CHF, the signs and symptoms of CHF, and how keep track of weight and sodium intake. Flowsheet Row CARDIAC REHAB PHASE II EXERCISE from 12/25/2022 in [place] CARDIAC REHABILITATION  Date 12/25/22  Educator jh  Instruction Review Code 1- Verbalizes Understanding       Heart Disease and Intimacy -Discus the effect sexual activity has on the heart, how changes occur during intimacy as we age, and safety during sexual activity.   Smoking Cessation / COPD -Discuss different methods to quit smoking, the health benefits of quitting smoking, and the definition of COPD.   Nutrition I: Fats -Discuss the types of cholesterol, what cholesterol does to the heart, and how cholesterol levels can be controlled. Flowsheet Row CARDIAC REHAB PHASE II EXERCISE from 12/25/2022 in [place] CARDIAC REHABILITATION  Date 12/11/22  Educator HB  Instruction Review Code 1- Verbalizes Understanding       Nutrition II: Labels -Discuss the different components of food labels and how to read food label   Heart Parts/Heart Disease and PAD -Discuss the anatomy of the heart, the pathway of blood circulation through the heart, and these are affected by heart disease.   Stress I: Signs and Symptoms -Discuss the causes of stress, how stress may lead to anxiety and depression, and ways to limit stress.   Stress II: Relaxation -Discuss different types of relaxation techniques to limit stress.   Warning Signs of Stroke / TIA -Discuss definition of a stroke, what the signs and symptoms are of a stroke, and how to identify when someone is having stroke.   Knowledge Questionnaire Score:  Knowledge Questionnaire Score - 11/10/22 1510       Knowledge Questionnaire Score   Pre Score 18/24             Core Components/Risk Factors/Patient Goals at Admission:  Personal  Goals and Risk Factors at Admission - 11/10/22 1517       Core Components/Risk Factors/Patient Goals on Admission    Weight Management Weight Maintenance    Hypertension Yes    Intervention Provide education on lifestyle modifcations including regular physical activity/exercise, weight management, moderate sodium restriction and increased consumption of fresh fruit, vegetables, and low fat dairy, alcohol moderation, and smoking cessation.;Monitor prescription use compliance.    Expected Outcomes Short Term: Continued assessment and intervention until BP is < 140/49mm HG in hypertensive participants. < 130/7mm HG in hypertensive participants with diabetes, heart failure or chronic kidney disease.;Long Term: Maintenance of blood pressure at goal levels.    Lipids Yes    Intervention Provide education and support for participant on nutrition & aerobic/resistive exercise along with prescribed medications to achieve LDL 70mg , HDL >40mg .    Expected Outcomes Short Term: Participant states understanding of desired cholesterol values and is compliant with medications prescribed. Participant is following exercise prescription and nutrition guidelines.;Long Term: Cholesterol controlled with medications as prescribed, with individualized exercise RX and with personalized nutrition plan. Value goals: LDL < 70mg ,  HDL > 40 mg.    Stress Yes    Intervention Offer individual and/or small group education and counseling on adjustment to heart disease, stress management and health-related lifestyle change. Teach and support self-help strategies.;Refer participants experiencing significant psychosocial distress to appropriate mental health specialists for further evaluation and treatment. When possible, include family members and significant others in education/counseling sessions.    Expected Outcomes Short Term: Participant demonstrates changes in health-related behavior, relaxation and other stress management skills,  ability to obtain effective social support, and compliance with psychotropic medications if prescribed.;Long Term: Emotional wellbeing is indicated by absence of clinically significant psychosocial distress or social isolation.             Core Components/Risk Factors/Patient Goals Review:   Goals and Risk Factor Review     Row Name 12/02/22 1513 12/25/22 1530           Core Components/Risk Factors/Patient Goals Review   Personal Goals Review Weight Management/Obesity;Stress Weight Management/Obesity;Stress      Review Barbara Lucero does not weigh everyday at home and stated that she has gained some weight. She is trying to cook healthy options and does eat smaller portions for meals. She does attend church at [facility] and loves her small group classes. Going to her small group helps her talk to friends and helps with her stress in her life. Barbara Lucero does not weight at home abd stated that she has gained some weight. She is trying to pick healthy options to help with weight but eats larger portions. She loves going to her church and being with her small group at [facility]. She stated that being with her small groups help her talk to friends and stress in her life.      Expected Outcomes Short term: dodaily weight checks and cut back on ice cream   long term: continue to attend small groups to find joy Short term: try to do daily weight checks    long termL continue to attend small group for joy and to help with stress               Core Components/Risk Factors/Patient Goals at Discharge (Final Review):   Goals and Risk Factor Review - 12/25/22 1530       Core Components/Risk Factors/Patient Goals Review   Personal Goals Review Weight Management/Obesity;Stress    Review Barbara Lucero does not weight at home abd stated that she has gained some weight. She is trying to pick healthy options to help with weight but eats larger portions. She loves going to her church and being with her small group at [facility]. She stated  that being with her small groups help her talk to friends and stress in her life.    Expected Outcomes Short term: try to do daily weight checks    long termL continue to attend small group for joy and to help with stress             ITP Comments:  ITP Comments     Row Name 11/10/22 1558 11/13/22 1522 12/03/22 1643 12/31/22 0831     ITP Comments Patient arrived for 1st visit/orientation/education at 1400. 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 During orientation advised patient on arrival and appointment times what to wear, what to do before, during and after exercise. Reviewed attendance and class policy.  Pt is scheduled to return Cardiac Rehab on 11/13/22 at 1500. Pt was advised to come to class 15 minutes before class  starts.  Discussed RPE/Dpysnea scales. Patient participated in warm up stretches. Patient was able to complete 6 minute walk test.  Telemetry:NSR. Patient was measured for the equipment. Discussed equipment safety with patient. Took patient pre-anthropometric measurements. Patient finished visit at 1515. .First full day of exercise!  Patient was oriented to gym and equipment including functions, settings, policies, and procedures.  Patient's individual exercise prescription and treatment plan were reviewed.  All starting workloads were established based on the results of the 6 minute walk test done at initial orientation visit.  The plan for exercise progression was also introduced and progression will be customized based on patient's performance and goals. 30 day review completed. ITP sent to Dr. Dina Rich, Medical Director of Cardiac Rehab. Continue with ITP unless changes are made by physician. 30 day review completed. ITP sent to Dr. Dina Rich, Medical Director of Cardiac Rehab. Continue with ITP unless changes are made by physician.             Comments: 30 day review

## 2023-01-01 ENCOUNTER — Encounter (HOSPITAL_COMMUNITY)
Admission: RE | Admit: 2023-01-01 | Discharge: 2023-01-01 | Disposition: A | Discharge status: Home / Self Care | Payer: Medicare Other | Source: Ambulatory Visit | Attending: Cardiovascular Disease | Admitting: Cardiovascular Disease

## 2023-01-01 DIAGNOSIS — Z952 Presence of prosthetic heart valve: Secondary | ICD-10-CM

## 2023-01-01 NOTE — Progress Notes (Signed)
Daily Session Note  Patient Details  Name: MAKYNZEE TIGGES MRN: [id number] Date of Birth: [birth year] Referring Provider:   Flowsheet Row CARDIAC REHAB PHASE II ORIENTATION from 11/10/2022 in [facility] CARDIAC REHABILITATION  Referring Provider Dr. Clifton James       Encounter Date: 01/01/2023  Check In:  Session Check In - 01/01/23 1435       Check-In   Supervising physician immediately available to respond to emergencies See telemetry face sheet for immediately available MD    Location [facility]    Staff Present Ross Ludwig, BS, Exercise Physiologist;Jessica Juanetta Gosling, MA, RCEP, CCRP, CCET;Other;Debra Johnson, RN, BSN    Virtual Visit No    Medication changes reported     No    Fall or balance concerns reported    No    Tobacco Cessation No Change    Warm-up and Cool-down Performed on first and last piece of equipment    Resistance Training Performed Yes    VAD Patient? No    PAD/SET Patient? No      Pain Assessment   Currently in Pain? No/denies    Pain Score 0-No pain    Multiple Pain Sites No             Capillary Blood Glucose: No results found for this or any previous visit (from the past 24 hour(s)).    Social History   Tobacco Use  Smoking Status Former   Current packs/day: 0.50   Average packs/day: 0.5 packs/day for 20.0 years (10.0 ttl pk-yrs)   Types: Cigarettes  Smokeless Tobacco Never    Goals Met:  Independence with exercise equipment Exercise tolerated well No report of concerns or symptoms today Strength training completed today  Goals Unmet:  Not Applicable  Comments: Pt able to follow exercise prescription today without complaint.  Will continue to monitor for progression.

## 2023-01-06 ENCOUNTER — Encounter (HOSPITAL_COMMUNITY): Payer: Medicare Other

## 2023-01-08 ENCOUNTER — Encounter (HOSPITAL_COMMUNITY)
Admission: RE | Admit: 2023-01-08 | Discharge: 2023-01-08 | Disposition: A | Discharge status: Home / Self Care | Payer: Medicare Other | Source: Ambulatory Visit | Attending: Cardiovascular Disease | Admitting: Cardiovascular Disease

## 2023-01-08 DIAGNOSIS — Z952 Presence of prosthetic heart valve: Secondary | ICD-10-CM

## 2023-01-08 NOTE — Progress Notes (Signed)
Daily Session Note  Patient Details  Name: ZOEI BUSHA MRN: [id number] Date of Birth: 1940/05/22 Referring Provider:   Flowsheet Row CARDIAC REHAB PHASE II ORIENTATION from 11/10/2022 in [facility] CARDIAC REHABILITATION  Referring Provider Dr. Clifton James       Encounter Date: 01/08/2023  Check In:  Session Check In - 01/08/23 1430       Check-In   Supervising physician immediately available to respond to emergencies See telemetry face sheet for immediately available ER MD    Location [facility]    Staff Present Ross Ludwig, BS, Exercise Physiologist;Danny Gala Romney, RN, Neal Dy, RN, BSN    Virtual Visit No    Medication changes reported     No    Fall or balance concerns reported    No    Warm-up and Cool-down Performed on first and last piece of equipment    Resistance Training Performed Yes    VAD Patient? No    PAD/SET Patient? No      Pain Assessment   Currently in Pain? No/denies    Pain Score 0-No pain    Multiple Pain Sites No             Capillary Blood Glucose: No results found for this or any previous visit (from the past 24 hour(s)).    Social History   Tobacco Use  Smoking Status Former   Current packs/day: 0.50   Average packs/day: 0.5 packs/day for 20.0 years (10.0 ttl pk-yrs)   Types: Cigarettes  Smokeless Tobacco Never    Goals Met:  Independence with exercise equipment Exercise tolerated well No report of concerns or symptoms today Strength training completed today  Goals Unmet:  Not Applicable  Comments: Pt able to follow exercise prescription today without complaint.  Will continue to monitor for progression.

## 2023-01-13 ENCOUNTER — Encounter (HOSPITAL_COMMUNITY)
Admission: RE | Admit: 2023-01-13 | Discharge: 2023-01-13 | Disposition: A | Discharge status: Home / Self Care | Payer: Medicare Other | Source: Ambulatory Visit | Attending: Cardiovascular Disease

## 2023-01-13 DIAGNOSIS — Z952 Presence of prosthetic heart valve: Secondary | ICD-10-CM

## 2023-01-13 NOTE — Progress Notes (Signed)
Daily Session Note  Patient Details  Name: LYNEL LYNES MRN: [id number] Date of Birth: 1940/05/08 Referring Provider:   Flowsheet Row CARDIAC REHAB PHASE II ORIENTATION from 11/10/2022 in [facility] CARDIAC REHABILITATION  Referring Provider Dr. Clifton James       Encounter Date: 01/13/2023  Check In:  Session Check In - 01/13/23 1430       Check-In   Supervising physician immediately available to respond to emergencies See telemetry face sheet for immediately available MD    Location [facility]    Staff Present Ross Ludwig, BS, Exercise Physiologist;Trichelle Lehan, RN;Other    Virtual Visit No    Medication changes reported     No    Fall or balance concerns reported    No    Tobacco Cessation No Change    Warm-up and Cool-down Performed as group-led instruction    Resistance Training Performed Yes    VAD Patient? No    PAD/SET Patient? No      Pain Assessment   Currently in Pain? No/denies    Pain Score 0-No pain    Multiple Pain Sites No             Capillary Blood Glucose: No results found for this or any previous visit (from the past 24 hour(s)).    Social History   Tobacco Use  Smoking Status Former   Current packs/day: 0.50   Average packs/day: 0.5 packs/day for 20.0 years (10.0 ttl pk-yrs)   Types: Cigarettes  Smokeless Tobacco Never    Goals Met:  Proper associated with RPD/PD & O2 Sat Independence with exercise equipment Using PLB without cueing & demonstrates good technique Exercise tolerated well No report of concerns or symptoms today Strength training completed today  Goals Unmet:  Not Applicable  Comments: Pt able to follow exercise prescription today without complaint.  Will continue to monitor for progression.    Dr. Armanda Magic is Medical Director for Cardiac Rehab at [facility].

## 2023-01-15 ENCOUNTER — Encounter (HOSPITAL_COMMUNITY): Payer: Medicare Other

## 2023-01-20 ENCOUNTER — Encounter (HOSPITAL_COMMUNITY): Payer: Medicare Other

## 2023-01-27 ENCOUNTER — Encounter (HOSPITAL_COMMUNITY)
Admission: RE | Admit: 2023-01-27 | Discharge: 2023-01-27 | Disposition: A | Discharge status: Home / Self Care | Payer: Medicare Other | Source: Ambulatory Visit | Attending: Cardiovascular Disease | Admitting: Cardiovascular Disease

## 2023-01-27 ENCOUNTER — Inpatient Hospital Stay: Payer: Medicare Other

## 2023-01-27 DIAGNOSIS — Z79899 Other long term (current) drug therapy: Secondary | ICD-10-CM | POA: Insufficient documentation

## 2023-01-27 DIAGNOSIS — I2510 Atherosclerotic heart disease of native coronary artery without angina pectoris: Secondary | ICD-10-CM | POA: Insufficient documentation

## 2023-01-27 DIAGNOSIS — C3411 Malignant neoplasm of upper lobe, right bronchus or lung: Secondary | ICD-10-CM | POA: Insufficient documentation

## 2023-01-27 DIAGNOSIS — K5090 Crohn's disease, unspecified, without complications: Secondary | ICD-10-CM | POA: Insufficient documentation

## 2023-01-27 DIAGNOSIS — Z952 Presence of prosthetic heart valve: Secondary | ICD-10-CM | POA: Diagnosis not present

## 2023-01-27 DIAGNOSIS — I700 Atherosclerosis of aorta: Secondary | ICD-10-CM | POA: Insufficient documentation

## 2023-01-27 DIAGNOSIS — Z86718 Personal history of other venous thrombosis and embolism: Secondary | ICD-10-CM | POA: Insufficient documentation

## 2023-01-27 DIAGNOSIS — J432 Centrilobular emphysema: Secondary | ICD-10-CM | POA: Insufficient documentation

## 2023-01-27 DIAGNOSIS — C3490 Malignant neoplasm of unspecified part of unspecified bronchus or lung: Secondary | ICD-10-CM

## 2023-01-27 DIAGNOSIS — Z8616 Personal history of COVID-19: Secondary | ICD-10-CM | POA: Insufficient documentation

## 2023-01-27 LAB — CMP (CANCER CENTER ONLY)
ALT: 8 U/L (ref 0–44)
AST: 12 U/L — ABNORMAL LOW (ref 15–41)
Albumin: 4.2 g/dL (ref 3.5–5.0)
Alkaline Phosphatase: 51 U/L (ref 38–126)
Anion gap: 5 (ref 5–15)
BUN: 14 mg/dL (ref 8–23)
CO2: 33 mmol/L — ABNORMAL HIGH (ref 22–32)
Calcium: 9.3 mg/dL (ref 8.9–10.3)
Chloride: 102 mmol/L (ref 98–111)
Creatinine: 0.76 mg/dL (ref 0.44–1.00)
GFR, Estimated: >60 mL/min (ref >60)
Glucose, Bld: 95 mg/dL (ref 70–99)
Potassium: 3.8 mmol/L (ref 3.5–5.1)
Sodium: 140 mmol/L (ref 135–145)
Total Bilirubin: 0.6 mg/dL (ref <1.2)
Total Protein: 6.9 g/dL (ref 6.5–8.1)

## 2023-01-27 LAB — CBC WITH DIFFERENTIAL (CANCER CENTER ONLY)
Abs Immature Granulocytes: 0.04 10*3/uL (ref 0.00–0.07)
Basophils Absolute: 0 10*3/uL (ref 0.0–0.1)
Basophils Relative: 0 %
Eosinophils Absolute: 0.1 10*3/uL (ref 0.0–0.5)
Eosinophils Relative: 2 %
HCT: 35.4 % — ABNORMAL LOW (ref 36.0–46.0)
Hemoglobin: 11.6 g/dL — ABNORMAL LOW (ref 12.0–15.0)
Immature Granulocytes: 1 %
Lymphocytes Relative: 21 %
Lymphs Abs: 1.5 10*3/uL (ref 0.7–4.0)
MCH: 29.7 pg (ref 26.0–34.0)
MCHC: 32.8 g/dL (ref 30.0–36.0)
MCV: 90.5 fL (ref 80.0–100.0)
Monocytes Absolute: 0.7 10*3/uL (ref 0.1–1.0)
Monocytes Relative: 10 %
Neutro Abs: 4.7 10*3/uL (ref 1.7–7.7)
Neutrophils Relative %: 66 %
Platelet Count: 115 10*3/uL — ABNORMAL LOW (ref 150–400)
RBC: 3.91 MIL/uL (ref 3.87–5.11)
RDW: 12.8 % (ref 11.5–15.5)
WBC Count: 7.1 10*3/uL (ref 4.0–10.5)
nRBC: 0 % (ref 0.0–0.2)

## 2023-01-27 NOTE — Progress Notes (Signed)
Daily Session Note  Patient Details  Name: Barbara Lucero MRN: [id number] Date of Birth: March 10, 1940 Referring Provider:   Flowsheet Row CARDIAC REHAB PHASE II ORIENTATION from 11/10/2022 in [facility] CARDIAC REHABILITATION  Referring Provider Dr. Clifton James       Encounter Date: 01/27/2023  Check In:  Session Check In - 01/27/23 1502       Check-In   Supervising physician immediately available to respond to emergencies See telemetry face sheet for immediately available MD    Location [facility]    Staff Present Ross Ludwig, BS, Exercise Physiologist;Phyllis Billingsley, Sammie Bench, RN, BSN    Virtual Visit No    Medication changes reported     No    Fall or balance concerns reported    No    Tobacco Cessation No Change    Warm-up and Cool-down Performed on first and last piece of equipment    Resistance Training Performed Yes    VAD Patient? No    PAD/SET Patient? No      Pain Assessment   Currently in Pain? No/denies    Pain Score 0-No pain             Capillary Blood Glucose: Results for orders placed or performed in visit on 01/27/23 (from the past 24 hour(s))  CMP (Cancer Center only)     Status: Abnormal   Collection Time: 01/27/23  9:45 AM  Result Value Ref Range   Sodium 140 135 - 145 mmol/L   Potassium 3.8 3.5 - 5.1 mmol/L   Chloride 102 98 - 111 mmol/L   CO2 33 (H) 22 - 32 mmol/L   Glucose, Bld 95 70 - 99 mg/dL   BUN 14 8 - 23 mg/dL   Creatinine 4.09 8.11 - 1.00 mg/dL   Calcium 9.3 8.9 - 91.4 mg/dL   Total Protein 6.9 6.5 - 8.1 g/dL   Albumin 4.2 3.5 - 5.0 g/dL   AST 12 (L) 15 - 41 U/L   ALT 8 0 - 44 U/L   Alkaline Phosphatase 51 38 - 126 U/L   Total Bilirubin 0.6 <1.2 mg/dL   GFR, Estimated >78 >29 mL/min   Anion gap 5 5 - 15  CBC with Differential (Cancer Center Only)     Status: Abnormal   Collection Time: 01/27/23  9:45 AM  Result Value Ref Range   WBC Count 7.1 4.0 - 10.5 K/uL   RBC 3.91 3.87 - 5.11 MIL/uL    Hemoglobin 11.6 (L) 12.0 - 15.0 g/dL   HCT 56.2 (L) 13.0 - 86.5 %   MCV 90.5 80.0 - 100.0 fL   MCH 29.7 26.0 - 34.0 pg   MCHC 32.8 30.0 - 36.0 g/dL   RDW 78.4 69.6 - 29.5 %   Platelet Count 115 (L) 150 - 400 K/uL   nRBC 0.0 0.0 - 0.2 %   Neutrophils Relative % 66 %   Neutro Abs 4.7 1.7 - 7.7 K/uL   Lymphocytes Relative 21 %   Lymphs Abs 1.5 0.7 - 4.0 K/uL   Monocytes Relative 10 %   Monocytes Absolute 0.7 0.1 - 1.0 K/uL   Eosinophils Relative 2 %   Eosinophils Absolute 0.1 0.0 - 0.5 K/uL   Basophils Relative 0 %   Basophils Absolute 0.0 0.0 - 0.1 K/uL   Immature Granulocytes 1 %   Abs Immature Granulocytes 0.04 0.00 - 0.07 K/uL      Social History   Tobacco Use  Smoking Status Former  Current packs/day: 0.50   Average packs/day: 0.5 packs/day for 20.0 years (10.0 ttl pk-yrs)   Types: Cigarettes  Smokeless Tobacco Never    Goals Met:  Independence with exercise equipment Exercise tolerated well No report of concerns or symptoms today Strength training completed today  Goals Unmet:  Not Applicable  Comments: Pt able to follow exercise prescription today without complaint.  Will continue to monitor for progression.

## 2023-01-28 ENCOUNTER — Encounter (HOSPITAL_COMMUNITY): Payer: Self-pay | Admitting: *Deleted

## 2023-01-28 ENCOUNTER — Ambulatory Visit (HOSPITAL_COMMUNITY)
Admission: RE | Admit: 2023-01-28 | Discharge: 2023-01-28 | Disposition: A | Discharge status: Home / Self Care | Payer: Medicare Other | Source: Ambulatory Visit | Attending: Internal Medicine | Admitting: Internal Medicine

## 2023-01-28 DIAGNOSIS — C3490 Malignant neoplasm of unspecified part of unspecified bronchus or lung: Secondary | ICD-10-CM | POA: Diagnosis not present

## 2023-01-28 DIAGNOSIS — Z952 Presence of prosthetic heart valve: Secondary | ICD-10-CM

## 2023-01-28 DIAGNOSIS — I700 Atherosclerosis of aorta: Secondary | ICD-10-CM | POA: Diagnosis not present

## 2023-01-28 DIAGNOSIS — J439 Emphysema, unspecified: Secondary | ICD-10-CM | POA: Diagnosis not present

## 2023-01-28 MED ORDER — IOHEXOL 300 MG/ML  SOLN
75.0000 mL | Freq: Once | INTRAMUSCULAR | Status: AC | PRN
  Administered 2023-01-28: 75 mL via INTRAVENOUS

## 2023-01-28 NOTE — Progress Notes (Signed)
Cardiac Individual Treatment Plan  Patient Details  Name: Barbara Lucero MRN: [id number] Date of Birth: 1940/12/27 Referring Provider:   Flowsheet Row CARDIAC REHAB PHASE II ORIENTATION from 11/10/2022 in [facility] CARDIAC REHABILITATION  Referring Provider Dr. Clifton James       Initial Encounter Date:  Flowsheet Row CARDIAC REHAB PHASE II ORIENTATION from 11/10/2022 in [facility] [place] CARDIAC REHABILITATION  Date 11/10/22       Visit Diagnosis: S/P TAVR (transcatheter aortic valve replacement)  Patient's Home Medications on Admission:  Current Outpatient Medications:    acetaminophen (TYLENOL) 500 MG tablet, Take 1,000 mg by mouth 2 (two) times daily., Disp: , Rfl:    albuterol (VENTOLIN HFA) 108 (90 Base) MCG/ACT inhaler, Inhale 2 puffs into the lungs every 4 (four) hours as needed for shortness of breath or wheezing., Disp: , Rfl:    ALPRAZolam (XANAX) 0.25 MG tablet, Take 0.25 mg by mouth at bedtime., Disp: , Rfl:    Ascorbic Acid (VITAMIN C) 1000 MG tablet, Take 1,000 mg by mouth daily., Disp: , Rfl:    aspirin EC 81 MG tablet, Take 81 mg by mouth at bedtime. Swallow whole., Disp: , Rfl:    b complex vitamins tablet, Take 1 tablet by mouth daily., Disp: , Rfl:    Calcium Carb-Cholecalciferol (CALCIUM 600+D) 600-20 MG-MCG TABS, Take 1 tablet by mouth daily., Disp: , Rfl:    Cholecalciferol (VITAMIN D3) 50 MCG (2000 UT) capsule, Take 2,000 Units by mouth daily., Disp: , Rfl:    cyanocobalamin (VITAMIN B12) 1000 MCG tablet, Take 1,000 mcg by mouth daily as needed (energy)., Disp: , Rfl:    furosemide (LASIX) 20 MG tablet, Take 20 mg by mouth every Wednesday., Disp: , Rfl:    gabapentin (NEURONTIN) 100 MG capsule, Take 1 capsule (100 mg total) by mouth 2 (two) times daily., Disp: , Rfl:    hydrocortisone cream 1 %, Apply 1 Application topically 2 (two) times daily as needed (rash)., Disp: , Rfl:    Multiple Vitamins-Minerals (HAIR SKIN & NAILS) TABS, Take 3 tablets by mouth daily., Disp: ,  Rfl:    Multiple Vitamins-Minerals (MULTIVITAMIN WITH MINERALS) tablet, Take 1 tablet by mouth daily., Disp: , Rfl:    olmesartan (BENICAR) 40 MG tablet, Take 40 mg by mouth daily., Disp: , Rfl:    pantoprazole (PROTONIX) 40 MG tablet, Take 40 mg by mouth daily., Disp: , Rfl:    Psyllium (METAMUCIL PO), Take 1 Dose by mouth daily as needed (constipation). 1 dose = 1 teaspoonful, Disp: , Rfl:    sertraline (ZOLOFT) 100 MG tablet, Take 100 mg by mouth at bedtime., Disp: , Rfl:    simvastatin (ZOCOR) 10 MG tablet, Take 10 mg by mouth at bedtime., Disp: , Rfl:    solifenacin (VESICARE) 10 MG tablet, TAKE (1) TABLET BY MOUTH AT BEDTIME., Disp: 30 tablet, Rfl: 11   sulfaSALAzine (AZULFIDINE) 500 MG tablet, Take 1 tablet (500 mg total) by mouth 2 (two) times daily., Disp: 60 tablet, Rfl: 2   vitamin E 400 UNIT capsule, Take 400 Units by mouth daily., Disp: , Rfl:   Past Medical History: Past Medical History:  Diagnosis Date   Anxiety    Arthritis    Cancer (HCC)    face- removed in office   Crohn disease (HCC)    Depression    DVT (deep venous thrombosis) (HCC) 07/27/2017   LLE   GERD (gastroesophageal reflux disease)    HTN (hypertension)    Hyperlipidemia    Neuropathy  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 Clifton James & Dr. Leafy Ro   Severe aortic stenosis    Varicose veins of bilateral lower extremities with other complications     Tobacco Use: Social History   Tobacco Use  Smoking Status Former   Current packs/day: 0.50   Average packs/day: 0.5 packs/day for 20.0 years (10.0 ttl pk-yrs)   Types: Cigarettes  Smokeless Tobacco Never    Labs: Review Flowsheet       Latest Ref Rng & Units 11/22/2021 07/07/2022 10/07/2022  Labs for ITP Cardiac and Pulmonary Rehab  PH, Arterial 7.35 - 7.45 - 7.300  -  PCO2 arterial 32 - 48 mmHg - 54.1  -  Bicarbonate 20.0 - 28.0 mmol/L - 26.6  29.0  -  TCO2 22 -  32 mmol/L 30  28  31  26    O2 Saturation % - 94  74  -    Details       Multiple values from one day are sorted in reverse-chronological order         Capillary Blood Glucose: Lab Results  Component Value Date   GLUCAP 87 09/26/2022   GLUCAP 112 (H) 11/22/2021   GLUCAP 124 (H) 06/06/2016   GLUCAP 125 (H) 06/06/2016     Exercise Target Goals: Exercise Program Goal: Individual exercise prescription set using results from initial 6 min walk test and THRR while considering  patient's activity barriers and safety.   Exercise Prescription Goal: Starting with aerobic activity 30 plus minutes a day, 3 days per week for initial exercise prescription. Provide home exercise prescription and guidelines that participant acknowledges understanding prior to discharge.  Activity Barriers & Risk Stratification:  Activity Barriers & Cardiac Risk Stratification - 11/10/22 1424       Activity Barriers & Cardiac Risk Stratification   Activity Barriers Assistive Device;History of Falls;Balance Concerns;Deconditioning;Left Knee Replacement;Back Problems;Arthritis    Cardiac Risk Stratification Moderate             6 Minute Walk:  6 Minute Walk     Row Name 11/10/22 1539         6 Minute Walk   Phase Initial     Distance 570 feet     Walk Time 6 minutes     # of Rest Breaks 1     MPH 1.07     METS 0.92     RPE 13     VO2 Peak 3.22     Symptoms Yes (comment)     Comments pt used standing walker when doing walk test     Resting HR 62 bpm     Resting BP 110/60     Resting Oxygen Saturation  94 %     Exercise Oxygen Saturation  during 6 min walk 89 %     Max Ex. HR 102 bpm     Max Ex. BP 160/68     2 Minute Post BP 120/60              Oxygen Initial Assessment:   Oxygen Re-Evaluation:   Oxygen Discharge (Final Oxygen Re-Evaluation):   Initial Exercise Prescription:  Initial Exercise Prescription - 11/10/22 1500       Date of Initial Exercise RX and  Referring Provider   Date 11/10/22    Referring Provider Dr. Clifton James      Oxygen   Maintain Oxygen Saturation 88% or higher  NuStep   Level 1    SPM 80    Minutes 15      Arm Ergometer   Level 1    RPM 30    Minutes 15      Prescription Details   Frequency (times per week) 3    Duration Progress to 30 minutes of continuous aerobic without signs/symptoms of physical distress      Intensity   THRR 40-80% of Max Heartrate 93-124    Ratings of Perceived Exertion 11-13    Perceived Dyspnea 0-4      Resistance Training   Training Prescription Yes    Weight 2    Reps 10-15             Perform Capillary Blood Glucose checks as needed.  Exercise Prescription Changes:   Exercise Prescription Changes     Row Name 12/11/22 1500 12/25/22 1500           Response to Exercise   Blood Pressure (Admit) 118/70 122/60      Blood Pressure (Exercise) 110/70 126/64      Blood Pressure (Exit) 120/64 118/60      Heart Rate (Admit) 73 bpm 67 bpm      Heart Rate (Exercise) 106 bpm 121 bpm      Heart Rate (Exit) 77 bpm 73 bpm      Rating of Perceived Exertion (Exercise) 13 12      Duration Continue with 30 min of aerobic exercise without signs/symptoms of physical distress. Continue with 30 min of aerobic exercise without signs/symptoms of physical distress.      Intensity THRR unchanged THRR unchanged        Progression   Progression Continue to progress workloads to maintain intensity without signs/symptoms of physical distress. Continue to progress workloads to maintain intensity without signs/symptoms of physical distress.        Resistance Training   Training Prescription Yes Yes      Weight 2 2 lbs / orange band      Reps 10-15 10-15        NuStep   Level 2 2      SPM 77 85      Minutes 15 15      METs 2 2.4        Arm Ergometer   Level 1.5 1      RPM 91 75      Minutes 15 15      METs 2 2.1        Oxygen   Maintain Oxygen Saturation 88% or higher 88% or  higher               Exercise Comments:   Exercise Goals and Review:   Exercise Goals     Row Name 11/10/22 1542             Exercise Goals   Increase Physical Activity Yes       Intervention Provide advice, education, support and counseling about physical activity/exercise needs.;Develop an individualized exercise prescription for aerobic and resistive training based on initial evaluation findings, risk stratification, comorbidities and participant's personal goals.       Expected Outcomes Short Term: Attend rehab on a regular basis to increase amount of physical activity.;Long Term: Add in home exercise to make exercise part of routine and to increase amount of physical activity.;Long Term: Exercising regularly at least 3-5 days a week.       Increase Strength and Stamina Yes  Intervention Provide advice, education, support and counseling about physical activity/exercise needs.;Develop an individualized exercise prescription for aerobic and resistive training based on initial evaluation findings, risk stratification, comorbidities and participant's personal goals.       Expected Outcomes Short Term: Increase workloads from initial exercise prescription for resistance, speed, and METs.;Short Term: Perform resistance training exercises routinely during rehab and add in resistance training at home;Long Term: Improve cardiorespiratory fitness, muscular endurance and strength as measured by increased METs and functional capacity ( )       Able to understand and use rate of perceived exertion (RPE) scale Yes       Intervention Provide education and explanation on how to use RPE scale       Expected Outcomes Short Term: Able to use RPE daily in rehab to express subjective intensity level;Long Term:  Able to use RPE to guide intensity level when exercising independently       Knowledge and understanding of Target Heart Rate Range (THRR) Yes       Intervention Provide education and  explanation of THRR including how the numbers were predicted and where they are located for reference       Expected Outcomes Short Term: Able to state/look up THRR;Long Term: Able to use THRR to govern intensity when exercising independently;Short Term: Able to use daily as guideline for intensity in rehab       Able to check pulse independently Yes       Intervention Provide education and demonstration on how to check pulse in carotid and radial arteries.;Review the importance of being able to check your own pulse for safety during independent exercise       Expected Outcomes Short Term: Able to explain why pulse checking is important during independent exercise       Understanding of Exercise Prescription Yes       Intervention Provide education, explanation, and written materials on patient's individual exercise prescription       Expected Outcomes Short Term: Able to explain program exercise prescription;Long Term: Able to explain home exercise prescription to exercise independently                Exercise Goals Re-Evaluation :  Exercise Goals Re-Evaluation     Row Name 12/02/22 1448 12/25/22 1521 01/27/23 1520         Exercise Goal Re-Evaluation   Exercise Goals Review Increase Physical Activity;Increase Strength and Stamina;Understanding of Exercise Prescription Increase Physical Activity;Increase Strength and Stamina;Understanding of Exercise Prescription Increase Strength and Stamina;Improve claudication pain tolerance and improve walking ability;Increase Physical Activity     Comments Lousie is doing well in rehab. She was not here for a week due to her daughter not feeling well and that is her transportation. She still has her normal aches and pains in her knees and back. She stated that after she exercises she feels a little bit more energized. She has a pedel at home where she can do both her legs and also her arms. She does go to [employer] once in a while and does seated  exercise. Lousie is doing well when she comes to rehab. She has transpertation with her daurghter and comes about once a week. She is planning on going to the [employer] with her daughter to exercise as well/, She stated that she does not feel like shes improved much but since she is not coming to rehab regularly that would match up. Lousie is doing well when she comes to rehab. She is  dependent on transpertation from her daughter and is unreliable to come twice a week. She is still wanting to go to the [employer] with her daughter. She stated that she has been decorating for Christmas and has been needing to take breaks here and there due to fatigue.     Expected Outcomes Short term: increase levels on the Nustep   long term: continue to exercise at rehab and home Short: attend rehab regulaly basis long term: [facility] or [facility] Short: attend rehab regulaly basis long term: exericse at home or [facility]               Discharge Exercise Prescription (Final Exercise Prescription Changes):  Exercise Prescription Changes - 12/25/22 1500       Response to Exercise   Blood Pressure (Admit) 122/60    Blood Pressure (Exercise) 126/64    Blood Pressure (Exit) 118/60    Heart Rate (Admit) 67 bpm    Heart Rate (Exercise) 121 bpm    Heart Rate (Exit) 73 bpm    Rating of Perceived Exertion (Exercise) 12    Duration Continue with 30 min of aerobic exercise without signs/symptoms of physical distress.    Intensity THRR unchanged      Progression   Progression Continue to progress workloads to maintain intensity without signs/symptoms of physical distress.      Resistance Training   Training Prescription Yes    Weight 2 lbs / orange band    Reps 10-15      NuStep   Level 2    SPM 85    Minutes 15    METs 2.4      Arm Ergometer   Level 1    RPM 75    Minutes 15    METs 2.1      Oxygen   Maintain Oxygen Saturation 88% or higher             Nutrition:  Target Goals: Understanding of  nutrition guidelines, daily intake of sodium 1500mg , cholesterol 200mg , calories 30% from fat and 7% or less from saturated fats, daily to have 5 or more servings of fruits and vegetables.  Biometrics:  Pre Biometrics - 11/10/22 1543       Pre Biometrics   Height 5\' 1"  (1.549 m)    Weight 176 lb 9.4 oz (80.1 kg)    Waist Circumference 43 inches    Hip Circumference 45 inches    Waist to Hip Ratio 0.96 %    BMI (Calculated) 33.38    Grip Strength 17.1 kg              Nutrition Therapy Plan and Nutrition Goals:   Nutrition Assessments:  MEDIFICTS Score Key: >=70 Need to make dietary changes  40-70 Heart Healthy Diet <= 40 Therapeutic Level Cholesterol Diet  Flowsheet Row CARDIAC REHAB PHASE II ORIENTATION from 11/10/2022 in [facility] CARD[facility]AC [facility]  Picture Your Plate Total Score on Admission 26      Picture Your Plate Scores: <40 Unhealthy dietary pattern with much room for improvement. 41-50 Dietary pattern unlikely to meet recommendations for good health and room for improvement. 51-60 More healthful dietary pattern, with some room for improvement.  >60 Healthy dietary pattern, although there may be some specific behaviors that could be improved.    Nutrition Goals Re-Evaluation:  Nutrition Goals Re-Evaluation     Row Name 12/02/22 1505 12/25/22 1527 01/27/23 1537         Goals   Nutrition Goal  Healthy heathy Healthy heathy Healthy heathy     Comment Greylynn stated that seh gained a few pounds and said she love ice cream. She eats a ice cream cone about everyday. She does try to eat mostly veggies and she eats a good amount of chicken. She does et smaller portions for meals and does not eat fried food due it hurting her stomach. She does cook most of her meals. She does like to go out once in a while and does choose healthy options. Hailo stated that she does eat larger portions and that she loves sweets and ice cream. She is trying to pick healthier  options when eating and sticks to chicken, salmon and some ground beef. SHe does like salads. She does not drink soda and only drinks one cup coffee in the mornings. Quincie still contonues to eat larger portion meals but has been trying to cut back. She loves sweets and ice cream. She continues to try to pick healthier options and likes chicken and salmon and does eat some ground beef each week. She continues to only drink water and one cup of coffee during the day.     Expected Outcome Short term: try to cut back on ice cream throught the weeek    long term: contiue to pick healthy options for meal Short term: try to cut back on sweets and choice smaller portion control   long term: contiue to pick healthy options for meal Short term: try to cut back on sweets and choice smaller portion control   long term: contiue to pick healthy options for meal              Nutrition Goals Discharge (Final Nutrition Goals Re-Evaluation):  Nutrition Goals Re-Evaluation - 01/27/23 1537       Goals   Nutrition Goal Healthy heathy    Comment Quenisha still contonues to eat larger portion meals but has been trying to cut back. She loves sweets and ice cream. She continues to try to pick healthier options and likes chicken and salmon and does eat some ground beef each week. She continues to only drink water and one cup of coffee during the day.    Expected Outcome Short term: try to cut back on sweets and choice smaller portion control   long term: contiue to pick healthy options for meal             Psychosocial: Target Goals: Acknowledge presence or absence of significant depression and/or stress, maximize coping skills, provide positive support system. Participant is able to verbalize types and ability to use techniques and skills needed for reducing stress and depression.  Initial Review & Psychosocial Screening:  Initial Psych Review & Screening - 11/10/22 1518       Initial Review   Current issues  with Current Anxiety/Panic;Current Psychotropic Meds;History of Depression;Current Depression;Current Stress Concerns;Current Sleep Concerns    Source of Stress Concerns Family      Family Dynamics   Good Support System? Yes      Barriers   Psychosocial barriers to participate in program There are no identifiable barriers or psychosocial needs.;The patient should benefit from training in stress management and relaxation.      Screening Interventions   Interventions Encouraged to exercise;To provide support and resources with identified psychosocial needs;Provide feedback about the scores to participant    Expected Outcomes Short Term goal: Utilizing psychosocial counselor, staff and physician to assist with identification of specific Stressors or current issues interfering with  healing process. Setting desired goal for each stressor or current issue identified.;Long Term Goal: Stressors or current issues are controlled or eliminated.;Short Term goal: Identification and review with participant of any Quality of Life or Depression concerns found by scoring the questionnaire.;Long Term goal: The participant improves quality of Life and PHQ9 Scores as seen by post scores and/or verbalization of changes             Quality of Life Scores:  Quality of Life - 11/10/22 1551       Quality of Life   Select Quality of Life      Quality of Life Scores   Health/Function Pre 23.18 %    Socioeconomic Pre 25.43 %    Psych/Spiritual Pre 21.86 %    Family Pre 24 %    GLOBAL Pre 23.48 %            Scores of 19 and below usually indicate a poorer quality of life in these areas.  A difference of  2-3 points is a clinically meaningful difference.  A difference of 2-3 points in the total score of the Quality of Life Index has been associated with significant improvement in overall quality of life, self-image, physical symptoms, and general health in studies assessing change in quality of  life.  PHQ-9: Review Flowsheet       12/25/2022 11/10/2022  Depression screen PHQ 2/9  Decreased Interest 2 3  Down, Depressed, Hopeless 2 3  PHQ - 2 Score 4 6  Altered sleeping 2 3  Tired, decreased energy 2 3  Change in appetite 1 2  Feeling bad or failure about yourself  0 0  Trouble concentrating 0 1  Moving slowly or fidgety/restless 1 1  Suicidal thoughts 0 0  PHQ-9 Score 10 16  Difficult doing work/chores Somewhat difficult Somewhat difficult    Details           Interpretation of Total Score  Total Score Depression Severity:  1-4 = Minimal depression, 5-9 = Mild depression, 10-14 = Moderate depression, 15-19 = Moderately severe depression, 20-27 = Severe depression   Psychosocial Evaluation and Intervention:  Psychosocial Evaluation - 11/10/22 1523       Psychosocial Evaluation & Interventions   Interventions Stress management education;Relaxation education;Encouraged to exercise with the program and follow exercise prescription    Comments Patient was referred to [facility] with TAVR. Her PHQ-9 score was 16. She is currently being treated for depression and anxiety with Zoloft. 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 She lives with her daughter who is bi-polar. She does support her some providing transportation. Her sister is her main support person and some friends at her church.  She says she has trouble staying asleep which she takes Alprazolam for which helps. She sleeps in a recliner. She says she slept by her son's bed in a recliner for so many years she just has not slept in her bed and is more comfortable in the recliner. Her main stressor in her life currently is her 48 year old granddaughter left home 2 years ago and has not contacted anyone in her family. She says she worries about her grandaugther everyday and can not get her off of her mind. She says she saw a counselor at her chruch for  a few times after her son died but stopped going. She does not feel like she needs to see  a Veterinary surgeon. She feels her depression and anxiety are managed okay with medication. Her main goals for the program are to improve her overall mobility, improve her strength and stamina and get healthier overall. She has no barriers identified to participate in the program.    Expected Outcomes Short Term: attend the program consistently. Long term: meet her personal goals.    Continue Psychosocial Services  Follow up required by staff             Psychosocial Re-Evaluation:  Psychosocial Re-Evaluation     Row Name 12/02/22 1456 12/25/22 1525 01/27/23 1528         Psychosocial Re-Evaluation   Current issues with History of Depression;Current Depression;Current Sleep Concerns;Current Stress Concerns History of Depression;Current Depression;Current Sleep Concerns;Current Stress Concerns History of Depression;Current Depression;Current Sleep Concerns;Current Stress Concerns     Comments Usha is doing well in rehab. She is currently taking Zoloft for depression and anxiety. She lives with her daughter who is bi-polar and she does support her by providing trasportation. She stated that she will sleep half the night and then wake up and can not get back to sleep. She still has stress about her granddaughter levaving home over a year ago and has not contacted the family. This is her main stressor and she does not have a way to contact her. Lousie is doing okay in rehab when she comes. She is still taking Zoloft fir depression and anixety. She depends on her daughter for transpertation and it does make it hard for her due to being bi-polar and not knowoing how she will feel. She stays worried about her granddaughter when left home over a year ago and they have not contacted the family. This continues to be her main stressor Lousie is okay with rehab when she comes. She is still taking Zolof for depression and  anixety. She is dependent of her daughter for transpertation and it is making it hard for her to come to rehab regulary. She coninues to worry about her granddaughter who left home over a year ago and has not contacted the family.This continues to be her main stressor in life her family. She goes to her [facility] small group to find friends and joy.     Expected Outcomes Short term: continue to pray about granddaughter to help fing relef in stress.   long term: continue to find joy and be positive Short term: continue to pray about granddaughter to help fing relef in stress.   long term: continue to find joy and be positive Short term: continue to pray about granddaughter to help fing relef in stress.   long term: continue to find joy and be positive     Interventions Stress management education;Relaxation education;Encouraged to attend Cardiac Rehabilitation for the exercise -- Stress management education;Relaxation education;Encouraged to attend Cardiac Rehabilitation for the exercise     Continue Psychosocial Services  Follow up required by staff Follow up required by staff Follow up required by staff       Initial Review   Source of Stress Concerns -- -- Family              Psychosocial Discharge (Final Psychosocial Re-Evaluation):  Psychosocial Re-Evaluation - 01/27/23 1528       Psychosocial Re-Evaluation   Current issues with History of Depression;Current Depression;Current Sleep Concerns;Current Stress Concerns    Comments Lousie is okay with rehab when she comes. 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 She is dependent of her  daughter for transpertation and it is making it hard for her to come to rehab regulary. She coninues to worry about her granddaughter who left home over a year ago and has not contacted the family.This continues to be her main stressor in life her family. She goes to her OBC small group to find friends and joy.    Expected Outcomes Short term: continue to  pray about granddaughter to help fing relef in stress.   long term: continue to find joy and be positive    Interventions Stress management education;Relaxation education;Encouraged to attend Cardiac Rehabilitation for the exercise    Continue Psychosocial Services  Follow up required by staff      Initial Review   Source of Stress Concerns Family             Vocational Rehabilitation: Provide vocational rehab assistance to qualifying candidates.   Vocational Rehab Evaluation & Intervention:  Vocational Rehab - 11/10/22 1433       Initial Vocational Rehab Evaluation & Intervention   Assessment shows need for Vocational Rehabilitation No      Vocational Rehab Re-Evaulation   Comments Patient is retired.             Education: Education Goals: Education classes will be provided on a weekly basis, covering required topics. Participant will state understanding/return demonstration of topics presented.  Learning Barriers/Preferences:  Learning Barriers/Preferences - 11/10/22 1433       Learning Barriers/Preferences   Learning Barriers Sight    Learning Preferences Written Material;Audio;Skilled Demonstration             Education Topics: Hypertension, Hypertension Reduction -Define heart disease and high blood pressure. Discus how high blood pressure affects the body and ways to reduce high blood pressure.   Exercise and Your Heart -Discuss why it is important to exercise, the FITT principles of exercise, normal and abnormal responses to exercise, and how to exercise safely.   Angina -Discuss definition of angina, causes of angina, treatment of angina, and how to decrease risk of having angina. Flowsheet Row CARDIAC REHAB PHASE II EXERCISE from 01/08/2023 in [place] C[facility]HABILITATION  Date 11/13/22  Educator [facility]       Cardiac Medications -Review what the following cardiac medications are used for, how they affect the body, and side effects that  may occur when taking the medications.  Medications include Aspirin, Beta blockers, calcium channel blockers, ACE Inhibitors, angiotensin receptor blockers, diuretics, digoxin, and antihyperlipidemics. Flowsheet Row CARDIAC REHAB PHASE II EXERCISE from 01/08/2023 in [place] CARDIAC REHABILITATION  Date 01/01/23  Educator hb  Instruction Review Code 1- Verbalizes Understanding       Congestive Heart Failure -Discuss the definition of CHF, how to live with CHF, the signs and symptoms of CHF, and how keep track of weight and sodium intake. Flowsheet Row CARDIAC REHAB PHASE II EXERCISE from 01/08/2023 in [place] CARDIAC REHABILITATION  Date 12/25/22  Educator jh  Instruction Review Code 1- Verbalizes Understanding       Heart Disease and Intimacy -Discus the effect sexual activity has on the heart, how changes occur during intimacy as we age, and safety during sexual activity.   Smoking Cessation / COPD -Discuss different methods to quit smoking, the health benefits of quitting smoking, and the definition of COPD.   Nutrition I: Fats -Discuss the types of cholesterol, what cholesterol does to the heart, and how cholesterol levels can be controlled. Flowsheet Row CARDIAC REHAB PHASE II EXERCISE from 01/08/2023 in [place]  [facility]  Date 12/11/22  Educator HB  Instruction Review Code 1- Verbalizes Understanding       Nutrition II: Labels -Discuss the different components of food labels and how to read food label   Heart Parts/Heart Disease and PAD -Discuss the anatomy of the heart, the pathway of blood circulation through the heart, and these are affected by heart disease.   Stress I: Signs and Symptoms -Discuss the causes of stress, how stress may lead to anxiety and depression, and ways to limit stress.   Stress II: Relaxation -Discuss different types of relaxation techniques to limit stress.   Warning Signs of Stroke / TIA -Discuss definition  of a stroke, what the signs and symptoms are of a stroke, and how to identify when someone is having stroke.   Knowledge Questionnaire Score:  Knowledge Questionnaire Score - 11/10/22 1510       Knowledge Questionnaire Score   Pre Score 18/24             Core Components/Risk Factors/Patient Goals at Admission:  Personal Goals and Risk Factors at Admission - 11/10/22 1517       Core Components/Risk Factors/Patient Goals on Admission    Weight Management Weight Maintenance    Hypertension Yes    Intervention Provide education on lifestyle modifcations including regular physical activity/exercise, weight management, moderate sodium restriction and increased consumption of fresh fruit, vegetables, and low fat dairy, alcohol moderation, and smoking cessation.;Monitor prescription use compliance.    Expected Outcomes Short Term: Continued assessment and intervention until BP is < 140/44mm HG in hypertensive participants. < 130/72mm HG in hypertensive participants with diabetes, heart failure or chronic kidney disease.;Long Term: Maintenance of blood pressure at goal levels.    Lipids Yes    Intervention Provide education and support for participant on nutrition & aerobic/resistive exercise along with prescribed medications to achieve LDL 70mg , HDL >40mg .    Expected Outcomes Short Term: Participant states understanding of desired cholesterol values and is compliant with medications prescribed. Participant is following exercise prescription and nutrition guidelines.;Long Term: Cholesterol controlled with medications as prescribed, with individualized exercise RX and with personalized nutrition plan. Value goals: LDL < 70mg , HDL > 40 mg.    Stress Yes    Intervention Offer individual and/or small group education and counseling on adjustment to heart disease, stress management and health-related lifestyle change. Teach and support self-help strategies.;Refer participants experiencing significant  psychosocial distress to appropriate mental health specialists for further evaluation and treatment. When possible, include family members and significant others in education/counseling sessions.    Expected Outcomes Short Term: Participant demonstrates changes in health-related behavior, relaxation and other stress management skills, ability to obtain effective social support, and compliance with psychotropic medications if prescribed.;Long Term: Emotional wellbeing is indicated by absence of clinically significant psychosocial distress or social isolation.             Core Components/Risk Factors/Patient Goals Review:   Goals and Risk Factor Review     Row Name 12/02/22 1513 12/25/22 1530 01/27/23 1540         Core Components/Risk Factors/Patient Goals Review   Personal Goals Review Weight Management/Obesity;Stress Weight Management/Obesity;Stress Weight Management/Obesity;Stress     Review Ayonna does not weigh everyday at home and stated that she has gained some weight. She is trying to cook healthy options and does eat smaller portions for meals. She does attend church at [facility] and loves her small group classes. Going to her small group helps her talk to  friends and helps with her stress in her life. Lousie does not weight at home abd stated that she has gained some weight. She is trying to pick healthy options to help with weight but eats larger portions. She loves going to her church and being with her small group at [facility]. She stated that being with her small groups help her talk to friends and stress in her life. Lousie does not weight at home but has not gained weight. She is trying to pick healthy options to help with weight but eats larger portions. She loves going to her church and being with her small group at [facility]. She stated that being with her small groups help her talk to friends and stress in her life.     Expected Outcomes Short term: dodaily weight checks and cut back on ice cream    long term: continue to attend small groups to find joy Short term: try to do daily weight checks    long termL continue to attend small group for joy and to help with stress Short term: try to do daily weight checks    long termL continue to attend small group for joy and to help with stress              Core Components/Risk Factors/Patient Goals at Discharge (Final Review):   Goals and Risk Factor Review - 01/27/23 1540       Core Components/Risk Factors/Patient Goals Review   Personal Goals Review Weight Management/Obesity;Stress    Review Lousie does not weight at home but has not gained weight. She is trying to pick healthy options to help with weight but eats larger portions. She loves going to her church and being with her small group at [facility]. She stated that being with her small groups help her talk to friends and stress in her life.    Expected Outcomes Short term: try to do daily weight checks    long termL continue to attend small group for joy and to help with stress             ITP Comments:  ITP Comments     Row Name 11/10/22 1558 11/13/22 1522 12/03/22 1643 12/31/22 0831 01/28/23 1548   ITP Comments Patient arrived for 1st visit/orientation/education at 1400. 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 During orientation advised patient on arrival and appointment times what to wear, what to do before, during and after exercise. Reviewed attendance and class policy.  Pt is scheduled to return Cardiac Rehab on 11/13/22 at 1500. Pt was advised to come to class 15 minutes before class starts.  Discussed RPE/Dpysnea scales. Patient participated in warm up stretches. Patient was able to complete 6 minute walk test.  Telemetry:NSR. Patient was measured for the equipment. Discussed equipment safety with patient. Took patient pre-anthropometric measurements. Patient finished visit at 1515. .First full day of exercise!  Patient was oriented to gym and equipment including  functions, settings, policies, and procedures.  Patient's individual exercise prescription and treatment plan were reviewed.  All starting workloads were established based on the results of the 6 minute walk test done at initial orientation visit.  The plan for exercise progression was also introduced and progression will be customized based on patient's performance and goals. 30 day review completed. ITP sent to Dr. Dina Rich, Medical Director of Cardiac Rehab. Continue with ITP unless changes are made by physician. 30 day review completed. ITP sent to Dr. Dina Rich,  Medical Director of Cardiac Rehab. Continue with ITP unless changes are made by physician. 30 day review completed. ITP sent to Dr. Dina Rich, Medical Director of Cardiac Rehab. Continue with ITP unless changes are made by physician.            Comments: 30 day review

## 2023-01-29 ENCOUNTER — Encounter (HOSPITAL_COMMUNITY): Payer: Medicare Other

## 2023-02-03 ENCOUNTER — Encounter (HOSPITAL_COMMUNITY): Payer: Medicare Other

## 2023-02-04 ENCOUNTER — Telehealth: Payer: Self-pay | Admitting: Radiation Oncology

## 2023-02-04 ENCOUNTER — Inpatient Hospital Stay (HOSPITAL_BASED_OUTPATIENT_CLINIC_OR_DEPARTMENT_OTHER): Payer: Medicare Other | Admitting: Internal Medicine

## 2023-02-04 ENCOUNTER — Other Ambulatory Visit: Payer: Self-pay

## 2023-02-04 VITALS — BP 168/57 | HR 73 | Temp 97.3°F | Resp 15 | Ht 61.0 in | Wt 180.7 lb

## 2023-02-04 DIAGNOSIS — C3490 Malignant neoplasm of unspecified part of unspecified bronchus or lung: Secondary | ICD-10-CM

## 2023-02-04 DIAGNOSIS — Z952 Presence of prosthetic heart valve: Secondary | ICD-10-CM | POA: Diagnosis not present

## 2023-02-04 NOTE — Progress Notes (Signed)
[name] [phone]   [phone]  OFFICE PROGRESS NOTE  Barbara Stabile, MD [address] [place] [address]  DIAGNOSIS: highly suspicious stage Ia (T1b, N0, M0) non-small cell lung cancer presented with right upper lobe lung nodule. The biopsy was consistent with atypical cells but not diagnostic for malignancy but her PET scan showed significant hypermetabolic activity in this nodule consistent with lung malignancy.   PRIOR THERAPY: Expected curative SBRT to the right upper lobe lung nodule very soon  CURRENT THERAPY: None  INTERVAL HISTORY: Barbara Lucero 82 y.o. female returns to the clinic today for follow-up visit accompanied by her daughter.Discussed the use of AI scribe software for clinical note transcription with the patient, who gave verbal consent to proceed.  History of Present Illness   The patient, with a known history of a lung nodule in the posterior right upper lobe, was scheduled for radiation therapy. However, the treatment was delayed due to a series of health events. In August, the patient underwent aortic heart valve replacement surgery. Following recovery from the surgery, the patient contracted COVID-19. The patient has since recovered from COVID-19 and has been participating in cardiac rehabilitation.  The patient has not reported any new health issues since the last consultation four months ago, apart from the heart surgery and COVID-19.       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 Clifton Lucero & Dr. Leafy Lucero   Severe  aortic stenosis    Varicose veins of bilateral lower extremities with other complications     ALLERGIES:  is allergic to coconut (cocos nucifera) and latex.  MEDICATIONS:  Current Outpatient Medications  Medication Sig Dispense Refill   acetaminophen (TYLENOL) 500 MG tablet Take 1,000 mg by mouth 2 (two) times daily.     albuterol (VENTOLIN HFA) 108 (90 Base) MCG/ACT inhaler Inhale 2 puffs into the lungs every 4 (four) hours as needed for shortness of breath or wheezing.     ALPRAZolam (XANAX) 0.25 MG tablet Take 0.25 mg by mouth at bedtime.     Ascorbic Acid (VITAMIN C) 1000 MG tablet Take 1,000 mg by mouth daily.     aspirin EC 81 MG tablet Take 81 mg by mouth at bedtime. Swallow whole.     b complex vitamins tablet Take 1 tablet by mouth daily.     Calcium Carb-Cholecalciferol (CALCIUM 600+D) 600-20 MG-MCG TABS Take 1 tablet by mouth daily.     Cholecalciferol (VITAMIN D3) 50 MCG (2000 UT) capsule Take 2,000 Units by mouth daily.     cyanocobalamin (VITAMIN B12) 1000 MCG tablet Take 1,000 mcg by mouth daily as needed (energy).     furosemide (LASIX) 20 MG tablet Take 20 mg by mouth every Wednesday.     gabapentin (NEURONTIN) 100 MG capsule Take 1 capsule (100 mg total) by mouth 2 (two) times daily.     hydrocortisone cream 1 % Apply 1 Application topically 2 (two) times daily as needed (  rash).     Multiple Vitamins-Minerals (HAIR SKIN & NAILS) TABS Take 3 tablets by mouth daily.     Multiple Vitamins-Minerals (MULTIVITAMIN WITH MINERALS) tablet Take 1 tablet by mouth daily.     olmesartan (BENICAR) 40 MG tablet Take 40 mg by mouth daily.     pantoprazole (PROTONIX) 40 MG tablet Take 40 mg by mouth daily.     Psyllium (METAMUCIL PO) Take 1 Dose by mouth daily as needed (constipation). 1 dose = 1 teaspoonful     sertraline (ZOLOFT) 100 MG tablet Take 100 mg by mouth at bedtime.     simvastatin (ZOCOR) 10 MG tablet Take 10 mg by mouth at bedtime.     solifenacin (VESICARE) 10 MG tablet  TAKE (1) TABLET BY MOUTH AT BEDTIME. 30 tablet 11   sulfaSALAzine (AZULFIDINE) 500 MG tablet Take 1 tablet (500 mg total) by mouth 2 (two) times daily. 60 tablet 2   vitamin E 400 UNIT capsule Take 400 Units by mouth daily.     No current facility-administered medications for this visit.    SURGICAL HISTORY:  Past Surgical History:  Procedure Laterality Date   BOWEL RESECTION  1970   BRONCHIAL BIOPSY  09/01/2022   Procedure: BRONCHIAL BIOPSIES;  Surgeon: Leslye Peer, MD;  Location: [facility] ENDOSCOPY;  Service: Pulmonary;;   BRONCHIAL BRUSHINGS  09/01/2022   Procedure: BRONCHIAL BRUSHINGS;  Surgeon: Leslye Peer, MD;  Location: [facility] ENDOSCOPY;  Service: Pulmonary;;   BRONCHIAL NEEDLE ASPIRATION BIOPSY  09/01/2022   Procedure: BRONCHIAL NEEDLE ASPIRATION BIOPSIES;  Surgeon: Leslye Peer, MD;  Location: [facility] ENDOSCOPY;  Service: Pulmonary;;   EYE SURGERY Bilateral    cataract   FIDUCIAL MARKER PLACEMENT  09/01/2022   Procedure: FIDUCIAL MARKER PLACEMENT;  Surgeon: Leslye Peer, MD;  Location: [facility] ENDOSCOPY;  Service: Pulmonary;;   HIP ARTHROPLASTY Left 06/06/2016   Procedure: ARTHROPLASTY BIPOLAR HIP (HEMIARTHROPLASTY);  Surgeon: Vickki Hearing, MD;  Location: [facility] ORS;  Service: Orthopedics;  Laterality: Left;   INTRAOPERATIVE TRANSTHORACIC ECHOCARDIOGRAM N/A 10/07/2022   Procedure: INTRAOPERATIVE TRANSTHORACIC ECHOCARDIOGRAM;  Surgeon: Kathleene Hazel, MD;  Location: [facility] INVASIVE CV LAB;  Service: Open Heart Surgery;  Laterality: N/A;   RIGHT HEART CATH AND CORONARY ANGIOGRAPHY N/A 07/07/2022   Procedure: RIGHT HEART CATH AND CORONARY ANGIOGRAPHY;  Surgeon: Kathleene Hazel, MD;  Location: [facility] INVASIVE CV LAB;  Service: Cardiovascular;  Laterality: N/A;   TRANSCATHETER AORTIC VALVE REPLACEMENT, TRANSFEMORAL Right 10/07/2022   Procedure: Transcatheter Aortic Valve Replacement, Transfemoral;  Surgeon: Kathleene Hazel, MD;  Location: [facility] INVASIVE CV LAB;  Service: Open Heart  Surgery;  Laterality: Right;    REVIEW OF SYSTEMS:  Constitutional: positive for fatigue Eyes: negative Ears, nose, mouth, throat, and face: negative Respiratory: positive for dyspnea on exertion Cardiovascular: negative Gastrointestinal: negative Genitourinary:negative Integument/breast: negative Hematologic/lymphatic: negative Musculoskeletal:negative Neurological: negative Behavioral/Psych: negative Endocrine: negative Allergic/Immunologic: negative   PHYSICAL EXAMINATION: General appearance: alert, cooperative, fatigued, and no distress Head: Normocephalic, without obvious abnormality, atraumatic Neck: no adenopathy, no JVD, supple, symmetrical, trachea midline, and thyroid not enlarged, symmetric, no tenderness/mass/nodules Lymph nodes: Cervical, supraclavicular, and axillary nodes normal. Resp: clear to auscultation bilaterally Back: symmetric, no curvature. ROM normal. No CVA tenderness. Cardio: regular rate and rhythm, S1, S2 normal, no murmur, click, rub or gallop GI: soft, non-tender; bowel sounds normal; no masses,  no organomegaly Extremities: extremities normal, atraumatic, no cyanosis or edema Neurologic: Alert and oriented X 3, normal strength and tone. Normal symmetric reflexes. Normal coordination and gait  ECOG PERFORMANCE STATUS: 1 - Symptomatic but completely ambulatory  Blood pressure (!) 168/57, pulse 73, temperature (!) 97.3 F (36.3 C), temperature source Temporal, resp. rate 15, height 5\' 1"  (1.549 m), weight 180 lb 11.2 oz (82 kg), SpO2 96%.  LABORATORY DATA: Lab Results  Component Value Date   WBC 7.1 01/27/2023   HGB 11.6 (L) 01/27/2023   HCT 35.4 (L) 01/27/2023   MCV 90.5 01/27/2023   PLT 115 (L) 01/27/2023      Chemistry      Component Value Date/Time   NA 140 01/27/2023 0945   NA 143 06/23/2022 1201   K 3.8 01/27/2023 0945   CL 102 01/27/2023 0945   CO2 33 (H) 01/27/2023 0945   BUN 14 01/27/2023 0945   BUN 17 06/23/2022 1201    CREATININE 0.76 01/27/2023 0945      Component Value Date/Time   CALCIUM 9.3 01/27/2023 0945   ALKPHOS 51 01/27/2023 0945   AST 12 (L) 01/27/2023 0945   ALT 8 01/27/2023 0945   BILITOT 0.6 01/27/2023 0945       RADIOGRAPHIC STUDIES: CT Chest W Contrast  Result Date: 02/03/2023 CLINICAL DATA:  Follow-up non-small cell lung cancer EXAM: CT CHEST WITH CONTRAST TECHNIQUE: Multidetector CT imaging of the chest was performed during intravenous contrast administration. RADIATION DOSE REDUCTION: This exam was performed according to the departmental dose-optimization program which includes automated exposure control, adjustment of the mA and/or kV according to patient size and/or use of iterative reconstruction technique. CONTRAST:  75mL OMNIPAQUE IOHEXOL 300 MG/ML  SOLN COMPARISON:  CT chest dated 11/19/2022 FINDINGS: Cardiovascular: The heart is normal in size. No pericardial effusion. Status post TAVR. No evidence of thoracic aortic aneurysm. Atherosclerotic calcifications of the aortic arch. Moderate three-vessel coronary atherosclerosis. Mediastinum/Nodes: No suspicious mediastinal lymphadenopathy. Visualized thyroid is unremarkable. Lungs/Pleura: 18 x 12 mm nodular opacity in the posterior right upper lobe, corresponding to the patient's known primary bronchogenic carcinoma, with adjacent fiducial marker. No new/suspicious pulmonary nodules. Mild centrilobular and paraseptal emphysematous changes, upper lung predominant. Mild subpleural reticulation/fibrosis in the lungs bilaterally, lower lobe predominant. No focal consolidation. No pleural effusion or pneumothorax. Upper Abdomen: Visualized upper abdomen is grossly unremarkable, noting vascular calcifications. Musculoskeletal: Degenerative changes of the visualized thoracolumbar spine. IMPRESSION: 18 mm nodular opacity in the posterior right upper lobe, corresponding to the patient's known primary bronchogenic carcinoma, with adjacent fiducial marker.  No evidence of metastatic disease. Aortic Atherosclerosis (ICD10-I70.0) and Emphysema (ICD10-J43.9). Electronically Signed   By: Charline Bills M.D.   On: 02/03/2023 21:03    ASSESSMENT AND PLAN: This is a very pleasant 82 years old white female with highly suspicious stage Ia (T1b, N0, M0) non-small cell lung cancer presented with right upper lobe lung nodule. The biopsy was consistent with atypical cells but not diagnostic for malignancy but her PET scan showed significant hypermetabolic activity in this nodule consistent with lung malignancy.  The patient was supposed to have curative SBRT to this nodule by radiation oncology but this was delayed secondary to cardiac surgery as well as COVID 19 infection. She had repeat CT scan of the chest performed recently.  I personally and independently reviewed the scan and discussed the result with the patient and her daughter.    Pulmonary Nodule in Right Upper Lobe Pulmonary nodule measuring 1.8 x 1.2 cm in the posterior right upper lobe. No significant change since the last scan. No evidence of metastatic disease. Fiducial marker in place for targeted radiation therapy. Radiation therapy is  necessary to eliminate the nodule and prevent metastasis. Patient agreed to proceed. - Schedule appointment with radiation oncology within the next week or two - If no appointment within a week, instruct patient to call the office - Repeat scan a week before the follow-up visit in four months  Post-Aortic Valve Replacement Underwent aortic valve replacement surgery in August. Currently recovering and participating in cardiac rehabilitation. No new cardiac issues reported. 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 No specific ongoing issues related to COVID-19. - Monitor for post-COVID complications  Follow-up - Schedule follow-up visit in four months - Repeat scan a week before the follow-up visit.   The  patient was advised to call immediately if she has any other concerning symptoms in the interval. The patient voices understanding of current disease status and treatment options and is in agreement with the current care plan.  All questions were answered. The patient knows to call the clinic with any problems, questions or concerns. We can certainly see the patient much sooner if necessary.  The total time spent in the appointment was 30 minutes.  Disclaimer: This note was dictated with voice recognition software. Similar sounding words can inadvertently be transcribed and may not be corrected upon review.

## 2023-02-04 NOTE — Telephone Encounter (Signed)
Called patient to schedule a consultation w. Dr. Mitzi Hansen. No answer, unable to LVM, VM full.

## 2023-02-05 ENCOUNTER — Telehealth: Payer: Self-pay | Admitting: Radiation Oncology

## 2023-02-05 ENCOUNTER — Encounter (HOSPITAL_COMMUNITY): Payer: Medicare Other

## 2023-02-05 NOTE — Telephone Encounter (Signed)
Called patients daughter to schedule a consultation w. Dr. Mitzi Hansen. Patients daughter requested for consult to be in the afternoon after 12/25.

## 2023-02-10 ENCOUNTER — Encounter (HOSPITAL_COMMUNITY): Payer: Medicare Other

## 2023-02-12 ENCOUNTER — Encounter (HOSPITAL_COMMUNITY): Payer: Medicare Other

## 2023-02-12 ENCOUNTER — Encounter: Payer: Self-pay | Admitting: Nurse Practitioner

## 2023-02-12 ENCOUNTER — Ambulatory Visit: Payer: Medicare Other | Admitting: Nurse Practitioner

## 2023-02-12 VITALS — BP 140/74 | HR 72 | Ht 62.0 in | Wt 176.8 lb

## 2023-02-12 DIAGNOSIS — C3411 Malignant neoplasm of upper lobe, right bronchus or lung: Secondary | ICD-10-CM | POA: Diagnosis not present

## 2023-02-12 DIAGNOSIS — J432 Centrilobular emphysema: Secondary | ICD-10-CM

## 2023-02-12 NOTE — Progress Notes (Signed)
@Patient  ID: Barbara Lucero, female    DOB: 1940-08-21, 82 y.o.   MRN: [id number]  Chief Complaint  Patient presents with   Follow-up    F/U visit    Referring provider: [name], MD  HPI: 82 year old female, former smoker followed for lung mass.  She is a patient of Dr. Kavin Leech and last seen in office 08/07/2022.  Past medical history significant for hypertension, severe AS status post TAVR, GERD, anxiety, depression, Crohn's disease.  She underwent bronchoscopy in July 2024.  Final cytology consistent only with atypical cells.  Suspicion remains high for primary lung cancer.  She was presented at the thoracic conference and consensus was that there was enough suspicion that her slowly enlarging nodule was malignancy.  The plan was to move forward with PET scan and oncology referral.  This nodule did appear to be hypermetabolic on imaging.  She had follow-up with Dr. Shirline Frees.  She has been referred to Dr. Mitzi Hansen with radiation oncology and plans to start empiric SBRT 02/19/2023.   TEST/EVENTS:  09/01/2022 bronchoscopy: Cytology with atypical cells 09/26/2022 PET scan: 1.8 cm right upper lobe nodule is hypermetabolic.  Mild sternal hypermetabolism, nonspecific and unlikely to reflect isolated osseous metastasis.  Mild hypermetabolism along the posterior gastric antrum 01/28/2023 CT chest with contrast: Status post TAVR. Atherosclerosis. 18x12 mm nodular opacity in the RUL. No new nodules. Mild emphysema. Mild fibrosis in b/l lower lobes.   08/07/2022: OV with Dr. Delton Coombes for consult.  CT chest shows enlarging pulmonary nodule measuring 1.6 x 1.6 cm.  High suspicion for primary lung malignancy given history of tobacco use and appearance on imaging.  Discussed options for tissue diagnosis with her and have recommended navigational bronchoscopy.  Will need to confirm/discuss her risk with cardiology since she is under evaluation for TAVR.  02/12/2023: Today-follow-up Patient presents today for  follow-up.  She has been set up with radiation oncology and plans to start empiric SBRT 02/19/2023.  Deemed to be a poor surgical candidate.  She did also have a TAVR for severe aortic stenosis since last time she was seen.  This was completed in August 2024.  She has been undergoing cardiac rehab since.  Has recovered well since the procedure.  She feels like she has not had any issues with her breathing since the valve was fixed.  No issues with cough, chest congestion.  No hemoptysis, weight loss, anorexia.  She does not use any inhalers.  Has not required any supplemental oxygen.  Allergies  Allergen Reactions   Coconut (Cocos Nucifera) Hives   Latex Hives    Immunization History  Administered Date(s) Administered   Influenza, High Dose Seasonal PF 12/18/2017    Past Medical History:  Diagnosis Date   Anxiety    Arthritis    Cancer (HCC)    face- removed in office   Crohn disease (HCC)    Depression    DVT (deep venous thrombosis) (HCC) 07/27/2017   LLE   GERD (gastroesophageal reflux disease)    HTN (hypertension)    Hyperlipidemia    Neuropathy    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 Clifton James & Dr. Leafy Ro   Severe aortic stenosis    Varicose veins of bilateral lower extremities with other complications     Tobacco History: Social History   Tobacco Use  Smoking Status Former   Current packs/day: 0.50   Average packs/day: 0.5  packs/day for 20.0 years (10.0 ttl pk-yrs)   Types: Cigarettes  Smokeless Tobacco Never   Counseling given: Not Answered   Outpatient Medications Prior to Visit  Medication Sig Dispense Refill   acetaminophen (TYLENOL) 500 MG tablet Take 1,000 mg by mouth 2 (two) times daily.     albuterol (VENTOLIN HFA) 108 (90 Base) MCG/ACT inhaler Inhale 2 puffs into the lungs every 4 (four) hours as needed for shortness of breath or wheezing.     ALPRAZolam (XANAX) 0.25  MG tablet Take 0.25 mg by mouth at bedtime.     Ascorbic Acid (VITAMIN C) 1000 MG tablet Take 1,000 mg by mouth daily.     aspirin EC 81 MG tablet Take 81 mg by mouth at bedtime. Swallow whole.     b complex vitamins tablet Take 1 tablet by mouth daily.     Calcium Carb-Cholecalciferol (CALCIUM 600+D) 600-20 MG-MCG TABS Take 1 tablet by mouth daily.     Cholecalciferol (VITAMIN D3) 50 MCG (2000 UT) capsule Take 2,000 Units by mouth daily.     cyanocobalamin (VITAMIN B12) 1000 MCG tablet Take 1,000 mcg by mouth daily as needed (energy).     furosemide (LASIX) 20 MG tablet Take 20 mg by mouth every Wednesday.     gabapentin (NEURONTIN) 100 MG capsule Take 1 capsule (100 mg total) by mouth 2 (two) times daily.     hydrocortisone cream 1 % Apply 1 Application topically 2 (two) times daily as needed (rash).     Multiple Vitamins-Minerals (HAIR SKIN & NAILS) TABS Take 3 tablets by mouth daily.     Multiple Vitamins-Minerals (MULTIVITAMIN WITH MINERALS) tablet Take 1 tablet by mouth daily.     olmesartan (BENICAR) 40 MG tablet Take 40 mg by mouth daily.     pantoprazole (PROTONIX) 40 MG tablet Take 40 mg by mouth daily.     Psyllium (METAMUCIL PO) Take 1 Dose by mouth daily as needed (constipation). 1 dose = 1 teaspoonful     sertraline (ZOLOFT) 100 MG tablet Take 100 mg by mouth at bedtime.     simvastatin (ZOCOR) 10 MG tablet Take 10 mg by mouth at bedtime.     solifenacin (VESICARE) 10 MG tablet TAKE (1) TABLET BY MOUTH AT BEDTIME. 30 tablet 11   sulfaSALAzine (AZULFIDINE) 500 MG tablet Take 1 tablet (500 mg total) by mouth 2 (two) times daily. 60 tablet 2   vitamin E 400 UNIT capsule Take 400 Units by mouth daily.     No facility-administered medications prior to visit.     Review of Systems:   Constitutional: No weight loss or gain, night sweats, fevers, chills, or lassitude. +fatigue (baseline) HEENT: No headaches, difficulty swallowing, tooth/dental problems, or sore throat. No sneezing,  itching, ear ache, nasal congestion, or post nasal drip CV:  No chest pain, orthopnea, PND, swelling in lower extremities, anasarca, dizziness, palpitations, syncope Resp: No shortness of breath with exertion or at rest. No excess mucus or change in color of mucus. No productive or non-productive. No hemoptysis. No wheezing.  No chest wall deformity GI:  No heartburn, indigestion Skin: No rash, lesions, ulcerations MSK:  +chronic joint pains Neuro: No dizziness or lightheadedness. +neuropathy  Psych: No depression or anxiety. Mood stable.     Physical Exam:  BP (!) 140/74 (BP Location: Right Arm, Patient Position: Sitting, Cuff Size: Large)   Pulse 72   Ht 5\' 2"  (1.575 m)   Wt 176 lb 12.8 oz (80.2 kg)   SpO2 95%  BMI 32.34 kg/m   GEN: Pleasant, interactive, well-kempt; in no acute distress HEENT:  Normocephalic and atraumatic. PERRLA. Sclera white. Nasal turbinates pink, moist and patent bilaterally. No rhinorrhea present. Oropharynx pink and moist, without exudate or edema. No lesions, ulcerations, or postnasal drip.  NECK:  Supple w/ fair ROM. No JVD present. Normal carotid impulses w/o bruits. Thyroid symmetrical with no goiter or nodules palpated. No lymphadenopathy.   CV: RRR, murmur, no peripheral edema. Pulses intact, +2 bilaterally. No cyanosis, pallor or clubbing. PULMONARY:  Unlabored, regular breathing. Clear bilaterally A&P w/o wheezes/rales/rhonchi. No accessory muscle use.  GI: BS present and normoactive. Soft, non-tender to palpation. No organomegaly or masses detected. MSK: No erythema, warmth or tenderness. Cap refil <2 sec all extrem. No deformities or joint swelling noted.  Neuro: A/Ox3. No focal deficits noted.   Skin: Warm, no lesions or rashe Psych: Normal affect and behavior. Judgement and thought content appropriate.     Lab Results:  CBC    Component Value Date/Time   WBC 7.1 01/27/2023 0945   WBC 7.5 10/08/2022 0119   RBC 3.91 01/27/2023 0945   HGB  11.6 (L) 01/27/2023 0945   HGB 12.5 06/23/2022 1201   HCT 35.4 (L) 01/27/2023 0945   HCT 37.7 06/23/2022 1201   PLT 115 (L) 01/27/2023 0945   PLT 168 06/23/2022 1201   MCV 90.5 01/27/2023 0945   MCV 86 06/23/2022 1201   MCH 29.7 01/27/2023 0945   MCHC 32.8 01/27/2023 0945   RDW 12.8 01/27/2023 0945   RDW 15.3 06/23/2022 1201   LYMPHSABS 1.5 01/27/2023 0945   MONOABS 0.7 01/27/2023 0945   EOSABS 0.1 01/27/2023 0945   BASOSABS 0.0 01/27/2023 0945    BMET    Component Value Date/Time   NA 140 01/27/2023 0945   NA 143 06/23/2022 1201   K 3.8 01/27/2023 0945   CL 102 01/27/2023 0945   CO2 33 (H) 01/27/2023 0945   GLUCOSE 95 01/27/2023 0945   BUN 14 01/27/2023 0945   BUN 17 06/23/2022 1201   CREATININE 0.76 01/27/2023 0945   CALCIUM 9.3 01/27/2023 0945   GFRNONAA >60 01/27/2023 0945   GFRAA >60 06/08/2016 0633    BNP No results found for: "BNP"   Imaging:  CT Chest W Contrast Result Date: 02/03/2023 CLINICAL DATA:  Follow-up non-small cell lung cancer EXAM: CT CHEST WITH CONTRAST TECHNIQUE: Multidetector CT imaging of the chest was performed during intravenous contrast administration. RADIATION DOSE REDUCTION: This exam was performed according to the departmental dose-optimization program which includes automated exposure control, adjustment of the mA and/or kV according to patient size and/or use of iterative reconstruction technique. CONTRAST:  75mL OMNIPAQUE IOHEXOL 300 MG/ML  SOLN COMPARISON:  CT chest dated 11/19/2022 FINDINGS: Cardiovascular: The heart is normal in size. No pericardial effusion. Status post TAVR. No evidence of thoracic aortic aneurysm. Atherosclerotic calcifications of the aortic arch. Moderate three-vessel coronary atherosclerosis. Mediastinum/Nodes: No suspicious mediastinal lymphadenopathy. Visualized thyroid is unremarkable. Lungs/Pleura: 18 x 12 mm nodular opacity in the posterior right upper lobe, corresponding to the patient's known primary  bronchogenic carcinoma, with adjacent fiducial marker. No new/suspicious pulmonary nodules. Mild centrilobular and paraseptal emphysematous changes, upper lung predominant. Mild subpleural reticulation/fibrosis in the lungs bilaterally, lower lobe predominant. No focal consolidation. No pleural effusion or pneumothorax. Upper Abdomen: Visualized upper abdomen is grossly unremarkable, noting vascular calcifications. Musculoskeletal: Degenerative changes of the visualized thoracolumbar spine. IMPRESSION: 18 mm nodular opacity in the posterior right upper lobe, corresponding to the patient's known  primary bronchogenic carcinoma, with adjacent fiducial marker. No evidence of metastatic disease. Aortic Atherosclerosis (ICD10-I70.0) and Emphysema (ICD10-J43.9). Electronically Signed   By: Charline Bills M.D.   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 Mitzi Hansen. Treatment/workup delayed due to TAVR. Recent imaging with stable nodule and without any new nodules. Follow up with oncology as scheduled.   Patient Instructions  Glad you have been doing well and your valve repair went well.   Recent CT showed stable lung mass. There were no other concerning mass/nodules. You do have mild emphysema but since you're not having any issues with your breathing, we will just monitor this.  Radiation treatments will start 12/26.  You will continue your follow up with radiation oncology and Dr. Arbutus Ped as scheduled.   Follow up in 6 months with Dr. Delton Coombes. If symptoms worsen, please contact office for sooner follow up or seek emergency care.    Centrilobular emphysema (HCC) Minimal symptom burden. Previous DOE seems to have improved with TAVR.  Continue to monitor.  No indication for  scheduled bronchodilator therapy at this time.   Advised if symptoms do not improve or worsen, to please contact office for sooner follow up or seek emergency care.   I spent 25 minutes of dedicated to the care of this patient on the date of this encounter to include pre-visit review of records, face-to-face time with the patient discussing conditions above, post visit ordering of testing, clinical documentation with the electronic health record, making appropriate referrals as documented, and communicating necessary findings to members of the patients care team.  Noemi Chapel, NP 02/13/2023  Pt aware and understands NP's role.

## 2023-02-12 NOTE — Patient Instructions (Addendum)
Glad you have been doing well and your valve repair went well.   Recent CT showed stable lung mass. There were no other concerning mass/nodules. You do have mild emphysema but since you're not having any issues with your breathing, we will just monitor this.  Radiation treatments will start 12/26.  You will continue your follow up with radiation oncology and Dr. Arbutus Ped as scheduled.   Follow up in 6 months with Dr. Delton Coombes. If symptoms worsen, please contact office for sooner follow up or seek emergency care.

## 2023-02-13 ENCOUNTER — Encounter: Payer: Self-pay | Admitting: Nurse Practitioner

## 2023-02-13 DIAGNOSIS — J432 Centrilobular emphysema: Secondary | ICD-10-CM | POA: Insufficient documentation

## 2023-02-13 NOTE — Assessment & Plan Note (Signed)
Minimal symptom burden. Previous DOE seems to have improved with TAVR.  Continue to monitor.  No indication for scheduled bronchodilator therapy at this time.

## 2023-02-13 NOTE — Assessment & Plan Note (Signed)
Preparing to undergo SBRT with Dr. Mitzi Hansen. Treatment/workup delayed due to TAVR. Recent imaging with stable nodule and without any new nodules. Follow up with oncology as scheduled.   Patient Instructions  Glad you have been doing well and your valve repair went well.   Recent CT showed stable lung mass. There were no other concerning mass/nodules. You do have mild emphysema but since you're not having any issues with your breathing, we will just monitor this.  Radiation treatments will start 12/26.  You will continue your follow up with radiation oncology and Dr. Arbutus Ped as scheduled.   Follow up in 6 months with Dr. Delton Coombes. If symptoms worsen, please contact office for sooner follow up or seek emergency care.

## 2023-02-17 ENCOUNTER — Encounter (HOSPITAL_COMMUNITY): Payer: Medicare Other

## 2023-02-17 NOTE — Progress Notes (Signed)
Thoracic Location of Tumor / Histology: RUL Lung  CT Chest 01/28/2023: 18 mm nodular opacity in the posterior right upper lobe, corresponding to the patient's known primary bronchogenic carcinoma, with adjacent fiducial marker.   Past/Anticipated interventions by cardiothoracic surgery, if any:  -Not a surgical candidate   Past/Anticipated interventions by medical oncology, if any:  Dr. Arbutus Ped 02/12/2023 -She underwent bronchoscopy in July 2024. Final cytology consistent only with atypical cells. Suspicion remains high for primary lung cancer.  -She was presented at the thoracic conference and consensus was that there was enough suspicion that her slowly enlarging nodule was malignancy.  -She has been referred to Dr. Mitzi Hansen with radiation oncology and plans to start empiric SBRT.    Tobacco/Marijuana/Snuff/ETOH use: Former Smoker  Signs/Symptoms Weight changes, if any: She reports she lost about 4 pounds but has since gained them back. Respiratory complaints, if any: No Hemoptysis, if any: No Pain issues, if any:  She denies chest pain, pressure, or tightness.  SAFETY ISSUES: Prior radiation? No Pacemaker/ICD?  No Possible current pregnancy?Postmenopausal Is the patient on methotrexate? No  Current Complaints / other details:

## 2023-02-19 ENCOUNTER — Ambulatory Visit
Admission: RE | Admit: 2023-02-19 | Discharge: 2023-02-19 | Disposition: A | Discharge status: Home / Self Care | Payer: Medicare Other | Source: Ambulatory Visit | Attending: Radiation Oncology | Admitting: Radiation Oncology

## 2023-02-19 ENCOUNTER — Encounter: Payer: Self-pay | Admitting: Radiation Oncology

## 2023-02-19 ENCOUNTER — Encounter (HOSPITAL_COMMUNITY): Payer: Medicare Other

## 2023-02-19 ENCOUNTER — Telehealth: Payer: Self-pay

## 2023-02-19 VITALS — BP 170/69 | HR 71 | Temp 97.8°F | Resp 20 | Ht 62.0 in | Wt 179.4 lb

## 2023-02-19 DIAGNOSIS — E785 Hyperlipidemia, unspecified: Secondary | ICD-10-CM | POA: Diagnosis not present

## 2023-02-19 DIAGNOSIS — G629 Polyneuropathy, unspecified: Secondary | ICD-10-CM | POA: Insufficient documentation

## 2023-02-19 DIAGNOSIS — Z86718 Personal history of other venous thrombosis and embolism: Secondary | ICD-10-CM | POA: Insufficient documentation

## 2023-02-19 DIAGNOSIS — Z803 Family history of malignant neoplasm of breast: Secondary | ICD-10-CM | POA: Insufficient documentation

## 2023-02-19 DIAGNOSIS — Z5986 Financial insecurity: Secondary | ICD-10-CM | POA: Diagnosis not present

## 2023-02-19 DIAGNOSIS — I700 Atherosclerosis of aorta: Secondary | ICD-10-CM | POA: Insufficient documentation

## 2023-02-19 DIAGNOSIS — Z8673 Personal history of transient ischemic attack (TIA), and cerebral infarction without residual deficits: Secondary | ICD-10-CM | POA: Insufficient documentation

## 2023-02-19 DIAGNOSIS — K219 Gastro-esophageal reflux disease without esophagitis: Secondary | ICD-10-CM | POA: Insufficient documentation

## 2023-02-19 DIAGNOSIS — J432 Centrilobular emphysema: Secondary | ICD-10-CM | POA: Diagnosis not present

## 2023-02-19 DIAGNOSIS — M129 Arthropathy, unspecified: Secondary | ICD-10-CM | POA: Insufficient documentation

## 2023-02-19 DIAGNOSIS — Z7982 Long term (current) use of aspirin: Secondary | ICD-10-CM | POA: Diagnosis not present

## 2023-02-19 DIAGNOSIS — I2510 Atherosclerotic heart disease of native coronary artery without angina pectoris: Secondary | ICD-10-CM | POA: Diagnosis not present

## 2023-02-19 DIAGNOSIS — C3411 Malignant neoplasm of upper lobe, right bronchus or lung: Secondary | ICD-10-CM | POA: Diagnosis not present

## 2023-02-19 DIAGNOSIS — Z79899 Other long term (current) drug therapy: Secondary | ICD-10-CM | POA: Insufficient documentation

## 2023-02-19 DIAGNOSIS — Z87891 Personal history of nicotine dependence: Secondary | ICD-10-CM | POA: Diagnosis not present

## 2023-02-19 DIAGNOSIS — I10 Essential (primary) hypertension: Secondary | ICD-10-CM | POA: Insufficient documentation

## 2023-02-19 DIAGNOSIS — I350 Nonrheumatic aortic (valve) stenosis: Secondary | ICD-10-CM | POA: Insufficient documentation

## 2023-02-19 NOTE — Telephone Encounter (Signed)
Clinical Social Work was referred by medical provider for assessment of psychosocial needs.  CSW attempted to contact patient by phone.  Could not leave a message on her cell phone or home phone.

## 2023-02-19 NOTE — Progress Notes (Signed)
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   HISTORY OF PRESENT ILLNESS: Barbara Lucero is a 82 y.o. female seen at the request of Dr. Arbutus Ped for a diagnosis of putative lung cancer. She was originally found to have a lung nodule found in September 2023 while undergoing workup of nausea vomiting and weakness.  This was negative for embolism, but showed a solid pulmonary nodule measuring 11 mm and it was recommended that this be followed closely.  She had a CT angiography by her cardiologist due to history of aortic stenosis as well on 07/10/2022 and this nodule was again seen now measuring 1.6 cm in greatest dimension in the right upper lobe.  She had a prominent borderline enlarged right hilar lymph node.  She underwent bronchoscopy on 09/01/2022 with Dr. Delton Coombes and atypical cells were seen in the fine-needle aspirate and brushings but no definitive diagnostic material to conclude or deny malignancy.  She was followed with a PET scan on 09/26/2022 and this identified the nodule measuring 1.8 cm with an SUV of 12.8, her mediastinal blood pool was 2.7, focal hyper metabolic activity was noted at the gastric antrum and mild sternal hypermetabolic activity with an SUV of 2.3 was noted without a correlate on CT.  She was being referred for stereotactic body radiotherapy given her comorbidities, but she had to undergo TAVR on 10/07/2022 and her postoperative recovery was complicated by COVID and her course has been also involved with cardiac rehabilitation.  She had repeat CT with contrast of the chest on 01/28/2023 which showed persistence of the nodule in the right upper lobe measuring 1.8 cm with no  new evidence of disease.  She is seen to consider stereotactic body radiotherapy (SBRT).    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 Clifton James & Dr. Leafy Ro   Severe aortic stenosis    Varicose veins of bilateral lower extremities with other complications        PAST SURGICAL HISTORY: Past Surgical History:  Procedure Laterality Date   BOWEL RESECTION  1970   BRONCHIAL BIOPSY  09/01/2022   Procedure: BRONCHIAL BIOPSIES;  Surgeon: Leslye Peer, MD;  Location: [facility] ENDOSCOPY;  Service: Pulmonary;;   BRONCHIAL BRUSHINGS  09/01/2022   Procedure: BRONCHIAL BRUSHINGS;  Surgeon: Leslye Peer, MD;  Location: [facility] ENDOSCOPY;  Service: Pulmonary;;   BRONCHIAL NEEDLE ASPIRATION BIOPSY  09/01/2022   Procedure: BRONCHIAL NEEDLE ASPIRATION BIOPSIES;  Surgeon: Leslye Peer, MD;  Location: [facility] ENDOSCOPY;  Service: Pulmonary;;   EYE SURGERY Bilateral    cataract   FIDUCIAL MARKER PLACEMENT  09/01/2022   Procedure:  FIDUCIAL MARKER PLACEMENT;  Surgeon: Leslye Peer, MD;  Location: [facility] ENDOSCOPY;  Service: Pulmonary;;   HIP ARTHROPLASTY Left 06/06/2016   Procedure: ARTHROPLASTY BIPOLAR HIP (HEMIARTHROPLASTY);  Surgeon: Vickki Hearing, MD;  Location: [facility] ORS;  Service: Orthopedics;  Laterality: Left;   INTRAOPERATIVE TRANSTHORACIC ECHOCARDIOGRAM N/A 10/07/2022   Procedure: INTRAOPERATIVE TRANSTHORACIC ECHOCARDIOGRAM;  Surgeon: Kathleene Hazel, MD;  Location: [facility] INVASIVE CV LAB;  Service: Open Heart Surgery;  Laterality: N/A;   RIGHT HEART CATH AND CORONARY  ANGIOGRAPHY N/A 07/07/2022   Procedure: RIGHT HEART CATH AND CORONARY ANGIOGRAPHY;  Surgeon: Kathleene Hazel, MD;  Location: [facility] INVASIVE CV LAB;  Service: Cardiovascular;  Laterality: N/A;   TRANSCATHETER AORTIC VALVE REPLACEMENT, TRANSFEMORAL Right 10/07/2022   Procedure: Transcatheter Aortic Valve Replacement, Transfemoral;  Surgeon: Kathleene Hazel, MD;  Location: [facility] INVASIVE CV LAB;  Service: Open Heart Surgery;  Laterality: Right;     FAMILY HISTORY:  Family History  Problem Relation Age of Onset   Stroke Mother    Heart attack Father    Breast cancer Sister    Breast cancer Sister    Bipolar disorder Daughter    Other Son        disabled     SOCIAL HISTORY:  reports that she has quit smoking. Her smoking use included cigarettes. She has a 10 pack-year smoking history. She has never used smokeless tobacco. She reports that she does not drink alcohol and does not use drugs. The patient is widowed and lives in [place].    ALLERGIES: Coconut (cocos nucifera) and Latex   MEDICATIONS:  Current Outpatient Medications  Medication Sig Dispense Refill   acetaminophen (TYLENOL) 500 MG tablet Take 1,000 mg by mouth 2 (two) times daily.     albuterol (VENTOLIN HFA) 108 (90 Base) MCG/ACT inhaler Inhale 2 puffs into the lungs every 4 (four) hours as needed for shortness of breath or wheezing.     ALPRAZolam (XANAX) 0.25 MG tablet Take 0.25 mg by mouth at bedtime.     Ascorbic Acid (VITAMIN C) 1000 MG tablet Take 1,000 mg by mouth daily.     aspirin EC 81 MG tablet Take 81 mg by mouth at bedtime. Swallow whole.     b complex vitamins tablet Take 1 tablet by mouth daily.     Calcium Carb-Cholecalciferol (CALCIUM 600+D) 600-20 MG-MCG TABS Take 1 tablet by mouth daily.     Cholecalciferol (VITAMIN D3) 50 MCG (2000 UT) capsule Take 2,000 Units by mouth daily.     cyanocobalamin (VITAMIN B12) 1000 MCG tablet Take 1,000 mcg by mouth daily as needed (energy).     furosemide  (LASIX) 20 MG tablet Take 20 mg by mouth every Wednesday.     gabapentin (NEURONTIN) 100 MG capsule Take 1 capsule (100 mg total) by mouth 2 (two) times daily.     hydrocortisone cream 1 % Apply 1 Application topically 2 (two) times daily as needed (rash).     Multiple Vitamins-Minerals (HAIR SKIN & NAILS) TABS Take 3 tablets by mouth daily.     Multiple Vitamins-Minerals (MULTIVITAMIN WITH MINERALS) tablet Take 1 tablet by mouth daily.     olmesartan (BENICAR) 40 MG tablet Take 40 mg by mouth daily.     pantoprazole (PROTONIX) 40 MG tablet Take 40 mg by mouth daily.     Psyllium (METAMUCIL PO) Take 1 Dose by mouth daily as needed (constipation). 1 dose = 1 teaspoonful     sertraline (ZOLOFT) 100 MG tablet Take 100 mg  by mouth at bedtime.     simvastatin (ZOCOR) 10 MG tablet Take 10 mg by mouth at bedtime.     solifenacin (VESICARE) 10 MG tablet TAKE (1) TABLET BY MOUTH AT BEDTIME. 30 tablet 11   sulfaSALAzine (AZULFIDINE) 500 MG tablet Take 1 tablet (500 mg total) by mouth 2 (two) times daily. 60 tablet 2   vitamin E 400 UNIT capsule Take 400 Units by mouth daily.     No current facility-administered medications for this encounter.     REVIEW OF SYSTEMS: On review of systems, the patient reports that she is doing well.  She is not experiencing any shortness of breath or chest pain.  She states that she has been taking her blood pressure medication and is not having difficulty with remembering to take this.  No productive cough is noted.  No other complaints are verbalized.     PHYSICAL EXAM:  Wt Readings from Last 3 Encounters:  02/19/23 179 lb 6.4 oz (81.4 kg)  02/12/23 176 lb 12.8 oz (80.2 kg)  02/04/23 180 lb 11.2 oz (82 kg)   Temp Readings from Last 3 Encounters:  02/19/23 97.8 F (36.6 C)  02/04/23 (!) 97.3 F (36.3 C) (Temporal)  10/08/22 98.2 F (36.8 C) (Oral)   BP Readings from Last 3 Encounters:  02/19/23 (!) 170/69  02/12/23 (!) 140/74  02/04/23 (!) 168/57   Pulse  Readings from Last 3 Encounters:  02/19/23 71  02/12/23 72  02/04/23 73   Pain Assessment Pain Score: 1  Pain Loc: Knee/10  In general this is an elderly appearing caucasian female in no acute distress. She's alert and oriented x4 and appropriate throughout the examination. Cardiopulmonary assessment is negative for acute distress and she exhibits normal effort.     ECOG = 0  0 - Asymptomatic (Fully active, able to carry on all predisease activities without restriction)  1 - Symptomatic but completely ambulatory (Restricted in physically strenuous activity but ambulatory and able to carry out work of a light or sedentary nature. For example, light housework, office work)  2 - Symptomatic, <50% in bed during the day (Ambulatory and capable of all self care but unable to carry out any work activities. Up and about more than 50% of waking hours)  3 - Symptomatic, >50% in bed, but not bedbound (Capable of only limited self-care, confined to bed or chair 50% or more of waking hours)  4 - Bedbound (Completely disabled. Cannot carry on any self-care. Totally confined to bed or chair)  5 - Death   Santiago Glad MM, Creech RH, Tormey DC, et al. [phone]). "Toxicity and response criteria of the [facility]". Am. Evlyn Clines. Oncol. 5 (6): 649-55    LABORATORY DATA:  Lab Results  Component Value Date   WBC 7.1 01/27/2023   HGB 11.6 (L) 01/27/2023   HCT 35.4 (L) 01/27/2023   MCV 90.5 01/27/2023   PLT 115 (L) 01/27/2023   Lab Results  Component Value Date   NA 140 01/27/2023   K 3.8 01/27/2023   CL 102 01/27/2023   CO2 33 (H) 01/27/2023   Lab Results  Component Value Date   ALT 8 01/27/2023   AST 12 (L) 01/27/2023   ALKPHOS 51 01/27/2023   BILITOT 0.6 01/27/2023      RADIOGRAPHY: CT Chest W Contrast Result Date: 02/03/2023 CLINICAL DATA:  Follow-up non-small cell lung cancer EXAM: CT CHEST WITH CONTRAST TECHNIQUE: Multidetector CT imaging of the chest was performed  during intravenous  contrast administration. RADIATION DOSE REDUCTION: This exam was performed according to the departmental dose-optimization program which includes automated exposure control, adjustment of the mA and/or kV according to patient size and/or use of iterative reconstruction technique. CONTRAST:  75mL OMNIPAQUE IOHEXOL 300 MG/ML  SOLN COMPARISON:  CT chest dated 11/19/2022 FINDINGS: Cardiovascular: The heart is normal in size. No pericardial effusion. Status post TAVR. No evidence of thoracic aortic aneurysm. Atherosclerotic calcifications of the aortic arch. Moderate three-vessel coronary atherosclerosis. Mediastinum/Nodes: No suspicious mediastinal lymphadenopathy. Visualized thyroid is unremarkable. Lungs/Pleura: 18 x 12 mm nodular opacity in the posterior right upper lobe, corresponding to the patient's known primary bronchogenic carcinoma, with adjacent fiducial marker. No new/suspicious pulmonary nodules. Mild centrilobular and paraseptal emphysematous changes, upper lung predominant. Mild subpleural reticulation/fibrosis in the lungs bilaterally, lower lobe predominant. No focal consolidation. No pleural effusion or pneumothorax. Upper Abdomen: Visualized upper abdomen is grossly unremarkable, noting vascular calcifications. Musculoskeletal: Degenerative changes of the visualized thoracolumbar spine. IMPRESSION: 18 mm nodular opacity in the posterior right upper lobe, corresponding to the patient's known primary bronchogenic carcinoma, with adjacent fiducial marker. No evidence of metastatic disease. Aortic Atherosclerosis (ICD10-I70.0) and Emphysema (ICD10-J43.9). Electronically Signed   By: Charline Bills M.D.   On: 02/03/2023 21:03       IMPRESSION/PLAN: 1. Putative Stage IA2, cT1bN0M0, NSCLC of the RUL. Dr. Mitzi Hansen discusses the pathology findings and reviews the nature of early stage lung cancer.  Dr. Mitzi Hansen reviews that the standard of care is for surgical resection. However for  patients who are not medical candidates to undergo surgery, or who choose to forgo surgery, stereotactic body radiotherapy (SBRT) is an appropriate alternative.  The patient is not felt to be a good surgical candidate given her comorbidities. We discussed the risks, benefits, short, and long term effects of radiotherapy, as well as the curative intent, and the patient is interested in proceeding. Dr. Mitzi Hansen discusses the delivery and logistics of radiotherapy and anticipates a course of 3-5 fractions of SBRT treatment. Written consent is obtained and placed in the chart, a copy was provided to the patient. The patient will be contacted to coordinate treatment planning by our simulation department.   2. Financial constraints.  The patient verbalized inability to pay for rodent mitigation in her home.  We discussed that we would reach out to social work to see if there are any resources that can help alleviate or improve the ability of her to take care of this problem.   In a visit lasting 60 minutes, greater than 50% of the time was spent face to face discussing the patient's condition, in preparation for the discussion, and coordinating the patient's care.   The above documentation reflects my direct findings during this shared patient visit. Please see the separate note by Dr. Mitzi Hansen on this date for the remainder of the patient's plan of care.    Osker Mason, [facility]   **Disclaimer: This note was dictated with voice recognition software. Similar sounding words can inadvertently be transcribed and this note may contain transcription errors which may not have been corrected upon publication of note.**

## 2023-02-20 ENCOUNTER — Inpatient Hospital Stay: Payer: Medicare Other | Admitting: Licensed Clinical Social Worker

## 2023-02-20 DIAGNOSIS — C3490 Malignant neoplasm of unspecified part of unspecified bronchus or lung: Secondary | ICD-10-CM

## 2023-02-20 NOTE — Progress Notes (Signed)
[facility] Clinical Social Work  Initial Assessment   Barbara Lucero is a 82 y.o. year old female contacted by phone. Clinical Social Work was referred by medical provider for assessment of psychosocial needs.   SDOH (Social Determinants of Health) assessments performed: Yes SDOH Interventions    Flowsheet Row CARDIAC REHAB PHASE II ORIENTATION from 11/10/2022 in [facility]  SDOH Interventions   Depression Interventions/Treatment  Medication, Currently on Treatment       SDOH Screenings   Food Insecurity: No Food Insecurity (02/19/2023)  Housing: Unknown (02/19/2023)  Transportation Needs: No Transportation Needs (02/19/2023)  Utilities: Not At Risk (02/19/2023)  Depression (PHQ2-9): Low Risk  (02/19/2023)  Recent Concern: Depression (PHQ2-9) - Medium Risk (12/25/2022)  Tobacco Use: Medium Risk (02/13/2023)     Distress Screen completed: No     No data to display            Family/Social Information:  Housing Arrangement: patient lives with her daughter Luster Landsberg.   Pt reports she is independent in ADLs, utilizing a walker for ambulation.  Pt has a transport wheelchair for distance. Family members/support persons in your life? Pt's primary source of support is her daughter. Transportation concerns: no  Employment: Retired .  Income source: Actor concerns: Yes, current concerns Type of concern: Utilities and Rent/ mortgage Food access concerns: no Religious or spiritual practice: Data processing manager Currently in place:  none  Coping/ Adjustment to diagnosis: Patient understands treatment plan and what happens next? yes Concerns about diagnosis and/or treatment: Quality of life Patient reported stressors: Finances Hopes and/or priorities: Pt's priority is to start treatment w/ the hope of positive results. Patient enjoys  not addressed Current coping skills/ strengths: Capable of independent living , Motivation for  treatment/growth , and Supportive family/friends     SUMMARY: Current SDOH Barriers:  Financial constraints related to fixed income.  Clinical Social Work Clinical Goal(s):  Explore community resource options for unmet needs related to:  Financial Strain   Interventions: Discussed common feeling and emotions when being diagnosed with cancer, and the importance of support during treatment Informed patient of the support team roles and support services at [facility] Provided CSW contact information and encouraged patient to call with any questions or concerns Referred patient to the financial resource specialist to apply for the [employer].  Pt  also reports having mice in her home.  Pt has put out mouse traps, but asked about pest control.  CSW recommended calling [employer] for guidance and to call the [facility] to see if there are resources for seniors that may be less expensive.  Pt is the home owner.     Follow Up Plan: Patient will contact CSW with any support or resource needs Patient verbalizes understanding of plan: Yes    Rachel Moulds, LCSW Clinical Social Worker [facility]

## 2023-02-20 NOTE — Progress Notes (Unsigned)
HEART AND VASCULAR CENTER   MULTIDISCIPLINARY HEART VALVE TEAM  Structural Heart Office Note:  .   Date:  02/23/2023  ID:  Barbara Lucero, DOB Dec 12, 1940, MRN [id number] PCP: [name], MD  [facility] Providers Cardiologist:  Charlton Haws, MD  Dr. Clifton James, MD/Dr. Leafy Ro, MD (TAVR)  History of Present Illness: .    Barbara Lucero is a 82 y.o. female with a history of anxiety, depression, Crohn's disease, prior DVT, HTN, HLD and severe aortic stenosis s/p TAVR 10/07/22 and is here today for one month follow up.   [name] had an echocardiogram 04/2022 that showed LVEF 60-65% and severe aortic stenosis with calcified, thickened leaflets, mean gradient 45.7 mmHg, peak gradient 78.5 mmHg, AVA 0.64 cm2, DI 0.20. She was referred to Dr. Pearletha Alfred 06/23/22 to be considered for TAVR. R/LHC showed nonobstructive CAD. Pre TAVR CTs showed anatomy amendable for a 23 mm Edwards S3UR via the right TF approach. It also showed an enlarging pulmonary nodule highly suspicious for malignancy. She was evaluated by Dr. Delton Coombes and underwent navigational bronchoscopy on 09/01/22 with results suspicious for slow growing malignancy. 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 Not felt to be a good resection candidate therefore plan for radiation oncology referral per Dr. Arbutus Ped for consideration of SBRT after TAVR completed.   She underwent successful TAVR with a 23 mm Edwards Sapien 3 THV via the TF approach on 10/07/22. Post operative echo with normal LV function, mean gradient at , peak , AVA by VTI at 1.59cm2, and DI of 0.56.  She was restarted on ASA 81mg  monotherapy. She had some difficulty with hypoxia and was discharged on supplemental oxygen. No longer needed now   Getting marked for her radiation next week  .       Physical Exam:    VS:  BP (!) 142/80   Pulse 63   Ht 5\' 2"  (1.575 m)   Wt 176 lb 3.2 oz (79.9 kg)   SpO2 95%   BMI 32.23 kg/m     Wt Readings from Last 3 Encounters:  02/23/23 176 lb 3.2 oz (79.9 kg)  02/19/23 179 lb 6.4 oz (81.4 kg)  02/12/23 176 lb 12.8 oz (80.2 kg)    General: Well developed, well nourished, NAD Skin: Warm, dry, intact  Cardiovascular: RRR with S1 S2. No murmurs Extremities: No edema.  Neuro: Alert and oriented. No focal deficits. No facial asymmetry. MAE spontaneously. Psych: Responds to questions appropriately with normal affect.    ASSESSMENT AND PLAN: .    Severe AS: Patient doing well with NYHA class I symptoms s/p successful TAVR with a 23 mm Edwards Sapien 3 10/07/22. Echo 11/10/22  with hyperdynamic EF with increased gradient on echo 11/10/22  at when compared to post op echo ( ), peak 48.69mmHg, and AVA by VTI at 1.35cm2 with moderate AR. Restarted on home ASA 81mg  daily. CTA 11/19/22 showed trivial amount of HALT and well placed/expanded TAVR valve and no indication to start anticoagulation       Hypoxia: Improved today and no longer requiring supplemental O2.    HTN: WNL today with no changes needed at this time.   HLD: Continue statin    Previous DVT: No longer on OAC.    Pulmonary nodule: Pre TAVR CT imaging with suspicious lung nodule. Underwent navigational bronch 09/01/22 with results suspicious for slow growing malignancy. 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 Not  felt to be a good resection candidate therefore plan for radiation oncology referral per Dr. Arbutus Ped for consideration of SBRT. CT for post TAVR stable 1.5 cm RUL nodule slightly decreased in size Has seen Dr Mitzi Hansen 02/19/23 with plans to proceed with  XRT for stage one cancer   Signed, Charlton Haws, MD

## 2023-02-23 ENCOUNTER — Ambulatory Visit: Payer: Medicare Other | Attending: Cardiovascular Disease | Admitting: Cardiovascular Disease

## 2023-02-23 ENCOUNTER — Encounter: Payer: Self-pay | Admitting: Cardiovascular Disease

## 2023-02-23 VITALS — BP 160/78 | HR 63 | Ht 62.0 in | Wt 176.2 lb

## 2023-02-23 DIAGNOSIS — I2510 Atherosclerotic heart disease of native coronary artery without angina pectoris: Secondary | ICD-10-CM | POA: Diagnosis not present

## 2023-02-23 DIAGNOSIS — I10 Essential (primary) hypertension: Secondary | ICD-10-CM | POA: Diagnosis not present

## 2023-02-23 DIAGNOSIS — Z952 Presence of prosthetic heart valve: Secondary | ICD-10-CM

## 2023-02-23 NOTE — Patient Instructions (Signed)
Medication Instructions:  Your physician recommends that you continue on your current medications as directed. Please refer to the Current Medication list given to you today.  *If you need a refill on your cardiac medications before your next appointment, please call your pharmacy*   Lab Work: NONE   If you have labs (blood work) drawn today and your tests are completely normal, you will receive your results only by: MyChart Message (if you have MyChart) OR A paper copy in the mail If you have any lab test that is abnormal or we need to change your treatment, we will call you to review the results.   Testing/Procedures: NONE    Follow-Up: At [facility], you and your health needs are our priority.  As part of our continuing mission to provide you with exceptional heart care, we have created designated Provider Care Teams.  These Care Teams include your primary Cardiologist (physician) and Advanced Practice Providers (APPs -  Physician Assistants and Nurse Practitioners) who all work together to provide you with the care you need, when you need it.  We recommend signing up for the patient portal called "MyChart".  Sign up information is provided on this After Visit Summary.  MyChart is used to connect with patients for Virtual Visits (Telemedicine).  Patients are able to view lab/test results, encounter notes, upcoming appointments, etc.  Non-urgent messages can be sent to your provider as well.   To learn more about what you can do with MyChart, go to [email or web address].    Your next appointment:   6 month(s)  Provider:   You may see Peter Nishan, MD or one of the following Advanced Practice Providers on your designated Care Team:   Brittany Strader, PA-C  Michele Lenze, PA-C     Other Instructions Thank you for choosing [facility]!    

## 2023-02-24 ENCOUNTER — Encounter (HOSPITAL_COMMUNITY)
Admission: RE | Admit: 2023-02-24 | Discharge: 2023-02-24 | Disposition: A | Discharge status: Home / Self Care | Payer: Medicare Other | Source: Ambulatory Visit | Attending: Cardiovascular Disease

## 2023-02-24 ENCOUNTER — Encounter (HOSPITAL_COMMUNITY): Payer: Self-pay | Admitting: *Deleted

## 2023-02-24 DIAGNOSIS — Z952 Presence of prosthetic heart valve: Secondary | ICD-10-CM

## 2023-02-24 NOTE — Progress Notes (Signed)
 Daily Session Note  Patient Details  Name: Barbara Lucero MRN: [id number] Date of Birth: January 12, 1941 Referring Provider:   Flowsheet Row CARDIAC REHAB PHASE II ORIENTATION from 11/10/2022 in [facility]AC [facility]  Referring Provider Dr. Verlin       Encounter Date: 02/24/2023  Check In:  Session Check In - 02/24/23 1500       Check-In   Supervising physician immediately available to respond to emergencies See telemetry face sheet for immediately available MD    Location [facility]    Staff Present Powell Benders, BS, Exercise Physiologist;Danny Siri, RN, BSN    Virtual Visit No    Medication changes reported     No    Fall or balance concerns reported    No    Tobacco Cessation No Change    Warm-up and Cool-down Performed on first and last piece of equipment    Resistance Training Performed Yes    VAD Patient? No    PAD/SET Patient? No      Pain Assessment   Currently in Pain? No/denies    Pain Score 0-No pain    Multiple Pain Sites No             Capillary Blood Glucose: No results found for this or any previous visit (from the past 24 hours).    Social History   Tobacco Use  Smoking Status Former   Current packs/day: 0.50   Average packs/day: 0.5 packs/day for 20.0 years (10.0 ttl pk-yrs)   Types: Cigarettes  Smokeless Tobacco Never    Goals Met:  Independence with exercise equipment Exercise tolerated well No report of concerns or symptoms today Strength training completed today  Goals Unmet:  Not Applicable  Comments: Pt able to follow exercise prescription today without complaint.  Will continue to monitor for progression.

## 2023-02-24 NOTE — Progress Notes (Signed)
 Cardiac Individual Treatment Plan  Patient Details  Name: Barbara Lucero MRN: [id number] Date of Birth: 1940/10/30 Referring Provider:   Flowsheet Row CARDIAC REHAB PHASE II ORIENTATION from 11/10/2022 in [facility] CARDIAC REHABILITATION  Referring Provider Dr. Verlin       Initial Encounter Date:  Flowsheet Row CARDIAC REHAB PHASE II ORIENTATION from 11/10/2022 in [place] [facility] CARDIAC REHABILITATION  Date 11/10/22       Visit Diagnosis: S/P TAVR (transcatheter aortic valve replacement)  Patient's Home Medications on Admission:  Current Outpatient Medications:    acetaminophen  (TYLENOL ) 500 MG tablet, Take 1,000 mg by mouth 2 (two) times daily., Disp: , Rfl:    albuterol  (VENTOLIN  HFA) 108 (90 Base) MCG/ACT inhaler, Inhale 2 puffs into the lungs every 4 (four) hours as needed for shortness of breath or wheezing., Disp: , Rfl:    ALPRAZolam  (XANAX ) 0.25 MG tablet, Take 0.25 mg by mouth at bedtime., Disp: , Rfl:    Ascorbic Acid (VITAMIN C) 1000 MG tablet, Take 1,000 mg by mouth daily., Disp: , Rfl:    aspirin  EC 81 MG tablet, Take 81 mg by mouth at bedtime. Swallow whole., Disp: , Rfl:    b complex vitamins tablet, Take 1 tablet by mouth daily., Disp: , Rfl:    Calcium Carb-Cholecalciferol (CALCIUM 600+D) 600-20 MG-MCG TABS, Take 1 tablet by mouth daily., Disp: , Rfl:    Cholecalciferol (VITAMIN D3) 50 MCG (2000 UT) capsule, Take 2,000 Units by mouth daily., Disp: , Rfl:    cyanocobalamin  (VITAMIN B12) 1000 MCG tablet, Take 1,000 mcg by mouth daily as needed (energy)., Disp: , Rfl:    furosemide  (LASIX ) 20 MG tablet, Take 20 mg by mouth every Wednesday., Disp: , Rfl:    gabapentin  (NEURONTIN ) 100 MG capsule, Take 1 capsule (100 mg total) by mouth 2 (two) times daily., Disp: , Rfl:    hydrocortisone cream 1 %, Apply 1 Application topically 2 (two) times daily as needed (rash)., Disp: , Rfl:    Multiple Vitamins-Minerals (HAIR SKIN & NAILS) TABS, Take 3 tablets by mouth daily., Disp: ,  Rfl:    Multiple Vitamins-Minerals (MULTIVITAMIN WITH MINERALS) tablet, Take 1 tablet by mouth daily., Disp: , Rfl:    olmesartan (BENICAR) 40 MG tablet, Take 40 mg by mouth daily., Disp: , Rfl:    pantoprazole  (PROTONIX ) 40 MG tablet, Take 40 mg by mouth daily., Disp: , Rfl:    Psyllium (METAMUCIL PO), Take 1 Dose by mouth daily as needed (constipation). 1 dose = 1 teaspoonful, Disp: , Rfl:    sertraline  (ZOLOFT ) 100 MG tablet, Take 100 mg by mouth at bedtime., Disp: , Rfl:    simvastatin  (ZOCOR ) 10 MG tablet, Take 10 mg by mouth at bedtime., Disp: , Rfl:    solifenacin  (VESICARE ) 10 MG tablet, TAKE (1) TABLET BY MOUTH AT BEDTIME., Disp: 30 tablet, Rfl: 11   sulfaSALAzine  (AZULFIDINE ) 500 MG tablet, Take 1 tablet (500 mg total) by mouth 2 (two) times daily., Disp: 60 tablet, Rfl: 2   vitamin E 400 UNIT capsule, Take 400 Units by mouth daily., Disp: , Rfl:   Past Medical History: Past Medical History:  Diagnosis Date   Anxiety    Arthritis    Cancer (HCC)    face- removed in office   Crohn disease (HCC)    Depression    DVT (deep venous thrombosis) (HCC) 07/27/2017   LLE   GERD (gastroesophageal reflux disease)    HTN (hypertension)    Hyperlipidemia    Neuropathy  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 Verlin & Dr. Maryjane   Severe aortic stenosis    Varicose veins of bilateral lower extremities with other complications     Tobacco Use: Social History   Tobacco Use  Smoking Status Former   Current packs/day: 0.50   Average packs/day: 0.5 packs/day for 20.0 years (10.0 ttl pk-yrs)   Types: Cigarettes  Smokeless Tobacco Never    Labs: Review Flowsheet       Latest Ref Rng & Units 11/22/2021 07/07/2022 10/07/2022  Labs for ITP Cardiac and Pulmonary Rehab  PH, Arterial 7.35 - 7.45 - 7.300  -  PCO2 arterial 32 - 48 mmHg - 54.1  -  Bicarbonate 20.0 - 28.0 mmol/L - 26.6  29.0  -  TCO2 22 -  32 mmol/L 30  28  31  26    O2 Saturation % - 94  74  -    Details       Multiple values from one day are sorted in reverse-chronological order         Capillary Blood Glucose: Lab Results  Component Value Date   GLUCAP 87 09/26/2022   GLUCAP 112 (H) 11/22/2021   GLUCAP 124 (H) 06/06/2016   GLUCAP 125 (H) 06/06/2016     Exercise Target Goals: Exercise Program Goal: Individual exercise prescription set using results from initial 6 min walk test and THRR while considering  patient's activity barriers and safety.   Exercise Prescription Goal: Starting with aerobic activity 30 plus minutes a day, 3 days per week for initial exercise prescription. Provide home exercise prescription and guidelines that participant acknowledges understanding prior to discharge.  Activity Barriers & Risk Stratification:  Activity Barriers & Cardiac Risk Stratification - 11/10/22 1424       Activity Barriers & Cardiac Risk Stratification   Activity Barriers Assistive Device;History of Falls;Balance Concerns;Deconditioning;Left Knee Replacement;Back Problems;Arthritis    Cardiac Risk Stratification Moderate             6 Minute Walk:  6 Minute Walk     Row Name 11/10/22 1539         6 Minute Walk   Phase Initial     Distance 570 feet     Walk Time 6 minutes     # of Rest Breaks 1     MPH 1.07     METS 0.92     RPE 13     VO2 Peak 3.22     Symptoms Yes (comment)     Comments pt used standing walker when doing walk test     Resting HR 62 bpm     Resting BP 110/60     Resting Oxygen  Saturation  94 %     Exercise Oxygen  Saturation  during 6 min walk 89 %     Max Ex. HR 102 bpm     Max Ex. BP 160/68     2 Minute Post BP 120/60              Oxygen  Initial Assessment:   Oxygen  Re-Evaluation:   Oxygen  Discharge (Final Oxygen  Re-Evaluation):   Initial Exercise Prescription:  Initial Exercise Prescription - 11/10/22 1500       Date of Initial Exercise RX and  Referring Provider   Date 11/10/22    Referring Provider Dr. Verlin      Oxygen    Maintain Oxygen  Saturation 88% or higher  NuStep   Level 1    SPM 80    Minutes 15      Arm Ergometer   Level 1    RPM 30    Minutes 15      Prescription Details   Frequency (times per week) 3    Duration Progress to 30 minutes of continuous aerobic without signs/symptoms of physical distress      Intensity   THRR 40-80% of Max Heartrate 93-124    Ratings of Perceived Exertion 11-13    Perceived Dyspnea 0-4      Resistance Training   Training Prescription Yes    Weight 2    Reps 10-15             Perform Capillary Blood Glucose checks as needed.  Exercise Prescription Changes:   Exercise Prescription Changes     Row Name 12/11/22 1500 12/25/22 1500           Response to Exercise   Blood Pressure (Admit) 118/70 122/60      Blood Pressure (Exercise) 110/70 126/64      Blood Pressure (Exit) 120/64 118/60      Heart Rate (Admit) 73 bpm 67 bpm      Heart Rate (Exercise) 106 bpm 121 bpm      Heart Rate (Exit) 77 bpm 73 bpm      Rating of Perceived Exertion (Exercise) 13 12      Duration Continue with 30 min of aerobic exercise without signs/symptoms of physical distress. Continue with 30 min of aerobic exercise without signs/symptoms of physical distress.      Intensity THRR unchanged THRR unchanged        Progression   Progression Continue to progress workloads to maintain intensity without signs/symptoms of physical distress. Continue to progress workloads to maintain intensity without signs/symptoms of physical distress.        Resistance Training   Training Prescription Yes Yes      Weight 2 2 lbs / orange band      Reps 10-15 10-15        NuStep   Level 2 2      SPM 77 85      Minutes 15 15      METs 2 2.4        Arm Ergometer   Level 1.5 1      RPM 91 75      Minutes 15 15      METs 2 2.1        Oxygen    Maintain Oxygen  Saturation 88% or higher 88% or  higher               Exercise Comments:   Exercise Goals and Review:   Exercise Goals     Row Name 11/10/22 1542             Exercise Goals   Increase Physical Activity Yes       Intervention Provide advice, education, support and counseling about physical activity/exercise needs.;Develop an individualized exercise prescription for aerobic and resistive training based on initial evaluation findings, risk stratification, comorbidities and participant's personal goals.       Expected Outcomes Short Term: Attend rehab on a regular basis to increase amount of physical activity.;Long Term: Add in home exercise to make exercise part of routine and to increase amount of physical activity.;Long Term: Exercising regularly at least 3-5 days a week.       Increase Strength and Stamina Yes  Intervention Provide advice, education, support and counseling about physical activity/exercise needs.;Develop an individualized exercise prescription for aerobic and resistive training based on initial evaluation findings, risk stratification, comorbidities and participant's personal goals.       Expected Outcomes Short Term: Increase workloads from initial exercise prescription for resistance, speed, and METs.;Short Term: Perform resistance training exercises routinely during rehab and add in resistance training at home;Long Term: Improve cardiorespiratory fitness, muscular endurance and strength as measured by increased METs and functional capacity ( )       Able to understand and use rate of perceived exertion (RPE) scale Yes       Intervention Provide education and explanation on how to use RPE scale       Expected Outcomes Short Term: Able to use RPE daily in rehab to express subjective intensity level;Long Term:  Able to use RPE to guide intensity level when exercising independently       Knowledge and understanding of Target Heart Rate Range (THRR) Yes       Intervention Provide education and  explanation of THRR including how the numbers were predicted and where they are located for reference       Expected Outcomes Short Term: Able to state/look up THRR;Long Term: Able to use THRR to govern intensity when exercising independently;Short Term: Able to use daily as guideline for intensity in rehab       Able to check pulse independently Yes       Intervention Provide education and demonstration on how to check pulse in carotid and radial arteries.;Review the importance of being able to check your own pulse for safety during independent exercise       Expected Outcomes Short Term: Able to explain why pulse checking is important during independent exercise       Understanding of Exercise Prescription Yes       Intervention Provide education, explanation, and written materials on patient's individual exercise prescription       Expected Outcomes Short Term: Able to explain program exercise prescription;Long Term: Able to explain home exercise prescription to exercise independently                Exercise Goals Re-Evaluation :  Exercise Goals Re-Evaluation     Row Name 12/02/22 1448 12/25/22 1521 01/27/23 1520         Exercise Goal Re-Evaluation   Exercise Goals Review Increase Physical Activity;Increase Strength and Stamina;Understanding of Exercise Prescription Increase Physical Activity;Increase Strength and Stamina;Understanding of Exercise Prescription Increase Strength and Stamina;Improve claudication pain tolerance and improve walking ability;Increase Physical Activity     Comments Barbara Lucero is doing well in rehab. She was not here for a week due to her daughter not feeling well and that is her transportation. She still has her normal aches and pains in her knees and back. She stated that after she exercises she feels a little bit more energized. She has a pedel at home where she can do both her legs and also her arms. She does go to [employer] once in a while and does seated  exercise. Barbara Lucero is doing well when she comes to rehab. She has transpertation with her daurghter and comes about once a week. She is planning on going to the [employer] with her daughter to exercise as well/, She stated that she does not feel like shes improved much but since she is not coming to rehab regularly that would match up. Barbara Lucero is doing well when she comes to rehab. She is  dependent on transpertation from her daughter and is unreliable to come twice a week. She is still wanting to go to the [employer] with her daughter. She stated that she has been decorating for Christmas and has been needing to take breaks here and there due to fatigue.     Expected Outcomes Short term: increase levels on the Nustep   long term: continue to exercise at rehab and home Short: attend rehab regulaly basis long term: [facility]se [facility] or [employer] Short: attend rehab regulaly basis long term: exericse at home or [employer]               Discharge Exercise Prescription (Final Exercise Prescription Changes):  Exercise Prescription Changes - 12/25/22 1500       Response to Exercise   Blood Pressure (Admit) 122/60    Blood Pressure (Exercise) 126/64    Blood Pressure (Exit) 118/60    Heart Rate (Admit) 67 bpm    Heart Rate (Exercise) 121 bpm    Heart Rate (Exit) 73 bpm    Rating of Perceived Exertion (Exercise) 12    Duration Continue with 30 min of aerobic exercise without signs/symptoms of physical distress.    Intensity THRR unchanged      Progression   Progression Continue to progress workloads to maintain intensity without signs/symptoms of physical distress.      Resistance Training   Training Prescription Yes    Weight 2 lbs / orange band    Reps 10-15      NuStep   Level 2    SPM 85    Minutes 15    METs 2.4      Arm Ergometer   Level 1    RPM 75    Minutes 15    METs 2.1      Oxygen    Maintain Oxygen  Saturation 88% or higher             Nutrition:  Target Goals: Understanding of  nutrition guidelines, daily intake of sodium 1500mg , cholesterol 200mg , calories 30% from fat and 7% or less from saturated fats, daily to have 5 or more servings of fruits and vegetables.  Biometrics:  Pre Biometrics - 11/10/22 1543       Pre Biometrics   Height 5' 1 (1.549 m)    Weight 176 lb 9.4 oz (80.1 kg)    Waist Circumference 43 inches    Hip Circumference 45 inches    Waist to Hip Ratio 0.96 %    BMI (Calculated) 33.38    Grip Strength 17.1 kg              Nutrition Therapy Plan and Nutrition Goals:   Nutrition Assessments:  MEDIFICTS Score Key: >=70 Need to make dietary changes  40-70 Heart Healthy Diet <= 40 Therapeutic Level Cholesterol Diet  Flowsheet Row CARDIAC REHAB PHASE II ORIENTATION from 11/10/2022 in [facility] CARDIAC REHABILITATION  Picture Your Plate Total Score on Admission 26      Picture Your Plate Scores: <59 Unhealthy dietary pattern with much room for improvement. 41-50 Dietary pattern unlikely to meet recommendations for good health and room for improvement. 51-60 More healthful dietary pattern, with some room for improvement.  >60 Healthy dietary pattern, although there may be some specific behaviors that could be improved.    Nutrition Goals Re-Evaluation:  Nutrition Goals Re-Evaluation     Row Name 12/02/22 1505 12/25/22 1527 01/27/23 1537         Goals   Nutrition Goal  Healthy heathy Healthy heathy Healthy heathy     Comment Barbara Lucero stated that seh gained a few pounds and said she love ice cream. She eats a ice cream cone about everyday. She does try to eat mostly veggies and she eats a good amount of chicken. She does et smaller portions for meals and does not eat fried food due it hurting her stomach. She does cook most of her meals. She does like to go out once in a while and does choose healthy options. Barbara Lucero stated that she does eat larger portions and that she loves sweets and ice cream. She is trying to pick healthier  options when eating and sticks to chicken, salmon and some ground beef. SHe does like salads. She does not drink soda and only drinks one cup coffee in the mornings. Barbara Lucero still contonues to eat larger portion meals but has been trying to cut back. She loves sweets and ice cream. She continues to try to pick healthier options and likes chicken and salmon and does eat some ground beef each week. She continues to only drink water  and one cup of coffee during the day.     Expected Outcome Short term: try to cut back on ice cream throught the weeek    long term: contiue to pick healthy options for meal Short term: try to cut back on sweets and choice smaller portion control   long term: contiue to pick healthy options for meal Short term: try to cut back on sweets and choice smaller portion control   long term: contiue to pick healthy options for meal              Nutrition Goals Discharge (Final Nutrition Goals Re-Evaluation):  Nutrition Goals Re-Evaluation - 01/27/23 1537       Goals   Nutrition Goal Healthy heathy    Comment Barbara Lucero still contonues to eat larger portion meals but has been trying to cut back. She loves sweets and ice cream. She continues to try to pick healthier options and likes chicken and salmon and does eat some ground beef each week. She continues to only drink water  and one cup of coffee during the day.    Expected Outcome Short term: try to cut back on sweets and choice smaller portion control   long term: contiue to pick healthy options for meal             Psychosocial: Target Goals: Acknowledge presence or absence of significant depression and/or stress, maximize coping skills, provide positive support system. Participant is able to verbalize types and ability to use techniques and skills needed for reducing stress and depression.  Initial Review & Psychosocial Screening:  Initial Psych Review & Screening - 11/10/22 1518       Initial Review   Current issues  with Current Anxiety/Panic;Current Psychotropic Meds;History of Depression;Current Depression;Current Stress Concerns;Current Sleep Concerns    Source of Stress Concerns Family      Family Dynamics   Good Support System? Yes      Barriers   Psychosocial barriers to participate in program There are no identifiable barriers or psychosocial needs.;The patient should benefit from training in stress management and relaxation.      Screening Interventions   Interventions Encouraged to exercise;To provide support and resources with identified psychosocial needs;Provide feedback about the scores to participant    Expected Outcomes Short Term goal: Utilizing psychosocial counselor, staff and physician to assist with identification of specific Stressors or current issues interfering with  healing process. Setting desired goal for each stressor or current issue identified.;Long Term Goal: Stressors or current issues are controlled or eliminated.;Short Term goal: Identification and review with participant of any Quality of Life or Depression concerns found by scoring the questionnaire.;Long Term goal: The participant improves quality of Life and PHQ9 Scores as seen by post scores and/or verbalization of changes             Quality of Life Scores:  Quality of Life - 11/10/22 1551       Quality of Life   Select Quality of Life      Quality of Life Scores   Health/Function Pre 23.18 %    Socioeconomic Pre 25.43 %    Psych/Spiritual Pre 21.86 %    Family Pre 24 %    GLOBAL Pre 23.48 %            Scores of 19 and below usually indicate a poorer quality of life in these areas.  A difference of  2-3 points is a clinically meaningful difference.  A difference of 2-3 points in the total score of the Quality of Life Index has been associated with significant improvement in overall quality of life, self-image, physical symptoms, and general health in studies assessing change in quality of  life.  PHQ-9: Review Flowsheet       02/19/2023 12/25/2022 11/10/2022  Depression screen PHQ 2/9  Decreased Interest 0 2 3  Down, Depressed, Hopeless 1 2 3   PHQ - 2 Score 1 4 6   Altered sleeping - 2 3  Tired, decreased energy - 2 3  Change in appetite - 1 2  Feeling bad or failure about yourself  - 0 0  Trouble concentrating - 0 1  Moving slowly or fidgety/restless - 1 1  Suicidal thoughts - 0 0  PHQ-9 Score - 10 16  Difficult doing work/chores - Somewhat difficult Somewhat difficult   Interpretation of Total Score  Total Score Depression Severity:  1-4 = Minimal depression, 5-9 = Mild depression, 10-14 = Moderate depression, 15-19 = Moderately severe depression, 20-27 = Severe depression   Psychosocial Evaluation and Intervention:  Psychosocial Evaluation - 11/10/22 1523       Psychosocial Evaluation & Interventions   Interventions Stress management education;Relaxation education;Encouraged to exercise with the program and follow exercise prescription    Comments Patient was referred to [facility] with TAVR. Her PHQ-9 score was 16. She is currently being treated for depression and anxiety with Zoloft . 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 She lives with her daughter who is bi-polar. She does support her some providing transportation. Her sister is her main support person and some friends at her church.  She says she has trouble staying asleep which she takes Alprazolam  for which helps. She sleeps in a recliner. She says she slept by her son's bed in a recliner for so many years she just has not slept in her bed and is more comfortable in the recliner. Her main stressor in her life currently is her 59 year old granddaughter left home 2 years ago and has not contacted anyone in her family. She says she worries about her grandaugther everyday and can not get her off of her mind. She says she saw a counselor at her  chruch for a few times after her son died but stopped going. She does not feel like she needs to  see a veterinary surgeon. She feels her depression and anxiety are managed okay with medication. Her main goals for the program are to improve her overall mobility, improve her strength and stamina and get healthier overall. She has no barriers identified to participate in the program.    Expected Outcomes Short Term: attend the program consistently. Long term: meet her personal goals.    Continue Psychosocial Services  Follow up required by staff             Psychosocial Re-Evaluation:  Psychosocial Re-Evaluation     Row Name 12/02/22 1456 12/25/22 1525 01/27/23 1528         Psychosocial Re-Evaluation   Current issues with History of Depression;Current Depression;Current Sleep Concerns;Current Stress Concerns History of Depression;Current Depression;Current Sleep Concerns;Current Stress Concerns History of Depression;Current Depression;Current Sleep Concerns;Current Stress Concerns     Comments Barbara Lucero is doing well in rehab. She is currently taking Zoloft  for depression and anxiety. She lives with her daughter who is bi-polar and she does support her by providing trasportation. She stated that she will sleep half the night and then wake up and can not get back to sleep. She still has stress about her granddaughter levaving home over a year ago and has not contacted the family. This is her main stressor and she does not have a way to contact her. Barbara Lucero is doing okay in rehab when she comes. She is still taking Zoloft  fir depression and anixety. She depends on her daughter for transpertation and it does make it hard for her due to being bi-polar and not knowoing how she will feel. She stays worried about her granddaughter when left home over a year ago and they have not contacted the family. This continues to be her main stressor Barbara Lucero is okay with rehab when she comes. She is still taking Zolof for depression  and anixety. She is dependent of her daughter for transpertation and it is making it hard for her to come to rehab regulary. She coninues to worry about her granddaughter who left home over a year ago and has not contacted the family.This continues to be her main stressor in life her family. She goes to her [facility] small group to find friends and joy.     Expected Outcomes Short term: continue to pray about granddaughter to help fing relef in stress.   long term: continue to find joy and be positive Short term: continue to pray about granddaughter to help fing relef in stress.   long term: continue to find joy and be positive Short term: continue to pray about granddaughter to help fing relef in stress.   long term: continue to find joy and be positive     Interventions Stress management education;Relaxation education;Encouraged to attend Cardiac Rehabilitation for the exercise -- Stress management education;Relaxation education;Encouraged to attend Cardiac Rehabilitation for the exercise     Continue Psychosocial Services  Follow up required by staff Follow up required by staff Follow up required by staff       Initial Review   Source of Stress Concerns -- -- Family              Psychosocial Discharge (Final Psychosocial Re-Evaluation):  Psychosocial Re-Evaluation - 01/27/23 1528       Psychosocial Re-Evaluation   Current issues with History of Depression;Current Depression;Current Sleep Concerns;Current Stress Concerns    Comments Barbara Lucero is okay with rehab when she comes. 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 She is dependent of  her daughter for transpertation and it is making it hard for her to come to rehab regulary. She coninues to worry about her granddaughter who left home over a year ago and has not contacted the family.This continues to be her main stressor in life her family. She goes to her OBC small group to find friends and joy.    Expected Outcomes Short term: continue  to pray about granddaughter to help fing relef in stress.   long term: continue to find joy and be positive    Interventions Stress management education;Relaxation education;Encouraged to attend Cardiac Rehabilitation for the exercise    Continue Psychosocial Services  Follow up required by staff      Initial Review   Source of Stress Concerns Family             Vocational Rehabilitation: Provide vocational rehab assistance to qualifying candidates.   Vocational Rehab Evaluation & Intervention:  Vocational Rehab - 11/10/22 1433       Initial Vocational Rehab Evaluation & Intervention   Assessment shows need for Vocational Rehabilitation No      Vocational Rehab Re-Evaulation   Comments Patient is retired.             Education: Education Goals: Education classes will be provided on a weekly basis, covering required topics. Participant will state understanding/return demonstration of topics presented.  Learning Barriers/Preferences:  Learning Barriers/Preferences - 11/10/22 1433       Learning Barriers/Preferences   Learning Barriers Sight    Learning Preferences Written Material;Audio;Skilled Demonstration             Education Topics: Hypertension, Hypertension Reduction -Define heart disease and high blood pressure. Discus how high blood pressure affects the body and ways to reduce high blood pressure.   Exercise and Your Heart -Discuss why it is important to exercise, the FITT principles of exercise, normal and abnormal responses to exercise, and how to exercise safely.   Angina -Discuss definition of angina, causes of angina, treatment of angina, and how to decrease risk of having angina. Flowsheet Row CARDIAC REHAB PHASE II EXERCISE from 01/08/2023 in [place] [facility]  Date 11/13/22  Educator [facility]       Cardiac Medications -Review what the following cardiac medications are used for, how they affect the body, and side effects that  may occur when taking the medications.  Medications include Aspirin , Beta blockers, calcium channel blockers, ACE Inhibitors, angiotensin receptor blockers, diuretics, digoxin, and antihyperlipidemics. Flowsheet Row CARDIAC REHAB PHASE II EXERCISE from 01/08/2023 in [place] [facility]  Date 01/01/23  Educator hb  Instruction Review Code 1- Verbalizes Understanding       Congestive Heart Failure -Discuss the definition of CHF, how to live with CHF, the signs and symptoms of CHF, and how keep track of weight and sodium intake. Flowsheet Row CARDIAC REHAB PHASE II EXERCISE from 01/08/2023 in [place] [facility]  Date 12/25/22  Educator jh  Instruction Review Code 1- Verbalizes Understanding       Heart Disease and Intimacy -Discus the effect sexual activity has on the heart, how changes occur during intimacy as we age, and safety during sexual activity.   Smoking Cessation / COPD -Discuss different methods to quit smoking, the health benefits of quitting smoking, and the definition of COPD.   Nutrition I: Fats -Discuss the types of cholesterol, what cholesterol does to the heart, and how cholesterol levels can be controlled. Flowsheet Row CARDIAC REHAB PHASE II EXERCISE from 01/08/2023 in  [place] [facility]  Date 12/11/22  Educator HB  Instruction Review Code 1- Verbalizes Understanding       Nutrition II: Labels -Discuss the different components of food labels and how to read food label   Heart Parts/Heart Disease and PAD -Discuss the anatomy of the heart, the pathway of blood circulation through the heart, and these are affected by heart disease.   Stress I: Signs and Symptoms -Discuss the causes of stress, how stress may lead to anxiety and depression, and ways to limit stress.   Stress II: Relaxation -Discuss different types of relaxation techniques to limit stress.   Warning Signs of Stroke / TIA -Discuss definition  of a stroke, what the signs and symptoms are of a stroke, and how to identify when someone is having stroke.   Knowledge Questionnaire Score:  Knowledge Questionnaire Score - 11/10/22 1510       Knowledge Questionnaire Score   Pre Score 18/24             Core Components/Risk Factors/Patient Goals at Admission:  Personal Goals and Risk Factors at Admission - 11/10/22 1517       Core Components/Risk Factors/Patient Goals on Admission    Weight Management Weight Maintenance    Hypertension Yes    Intervention Provide education on lifestyle modifcations including regular physical activity/exercise, weight management, moderate sodium restriction and increased consumption of fresh fruit, vegetables, and low fat dairy, alcohol moderation, and smoking cessation.;Monitor prescription use compliance.    Expected Outcomes Short Term: Continued assessment and intervention until BP is < 140/43mm HG in hypertensive participants. < 130/16mm HG in hypertensive participants with diabetes, heart failure or chronic kidney disease.;Long Term: Maintenance of blood pressure at goal levels.    Lipids Yes    Intervention Provide education and support for participant on nutrition & aerobic/resistive exercise along with prescribed medications to achieve LDL 70mg , HDL >40mg .    Expected Outcomes Short Term: Participant states understanding of desired cholesterol values and is compliant with medications prescribed. Participant is following exercise prescription and nutrition guidelines.;Long Term: Cholesterol controlled with medications as prescribed, with individualized exercise RX and with personalized nutrition plan. Value goals: LDL < 70mg , HDL > 40 mg.    Stress Yes    Intervention Offer individual and/or small group education and counseling on adjustment to heart disease, stress management and health-related lifestyle change. Teach and support self-help strategies.;Refer participants experiencing significant  psychosocial distress to appropriate mental health specialists for further evaluation and treatment. When possible, include family members and significant others in education/counseling sessions.    Expected Outcomes Short Term: Participant demonstrates changes in health-related behavior, relaxation and other stress management skills, ability to obtain effective social support, and compliance with psychotropic medications if prescribed.;Long Term: Emotional wellbeing is indicated by absence of clinically significant psychosocial distress or social isolation.             Core Components/Risk Factors/Patient Goals Review:   Goals and Risk Factor Review     Row Name 12/02/22 1513 12/25/22 1530 01/27/23 1540         Core Components/Risk Factors/Patient Goals Review   Personal Goals Review Weight Management/Obesity;Stress Weight Management/Obesity;Stress Weight Management/Obesity;Stress     Review Catrena does not weigh everyday at home and stated that she has gained some weight. She is trying to cook healthy options and does eat smaller portions for meals. She does attend church at [facility] and loves her small group classes. Going to her small group helps her talk  to friends and helps with her stress in her life. Barbara Lucero does not weight at home abd stated that she has gained some weight. She is trying to pick healthy options to help with weight but eats larger portions. She loves going to her church and being with her small group at [facility]. She stated that being with her small groups help her talk to friends and stress in her life. Barbara Lucero does not weight at home but has not gained weight. She is trying to pick healthy options to help with weight but eats larger portions. She loves going to her church and being with her small group at [facility] She stated that being with her small groups help her talk to friends and stress in her life.     Expected Outcomes Short term: dodaily weight checks and cut back on ice cream    long term: continue to attend small groups to find joy Short term: try to do daily weight checks    long termL continue to attend small group for joy and to help with stress Short term: try to do daily weight checks    long termL continue to attend small group for joy and to help with stress              Core Components/Risk Factors/Patient Goals at Discharge (Final Review):   Goals and Risk Factor Review - 01/27/23 1540       Core Components/Risk Factors/Patient Goals Review   Personal Goals Review Weight Management/Obesity;Stress    Review Barbara Lucero does not weight at home but has not gained weight. She is trying to pick healthy options to help with weight but eats larger portions. She loves going to her church and being with her small group at [facility]. She stated that being with her small groups help her talk to friends and stress in her life.    Expected Outcomes Short term: try to do daily weight checks    long termL continue to attend small group for joy and to help with stress             ITP Comments:  ITP Comments     Row Name 11/10/22 1558 11/13/22 1522 12/03/22 1643 12/31/22 0831 01/28/23 1548   ITP Comments Patient arrived for 1st visit/orientation/education at 1400. 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 During orientation advised patient on arrival and appointment times what to wear, what to do before, during and after exercise. Reviewed attendance and class policy.  Pt is scheduled to return Cardiac Rehab on 11/13/22 at 1500. Pt was advised to come to class 15 minutes before class starts.  Discussed RPE/Dpysnea scales. Patient participated in warm up stretches. Patient was able to complete 6 minute walk test.  Telemetry:NSR. Patient was measured for the equipment. Discussed equipment safety with patient. Took patient pre-anthropometric measurements. Patient finished visit at 1515. .First full day of exercise!  Patient was oriented to gym and equipment including  functions, settings, policies, and procedures.  Patient's individual exercise prescription and treatment plan were reviewed.  All starting workloads were established based on the results of the 6 minute walk test done at initial orientation visit.  The plan for exercise progression was also introduced and progression will be customized based on patient's performance and goals. 30 day review completed. ITP sent to Dr. Dorn Ross, Medical Director of Cardiac Rehab. Continue with ITP unless changes are made by physician. 30 day review completed. ITP sent to Dr. Dorn  Branch, Wellsite Geologist of Cardiac Rehab. Continue with ITP unless changes are made by physician. 30 day review completed. ITP sent to Dr. Dorn Ross, Medical Director of Cardiac Rehab. Continue with ITP unless changes are made by physician.    Row Name 02/24/23 1445           ITP Comments 30 day review completed. ITP sent to Dr. Dorn Ross, Medical Director of Cardiac Rehab. Continue with ITP unless changes are made by physician. Pt has not attended since 01/27/23.  Unable to assess goals this round.                Comments: 30 day review

## 2023-02-26 ENCOUNTER — Encounter (HOSPITAL_COMMUNITY)
Admission: RE | Admit: 2023-02-26 | Discharge: 2023-02-26 | Disposition: A | Discharge status: Home / Self Care | Payer: Medicare Other | Source: Ambulatory Visit | Attending: Cardiovascular Disease | Admitting: Cardiovascular Disease

## 2023-02-26 DIAGNOSIS — Z952 Presence of prosthetic heart valve: Secondary | ICD-10-CM | POA: Insufficient documentation

## 2023-02-26 NOTE — Progress Notes (Signed)
 Daily Session Note  Patient Details  Name: Barbara Lucero MRN: [id number] Date of Birth: [birth year] Referring Provider:   Flowsheet Row CARDIAC REHAB PHASE II ORIENTATION from 11/10/2022 in [facility]  Referring Provider Dr. Verlin       Encounter Date: 02/26/2023  Check In:  Session Check In - 02/26/23 1503       Check-In   Supervising physician immediately available to respond to emergencies See telemetry face sheet for immediately available MD    Location [facility]    Staff Present Harlene Gelineau, MA, RCEP, CCRP, CCET;Other   Vernell Fitting, RN   Virtual Visit No    Medication changes reported     No    Fall or balance concerns reported    No    Warm-up and Cool-down Performed on first and last piece of equipment    Resistance Training Performed Yes    VAD Patient? No    PAD/SET Patient? No      Pain Assessment   Currently in Pain? No/denies             Capillary Blood Glucose: No results found for this or any previous visit (from the past 24 hours).    Social History   Tobacco Use  Smoking Status Former   Current packs/day: 0.50   Average packs/day: 0.5 packs/day for 20.0 years (10.0 ttl pk-yrs)   Types: Cigarettes  Smokeless Tobacco Never    Goals Met:  Independence with exercise equipment Exercise tolerated well No report of concerns or symptoms today Strength training completed today  Goals Unmet:  Not Applicable  Comments: Pt able to follow exercise prescription today without complaint.  Will continue to monitor for progression.

## 2023-02-27 ENCOUNTER — Ambulatory Visit
Admission: RE | Admit: 2023-02-27 | Discharge: 2023-02-27 | Disposition: A | Discharge status: Home / Self Care | Payer: Medicare Other | Source: Ambulatory Visit | Attending: Radiation Oncology | Admitting: Radiation Oncology

## 2023-02-27 ENCOUNTER — Encounter (HOSPITAL_COMMUNITY): Payer: Self-pay

## 2023-02-27 ENCOUNTER — Ambulatory Visit (HOSPITAL_COMMUNITY): Payer: Medicare Other | Attending: Cardiovascular Disease

## 2023-02-27 DIAGNOSIS — Z952 Presence of prosthetic heart valve: Secondary | ICD-10-CM | POA: Diagnosis not present

## 2023-02-27 DIAGNOSIS — C3411 Malignant neoplasm of upper lobe, right bronchus or lung: Secondary | ICD-10-CM | POA: Insufficient documentation

## 2023-02-27 DIAGNOSIS — Z510 Encounter for antineoplastic radiation therapy: Secondary | ICD-10-CM | POA: Diagnosis not present

## 2023-02-27 DIAGNOSIS — Z87891 Personal history of nicotine dependence: Secondary | ICD-10-CM | POA: Diagnosis not present

## 2023-02-27 LAB — ECHOCARDIOGRAM COMPLETE
AR max vel: 1.48 cm2
AV Area VTI: 2.09 cm2
AV Area mean vel: 1.64 cm2
AV Mean grad: 19 mm[Hg]
AV Peak grad: 38.7 mm[Hg]
Ao pk vel: 3.11 m/s
Area-P 1/2: 2.42 cm2
S' Lateral: 2.9 cm

## 2023-03-03 ENCOUNTER — Encounter (HOSPITAL_COMMUNITY): Payer: Medicare Other

## 2023-03-05 ENCOUNTER — Encounter (HOSPITAL_COMMUNITY)
Admission: RE | Admit: 2023-03-05 | Discharge: 2023-03-05 | Disposition: A | Discharge status: Home / Self Care | Payer: Medicare Other | Source: Ambulatory Visit | Attending: Cardiovascular Disease | Admitting: Cardiovascular Disease

## 2023-03-05 DIAGNOSIS — Z952 Presence of prosthetic heart valve: Secondary | ICD-10-CM

## 2023-03-05 NOTE — Progress Notes (Signed)
 Daily Session Note  Patient Details  Name: Barbara Lucero MRN: [id number] Date of Birth: [birth year] Referring Provider:   Flowsheet Row CARDIAC REHAB PHASE II ORIENTATION from 11/10/2022 in [facility]  Referring Provider Dr. Verlin       Encounter Date: 03/05/2023  Check In:  Session Check In - 03/05/23 1500       Check-In   Supervising physician immediately available to respond to emergencies See telemetry face sheet for immediately available MD    Location [facility]    Staff Present Powell Benders, BS, Exercise Physiologist;Jessica Vonzell, MA, RCEP, CCRP, CCET    Virtual Visit No    Medication changes reported     No    Fall or balance concerns reported    No    Tobacco Cessation No Change    Warm-up and Cool-down Performed on first and last piece of equipment    Resistance Training Performed Yes    VAD Patient? No    PAD/SET Patient? No      Pain Assessment   Currently in Pain? No/denies    Pain Score 0-No pain    Multiple Pain Sites No             Capillary Blood Glucose: No results found for this or any previous visit (from the past 24 hours).    Social History   Tobacco Use  Smoking Status Former   Current packs/day: 0.50   Average packs/day: 0.5 packs/day for 20.0 years (10.0 ttl pk-yrs)   Types: Cigarettes  Smokeless Tobacco Never    Goals Met:  Independence with exercise equipment Exercise tolerated well No report of concerns or symptoms today Strength training completed today  Goals Unmet:  Not Applicable  Comments: Pt able to follow exercise prescription today without complaint.  Will continue to monitor for progression.

## 2023-03-06 DIAGNOSIS — C3411 Malignant neoplasm of upper lobe, right bronchus or lung: Secondary | ICD-10-CM | POA: Diagnosis not present

## 2023-03-06 DIAGNOSIS — Z87891 Personal history of nicotine dependence: Secondary | ICD-10-CM | POA: Diagnosis not present

## 2023-03-06 DIAGNOSIS — Z510 Encounter for antineoplastic radiation therapy: Secondary | ICD-10-CM | POA: Diagnosis not present

## 2023-03-09 ENCOUNTER — Encounter: Payer: Self-pay | Admitting: Internal Medicine

## 2023-03-09 NOTE — Progress Notes (Signed)
 Called pt to introduce myself as her Dance Movement Psychotherapist and to discuss the [employer]. Pt would like to apply so I will email her an expense sheet and she will provide proof of income and proof of SNAP benefits on 03/10/23.  Once received I will approve her for the grant and give her my card for any questions or concerns she may have in the future.

## 2023-03-10 ENCOUNTER — Other Ambulatory Visit: Payer: Self-pay

## 2023-03-10 ENCOUNTER — Ambulatory Visit: Payer: Medicare Other | Admitting: Radiation Oncology

## 2023-03-10 ENCOUNTER — Ambulatory Visit
Admission: RE | Admit: 2023-03-10 | Discharge: 2023-03-10 | Disposition: A | Discharge status: Home / Self Care | Payer: Medicare Other | Source: Ambulatory Visit | Attending: Radiation Oncology | Admitting: Radiation Oncology

## 2023-03-10 ENCOUNTER — Encounter (HOSPITAL_COMMUNITY): Payer: Medicare Other

## 2023-03-10 DIAGNOSIS — Z510 Encounter for antineoplastic radiation therapy: Secondary | ICD-10-CM | POA: Diagnosis not present

## 2023-03-10 DIAGNOSIS — C3411 Malignant neoplasm of upper lobe, right bronchus or lung: Secondary | ICD-10-CM | POA: Diagnosis not present

## 2023-03-10 LAB — RAD ONC ARIA SESSION SUMMARY
Course Elapsed Days: 0
Plan Fractions Treated to Date: 1
Plan Prescribed Dose Per Fraction: 18 Gy
Plan Total Fractions Prescribed: 3
Plan Total Prescribed Dose: 54 Gy
Reference Point Dosage Given to Date: 18 Gy
Reference Point Session Dosage Given: 18 Gy
Session Number: 1

## 2023-03-11 ENCOUNTER — Encounter: Payer: Self-pay | Admitting: Internal Medicine

## 2023-03-11 NOTE — Progress Notes (Signed)
 Called pt and left a msg informing her that she didn't bring all the required docs needed to apply for the [employer].  Requested she bring in her proof of income so that she can utilize her grant.

## 2023-03-12 ENCOUNTER — Other Ambulatory Visit: Payer: Self-pay

## 2023-03-12 ENCOUNTER — Ambulatory Visit
Admission: RE | Admit: 2023-03-12 | Discharge: 2023-03-12 | Disposition: A | Discharge status: Home / Self Care | Payer: Medicare Other | Source: Ambulatory Visit | Attending: Radiation Oncology | Admitting: Radiation Oncology

## 2023-03-12 ENCOUNTER — Encounter (HOSPITAL_COMMUNITY): Payer: Medicare Other

## 2023-03-12 DIAGNOSIS — Z510 Encounter for antineoplastic radiation therapy: Secondary | ICD-10-CM | POA: Diagnosis not present

## 2023-03-12 DIAGNOSIS — C3411 Malignant neoplasm of upper lobe, right bronchus or lung: Secondary | ICD-10-CM | POA: Diagnosis not present

## 2023-03-12 LAB — RAD ONC ARIA SESSION SUMMARY
Course Elapsed Days: 2
Plan Fractions Treated to Date: 2
Plan Prescribed Dose Per Fraction: 18 Gy
Plan Total Fractions Prescribed: 3
Plan Total Prescribed Dose: 54 Gy
Reference Point Dosage Given to Date: 36 Gy
Reference Point Session Dosage Given: 18 Gy
Session Number: 2

## 2023-03-13 ENCOUNTER — Encounter: Payer: Self-pay | Admitting: Internal Medicine

## 2023-03-13 NOTE — Progress Notes (Signed)
Pt is approved for the $1000 [employer] grant.  

## 2023-03-16 ENCOUNTER — Other Ambulatory Visit: Payer: Self-pay

## 2023-03-16 ENCOUNTER — Ambulatory Visit: Payer: Medicare Other

## 2023-03-16 ENCOUNTER — Ambulatory Visit: Payer: Medicare Other | Admitting: Radiation Oncology

## 2023-03-16 ENCOUNTER — Ambulatory Visit
Admission: RE | Admit: 2023-03-16 | Discharge: 2023-03-16 | Disposition: A | Discharge status: Home / Self Care | Payer: Medicare Other | Source: Ambulatory Visit | Attending: Radiation Oncology

## 2023-03-16 ENCOUNTER — Ambulatory Visit
Admission: RE | Admit: 2023-03-16 | Discharge: 2023-03-16 | Disposition: A | Discharge status: Home / Self Care | Payer: Medicare Other | Source: Ambulatory Visit | Attending: Radiation Oncology | Admitting: Radiation Oncology

## 2023-03-16 DIAGNOSIS — C3411 Malignant neoplasm of upper lobe, right bronchus or lung: Secondary | ICD-10-CM | POA: Diagnosis not present

## 2023-03-16 DIAGNOSIS — Z87891 Personal history of nicotine dependence: Secondary | ICD-10-CM | POA: Diagnosis not present

## 2023-03-16 DIAGNOSIS — Z510 Encounter for antineoplastic radiation therapy: Secondary | ICD-10-CM | POA: Diagnosis not present

## 2023-03-16 LAB — RAD ONC ARIA SESSION SUMMARY
Course Elapsed Days: 6
Plan Fractions Treated to Date: 3
Plan Prescribed Dose Per Fraction: 18 Gy
Plan Total Fractions Prescribed: 3
Plan Total Prescribed Dose: 54 Gy
Reference Point Dosage Given to Date: 54 Gy
Reference Point Session Dosage Given: 18 Gy
Session Number: 3

## 2023-03-17 ENCOUNTER — Encounter (HOSPITAL_COMMUNITY): Payer: Medicare Other

## 2023-03-17 ENCOUNTER — Ambulatory Visit: Payer: Medicare Other

## 2023-03-17 ENCOUNTER — Ambulatory Visit: Payer: Medicare Other | Admitting: Radiation Oncology

## 2023-03-17 NOTE — Radiation Completion Notes (Addendum)
  Radiation Oncology         [phone] ________________________________  Name: Barbara Lucero MRN: [id number]  Date of Service: 03/16/2023  DOB: [birth year]  End of Treatment Note    Diagnosis:   Putative Stage IA2, cT1bN0M0, NSCLC of the RUL   Intent: Curative     ==========DELIVERED PLANS==========  First Treatment Date: 2023-03-10 Last Treatment Date: 2023-03-16   Plan Name: Lung_R_SBRT Site: Lung, Right Technique: SBRT/SRT-IMRT Mode: Photon Dose Per Fraction: 18 Gy Prescribed Dose (Delivered / Prescribed): 54 Gy / 54 Gy Prescribed Fxs (Delivered / Prescribed): 3 / 3     ==========ON TREATMENT VISIT DATES========== 2023-03-10, 2023-03-12, 2023-03-16, 2023-03-16   See weekly On Treatment Notes in Epic for details in the Media tab (listed as Progress notes on the On Treatment Visit Dates listed above). The patient tolerated radiation.    The patient will receive a call in about one month from the radiation oncology department. She will continue follow up with Dr. Arbutus Ped as well.      Osker Mason, PAC

## 2023-03-19 ENCOUNTER — Ambulatory Visit: Payer: Medicare Other | Admitting: Obstetrics & Gynecology

## 2023-03-19 ENCOUNTER — Encounter: Payer: Self-pay | Admitting: Obstetrics & Gynecology

## 2023-03-19 VITALS — BP 156/79 | HR 74 | Ht 62.0 in | Wt 164.0 lb

## 2023-03-19 DIAGNOSIS — N813 Complete uterovaginal prolapse: Secondary | ICD-10-CM

## 2023-03-19 DIAGNOSIS — N3281 Overactive bladder: Secondary | ICD-10-CM | POA: Diagnosis not present

## 2023-03-19 DIAGNOSIS — Z4689 Encounter for fitting and adjustment of other specified devices: Secondary | ICD-10-CM

## 2023-03-19 NOTE — Progress Notes (Signed)
Chief Complaint  Patient presents with   Pessary Check    Blood pressure (!) 156/79, pulse 74, height 5\' 2"  (1.575 m), weight 164 lb (74.4 kg).  Barbara Lucero presents today for routine follow up related to her pessary.   She uses a Milex ring with support #5 She reports no vaginal discharge and no vaginal bleeding   Likert scale(1 not bothersome -5 very bothersome)  :  1  Exam reveals no undue vaginal mucosal pressure of breakdown, no discharge and no vaginal bleeding.  Vaginal Epithelial Abnormality Classification System:   0 0    No abnormalities 1    Epithelial erythema 2    Granulation tissue 3    Epithelial break or erosion, 1 cm or less 4    Epithelial break or erosion, 1 cm or greater  The pessary is removed, cleaned and replaced without difficulty.      ICD-10-CM   1. 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 OAB (overactive bladder): chronic + suboptimally managed  N32.81        Barbara Lucero will be sen back in 4 months for continued follow up.  Lazaro Arms, MD  03/19/2023 4:29 PM

## 2023-03-24 ENCOUNTER — Encounter (HOSPITAL_COMMUNITY)
Admission: RE | Admit: 2023-03-24 | Discharge: 2023-03-24 | Disposition: A | Discharge status: Home / Self Care | Payer: Medicare Other | Source: Ambulatory Visit | Attending: Cardiovascular Disease

## 2023-03-24 DIAGNOSIS — Z952 Presence of prosthetic heart valve: Secondary | ICD-10-CM

## 2023-03-24 NOTE — Progress Notes (Signed)
Daily Session Note  Patient Details  Name: KASLYN RICHBURG MRN: [id number] Date of Birth: December 20, 1940 Referring Provider:   Flowsheet Row CARDIAC REHAB PHASE II ORIENTATION from 11/10/2022 in [facility] CARDIAC REHABILITATION  Referring Provider Dr. Clifton James       Encounter Date: 03/24/2023  Check In:  Session Check In - 03/24/23 1449       Check-In   Supervising physician immediately available to respond to emergencies See telemetry face sheet for immediately available MD    Location [facility]    Staff Present Terrance Mass, RN;Jessica Juanetta Gosling, MA, RCEP, CCRP, CCET;Katia Hannen, RN;Heather [place], [place], Exercise Physiologist    Virtual Visit No    Medication changes reported     No    Fall or balance concerns reported    No    Warm-up and Cool-down Performed on first and last piece of equipment    Resistance Training Performed Yes    VAD Patient? No    PAD/SET Patient? No      Pain Assessment   Currently in Pain? No/denies    Pain Score 0-No pain    Multiple Pain Sites No             Capillary Blood Glucose: No results found for this or any previous visit (from the past 24 hours).    Social History   Tobacco Use  Smoking Status Former   Current packs/day: 0.50   Average packs/day: 0.5 packs/day for 20.0 years (10.0 ttl pk-yrs)   Types: Cigarettes  Smokeless Tobacco Never    Goals Met:  Independence with exercise equipment Exercise tolerated well No report of concerns or symptoms today Strength training completed today  Goals Unmet:  Not Applicable  Comments: Pt able to follow exercise prescription today without complaint.  Will continue to monitor for progression.

## 2023-03-25 ENCOUNTER — Encounter (HOSPITAL_COMMUNITY): Payer: Self-pay | Admitting: *Deleted

## 2023-03-25 DIAGNOSIS — Z952 Presence of prosthetic heart valve: Secondary | ICD-10-CM

## 2023-03-25 NOTE — Progress Notes (Signed)
Cardiac Individual Treatment Plan  Patient Details  Name: Barbara Lucero MRN: [id number] Date of Birth: Mar 20, 1940 Referring Provider:   Flowsheet Row CARDIAC REHAB PHASE II ORIENTATION from 11/10/2022 in [facility] CARDIAC REHABILITATION  Referring Provider Dr. Clifton James       Initial Encounter Date:  Flowsheet Row CARDIAC REHAB PHASE II ORIENTATION from 11/10/2022 in [place] CARDIAC REHABILITATION  Date 11/10/22       Visit Diagnosis: S/P TAVR (transcatheter aortic valve replacement)  Patient's Home Medications on Admission:  Current Outpatient Medications:    acetaminophen (TYLENOL) 500 MG tablet, Take 1,000 mg by mouth 2 (two) times daily., Disp: , Rfl:    albuterol (VENTOLIN HFA) 108 (90 Base) MCG/ACT inhaler, Inhale 2 puffs into the lungs every 4 (four) hours as needed for shortness of breath or wheezing., Disp: , Rfl:    ALPRAZolam (XANAX) 0.25 MG tablet, Take 0.25 mg by mouth at bedtime., Disp: , Rfl:    Ascorbic Acid (VITAMIN C) 1000 MG tablet, Take 1,000 mg by mouth daily., Disp: , Rfl:    aspirin EC 81 MG tablet, Take 81 mg by mouth at bedtime. Swallow whole., Disp: , Rfl:    b complex vitamins tablet, Take 1 tablet by mouth daily., Disp: , Rfl:    Calcium Carb-Cholecalciferol (CALCIUM 600+D) 600-20 MG-MCG TABS, Take 1 tablet by mouth daily., Disp: , Rfl:    Cholecalciferol (VITAMIN D3) 50 MCG (2000 UT) capsule, Take 2,000 Units by mouth daily., Disp: , Rfl:    cyanocobalamin (VITAMIN B12) 1000 MCG tablet, Take 1,000 mcg by mouth daily as needed (energy)., Disp: , Rfl:    furosemide (LASIX) 20 MG tablet, Take 20 mg by mouth every Wednesday., Disp: , Rfl:    gabapentin (NEURONTIN) 100 MG capsule, Take 1 capsule (100 mg total) by mouth 2 (two) times daily., Disp: , Rfl:    hydrocortisone cream 1 %, Apply 1 Application topically 2 (two) times daily as needed (rash)., Disp: , Rfl:    Multiple Vitamins-Minerals (HAIR SKIN & NAILS) TABS, Take 3 tablets by mouth daily., Disp: ,  Rfl:    Multiple Vitamins-Minerals (MULTIVITAMIN WITH MINERALS) tablet, Take 1 tablet by mouth daily., Disp: , Rfl:    MYRBETRIQ 50 MG TB24 tablet, Take 50 mg by mouth daily., Disp: , Rfl:    olmesartan (BENICAR) 40 MG tablet, Take 40 mg by mouth daily., Disp: , Rfl:    pantoprazole (PROTONIX) 40 MG tablet, Take 40 mg by mouth daily., Disp: , Rfl:    Psyllium (METAMUCIL PO), Take 1 Dose by mouth daily as needed (constipation). 1 dose = 1 teaspoonful, Disp: , Rfl:    sertraline (ZOLOFT) 100 MG tablet, Take 100 mg by mouth at bedtime., Disp: , Rfl:    simvastatin (ZOCOR) 10 MG tablet, Take 10 mg by mouth at bedtime., Disp: , Rfl:    solifenacin (VESICARE) 10 MG tablet, TAKE (1) TABLET BY MOUTH AT BEDTIME., Disp: 30 tablet, Rfl: 11   sulfaSALAzine (AZULFIDINE) 500 MG tablet, Take 1 tablet (500 mg total) by mouth 2 (two) times daily., Disp: 60 tablet, Rfl: 2   vitamin E 400 UNIT capsule, Take 400 Units by mouth daily., Disp: , Rfl:   Past Medical History: Past Medical History:  Diagnosis Date   Anxiety    Arthritis    Cancer (HCC)    face- removed in office   Crohn disease (HCC)    Depression    DVT (deep venous thrombosis) (HCC) 07/27/2017   LLE  GERD (gastroesophageal reflux disease)    HTN (hypertension)    Hyperlipidemia    Neuropathy    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 Clifton James & Dr. Leafy Ro   Severe aortic stenosis    Varicose veins of bilateral lower extremities with other complications     Tobacco Use: Social History   Tobacco Use  Smoking Status Former   Current packs/day: 0.50   Average packs/day: 0.5 packs/day for 20.0 years (10.0 ttl pk-yrs)   Types: Cigarettes  Smokeless Tobacco Never    Labs: Review Flowsheet       Latest Ref Rng & Units 11/22/2021 07/07/2022 10/07/2022  Labs for ITP Cardiac and Pulmonary Rehab  PH, Arterial 7.35 - 7.45 - 7.300  -  PCO2 arterial 32 - 48  mmHg - 54.1  -  Bicarbonate 20.0 - 28.0 mmol/L - 26.6  29.0  -  TCO2 22 - 32 mmol/L 30  28  31  26    O2 Saturation % - 94  74  -    Details       Multiple values from one day are sorted in reverse-chronological order         Capillary Blood Glucose: Lab Results  Component Value Date   GLUCAP 87 09/26/2022   GLUCAP 112 (H) 11/22/2021   GLUCAP 124 (H) 06/06/2016   GLUCAP 125 (H) 06/06/2016     Exercise Target Goals: Exercise Program Goal: Individual exercise prescription set using results from initial 6 min walk test and THRR while considering  patient's activity barriers and safety.   Exercise Prescription Goal: Starting with aerobic activity 30 plus minutes a day, 3 days per week for initial exercise prescription. Provide home exercise prescription and guidelines that participant acknowledges understanding prior to discharge.  Activity Barriers & Risk Stratification:  Activity Barriers & Cardiac Risk Stratification - 11/10/22 1424       Activity Barriers & Cardiac Risk Stratification   Activity Barriers Assistive Device;History of Falls;Balance Concerns;Deconditioning;Left Knee Replacement;Back Problems;Arthritis    Cardiac Risk Stratification Moderate             6 Minute Walk:  6 Minute Walk     Row Name 11/10/22 1539         6 Minute Walk   Phase Initial     Distance 570 feet     Walk Time 6 minutes     # of Rest Breaks 1     MPH 1.07     METS 0.92     RPE 13     VO2 Peak 3.22     Symptoms Yes (comment)     Comments pt used standing walker when doing walk test     Resting HR 62 bpm     Resting BP 110/60     Resting Oxygen Saturation  94 %     Exercise Oxygen Saturation  during 6 min walk 89 %     Max Ex. HR 102 bpm     Max Ex. BP 160/68     2 Minute Post BP 120/60              Oxygen Initial Assessment:   Oxygen Re-Evaluation:   Oxygen Discharge (Final Oxygen Re-Evaluation):   Initial Exercise Prescription:  Initial Exercise  Prescription - 11/10/22 1500       Date of Initial Exercise RX and Referring Provider   Date 11/10/22  Referring Provider Dr. Clifton James      Oxygen   Maintain Oxygen Saturation 88% or higher      NuStep   Level 1    SPM 80    Minutes 15      Arm Ergometer   Level 1    RPM 30    Minutes 15      Prescription Details   Frequency (times per week) 3    Duration Progress to 30 minutes of continuous aerobic without signs/symptoms of physical distress      Intensity   THRR 40-80% of Max Heartrate 93-124    Ratings of Perceived Exertion 11-13    Perceived Dyspnea 0-4      Resistance Training   Training Prescription Yes    Weight 2    Reps 10-15             Perform Capillary Blood Glucose checks as needed.  Exercise Prescription Changes:   Exercise Prescription Changes     Row Name 12/11/22 1500 12/25/22 1500 02/26/23 1500         Response to Exercise   Blood Pressure (Admit) 118/70 122/60 145/55     Blood Pressure (Exercise) 110/70 126/64 --     Blood Pressure (Exit) 120/64 118/60 114/52     Heart Rate (Admit) 73 bpm 67 bpm 69 bpm     Heart Rate (Exercise) 106 bpm 121 bpm 106 bpm     Heart Rate (Exit) 77 bpm 73 bpm 73 bpm     Rating of Perceived Exertion (Exercise) 13 12 12      Duration Continue with 30 min of aerobic exercise without signs/symptoms of physical distress. Continue with 30 min of aerobic exercise without signs/symptoms of physical distress. Continue with 30 min of aerobic exercise without signs/symptoms of physical distress.     Intensity THRR unchanged THRR unchanged THRR unchanged       Progression   Progression Continue to progress workloads to maintain intensity without signs/symptoms of physical distress. Continue to progress workloads to maintain intensity without signs/symptoms of physical distress. Continue to progress workloads to maintain intensity without signs/symptoms of physical distress.       Resistance Training   Training  Prescription Yes Yes Yes     Weight 2 2 lbs / orange band 2 lbs     Reps 10-15 10-15 10-15       NuStep   Level 2 2 2      SPM 77 85 63     Minutes 15 15 15      METs 2 2.4 1.9       Arm Ergometer   Level 1.5 1 2      RPM 91 75 54     Minutes 15 15 15      METs 2 2.1 1.9       Oxygen   Maintain Oxygen Saturation 88% or higher 88% or higher 88% or higher              Exercise Comments:   Exercise Goals and Review:   Exercise Goals     Row Name 11/10/22 1542             Exercise Goals   Increase Physical Activity Yes       Intervention Provide advice, education, support and counseling about physical activity/exercise needs.;Develop an individualized exercise prescription for aerobic and resistive training based on initial evaluation findings, risk stratification, comorbidities and participant's personal goals.       Expected Outcomes Short  Term: Attend rehab on a regular basis to increase amount of physical activity.;Long Term: Add in home exercise to make exercise part of routine and to increase amount of physical activity.;Long Term: Exercising regularly at least 3-5 days a week.       Increase Strength and Stamina Yes       Intervention Provide advice, education, support and counseling about physical activity/exercise needs.;Develop an individualized exercise prescription for aerobic and resistive training based on initial evaluation findings, risk stratification, comorbidities and participant's personal goals.       Expected Outcomes Short Term: Increase workloads from initial exercise prescription for resistance, speed, and METs.;Short Term: Perform resistance training exercises routinely during rehab and add in resistance training at home;Long Term: Improve cardiorespiratory fitness, muscular endurance and strength as measured by increased METs and functional capacity ( )       Able to understand and use rate of perceived exertion (RPE) scale Yes       Intervention  Provide education and explanation on how to use RPE scale       Expected Outcomes Short Term: Able to use RPE daily in rehab to express subjective intensity level;Long Term:  Able to use RPE to guide intensity level when exercising independently       Knowledge and understanding of Target Heart Rate Range (THRR) Yes       Intervention Provide education and explanation of THRR including how the numbers were predicted and where they are located for reference       Expected Outcomes Short Term: Able to state/look up THRR;Long Term: Able to use THRR to govern intensity when exercising independently;Short Term: Able to use daily as guideline for intensity in rehab       Able to check pulse independently Yes       Intervention Provide education and demonstration on how to check pulse in carotid and radial arteries.;Review the importance of being able to check your own pulse for safety during independent exercise       Expected Outcomes Short Term: Able to explain why pulse checking is important during independent exercise       Understanding of Exercise Prescription Yes       Intervention Provide education, explanation, and written materials on patient's individual exercise prescription       Expected Outcomes Short Term: Able to explain program exercise prescription;Long Term: Able to explain home exercise prescription to exercise independently                Exercise Goals Re-Evaluation :  Exercise Goals Re-Evaluation     Row Name 12/02/22 1448 12/25/22 1521 01/27/23 1520 03/02/23 0821       Exercise Goal Re-Evaluation   Exercise Goals Review Increase Physical Activity;Increase Strength and Stamina;Understanding of Exercise Prescription Increase Physical Activity;Increase Strength and Stamina;Understanding of Exercise Prescription Increase Strength and Stamina;Improve claudication pain tolerance and improve walking ability;Increase Physical Activity Increase Physical Activity;Increase Strength  and Stamina;Understanding of Exercise Prescription    Comments Jerolyn Center is doing well in rehab. She was not here for a week due to her daughter not feeling well and that is her transportation. She still has her normal aches and pains in her knees and back. She stated that after she exercises she feels a little bit more energized. She has a pedel at home where she can do both her legs and also her arms. She does go to [employer] once in a while and does seated exercise. Lousie is doing well when she  comes to rehab. She has transpertation with her daurghter and comes about once a week. She is planning on going to the [employer] with her daughter to exercise as well/, She stated that she does not feel like shes improved much but since she is not coming to rehab regularly that would match up. Lousie is doing well when she comes to rehab. She is dependent on transpertation from her daughter and is unreliable to come twice a week. She is still wanting to go to the [employer] with her daughter. She stated that she has been decorating for Christmas and has been needing to take breaks here and there due to fatigue. Lousie is doing well in rehab. She does not come to class regulary due to traspertation. Will continue to monitor and progress as able,    Expected Outcomes Short term: increase levels on the Nustep   long term: continue to exercise at rehab and home Short: attend rehab regulaly basis long term: [facility]se at [facility] or [employer] Short: attend rehab regulaly basis long term: exericse at home or [employer] come to class the two days a week              Discharge Exercise Prescription (Final Exercise Prescription Changes):  Exercise Prescription Changes - 02/26/23 1500       Response to Exercise   Blood Pressure (Admit) 145/55    Blood Pressure (Exit) 114/52    Heart Rate (Admit) 69 bpm    Heart Rate (Exercise) 106 bpm    Heart Rate (Exit) 73 bpm    Rating of Perceived Exertion (Exercise) 12    Duration Continue with 30 min  of aerobic exercise without signs/symptoms of physical distress.    Intensity THRR unchanged      Progression   Progression Continue to progress workloads to maintain intensity without signs/symptoms of physical distress.      Resistance Training   Training Prescription Yes    Weight 2 lbs    Reps 10-15      NuStep   Level 2    SPM 63    Minutes 15    METs 1.9      Arm Ergometer   Level 2    RPM 54    Minutes 15    METs 1.9      Oxygen   Maintain Oxygen Saturation 88% or higher             Nutrition:  Target Goals: Understanding of nutrition guidelines, daily intake of sodium 1500mg , cholesterol 200mg , calories 30% from fat and 7% or less from saturated fats, daily to have 5 or more servings of fruits and vegetables.  Biometrics:  Pre Biometrics - 11/10/22 1543       Pre Biometrics   Height 5\' 1"  (1.549 m)    Weight 176 lb 9.4 oz (80.1 kg)    Waist Circumference 43 inches    Hip Circumference 45 inches    Waist to Hip Ratio 0.96 %    BMI (Calculated) 33.38    Grip Strength 17.1 kg              Nutrition Therapy Plan and Nutrition Goals:   Nutrition Assessments:  MEDIFICTS Score Key: >=70 Need to make dietary changes  40-70 Heart Healthy Diet <= 40 Therapeutic Level Cholesterol Diet  Flowsheet Row CARDIAC REHAB PHASE II ORIENTATION from 11/10/2022 in [facility] CARDIAC REHABILITATION  Picture Your Plate Total Score on Admission 26      Picture Your Plate  Scores: <40 Unhealthy dietary pattern with much room for improvement. 41-50 Dietary pattern unlikely to meet recommendations for good health and room for improvement. 51-60 More healthful dietary pattern, with some room for improvement.  >60 Healthy dietary pattern, although there may be some specific behaviors that could be improved.    Nutrition Goals Re-Evaluation:  Nutrition Goals Re-Evaluation     Row Name 12/02/22 1505 12/25/22 1527 01/27/23 1537         Goals   Nutrition Goal  Healthy heathy Healthy heathy Healthy heathy     Comment Dorathy stated that seh gained a few pounds and said she love ice cream. She eats a ice cream cone about everyday. She does try to eat mostly veggies and she eats a good amount of chicken. She does et smaller portions for meals and does not eat fried food due it hurting her stomach. She does cook most of her meals. She does like to go out once in a while and does choose healthy options. Dorothye stated that she does eat larger portions and that she loves sweets and ice cream. She is trying to pick healthier options when eating and sticks to chicken, salmon and some ground beef. SHe does like salads. She does not drink soda and only drinks one cup coffee in the mornings. Arlinda still contonues to eat larger portion meals but has been trying to cut back. She loves sweets and ice cream. She continues to try to pick healthier options and likes chicken and salmon and does eat some ground beef each week. She continues to only drink water and one cup of coffee during the day.     Expected Outcome Short term: try to cut back on ice cream throught the weeek    long term: contiue to pick healthy options for meal Short term: try to cut back on sweets and choice smaller portion control   long term: contiue to pick healthy options for meal Short term: try to cut back on sweets and choice smaller portion control   long term: contiue to pick healthy options for meal              Nutrition Goals Discharge (Final Nutrition Goals Re-Evaluation):  Nutrition Goals Re-Evaluation - 01/27/23 1537       Goals   Nutrition Goal Healthy heathy    Comment Lakaisha still contonues to eat larger portion meals but has been trying to cut back. She loves sweets and ice cream. She continues to try to pick healthier options and likes chicken and salmon and does eat some ground beef each week. She continues to only drink water and one cup of coffee during the day.    Expected Outcome  Short term: try to cut back on sweets and choice smaller portion control   long term: contiue to pick healthy options for meal             Psychosocial: Target Goals: Acknowledge presence or absence of significant depression and/or stress, maximize coping skills, provide positive support system. Participant is able to verbalize types and ability to use techniques and skills needed for reducing stress and depression.  Initial Review & Psychosocial Screening:  Initial Psych Review & Screening - 11/10/22 1518       Initial Review   Current issues with Current Anxiety/Panic;Current Psychotropic Meds;History of Depression;Current Depression;Current Stress Concerns;Current Sleep Concerns    Source of Stress Concerns Family      Family Dynamics   Good Support System? Yes  Barriers   Psychosocial barriers to participate in program There are no identifiable barriers or psychosocial needs.;The patient should benefit from training in stress management and relaxation.      Screening Interventions   Interventions Encouraged to exercise;To provide support and resources with identified psychosocial needs;Provide feedback about the scores to participant    Expected Outcomes Short Term goal: Utilizing psychosocial counselor, staff and physician to assist with identification of specific Stressors or current issues interfering with healing process. Setting desired goal for each stressor or current issue identified.;Long Term Goal: Stressors or current issues are controlled or eliminated.;Short Term goal: Identification and review with participant of any Quality of Life or Depression concerns found by scoring the questionnaire.;Long Term goal: The participant improves quality of Life and PHQ9 Scores as seen by post scores and/or verbalization of changes             Quality of Life Scores:  Quality of Life - 11/10/22 1551       Quality of Life   Select Quality of Life      Quality of Life  Scores   Health/Function Pre 23.18 %    Socioeconomic Pre 25.43 %    Psych/Spiritual Pre 21.86 %    Family Pre 24 %    GLOBAL Pre 23.48 %            Scores of 19 and below usually indicate a poorer quality of life in these areas.  A difference of  2-3 points is a clinically meaningful difference.  A difference of 2-3 points in the total score of the Quality of Life Index has been associated with significant improvement in overall quality of life, self-image, physical symptoms, and general health in studies assessing change in quality of life.  PHQ-9: Review Flowsheet       02/19/2023 12/25/2022 11/10/2022  Depression screen PHQ 2/9  Decreased Interest 0 2 3  Down, Depressed, Hopeless 1 2 3   PHQ - 2 Score 1 4 6   Altered sleeping - 2 3  Tired, decreased energy - 2 3  Change in appetite - 1 2  Feeling bad or failure about yourself  - 0 0  Trouble concentrating - 0 1  Moving slowly or fidgety/restless - 1 1  Suicidal thoughts - 0 0  PHQ-9 Score - 10 16  Difficult doing work/chores - Somewhat difficult Somewhat difficult   Interpretation of Total Score  Total Score Depression Severity:  1-4 = Minimal depression, 5-9 = Mild depression, 10-14 = Moderate depression, 15-19 = Moderately severe depression, 20-27 = Severe depression   Psychosocial Evaluation and Intervention:  Psychosocial Evaluation - 11/10/22 1523       Psychosocial Evaluation & Interventions   Interventions Stress management education;Relaxation education;Encouraged to exercise with the program and follow exercise prescription    Comments Patient was referred to [facility] with TAVR. Her PHQ-9 score was 16. She is currently being treated for depression and anxiety with Zoloft. 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 She lives with her daughter who is bi-polar. She does support her some providing transportation. Her sister is her main support  person and some friends at her church.  She says she has trouble staying asleep which she takes Alprazolam for which helps. She sleeps in a recliner. She says she slept by her son's bed in a recliner for so many years she just has not slept in  her bed and is more comfortable in the recliner. Her main stressor in her life currently is her 5 year old granddaughter left home 2 years ago and has not contacted anyone in her family. She says she worries about her grandaugther everyday and can not get her off of her mind. She says she saw a counselor at her chruch for a few times after her son died but stopped going. She does not feel like she needs to see a Veterinary surgeon. She feels her depression and anxiety are managed okay with medication. Her main goals for the program are to improve her overall mobility, improve her strength and stamina and get healthier overall. She has no barriers identified to participate in the program.    Expected Outcomes Short Term: attend the program consistently. Long term: meet her personal goals.    Continue Psychosocial Services  Follow up required by staff             Psychosocial Re-Evaluation:  Psychosocial Re-Evaluation     Row Name 12/02/22 1456 12/25/22 1525 01/27/23 1528         Psychosocial Re-Evaluation   Current issues with History of Depression;Current Depression;Current Sleep Concerns;Current Stress Concerns History of Depression;Current Depression;Current Sleep Concerns;Current Stress Concerns History of Depression;Current Depression;Current Sleep Concerns;Current Stress Concerns     Comments Jaileigh is doing well in rehab. She is currently taking Zoloft for depression and anxiety. She lives with her daughter who is bi-polar and she does support her by providing trasportation. She stated that she will sleep half the night and then wake up and can not get back to sleep. She still has stress about her granddaughter levaving home over a year ago and has not  contacted the family. This is her main stressor and she does not have a way to contact her. Lousie is doing okay in rehab when she comes. She is still taking Zoloft fir depression and anixety. She depends on her daughter for transpertation and it does make it hard for her due to being bi-polar and not knowoing how she will feel. She stays worried about her granddaughter when left home over a year ago and they have not contacted the family. This continues to be her main stressor Lousie is okay with rehab when she comes. 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 She is dependent of her daughter for transpertation and it is making it hard for her to come to rehab regulary. She coninues to worry about her granddaughter who left home over a year ago and has not contacted the family.This continues to be her main stressor in life her family. She goes to her [facility] small group to find friends and joy.     Expected Outcomes Short term: continue to pray about granddaughter to help fing relef in stress.   long term: continue to find joy and be positive Short term: continue to pray about granddaughter to help fing relef in stress.   long term: continue to find joy and be positive Short term: continue to pray about granddaughter to help fing relef in stress.   long term: continue to find joy and be positive     Interventions Stress management education;Relaxation education;Encouraged to attend Cardiac Rehabilitation for the exercise -- Stress management education;Relaxation education;Encouraged to attend Cardiac Rehabilitation for the exercise     Continue Psychosocial Services  Follow up required by staff Follow up required by staff Follow up required by staff       Initial  Review   Source of Stress Concerns -- -- Family              Psychosocial Discharge (Final Psychosocial Re-Evaluation):  Psychosocial Re-Evaluation - 01/27/23 1528       Psychosocial Re-Evaluation   Current issues with History of  Depression;Current Depression;Current Sleep Concerns;Current Stress Concerns    Comments Lousie is okay with rehab when she comes. 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 She is dependent of her daughter for transpertation and it is making it hard for her to come to rehab regulary. She coninues to worry about her granddaughter who left home over a year ago and has not contacted the family.This continues to be her main stressor in life her family. She goes to her [facility] small group to find friends and joy.    Expected Outcomes Short term: continue to pray about granddaughter to help fing relef in stress.   long term: continue to find joy and be positive    Interventions Stress management education;Relaxation education;Encouraged to attend Cardiac Rehabilitation for the exercise    Continue Psychosocial Services  Follow up required by staff      Initial Review   Source of Stress Concerns Family             Vocational Rehabilitation: Provide vocational rehab assistance to qualifying candidates.   Vocational Rehab Evaluation & Intervention:  Vocational Rehab - 11/10/22 1433       Initial Vocational Rehab Evaluation & Intervention   Assessment shows need for Vocational Rehabilitation No      Vocational Rehab Re-Evaulation   Comments Patient is retired.             Education: Education Goals: Education classes will be provided on a weekly basis, covering required topics. Participant will state understanding/return demonstration of topics presented.  Learning Barriers/Preferences:  Learning Barriers/Preferences - 11/10/22 1433       Learning Barriers/Preferences   Learning Barriers Sight    Learning Preferences Written Material;Audio;Skilled Demonstration             Education Topics: Hypertension, Hypertension Reduction -Define heart disease and high blood pressure. Discus how high blood pressure affects the body and ways to reduce high blood  pressure.   Exercise and Your Heart -Discuss why it is important to exercise, the FITT principles of exercise, normal and abnormal responses to exercise, and how to exercise safely.   Angina -Discuss definition of angina, causes of angina, treatment of angina, and how to decrease risk of having angina. Flowsheet Row CARDIAC REHAB PHASE II EXERCISE from 03/05/2023 in  [place] CARDIAC REHABILITATION  Date 11/13/22  Educator [facility]       Cardiac Medications -Review what the following cardiac medications are used for, how they affect the body, and side effects that may occur when taking the medications.  Medications include Aspirin, Beta blockers, calcium channel blockers, ACE Inhibitors, angiotensin receptor blockers, diuretics, digoxin, and antihyperlipidemics. Flowsheet Row CARDIAC REHAB PHASE II EXERCISE from 03/05/2023 in [place] CARDIAC REHABILITATION  Date 01/01/23  Educator hb  Instruction Review Code 1- Verbalizes Understanding       Congestive Heart Failure -Discuss the definition of CHF, how to live with CHF, the signs and symptoms of CHF, and how keep track of weight and sodium intake. Flowsheet Row CARDIAC REHAB PHASE II EXERCISE from 03/05/2023 in [place] CARDIAC REHABILITATION  Date 12/25/22  Educator jh  Instruction Review Code 1- Verbalizes Understanding       Heart Disease  and Intimacy -Discus the effect sexual activity has on the heart, how changes occur during intimacy as we age, and safety during sexual activity.   Smoking Cessation / COPD -Discuss different methods to quit smoking, the health benefits of quitting smoking, and the definition of COPD.   Nutrition I: Fats -Discuss the types of cholesterol, what cholesterol does to the heart, and how cholesterol levels can be controlled. Flowsheet Row CARDIAC REHAB PHASE II EXERCISE from 03/05/2023 in [place] CARDIAC REHABILITATION  Date 12/11/22  Educator HB  Instruction Review Code 1- Verbalizes  Understanding       Nutrition II: Labels -Discuss the different components of food labels and how to read food label   Heart Parts/Heart Disease and PAD -Discuss the anatomy of the heart, the pathway of blood circulation through the heart, and these are affected by heart disease.   Stress I: Signs and Symptoms -Discuss the causes of stress, how stress may lead to anxiety and depression, and ways to limit stress. Flowsheet Row CARDIAC REHAB PHASE II EXERCISE from 03/05/2023 in [place] CARDIAC REHABILITATION  Date 03/05/23  Educator hb  Instruction Review Code 1- Verbalizes Understanding       Stress II: Relaxation -Discuss different types of relaxation techniques to limit stress.   Warning Signs of Stroke / TIA -Discuss definition of a stroke, what the signs and symptoms are of a stroke, and how to identify when someone is having stroke.   Knowledge Questionnaire Score:  Knowledge Questionnaire Score - 11/10/22 1510       Knowledge Questionnaire Score   Pre Score 18/24             Core Components/Risk Factors/Patient Goals at Admission:  Personal Goals and Risk Factors at Admission - 11/10/22 1517       Core Components/Risk Factors/Patient Goals on Admission    Weight Management Weight Maintenance    Hypertension Yes    Intervention Provide education on lifestyle modifcations including regular physical activity/exercise, weight management, moderate sodium restriction and increased consumption of fresh fruit, vegetables, and low fat dairy, alcohol moderation, and smoking cessation.;Monitor prescription use compliance.    Expected Outcomes Short Term: Continued assessment and intervention until BP is < 140/62mm HG in hypertensive participants. < 130/70mm HG in hypertensive participants with diabetes, heart failure or chronic kidney disease.;Long Term: Maintenance of blood pressure at goal levels.    Lipids Yes    Intervention Provide education and support for  participant on nutrition & aerobic/resistive exercise along with prescribed medications to achieve LDL 70mg , HDL >40mg .    Expected Outcomes Short Term: Participant states understanding of desired cholesterol values and is compliant with medications prescribed. Participant is following exercise prescription and nutrition guidelines.;Long Term: Cholesterol controlled with medications as prescribed, with individualized exercise RX and with personalized nutrition plan. Value goals: LDL < 70mg , HDL > 40 mg.    Stress Yes    Intervention Offer individual and/or small group education and counseling on adjustment to heart disease, stress management and health-related lifestyle change. Teach and support self-help strategies.;Refer participants experiencing significant psychosocial distress to appropriate mental health specialists for further evaluation and treatment. When possible, include family members and significant others in education/counseling sessions.    Expected Outcomes Short Term: Participant demonstrates changes in health-related behavior, relaxation and other stress management skills, ability to obtain effective social support, and compliance with psychotropic medications if prescribed.;Long Term: Emotional wellbeing is indicated by absence of clinically significant psychosocial distress or social isolation.  Core Components/Risk Factors/Patient Goals Review:   Goals and Risk Factor Review     Row Name 12/02/22 1513 12/25/22 1530 01/27/23 1540         Core Components/Risk Factors/Patient Goals Review   Personal Goals Review Weight Management/Obesity;Stress Weight Management/Obesity;Stress Weight Management/Obesity;Stress     Review Beverlee does not weigh everyday at home and stated that she has gained some weight. She is trying to cook healthy options and does eat smaller portions for meals. She does attend church at [facility] and loves her small group classes. Going to her small  group helps her talk to friends and helps with her stress in her life. Lousie does not weight at home abd stated that she has gained some weight. She is trying to pick healthy options to help with weight but eats larger portions. She loves going to her church and being with her small group at [facility]. She stated that being with her small groups help her talk to friends and stress in her life. Lousie does not weight at home but has not gained weight. She is trying to pick healthy options to help with weight but eats larger portions. She loves going to her church and being with her small group at [facility]. She stated that being with her small groups help her talk to friends and stress in her life.     Expected Outcomes Short term: dodaily weight checks and cut back on ice cream   long term: continue to attend small groups to find joy Short term: try to do daily weight checks    long termL continue to attend small group for joy and to help with stress Short term: try to do daily weight checks    long termL continue to attend small group for joy and to help with stress              Core Components/Risk Factors/Patient Goals at Discharge (Final Review):   Goals and Risk Factor Review - 01/27/23 1540       Core Components/Risk Factors/Patient Goals Review   Personal Goals Review Weight Management/Obesity;Stress    Review Lousie does not weight at home but has not gained weight. She is trying to pick healthy options to help with weight but eats larger portions. She loves going to her church and being with her small group at [facility]. She stated that being with her small groups help her talk to friends and stress in her life.    Expected Outcomes Short term: try to do daily weight checks    long termL continue to attend small group for joy and to help with stress             ITP Comments:  ITP Comments     Row Name 11/10/22 1558 11/13/22 1522 12/03/22 1643 12/31/22 0831 01/28/23 1548   ITP Comments Patient  arrived for 1st visit/orientation/education at 1400. 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 During orientation advised patient on arrival and appointment times what to wear, what to do before, during and after exercise. Reviewed attendance and class policy.  Pt is scheduled to return Cardiac Rehab on 11/13/22 at 1500. Pt was advised to come to class 15 minutes before class starts.  Discussed RPE/Dpysnea scales. Patient participated in warm up stretches. Patient was able to complete 6 minute walk test.  Telemetry:NSR. Patient was measured for the equipment. Discussed equipment safety with patient. Took patient pre-anthropometric measurements. Patient finished visit at 1515. .First  full day of exercise!  Patient was oriented to gym and equipment including functions, settings, policies, and procedures.  Patient's individual exercise prescription and treatment plan were reviewed.  All starting workloads were established based on the results of the 6 minute walk test done at initial orientation visit.  The plan for exercise progression was also introduced and progression will be customized based on patient's performance and goals. 30 day review completed. ITP sent to Dr. Dina Rich, Medical Director of Cardiac Rehab. Continue with ITP unless changes are made by physician. 30 day review completed. ITP sent to Dr. Dina Rich, Medical Director of Cardiac Rehab. Continue with ITP unless changes are made by physician. 30 day review completed. ITP sent to Dr. Dina Rich, Medical Director of Cardiac Rehab. Continue with ITP unless changes are made by physician.    Row Name 02/24/23 1445 03/25/23 1147         ITP Comments 30 day review completed. ITP sent to Dr. Dina Rich, Medical Director of Cardiac Rehab. Continue with ITP unless changes are made by physician. Pt has not attended since 01/27/23.  Unable to assess goals this round. 30 day review completed. ITP sent to Dr. Dina Rich, Medical Director of Cardiac Rehab. Continue with ITP unless changes are made by physician.               Comments: 30 day review

## 2023-03-26 ENCOUNTER — Encounter (HOSPITAL_COMMUNITY)
Admission: RE | Admit: 2023-03-26 | Discharge: 2023-03-26 | Disposition: A | Discharge status: Home / Self Care | Payer: Medicare Other | Source: Ambulatory Visit | Attending: Cardiovascular Disease | Admitting: Cardiovascular Disease

## 2023-03-26 DIAGNOSIS — Z952 Presence of prosthetic heart valve: Secondary | ICD-10-CM

## 2023-03-26 NOTE — Progress Notes (Signed)
Daily Session Note  Patient Details  Name: Barbara Lucero MRN: [id number] Date of Birth: [birth year] Referring Provider:   Flowsheet Row CARDIAC REHAB PHASE II ORIENTATION from 11/10/2022 in [facility] CARDIAC REHAB[facility]  Referring Provider Dr. Clifton James       Encounter Date: 03/26/2023  Check In:  Session Check In - 03/26/23 1429       Check-In   Supervising physician immediately available to respond to emergencies See telemetry face sheet for immediately available ER MD    Location [facility]    Staff Present Rodena Medin, RN, BSN;Jessica Juanetta Gosling, MA, RCEP, CCRP, CCET;Heather Fredric Mare, [place], Exercise Physiologist    Virtual Visit No    Medication changes reported     No    Fall or balance concerns reported    No    Warm-up and Cool-down Performed on first and last piece of equipment    Resistance Training Performed Yes    VAD Patient? No    PAD/SET Patient? No      Pain Assessment   Currently in Pain? No/denies    Pain Score 0-No pain    Multiple Pain Sites No             Capillary Blood Glucose: No results found for this or any previous visit (from the past 24 hours).    Social History   Tobacco Use  Smoking Status Former   Current packs/day: 0.50   Average packs/day: 0.5 packs/day for 20.0 years (10.0 ttl pk-yrs)   Types: Cigarettes  Smokeless Tobacco Never    Goals Met:  Independence with exercise equipment Exercise tolerated well No report of concerns or symptoms today Strength training completed today  Goals Unmet:  Not Applicable  Comments: Pt able to follow exercise prescription today without complaint.  Will continue to monitor for progression.

## 2023-03-31 ENCOUNTER — Encounter (HOSPITAL_COMMUNITY): Payer: Medicare Other

## 2023-03-31 DIAGNOSIS — R109 Unspecified abdominal pain: Secondary | ICD-10-CM | POA: Diagnosis not present

## 2023-03-31 DIAGNOSIS — R197 Diarrhea, unspecified: Secondary | ICD-10-CM | POA: Diagnosis not present

## 2023-03-31 DIAGNOSIS — R7301 Impaired fasting glucose: Secondary | ICD-10-CM | POA: Diagnosis not present

## 2023-03-31 DIAGNOSIS — E559 Vitamin D deficiency, unspecified: Secondary | ICD-10-CM | POA: Diagnosis not present

## 2023-03-31 DIAGNOSIS — E782 Mixed hyperlipidemia: Secondary | ICD-10-CM | POA: Diagnosis not present

## 2023-03-31 DIAGNOSIS — Z87891 Personal history of nicotine dependence: Secondary | ICD-10-CM | POA: Diagnosis not present

## 2023-03-31 DIAGNOSIS — I10 Essential (primary) hypertension: Secondary | ICD-10-CM | POA: Diagnosis not present

## 2023-04-02 ENCOUNTER — Encounter (HOSPITAL_COMMUNITY): Payer: Medicare Other

## 2023-04-07 ENCOUNTER — Encounter (HOSPITAL_COMMUNITY): Payer: Medicare Other

## 2023-04-07 DIAGNOSIS — R911 Solitary pulmonary nodule: Secondary | ICD-10-CM | POA: Diagnosis not present

## 2023-04-07 DIAGNOSIS — E559 Vitamin D deficiency, unspecified: Secondary | ICD-10-CM | POA: Diagnosis not present

## 2023-04-07 DIAGNOSIS — I10 Essential (primary) hypertension: Secondary | ICD-10-CM | POA: Diagnosis not present

## 2023-04-07 DIAGNOSIS — I872 Venous insufficiency (chronic) (peripheral): Secondary | ICD-10-CM | POA: Diagnosis not present

## 2023-04-07 DIAGNOSIS — E785 Hyperlipidemia, unspecified: Secondary | ICD-10-CM | POA: Diagnosis not present

## 2023-04-07 DIAGNOSIS — R7303 Prediabetes: Secondary | ICD-10-CM | POA: Diagnosis not present

## 2023-04-07 DIAGNOSIS — K5080 Crohn's disease of both small and large intestine without complications: Secondary | ICD-10-CM | POA: Diagnosis not present

## 2023-04-07 DIAGNOSIS — M545 Low back pain, unspecified: Secondary | ICD-10-CM | POA: Diagnosis not present

## 2023-04-07 DIAGNOSIS — I700 Atherosclerosis of aorta: Secondary | ICD-10-CM | POA: Diagnosis not present

## 2023-04-07 DIAGNOSIS — G8929 Other chronic pain: Secondary | ICD-10-CM | POA: Diagnosis not present

## 2023-04-07 DIAGNOSIS — I350 Nonrheumatic aortic (valve) stenosis: Secondary | ICD-10-CM | POA: Diagnosis not present

## 2023-04-07 DIAGNOSIS — D649 Anemia, unspecified: Secondary | ICD-10-CM | POA: Diagnosis not present

## 2023-04-09 ENCOUNTER — Encounter (HOSPITAL_COMMUNITY): Payer: Medicare Other

## 2023-04-09 DIAGNOSIS — R197 Diarrhea, unspecified: Secondary | ICD-10-CM | POA: Diagnosis not present

## 2023-04-13 ENCOUNTER — Ambulatory Visit
Admission: RE | Admit: 2023-04-13 | Discharge: 2023-04-13 | Disposition: A | Discharge status: Home / Self Care | Payer: Medicare Other | Source: Ambulatory Visit | Attending: Radiation Oncology | Admitting: Radiation Oncology

## 2023-04-13 NOTE — Progress Notes (Signed)
  Radiation Oncology         [phone] ________________________________  Name: Barbara Lucero MRN: [id number]  Date of Service: 04/13/2023  DOB: 1940/08/25  Post Treatment Telephone Note  Diagnosis:  Putative Stage IA2, [id number], NSCLC of the RUL (as documented in provider EOT note)  The patient was not available for call today. No voicemail option.  The patient has scheduled follow up with her medical oncologist Dr. Arbutus Ped for ongoing care, and was encouraged to call if she develops concerns or questions regarding radiation.    Ruel Favors, LPN

## 2023-04-14 ENCOUNTER — Encounter (HOSPITAL_COMMUNITY): Payer: Medicare Other

## 2023-04-14 ENCOUNTER — Telehealth: Payer: Self-pay | Admitting: Internal Medicine

## 2023-04-14 NOTE — Telephone Encounter (Signed)
[facility] [place] Clinical Research Coordinator note:   This visit for Subject Barbara Lucero with DOB: [birth year] on 04/14/2023. Subject contacted regarding [id number] to inform that Principal Investigator has changed to Dr. Delton Coombes. Unable to leave voicemail. Will try calling again.

## 2023-04-16 ENCOUNTER — Encounter (HOSPITAL_COMMUNITY): Payer: Medicare Other

## 2023-04-21 ENCOUNTER — Encounter (HOSPITAL_COMMUNITY): Payer: Medicare Other

## 2023-04-22 ENCOUNTER — Encounter (HOSPITAL_COMMUNITY): Payer: Self-pay | Admitting: *Deleted

## 2023-04-22 DIAGNOSIS — Z952 Presence of prosthetic heart valve: Secondary | ICD-10-CM

## 2023-04-22 NOTE — Progress Notes (Signed)
 Cardiac Individual Treatment Plan  Patient Details  Name: Barbara Lucero MRN: [id number] Date of Birth: 1940/04/14 Referring Provider:   Flowsheet Row CARDIAC REHAB PHASE II ORIENTATION from 11/10/2022 in [facility] CARDIAC REHABILITATION  Referring Provider Dr. Clifton James       Initial Encounter Date:  Flowsheet Row CARDIAC REHAB PHASE II ORIENTATION from 11/10/2022 in [place] CARDIAC REHABILITATION  Date 11/10/22       Visit Diagnosis: S/P TAVR (transcatheter aortic valve replacement)  Patient's Home Medications on Admission:  Current Outpatient Medications:    acetaminophen (TYLENOL) 500 MG tablet, Take 1,000 mg by mouth 2 (two) times daily., Disp: , Rfl:    albuterol (VENTOLIN HFA) 108 (90 Base) MCG/ACT inhaler, Inhale 2 puffs into the lungs every 4 (four) hours as needed for shortness of breath or wheezing., Disp: , Rfl:    ALPRAZolam (XANAX) 0.25 MG tablet, Take 0.25 mg by mouth at bedtime., Disp: , Rfl:    Ascorbic Acid (VITAMIN C) 1000 MG tablet, Take 1,000 mg by mouth daily., Disp: , Rfl:    aspirin EC 81 MG tablet, Take 81 mg by mouth at bedtime. Swallow whole., Disp: , Rfl:    b complex vitamins tablet, Take 1 tablet by mouth daily., Disp: , Rfl:    Calcium Carb-Cholecalciferol (CALCIUM 600+D) 600-20 MG-MCG TABS, Take 1 tablet by mouth daily., Disp: , Rfl:    Cholecalciferol (VITAMIN D3) 50 MCG (2000 UT) capsule, Take 2,000 Units by mouth daily., Disp: , Rfl:    cyanocobalamin (VITAMIN B12) 1000 MCG tablet, Take 1,000 mcg by mouth daily as needed (energy)., Disp: , Rfl:    furosemide (LASIX) 20 MG tablet, Take 20 mg by mouth every Wednesday., Disp: , Rfl:    gabapentin (NEURONTIN) 100 MG capsule, Take 1 capsule (100 mg total) by mouth 2 (two) times daily., Disp: , Rfl:    hydrocortisone cream 1 %, Apply 1 Application topically 2 (two) times daily as needed (rash)., Disp: , Rfl:    Multiple Vitamins-Minerals (HAIR SKIN & NAILS) TABS, Take 3 tablets by mouth daily., Disp: ,  Rfl:    Multiple Vitamins-Minerals (MULTIVITAMIN WITH MINERALS) tablet, Take 1 tablet by mouth daily., Disp: , Rfl:    MYRBETRIQ 50 MG TB24 tablet, Take 50 mg by mouth daily., Disp: , Rfl:    olmesartan (BENICAR) 40 MG tablet, Take 40 mg by mouth daily., Disp: , Rfl:    pantoprazole (PROTONIX) 40 MG tablet, Take 40 mg by mouth daily., Disp: , Rfl:    Psyllium (METAMUCIL PO), Take 1 Dose by mouth daily as needed (constipation). 1 dose = 1 teaspoonful, Disp: , Rfl:    sertraline (ZOLOFT) 100 MG tablet, Take 100 mg by mouth at bedtime., Disp: , Rfl:    simvastatin (ZOCOR) 10 MG tablet, Take 10 mg by mouth at bedtime., Disp: , Rfl:    solifenacin (VESICARE) 10 MG tablet, TAKE (1) TABLET BY MOUTH AT BEDTIME., Disp: 30 tablet, Rfl: 11   sulfaSALAzine (AZULFIDINE) 500 MG tablet, Take 1 tablet (500 mg total) by mouth 2 (two) times daily., Disp: 60 tablet, Rfl: 2   vitamin E 400 UNIT capsule, Take 400 Units by mouth daily., Disp: , Rfl:   Past Medical History: Past Medical History:  Diagnosis Date   Anxiety    Arthritis    Cancer (HCC)    face- removed in office   Crohn disease (HCC)    Depression    DVT (deep venous thrombosis) (HCC) 07/27/2017   LLE  GERD (gastroesophageal reflux disease)    HTN (hypertension)    Hyperlipidemia    Neuropathy    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 Clifton James & Dr. Leafy Ro   Severe aortic stenosis    Varicose veins of bilateral lower extremities with other complications     Tobacco Use: Social History   Tobacco Use  Smoking Status Former   Current packs/day: 0.50   Average packs/day: 0.5 packs/day for 20.0 years (10.0 ttl pk-yrs)   Types: Cigarettes  Smokeless Tobacco Never    Labs: Review Flowsheet       Latest Ref Rng & Units 11/22/2021 07/07/2022 10/07/2022  Labs for ITP Cardiac and Pulmonary Rehab  PH, Arterial 7.35 - 7.45 - 7.300  -  PCO2 arterial 32 - 48  mmHg - 54.1  -  Bicarbonate 20.0 - 28.0 mmol/L - 26.6  29.0  -  TCO2 22 - 32 mmol/L 30  28  31  26    O2 Saturation % - 94  74  -    Details       Multiple values from one day are sorted in reverse-chronological order         Capillary Blood Glucose: Lab Results  Component Value Date   GLUCAP 87 09/26/2022   GLUCAP 112 (H) 11/22/2021   GLUCAP 124 (H) 06/06/2016   GLUCAP 125 (H) 06/06/2016     Exercise Target Goals: Exercise Program Goal: Individual exercise prescription set using results from initial 6 min walk test and THRR while considering  patient's activity barriers and safety.   Exercise Prescription Goal: Starting with aerobic activity 30 plus minutes a day, 3 days per week for initial exercise prescription. Provide home exercise prescription and guidelines that participant acknowledges understanding prior to discharge.  Activity Barriers & Risk Stratification:  Activity Barriers & Cardiac Risk Stratification - 11/10/22 1424       Activity Barriers & Cardiac Risk Stratification   Activity Barriers Assistive Device;History of Falls;Balance Concerns;Deconditioning;Left Knee Replacement;Back Problems;Arthritis    Cardiac Risk Stratification Moderate             6 Minute Walk:  6 Minute Walk     Row Name 11/10/22 1539         6 Minute Walk   Phase Initial     Distance 570 feet     Walk Time 6 minutes     # of Rest Breaks 1     MPH 1.07     METS 0.92     RPE 13     VO2 Peak 3.22     Symptoms Yes (comment)     Comments pt used standing walker when doing walk test     Resting HR 62 bpm     Resting BP 110/60     Resting Oxygen Saturation  94 %     Exercise Oxygen Saturation  during 6 min walk 89 %     Max Ex. HR 102 bpm     Max Ex. BP 160/68     2 Minute Post BP 120/60              Oxygen Initial Assessment:   Oxygen Re-Evaluation:   Oxygen Discharge (Final Oxygen Re-Evaluation):   Initial Exercise Prescription:  Initial Exercise  Prescription - 11/10/22 1500       Date of Initial Exercise RX and Referring Provider   Date 11/10/22  Referring Provider Dr. Clifton James      Oxygen   Maintain Oxygen Saturation 88% or higher      NuStep   Level 1    SPM 80    Minutes 15      Arm Ergometer   Level 1    RPM 30    Minutes 15      Prescription Details   Frequency (times per week) 3    Duration Progress to 30 minutes of continuous aerobic without signs/symptoms of physical distress      Intensity   THRR 40-80% of Max Heartrate 93-124    Ratings of Perceived Exertion 11-13    Perceived Dyspnea 0-4      Resistance Training   Training Prescription Yes    Weight 2    Reps 10-15             Perform Capillary Blood Glucose checks as needed.  Exercise Prescription Changes:   Exercise Prescription Changes     Row Name 12/11/22 1500 12/25/22 1500 02/26/23 1500         Response to Exercise   Blood Pressure (Admit) 118/70 122/60 145/55     Blood Pressure (Exercise) 110/70 126/64 --     Blood Pressure (Exit) 120/64 118/60 114/52     Heart Rate (Admit) 73 bpm 67 bpm 69 bpm     Heart Rate (Exercise) 106 bpm 121 bpm 106 bpm     Heart Rate (Exit) 77 bpm 73 bpm 73 bpm     Rating of Perceived Exertion (Exercise) 13 12 12      Duration Continue with 30 min of aerobic exercise without signs/symptoms of physical distress. Continue with 30 min of aerobic exercise without signs/symptoms of physical distress. Continue with 30 min of aerobic exercise without signs/symptoms of physical distress.     Intensity THRR unchanged THRR unchanged THRR unchanged       Progression   Progression Continue to progress workloads to maintain intensity without signs/symptoms of physical distress. Continue to progress workloads to maintain intensity without signs/symptoms of physical distress. Continue to progress workloads to maintain intensity without signs/symptoms of physical distress.       Resistance Training   Training  Prescription Yes Yes Yes     Weight 2 2 lbs / orange band 2 lbs     Reps 10-15 10-15 10-15       NuStep   Level 2 2 2      SPM 77 85 63     Minutes 15 15 15      METs 2 2.4 1.9       Arm Ergometer   Level 1.5 1 2      RPM 91 75 54     Minutes 15 15 15      METs 2 2.1 1.9       Oxygen   Maintain Oxygen Saturation 88% or higher 88% or higher 88% or higher              Exercise Comments:   Exercise Goals and Review:   Exercise Goals     Row Name 11/10/22 1542             Exercise Goals   Increase Physical Activity Yes       Intervention Provide advice, education, support and counseling about physical activity/exercise needs.;Develop an individualized exercise prescription for aerobic and resistive training based on initial evaluation findings, risk stratification, comorbidities and participant's personal goals.       Expected Outcomes Short  Term: Attend rehab on a regular basis to increase amount of physical activity.;Long Term: Add in home exercise to make exercise part of routine and to increase amount of physical activity.;Long Term: Exercising regularly at least 3-5 days a week.       Increase Strength and Stamina Yes       Intervention Provide advice, education, support and counseling about physical activity/exercise needs.;Develop an individualized exercise prescription for aerobic and resistive training based on initial evaluation findings, risk stratification, comorbidities and participant's personal goals.       Expected Outcomes Short Term: Increase workloads from initial exercise prescription for resistance, speed, and METs.;Short Term: Perform resistance training exercises routinely during rehab and add in resistance training at home;Long Term: Improve cardiorespiratory fitness, muscular endurance and strength as measured by increased METs and functional capacity ( )       Able to understand and use rate of perceived exertion (RPE) scale Yes       Intervention  Provide education and explanation on how to use RPE scale       Expected Outcomes Short Term: Able to use RPE daily in rehab to express subjective intensity level;Long Term:  Able to use RPE to guide intensity level when exercising independently       Knowledge and understanding of Target Heart Rate Range (THRR) Yes       Intervention Provide education and explanation of THRR including how the numbers were predicted and where they are located for reference       Expected Outcomes Short Term: Able to state/look up THRR;Long Term: Able to use THRR to govern intensity when exercising independently;Short Term: Able to use daily as guideline for intensity in rehab       Able to check pulse independently Yes       Intervention Provide education and demonstration on how to check pulse in carotid and radial arteries.;Review the importance of being able to check your own pulse for safety during independent exercise       Expected Outcomes Short Term: Able to explain why pulse checking is important during independent exercise       Understanding of Exercise Prescription Yes       Intervention Provide education, explanation, and written materials on patient's individual exercise prescription       Expected Outcomes Short Term: Able to explain program exercise prescription;Long Term: Able to explain home exercise prescription to exercise independently                Exercise Goals Re-Evaluation :  Exercise Goals Re-Evaluation     Row Name 12/02/22 1448 12/25/22 1521 01/27/23 1520 03/02/23 0821       Exercise Goal Re-Evaluation   Exercise Goals Review Increase Physical Activity;Increase Strength and Stamina;Understanding of Exercise Prescription Increase Physical Activity;Increase Strength and Stamina;Understanding of Exercise Prescription Increase Strength and Stamina;Improve claudication pain tolerance and improve walking ability;Increase Physical Activity Increase Physical Activity;Increase Strength  and Stamina;Understanding of Exercise Prescription    Comments Jerolyn Center is doing well in rehab. She was not here for a week due to her daughter not feeling well and that is her transportation. She still has her normal aches and pains in her knees and back. She stated that after she exercises she feels a little bit more energized. She has a pedel at home where she can do both her legs and also her arms. She does go to [employer] once in a while and does seated exercise. Lousie is doing well when she  comes to rehab. She has transpertation with her daurghter and comes about once a week. She is planning on going to the [employer] with her daughter to exercise as well/, She stated that she does not feel like shes improved much but since she is not coming to rehab regularly that would match up. Lousie is doing well when she comes to rehab. She is dependent on transpertation from her daughter and is unreliable to come twice a week. She is still wanting to go to the [employer] with her daughter. She stated that she has been decorating for Christmas and has been needing to take breaks here and there due to fatigue. Lousie is doing well in rehab. She does not come to class regulary due to traspertation. Will continue to monitor and progress as able,    Expected Outcomes Short term: increase levels on the Nustep   long term: continue to exercise at rehab and home Short: attend rehab regulaly basis long term: [facility]se at [facility] or [employer] Short: attend rehab regulaly basis long term: exericse at home or [employer] come to class the two days a week              Discharge Exercise Prescription (Final Exercise Prescription Changes):  Exercise Prescription Changes - 02/26/23 1500       Response to Exercise   Blood Pressure (Admit) 145/55    Blood Pressure (Exit) 114/52    Heart Rate (Admit) 69 bpm    Heart Rate (Exercise) 106 bpm    Heart Rate (Exit) 73 bpm    Rating of Perceived Exertion (Exercise) 12    Duration Continue with 30 min  of aerobic exercise without signs/symptoms of physical distress.    Intensity THRR unchanged      Progression   Progression Continue to progress workloads to maintain intensity without signs/symptoms of physical distress.      Resistance Training   Training Prescription Yes    Weight 2 lbs    Reps 10-15      NuStep   Level 2    SPM 63    Minutes 15    METs 1.9      Arm Ergometer   Level 2    RPM 54    Minutes 15    METs 1.9      Oxygen   Maintain Oxygen Saturation 88% or higher             Nutrition:  Target Goals: Understanding of nutrition guidelines, daily intake of sodium 1500mg , cholesterol 200mg , calories 30% from fat and 7% or less from saturated fats, daily to have 5 or more servings of fruits and vegetables.  Biometrics:  Pre Biometrics - 11/10/22 1543       Pre Biometrics   Height 5\' 1"  (1.549 m)    Weight 176 lb 9.4 oz (80.1 kg)    Waist Circumference 43 inches    Hip Circumference 45 inches    Waist to Hip Ratio 0.96 %    BMI (Calculated) 33.38    Grip Strength 17.1 kg              Nutrition Therapy Plan and Nutrition Goals:   Nutrition Assessments:  MEDIFICTS Score Key: >=70 Need to make dietary changes  40-70 Heart Healthy Diet <= 40 Therapeutic Level Cholesterol Diet  Flowsheet Row CARDIAC REHAB PHASE II ORIENTATION from 11/10/2022 in [facility] CARDIAC REHABILITATION  Picture Your Plate Total Score on Admission 26      Picture Your Plate  Scores: <40 Unhealthy dietary pattern with much room for improvement. 41-50 Dietary pattern unlikely to meet recommendations for good health and room for improvement. 51-60 More healthful dietary pattern, with some room for improvement.  >60 Healthy dietary pattern, although there may be some specific behaviors that could be improved.    Nutrition Goals Re-Evaluation:  Nutrition Goals Re-Evaluation     Row Name 12/02/22 1505 12/25/22 1527 01/27/23 1537         Goals   Nutrition Goal  Healthy heathy Healthy heathy Healthy heathy     Comment Mana stated that seh gained a few pounds and said she love ice cream. She eats a ice cream cone about everyday. She does try to eat mostly veggies and she eats a good amount of chicken. She does et smaller portions for meals and does not eat fried food due it hurting her stomach. She does cook most of her meals. She does like to go out once in a while and does choose healthy options. Juno stated that she does eat larger portions and that she loves sweets and ice cream. She is trying to pick healthier options when eating and sticks to chicken, salmon and some ground beef. SHe does like salads. She does not drink soda and only drinks one cup coffee in the mornings. Quincey still contonues to eat larger portion meals but has been trying to cut back. She loves sweets and ice cream. She continues to try to pick healthier options and likes chicken and salmon and does eat some ground beef each week. She continues to only drink water and one cup of coffee during the day.     Expected Outcome Short term: try to cut back on ice cream throught the weeek    long term: contiue to pick healthy options for meal Short term: try to cut back on sweets and choice smaller portion control   long term: contiue to pick healthy options for meal Short term: try to cut back on sweets and choice smaller portion control   long term: contiue to pick healthy options for meal              Nutrition Goals Discharge (Final Nutrition Goals Re-Evaluation):  Nutrition Goals Re-Evaluation - 01/27/23 1537       Goals   Nutrition Goal Healthy heathy    Comment Ovella still contonues to eat larger portion meals but has been trying to cut back. She loves sweets and ice cream. She continues to try to pick healthier options and likes chicken and salmon and does eat some ground beef each week. She continues to only drink water and one cup of coffee during the day.    Expected Outcome  Short term: try to cut back on sweets and choice smaller portion control   long term: contiue to pick healthy options for meal             Psychosocial: Target Goals: Acknowledge presence or absence of significant depression and/or stress, maximize coping skills, provide positive support system. Participant is able to verbalize types and ability to use techniques and skills needed for reducing stress and depression.  Initial Review & Psychosocial Screening:  Initial Psych Review & Screening - 11/10/22 1518       Initial Review   Current issues with Current Anxiety/Panic;Current Psychotropic Meds;History of Depression;Current Depression;Current Stress Concerns;Current Sleep Concerns    Source of Stress Concerns Family      Family Dynamics   Good Support System? Yes  Barriers   Psychosocial barriers to participate in program There are no identifiable barriers or psychosocial needs.;The patient should benefit from training in stress management and relaxation.      Screening Interventions   Interventions Encouraged to exercise;To provide support and resources with identified psychosocial needs;Provide feedback about the scores to participant    Expected Outcomes Short Term goal: Utilizing psychosocial counselor, staff and physician to assist with identification of specific Stressors or current issues interfering with healing process. Setting desired goal for each stressor or current issue identified.;Long Term Goal: Stressors or current issues are controlled or eliminated.;Short Term goal: Identification and review with participant of any Quality of Life or Depression concerns found by scoring the questionnaire.;Long Term goal: The participant improves quality of Life and PHQ9 Scores as seen by post scores and/or verbalization of changes             Quality of Life Scores:  Quality of Life - 11/10/22 1551       Quality of Life   Select Quality of Life      Quality of Life  Scores   Health/Function Pre 23.18 %    Socioeconomic Pre 25.43 %    Psych/Spiritual Pre 21.86 %    Family Pre 24 %    GLOBAL Pre 23.48 %            Scores of 19 and below usually indicate a poorer quality of life in these areas.  A difference of  2-3 points is a clinically meaningful difference.  A difference of 2-3 points in the total score of the Quality of Life Index has been associated with significant improvement in overall quality of life, self-image, physical symptoms, and general health in studies assessing change in quality of life.  PHQ-9: Review Flowsheet       02/19/2023 12/25/2022 11/10/2022  Depression screen PHQ 2/9  Decreased Interest 0 2 3  Down, Depressed, Hopeless 1 2 3   PHQ - 2 Score 1 4 6   Altered sleeping - 2 3  Tired, decreased energy - 2 3  Change in appetite - 1 2  Feeling bad or failure about yourself  - 0 0  Trouble concentrating - 0 1  Moving slowly or fidgety/restless - 1 1  Suicidal thoughts - 0 0  PHQ-9 Score - 10 16  Difficult doing work/chores - Somewhat difficult Somewhat difficult   Interpretation of Total Score  Total Score Depression Severity:  1-4 = Minimal depression, 5-9 = Mild depression, 10-14 = Moderate depression, 15-19 = Moderately severe depression, 20-27 = Severe depression   Psychosocial Evaluation and Intervention:  Psychosocial Evaluation - 11/10/22 1523       Psychosocial Evaluation & Interventions   Interventions Stress management education;Relaxation education;Encouraged to exercise with the program and follow exercise prescription    Comments Patient was referred to [facility] with TAVR. Her PHQ-9 score was 16. She is currently being treated for depression and anxiety with Zoloft. 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 She lives with her daughter who is bi-polar. She does support her some providing transportation. Her sister is her main support  person and some friends at her church.  She says she has trouble staying asleep which she takes Alprazolam for which helps. She sleeps in a recliner. She says she slept by her son's bed in a recliner for so many years she just has not slept in  her bed and is more comfortable in the recliner. Her main stressor in her life currently is her 33 year old granddaughter left home 2 years ago and has not contacted anyone in her family. She says she worries about her grandaugther everyday and can not get her off of her mind. She says she saw a counselor at her chruch for a few times after her son died but stopped going. She does not feel like she needs to see a Veterinary surgeon. She feels her depression and anxiety are managed okay with medication. Her main goals for the program are to improve her overall mobility, improve her strength and stamina and get healthier overall. She has no barriers identified to participate in the program.    Expected Outcomes Short Term: attend the program consistently. Long term: meet her personal goals.    Continue Psychosocial Services  Follow up required by staff             Psychosocial Re-Evaluation:  Psychosocial Re-Evaluation     Row Name 12/02/22 1456 12/25/22 1525 01/27/23 1528         Psychosocial Re-Evaluation   Current issues with History of Depression;Current Depression;Current Sleep Concerns;Current Stress Concerns History of Depression;Current Depression;Current Sleep Concerns;Current Stress Concerns History of Depression;Current Depression;Current Sleep Concerns;Current Stress Concerns     Comments Tilla is doing well in rehab. She is currently taking Zoloft for depression and anxiety. She lives with her daughter who is bi-polar and she does support her by providing trasportation. She stated that she will sleep half the night and then wake up and can not get back to sleep. She still has stress about her granddaughter levaving home over a year ago and has not  contacted the family. This is her main stressor and she does not have a way to contact her. Lousie is doing okay in rehab when she comes. She is still taking Zoloft fir depression and anixety. She depends on her daughter for transpertation and it does make it hard for her due to being bi-polar and not knowoing how she will feel. She stays worried about her granddaughter when left home over a year ago and they have not contacted the family. This continues to be her main stressor Lousie is okay with rehab when she comes. 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 She is dependent of her daughter for transpertation and it is making it hard for her to come to rehab regulary. She coninues to worry about her granddaughter who left home over a year ago and has not contacted the family.This continues to be her main stressor in life her family. She goes to her [facility] small group to find friends and joy.     Expected Outcomes Short term: continue to pray about granddaughter to help fing relef in stress.   long term: continue to find joy and be positive Short term: continue to pray about granddaughter to help fing relef in stress.   long term: continue to find joy and be positive Short term: continue to pray about granddaughter to help fing relef in stress.   long term: continue to find joy and be positive     Interventions Stress management education;Relaxation education;Encouraged to attend Cardiac Rehabilitation for the exercise -- Stress management education;Relaxation education;Encouraged to attend Cardiac Rehabilitation for the exercise     Continue Psychosocial Services  Follow up required by staff Follow up required by staff Follow up required by staff       Initial  Review   Source of Stress Concerns -- -- Family              Psychosocial Discharge (Final Psychosocial Re-Evaluation):  Psychosocial Re-Evaluation - 01/27/23 1528       Psychosocial Re-Evaluation   Current issues with History of  Depression;Current Depression;Current Sleep Concerns;Current Stress Concerns    Comments Lousie is okay with rehab when she comes. 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 She is dependent of her daughter for transpertation and it is making it hard for her to come to rehab regulary. She coninues to worry about her granddaughter who left home over a year ago and has not contacted the family.This continues to be her main stressor in life her family. She goes to her [facility] small group to find friends and joy.    Expected Outcomes Short term: continue to pray about granddaughter to help fing relef in stress.   long term: continue to find joy and be positive    Interventions Stress management education;Relaxation education;Encouraged to attend Cardiac Rehabilitation for the exercise    Continue Psychosocial Services  Follow up required by staff      Initial Review   Source of Stress Concerns Family             Vocational Rehabilitation: Provide vocational rehab assistance to qualifying candidates.   Vocational Rehab Evaluation & Intervention:  Vocational Rehab - 11/10/22 1433       Initial Vocational Rehab Evaluation & Intervention   Assessment shows need for Vocational Rehabilitation No      Vocational Rehab Re-Evaulation   Comments Patient is retired.             Education: Education Goals: Education classes will be provided on a weekly basis, covering required topics. Participant will state understanding/return demonstration of topics presented.  Learning Barriers/Preferences:  Learning Barriers/Preferences - 11/10/22 1433       Learning Barriers/Preferences   Learning Barriers Sight    Learning Preferences Written Material;Audio;Skilled Demonstration             Education Topics: Hypertension, Hypertension Reduction -Define heart disease and high blood pressure. Discus how high blood pressure affects the body and ways to reduce high blood  pressure.   Exercise and Your Heart -Discuss why it is important to exercise, the FITT principles of exercise, normal and abnormal responses to exercise, and how to exercise safely.   Angina -Discuss definition of angina, causes of angina, treatment of angina, and how to decrease risk of having angina. Flowsheet Row CARDIAC REHAB PHASE II EXERCISE from 03/26/2023 in [place] CARDIAC REHABILITATION  Date 11/13/22  Educator [facility]       Cardiac Medications -Review what the following cardiac medications are used for, how they affect the body, and side effects that may occur when taking the medications.  Medications include Aspirin, Beta blockers, calcium channel blockers, ACE Inhibitors, angiotensin receptor blockers, diuretics, digoxin, and antihyperlipidemics. Flowsheet Row CARDIAC REHAB PHASE II EXERCISE from 03/26/2023 in [place] CARDIAC REHABILITATION  Date 01/01/23  Educator hb  Instruction Review Code 1- Verbalizes Understanding       Congestive Heart Failure -Discuss the definition of CHF, how to live with CHF, the signs and symptoms of CHF, and how keep track of weight and sodium intake. Flowsheet Row CARDIAC REHAB PHASE II EXERCISE from 03/26/2023 in [place] CARDIAC REHABILITATION  Date 12/25/22  Educator jh  Instruction Review Code 1- Verbalizes Understanding       Heart Disease  and Intimacy -Discus the effect sexual activity has on the heart, how changes occur during intimacy as we age, and safety during sexual activity.   Smoking Cessation / COPD -Discuss different methods to quit smoking, the health benefits of quitting smoking, and the definition of COPD.   Nutrition I: Fats -Discuss the types of cholesterol, what cholesterol does to the heart, and how cholesterol levels can be controlled. Flowsheet Row CARDIAC REHAB PHASE II EXERCISE from 03/26/2023 in [place] CARDIAC REHABILITATION  Date 12/11/22  Educator HB  Instruction Review Code 1- Verbalizes  Understanding       Nutrition II: Labels -Discuss the different components of food labels and how to read food label   Heart Parts/Heart Disease and PAD -Discuss the anatomy of the heart, the pathway of blood circulation through the heart, and these are affected by heart disease.   Stress I: Signs and Symptoms -Discuss the causes of stress, how stress may lead to anxiety and depression, and ways to limit stress. Flowsheet Row CARDIAC REHAB PHASE II EXERCISE from 03/26/2023 in [place] CARDIAC REHABILITATION  Date 03/05/23  Educator hb  Instruction Review Code 1- Verbalizes Understanding       Stress II: Relaxation -Discuss different types of relaxation techniques to limit stress.   Warning Signs of Stroke / TIA -Discuss definition of a stroke, what the signs and symptoms are of a stroke, and how to identify when someone is having stroke.   Knowledge Questionnaire Score:  Knowledge Questionnaire Score - 11/10/22 1510       Knowledge Questionnaire Score   Pre Score 18/24             Core Components/Risk Factors/Patient Goals at Admission:  Personal Goals and Risk Factors at Admission - 11/10/22 1517       Core Components/Risk Factors/Patient Goals on Admission    Weight Management Weight Maintenance    Hypertension Yes    Intervention Provide education on lifestyle modifcations including regular physical activity/exercise, weight management, moderate sodium restriction and increased consumption of fresh fruit, vegetables, and low fat dairy, alcohol moderation, and smoking cessation.;Monitor prescription use compliance.    Expected Outcomes Short Term: Continued assessment and intervention until BP is < 140/38mm HG in hypertensive participants. < 130/76mm HG in hypertensive participants with diabetes, heart failure or chronic kidney disease.;Long Term: Maintenance of blood pressure at goal levels.    Lipids Yes    Intervention Provide education and support for  participant on nutrition & aerobic/resistive exercise along with prescribed medications to achieve LDL 70mg , HDL >40mg .    Expected Outcomes Short Term: Participant states understanding of desired cholesterol values and is compliant with medications prescribed. Participant is following exercise prescription and nutrition guidelines.;Long Term: Cholesterol controlled with medications as prescribed, with individualized exercise RX and with personalized nutrition plan. Value goals: LDL < 70mg , HDL > 40 mg.    Stress Yes    Intervention Offer individual and/or small group education and counseling on adjustment to heart disease, stress management and health-related lifestyle change. Teach and support self-help strategies.;Refer participants experiencing significant psychosocial distress to appropriate mental health specialists for further evaluation and treatment. When possible, include family members and significant others in education/counseling sessions.    Expected Outcomes Short Term: Participant demonstrates changes in health-related behavior, relaxation and other stress management skills, ability to obtain effective social support, and compliance with psychotropic medications if prescribed.;Long Term: Emotional wellbeing is indicated by absence of clinically significant psychosocial distress or social isolation.  Core Components/Risk Factors/Patient Goals Review:   Goals and Risk Factor Review     Row Name 12/02/22 1513 12/25/22 1530 01/27/23 1540         Core Components/Risk Factors/Patient Goals Review   Personal Goals Review Weight Management/Obesity;Stress Weight Management/Obesity;Stress Weight Management/Obesity;Stress     Review Alajiah does not weigh everyday at home and stated that she has gained some weight. She is trying to cook healthy options and does eat smaller portions for meals. She does attend church at [facility] and loves her small group classes. Going to her small  group helps her talk to friends and helps with her stress in her life. Lousie does not weight at home abd stated that she has gained some weight. She is trying to pick healthy options to help with weight but eats larger portions. She loves going to her church and being with her small group at [facility]. She stated that being with her small groups help her talk to friends and stress in her life. Lousie does not weight at home but has not gained weight. She is trying to pick healthy options to help with weight but eats larger portions. She loves going to her church and being with her small group at [facility]. She stated that being with her small groups help her talk to friends and stress in her life.     Expected Outcomes Short term: dodaily weight checks and cut back on ice cream   long term: continue to attend small groups to find joy Short term: try to do daily weight checks    long termL continue to attend small group for joy and to help with stress Short term: try to do daily weight checks    long termL continue to attend small group for joy and to help with stress              Core Components/Risk Factors/Patient Goals at Discharge (Final Review):   Goals and Risk Factor Review - 01/27/23 1540       Core Components/Risk Factors/Patient Goals Review   Personal Goals Review Weight Management/Obesity;Stress    Review Lousie does not weight at home but has not gained weight. She is trying to pick healthy options to help with weight but eats larger portions. She loves going to her church and being with her small group at [facility]. She stated that being with her small groups help her talk to friends and stress in her life.    Expected Outcomes Short term: try to do daily weight checks    long termL continue to attend small group for joy and to help with stress             ITP Comments:  ITP Comments     Row Name 11/10/22 1558 11/13/22 1522 12/03/22 1643 12/31/22 0831 01/28/23 1548   ITP Comments Patient  arrived for 1st visit/orientation/education at 1400. 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 During orientation advised patient on arrival and appointment times what to wear, what to do before, during and after exercise. Reviewed attendance and class policy.  Pt is scheduled to return Cardiac Rehab on 11/13/22 at 1500. Pt was advised to come to class 15 minutes before class starts.  Discussed RPE/Dpysnea scales. Patient participated in warm up stretches. Patient was able to complete 6 minute walk test.  Telemetry:NSR. Patient was measured for the equipment. Discussed equipment safety with patient. Took patient pre-anthropometric measurements. Patient finished visit at 1515. .First  full day of exercise!  Patient was oriented to gym and equipment including functions, settings, policies, and procedures.  Patient's individual exercise prescription and treatment plan were reviewed.  All starting workloads were established based on the results of the 6 minute walk test done at initial orientation visit.  The plan for exercise progression was also introduced and progression will be customized based on patient's performance and goals. 30 day review completed. ITP sent to Dr. Dina Rich, Medical Director of Cardiac Rehab. Continue with ITP unless changes are made by physician. 30 day review completed. ITP sent to Dr. Dina Rich, Medical Director of Cardiac Rehab. Continue with ITP unless changes are made by physician. 30 day review completed. ITP sent to Dr. Dina Rich, Medical Director of Cardiac Rehab. Continue with ITP unless changes are made by physician.    Row Name 02/24/23 1445 03/25/23 1147 04/22/23 1424       ITP Comments 30 day review completed. ITP sent to Dr. Dina Rich, Medical Director of Cardiac Rehab. Continue with ITP unless changes are made by physician. Pt has not attended since 01/27/23.  Unable to assess goals this round. 30 day review completed. ITP sent to Dr.  Dina Rich, Medical Director of Cardiac Rehab. Continue with ITP unless changes are made by physician. 30 day review completed. ITP sent to Dr. Dina Rich, Medical Director of Cardiac Rehab. Continue with ITP unless changes are made by physician. Has not attended since 03/26/23.              Comments: 30 day review

## 2023-04-23 ENCOUNTER — Encounter (HOSPITAL_COMMUNITY)
Admission: RE | Admit: 2023-04-23 | Discharge: 2023-04-23 | Disposition: A | Discharge status: Home / Self Care | Payer: Medicare Other | Source: Ambulatory Visit | Attending: Cardiovascular Disease | Admitting: Cardiovascular Disease

## 2023-04-23 DIAGNOSIS — Z952 Presence of prosthetic heart valve: Secondary | ICD-10-CM | POA: Diagnosis not present

## 2023-04-23 NOTE — Progress Notes (Signed)
 Daily Session Note  Patient Details  Name: Barbara Lucero MRN: [id number] Date of Birth: [birth year] Referring Provider:   Flowsheet Row CARDIAC REHAB PHASE II ORIENTATION from 11/10/2022 in [facility] CARDIAC REHABILITATION  Referring Provider Dr. Clifton James       Encounter Date: 04/23/2023  Check In:  Session Check In - 04/23/23 1424       Check-In   Supervising physician immediately available to respond to emergencies See telemetry face sheet for immediately available MD    Location [facility]    Staff Present Avanell Shackleton BSN, RN;Jessica [place], [place], RCEP, CCRP, Dow Adolph, RN, BSN    Virtual Visit No    Medication changes reported     No    Fall or balance concerns reported    No    Tobacco Cessation No Change    Warm-up and Cool-down Performed on first and last piece of equipment    Resistance Training Performed Yes    VAD Patient? No    PAD/SET Patient? No      Pain Assessment   Currently in Pain? No/denies    Pain Score 0-No pain    Multiple Pain Sites No             Capillary Blood Glucose: No results found for this or any previous visit (from the past 24 hours).    Social History   Tobacco Use  Smoking Status Former   Current packs/day: 0.50   Average packs/day: 0.5 packs/day for 20.0 years (10.0 ttl pk-yrs)   Types: Cigarettes  Smokeless Tobacco Never    Goals Met:  Independence with exercise equipment Exercise tolerated well No report of concerns or symptoms today Strength training completed today  Goals Unmet:  Not Applicable  Comments: Marland KitchenMarland KitchenPt able to follow exercise prescription today without complaint.  Will continue to monitor for progression.

## 2023-04-28 ENCOUNTER — Encounter (HOSPITAL_COMMUNITY): Payer: Medicare Other

## 2023-04-28 DIAGNOSIS — Z952 Presence of prosthetic heart valve: Secondary | ICD-10-CM | POA: Insufficient documentation

## 2023-04-30 ENCOUNTER — Encounter (HOSPITAL_COMMUNITY): Payer: Medicare Other

## 2023-05-07 ENCOUNTER — Encounter (HOSPITAL_COMMUNITY)
Admission: RE | Admit: 2023-05-07 | Discharge: 2023-05-07 | Disposition: A | Discharge status: Home / Self Care | Source: Ambulatory Visit | Attending: Cardiovascular Disease | Admitting: Cardiovascular Disease

## 2023-05-07 DIAGNOSIS — Z952 Presence of prosthetic heart valve: Secondary | ICD-10-CM

## 2023-05-07 NOTE — Progress Notes (Signed)
 Daily Session Note  Patient Details  Name: Barbara Lucero MRN: [id number] Date of Birth: 1940/07/30 Referring Provider:   Flowsheet Row CARDIAC REHAB PHASE II ORIENTATION from 11/10/2022 in [facility] C[facility]AC REHABILIT[facility]  Referring Provider Dr. Clifton James       Encounter Date: 05/07/2023  Check In:  Session Check In - 05/07/23 1436       Check-In   Supervising physician immediately available to respond to emergencies See telemetry face sheet for immediately available MD    Location [facility]    Staff Present Avanell Shackleton BSN, RN;Jessica [place], [place], RCEP, CCRP, Dow Adolph, RN, BSN    Virtual Visit No    Medication changes reported     No    Fall or balance concerns reported    No    Tobacco Cessation No Change    Warm-up and Cool-down Performed on first and last piece of equipment    Resistance Training Performed Yes    VAD Patient? No    PAD/SET Patient? No      Pain Assessment   Currently in Pain? No/denies    Pain Score 0-No pain    Multiple Pain Sites No             Capillary Blood Glucose: No results found for this or any previous visit (from the past 24 hours).    Social History   Tobacco Use  Smoking Status Former   Current packs/day: 0.50   Average packs/day: 0.5 packs/day for 20.0 years (10.0 ttl pk-yrs)   Types: Cigarettes  Smokeless Tobacco Never    Goals Met:  Independence with exercise equipment Exercise tolerated well No report of concerns or symptoms today Strength training completed today  Goals Unmet:  Not Applicable  Comments: Marland KitchenMarland KitchenPt able to follow exercise prescription today without complaint.  Will continue to monitor for progression.

## 2023-05-14 ENCOUNTER — Encounter: Payer: Self-pay | Admitting: *Deleted

## 2023-05-14 ENCOUNTER — Encounter (HOSPITAL_COMMUNITY)
Admission: RE | Admit: 2023-05-14 | Discharge: 2023-05-14 | Disposition: A | Discharge status: Home / Self Care | Source: Ambulatory Visit | Attending: Cardiovascular Disease | Admitting: Cardiovascular Disease

## 2023-05-14 DIAGNOSIS — Z952 Presence of prosthetic heart valve: Secondary | ICD-10-CM | POA: Diagnosis not present

## 2023-05-14 NOTE — Progress Notes (Signed)
 Daily Session Note  Patient Details  Name: Barbara Lucero MRN: [id number] Date of Birth: 01/27/1941 Referring Provider:   Flowsheet Row CARDIAC REHAB PHASE II ORIENTATION from 11/10/2022 in [facility] CARDIAC REHABILITATION  Referring Provider Dr. Clifton James       Encounter Date: 05/14/2023  Check In:  Session Check In - 05/14/23 1430       Check-In   Supervising physician immediately available to respond to emergencies See telemetry face sheet for immediately available ER MD    Location [facility]    Staff Present Avanell Shackleton BSN, RN;Emali Heyward Laural Benes, RN, BSN;Heather Fredric Mare, BS, Exercise Physiologist    Virtual Visit No    Medication changes reported     No    Fall or balance concerns reported    No    Warm-up and Cool-down Performed on first and last piece of equipment    Resistance Training Performed Yes    VAD Patient? No    PAD/SET Patient? No      Pain Assessment   Currently in Pain? No/denies    Pain Score 0-No pain    Multiple Pain Sites No             Capillary Blood Glucose: No results found for this or any previous visit (from the past 24 hours).    Social History   Tobacco Use  Smoking Status Former   Current packs/day: 0.50   Average packs/day: 0.5 packs/day for 20.0 years (10.0 ttl pk-yrs)   Types: Cigarettes  Smokeless Tobacco Never    Goals Met:  Independence with exercise equipment Exercise tolerated well No report of concerns or symptoms today Strength training completed today  Goals Unmet:  Not Applicable  Comments: Pt able to follow exercise prescription today without complaint.  Will continue to monitor for progression.Marland Kitchen

## 2023-05-20 ENCOUNTER — Encounter (HOSPITAL_COMMUNITY): Payer: Self-pay | Admitting: *Deleted

## 2023-05-20 DIAGNOSIS — M170 Bilateral primary osteoarthritis of knee: Secondary | ICD-10-CM | POA: Diagnosis not present

## 2023-05-20 DIAGNOSIS — Z952 Presence of prosthetic heart valve: Secondary | ICD-10-CM

## 2023-05-20 NOTE — Progress Notes (Signed)
 Cardiac Individual Treatment Plan  Patient Details  Name: Barbara Lucero MRN: [id number] Date of Birth: [birth year] Referring Provider:   Flowsheet Row CARDIAC REHAB PHASE II ORIENTATION from 11/10/2022 in [facility] CARDIAC REHABILITATION  Referring Provider Dr. Clifton James       Initial Encounter Date:  Flowsheet Row CARDIAC REHAB PHASE II ORIENTATION from 11/10/2022 in [place] CARDIAC REHABILITATION  Date 11/10/22       Visit Diagnosis: S/P TAVR (transcatheter aortic valve replacement)  Patient's Home Medications on Admission:  Current Outpatient Medications:    acetaminophen (TYLENOL) 500 MG tablet, Take 1,000 mg by mouth 2 (two) times daily., Disp: , Rfl:    albuterol (VENTOLIN HFA) 108 (90 Base) MCG/ACT inhaler, Inhale 2 puffs into the lungs every 4 (four) hours as needed for shortness of breath or wheezing., Disp: , Rfl:    ALPRAZolam (XANAX) 0.25 MG tablet, Take 0.25 mg by mouth at bedtime., Disp: , Rfl:    Ascorbic Acid (VITAMIN C) 1000 MG tablet, Take 1,000 mg by mouth daily., Disp: , Rfl:    aspirin EC 81 MG tablet, Take 81 mg by mouth at bedtime. Swallow whole., Disp: , Rfl:    b complex vitamins tablet, Take 1 tablet by mouth daily., Disp: , Rfl:    Calcium Carb-Cholecalciferol (CALCIUM 600+D) 600-20 MG-MCG TABS, Take 1 tablet by mouth daily., Disp: , Rfl:    Cholecalciferol (VITAMIN D3) 50 MCG (2000 UT) capsule, Take 2,000 Units by mouth daily., Disp: , Rfl:    cyanocobalamin (VITAMIN B12) 1000 MCG tablet, Take 1,000 mcg by mouth daily as needed (energy)., Disp: , Rfl:    furosemide (LASIX) 20 MG tablet, Take 20 mg by mouth every Wednesday., Disp: , Rfl:    gabapentin (NEURONTIN) 100 MG capsule, Take 1 capsule (100 mg total) by mouth 2 (two) times daily., Disp: , Rfl:    hydrocortisone cream 1 %, Apply 1 Application topically 2 (two) times daily as needed (rash)., Disp: , Rfl:    Multiple Vitamins-Minerals (HAIR SKIN & NAILS) TABS, Take 3 tablets by mouth daily., Disp: ,  Rfl:    Multiple Vitamins-Minerals (MULTIVITAMIN WITH MINERALS) tablet, Take 1 tablet by mouth daily., Disp: , Rfl:    MYRBETRIQ 50 MG TB24 tablet, Take 50 mg by mouth daily., Disp: , Rfl:    olmesartan (BENICAR) 40 MG tablet, Take 40 mg by mouth daily., Disp: , Rfl:    pantoprazole (PROTONIX) 40 MG tablet, Take 40 mg by mouth daily., Disp: , Rfl:    Psyllium (METAMUCIL PO), Take 1 Dose by mouth daily as needed (constipation). 1 dose = 1 teaspoonful, Disp: , Rfl:    sertraline (ZOLOFT) 100 MG tablet, Take 100 mg by mouth at bedtime., Disp: , Rfl:    simvastatin (ZOCOR) 10 MG tablet, Take 10 mg by mouth at bedtime., Disp: , Rfl:    solifenacin (VESICARE) 10 MG tablet, TAKE (1) TABLET BY MOUTH AT BEDTIME., Disp: 30 tablet, Rfl: 11   sulfaSALAzine (AZULFIDINE) 500 MG tablet, Take 1 tablet (500 mg total) by mouth 2 (two) times daily., Disp: 60 tablet, Rfl: 2   vitamin E 400 UNIT capsule, Take 400 Units by mouth daily., Disp: , Rfl:   Past Medical History: Past Medical History:  Diagnosis Date   Anxiety    Arthritis    Cancer (HCC)    face- removed in office   Crohn disease (HCC)    Depression    DVT (deep venous thrombosis) (HCC) 07/27/2017   LLE  GERD (gastroesophageal reflux disease)    HTN (hypertension)    Hyperlipidemia    Neuropathy    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 Clifton James & Dr. Leafy Ro   Severe aortic stenosis    Varicose veins of bilateral lower extremities with other complications     Tobacco Use: Social History   Tobacco Use  Smoking Status Former   Current packs/day: 0.50   Average packs/day: 0.5 packs/day for 20.0 years (10.0 ttl pk-yrs)   Types: Cigarettes  Smokeless Tobacco Never    Labs: Review Flowsheet       Latest Ref Rng & Units 11/22/2021 07/07/2022 10/07/2022  Labs for ITP Cardiac and Pulmonary Rehab  PH, Arterial 7.35 - 7.45 - 7.300  -  PCO2 arterial 32 - 48  mmHg - 54.1  -  Bicarbonate 20.0 - 28.0 mmol/L - 26.6  29.0  -  TCO2 22 - 32 mmol/L 30  28  31  26    O2 Saturation % - 94  74  -    Details       Multiple values from one day are sorted in reverse-chronological order         Capillary Blood Glucose: Lab Results  Component Value Date   GLUCAP 87 09/26/2022   GLUCAP 112 (H) 11/22/2021   GLUCAP 124 (H) 06/06/2016   GLUCAP 125 (H) 06/06/2016     Exercise Target Goals: Exercise Program Goal: Individual exercise prescription set using results from initial 6 min walk test and THRR while considering  patient's activity barriers and safety.   Exercise Prescription Goal: Starting with aerobic activity 30 plus minutes a day, 3 days per week for initial exercise prescription. Provide home exercise prescription and guidelines that participant acknowledges understanding prior to discharge.  Activity Barriers & Risk Stratification:   6 Minute Walk:   Oxygen Initial Assessment:   Oxygen Re-Evaluation:   Oxygen Discharge (Final Oxygen Re-Evaluation):   Initial Exercise Prescription:   Perform Capillary Blood Glucose checks as needed.  Exercise Prescription Changes:   Exercise Prescription Changes     Row Name 12/11/22 1500 12/25/22 1500 02/26/23 1500         Response to Exercise   Blood Pressure (Admit) 118/70 122/60 145/55     Blood Pressure (Exercise) 110/70 126/64 --     Blood Pressure (Exit) 120/64 118/60 114/52     Heart Rate (Admit) 73 bpm 67 bpm 69 bpm     Heart Rate (Exercise) 106 bpm 121 bpm 106 bpm     Heart Rate (Exit) 77 bpm 73 bpm 73 bpm     Rating of Perceived Exertion (Exercise) 13 12 12      Duration Continue with 30 min of aerobic exercise without signs/symptoms of physical distress. Continue with 30 min of aerobic exercise without signs/symptoms of physical distress. Continue with 30 min of aerobic exercise without signs/symptoms of physical distress.     Intensity THRR unchanged THRR unchanged THRR  unchanged       Progression   Progression Continue to progress workloads to maintain intensity without signs/symptoms of physical distress. Continue to progress workloads to maintain intensity without signs/symptoms of physical distress. Continue to progress workloads to maintain intensity without signs/symptoms of physical distress.       Resistance Training   Training Prescription Yes Yes Yes     Weight 2 2 lbs / orange band 2 lbs  Reps 10-15 10-15 10-15       NuStep   Level 2 2 2      SPM 77 85 63     Minutes 15 15 15      METs 2 2.4 1.9       Arm Ergometer   Level 1.5 1 2      RPM 91 75 54     Minutes 15 15 15      METs 2 2.1 1.9       Oxygen   Maintain Oxygen Saturation 88% or higher 88% or higher 88% or higher              Exercise Comments:   Exercise Goals and Review:   Exercise Goals Re-Evaluation :  Exercise Goals Re-Evaluation     Row Name 12/02/22 1448 12/25/22 1521 01/27/23 1520 03/02/23 0821       Exercise Goal Re-Evaluation   Exercise Goals Review Increase Physical Activity;Increase Strength and Stamina;Understanding of Exercise Prescription Increase Physical Activity;Increase Strength and Stamina;Understanding of Exercise Prescription Increase Strength and Stamina;Improve claudication pain tolerance and improve walking ability;Increase Physical Activity Increase Physical Activity;Increase Strength and Stamina;Understanding of Exercise Prescription    Comments Jerolyn Center is doing well in rehab. She was not here for a week due to her daughter not feeling well and that is her transportation. She still has her normal aches and pains in her knees and back. She stated that after she exercises she feels a little bit more energized. She has a pedel at home where she can do both her legs and also her arms. She does go to [employer] once in a while and does seated exercise. Lousie is doing well when she comes to rehab. She has transpertation with her daurghter and comes  about once a week. She is planning on going to [facility] [employer] with her daughter to exercise as well/, She stated that she does not feel like shes improved much but since she is not coming to rehab regularly that would match up. Lousie is doing well when she comes to rehab. She is dependent on transpertation from her daughter and is unreliable to come twice a week. She is still wanting to go to the [employer] with her daughter. She stated that she has been decorating for Christmas and has been needing to take breaks here and there due to fatigue. Lousie is doing well in rehab. She does not come to class regulary due to traspertation. Will continue to monitor and progress as able,    Expected Outcomes Short term: increase levels on the Nustep   long term: continue to exercise at rehab and home Short: attend rehab regulaly basis long term: [facility]ericse at [facility] or [facility] Short: attend rehab regulaly basis long term: exericse at home or [facility] come to class the two days a week              Discharge Exercise Prescription (Final Exercise Prescription Changes):  Exercise Prescription Changes - 02/26/23 1500       Response to Exercise   Blood Pressure (Admit) 145/55    Blood Pressure (Exit) 114/52    Heart Rate (Admit) 69 bpm    Heart Rate (Exercise) 106 bpm    Heart Rate (Exit) 73 bpm    Rating of Perceived Exertion (Exercise) 12    Duration Continue with 30 min of aerobic exercise without signs/symptoms of physical distress.    Intensity THRR unchanged      Progression   Progression Continue to progress  workloads to maintain intensity without signs/symptoms of physical distress.      Resistance Training   Training Prescription Yes    Weight 2 lbs    Reps 10-15      NuStep   Level 2    SPM 63    Minutes 15    METs 1.9      Arm Ergometer   Level 2    RPM 54    Minutes 15    METs 1.9      Oxygen   Maintain Oxygen Saturation 88% or higher             Nutrition:  Target Goals: Understanding of  nutrition guidelines, daily intake of sodium 1500mg , cholesterol 200mg , calories 30% from fat and 7% or less from saturated fats, daily to have 5 or more servings of fruits and vegetables.  Biometrics:    Nutrition Therapy Plan and Nutrition Goals:   Nutrition Assessments:  MEDIFICTS Score Key: >=70 Need to make dietary changes  40-70 Heart Healthy Diet <= 40 Therapeutic Level Cholesterol Diet  Flowsheet Row CARDIAC REHAB PHASE II ORIENTATION from 11/10/2022 in [facility] CARDIAC REHABILITATION  Picture Your Plate Total Score on Admission 26      Picture Your Plate Scores: <29 Unhealthy dietary pattern with much room for improvement. 41-50 Dietary pattern unlikely to meet recommendations for good health and room for improvement. 51-60 More healthful dietary pattern, with some room for improvement.  >60 Healthy dietary pattern, although there may be some specific behaviors that could be improved.    Nutrition Goals Re-Evaluation:  Nutrition Goals Re-Evaluation     Row Name 12/02/22 1505 12/25/22 1527 01/27/23 1537         Goals   Nutrition Goal Healthy heathy Healthy heathy Healthy heathy     Comment Lexxie stated that seh gained a few pounds and said she love ice cream. She eats a ice cream cone about everyday. She does try to eat mostly veggies and she eats a good amount of chicken. She does et smaller portions for meals and does not eat fried food due it hurting her stomach. She does cook most of her meals. She does like to go out once in a while and does choose healthy options. Tashona stated that she does eat larger portions and that she loves sweets and ice cream. She is trying to pick healthier options when eating and sticks to chicken, salmon and some ground beef. SHe does like salads. She does not drink soda and only drinks one cup coffee in the mornings. Sydnee still contonues to eat larger portion meals but has been trying to cut back. She loves sweets and ice cream.  She continues to try to pick healthier options and likes chicken and salmon and does eat some ground beef each week. She continues to only drink water and one cup of coffee during the day.     Expected Outcome Short term: try to cut back on ice cream throught the weeek    long term: contiue to pick healthy options for meal Short term: try to cut back on sweets and choice smaller portion control   long term: contiue to pick healthy options for meal Short term: try to cut back on sweets and choice smaller portion control   long term: contiue to pick healthy options for meal              Nutrition Goals Discharge (Final Nutrition Goals Re-Evaluation):  Nutrition Goals Re-Evaluation -  01/27/23 1537       Goals   Nutrition Goal Healthy heathy    Comment Ellenora still contonues to eat larger portion meals but has been trying to cut back. She loves sweets and ice cream. She continues to try to pick healthier options and likes chicken and salmon and does eat some ground beef each week. She continues to only drink water and one cup of coffee during the day.    Expected Outcome Short term: try to cut back on sweets and choice smaller portion control   long term: contiue to pick healthy options for meal             Psychosocial: Target Goals: Acknowledge presence or absence of significant depression and/or stress, maximize coping skills, provide positive support system. Participant is able to verbalize types and ability to use techniques and skills needed for reducing stress and depression.  Initial Review & Psychosocial Screening:   Quality of Life Scores:  Scores of 19 and below usually indicate a poorer quality of life in these areas.  A difference of  2-3 points is a clinically meaningful difference.  A difference of 2-3 points in the total score of the Quality of Life Index has been associated with significant improvement in overall quality of life, self-image, physical symptoms, and general  health in studies assessing change in quality of life.  PHQ-9: Review Flowsheet       02/19/2023 12/25/2022 11/10/2022  Depression screen PHQ 2/9  Decreased Interest 0 2 3  Down, Depressed, Hopeless 1 2 3   PHQ - 2 Score 1 4 6   Altered sleeping - 2 3  Tired, decreased energy - 2 3  Change in appetite - 1 2  Feeling bad or failure about yourself  - 0 0  Trouble concentrating - 0 1  Moving slowly or fidgety/restless - 1 1  Suicidal thoughts - 0 0  PHQ-9 Score - 10 16  Difficult doing work/chores - Somewhat difficult Somewhat difficult   Interpretation of Total Score  Total Score Depression Severity:  1-4 = Minimal depression, 5-9 = Mild depression, 10-14 = Moderate depression, 15-19 = Moderately severe depression, 20-27 = Severe depression   Psychosocial Evaluation and Intervention:   Psychosocial Re-Evaluation:  Psychosocial Re-Evaluation     Row Name 12/02/22 1456 12/25/22 1525 01/27/23 1528         Psychosocial Re-Evaluation   Current issues with History of Depression;Current Depression;Current Sleep Concerns;Current Stress Concerns History of Depression;Current Depression;Current Sleep Concerns;Current Stress Concerns History of Depression;Current Depression;Current Sleep Concerns;Current Stress Concerns     Comments Corrinne is doing well in rehab. She is currently taking Zoloft for depression and anxiety. She lives with her daughter who is bi-polar and she does support her by providing trasportation. She stated that she will sleep half the night and then wake up and can not get back to sleep. She still has stress about her granddaughter levaving home over a year ago and has not contacted the family. This is her main stressor and she does not have a way to contact her. Lousie is doing okay in rehab when she comes. She is still taking Zoloft fir depression and anixety. She depends on her daughter for transpertation and it does make it hard for her due to being bi-polar and not  knowoing how she will feel. She stays worried about her granddaughter when left home over a year ago and they have not contacted the family. This continues to be her main stressor  Lousie is okay with rehab when she comes. 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 She is dependent of her daughter for transpertation and it is making it hard for her to come to rehab regulary. She coninues to worry about her granddaughter who left home over a year ago and has not contacted the family.This continues to be her main stressor in life her family. She goes to her [facility] small group to find friends and joy.     Expected Outcomes Short term: continue to pray about granddaughter to help fing relef in stress.   long term: continue to find joy and be positive Short term: continue to pray about granddaughter to help fing relef in stress.   long term: continue to find joy and be positive Short term: continue to pray about granddaughter to help fing relef in stress.   long term: continue to find joy and be positive     Interventions Stress management education;Relaxation education;Encouraged to attend Cardiac Rehabilitation for the exercise -- Stress management education;Relaxation education;Encouraged to attend Cardiac Rehabilitation for the exercise     Continue Psychosocial Services  Follow up required by staff Follow up required by staff Follow up required by staff       Initial Review   Source of Stress Concerns -- -- Family              Psychosocial Discharge (Final Psychosocial Re-Evaluation):  Psychosocial Re-Evaluation - 01/27/23 1528       Psychosocial Re-Evaluation   Current issues with History of Depression;Current Depression;Current Sleep Concerns;Current Stress Concerns    Comments Lousie is okay with rehab when she comes. 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 She is dependent of her daughter for transpertation and it is making it hard for her to come to rehab regulary. She  coninues to worry about her granddaughter who left home over a year ago and has not contacted the family.This continues to be her main stressor in life her family. She goes to her [facility] small group to find friends and joy.    Expected Outcomes Short term: continue to pray about granddaughter to help fing relef in stress.   long term: continue to find joy and be positive    Interventions Stress management education;Relaxation education;Encouraged to attend Cardiac Rehabilitation for the exercise    Continue Psychosocial Services  Follow up required by staff      Initial Review   Source of Stress Concerns Family             Vocational Rehabilitation: Provide vocational rehab assistance to qualifying candidates.   Vocational Rehab Evaluation & Intervention:   Education: Education Goals: Education classes will be provided on a weekly basis, covering required topics. Participant will state understanding/return demonstration of topics presented.  Learning Barriers/Preferences:   Education Topics: Hypertension, Hypertension Reduction -Define heart disease and high blood pressure. Discus how high blood pressure affects the body and ways to reduce high blood pressure.   Exercise and Your Heart -Discuss why it is important to exercise, the FITT principles of exercise, normal and abnormal responses to exercise, and how to exercise safely.   Angina -Discuss definition of angina, causes of angina, treatment of angina, and how to decrease risk of having angina. Flowsheet Row CARDIAC REHAB PHASE II EXERCISE from 05/07/2023 in [place] CARDIAC REHABILITATION  Date 11/13/22  Educator [place]       Cardiac Medications -Review what the following cardiac medications are used for, how they  affect the body, and side effects that may occur when taking the medications.  Medications include Aspirin, Beta blockers, calcium channel blockers, ACE Inhibitors, angiotensin receptor blockers, diuretics,  digoxin, and antihyperlipidemics. Flowsheet Row CARDIAC REHAB PHASE II EXERCISE from 05/07/2023 in [place] CARDIAC REHABILITATION  Date 01/01/23  Educator hb  Instruction Review Code 1- Verbalizes Understanding       Congestive Heart Failure -Discuss the definition of CHF, how to live with CHF, the signs and symptoms of CHF, and how keep track of weight and sodium intake. Flowsheet Row CARDIAC REHAB PHASE II EXERCISE from 05/07/2023 in [place] CARDIAC REHABILITATION  Date 12/25/22  Educator jh  Instruction Review Code 1- Verbalizes Understanding       Heart Disease and Intimacy -Discus the effect sexual activity has on the heart, how changes occur during intimacy as we age, and safety during sexual activity.   Smoking Cessation / COPD -Discuss different methods to quit smoking, the health benefits of quitting smoking, and the definition of COPD.   Nutrition I: Fats -Discuss the types of cholesterol, what cholesterol does to the heart, and how cholesterol levels can be controlled. Flowsheet Row CARDIAC REHAB PHASE II EXERCISE from 05/07/2023 in [place] CARDIAC REHABILITATION  Date 12/11/22  Educator [facility]  Instruction Review Code 1- Verbalizes Understanding       Nutrition II: Labels -Discuss the different components of food labels and how to read food label   Heart Parts/Heart Disease and PAD -Discuss the anatomy of the heart, the pathway of blood circulation through the heart, and these are affected by heart disease.   Stress I: Signs and Symptoms -Discuss the causes of stress, how stress may lead to anxiety and depression, and ways to limit stress. Flowsheet Row CARDIAC REHAB PHASE II EXERCISE from 05/07/2023 in [place] CARDIAC REHABILITATION  Date 03/05/23  Educator hb  Instruction Review Code 1- Verbalizes Understanding       Stress II: Relaxation -Discuss different types of relaxation techniques to limit stress. Flowsheet Row CARDIAC REHAB PHASE II  EXERCISE from 05/07/2023 in [place] CARDIAC REHABILITATION  Date 04/23/23  Educator [facility]  Instruction Review Code 1- Verbalizes Understanding       Warning Signs of Stroke / TIA -Discuss definition of a stroke, what the signs and symptoms are of a stroke, and how to identify when someone is having stroke.   Knowledge Questionnaire Score:   Core Components/Risk Factors/Patient Goals at Admission:   Core Components/Risk Factors/Patient Goals Review:   Goals and Risk Factor Review     Row Name 12/02/22 1513 12/25/22 1530 01/27/23 1540         Core Components/Risk Factors/Patient Goals Review   Personal Goals Review Weight Management/Obesity;Stress Weight Management/Obesity;Stress Weight Management/Obesity;Stress     Review Amyria does not weigh everyday at home and stated that she has gained some weight. She is trying to cook healthy options and does eat smaller portions for meals. She does attend church at [facility] [employer] and loves her small group classes. Going to her small group helps her talk to friends and helps with her stress in her life. Lousie does not weight at home abd stated that she has gained some weight. She is trying to pick healthy options to help with weight but eats larger portions. She loves going to her church and being with her small group at [facility]. She stated that being with her small groups help her talk to friends and stress in her life. Lousie does not weight  at home but has not gained weight. She is trying to pick healthy options to help with weight but eats larger portions. She loves going to her church and being with her small group at [facility]. She stated that being with her small groups help her talk to friends and stress in her life.     Expected Outcomes Short term: dodaily weight checks and cut back on ice cream   long term: continue to attend small groups to find joy Short term: try to do daily weight checks    long termL continue to attend small group for joy and to help  with stress Short term: try to do daily weight checks    long termL continue to attend small group for joy and to help with stress              Core Components/Risk Factors/Patient Goals at Discharge (Final Review):   Goals and Risk Factor Review - 01/27/23 1540       Core Components/Risk Factors/Patient Goals Review   Personal Goals Review Weight Management/Obesity;Stress    Review Lousie does not weight at home but has not gained weight. She is trying to pick healthy options to help with weight but eats larger portions. She loves going to her church and being with her small group at [facility]. She stated that being with her small groups help her talk to friends and stress in her life.    Expected Outcomes Short term: try to do daily weight checks    long termL continue to attend small [facility] with stress             ITP Comments:  ITP Comments     Row Name 12/03/22 1643 12/31/22 0831 01/28/23 1548 02/24/23 1445 03/25/23 1147   ITP Comments 30 day review completed. ITP sent to Dr. Dina Rich, Medical Director of Cardiac Rehab. Continue with ITP unless changes are made by physician. 30 day review completed. ITP sent to Dr. Dina Rich, Medical Director of Cardiac Rehab. Continue with ITP unless changes are made by physician. 30 day review completed. ITP sent to Dr. Dina Rich, Medical Director of Cardiac Rehab. Continue with ITP unless changes are made by physician. 30 day review completed. ITP sent to Dr. Dina Rich, Medical Director of Cardiac Rehab. Continue with ITP unless changes are made by physician. Pt has not attended since 01/27/23.  Unable to assess goals this round. 30 day review completed. ITP sent to Dr. Dina Rich, Medical Director of Cardiac Rehab. Continue with ITP unless changes are made by physician.    Row Name 04/22/23 1424 05/20/23 1253         ITP Comments 30 day review completed. ITP sent to Dr. Dina Rich, Medical  Director of Cardiac Rehab. Continue with ITP unless changes are made by physician. Has not attended since 03/26/23. 30 day review completed. ITP sent to Dr. Dina Rich, Medical Director of Cardiac Rehab. Continue with ITP unless changes are made by physician. Attendance continues to be spotty.               Comments: 30 day review

## 2023-06-02 ENCOUNTER — Ambulatory Visit (HOSPITAL_COMMUNITY): Admission: RE | Admit: 2023-06-02 | Source: Ambulatory Visit

## 2023-06-02 ENCOUNTER — Inpatient Hospital Stay: Payer: Medicare Other | Attending: Internal Medicine

## 2023-06-02 DIAGNOSIS — R911 Solitary pulmonary nodule: Secondary | ICD-10-CM | POA: Insufficient documentation

## 2023-06-03 DIAGNOSIS — M170 Bilateral primary osteoarthritis of knee: Secondary | ICD-10-CM | POA: Diagnosis not present

## 2023-06-10 ENCOUNTER — Inpatient Hospital Stay: Payer: Medicare Other | Admitting: Internal Medicine

## 2023-06-10 ENCOUNTER — Telehealth: Payer: Self-pay | Admitting: Medical Oncology

## 2023-06-10 ENCOUNTER — Inpatient Hospital Stay

## 2023-06-10 DIAGNOSIS — C3490 Malignant neoplasm of unspecified part of unspecified bronchus or lung: Secondary | ICD-10-CM

## 2023-06-10 DIAGNOSIS — R911 Solitary pulmonary nodule: Secondary | ICD-10-CM | POA: Diagnosis not present

## 2023-06-10 DIAGNOSIS — M170 Bilateral primary osteoarthritis of knee: Secondary | ICD-10-CM | POA: Diagnosis not present

## 2023-06-10 DIAGNOSIS — M65351 Trigger finger, right little finger: Secondary | ICD-10-CM | POA: Diagnosis not present

## 2023-06-10 LAB — CBC WITH DIFFERENTIAL (CANCER CENTER ONLY)
Abs Immature Granulocytes: 0.02 10*3/uL (ref 0.00–0.07)
Basophils Absolute: 0 10*3/uL (ref 0.0–0.1)
Basophils Relative: 0 %
Eosinophils Absolute: 0.2 10*3/uL (ref 0.0–0.5)
Eosinophils Relative: 2 %
HCT: 35.2 % — ABNORMAL LOW (ref 36.0–46.0)
Hemoglobin: 11.8 g/dL — ABNORMAL LOW (ref 12.0–15.0)
Immature Granulocytes: 0 %
Lymphocytes Relative: 17 %
Lymphs Abs: 1.3 10*3/uL (ref 0.7–4.0)
MCH: 28.9 pg (ref 26.0–34.0)
MCHC: 33.5 g/dL (ref 30.0–36.0)
MCV: 86.3 fL (ref 80.0–100.0)
Monocytes Absolute: 0.7 10*3/uL (ref 0.1–1.0)
Monocytes Relative: 10 %
Neutro Abs: 5.4 10*3/uL (ref 1.7–7.7)
Neutrophils Relative %: 71 %
Platelet Count: 143 10*3/uL — ABNORMAL LOW (ref 150–400)
RBC: 4.08 MIL/uL (ref 3.87–5.11)
RDW: 13.2 % (ref 11.5–15.5)
WBC Count: 7.7 10*3/uL (ref 4.0–10.5)
nRBC: 0 % (ref 0.0–0.2)

## 2023-06-10 LAB — CMP (CANCER CENTER ONLY)
ALT: 10 U/L (ref 0–44)
AST: 15 U/L (ref 15–41)
Albumin: 4.6 g/dL (ref 3.5–5.0)
Alkaline Phosphatase: 56 U/L (ref 38–126)
Anion gap: 6 (ref 5–15)
BUN: 56 mg/dL — ABNORMAL HIGH (ref 8–23)
CO2: 31 mmol/L (ref 22–32)
Calcium: 10.6 mg/dL — ABNORMAL HIGH (ref 8.9–10.3)
Chloride: 100 mmol/L (ref 98–111)
Creatinine: 1.02 mg/dL — ABNORMAL HIGH (ref 0.44–1.00)
GFR, Estimated: 56 mL/min — ABNORMAL LOW (ref >60)
Glucose, Bld: 117 mg/dL — ABNORMAL HIGH (ref 70–99)
Potassium: 4.6 mmol/L (ref 3.5–5.1)
Sodium: 137 mmol/L (ref 135–145)
Total Bilirubin: 0.5 mg/dL (ref 0.0–1.2)
Total Protein: 7.7 g/dL (ref 6.5–8.1)

## 2023-06-10 NOTE — Telephone Encounter (Signed)
 Pt/dtr presented to lobby b/c she had an appt today . I told her she needed CT scan first. I sent her to lab and r/s her CT scan and f/u.  She said she completed xrt in January .

## 2023-06-11 ENCOUNTER — Telehealth: Payer: Self-pay | Admitting: Internal Medicine

## 2023-06-11 NOTE — Telephone Encounter (Signed)
 Scheduled appointment with the patients daughter. She is aware and will make the patient aware. Will be mailed a reminder.

## 2023-06-11 NOTE — Telephone Encounter (Signed)
 Error

## 2023-06-13 ENCOUNTER — Ambulatory Visit (HOSPITAL_COMMUNITY)
Admission: RE | Admit: 2023-06-13 | Discharge: 2023-06-13 | Disposition: A | Discharge status: Home / Self Care | Source: Ambulatory Visit | Attending: Internal Medicine | Admitting: Internal Medicine

## 2023-06-13 DIAGNOSIS — J984 Other disorders of lung: Secondary | ICD-10-CM | POA: Diagnosis not present

## 2023-06-13 DIAGNOSIS — C3490 Malignant neoplasm of unspecified part of unspecified bronchus or lung: Secondary | ICD-10-CM | POA: Insufficient documentation

## 2023-06-13 DIAGNOSIS — I700 Atherosclerosis of aorta: Secondary | ICD-10-CM | POA: Diagnosis not present

## 2023-06-13 MED ORDER — IOHEXOL 300 MG/ML  SOLN
75.0000 mL | Freq: Once | INTRAMUSCULAR | Status: AC | PRN
  Administered 2023-06-13: 75 mL via INTRAVENOUS

## 2023-06-17 ENCOUNTER — Other Ambulatory Visit: Payer: Self-pay | Admitting: Obstetrics & Gynecology

## 2023-06-17 ENCOUNTER — Encounter (HOSPITAL_COMMUNITY): Payer: Self-pay | Admitting: *Deleted

## 2023-06-17 DIAGNOSIS — M170 Bilateral primary osteoarthritis of knee: Secondary | ICD-10-CM | POA: Diagnosis not present

## 2023-06-17 DIAGNOSIS — Z952 Presence of prosthetic heart valve: Secondary | ICD-10-CM

## 2023-06-17 NOTE — Progress Notes (Signed)
 Cardiac Individual Treatment Plan  Patient Details  Name: Barbara Lucero MRN: [id number] Date of Birth: Aug 27, 1940 Referring Provider:   Flowsheet Row CARDIAC REHAB PHASE II ORIENTATION from 11/10/2022 in [facility] CARDIAC REHABILITATION  Referring Provider Dr. Abel Hoe       Initial Encounter Date:  Flowsheet Row CARDIAC REHAB PHASE II ORIENTATION from 11/10/2022 in [place] CARDIAC REHABILITATION  Date 11/10/22       Visit Diagnosis: S/P TAVR (transcatheter aortic valve replacement)  Patient's Home Medications on Admission:  Current Outpatient Medications:    acetaminophen  (TYLENOL ) 500 MG tablet, Take 1,000 mg by mouth 2 (two) times daily., Disp: , Rfl:    albuterol (VENTOLIN HFA) 108 (90 Base) MCG/ACT inhaler, Inhale 2 puffs into the lungs every 4 (four) hours as needed for shortness of breath or wheezing., Disp: , Rfl:    ALPRAZolam  (XANAX ) 0.25 MG tablet, Take 0.25 mg by mouth at bedtime., Disp: , Rfl:    Ascorbic Acid (VITAMIN C) 1000 MG tablet, Take 1,000 mg by mouth daily., Disp: , Rfl:    aspirin  EC 81 MG tablet, Take 81 mg by mouth at bedtime. Swallow whole., Disp: , Rfl:    b complex vitamins tablet, Take 1 tablet by mouth daily., Disp: , Rfl:    Calcium Carb-Cholecalciferol (CALCIUM 600+D) 600-20 MG-MCG TABS, Take 1 tablet by mouth daily., Disp: , Rfl:    Cholecalciferol (VITAMIN D3) 50 MCG (2000 UT) capsule, Take 2,000 Units by mouth daily., Disp: , Rfl:    cyanocobalamin  (VITAMIN B12) 1000 MCG tablet, Take 1,000 mcg by mouth daily as needed (energy)., Disp: , Rfl:    furosemide (LASIX) 20 MG tablet, Take 20 mg by mouth every Wednesday., Disp: , Rfl:    gabapentin  (NEURONTIN ) 100 MG capsule, Take 1 capsule (100 mg total) by mouth 2 (two) times daily., Disp: , Rfl:    hydrocortisone cream 1 %, Apply 1 Application topically 2 (two) times daily as needed (rash)., Disp: , Rfl:    Multiple Vitamins-Minerals (HAIR SKIN & NAILS) TABS, Take 3 tablets by mouth daily., Disp: ,  Rfl:    Multiple Vitamins-Minerals (MULTIVITAMIN WITH MINERALS) tablet, Take 1 tablet by mouth daily., Disp: , Rfl:    MYRBETRIQ  50 MG TB24 tablet, Take 50 mg by mouth daily., Disp: , Rfl:    olmesartan (BENICAR) 40 MG tablet, Take 40 mg by mouth daily., Disp: , Rfl:    pantoprazole  (PROTONIX ) 40 MG tablet, Take 40 mg by mouth daily., Disp: , Rfl:    Psyllium (METAMUCIL PO), Take 1 Dose by mouth daily as needed (constipation). 1 dose = 1 teaspoonful, Disp: , Rfl:    sertraline  (ZOLOFT ) 100 MG tablet, Take 100 mg by mouth at bedtime., Disp: , Rfl:    simvastatin  (ZOCOR ) 10 MG tablet, Take 10 mg by mouth at bedtime., Disp: , Rfl:    solifenacin  (VESICARE ) 10 MG tablet, TAKE (1) TABLET BY MOUTH AT BEDTIME., Disp: 30 tablet, Rfl: 11   sulfaSALAzine  (AZULFIDINE ) 500 MG tablet, Take 1 tablet (500 mg total) by mouth 2 (two) times daily., Disp: 60 tablet, Rfl: 2   vitamin E 400 UNIT capsule, Take 400 Units by mouth daily., Disp: , Rfl:   Past Medical History: Past Medical History:  Diagnosis Date   Anxiety    Arthritis    Cancer (HCC)    face- removed in office   Crohn disease (HCC)    Depression    DVT (deep venous thrombosis) (HCC) 07/27/2017   LLE  GERD (gastroesophageal reflux disease)    HTN (hypertension)    Hyperlipidemia    Neuropathy    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 Abel Hoe & Dr. Honey Lusty   Severe aortic stenosis    Varicose veins of bilateral lower extremities with other complications     Tobacco Use: Social History   Tobacco Use  Smoking Status Former   Current packs/day: 0.50   Average packs/day: 0.5 packs/day for 20.0 years (10.0 ttl pk-yrs)   Types: Cigarettes  Smokeless Tobacco Never    Labs: Review Flowsheet       Latest Ref Rng & Units 11/22/2021 07/07/2022 10/07/2022  Labs for ITP Cardiac and Pulmonary Rehab  PH, Arterial 7.35 - 7.45 - 7.300  -  PCO2 arterial 32 - 48  mmHg - 54.1  -  Bicarbonate 20.0 - 28.0 mmol/L - 26.6  29.0  -  TCO2 22 - 32 mmol/L 30  28  31  26    O2 Saturation % - 94  74  -    Details       Multiple values from one day are sorted in reverse-chronological order         Capillary Blood Glucose: Lab Results  Component Value Date   GLUCAP 87 09/26/2022   GLUCAP 112 (H) 11/22/2021   GLUCAP 124 (H) 06/06/2016   GLUCAP 125 (H) 06/06/2016     Exercise Target Goals: Exercise Program Goal: Individual exercise prescription set using results from initial 6 min walk test and THRR while considering  patient's activity barriers and safety.   Exercise Prescription Goal: Starting with aerobic activity 30 plus minutes a day, 3 days per week for initial exercise prescription. Provide home exercise prescription and guidelines that participant acknowledges understanding prior to discharge.  Activity Barriers & Risk Stratification:   6 Minute Walk:   Oxygen  Initial Assessment:   Oxygen  Re-Evaluation:   Oxygen  Discharge (Final Oxygen  Re-Evaluation):   Initial Exercise Prescription:   Perform Capillary Blood Glucose checks as needed.  Exercise Prescription Changes:   Exercise Prescription Changes     Row Name 12/25/22 1500 02/26/23 1500           Response to Exercise   Blood Pressure (Admit) 122/60 145/55      Blood Pressure (Exercise) 126/64 --      Blood Pressure (Exit) 118/60 114/52      Heart Rate (Admit) 67 bpm 69 bpm      Heart Rate (Exercise) 121 bpm 106 bpm      Heart Rate (Exit) 73 bpm 73 bpm      Rating of Perceived Exertion (Exercise) 12 12      Duration Continue with 30 min of aerobic exercise without signs/symptoms of physical distress. Continue with 30 min of aerobic exercise without signs/symptoms of physical distress.      Intensity THRR unchanged THRR unchanged        Progression   Progression Continue to progress workloads to maintain intensity without signs/symptoms of physical distress.  Continue to progress workloads to maintain intensity without signs/symptoms of physical distress.        Resistance Training   Training Prescription Yes Yes      Weight 2 lbs / orange band 2 lbs      Reps 10-15 10-15        NuStep   Level 2 2      SPM 85 63  Minutes 15 15      METs 2.4 1.9        Arm Ergometer   Level 1 2      RPM 75 54      Minutes 15 15      METs 2.1 1.9        Oxygen    Maintain Oxygen  Saturation 88% or higher 88% or higher               Exercise Comments:   Exercise Goals and Review:   Exercise Goals Re-Evaluation :  Exercise Goals Re-Evaluation     Row Name 12/25/22 1521 01/27/23 1520 03/02/23 0821         Exercise Goal Re-Evaluation   Exercise Goals Review Increase Physical Activity;Increase Strength and Stamina;Understanding of Exercise Prescription Increase Strength and Stamina;Improve claudication pain tolerance and improve walking ability;Increase Physical Activity Increase Physical Activity;Increase Strength and Stamina;Understanding of Exercise Prescription     Comments Lousie is doing well when she comes to rehab. She has transpertation with her daurghter and comes about once a week. She is planning on going to [facility] [employer] with her daughter to exercise as well/, She stated that she does not feel like shes improved much but since she is not coming to rehab regularly that would match up. Lousie is doing well when she comes to rehab. She is dependent on transpertation from her daughter and is unreliable to come twice a week. She is still wanting to go to the [employer] with her daughter. She stated that she has been decorating for Christmas and has been needing to take breaks here and there due to fatigue. Lousie is doing well in rehab. She does not come to class regulary due to traspertation. Will continue to monitor and progress as able,     Expected Outcomes Short: attend rehab regulaly basis long term: [facility]se [facility] Short: attend rehab  regulaly basis long term: [facility]ericse at home or [facility] come to class the two days a week               Discharge Exercise Prescription (Final Exercise Prescription Changes):  Exercise Prescription Changes - 02/26/23 1500       Response to Exercise   Blood Pressure (Admit) 145/55    Blood Pressure (Exit) 114/52    Heart Rate (Admit) 69 bpm    Heart Rate (Exercise) 106 bpm    Heart Rate (Exit) 73 bpm    Rating of Perceived Exertion (Exercise) 12    Duration Continue with 30 min of aerobic exercise without signs/symptoms of physical distress.    Intensity THRR unchanged      Progression   Progression Continue to progress workloads to maintain intensity without signs/symptoms of physical distress.      Resistance Training   Training Prescription Yes    Weight 2 lbs    Reps 10-15      NuStep   Level 2    SPM 63    Minutes 15    METs 1.9      Arm Ergometer   Level 2    RPM 54    Minutes 15    METs 1.9      Oxygen    Maintain Oxygen  Saturation 88% or higher             Nutrition:  Target Goals: Understanding of nutrition guidelines, daily intake of sodium 1500mg , cholesterol 200mg , calories 30% from fat and 7% or less from saturated fats, daily to  have 5 or more servings of fruits and vegetables.  Biometrics:    Nutrition Therapy Plan and Nutrition Goals:   Nutrition Assessments:  MEDIFICTS Score Key: >=70 Need to make dietary changes  40-70 Heart Healthy Diet <= 40 Therapeutic Level Cholesterol Diet  Flowsheet Row CARDIAC REHAB PHASE II ORIENTATION from 11/10/2022 in [facility] CARDIAC REHABILITATION  Picture Your Plate Total Score on Admission 26      Picture Your Plate Scores: <57 Unhealthy dietary pattern with much room for improvement. 41-50 Dietary pattern unlikely to meet recommendations for good health and room for improvement. 51-60 More healthful dietary pattern, with some room for improvement.  >60 Healthy dietary pattern, although there  may be some specific behaviors that could be improved.    Nutrition Goals Re-Evaluation:  Nutrition Goals Re-Evaluation     Row Name 12/25/22 1527 01/27/23 1537           Goals   Nutrition Goal Healthy heathy Healthy heathy      Comment Jahnavi stated that she does eat larger portions and that she loves sweets and ice cream. She is trying to pick healthier options when eating and sticks to chicken, salmon and some ground beef. SHe does like salads. She does not drink soda and only drinks one cup coffee in the mornings. Adelei still contonues to eat larger portion meals but has been trying to cut back. She loves sweets and ice cream. She continues to try to pick healthier options and likes chicken and salmon and does eat some ground beef each week. She continues to only drink water and one cup of coffee during the day.      Expected Outcome Short term: try to cut back on sweets and choice smaller portion control   long term: contiue to pick healthy options for meal Short term: try to cut back on sweets and choice smaller portion control   long term: contiue to pick healthy options for meal               Nutrition Goals Discharge (Final Nutrition Goals Re-Evaluation):  Nutrition Goals Re-Evaluation - 01/27/23 1537       Goals   Nutrition Goal Healthy heathy    Comment Mindie still contonues to eat larger portion meals but has been trying to cut back. She loves sweets and ice cream. She continues to try to pick healthier options and likes chicken and salmon and does eat some ground beef each week. She continues to only drink water and one cup of coffee during the day.    Expected Outcome Short term: try to cut back on sweets and choice smaller portion control   long term: contiue to pick healthy options for meal             Psychosocial: Target Goals: Acknowledge presence or absence of significant depression and/or stress, maximize coping skills, provide positive support system.  Participant is able to verbalize types and ability to use techniques and skills needed for reducing stress and depression.  Initial Review & Psychosocial Screening:   Quality of Life Scores:  Scores of 19 and below usually indicate a poorer quality of life in these areas.  A difference of  2-3 points is a clinically meaningful difference.  A difference of 2-3 points in the total score of the Quality of Life Index has been associated with significant improvement in overall quality of life, self-image, physical symptoms, and general health in studies assessing change in quality of life.  PHQ-9:  Review Flowsheet       02/19/2023 12/25/2022 11/10/2022  Depression screen PHQ 2/9  Decreased Interest 0 2 3  Down, Depressed, Hopeless 1 2 3   PHQ - 2 Score 1 4 6   Altered sleeping - 2 3  Tired, decreased energy - 2 3  Change in appetite - 1 2  Feeling bad or failure about yourself  - 0 0  Trouble concentrating - 0 1  Moving slowly or fidgety/restless - 1 1  Suicidal thoughts - 0 0  PHQ-9 Score - 10 16  Difficult doing work/chores - Somewhat difficult Somewhat difficult   Interpretation of Total Score  Total Score Depression Severity:  1-4 = Minimal depression, 5-9 = Mild depression, 10-14 = Moderate depression, 15-19 = Moderately severe depression, 20-27 = Severe depression   Psychosocial Evaluation and Intervention:   Psychosocial Re-Evaluation:  Psychosocial Re-Evaluation     Row Name 12/25/22 1525 01/27/23 1528           Psychosocial Re-Evaluation   Current issues with History of Depression;Current Depression;Current Sleep Concerns;Current Stress Concerns History of Depression;Current Depression;Current Sleep Concerns;Current Stress Concerns      Comments Lousie is doing okay in rehab when she comes. She is still taking Zoloft  fir depression and anixety. 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 She stays worried about her granddaughter when left home over a year ago and they have not contacted the family. This continues to be her main stressor Lousie is okay with rehab when she comes. 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 She is dependent of her daughter for transpertation and it is making it hard for her to come to rehab regulary. She coninues to worry about her granddaughter who left home over a year ago and has not contacted the family.This continues to be her main stressor in life her family. She goes to her [facility] small group to find friends and joy.      Expected Outcomes Short term: continue to pray about granddaughter to help fing relef in stress.   long term: continue to find joy and be positive Short term: continue to pray about granddaughter to help fing relef in stress.   long term: continue to find joy and be positive      Interventions -- Stress management education;Relaxation education;Encouraged to attend Cardiac Rehabilitation for the exercise      Continue Psychosocial Services  Follow up required by staff Follow up required by staff        Initial Review   Source of Stress Concerns -- Family               Psychosocial Discharge (Final Psychosocial Re-Evaluation):  Psychosocial Re-Evaluation - 01/27/23 1528       Psychosocial Re-Evaluation   Current issues with History of Depression;Current Depression;Current Sleep Concerns;Current Stress Concerns    Comments Lousie is okay with rehab when she comes. 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 She is dependent of her daughter for transpertation and it is making it hard for her to come to rehab regulary. She coninues to worry about her granddaughter who left home over a year ago and has not contacted the family.This continues to be her main stressor in life her family. She goes to her [facility] small group to find friends and joy.    Expected Outcomes Short term: continue to pray about  granddaughter to help fing relef in stress.   long term: continue to find joy and be positive    Interventions Stress management education;Relaxation education;Encouraged to attend Cardiac Rehabilitation for the exercise    Continue Psychosocial Services  Follow up required by staff      Initial Review   Source of Stress Concerns Family             Vocational Rehabilitation: Provide vocational rehab assistance to qualifying candidates.   Vocational Rehab Evaluation & Intervention:   Education: Education Goals: Education classes will be provided on a weekly basis, covering required topics. Participant will state understanding/return demonstration of topics presented.  Learning Barriers/Preferences:   Education Topics: Hypertension, Hypertension Reduction -Define heart disease and high blood pressure. Discus how high blood pressure affects the body and ways to reduce high blood pressure.   Exercise and Your Heart -Discuss why it is important to exercise, the FITT principles of exercise, normal and abnormal responses to exercise, and how to exercise safely.   Angina -Discuss definition of angina, causes of angina, treatment of angina, and how to decrease risk of having angina. Flowsheet Row CARDIAC REHAB PHASE II EXERCISE from 05/07/2023 in [place] CARDIAC REHABILITATION  Date 11/13/22  Educator [facility]       Cardiac Medications -Review what the following cardiac medications are used for, how they affect the body, and side effects that may occur when taking the medications.  Medications include Aspirin , Beta blockers, calcium channel blockers, ACE Inhibitors, angiotensin receptor blockers, diuretics, digoxin, and antihyperlipidemics. Flowsheet Row CARDIAC REHAB PHASE II EXERCISE from 05/07/2023 in [place] CARDIAC REHABILITATION  Date 01/01/23  Educator hb  Instruction Review Code 1- Verbalizes Understanding       Congestive Heart Failure -Discuss the definition of  CHF, how to live with CHF, the signs and symptoms of CHF, and how keep track of weight and sodium intake. Flowsheet Row CARDIAC REHAB PHASE II EXERCISE from 05/07/2023 in [place] CARDIAC REHABILITATION  Date 12/25/22  Educator jh  Instruction Review Code 1- Verbalizes Understanding       Heart Disease and Intimacy -Discus the effect sexual activity has on the heart, how changes occur during intimacy as we age, and safety during sexual activity.   Smoking Cessation / COPD -Discuss different methods to quit smoking, the health benefits of quitting smoking, and the definition of COPD.   Nutrition I: Fats -Discuss the types of cholesterol, what cholesterol does to the heart, and how cholesterol levels can be controlled. Flowsheet Row CARDIAC REHAB PHASE II EXERCISE from 05/07/2023 in [place] CARDIAC REHABILITATION  Date 12/11/22  Educator HB  Instruction Review Code 1- Verbalizes Understanding       Nutrition II: Labels -Discuss the different components of food labels and how to read food label   Heart Parts/Heart Disease and PAD -Discuss the anatomy of the heart, the pathway of blood circulation through the heart, and these are affected by heart disease.   Stress I: Signs and Symptoms -Discuss the causes of stress, how stress may lead to anxiety and depression, and ways to limit stress. Flowsheet Row CARDIAC REHAB PHASE II EXERCISE from 05/07/2023 in [place] CARDIAC REHABILITATION  Date 03/05/23  Educator hb  Instruction Review Code 1- Verbalizes Understanding       Stress II: Relaxation -Discuss different types of relaxation techniques to limit stress. Flowsheet Row CARDIAC REHAB PHASE II EXERCISE from 05/07/2023 in [place] CARDIAC REHABILITATION  Date 04/23/23  Educator [facility]  Instruction  Review Code 1- Verbalizes Understanding       Warning Signs of Stroke / TIA -Discuss definition of a stroke, what the signs and symptoms are of a stroke, and how to  identify when someone is having stroke.   Knowledge Questionnaire Score:   Core Components/Risk Factors/Patient Goals at Admission:   Core Components/Risk Factors/Patient Goals Review:   Goals and Risk Factor Review     Row Name 12/25/22 1530 01/27/23 1540           Core Components/Risk Factors/Patient Goals Review   Personal Goals Review Weight Management/Obesity;Stress Weight Management/Obesity;Stress      Review Lousie does not weight at home abd stated that she has gained some weight. She is trying to pick healthy options to help with weight but eats larger portions. She loves going to her church and being with her small group at [facility]. She stated that being with her small groups help her talk to friends and stress in her life. Lousie does not weight at home but has not gained weight. She is trying to pick healthy options to help with weight but eats larger portions. She loves going to her church and being with her small group at [facility]. She stated that being with her small groups help her talk to friends and stress in her life.      Expected Outcomes Short term: try to do daily weight checks    long termL continue to attend small group for joy and to help with stress Short term: try to do daily weight checks    long termL continue to attend small group for joy and to help with stress               Core Components/Risk Factors/Patient Goals at Discharge (Final Review):   Goals and Risk Factor Review - 01/27/23 1540       Core Components/Risk Factors/Patient Goals Review   Personal Goals Review Weight Management/Obesity;Stress    Review Lousie does not weight at home but has not gained weight. She is trying to pick healthy options to help with weight but eats larger portions. She loves going to her church and being with her small group at [facility]. She stated that being with her small groups help her talk to friends and stress in her life.    Expected Outcomes Short term: try to do daily  weight checks    long termL continue to attend small group for joy and to help with stress             ITP Comments:  ITP Comments     Row Name 12/31/22 0831 01/28/23 1548 02/24/23 1445 03/25/23 1147 04/22/23 1424   ITP Comments 30 day review completed. ITP sent to Dr. Armida Lander, Medical Director of Cardiac Rehab. Continue with ITP unless changes are made by physician. 30 day review completed. ITP sent to Dr. Armida Lander, Medical Director of Cardiac Rehab. Continue with ITP unless changes are made by physician. 30 day review completed. ITP sent to Dr. Armida Lander, Medical Director of Cardiac Rehab. Continue with ITP unless changes are made by physician. Pt has not attended since 01/27/23.  Unable to assess goals this round. 30 day review completed. ITP sent to Dr. Armida Lander, Medical Director of Cardiac Rehab. Continue with ITP unless changes are made by physician. 30 day review completed. ITP sent to Dr. Armida Lander, Medical Director of Cardiac Rehab. Continue with ITP unless changes are made by physician. Has not attended  since 03/26/23.    Row Name 05/20/23 1253 06/17/23 1519         ITP Comments 30 day review completed. ITP sent to Dr. Armida Lander, Medical Director of Cardiac Rehab. Continue with ITP unless changes are made by physician. Attendance continues to be spotty. 30 day review completed. ITP sent to Dr. Armida Lander, Medical Director of Cardiac Rehab. Continue with ITP unless changes are made by physician. Patient has not attended since 05/14/23, unable to assess for goals               Comments: 30 day review

## 2023-06-29 NOTE — Progress Notes (Signed)
 [facility] OFFICE PROGRESS NOTE  Omie Bickers, MD [address] [place] [address]  DIAGNOSIS: highly suspicious stage Ia (T1b, N0, M0) non-small cell lung cancer presented with right upper lobe lung nodule. The biopsy was consistent with atypical cells but not diagnostic for malignancy but her PET scan showed significant hypermetabolic activity in this nodule consistent with lung malignancy.   PRIOR THERAPY: SBRT to the right upper lobe lung nodule under the care of Dr. Jeryl Moris, completed on 03/16/23.   CURRENT THERAPY: Observation   INTERVAL HISTORY: Barbara Lucero 83 y.o. female returns to the clinic today for a follow-up visit.  The patient was last seen by Dr. Marguerita Shih on 02/04/23.  The patient was found to have suspicious stage I non-small cell lung cancer of the right upper lobe.  Her biopsy showed atypical cells which were not diagnostic for malignancy but her PET scan showed significant hypermetabolic activity in this area. Therefore, she underwent SBRT to treat this lesion. She completed this on 03/16/2023.  She tolerated this well she is currently on observation.   She feels generally well with no major changes in her health since her last visit. She experiences fatigue and describes herself as 'tired a lot.' No fevers, infections, night sweats, or unintentional weight loss. She is actively trying to lose weight and is participating in [employer], working out a couple of times a week at [employer]. Her breathing is fine, and she does not experience shortness of breath with exertion. Occasionally, she has a tickle in her throat that causes her to cough, but she denies hemoptysis.   Denies any nausea, vomiting, diarrhea, or constipation.  Denies any headache or visual changes.  She recently had a restaging CT scan.  She is here today for evaluation and to review her scan results.   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 Abel Hoe & Dr. Honey Lusty   Severe aortic stenosis    Varicose veins of bilateral lower extremities with other complications     ALLERGIES:  is allergic to coconut (cocos nucifera) and latex.  MEDICATIONS:  Current Outpatient Medications  Medication Sig Dispense Refill   acetaminophen  (TYLENOL ) 500 MG tablet Take 1,000 mg by mouth 2 (two) times daily.     albuterol (VENTOLIN HFA) 108 (90 Base) MCG/ACT inhaler Inhale 2 puffs into the lungs every 4 (four) hours as needed for shortness of breath or wheezing.     ALPRAZolam  (XANAX ) 0.25 MG tablet Take 0.25 mg by mouth at bedtime.     Ascorbic Acid (VITAMIN C) 1000 MG tablet Take 1,000 mg by mouth daily.     aspirin  EC 81 MG tablet Take 81 mg by mouth at bedtime. Swallow whole.     b complex vitamins tablet Take 1 tablet by mouth daily.     Calcium Carb-Cholecalciferol (CALCIUM 600+D) 600-20 MG-MCG TABS Take 1 tablet by mouth daily.     Cholecalciferol (VITAMIN D3) 50 MCG (2000 UT) capsule Take 2,000 Units  by mouth daily.     cyanocobalamin  (VITAMIN B12) 1000 MCG tablet Take 1,000 mcg by mouth daily as needed (energy).     furosemide (LASIX) 20 MG tablet Take 20 mg by mouth every Wednesday.     gabapentin  (NEURONTIN ) 100 MG capsule Take 1 capsule (100 mg total) by mouth 2 (two) times daily.     hydrocortisone cream 1 % Apply 1 Application topically 2 (two) times daily as needed (rash).     Multiple Vitamins-Minerals (HAIR SKIN & NAILS) TABS Take 3 tablets by mouth daily.     Multiple Vitamins-Minerals (MULTIVITAMIN WITH MINERALS) tablet Take 1 tablet by mouth daily.     MYRBETRIQ  50 MG TB24  tablet TAKE (1) TABLET BY MOUTH AT BEDTIME. 30 tablet 11   olmesartan (BENICAR) 40 MG tablet Take 40 mg by mouth daily.     pantoprazole  (PROTONIX ) 40 MG tablet Take 40 mg by mouth daily.     Psyllium (METAMUCIL PO) Take 1 Dose by mouth daily as needed (constipation). 1 dose = 1 teaspoonful     sertraline  (ZOLOFT ) 100 MG tablet Take 100 mg by mouth at bedtime.     simvastatin  (ZOCOR ) 10 MG tablet Take 10 mg by mouth at bedtime.     solifenacin  (VESICARE ) 10 MG tablet TAKE (1) TABLET BY MOUTH AT BEDTIME. 30 tablet 11   sulfaSALAzine  (AZULFIDINE ) 500 MG tablet Take 1 tablet (500 mg total) by mouth 2 (two) times daily. 60 tablet 2   vitamin E 400 UNIT capsule Take 400 Units by mouth daily.     No current facility-administered medications for this visit.    SURGICAL HISTORY:  Past Surgical History:  Procedure Laterality Date   BOWEL RESECTION  1970   BRONCHIAL BIOPSY  09/01/2022   Procedure: BRONCHIAL BIOPSIES;  Surgeon: Denson Flake, MD;  Location[facility] ENDOSCOPY;  Service: Pulmonary;;   BRONCHIAL BRUSHINGS  09/01/2022   Procedure: BRONCHIAL BRUSHINGS;  Surgeon: Denson Flake, MD;  Location: [facility] ENDOSCOPY;  Service: Pulmonary;;   BRONCHIAL NEEDLE ASPIRATION BIOPSY  09/01/2022   Procedure: BRONCHIAL NEEDLE ASPIRATION BIOPSIES;  Surgeon: Denson Flake, MD;  Location: [facility] ENDOSCOPY;  Service: Pulmonary;;   EYE SURGERY Bilateral    cataract   FIDUCIAL MARKER PLACEMENT  09/01/2022   Procedure: FIDUCIAL MARKER PLACEMENT;  Surgeon: Denson Flake, MD;  Location: [facility] ENDOSCOPY;  Service: Pulmonary;;   HIP ARTHROPLASTY Left 06/06/2016   Procedure: ARTHROPLASTY BIPOLAR HIP (HEMIARTHROPLASTY);  Surgeon: Darrin Emerald, MD;  Location: [facility] ORS;  Service: Orthopedics;  Laterality: Left;   INTRAOPERATIVE TRANSTHORACIC ECHOCARDIOGRAM N/A 10/07/2022   Procedure: INTRAOPERATIVE TRANSTHORACIC ECHOCARDIOGRAM;  Surgeon: Odie Benne, MD;  Location: [facility] INVASIVE CV LAB;  Service: Open Heart Surgery;   Laterality: N/A;   RIGHT HEART CATH AND CORONARY ANGIOGRAPHY N/A 07/07/2022   Procedure: RIGHT HEART CATH AND CORONARY ANGIOGRAPHY;  Surgeon: Odie Benne, MD;  Location: [facility] INVASIVE CV LAB;  Service: Cardiovascular;  Laterality: N/A;   TRANSCATHETER AORTIC VALVE REPLACEMENT, TRANSFEMORAL Right 10/07/2022   Procedure: Transcatheter Aortic Valve Replacement, Transfemoral;  Surgeon: Odie Benne, MD;  Location: [facility] INVASIVE CV LAB;  Service: Open Heart Surgery;  Laterality: Right;    REVIEW OF SYSTEMS:   Review of Systems  Constitutional: Positive for fatigue. Negative for appetite change, chills, fever and unexpected weight change.  HENT: Negative for mouth sores, nosebleeds, sore throat and trouble swallowing.   Eyes: Negative for eye problems and icterus.  Respiratory: Negative for cough, hemoptysis, shortness of breath  and wheezing.   Cardiovascular: Negative for chest pain and leg swelling.  Gastrointestinal: Negative for abdominal pain, constipation, diarrhea, nausea and vomiting.  Genitourinary: Negative for bladder incontinence, difficulty urinating, dysuria, frequency and hematuria.   Musculoskeletal: Negative for back pain, gait problem, neck pain and neck stiffness.  Skin: Negative for itching and rash.  Neurological: Negative for dizziness, extremity weakness, gait problem, headaches, light-headedness and seizures.  Hematological: Negative for adenopathy. Does not bruise/bleed easily.  Psychiatric/Behavioral: Negative for confusion, depression and sleep disturbance. The patient is not nervous/anxious.     PHYSICAL EXAMINATION:  There were no vitals taken for this visit.  ECOG PERFORMANCE STATUS: 1  Physical Exam  Constitutional: Oriented to person, place, and time and well-developed, well-nourished, and in no distress.  HENT:  Head: Normocephalic and atraumatic.  Mouth/Throat: Oropharynx is clear and moist. No oropharyngeal exudate.  Eyes: Conjunctivae  are normal. Right eye exhibits no discharge. Left eye exhibits no discharge. No scleral icterus.  Neck: Normal range of motion. Neck supple.  Cardiovascular: Normal rate, regular rhythm, murmur noted and intact distal pulses.   Pulmonary/Chest: Effort normal and breath sounds normal. No respiratory distress. No wheezes. No rales.  Abdominal: Soft. Bowel sounds are normal. Exhibits no distension and no mass. There is no tenderness.  Musculoskeletal: Normal range of motion. Exhibits no edema.  Lymphadenopathy:    No cervical adenopathy.  Neurological: Alert and oriented to person, place, and time. Exhibits normal muscle tone. She uses an upright walker for ambulation. Skin: Skin is warm and dry. No rash noted. Not diaphoretic. No erythema. No pallor.  Psychiatric: Mood, memory and judgment normal.  Vitals reviewed.  LABORATORY DATA: Lab Results  Component Value Date   WBC 7.7 06/10/2023   HGB 11.8 (L) 06/10/2023   HCT 35.2 (L) 06/10/2023   MCV 86.3 06/10/2023   PLT 143 (L) 06/10/2023      Chemistry      Component Value Date/Time   NA 137 06/10/2023 1026   NA 143 06/23/2022 1201   K 4.6 06/10/2023 1026   CL 100 06/10/2023 1026   CO2 31 06/10/2023 1026   BUN 56 (H) 06/10/2023 1026   BUN 17 06/23/2022 1201   CREATININE 1.02 (H) 06/10/2023 1026      Component Value Date/Time   CALCIUM 10.6 (H) 06/10/2023 1026   ALKPHOS 56 06/10/2023 1026   AST 15 06/10/2023 1026   ALT 10 06/10/2023 1026   BILITOT 0.5 06/10/2023 1026       RADIOGRAPHIC STUDIES:  No results found.   ASSESSMENT/PLAN:  This is a very pleasant 83 year old Caucasian female with highly suspicious stage Ia (T1b, N0, M0) lung cancer.  She presented with right upper lobe lung nodule.  The biopsy was consistent with atypical cells and it was not diagnostic for malignancy but her PET scan was highly suspicious due to significant hypermetabolic activity in this nodule consistent with lung primary.  She completed  SBRT to this nodule under the care of Dr. Jeryl Moris which was completed on 03/16/2023.  She is currently on observation.   The patient was seen with Dr. Marguerita Shih today.  Dr. Marguerita Shih personally and independently reviewed the scan and discussed results with the patient today.  The scan showed no evidence of disease progression. She had some decrease in size in the right upper lobe nodule.    Dr. Marguerita Shih recommend that she continue on observation with a repeat CT scan of the chest in 6 months.  We will see her back for  follow-up visit approximately 1 week after her repeat CT scan.  Dehydration Labs suggest mild dehydration. Discussed hydration importance and strategies. - Encourage increased water intake and consider using a water bottle with time markers to track hydration.  Heart valve prosthesis Heart valve prosthesis with possible slight leak indicated by murmur. - Continue with scheduled cardiology follow-up in August to monitor heart valve status.  Her calcium was elevated. Dr. Marguerita Shih recommended she cut down on her calcium supplement.   The patient was advised to call immediately if she has any concerning symptoms in the interval. The patient voices understanding of current disease status and treatment options and is in agreement with the current care plan. All questions were answered. The patient knows to call the clinic with any problems, questions or concerns. We can certainly see the patient much sooner if necessary   No orders of the defined types were placed in this encounter.    Zakariye Nee L Shylee Durrett, PA-C 06/29/23  ADDENDUM: Hematology/Oncology Attending:  I had a face-to-face encounter with the patient today.  I reviewed her records, lab, scan and recommended her care plan.  This is a very pleasant 83 years old white female with stage Ia non-small cell lung cancer presented with right upper lobe lung nodule with a biopsy consistent with atypical cells but has significant  hypermetabolic activity in this nodule. She was treated with SBRT to the right upper lobe lung nodule under the care of Dr. Jeryl Moris in January 2025 and she is currently on observation.  She is feeling fine with no concerning complaints except for mild shortness of breath and cough.  She had repeat CT scan of the chest performed recently.  I personally and independently reviewed the scan and discussed the result with the patient and her daughter.  Her scan showed decrease in the size of the right upper lobe nodule after the radiotherapy. I recommended for her to continue on observation with repeat CT scan of the chest in 6 months. She was advised to call immediately if she has any other concerning symptoms in the interval. The total time spent in the appointment was 20 minutes including review of chart and various tests results, discussions about plan of care and coordination of care plan . Disclaimer: This note was dictated with voice recognition software. Similar sounding words can inadvertently be transcribed and may be missed upon review. Aurelio Blower, MD

## 2023-07-02 ENCOUNTER — Inpatient Hospital Stay: Attending: Internal Medicine | Admitting: Physician Assistant

## 2023-07-02 VITALS — BP 109/57 | HR 70 | Temp 98.4°F | Resp 17 | Ht 62.0 in | Wt 172.0 lb

## 2023-07-02 DIAGNOSIS — C3411 Malignant neoplasm of upper lobe, right bronchus or lung: Secondary | ICD-10-CM | POA: Diagnosis not present

## 2023-07-02 DIAGNOSIS — R059 Cough, unspecified: Secondary | ICD-10-CM | POA: Diagnosis not present

## 2023-07-02 DIAGNOSIS — Z7982 Long term (current) use of aspirin: Secondary | ICD-10-CM | POA: Diagnosis not present

## 2023-07-02 DIAGNOSIS — Z79899 Other long term (current) drug therapy: Secondary | ICD-10-CM | POA: Diagnosis not present

## 2023-07-02 DIAGNOSIS — R0602 Shortness of breath: Secondary | ICD-10-CM | POA: Insufficient documentation

## 2023-07-02 DIAGNOSIS — K5090 Crohn's disease, unspecified, without complications: Secondary | ICD-10-CM | POA: Diagnosis not present

## 2023-07-02 DIAGNOSIS — Z923 Personal history of irradiation: Secondary | ICD-10-CM | POA: Diagnosis not present

## 2023-07-02 DIAGNOSIS — Z86718 Personal history of other venous thrombosis and embolism: Secondary | ICD-10-CM | POA: Diagnosis not present

## 2023-07-02 DIAGNOSIS — E860 Dehydration: Secondary | ICD-10-CM | POA: Diagnosis not present

## 2023-07-07 ENCOUNTER — Telehealth (HOSPITAL_COMMUNITY): Payer: Self-pay | Admitting: *Deleted

## 2023-07-07 NOTE — Telephone Encounter (Signed)
Called to check on patient.  Left message. ?

## 2023-07-13 NOTE — Progress Notes (Signed)
 [facility]   MULTIDISCIPLINARY HEART VALVE TEAM  Structural Heart Office Note:  .   Date:  07/27/2023  ID:  Barbara Lucero, DOB 02/07/1941, MRN [id number] PCP: Omie Bickers, MD  [facility] Providers Cardiologist:  Janelle Mediate, MD  Dr. Abel Hoe, MD/Dr. Honey Lusty, MD (TAVR)  History of Present Illness: .    Barbara Lucero is a 83 y.o. female with a history of anxiety, depression, Crohn's disease, prior DVT, HTN, HLD and severe aortic stenosis s/p TAVR 10/07/22 and is here today for one month follow up.   [name] had an echocardiogram 04/2022 that showed LVEF 60-65% and severe aortic stenosis with calcified, thickened leaflets, mean gradient 45.7 mmHg, peak gradient 78.5 mmHg, AVA 0.64 cm2, DI 0.20. She was referred to Dr. Leticia Raven 06/23/22 to be considered for TAVR. R/LHC showed nonobstructive CAD. Pre TAVR CTs showed anatomy amendable for a 23 mm Edwards S3UR via the right TF approach. It also showed an enlarging pulmonary nodule highly suspicious for malignancy. She was evaluated by Dr. Baldwin Levee and underwent navigational bronchoscopy on 09/01/22 with results suspicious for slow growing malignancy. 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 Not felt to be a good resection candidate therefore plan for radiation oncology referral per Dr. Marguerita Shih for consideration of SBRT after TAVR completed.   She underwent successful TAVR with a 23 mm Edwards Sapien 3 THV via the TF approach on 10/07/22. Post operative echo with normal LV function, mean gradient at , peak , AVA by VTI at 1.59cm2, and DI of 0.56.  She was restarted on ASA 81mg  monotherapy. She had some difficulty with hypoxia and was discharged on supplemental oxygen . No longer needed now   Gradients up on TTE done 11/10/22 CTA 11/19/22 with no significant HALT/HAM and well expanded valve so no anticoagulation   TTE 02/27/23 EF normal mean gradient 19 peak 38.7 mmHg AVA 1.6 cm2 DVI  0.55   Will have f/u CT for her lungs in 6 months .       Physical Exam:    VS:  BP 100/70 (BP Location: Left Arm, Patient Position: Sitting, Cuff Size: Normal)   Pulse 74   Ht 5\' 2"  (1.575 m)   Wt 169 lb (76.7 kg)   SpO2 98%   BMI 30.91 kg/m    Wt Readings from Last 3 Encounters:  07/27/23 169 lb (76.7 kg)  07/16/23 172 lb (78 kg)  07/02/23 172 lb (78 kg)    Affect appropriate Healthy:  appears stated age HEENT: normal Neck supple with no adenopathy JVP normal no bruits no thyromegaly Lungs clear with no wheezing and good diaphragmatic motion Heart:  S1/S2 SEM through TAVR valve no AR, no rub, gallop or click PMI normal Abdomen: benighn, BS positve, no tenderness, no AAA no bruit.  No HSM or HJR Distal pulses intact with no bruits No edema Neuro non-focal Skin warm and dry No muscular weakness     ASSESSMENT AND PLAN: .    Severe AS: Patient doing well with NYHA class I symptoms s/p successful TAVR with a 23 mm Edwards Sapien 3 10/07/22. Echo 11/10/22  with hyperdynamic EF with increased gradient on echo 11/10/22  at when compared to post op echo ( ), peak 48.63mmHg, and AVA by VTI at 1.35cm2 with moderate AR. Restarted on home ASA 81mg  daily. CTA 11/19/22 showed trivial amount of HALT and well placed/expanded TAVR valve and no indication to start anticoagulation  TTE  02/27/23 mean gradient 19 peak 38.7 mmhg slightly lower  SBE prophylaxis    Hypoxia: Improved today and no longer requiring supplemental O2.    HTN: WNL today with no changes needed at this time.   HLD: Continue statin    Previous DVT: No longer on OAC.    Pulmonary nodule: Pre TAVR CT imaging with suspicious lung nodule. Underwent navigational bronch 09/01/22 with results suspicious for slow growing malignancy. 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  XRT Rx completed 03/16/23 Plan for CT in 6 months  Signed, Janelle Mediate, MD

## 2023-07-15 ENCOUNTER — Encounter (HOSPITAL_COMMUNITY): Payer: Self-pay | Admitting: *Deleted

## 2023-07-15 DIAGNOSIS — Z952 Presence of prosthetic heart valve: Secondary | ICD-10-CM

## 2023-07-15 NOTE — Progress Notes (Signed)
 Cardiac Individual Treatment Plan  Patient Details  Name: Barbara Lucero MRN: [id number] Date of Birth: 05-11-1940 Referring Provider:   Flowsheet Row CARDIAC REHAB PHASE II ORIENTATION from 11/10/2022 in [facility] CARDIAC REHABILITATION  Referring Provider Dr. Abel Hoe       Initial Encounter Date:  Flowsheet Row CARDIAC REHAB PHASE II ORIENTATION from 11/10/2022 in [place] CARDIAC REHABILITATION  Date 11/10/22       Visit Diagnosis: S/P TAVR (transcatheter aortic valve replacement)  Patient's Home Medications on Admission:  Current Outpatient Medications:    acetaminophen  (TYLENOL ) 500 MG tablet, Take 1,000 mg by mouth 2 (two) times daily., Disp: , Rfl:    albuterol (VENTOLIN HFA) 108 (90 Base) MCG/ACT inhaler, Inhale 2 puffs into the lungs every 4 (four) hours as needed for shortness of breath or wheezing., Disp: , Rfl:    ALPRAZolam  (XANAX ) 0.25 MG tablet, Take 0.25 mg by mouth at bedtime., Disp: , Rfl:    Ascorbic Acid (VITAMIN C) 1000 MG tablet, Take 1,000 mg by mouth daily., Disp: , Rfl:    aspirin  EC 81 MG tablet, Take 81 mg by mouth at bedtime. Swallow whole., Disp: , Rfl:    b complex vitamins tablet, Take 1 tablet by mouth daily., Disp: , Rfl:    Calcium Carb-Cholecalciferol (CALCIUM 600+D) 600-20 MG-MCG TABS, Take 1 tablet by mouth daily., Disp: , Rfl:    Cholecalciferol (VITAMIN D3) 50 MCG (2000 UT) capsule, Take 2,000 Units by mouth daily., Disp: , Rfl:    cyanocobalamin  (VITAMIN B12) 1000 MCG tablet, Take 1,000 mcg by mouth daily as needed (energy)., Disp: , Rfl:    furosemide (LASIX) 20 MG tablet, Take 20 mg by mouth every Wednesday., Disp: , Rfl:    gabapentin  (NEURONTIN ) 100 MG capsule, Take 1 capsule (100 mg total) by mouth 2 (two) times daily., Disp: , Rfl:    hydrocortisone cream 1 %, Apply 1 Application topically 2 (two) times daily as needed (rash)., Disp: , Rfl:    Multiple Vitamins-Minerals (HAIR SKIN & NAILS) TABS, Take 3 tablets by mouth daily., Disp: ,  Rfl:    Multiple Vitamins-Minerals (MULTIVITAMIN WITH MINERALS) tablet, Take 1 tablet by mouth daily., Disp: , Rfl:    MYRBETRIQ  50 MG TB24 tablet, TAKE (1) TABLET BY MOUTH AT BEDTIME., Disp: 30 tablet, Rfl: 11   olmesartan (BENICAR) 40 MG tablet, Take 40 mg by mouth daily., Disp: , Rfl:    pantoprazole  (PROTONIX ) 40 MG tablet, Take 40 mg by mouth daily., Disp: , Rfl:    Psyllium (METAMUCIL PO), Take 1 Dose by mouth daily as needed (constipation). 1 dose = 1 teaspoonful, Disp: , Rfl:    sertraline  (ZOLOFT ) 100 MG tablet, Take 100 mg by mouth at bedtime., Disp: , Rfl:    simvastatin  (ZOCOR ) 10 MG tablet, Take 10 mg by mouth at bedtime., Disp: , Rfl:    solifenacin  (VESICARE ) 10 MG tablet, TAKE (1) TABLET BY MOUTH AT BEDTIME., Disp: 30 tablet, Rfl: 11   sulfaSALAzine  (AZULFIDINE ) 500 MG tablet, Take 1 tablet (500 mg total) by mouth 2 (two) times daily., Disp: 60 tablet, Rfl: 2   vitamin E 400 UNIT capsule, Take 400 Units by mouth daily., Disp: , Rfl:   Past Medical History: Past Medical History:  Diagnosis Date   Anxiety    Arthritis    Cancer (HCC)    face- removed in office   Crohn disease (HCC)    Depression    DVT (deep venous thrombosis) (HCC) 07/27/2017   LLE  GERD (gastroesophageal reflux disease)    HTN (hypertension)    Hyperlipidemia    Neuropathy    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 Abel Hoe & Dr. Honey Lusty   Severe aortic stenosis    Varicose veins of bilateral lower extremities with other complications     Tobacco Use: Social History   Tobacco Use  Smoking Status Former   Current packs/day: 0.50   Average packs/day: 0.5 packs/day for 20.0 years (10.0 ttl pk-yrs)   Types: Cigarettes  Smokeless Tobacco Never    Labs: Review Flowsheet       Latest Ref Rng & Units 11/22/2021 07/07/2022 10/07/2022  Labs for ITP Cardiac and Pulmonary Rehab  PH, Arterial 7.35 - 7.45 - 7.300  -   PCO2 arterial 32 - 48 mmHg - 54.1  -  Bicarbonate 20.0 - 28.0 mmol/L - 26.6  29.0  -  TCO2 22 - 32 mmol/L 30  28  31  26    O2 Saturation % - 94  74  -    Details       Multiple values from one day are sorted in reverse-chronological order         Capillary Blood Glucose: Lab Results  Component Value Date   GLUCAP 87 09/26/2022   GLUCAP 112 (H) 11/22/2021   GLUCAP 124 (H) 06/06/2016   GLUCAP 125 (H) 06/06/2016     Exercise Target Goals: Exercise Program Goal: Individual exercise prescription set using results from initial 6 min walk test and THRR while considering  patient's activity barriers and safety.   Exercise Prescription Goal: Starting with aerobic activity 30 plus minutes a day, 3 days per week for initial exercise prescription. Provide home exercise prescription and guidelines that participant acknowledges understanding prior to discharge.  Activity Barriers & Risk Stratification:   6 Minute Walk:   Oxygen  Initial Assessment:   Oxygen  Re-Evaluation:   Oxygen  Discharge (Final Oxygen  Re-Evaluation):   Initial Exercise Prescription:   Perform Capillary Blood Glucose checks as needed.  Exercise Prescription Changes:   Exercise Prescription Changes     Row Name 02/26/23 1500             Response to Exercise   Blood Pressure (Admit) 145/55       Blood Pressure (Exit) 114/52       Heart Rate (Admit) 69 bpm       Heart Rate (Exercise) 106 bpm       Heart Rate (Exit) 73 bpm       Rating of Perceived Exertion (Exercise) 12       Duration Continue with 30 min of aerobic exercise without signs/symptoms of physical distress.       Intensity THRR unchanged         Progression   Progression Continue to progress workloads to maintain intensity without signs/symptoms of physical distress.         Resistance Training   Training Prescription Yes       Weight 2 lbs       Reps 10-15         NuStep   Level 2       SPM 63       Minutes 15        METs 1.9         Arm Ergometer   Level 2       RPM 54  Minutes 15       METs 1.9         Oxygen    Maintain Oxygen  Saturation 88% or higher                Exercise Comments:   Exercise Goals and Review:   Exercise Goals Re-Evaluation :  Exercise Goals Re-Evaluation     Row Name 01/27/23 1520 03/02/23 0821           Exercise Goal Re-Evaluation   Exercise Goals Review Increase Strength and Stamina;Improve claudication pain tolerance and improve walking ability;Increase Physical Activity Increase Physical Activity;Increase Strength and Stamina;Understanding of Exercise Prescription      Comments Lousie is doing well when she comes to rehab. She is dependent on transpertation from her daughter and is unreliable to come twice a week. She is still wanting to [facility] [employer] with her [facility]. She stated that she has been decorating for Christmas and has been needing to take breaks here and there due to fatigue. Lousie is doing well in rehab. She does not come to class regulary due to traspertation. Will continue to monitor and progress as able,      Expected Outcomes Short: attend rehab regulaly basis long term: exericse at home or [facility] come to class the two days a week                Discharge Exercise Prescription (Final Exercise Prescription Changes):  Exercise Prescription Changes - 02/26/23 1500       Response to Exercise   Blood Pressure (Admit) 145/55    Blood Pressure (Exit) 114/52    Heart Rate (Admit) 69 bpm    Heart Rate (Exercise) 106 bpm    Heart Rate (Exit) 73 bpm    Rating of Perceived Exertion (Exercise) 12    Duration Continue with 30 min of aerobic exercise without signs/symptoms of physical distress.    Intensity THRR unchanged      Progression   Progression Continue to progress workloads to maintain intensity without signs/symptoms of physical distress.      Resistance Training   Training Prescription Yes    Weight 2 lbs    Reps 10-15       NuStep   Level 2    SPM 63    Minutes 15    METs 1.9      Arm Ergometer   Level 2    RPM 54    Minutes 15    METs 1.9      Oxygen    Maintain Oxygen  Saturation 88% or higher             Nutrition:  Target Goals: Understanding of nutrition guidelines, daily intake of sodium 1500mg , cholesterol 200mg , calories 30% from fat and 7% or less from saturated fats, daily to have 5 or more servings of fruits and vegetables.  Biometrics:    Nutrition Therapy Plan and Nutrition Goals:   Nutrition Assessments:  MEDIFICTS Score Key: >=70 Need to make dietary changes  40-70 Heart Healthy Diet <= 40 Therapeutic Level Cholesterol Diet  Flowsheet Row CARDIAC REHAB PHASE II ORIENTATION from 11/10/2022 in [facility] CARDIAC REHABILITATION  Picture Your Plate Total Score on Admission 26      Picture Your Plate Scores: <16 Unhealthy dietary pattern with much room for improvement. 41-50 Dietary pattern unlikely to meet recommendations for good health and room for improvement. 51-60 More healthful dietary pattern, with some room for improvement.  >60 Healthy dietary pattern, although there  may be some specific behaviors that could be improved.    Nutrition Goals Re-Evaluation:  Nutrition Goals Re-Evaluation     Row Name 01/27/23 1537             Goals   Nutrition Goal Healthy heathy       Comment Anarie still contonues to eat larger portion meals but has been trying to cut back. She loves sweets and ice cream. She continues to try to pick healthier options and likes chicken and salmon and does eat some ground beef each week. She continues to only drink water and one cup of coffee during the day.       Expected Outcome Short term: try to cut back on sweets and choice smaller portion control   long term: contiue to pick healthy options for meal                Nutrition Goals Discharge (Final Nutrition Goals Re-Evaluation):  Nutrition Goals Re-Evaluation - 01/27/23  1537       Goals   Nutrition Goal Healthy heathy    Comment Edwina still contonues to eat larger portion meals but has been trying to cut back. She loves sweets and ice cream. She continues to try to pick healthier options and likes chicken and salmon and does eat some ground beef each week. She continues to only drink water and one cup of coffee during the day.    Expected Outcome Short term: try to cut back on sweets and choice smaller portion control   long term: contiue to pick healthy options for meal             Psychosocial: Target Goals: Acknowledge presence or absence of significant depression and/or stress, maximize coping skills, provide positive support system. Participant is able to verbalize types and ability to use techniques and skills needed for reducing stress and depression.  Initial Review & Psychosocial Screening:   Quality of Life Scores:  Scores of 19 and below usually indicate a poorer quality of life in these areas.  A difference of  2-3 points is a clinically meaningful difference.  A difference of 2-3 points in the total score of the Quality of Life Index has been associated with significant improvement in overall quality of life, self-image, physical symptoms, and general health in studies assessing change in quality of life.  PHQ-9: Review Flowsheet       02/19/2023 12/25/2022 11/10/2022  Depression screen PHQ 2/9  Decreased Interest 0 2 3  Down, Depressed, Hopeless 1 2 3   PHQ - 2 Score 1 4 6   Altered sleeping - 2 3  Tired, decreased energy - 2 3  Change in appetite - 1 2  Feeling bad or failure about yourself  - 0 0  Trouble concentrating - 0 1  Moving slowly or fidgety/restless - 1 1  Suicidal thoughts - 0 0  PHQ-9 Score - 10 16  Difficult doing work/chores - Somewhat difficult Somewhat difficult   Interpretation of Total Score  Total Score Depression Severity:  1-4 = Minimal depression, 5-9 = Mild depression, 10-14 = Moderate depression,  15-19 = Moderately severe depression, 20-27 = Severe depression   Psychosocial Evaluation and Intervention:   Psychosocial Re-Evaluation:  Psychosocial Re-Evaluation     Row Name 01/27/23 1528             Psychosocial Re-Evaluation   Current issues with History of Depression;Current Depression;Current Sleep Concerns;Current Stress Concerns       Comments Lousie is  okay with rehab when she comes. 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 She is dependent of her daughter for transpertation and it is making it hard for her to come to rehab regulary. She coninues to worry about her granddaughter who left home over a year ago and has not contacted the family.This continues to be her main stressor in life her family. She goes to her [facility] small group to find friends and joy.       Expected Outcomes Short term: continue to pray about granddaughter to help fing relef in stress.   long term: continue to find joy and be positive       Interventions Stress management education;Relaxation education;Encouraged to attend Cardiac Rehabilitation for the exercise       Continue Psychosocial Services  Follow up required by staff         Initial Review   Source of Stress Concerns Family                Psychosocial Discharge (Final Psychosocial Re-Evaluation):  Psychosocial Re-Evaluation - 01/27/23 1528       Psychosocial Re-Evaluation   Current issues with History of Depression;Current Depression;Current Sleep Concerns;Current Stress Concerns    Comments Lousie is okay with rehab when she comes. 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 She is dependent of her daughter for transpertation and it is making it hard for her to come to rehab regulary. She coninues to worry about her granddaughter who left home over a year ago and has not contacted the family.This continues to be her main stressor in life her family. She goes to her OBC small group to find friends and joy.     Expected Outcomes Short term: continue to pray about granddaughter to help fing relef in stress.   long term: continue to find joy and be positive    Interventions Stress management education;Relaxation education;Encouraged to attend Cardiac Rehabilitation for the exercise    Continue Psychosocial Services  Follow up required by staff      Initial Review   Source of Stress Concerns Family             Vocational Rehabilitation: Provide vocational rehab assistance to qualifying candidates.   Vocational Rehab Evaluation & Intervention:   Education: Education Goals: Education classes will be provided on a weekly basis, covering required topics. Participant will state understanding/return demonstration of topics presented.  Learning Barriers/Preferences:   Education Topics: Hypertension, Hypertension Reduction -Define heart disease and high blood pressure. Discus how high blood pressure affects the body and ways to reduce high blood pressure.   Exercise and Your Heart -Discuss why it is important to exercise, the FITT principles of exercise, normal and abnormal responses to exercise, and how to exercise safely.   Angina -Discuss definition of angina, causes of angina, treatment of angina, and how to decrease risk of having angina. Flowsheet Row CARDIAC REHAB PHASE II EXERCISE from 05/07/2023 in [place] CARDIAC REHABILITATION  Date 11/13/22  Educator [facility]       Cardiac Medications -Review what the following cardiac medications are used for, how they affect the body, and side effects that may occur when taking the medications.  Medications include Aspirin , Beta blockers, calcium channel blockers, ACE Inhibitors, angiotensin receptor blockers, diuretics, digoxin, and antihyperlipidemics. Flowsheet Row CARDIAC REHAB PHASE II EXERCISE from 05/07/2023 in [place] CARDIAC REHABILITATION  Date 01/01/23  Educator hb  Instruction Review Code 1- Verbalizes Understanding  Congestive Heart Failure -Discuss the definition of CHF, how to live with CHF, the signs and symptoms of CHF, and how keep track of weight and sodium intake. Flowsheet Row CARDIAC REHAB PHASE II EXERCISE from 05/07/2023 in [place] CARDIAC REHABILITATION  Date 12/25/22  Educator jh  Instruction Review Code 1- Verbalizes Understanding       Heart Disease and Intimacy -Discus the effect sexual activity has on the heart, how changes occur during intimacy as we age, and safety during sexual activity.   Smoking Cessation / COPD -Discuss different methods to quit smoking, the health benefits of quitting smoking, and the definition of COPD.   Nutrition I: Fats -Discuss the types of cholesterol, what cholesterol does to the heart, and how cholesterol levels can be controlled. Flowsheet Row CARDIAC REHAB PHASE II EXERCISE from 05/07/2023 in [place] CARDIAC REHABILITATION  Date 12/11/22  Educator HB  Instruction Review Code 1- Verbalizes Understanding       Nutrition II: Labels -Discuss the different components of food labels and how to read food label   Heart Parts/Heart Disease and PAD -Discuss the anatomy of the heart, the pathway of blood circulation through the heart, and these are affected by heart disease.   Stress I: Signs and Symptoms -Discuss the causes of stress, how stress may lead to anxiety and depression, and ways to limit stress. Flowsheet Row CARDIAC REHAB PHASE II EXERCISE from 05/07/2023 in [place] CARDIAC REHABILITATION  Date 03/05/23  Educator hb  Instruction Review Code 1- Verbalizes Understanding       Stress II: Relaxation -Discuss different types of relaxation techniques to limit stress. Flowsheet Row CARDIAC REHAB PHASE II EXERCISE from 05/07/2023 in [place] CARDIAC REHABILITATION  Date 04/23/23  Educator [facility]  Instruction Review Code 1- Verbalizes Understanding       Warning Signs of Stroke / TIA -Discuss definition of a stroke,  what the signs and symptoms are of a stroke, and how to identify when someone is having stroke.   Knowledge Questionnaire Score:   Core Components/Risk Factors/Patient Goals at Admission:   Core Components/Risk Factors/Patient Goals Review:   Goals and Risk Factor Review     Row Name 01/27/23 1540             Core Components/Risk Factors/Patient Goals Review   Personal Goals Review Weight Management/Obesity;Stress       Review Lousie does not weight at home but has not gained weight. She is trying to pick healthy options to help with weight but eats larger portions. She loves going to her church and being with her small group at [facility]. She stated that being with her small groups help her talk to friends and stress in her life.       Expected Outcomes Short term: try to do daily weight checks    long termL continue to attend small group for joy and to help with stress                Core Components/Risk Factors/Patient Goals at Discharge (Final Review):   Goals and Risk Factor Review - 01/27/23 1540       Core Components/Risk Factors/Patient Goals Review   Personal Goals Review Weight Management/Obesity;Stress    Review Lousie does not weight at home but has not gained weight. She is trying to pick healthy options to help with weight but eats larger portions. She loves going to her church and being with her small group at [facility]. She stated that being with  her small groups help her talk to friends and stress in her life.    Expected Outcomes Short term: try to do daily weight checks    long termL continue to attend small group for joy and to help with stress             ITP Comments:  ITP Comments     Row Name 01/28/23 1548 02/24/23 1445 03/25/23 1147 04/22/23 1424 05/20/23 1253   ITP Comments 30 day review completed. ITP sent to Dr. Armida Lander, Medical Director of Cardiac Rehab. Continue with ITP unless changes are made by physician. 30 day review completed. ITP sent  to Dr. Armida Lander, Medical Director of Cardiac Rehab. Continue with ITP unless changes are made by physician. Pt has not attended since 01/27/23.  Unable to assess goals this round. 30 day review completed. ITP sent to Dr. Armida Lander, Medical Director of Cardiac Rehab. Continue with ITP unless changes are made by physician. 30 day review completed. ITP sent to Dr. Armida Lander, Medical Director of Cardiac Rehab. Continue with ITP unless changes are made by physician. Has not attended since 03/26/23. 30 day review completed. ITP sent to Dr. Armida Lander, Medical Director of Cardiac Rehab. Continue with ITP unless changes are made by physician. Attendance continues to be spotty.    Row Name 06/17/23 1519 07/15/23 1156         ITP Comments 30 day review completed. ITP sent to Dr. Armida Lander, Medical Director of Cardiac Rehab. Continue with ITP unless changes are made by physician. Patient has not attended since 05/14/23, unable to assess for goals 30 day review completed. ITP sent to Dr. Armida Lander, Medical Director of Cardiac Rehab. Continue with ITP unless changes are made by physician. Patient has not attended since 05/14/23, unable to assess for goals.  Sent letter end of 36 weeks is 07/24/23.               Comments: 30 day review

## 2023-07-16 ENCOUNTER — Encounter: Payer: Self-pay | Admitting: Obstetrics & Gynecology

## 2023-07-16 ENCOUNTER — Ambulatory Visit: Admitting: Obstetrics & Gynecology

## 2023-07-16 VITALS — BP 165/72 | HR 68 | Ht 62.0 in | Wt 172.0 lb

## 2023-07-16 DIAGNOSIS — Z4689 Encounter for fitting and adjustment of other specified devices: Secondary | ICD-10-CM

## 2023-07-16 DIAGNOSIS — N813 Complete uterovaginal prolapse: Secondary | ICD-10-CM

## 2023-07-16 DIAGNOSIS — N3281 Overactive bladder: Secondary | ICD-10-CM | POA: Diagnosis not present

## 2023-07-16 NOTE — Progress Notes (Signed)
 Chief Complaint  Patient presents with   Pessary Check    Blood pressure (!) 165/72, pulse 68, height 5\' 2"  (1.575 m), weight 172 lb (78 kg).  Barbara Lucero presents today for routine follow up related to her pessary.   She uses a Milex ring with support #5 She reports no vaginal discharge and no vaginal bleeding   Likert scale(1 not bothersome -5 very bothersome)  :  1  Exam reveals no undue vaginal mucosal pressure of breakdown, no discharge and no vaginal bleeding.  Vaginal Epithelial Abnormality Classification System:   0 0    No abnormalities 1    Epithelial erythema 2    Granulation tissue 3    Epithelial break or erosion, 1 cm or less 4    Epithelial break or erosion, 1 cm or greater  The pessary is removed, cleaned and replaced without difficulty.      ICD-10-CM   1. 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 OAB (overactive bladder): chronic + suboptimally managed  N32.81        EZELLE SURPRENANT will be sen back in 4 months for continued follow up. Pure wick Rx given for night time incontinence  Wendelyn Halter, MD  07/16/2023 4:04 PM

## 2023-07-17 DIAGNOSIS — E559 Vitamin D deficiency, unspecified: Secondary | ICD-10-CM | POA: Diagnosis not present

## 2023-07-17 DIAGNOSIS — E785 Hyperlipidemia, unspecified: Secondary | ICD-10-CM | POA: Diagnosis not present

## 2023-07-17 DIAGNOSIS — R7303 Prediabetes: Secondary | ICD-10-CM | POA: Diagnosis not present

## 2023-07-23 DIAGNOSIS — I129 Hypertensive chronic kidney disease with stage 1 through stage 4 chronic kidney disease, or unspecified chronic kidney disease: Secondary | ICD-10-CM | POA: Diagnosis not present

## 2023-07-23 DIAGNOSIS — R809 Proteinuria, unspecified: Secondary | ICD-10-CM | POA: Diagnosis not present

## 2023-07-23 DIAGNOSIS — M25562 Pain in left knee: Secondary | ICD-10-CM | POA: Diagnosis not present

## 2023-07-23 DIAGNOSIS — I10 Essential (primary) hypertension: Secondary | ICD-10-CM | POA: Diagnosis not present

## 2023-07-23 DIAGNOSIS — E785 Hyperlipidemia, unspecified: Secondary | ICD-10-CM | POA: Diagnosis not present

## 2023-07-23 DIAGNOSIS — R7303 Prediabetes: Secondary | ICD-10-CM | POA: Diagnosis not present

## 2023-07-23 DIAGNOSIS — M25561 Pain in right knee: Secondary | ICD-10-CM | POA: Diagnosis not present

## 2023-07-23 DIAGNOSIS — N1831 Chronic kidney disease, stage 3a: Secondary | ICD-10-CM | POA: Diagnosis not present

## 2023-07-23 DIAGNOSIS — I700 Atherosclerosis of aorta: Secondary | ICD-10-CM | POA: Diagnosis not present

## 2023-07-23 DIAGNOSIS — I350 Nonrheumatic aortic (valve) stenosis: Secondary | ICD-10-CM | POA: Diagnosis not present

## 2023-07-23 DIAGNOSIS — I872 Venous insufficiency (chronic) (peripheral): Secondary | ICD-10-CM | POA: Diagnosis not present

## 2023-07-23 DIAGNOSIS — G8929 Other chronic pain: Secondary | ICD-10-CM | POA: Diagnosis not present

## 2023-07-24 NOTE — Addendum Note (Signed)
 Encounter addended by: [place], RN on: 07/24/2023 9:57 AM  Actions taken: Episode resolved

## 2023-07-24 NOTE — Progress Notes (Signed)
 Discharge Progress Report  Patient Details  Name: Barbara Lucero MRN: [id number] Date of Birth: December 23, 1940 Referring Provider:   Flowsheet Row CARDIAC REHAB PHASE II ORIENTATION from 11/10/2022 in [facility] CARDIAC REHABILITATION  Referring Provider Dr. Abel Hoe        Number of Visits: 26  Reason for Discharge:  Early Exit:  Lack of attendance  Smoking History:  Social History   Tobacco Use  Smoking Status Former   Current packs/day: 0.50   Average packs/day: 0.5 packs/day for 20.0 years (10.0 ttl pk-yrs)   Types: Cigarettes  Smokeless Tobacco Never    Diagnosis:  S/P TAVR (transcatheter aortic valve replacement)  ADL UCSD:   Initial Exercise Prescription:   Discharge Exercise Prescription (Final Exercise Prescription Changes):  Exercise Prescription Changes - 02/26/23 1500       Response to Exercise   Blood Pressure (Admit) 145/55    Blood Pressure (Exit) 114/52    Heart Rate (Admit) 69 bpm    Heart Rate (Exercise) 106 bpm    Heart Rate (Exit) 73 bpm    Rating of Perceived Exertion (Exercise) 12    Duration Continue with 30 min of aerobic exercise without signs/symptoms of physical distress.    Intensity THRR unchanged      Progression   Progression Continue to progress workloads to maintain intensity without signs/symptoms of physical distress.      Resistance Training   Training Prescription Yes    Weight 2 lbs    Reps 10-15      NuStep   Level 2    SPM 63    Minutes 15    METs 1.9      Arm Ergometer   Level 2    RPM 54    Minutes 15    METs 1.9      Oxygen    Maintain Oxygen  Saturation 88% or higher             Functional Capacity:   Psychological, QOL, Others - Outcomes: PHQ 2/9:    02/19/2023    1:02 PM 12/25/2022    3:44 PM 11/10/2022    3:47 PM  Depression screen PHQ 2/9  Decreased Interest 0 2 3  Down, Depressed, Hopeless 1 2 3   PHQ - 2 Score 1 4 6   Altered sleeping  2 3  Tired, decreased energy  2 3  Change in  appetite  1 2  Feeling bad or failure about yourself   0 0  Trouble concentrating  0 1  Moving slowly or fidgety/restless  1 1  Suicidal thoughts  0 0  PHQ-9 Score  10 16  Difficult doing work/chores  Somewhat difficult Somewhat difficult    Quality of Life:   Personal Goals: Goals established at orientation with interventions provided to work toward goal.    Personal Goals Discharge:  Goals and Risk Factor Review     Row Name 01/27/23 1540             Core Components/Risk Factors/Patient Goals Review   Personal Goals Review Weight Management/Obesity;Stress       Review Barbara Lucero does not weight at home but has not gained weight. She is trying to pick healthy options to help with weight but eats larger portions. She loves going to her church and being with her small group at [facility]. She stated that being with her small groups help her talk to friends and stress in her life.       Expected Outcomes Short term: try  to do daily weight checks    long termL continue to attend small group for [employer] and to help with stress                Exercise Goals and Review:   Exercise Goals Re-Evaluation:  Exercise Goals Re-Evaluation     Row Name 01/27/23 1520 03/02/23 0821           Exercise Goal Re-Evaluation   Exercise Goals Review Increase Strength and Stamina;Improve claudication pain tolerance and improve walking ability;Increase Physical Activity Increase Physical Activity;Increase Strength and Stamina;Understanding of Exercise Prescription      Comments Barbara Lucero is doing well when she comes to rehab. She is dependent on transpertation from her daughter and is unreliable to come twice a week. She is still wanting to go to the [employer] with her [facility]. She stated that she has been decorating for Christmas and has been needing to take breaks here and there due to fatigue. Barbara Lucero is doing well in rehab. She does not come to class regulary due to traspertation. Will continue to monitor and  progress as able,      Expected Outcomes Short: attend rehab regulaly basis long term: exericse at home or [employer] come to class the two days a week               Nutrition & Weight - Outcomes:    Nutrition:   Nutrition Discharge:   Education Questionnaire Score:   Early discharge due to lack of attendance.

## 2023-07-24 NOTE — Progress Notes (Signed)
 Cardiac Individual Treatment Plan  Patient Details  Name: Barbara Lucero MRN: [id number] Date of Birth: 1940-11-13 Referring Provider:   Flowsheet Row CARDIAC REHAB PHASE II ORIENTATION from 11/10/2022 in [facility] CARDIAC REHABILITATION  Referring Provider Dr. Abel Hoe       Initial Encounter Date:  Flowsheet Row CARDIAC REHAB PHASE II ORIENTATION from 11/10/2022 in [place] CARDIAC REHABILITATION  Date 11/10/22       Visit Diagnosis: S/P TAVR (transcatheter aortic valve replacement)  Patient's Home Medications on Admission:  Current Outpatient Medications:    acetaminophen  (TYLENOL ) 500 MG tablet, Take 1,000 mg by mouth 2 (two) times daily., Disp: , Rfl:    albuterol (VENTOLIN HFA) 108 (90 Base) MCG/ACT inhaler, Inhale 2 puffs into the lungs every 4 (four) hours as needed for shortness of breath or wheezing., Disp: , Rfl:    ALPRAZolam  (XANAX ) 0.25 MG tablet, Take 0.25 mg by mouth at bedtime., Disp: , Rfl:    Ascorbic Acid (VITAMIN C) 1000 MG tablet, Take 1,000 mg by mouth daily., Disp: , Rfl:    aspirin  EC 81 MG tablet, Take 81 mg by mouth at bedtime. Swallow whole., Disp: , Rfl:    b complex vitamins tablet, Take 1 tablet by mouth daily., Disp: , Rfl:    Calcium Carb-Cholecalciferol (CALCIUM 600+D) 600-20 MG-MCG TABS, Take 1 tablet by mouth daily., Disp: , Rfl:    Cholecalciferol (VITAMIN D3) 50 MCG (2000 UT) capsule, Take 2,000 Units by mouth daily., Disp: , Rfl:    cyanocobalamin  (VITAMIN B12) 1000 MCG tablet, Take 1,000 mcg by mouth daily as needed (energy)., Disp: , Rfl:    furosemide (LASIX) 20 MG tablet, Take 20 mg by mouth every Wednesday., Disp: , Rfl:    gabapentin  (NEURONTIN ) 100 MG capsule, Take 1 capsule (100 mg total) by mouth 2 (two) times daily., Disp: , Rfl:    hydrocortisone cream 1 %, Apply 1 Application topically 2 (two) times daily as needed (rash)., Disp: , Rfl:    Multiple Vitamins-Minerals (HAIR SKIN & NAILS) TABS, Take 3 tablets by mouth daily., Disp: ,  Rfl:    Multiple Vitamins-Minerals (MULTIVITAMIN WITH MINERALS) tablet, Take 1 tablet by mouth daily., Disp: , Rfl:    MYRBETRIQ  50 MG TB24 tablet, TAKE (1) TABLET BY MOUTH AT BEDTIME., Disp: 30 tablet, Rfl: 11   olmesartan (BENICAR) 40 MG tablet, Take 40 mg by mouth daily., Disp: , Rfl:    pantoprazole  (PROTONIX ) 40 MG tablet, Take 40 mg by mouth daily., Disp: , Rfl:    Psyllium (METAMUCIL PO), Take 1 Dose by mouth daily as needed (constipation). 1 dose = 1 teaspoonful, Disp: , Rfl:    sertraline  (ZOLOFT ) 100 MG tablet, Take 100 mg by mouth at bedtime., Disp: , Rfl:    simvastatin  (ZOCOR ) 10 MG tablet, Take 10 mg by mouth at bedtime., Disp: , Rfl:    solifenacin  (VESICARE ) 10 MG tablet, TAKE (1) TABLET BY MOUTH AT BEDTIME., Disp: 30 tablet, Rfl: 11   sulfaSALAzine  (AZULFIDINE ) 500 MG tablet, Take 1 tablet (500 mg total) by mouth 2 (two) times daily., Disp: 60 tablet, Rfl: 2   vitamin E 400 UNIT capsule, Take 400 Units by mouth daily., Disp: , Rfl:   Past Medical History: Past Medical History:  Diagnosis Date   Anxiety    Arthritis    Cancer (HCC)    face- removed in office   Crohn disease (HCC)    Depression    DVT (deep venous thrombosis) (HCC) 07/27/2017   LLE  GERD (gastroesophageal reflux disease)    HTN (hypertension)    Hyperlipidemia    Neuropathy    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 Abel Hoe & Dr. Honey Lusty   Severe aortic stenosis    Varicose veins of bilateral lower extremities with other complications     Tobacco Use: Social History   Tobacco Use  Smoking Status Former   Current packs/day: 0.50   Average packs/day: 0.5 packs/day for 20.0 years (10.0 ttl pk-yrs)   Types: Cigarettes  Smokeless Tobacco Never    Labs: Review Flowsheet       Latest Ref Rng & Units 11/22/2021 07/07/2022 10/07/2022  Labs for ITP Cardiac and Pulmonary Rehab  PH, Arterial 7.35 - 7.45 - 7.300  -   PCO2 arterial 32 - 48 mmHg - 54.1  -  Bicarbonate 20.0 - 28.0 mmol/L - 26.6  29.0  -  TCO2 22 - 32 mmol/L 30  28  31  26    O2 Saturation % - 94  74  -    Details       Multiple values from one day are sorted in reverse-chronological order         Capillary Blood Glucose: Lab Results  Component Value Date   GLUCAP 87 09/26/2022   GLUCAP 112 (H) 11/22/2021   GLUCAP 124 (H) 06/06/2016   GLUCAP 125 (H) 06/06/2016     Exercise Target Goals: Exercise Program Goal: Individual exercise prescription set using results from initial 6 min walk test and THRR while considering  patient's activity barriers and safety.   Exercise Prescription Goal: Starting with aerobic activity 30 plus minutes a day, 3 days per week for initial exercise prescription. Provide home exercise prescription and guidelines that participant acknowledges understanding prior to discharge.  Activity Barriers & Risk Stratification:   6 Minute Walk:   Oxygen  Initial Assessment:   Oxygen  Re-Evaluation:   Oxygen  Discharge (Final Oxygen  Re-Evaluation):   Initial Exercise Prescription:   Perform Capillary Blood Glucose checks as needed.  Exercise Prescription Changes:   Exercise Prescription Changes     Row Name 02/26/23 1500             Response to Exercise   Blood Pressure (Admit) 145/55       Blood Pressure (Exit) 114/52       Heart Rate (Admit) 69 bpm       Heart Rate (Exercise) 106 bpm       Heart Rate (Exit) 73 bpm       Rating of Perceived Exertion (Exercise) 12       Duration Continue with 30 min of aerobic exercise without signs/symptoms of physical distress.       Intensity THRR unchanged         Progression   Progression Continue to progress workloads to maintain intensity without signs/symptoms of physical distress.         Resistance Training   Training Prescription Yes       Weight 2 lbs       Reps 10-15         NuStep   Level 2       SPM 63       Minutes 15        METs 1.9         Arm Ergometer   Level 2       RPM 54  Minutes 15       METs 1.9         Oxygen    Maintain Oxygen  Saturation 88% or higher                Exercise Comments:   Exercise Goals and Review:   Exercise Goals Re-Evaluation :  Exercise Goals Re-Evaluation     Row Name 01/27/23 1520 03/02/23 0821           Exercise Goal Re-Evaluation   Exercise Goals Review Increase Strength and Stamina;Improve claudication pain tolerance and improve walking ability;Increase Physical Activity Increase Physical Activity;Increase Strength and Stamina;Understanding of Exercise Prescription      Comments Lousie is doing well when she comes to rehab. She is dependent on transpertation from her daughter and is unreliable to come twice a week. She is still wanting to [facility] [employer] with her [facility]. She stated that she has been decorating for Christmas and has been needing to take breaks here and there due to fatigue. Lousie is doing well in rehab. She does not come to class regulary due to traspertation. Will continue to monitor and progress as able,      Expected Outcomes Short: attend rehab regulaly basis long term: exericse at home or [facility] come to class the two days a week                Discharge Exercise Prescription (Final Exercise Prescription Changes):  Exercise Prescription Changes - 02/26/23 1500       Response to Exercise   Blood Pressure (Admit) 145/55    Blood Pressure (Exit) 114/52    Heart Rate (Admit) 69 bpm    Heart Rate (Exercise) 106 bpm    Heart Rate (Exit) 73 bpm    Rating of Perceived Exertion (Exercise) 12    Duration Continue with 30 min of aerobic exercise without signs/symptoms of physical distress.    Intensity THRR unchanged      Progression   Progression Continue to progress workloads to maintain intensity without signs/symptoms of physical distress.      Resistance Training   Training Prescription Yes    Weight 2 lbs    Reps 10-15       NuStep   Level 2    SPM 63    Minutes 15    METs 1.9      Arm Ergometer   Level 2    RPM 54    Minutes 15    METs 1.9      Oxygen    Maintain Oxygen  Saturation 88% or higher             Nutrition:  Target Goals: Understanding of nutrition guidelines, daily intake of sodium 1500mg , cholesterol 200mg , calories 30% from fat and 7% or less from saturated fats, daily to have 5 or more servings of fruits and vegetables.  Biometrics:    Nutrition Therapy Plan and Nutrition Goals:   Nutrition Assessments:  MEDIFICTS Score Key: >=70 Need to make dietary changes  40-70 Heart Healthy Diet <= 40 Therapeutic Level Cholesterol Diet  Flowsheet Row CARDIAC REHAB PHASE II ORIENTATION from 11/10/2022 in [facility] CARDIAC REHABILITATION  Picture Your Plate Total Score on Admission 26      Picture Your Plate Scores: <56 Unhealthy dietary pattern with much room for improvement. 41-50 Dietary pattern unlikely to meet recommendations for good health and room for improvement. 51-60 More healthful dietary pattern, with some room for improvement.  >60 Healthy dietary pattern, although there  may be some specific behaviors that could be improved.    Nutrition Goals Re-Evaluation:  Nutrition Goals Re-Evaluation     Row Name 01/27/23 1537             Goals   Nutrition Goal Healthy heathy       Comment Rimsha still contonues to eat larger portion meals but has been trying to cut back. She loves sweets and ice cream. She continues to try to pick healthier options and likes chicken and salmon and does eat some ground beef each week. She continues to only drink water and one cup of coffee during the day.       Expected Outcome Short term: try to cut back on sweets and choice smaller portion control   long term: contiue to pick healthy options for meal                Nutrition Goals Discharge (Final Nutrition Goals Re-Evaluation):  Nutrition Goals Re-Evaluation - 01/27/23  1537       Goals   Nutrition Goal Healthy heathy    Comment Tykia still contonues to eat larger portion meals but has been trying to cut back. She loves sweets and ice cream. She continues to try to pick healthier options and likes chicken and salmon and does eat some ground beef each week. She continues to only drink water and one cup of coffee during the day.    Expected Outcome Short term: try to cut back on sweets and choice smaller portion control   long term: contiue to pick healthy options for meal             Psychosocial: Target Goals: Acknowledge presence or absence of significant depression and/or stress, maximize coping skills, provide positive support system. Participant is able to verbalize types and ability to use techniques and skills needed for reducing stress and depression.  Initial Review & Psychosocial Screening:   Quality of Life Scores:  Scores of 19 and below usually indicate a poorer quality of life in these areas.  A difference of  2-3 points is a clinically meaningful difference.  A difference of 2-3 points in the total score of the Quality of Life Index has been associated with significant improvement in overall quality of life, self-image, physical symptoms, and general health in studies assessing change in quality of life.  PHQ-9: Review Flowsheet       02/19/2023 12/25/2022 11/10/2022  Depression screen PHQ 2/9  Decreased Interest 0 2 3  Down, Depressed, Hopeless 1 2 3   PHQ - 2 Score 1 4 6   Altered sleeping - 2 3  Tired, decreased energy - 2 3  Change in appetite - 1 2  Feeling bad or failure about yourself  - 0 0  Trouble concentrating - 0 1  Moving slowly or fidgety/restless - 1 1  Suicidal thoughts - 0 0  PHQ-9 Score - 10 16  Difficult doing work/chores - Somewhat difficult Somewhat difficult   Interpretation of Total Score  Total Score Depression Severity:  1-4 = Minimal depression, 5-9 = Mild depression, 10-14 = Moderate depression,  15-19 = Moderately severe depression, 20-27 = Severe depression   Psychosocial Evaluation and Intervention:   Psychosocial Re-Evaluation:  Psychosocial Re-Evaluation     Row Name 01/27/23 1528             Psychosocial Re-Evaluation   Current issues with History of Depression;Current Depression;Current Sleep Concerns;Current Stress Concerns       Comments Lousie is  okay with rehab when she comes. 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 She is dependent of her daughter for transpertation and it is making it hard for her to come to rehab regulary. She coninues to worry about her granddaughter who left home over a year ago and has not contacted the family.This continues to be her main stressor in life her family. She goes to her [employer] small group to find friends and joy.       Expected Outcomes Short term: continue to pray about granddaughter to help fing relef in stress.   long term: continue to find joy and be positive       Interventions Stress management education;Relaxation education;Encouraged to attend Cardiac Rehabilitation for the exercise       Continue Psychosocial Services  Follow up required by staff         Initial Review   Source of Stress Concerns Family                Psychosocial Discharge (Final Psychosocial Re-Evaluation):  Psychosocial Re-Evaluation - 01/27/23 1528       Psychosocial Re-Evaluation   Current issues with History of Depression;Current Depression;Current Sleep Concerns;Current Stress Concerns    Comments Lousie is okay with rehab when she comes. 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 She is dependent of her daughter for transpertation and it is making it hard for her to come to rehab regulary. She coninues to worry about her granddaughter who left home over a year ago and has not contacted the family.This continues to be her main stressor in life her family. She goes to her O[employer] small group to find friends and joy.     Expected Outcomes Short term: continue to pray about granddaughter to help fing relef in stress.   long term: continue to find joy and be positive    Interventions Stress management education;Relaxation education;Encouraged to attend Cardiac Rehabilitation for the exercise    Continue Psychosocial Services  Follow up required by staff      Initial Review   Source of Stress Concerns Family             Vocational Rehabilitation: Provide vocational rehab assistance to qualifying candidates.   Vocational Rehab Evaluation & Intervention:   Education: Education Goals: Education classes will be provided on a weekly basis, covering required topics. Participant will state understanding/return demonstration of topics presented.  Learning Barriers/Preferences:   Education Topics: Hypertension, Hypertension Reduction -Define heart disease and high blood pressure. Discus how high blood pressure affects the body and ways to reduce high blood pressure.   Exercise and Your Heart -Discuss why it is important to exercise, the FITT principles of exercise, normal and abnormal responses to exercise, and how to exercise safely.   Angina -Discuss definition of angina, causes of angina, treatment of angina, and how to decrease risk of having angina. Flowsheet Row CARDIAC REHAB PHASE II EXERCISE from 05/07/2023 in [place] CARDIAC REHABILITATION  Date 11/13/22  Educator [facility]       Cardiac Medications -Review what the following cardiac medications are used for, how they affect the body, and side effects that may occur when taking the medications.  Medications include Aspirin , Beta blockers, calcium channel blockers, ACE Inhibitors, angiotensin receptor blockers, diuretics, digoxin, and antihyperlipidemics. Flowsheet Row CARDIAC REHAB PHASE II EXERCISE from 05/07/2023 in [place] CARDIAC REHABILITATION  Date 01/01/23  Educator hb  Instruction Review Code 1- Verbalizes Understanding  Congestive Heart Failure -Discuss the definition of CHF, how to live with CHF, the signs and symptoms of CHF, and how keep track of weight and sodium intake. Flowsheet Row CARDIAC REHAB PHASE II EXERCISE from 05/07/2023 in [place] CARDIAC REHABILITATION  Date 12/25/22  Educator jh  Instruction Review Code 1- Verbalizes Understanding       Heart Disease and Intimacy -Discus the effect sexual activity has on the heart, how changes occur during intimacy as we age, and safety during sexual activity.   Smoking Cessation / COPD -Discuss different methods to quit smoking, the health benefits of quitting smoking, and the definition of COPD.   Nutrition I: Fats -Discuss the types of cholesterol, what cholesterol does to the heart, and how cholesterol levels can be controlled. Flowsheet Row CARDIAC REHAB PHASE II EXERCISE from 05/07/2023 in [place] CARDIAC REHABILITATION  Date 12/11/22  Educator HB  Instruction Review Code 1- Verbalizes Understanding       Nutrition II: Labels -Discuss the different components of food labels and how to read food label   Heart Parts/Heart Disease and PAD -Discuss the anatomy of the heart, the pathway of blood circulation through the heart, and these are affected by heart disease.   Stress I: Signs and Symptoms -Discuss the causes of stress, how stress may lead to anxiety and depression, and ways to limit stress. Flowsheet Row CARDIAC REHAB PHASE II EXERCISE from 05/07/2023 in [place] CARDIAC REHABILITATION  Date 03/05/23  Educator hb  Instruction Review Code 1- Verbalizes Understanding       Stress II: Relaxation -Discuss different types of relaxation techniques to limit stress. Flowsheet Row CARDIAC REHAB PHASE II EXERCISE from 05/07/2023 in [place] CARDIAC REHABILITATION  Date 04/23/23  Educator [facility]  Instruction Review Code 1- Verbalizes Understanding       Warning Signs of Stroke / TIA -Discuss definition of a stroke,  what the signs and symptoms are of a stroke, and how to identify when someone is having stroke.   Knowledge Questionnaire Score:   Core Components/Risk Factors/Patient Goals at Admission:   Core Components/Risk Factors/Patient Goals Review:   Goals and Risk Factor Review     Row Name 01/27/23 1540             Core Components/Risk Factors/Patient Goals Review   Personal Goals Review Weight Management/Obesity;Stress       Review Lousie does not weight at home but has not gained weight. She is trying to pick healthy options to help with weight but eats larger portions. She loves going to her church and being with her small group at [facility]. She stated that being with her small groups help her talk to friends and stress in her life.       Expected Outcomes Short term: try to do daily weight checks    long termL continue to attend small group for joy and to help with stress                Core Components/Risk Factors/Patient Goals at Discharge (Final Review):   Goals and Risk Factor Review - 01/27/23 1540       Core Components/Risk Factors/Patient Goals Review   Personal Goals Review Weight Management/Obesity;Stress    Review Lousie does not weight at home but has not gained weight. She is trying to pick healthy options to help with weight but eats larger portions. She loves going to her church and being with her small group at [facility]. She stated that being with  her small groups help her talk to friends and stress in her life.    Expected Outcomes Short term: try to do daily weight checks    long termL continue to attend small group for joy and to help with stress             ITP Comments:  ITP Comments     Row Name 01/28/23 1548 02/24/23 1445 03/25/23 1147 04/22/23 1424 05/20/23 1253   ITP Comments 30 day review completed. ITP sent to Dr. Armida Lander, Medical Director of Cardiac Rehab. Continue with ITP unless changes are made by physician. 30 day review completed. ITP sent  to Dr. Armida Lander, Medical Director of Cardiac Rehab. Continue with ITP unless changes are made by physician. Pt has not attended since 01/27/23.  Unable to assess goals this round. 30 day review completed. ITP sent to Dr. Armida Lander, Medical Director of Cardiac Rehab. Continue with ITP unless changes are made by physician. 30 day review completed. ITP sent to Dr. Armida Lander, Medical Director of Cardiac Rehab. Continue with ITP unless changes are made by physician. Has not attended since 03/26/23. 30 day review completed. ITP sent to Dr. Armida Lander, Medical Director of Cardiac Rehab. Continue with ITP unless changes are made by physician. Attendance continues to be spotty.    Row Name 06/17/23 1519 07/15/23 1156 07/24/23 0946       ITP Comments 30 day review completed. ITP sent to Dr. Armida Lander, Medical Director of Cardiac Rehab. Continue with ITP unless changes are made by physician. Patient has not attended since 05/14/23, unable to assess for goals 30 day review completed. ITP sent to Dr. Armida Lander, Medical Director of Cardiac Rehab. Continue with ITP unless changes are made by physician. Patient has not attended since 05/14/23, unable to assess for goals.  Sent letter end of 36 weeks is 07/24/23. Patient is discharged. She completed 26 sessions.              Comments: Discharge ITP.

## 2023-07-24 NOTE — Addendum Note (Signed)
 Encounter addended by: [place], RN on: 07/24/2023 9:53 AM  Actions taken: Flowsheet accepted, Clinical Note Signed

## 2023-07-27 ENCOUNTER — Ambulatory Visit: Payer: Medicare Other | Attending: Cardiovascular Disease | Admitting: Cardiovascular Disease

## 2023-07-27 VITALS — BP 100/70 | HR 74 | Ht 62.0 in | Wt 169.0 lb

## 2023-07-27 DIAGNOSIS — E782 Mixed hyperlipidemia: Secondary | ICD-10-CM | POA: Diagnosis not present

## 2023-07-27 DIAGNOSIS — Z952 Presence of prosthetic heart valve: Secondary | ICD-10-CM

## 2023-07-27 DIAGNOSIS — I350 Nonrheumatic aortic (valve) stenosis: Secondary | ICD-10-CM

## 2023-07-27 DIAGNOSIS — I10 Essential (primary) hypertension: Secondary | ICD-10-CM

## 2023-07-27 DIAGNOSIS — N1831 Chronic kidney disease, stage 3a: Secondary | ICD-10-CM | POA: Diagnosis not present

## 2023-07-27 DIAGNOSIS — N3281 Overactive bladder: Secondary | ICD-10-CM | POA: Diagnosis not present

## 2023-07-27 NOTE — Patient Instructions (Signed)
 Medication Instructions:  Your physician recommends that you continue on your current medications as directed. Please refer to the Current Medication list given to you today.  *If you need a refill on your cardiac medications before your next appointment, please call your pharmacy*  Lab Work: NONE   If you have labs (blood work) drawn today and your tests are completely normal, you will receive your results only by: MyChart Message (if you have MyChart) OR A paper copy in the mail If you have any lab test that is abnormal or we need to change your treatment, we will call you to review the results.  Testing/Procedures: NONE   Follow-Up: At [facility], you and your health needs are our priority.  As part of our continuing mission to provide you with exceptional heart care, our providers are all part of one team.  This team includes your primary Cardiologist (physician) and Advanced Practice Providers or APPs (Physician Assistants and Nurse Practitioners) who all work together to provide you with the care you need, when you need it.  Your next appointment:   1 year(s)  Provider:   You may see Janelle Mediate, MD or one of the following Advanced Practice Providers on your designated Care Team:   Woodfin Hays, PA-C  [place], [place] Theotis Flake, [place]     We recommend signing up for the patient portal called "MyChart".  Sign up information is provided on this After Visit Summary.  MyChart is used to connect with patients for Virtual Visits (Telemedicine).  Patients are able to view lab/test results, encounter notes, upcoming appointments, etc.  Non-urgent messages can be sent to your provider as well.   To learn more about what you can do with MyChart, go to [email or web address].   Other Instructions Thank you for choosing [facility]!

## 2023-08-12 DIAGNOSIS — T63461A Toxic effect of venom of wasps, accidental (unintentional), initial encounter: Secondary | ICD-10-CM | POA: Diagnosis not present

## 2023-08-18 DIAGNOSIS — I700 Atherosclerosis of aorta: Secondary | ICD-10-CM | POA: Diagnosis not present

## 2023-08-18 DIAGNOSIS — G629 Polyneuropathy, unspecified: Secondary | ICD-10-CM | POA: Diagnosis not present

## 2023-08-18 DIAGNOSIS — G894 Chronic pain syndrome: Secondary | ICD-10-CM | POA: Diagnosis not present

## 2023-08-18 DIAGNOSIS — K219 Gastro-esophageal reflux disease without esophagitis: Secondary | ICD-10-CM | POA: Diagnosis not present

## 2023-08-18 DIAGNOSIS — N1831 Chronic kidney disease, stage 3a: Secondary | ICD-10-CM | POA: Diagnosis not present

## 2023-08-18 DIAGNOSIS — I10 Essential (primary) hypertension: Secondary | ICD-10-CM | POA: Diagnosis not present

## 2023-08-18 DIAGNOSIS — N3281 Overactive bladder: Secondary | ICD-10-CM | POA: Diagnosis not present

## 2023-08-18 DIAGNOSIS — C3411 Malignant neoplasm of upper lobe, right bronchus or lung: Secondary | ICD-10-CM | POA: Diagnosis not present

## 2023-08-18 DIAGNOSIS — E782 Mixed hyperlipidemia: Secondary | ICD-10-CM | POA: Diagnosis not present

## 2023-08-18 DIAGNOSIS — K5080 Crohn's disease of both small and large intestine without complications: Secondary | ICD-10-CM | POA: Diagnosis not present

## 2023-08-18 DIAGNOSIS — E559 Vitamin D deficiency, unspecified: Secondary | ICD-10-CM | POA: Diagnosis not present

## 2023-08-25 DIAGNOSIS — E782 Mixed hyperlipidemia: Secondary | ICD-10-CM | POA: Diagnosis not present

## 2023-08-25 DIAGNOSIS — K219 Gastro-esophageal reflux disease without esophagitis: Secondary | ICD-10-CM | POA: Diagnosis not present

## 2023-08-25 DIAGNOSIS — I10 Essential (primary) hypertension: Secondary | ICD-10-CM | POA: Diagnosis not present

## 2023-09-08 DIAGNOSIS — G894 Chronic pain syndrome: Secondary | ICD-10-CM | POA: Diagnosis not present

## 2023-09-08 DIAGNOSIS — I700 Atherosclerosis of aorta: Secondary | ICD-10-CM | POA: Diagnosis not present

## 2023-09-08 DIAGNOSIS — N3281 Overactive bladder: Secondary | ICD-10-CM | POA: Diagnosis not present

## 2023-09-08 DIAGNOSIS — K5080 Crohn's disease of both small and large intestine without complications: Secondary | ICD-10-CM | POA: Diagnosis not present

## 2023-09-08 DIAGNOSIS — C3411 Malignant neoplasm of upper lobe, right bronchus or lung: Secondary | ICD-10-CM | POA: Diagnosis not present

## 2023-09-08 DIAGNOSIS — N1831 Chronic kidney disease, stage 3a: Secondary | ICD-10-CM | POA: Diagnosis not present

## 2023-09-08 DIAGNOSIS — G629 Polyneuropathy, unspecified: Secondary | ICD-10-CM | POA: Diagnosis not present

## 2023-09-08 DIAGNOSIS — E559 Vitamin D deficiency, unspecified: Secondary | ICD-10-CM | POA: Diagnosis not present

## 2023-09-08 DIAGNOSIS — K219 Gastro-esophageal reflux disease without esophagitis: Secondary | ICD-10-CM | POA: Diagnosis not present

## 2023-09-08 DIAGNOSIS — I10 Essential (primary) hypertension: Secondary | ICD-10-CM | POA: Diagnosis not present

## 2023-09-08 DIAGNOSIS — E782 Mixed hyperlipidemia: Secondary | ICD-10-CM | POA: Diagnosis not present

## 2023-09-21 DIAGNOSIS — C44319 Basal cell carcinoma of skin of other parts of face: Secondary | ICD-10-CM | POA: Diagnosis not present

## 2023-09-25 DIAGNOSIS — E559 Vitamin D deficiency, unspecified: Secondary | ICD-10-CM | POA: Diagnosis not present

## 2023-09-25 DIAGNOSIS — N3281 Overactive bladder: Secondary | ICD-10-CM | POA: Diagnosis not present

## 2023-09-25 DIAGNOSIS — E782 Mixed hyperlipidemia: Secondary | ICD-10-CM | POA: Diagnosis not present

## 2023-09-25 DIAGNOSIS — I10 Essential (primary) hypertension: Secondary | ICD-10-CM | POA: Diagnosis not present

## 2023-09-25 DIAGNOSIS — G629 Polyneuropathy, unspecified: Secondary | ICD-10-CM | POA: Diagnosis not present

## 2023-09-25 DIAGNOSIS — I700 Atherosclerosis of aorta: Secondary | ICD-10-CM | POA: Diagnosis not present

## 2023-09-25 DIAGNOSIS — C3411 Malignant neoplasm of upper lobe, right bronchus or lung: Secondary | ICD-10-CM | POA: Diagnosis not present

## 2023-09-25 DIAGNOSIS — K5080 Crohn's disease of both small and large intestine without complications: Secondary | ICD-10-CM | POA: Diagnosis not present

## 2023-09-25 DIAGNOSIS — N1831 Chronic kidney disease, stage 3a: Secondary | ICD-10-CM | POA: Diagnosis not present

## 2023-09-25 DIAGNOSIS — K219 Gastro-esophageal reflux disease without esophagitis: Secondary | ICD-10-CM | POA: Diagnosis not present

## 2023-09-25 DIAGNOSIS — G894 Chronic pain syndrome: Secondary | ICD-10-CM | POA: Diagnosis not present

## 2023-09-26 DIAGNOSIS — K5080 Crohn's disease of both small and large intestine without complications: Secondary | ICD-10-CM | POA: Diagnosis not present

## 2023-09-26 DIAGNOSIS — E782 Mixed hyperlipidemia: Secondary | ICD-10-CM | POA: Diagnosis not present

## 2023-09-26 DIAGNOSIS — I10 Essential (primary) hypertension: Secondary | ICD-10-CM | POA: Diagnosis not present

## 2023-09-26 DIAGNOSIS — K219 Gastro-esophageal reflux disease without esophagitis: Secondary | ICD-10-CM | POA: Diagnosis not present

## 2023-09-27 NOTE — Progress Notes (Unsigned)
 [facility]   MULTIDISCIPLINARY HEART VALVE CLINIC                                     Cardiology Office Note:    Date:  09/28/2023   ID:  Barbara Lucero, DOB 1940-12-21, MRN [id number]  PCP:  Shona Norleen PEDLAR, MD  [facility] Cardiologist:  Maude Emmer, MD  [facility] Structural heart: Lonni Cash, MD [facility] Electrophysiologist:  None   Referring MD: Shona Norleen PEDLAR, MD   1 year s/p TAVR  History of Present Illness:    Barbara Lucero is a 83 y.o. female with a hx of anxiety, depression, Crohn's disease, prior DVT, HTN, HLD, bronchogenic carcinoma/non-small cell lung CA and severe aortic stenosis s/p TAVR 10/07/22 who presents to clinic for follow up.   [name] had an echocardiogram 04/2022 that showed LVEF 60-65% and severe aortic stenosis with calcified, thickened leaflets, mean gradient 45.7 mmHg, R/LHC showed nonobstructive CAD. Pre TAVR CTs showed an enlarging pulmonary nodule highly suspicious for malignancy. 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 Not felt to be a good resection candidate and underwent radiation. S/p TAVR with a 23 mm Edwards Sapien 3 THV via the TF approach on 10/07/22. Post operative echo with normal LV function, mean gradient at . One month echo showed a mean gradient up 26 mm hg and follow up CT showed insignificant HALT and anticoagulation not felt to be indicated. Dr. Emmer felt like it was related to hyperdynamic LV function.   Today the patient presents to clinic for follow up. No CP or SOB. No LE edema, orthopnea or PND. No dizziness or syncope. No blood in stool or urine. No palpitations. Here with daughter. They work out at [employer] together several times a week. Has lost about 15 lbs.      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 Cash & Dr. Maryjane   Severe aortic stenosis    Varicose veins of bilateral lower extremities with other complications      Current Medications: Current Meds  Medication Sig   acetaminophen  (TYLENOL ) 500 MG tablet Take 1,000 mg by mouth 2 (two) times daily.   albuterol  (VENTOLIN  HFA) 108 (90 Base) MCG/ACT inhaler Inhale 2 puffs into the lungs every 4 (four) hours as needed for shortness of breath or wheezing.   ALPRAZolam  (XANAX ) 0.25 MG tablet Take 0.25 mg by mouth at bedtime.   Ascorbic Acid (VITAMIN C) 1000 MG tablet Take 1,000 mg by mouth daily.   aspirin  EC 81 MG tablet Take 81 mg by mouth at bedtime. Swallow whole.   b complex vitamins tablet Take 1 tablet by mouth daily.   buPROPion (WELLBUTRIN) 75 MG tablet 75 mg 2 (two) times daily.   Calcium Carb-Cholecalciferol (CALCIUM 600+D) 600-20 MG-MCG TABS Take 1 tablet by mouth daily.   Cholecalciferol (VITAMIN D3) 50 MCG (2000 UT)  capsule Take 2,000 Units by mouth daily.   cyanocobalamin  (VITAMIN B12) 1000 MCG tablet Take 1,000 mcg by mouth daily as needed (energy).   gabapentin  (NEURONTIN ) 100 MG capsule Take 1 capsule (100 mg total) by mouth 2 (two) times daily.   hydrocortisone cream 1 % Apply 1 Application topically 2 (two) times daily as needed (rash).   Multiple Vitamins-Minerals (HAIR SKIN & NAILS) TABS Take 3 tablets by mouth daily.   Multiple Vitamins-Minerals (MULTIVITAMIN WITH MINERALS) tablet Take 1 tablet by mouth daily.   MYRBETRIQ  50 MG TB24 tablet TAKE (1) TABLET BY MOUTH AT BEDTIME.   olmesartan (BENICAR) 40 MG tablet Take 40 mg by mouth daily.   pantoprazole  (PROTONIX ) 40 MG tablet Take 40 mg by mouth daily.   Psyllium (METAMUCIL PO) Take 1 Dose by mouth daily as needed  (constipation). 1 dose = 1 teaspoonful   sertraline  (ZOLOFT ) 100 MG tablet Take 100 mg by mouth at bedtime.   simvastatin  (ZOCOR ) 10 MG tablet Take 10 mg by mouth at bedtime.   solifenacin  (VESICARE ) 10 MG tablet TAKE (1) TABLET BY MOUTH AT BEDTIME.   sulfaSALAzine  (AZULFIDINE ) 500 MG tablet Take 1 tablet (500 mg total) by mouth 2 (two) times daily.   traMADol  (ULTRAM ) 50 MG tablet as needed for moderate pain (pain score 4-6).   vitamin E 400 UNIT capsule Take 400 Units by mouth daily.   [DISCONTINUED] furosemide  (LASIX ) 20 MG tablet Take 20 mg by mouth every Wednesday.      ROS:   Please see the history of present illness.    All other systems reviewed and are negative.  EKGs       Risk Assessment/Calculations:           Physical Exam:    VS:  BP (!) 100/56 (BP Location: Left Arm)   Pulse 67   Ht 5' 2 (1.575 m)   Wt 166 lb (75.3 kg)   SpO2 93%   BMI 30.36 kg/m     Wt Readings from Last 3 Encounters:  09/28/23 166 lb (75.3 kg)  07/27/23 169 lb (76.7 kg)  07/16/23 172 lb (78 kg)     GEN: Well nourished, well developed in no acute distress NECK: No JVD CARDIAC: RRR, 2/6 flow murmur @ RUSB. No rubs, gallops RESPIRATORY:  Clear to auscultation without rales, wheezing or rhonchi  ABDOMEN: Soft, non-tender, non-distended EXTREMITIES:  No edema; No deformity.  ASSESSMENT:    1. S/P TAVR (transcatheter aortic valve replacement)   2. 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 Primary hypertension     PLAN:    In order of problems listed above:  Severe AS s/p TAVR:  -- Post operative echo with normal LV function, mean gradient at . One month echo showed a mean gradient up 26 mm hg and follow up CT showed insignificant HALT and anticoagulation not felt to be indicated.  Dr. Delford felt like it was related to hyperdynamic LV function.  -- Echo today shows EF 60%, normally functioning TAVR with a mean gradient of 21 mm hg and no  PVL. VMax and DVI have not changed.  -- NYHA class I symptoms.  -- Continue Aspirin  81mg  daily.  -- SBE prophylaxis discussed; the patient is edentulous and does not go to the dentist.  -- Continue regular follow up with Nishan.  Bronchogenic carcinoma/non-small cell lung CA of the RUL: -- XRT Rx completed 03/16/23.   HTN: -- BP on lower side.  95/50 on my personal recheck.  -- Has lost some weight with diet and exercise.  -- Will change Lasix  40mg  PRN (was supposed to be once a week but daughter said that she has been taking it daily).  -- if BP remains low or she becomes symptomatic would decrease her Benicar 40mg  daily.   Medication Adjustments/Labs and Tests Ordered: Current medicines are reviewed at length with the patient today.  Concerns regarding medicines are outlined above.  No orders of the defined types were placed in this encounter.  Meds ordered this encounter  Medications   furosemide  (LASIX ) 20 MG tablet    Sig: Take 1 tablet (20 mg total) by mouth as needed.    Supervising Provider:   WONDA SHARPER [[id number]]    Patient Instructions  Medication Instructions:  Change Lasix  to as needed.   *If you need a refill on your cardiac medications before your next appointment, please call your pharmacy*  Lab Work: None ordered  If you have labs (blood work) drawn today and your tests are completely normal, you will receive your results only by: MyChart Message (if you have MyChart) OR A paper copy in the mail If you have any lab test that is abnormal or we need to change your treatment, we will call you to review the results.  Testing/Procedures: None ordered  Follow-Up: At [facility], you and your health needs are our priority.  As part of our continuing mission to provide you with exceptional heart care, our providers are all part of one team.  This team includes your primary Cardiologist (physician) and Advanced Practice Providers or APPs (Physician  Assistants and Nurse Practitioners) who all work together to provide you with the care you need, when you need it.  Your next appointment:   6 month(s)  Provider:   Maude Emmer, MD    We recommend signing up for the patient portal called MyChart.  Sign up information is provided on this After Visit Summary.  MyChart is used to connect with patients for Virtual Visits (Telemedicine).  Patients are able to view lab/test results, encounter notes, upcoming appointments, etc.  Non-urgent messages can be sent to your provider as well.   To learn more about what you can do with MyChart, go to [email or web address].   Other Instructions        Signed, Lamarr Hummer, PA-C  09/28/2023 2:32 PM    [facility]

## 2023-09-28 ENCOUNTER — Ambulatory Visit: Payer: Self-pay | Admitting: Physician Assistant

## 2023-09-28 ENCOUNTER — Ambulatory Visit
Admission: RE | Admit: 2023-09-28 | Discharge: 2023-09-28 | Disposition: A | Discharge status: Home / Self Care | Payer: Medicare Other | Source: Ambulatory Visit | Attending: Cardiology | Admitting: Cardiology

## 2023-09-28 ENCOUNTER — Ambulatory Visit: Payer: Medicare Other | Admitting: Physician Assistant

## 2023-09-28 VITALS — BP 100/56 | HR 67 | Ht 62.0 in | Wt 166.0 lb

## 2023-09-28 DIAGNOSIS — Z952 Presence of prosthetic heart valve: Secondary | ICD-10-CM | POA: Insufficient documentation

## 2023-09-28 DIAGNOSIS — I10 Essential (primary) hypertension: Secondary | ICD-10-CM | POA: Insufficient documentation

## 2023-09-28 DIAGNOSIS — C3411 Malignant neoplasm of upper lobe, right bronchus or lung: Secondary | ICD-10-CM

## 2023-09-28 LAB — ECHOCARDIOGRAM COMPLETE
AR max vel: 1.87 cm2
AV Area VTI: 2.02 cm2
AV Area mean vel: 1.89 cm2
AV Mean grad: 21 mmHg
AV Peak grad: 37.8 mmHg
Ao pk vel: 3.08 m/s
Area-P 1/2: 2.33 cm2
S' Lateral: 2.9 cm

## 2023-09-28 MED ORDER — FUROSEMIDE 20 MG PO TABS: 20.0000 mg | ORAL_TABLET | ORAL | Status: AC | PRN

## 2023-09-28 NOTE — Patient Instructions (Addendum)
 Medication Instructions:  Change Lasix  to as needed.   *If you need a refill on your cardiac medications before your next appointment, please call your pharmacy*  Lab Work: None ordered  If you have labs (blood work) drawn today and your tests are completely normal, you will receive your results only by: MyChart Message (if you have MyChart) OR A paper copy in the mail If you have any lab test that is abnormal or we need to change your treatment, we will call you to review the results.  Testing/Procedures: None ordered  Follow-Up: At [facility], you and your health needs are our priority.  As part of our continuing mission to provide you with exceptional heart care, our providers are all part of one team.  This team includes your primary Cardiologist (physician) and Advanced Practice Providers or APPs (Physician Assistants and Nurse Practitioners) who all work together to provide you with the care you need, when you need it.  Your next appointment:   6 month(s)  Provider:   Maude Emmer, MD    We recommend signing up for the patient portal called MyChart.  Sign up information is provided on this After Visit Summary.  MyChart is used to connect with patients for Virtual Visits (Telemedicine).  Patients are able to view lab/test results, encounter notes, upcoming appointments, etc.  Non-urgent messages can be sent to your provider as well.   To learn more about what you can do with MyChart, go to [email or web address].   Other Instructions

## 2023-10-01 ENCOUNTER — Encounter: Payer: Self-pay | Admitting: Emergency Medicine

## 2023-10-01 ENCOUNTER — Ambulatory Visit: Admitting: Emergency Medicine

## 2023-10-01 VITALS — BP 126/74 | HR 90 | Temp 97.8°F | Ht 62.0 in | Wt 164.0 lb

## 2023-10-01 DIAGNOSIS — J45909 Unspecified asthma, uncomplicated: Secondary | ICD-10-CM | POA: Insufficient documentation

## 2023-10-01 DIAGNOSIS — J4520 Mild intermittent asthma, uncomplicated: Secondary | ICD-10-CM

## 2023-10-01 DIAGNOSIS — C3411 Malignant neoplasm of upper lobe, right bronchus or lung: Secondary | ICD-10-CM

## 2023-10-01 MED ORDER — ALBUTEROL SULFATE HFA 108 (90 BASE) MCG/ACT IN AERS: 2.0000 | INHALATION_SPRAY | RESPIRATORY_TRACT | 0 refills | Status: AC | PRN

## 2023-10-01 NOTE — Assessment & Plan Note (Signed)
 Her dyspnea was principally due to her aortic valve disease and now she is doing much better.  Very rare albuterol  use.  I will refill this so she can keep it available to use if needed.

## 2023-10-01 NOTE — Progress Notes (Signed)
 Subjective:    Patient ID: Barbara Lucero, female    DOB: 1941-01-31, 83 y.o.   MRN: [id number]  HPI   ROV 10/01/2023 --83 year old woman who is a former smoker with hypertension, AS status post TAVR, Crohn's, hyperlipidemia.  We have been following her for pulmonary nodule on chest imaging which prompted navigational bronchoscopy on 09/01/2022 for biopsy of the right upper lobe nodule.  The cytology showed atypical cells.  There was hypermetabolism on PET scan and suspicion for malignancy was high so she was referred for SBRT in January 2025. She did well - feels well. Has more energy. Breathing does not seem to be limiting her but her joint pain does. Has rarely needed any albuterol .   CT chest 06/13/2023 reviewed by me showed small scattered mediastinal nodes, decrease in size of the posterior right upper lobe lesion now 16 mm, no other new nodules or evidence of local spread.   Review of Systems As per HPI  Past Medical History:  Diagnosis Date   Anxiety    Arthritis    Cancer (HCC)    face- removed in office   Crohn disease (HCC)    Depression    DVT (deep venous thrombosis) (HCC) 07/27/2017   LLE   GERD (gastroesophageal reflux disease)    HTN (hypertension)    Hyperlipidemia    Neuropathy    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 Verlin & Dr. 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  Tobacco Use   Smoking status: Former    Current packs/day: 0.50    Average packs/day: 0.5 packs/day for 20.0 years (10.0 ttl pk-yrs)    Types: Cigarettes   Smokeless tobacco: Never  Vaping Use   Vaping status: Never Used  Substance and Sexual Activity   Alcohol use: No   Drug use: No   Sexual activity: Not Currently    Birth control/protection: Post-menopausal  Other Topics Concern   Not on file  Social History Narrative   Not on file   Social Drivers of Health   Financial Resource Strain: High Risk (02/20/2023)   Overall Financial Resource Strain (CARDIA)    Difficulty of Paying Living Expenses: Hard  Food Insecurity: No Food Insecurity (02/19/2023)   Hunger Vital Sign    Worried About Running Out of Food in the Last Year: Never true    Ran Out of Food in the Last Year: Never true  Transportation Needs: No Transportation Needs (02/19/2023)   PRAPARE - Administrator, Civil Service (Medical): No    Lack of Transportation (Non-Medical): No  Physical Activity: Not on file  Stress: Not on file  Social Connections:  Not on file  Intimate Partner Violence: Not At Risk (02/19/2023)   Humiliation, Afraid, Rape, and Kick questionnaire    Fear of Current or Ex-Partner: No    Emotionally Abused: No    Physically Abused: No    Sexually Abused: No      Allergies  Allergen Reactions   Coconut (Cocos Nucifera) Hives   Latex Hives     Outpatient Medications Prior to Visit  Medication Sig Dispense Refill   acetaminophen  (TYLENOL ) 500 MG tablet Take 1,000 mg by mouth 2 (two) times daily.     ALPRAZolam  (XANAX ) 0.25 MG tablet Take 0.25 mg by mouth at bedtime.     Ascorbic Acid (VITAMIN C) 1000 MG tablet Take 1,000 mg by mouth daily.     aspirin  EC 81 MG tablet Take 81 mg by mouth at bedtime. Swallow whole.     b complex vitamins tablet Take 1 tablet by mouth daily.     buPROPion (WELLBUTRIN) 75 MG tablet 75 mg 2 (two) times  daily.     Calcium Carb-Cholecalciferol (CALCIUM 600+D) 600-20 MG-MCG TABS Take 1 tablet by mouth daily.     Cholecalciferol (VITAMIN D3) 50 MCG (2000 UT) capsule Take 2,000 Units by mouth daily.     cyanocobalamin  (VITAMIN B12) 1000 MCG tablet Take 1,000 mcg by mouth daily as needed (energy).     furosemide  (LASIX ) 20 MG tablet Take 1 tablet (20 mg total) by mouth as needed.     gabapentin  (NEURONTIN ) 100 MG capsule Take 1 capsule (100 mg total) by mouth 2 (two) times daily.     hydrocortisone cream 1 % Apply 1 Application topically 2 (two) times daily as needed (rash).     Multiple Vitamins-Minerals (HAIR SKIN & NAILS) TABS Take 3 tablets by mouth daily.     Multiple Vitamins-Minerals (MULTIVITAMIN WITH MINERALS) tablet Take 1 tablet by mouth daily.     MYRBETRIQ  50 MG TB24 tablet TAKE (1) TABLET BY MOUTH AT BEDTIME. 30 tablet 11   olmesartan (BENICAR) 40 MG tablet Take 40 mg by mouth daily.     pantoprazole  (PROTONIX ) 40 MG tablet Take 40 mg by mouth daily.     Psyllium (METAMUCIL PO) Take 1 Dose by mouth daily as needed (constipation). 1 dose = 1 teaspoonful     sertraline  (ZOLOFT ) 100 MG tablet Take 100 mg by mouth at bedtime.     simvastatin  (ZOCOR ) 10 MG tablet Take 10 mg by mouth at bedtime.     solifenacin  (VESICARE ) 10 MG tablet TAKE (1) TABLET BY MOUTH AT BEDTIME. 30 tablet 11   sulfaSALAzine  (AZULFIDINE ) 500 MG tablet Take 1 tablet (500 mg total) by mouth 2 (two) times daily. 60 tablet 2   traMADol  (ULTRAM ) 50 MG tablet as needed for moderate pain (pain score 4-6).     vitamin E 400 UNIT capsule Take 400 Units by mouth daily.     albuterol  (VENTOLIN  HFA) 108 (90 Base) MCG/ACT inhaler Inhale 2 puffs into the lungs every 4 (four) hours as needed for shortness of breath or wheezing.     No facility-administered medications prior to visit.        Objective:   Physical Exam Vitals:   10/01/23 1355  BP: 126/74  Pulse: 90  Temp: 97.8 F (36.6 C)  TempSrc: Temporal  SpO2: 93%   Weight: 164 lb (74.4 kg)  Height: 5' 2 (1.575 m)   Gen: Pleasant, overwt woman, in no distress,  normal affect  ENT: No lesions,  mouth clear,  oropharynx clear, no postnasal drip  Neck: No JVD, no stridor  Lungs: No use of accessory muscles, no crackles or wheezing on normal respiration, no wheeze on forced expiration  Cardiovascular: RRR, 3/6 systolic murmur.  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 Get your repeat scan as per plans by Dr. Sherrod in November  Asthma Her dyspnea was principally due to her aortic valve disease and now she is doing much better.  Very rare albuterol  use.  I will refill this so she can keep it available to use if needed.    Lamar Chris, MD, PhD 10/01/2023, 2:12 PM [facility] [phone] or if no answer before 7:00PM call [phone] For any issues after 7:00PM please call eLink [phone]

## 2023-10-01 NOTE — Patient Instructions (Signed)
 We reviewed her CT scan of the chest from April today. Get your repeat scan as per plans by Dr. Sherrod in November Keep your albuterol  available to use 2 puffs if needed for shortness of breath, chest tightness, wheezing.  We will refill this for you today. Follow Dr. Shelah in 1 year, sooner if you have any problems.

## 2023-10-01 NOTE — Assessment & Plan Note (Signed)
 We reviewed her CT scan of the chest from April today. Get your repeat scan as per plans by Dr. Sherrod in November

## 2023-10-16 DIAGNOSIS — E785 Hyperlipidemia, unspecified: Secondary | ICD-10-CM | POA: Diagnosis not present

## 2023-10-16 DIAGNOSIS — R7303 Prediabetes: Secondary | ICD-10-CM | POA: Diagnosis not present

## 2023-10-16 DIAGNOSIS — E559 Vitamin D deficiency, unspecified: Secondary | ICD-10-CM | POA: Diagnosis not present

## 2023-10-19 ENCOUNTER — Other Ambulatory Visit: Payer: Self-pay | Admitting: Obstetrics & Gynecology

## 2023-10-23 DIAGNOSIS — E782 Mixed hyperlipidemia: Secondary | ICD-10-CM | POA: Diagnosis not present

## 2023-10-23 DIAGNOSIS — G894 Chronic pain syndrome: Secondary | ICD-10-CM | POA: Diagnosis not present

## 2023-10-23 DIAGNOSIS — N1831 Chronic kidney disease, stage 3a: Secondary | ICD-10-CM | POA: Diagnosis not present

## 2023-10-23 DIAGNOSIS — N3281 Overactive bladder: Secondary | ICD-10-CM | POA: Diagnosis not present

## 2023-10-23 DIAGNOSIS — K5080 Crohn's disease of both small and large intestine without complications: Secondary | ICD-10-CM | POA: Diagnosis not present

## 2023-10-23 DIAGNOSIS — I700 Atherosclerosis of aorta: Secondary | ICD-10-CM | POA: Diagnosis not present

## 2023-10-23 DIAGNOSIS — G629 Polyneuropathy, unspecified: Secondary | ICD-10-CM | POA: Diagnosis not present

## 2023-10-23 DIAGNOSIS — C3411 Malignant neoplasm of upper lobe, right bronchus or lung: Secondary | ICD-10-CM | POA: Diagnosis not present

## 2023-10-23 DIAGNOSIS — I10 Essential (primary) hypertension: Secondary | ICD-10-CM | POA: Diagnosis not present

## 2023-10-23 DIAGNOSIS — K219 Gastro-esophageal reflux disease without esophagitis: Secondary | ICD-10-CM | POA: Diagnosis not present

## 2023-10-23 DIAGNOSIS — E559 Vitamin D deficiency, unspecified: Secondary | ICD-10-CM | POA: Diagnosis not present

## 2023-10-26 DIAGNOSIS — I10 Essential (primary) hypertension: Secondary | ICD-10-CM | POA: Diagnosis not present

## 2023-10-26 DIAGNOSIS — N3281 Overactive bladder: Secondary | ICD-10-CM | POA: Diagnosis not present

## 2023-10-26 DIAGNOSIS — E782 Mixed hyperlipidemia: Secondary | ICD-10-CM | POA: Diagnosis not present

## 2023-11-13 DIAGNOSIS — E782 Mixed hyperlipidemia: Secondary | ICD-10-CM | POA: Diagnosis not present

## 2023-11-13 DIAGNOSIS — K5080 Crohn's disease of both small and large intestine without complications: Secondary | ICD-10-CM | POA: Diagnosis not present

## 2023-11-13 DIAGNOSIS — I10 Essential (primary) hypertension: Secondary | ICD-10-CM | POA: Diagnosis not present

## 2023-11-13 DIAGNOSIS — E559 Vitamin D deficiency, unspecified: Secondary | ICD-10-CM | POA: Diagnosis not present

## 2023-11-13 DIAGNOSIS — G629 Polyneuropathy, unspecified: Secondary | ICD-10-CM | POA: Diagnosis not present

## 2023-11-13 DIAGNOSIS — N1831 Chronic kidney disease, stage 3a: Secondary | ICD-10-CM | POA: Diagnosis not present

## 2023-11-13 DIAGNOSIS — I700 Atherosclerosis of aorta: Secondary | ICD-10-CM | POA: Diagnosis not present

## 2023-11-13 DIAGNOSIS — G894 Chronic pain syndrome: Secondary | ICD-10-CM | POA: Diagnosis not present

## 2023-11-13 DIAGNOSIS — C3411 Malignant neoplasm of upper lobe, right bronchus or lung: Secondary | ICD-10-CM | POA: Diagnosis not present

## 2023-11-13 DIAGNOSIS — N3281 Overactive bladder: Secondary | ICD-10-CM | POA: Diagnosis not present

## 2023-11-13 DIAGNOSIS — K219 Gastro-esophageal reflux disease without esophagitis: Secondary | ICD-10-CM | POA: Diagnosis not present

## 2023-11-26 ENCOUNTER — Ambulatory Visit: Admitting: Internal Medicine

## 2023-11-26 ENCOUNTER — Telehealth: Payer: Self-pay | Admitting: *Deleted

## 2023-11-26 ENCOUNTER — Telehealth (HOSPITAL_BASED_OUTPATIENT_CLINIC_OR_DEPARTMENT_OTHER): Payer: Self-pay

## 2023-11-26 VITALS — BP 104/50 | HR 74 | Temp 98.4°F | Ht 62.0 in | Wt 166.0 lb

## 2023-11-26 DIAGNOSIS — R1319 Other dysphagia: Secondary | ICD-10-CM

## 2023-11-26 DIAGNOSIS — K5090 Crohn's disease, unspecified, without complications: Secondary | ICD-10-CM

## 2023-11-26 DIAGNOSIS — K50919 Crohn's disease, unspecified, with unspecified complications: Secondary | ICD-10-CM

## 2023-11-26 DIAGNOSIS — K219 Gastro-esophageal reflux disease without esophagitis: Secondary | ICD-10-CM

## 2023-11-26 DIAGNOSIS — R1310 Dysphagia, unspecified: Secondary | ICD-10-CM

## 2023-11-26 DIAGNOSIS — R195 Other fecal abnormalities: Secondary | ICD-10-CM

## 2023-11-26 DIAGNOSIS — D509 Iron deficiency anemia, unspecified: Secondary | ICD-10-CM | POA: Diagnosis not present

## 2023-11-26 NOTE — Patient Instructions (Signed)
 We will schedule you for upper endoscopy and colonoscopy to further evaluate your low blood counts and blood in your stool.  I may elect to stretch your esophagus as well to help with your swallowing depending on findings.  We will reach out to your cardiologist for clearance prior to scheduling.  Once we have heard back from them we will reach out to you.  Continue on sulfasalazine .  It was very nice seeing both you today.  Dr. Cindie

## 2023-11-26 NOTE — Telephone Encounter (Signed)
   Name: Barbara Lucero  DOB: 1940-04-23  MRN: [id number]  Primary Cardiologist: Maude Emmer, MD   Preoperative team, please contact this patient and set up a phone call appointment for further preoperative risk assessment. Please obtain consent and complete medication review. Thank you for your help.  I confirm that guidance regarding antiplatelet and oral anticoagulation therapy has been completed and, if necessary, noted below.  I also confirmed the patient resides in the state of [place] . As per [employer] telemedicine laws, the patient must reside in the state in which the provider is licensed.   Jon Nat Hails, PA 11/26/2023, 12:51 PM [facility]

## 2023-11-26 NOTE — H&P (View-Only) (Signed)
 Primary Care Physician:  Shona Norleen PEDLAR, MD Primary Gastroenterologist:  Dr. Cindie  Chief Complaint  Patient presents with   New Patient (Initial Visit)    Pt referred for blood in stool. Nausea, and she has been weak and tiredness X month. Crohns     HPI:   Barbara Lucero is a 83 y.o. female who presents to clinic today by referral from her PCP Dr. Shona for evaluation.  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  Crohn's disease: she states she was diagnosed in her 54s.  States she takes sulfasalazine  on an as-needed basis.  Has not seen a GI doctor in many years.  States she saw Dr. Golda many years ago.  Has been managing her Crohn's disease on her own.  No history of bowel resection.  Last colonoscopy greater than 10 years ago.   Admitted to [facility] 11/23/2021 after presenting with abdominal pain, diarrhea.  CT abdomen pelvis with contrast which I personally reviewed showed severe colitis of the descending and sigmoid colon. Patient was taking ibuprofen twice daily for 1 to 2 weeks for arthritic pains. Previously on Tylenol  arthritis. No recent antibiotics. No sick contacts at home.  Stool studies not collected as patient's diarrhea had abruptly stopped.  Was treated with IV Solu-Medrol  inpatient and discharged on prednisone  taper.  Unfortunately she did not follow-up in our clinic after discharge.  Today, denies any abdominal pain.  Occasional bouts of diarrhea depending on what she eats.  Taking sulfasalazine  twice daily.  Denies any mucus in her stool.  No hematochezia.  Does note dark stools due to chronic iron therapy.  Blood work from 10/19/2023 with hemoglobin 9.7, TIBC of 244, iron level 43, iron saturation 18 (no ferritin checked).  B12 and folate levels normal.  CT angio abdomen pelvis 07/10/2022 with colonic diverticulosis without  diverticulitis.  Hepatic steatosis, cholelithiasis without acute cholecystitis.  Iron deficiency anemia: Blood work as above.  Also noted to have heme positive stool March 2025.   Chronic GERD, esophageal dysphagia: Well-controlled on pantoprazole .  Denies any epigastric pain or chest pain.  Denies any breakthrough symptoms.  Does note progressively worsening esophageal dysphagia.  Has to cut up her food very small pieces.  Occasional food regurgitation.  Past Medical History:  Diagnosis Date   Anxiety    Arthritis    Cancer (HCC)    face- removed in office   Crohn disease (HCC)    Depression    DVT (deep venous thrombosis) (HCC) 07/27/2017   LLE   GERD (gastroesophageal reflux disease)    HTN (hypertension)    Hyperlipidemia    Neuropathy    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 Verlin & Dr. Maryjane   Severe aortic stenosis    Varicose veins of bilateral lower extremities with other complications     Past Surgical History:  Procedure Laterality Date   BOWEL RESECTION  1970   BRONCHIAL BIOPSY  09/01/2022   Procedure: BRONCHIAL BIOPSIES;  Surgeon: Shelah Lamar RAMAN, MD;  Location: [facility] ENDOSCOPY;  Service: Pulmonary;;   BRONCHIAL BRUSHINGS  09/01/2022   Procedure: BRONCHIAL BRUSHINGS;  Surgeon: Shelah Lamar RAMAN, MD;  Location: [facility] ENDOSCOPY;  Service: Pulmonary;;   BRONCHIAL NEEDLE ASPIRATION BIOPSY  09/01/2022   Procedure: BRONCHIAL NEEDLE ASPIRATION BIOPSIES;  Surgeon: Shelah,  Lamar RAMAN, MD;  Location: [facility] ENDOSCOPY;  Service: Pulmonary;;   EYE SURGERY Bilateral    cataract   FIDUCIAL MARKER PLACEMENT  09/01/2022   Procedure: FIDUCIAL MARKER PLACEMENT;  Surgeon: Shelah Lamar RAMAN, MD;  Location: [facility] ENDOSCOPY;  Service: Pulmonary;;   HIP ARTHROPLASTY Left 06/06/2016   Procedure: ARTHROPLASTY BIPOLAR HIP (HEMIARTHROPLASTY);  Surgeon: Taft FORBES Minerva, MD;  Location: [facility] ORS;  Service: Orthopedics;  Laterality:  Left;   INTRAOPERATIVE TRANSTHORACIC ECHOCARDIOGRAM N/A 10/07/2022   Procedure: INTRAOPERATIVE TRANSTHORACIC ECHOCARDIOGRAM;  Surgeon: Verlin Lonni BIRCH, MD;  Location: [facility] INVASIVE CV LAB;  Service: Open Heart Surgery;  Laterality: N/A;   RIGHT HEART CATH AND CORONARY ANGIOGRAPHY N/A 07/07/2022   Procedure: RIGHT HEART CATH AND CORONARY ANGIOGRAPHY;  Surgeon: Verlin Lonni BIRCH, MD;  Location: [facility] INVASIVE CV LAB;  Service: Cardiovascular;  Laterality: N/A;   TRANSCATHETER AORTIC VALVE REPLACEMENT, TRANSFEMORAL Right 10/07/2022   Procedure: Transcatheter Aortic Valve Replacement, Transfemoral;  Surgeon: Verlin Lonni BIRCH, MD;  Location: [facility] INVASIVE CV LAB;  Service: Open Heart Surgery;  Laterality: Right;    Current Outpatient Medications  Medication Sig Dispense Refill   acetaminophen  (TYLENOL ) 500 MG tablet Take 1,000 mg by mouth 2 (two) times daily.     albuterol  (VENTOLIN  HFA) 108 (90 Base) MCG/ACT inhaler Inhale 2 puffs into the lungs every 4 (four) hours as needed for shortness of breath or wheezing. 6.7 g 0   ALPRAZolam  (XANAX ) 0.25 MG tablet Take 0.25 mg by mouth at bedtime.     Ascorbic Acid (VITAMIN C) 1000 MG tablet Take 1,000 mg by mouth daily.     aspirin  EC 81 MG tablet Take 81 mg by mouth at bedtime. Swallow whole.     b complex vitamins tablet Take 1 tablet by mouth daily.     buPROPion (WELLBUTRIN) 75 MG tablet 75 mg 2 (two) times daily.     Calcium Carb-Cholecalciferol (CALCIUM 600+D) 600-20 MG-MCG TABS Take 1 tablet by mouth daily.     Cholecalciferol (VITAMIN D3) 50 MCG (2000 UT) capsule Take 2,000 Units by mouth daily.     cyanocobalamin  (VITAMIN B12) 1000 MCG tablet Take 1,000 mcg by mouth daily as needed (energy).     ferrous sulfate 325 (65 FE) MG tablet Take 325 mg by mouth daily with breakfast.     furosemide  (LASIX ) 20 MG tablet Take 1 tablet (20 mg total) by mouth as needed.     gabapentin  (NEURONTIN ) 100 MG capsule Take 1 capsule (100 mg total) by  mouth 2 (two) times daily.     hydrocortisone cream 1 % Apply 1 Application topically 2 (two) times daily as needed (rash).     Multiple Vitamins-Minerals (HAIR SKIN & NAILS) TABS Take 3 tablets by mouth daily.     Multiple Vitamins-Minerals (MULTIVITAMIN WITH MINERALS) tablet Take 1 tablet by mouth daily.     MYRBETRIQ  50 MG TB24 tablet TAKE (1) TABLET BY MOUTH AT BEDTIME. 30 tablet 11   olmesartan (BENICAR) 40 MG tablet Take 40 mg by mouth daily.     pantoprazole  (PROTONIX ) 40 MG tablet Take 40 mg by mouth daily.     Psyllium (METAMUCIL PO) Take 1 Dose by mouth daily as needed (constipation). 1 dose = 1 teaspoonful     sertraline  (ZOLOFT ) 100 MG tablet Take 100 mg by mouth at bedtime.     simvastatin  (ZOCOR ) 10 MG tablet Take 10 mg by mouth at bedtime.     solifenacin  (VESICARE ) 10 MG tablet TAKE (1) TABLET BY MOUTH  AT BEDTIME. 30 tablet 11   sulfaSALAzine  (AZULFIDINE ) 500 MG tablet Take 1 tablet (500 mg total) by mouth 2 (two) times daily. 60 tablet 2   traMADol  (ULTRAM ) 50 MG tablet as needed for moderate pain (pain score 4-6).     vitamin E 400 UNIT capsule Take 400 Units by mouth daily.     No current facility-administered medications for this visit.    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  Tobacco Use   Smoking status: Former    Current packs/day: 0.50    Average packs/day: 0.5 packs/day for 20.0 years  (10.0 ttl pk-yrs)    Types: Cigarettes   Smokeless tobacco: Never  Vaping Use   Vaping status: Never Used  Substance and Sexual Activity   Alcohol use: No   Drug use: No   Sexual activity: Not Currently    Birth control/protection: Post-menopausal  Other Topics Concern   Not on file  Social History Narrative   Not on file   Social Drivers of Health   Financial Resource Strain: High Risk (02/20/2023)   Overall Financial Resource Strain (CARDIA)    Difficulty of Paying Living Expenses: Hard  Food Insecurity: No Food Insecurity (02/19/2023)   Hunger Vital Sign    Worried About Running Out of Food in the Last Year: Never true    Ran Out of Food in the Last Year: Never true  Transportation Needs: No Transportation Needs (02/19/2023)   PRAPARE - Administrator, Civil Service (Medical): No    Lack of Transportation (Non-Medical): No  Physical Activity: Not on file  Stress: Not on file  Social Connections: Not on file  Intimate Partner Violence: Not At Risk (02/19/2023)   Humiliation, Afraid, Rape, and Kick questionnaire    Fear of Current or Ex-Partner: No    Emotionally Abused: No    Physically Abused: No    Sexually Abused: No    Subjective: Review of Systems  Constitutional:  Negative for chills and fever.  HENT:  Negative for congestion and hearing loss.   Eyes:  Negative for blurred vision and double vision.  Respiratory:  Negative for cough and shortness of breath.   Cardiovascular:  Negative for chest pain and palpitations.  Gastrointestinal:  Positive for heartburn. Negative for abdominal pain, blood in stool, constipation, diarrhea, melena and vomiting.       Dysphagia  Genitourinary:  Negative for dysuria and urgency.  Musculoskeletal:  Negative for joint pain and myalgias.  Skin:  Negative for itching and rash.  Neurological:  Negative for dizziness and headaches.  Psychiatric/Behavioral:  Negative for depression. The patient is not nervous/anxious.         Objective: BP (!) 104/50   Pulse 74   Temp 98.4 F (  36.9 C)   Ht 5' 2 (1.575 m)   Wt 166 lb (75.3 kg)   BMI 30.36 kg/m  Physical Exam Constitutional:      Appearance: Normal appearance.  HENT:     Head: Normocephalic and atraumatic.  Eyes:     Extraocular Movements: Extraocular movements intact.     Conjunctiva/sclera: Conjunctivae normal.  Cardiovascular:     Rate and Rhythm: Normal rate and regular rhythm.     Heart sounds: Murmur heard.  Pulmonary:     Effort: Pulmonary effort is normal.     Breath sounds: Normal breath sounds.  Abdominal:     General: Bowel sounds are normal.     Palpations: Abdomen is soft.  Musculoskeletal:        General: No swelling. Normal range of motion.     Cervical back: Normal range of motion and neck supple.  Skin:    General: Skin is warm and dry.     Coloration: Skin is not jaundiced.  Neurological:     General: No focal deficit present.     Mental Status: She is alert and oriented to person, place, and time.  Psychiatric:        Mood and Affect: Mood normal.        Behavior: Behavior normal.      Assessment/Plan:  1.  Crohn's disease-discussed in depth with patient today.  Has been noncompliant with follow-ups in the past.  No colonoscopy in over 10 years.  Recent blood work reviewed.  Appears to be in clinical remission at this time.  Continue sulfasalazine  twice daily.  Will schedule for colonoscopy.  2.  Chronic GERD, esophageal dysphagia-at same time as colonoscopy, will perform EGD with possible dilation.  The risks including infection, bleed, or perforation as well as benefits, limitations, alternatives and imponderables have been reviewed with the patient. Questions have been answered. All parties agreeable.  Continue on pantoprazole  daily.  Given her cardiac history, we will reach out to her cardiologist for clearance prior to scheduling.  Appreciate their help immensely.  3.  Iron deficiency anemia, heme  positive stool-EGD and colonoscopy to further evaluate as above.  Continue on oral iron.  Thank you Dr. Shona for the kind referral.  11/26/2023 11:14 AM

## 2023-11-26 NOTE — Progress Notes (Signed)
 Primary Care Physician:  Shona Norleen PEDLAR, MD Primary Gastroenterologist:  Dr. Cindie  Chief Complaint  Patient presents with   New Patient (Initial Visit)    Pt referred for blood in stool. Nausea, and she has been weak and tiredness X month. Crohns     HPI:   Barbara Lucero is a 83 y.o. female who presents to clinic today by referral from her PCP Dr. Shona for evaluation.  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  Crohn's disease: she states she was diagnosed in her 54s.  States she takes sulfasalazine  on an as-needed basis.  Has not seen a GI doctor in many years.  States she saw Dr. Golda many years ago.  Has been managing her Crohn's disease on her own.  No history of bowel resection.  Last colonoscopy greater than 10 years ago.   Admitted to [facility] 11/23/2021 after presenting with abdominal pain, diarrhea.  CT abdomen pelvis with contrast which I personally reviewed showed severe colitis of the descending and sigmoid colon. Patient was taking ibuprofen twice daily for 1 to 2 weeks for arthritic pains. Previously on Tylenol  arthritis. No recent antibiotics. No sick contacts at home.  Stool studies not collected as patient's diarrhea had abruptly stopped.  Was treated with IV Solu-Medrol  inpatient and discharged on prednisone  taper.  Unfortunately she did not follow-up in our clinic after discharge.  Today, denies any abdominal pain.  Occasional bouts of diarrhea depending on what she eats.  Taking sulfasalazine  twice daily.  Denies any mucus in her stool.  No hematochezia.  Does note dark stools due to chronic iron therapy.  Blood work from 10/19/2023 with hemoglobin 9.7, TIBC of 244, iron level 43, iron saturation 18 (no ferritin checked).  B12 and folate levels normal.  CT angio abdomen pelvis 07/10/2022 with colonic diverticulosis without  diverticulitis.  Hepatic steatosis, cholelithiasis without acute cholecystitis.  Iron deficiency anemia: Blood work as above.  Also noted to have heme positive stool March 2025.   Chronic GERD, esophageal dysphagia: Well-controlled on pantoprazole .  Denies any epigastric pain or chest pain.  Denies any breakthrough symptoms.  Does note progressively worsening esophageal dysphagia.  Has to cut up her food very small pieces.  Occasional food regurgitation.  Past Medical History:  Diagnosis Date   Anxiety    Arthritis    Cancer (HCC)    face- removed in office   Crohn disease (HCC)    Depression    DVT (deep venous thrombosis) (HCC) 07/27/2017   LLE   GERD (gastroesophageal reflux disease)    HTN (hypertension)    Hyperlipidemia    Neuropathy    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 Verlin & Dr. Maryjane   Severe aortic stenosis    Varicose veins of bilateral lower extremities with other complications     Past Surgical History:  Procedure Laterality Date   BOWEL RESECTION  1970   BRONCHIAL BIOPSY  09/01/2022   Procedure: BRONCHIAL BIOPSIES;  Surgeon: Shelah Lamar RAMAN, MD;  Location: [facility] ENDOSCOPY;  Service: Pulmonary;;   BRONCHIAL BRUSHINGS  09/01/2022   Procedure: BRONCHIAL BRUSHINGS;  Surgeon: Shelah Lamar RAMAN, MD;  Location: [facility] ENDOSCOPY;  Service: Pulmonary;;   BRONCHIAL NEEDLE ASPIRATION BIOPSY  09/01/2022   Procedure: BRONCHIAL NEEDLE ASPIRATION BIOPSIES;  Surgeon: Shelah,  Lamar RAMAN, MD;  Location: [facility] ENDOSCOPY;  Service: Pulmonary;;   EYE SURGERY Bilateral    cataract   FIDUCIAL MARKER PLACEMENT  09/01/2022   Procedure: FIDUCIAL MARKER PLACEMENT;  Surgeon: Shelah Lamar RAMAN, MD;  Location: [facility] ENDOSCOPY;  Service: Pulmonary;;   HIP ARTHROPLASTY Left 06/06/2016   Procedure: ARTHROPLASTY BIPOLAR HIP (HEMIARTHROPLASTY);  Surgeon: Taft FORBES Minerva, MD;  Location: [facility] ORS;  Service: Orthopedics;  Laterality:  Left;   INTRAOPERATIVE TRANSTHORACIC ECHOCARDIOGRAM N/A 10/07/2022   Procedure: INTRAOPERATIVE TRANSTHORACIC ECHOCARDIOGRAM;  Surgeon: Verlin Lonni BIRCH, MD;  Location: [facility] INVASIVE CV LAB;  Service: Open Heart Surgery;  Laterality: N/A;   RIGHT HEART CATH AND CORONARY ANGIOGRAPHY N/A 07/07/2022   Procedure: RIGHT HEART CATH AND CORONARY ANGIOGRAPHY;  Surgeon: Verlin Lonni BIRCH, MD;  Location: [facility] INVASIVE CV LAB;  Service: Cardiovascular;  Laterality: N/A;   TRANSCATHETER AORTIC VALVE REPLACEMENT, TRANSFEMORAL Right 10/07/2022   Procedure: Transcatheter Aortic Valve Replacement, Transfemoral;  Surgeon: Verlin Lonni BIRCH, MD;  Location: [facility] INVASIVE CV LAB;  Service: Open Heart Surgery;  Laterality: Right;    Current Outpatient Medications  Medication Sig Dispense Refill   acetaminophen  (TYLENOL ) 500 MG tablet Take 1,000 mg by mouth 2 (two) times daily.     albuterol  (VENTOLIN  HFA) 108 (90 Base) MCG/ACT inhaler Inhale 2 puffs into the lungs every 4 (four) hours as needed for shortness of breath or wheezing. 6.7 g 0   ALPRAZolam  (XANAX ) 0.25 MG tablet Take 0.25 mg by mouth at bedtime.     Ascorbic Acid (VITAMIN C) 1000 MG tablet Take 1,000 mg by mouth daily.     aspirin  EC 81 MG tablet Take 81 mg by mouth at bedtime. Swallow whole.     b complex vitamins tablet Take 1 tablet by mouth daily.     buPROPion (WELLBUTRIN) 75 MG tablet 75 mg 2 (two) times daily.     Calcium Carb-Cholecalciferol (CALCIUM 600+D) 600-20 MG-MCG TABS Take 1 tablet by mouth daily.     Cholecalciferol (VITAMIN D3) 50 MCG (2000 UT) capsule Take 2,000 Units by mouth daily.     cyanocobalamin  (VITAMIN B12) 1000 MCG tablet Take 1,000 mcg by mouth daily as needed (energy).     ferrous sulfate 325 (65 FE) MG tablet Take 325 mg by mouth daily with breakfast.     furosemide  (LASIX ) 20 MG tablet Take 1 tablet (20 mg total) by mouth as needed.     gabapentin  (NEURONTIN ) 100 MG capsule Take 1 capsule (100 mg total) by  mouth 2 (two) times daily.     hydrocortisone cream 1 % Apply 1 Application topically 2 (two) times daily as needed (rash).     Multiple Vitamins-Minerals (HAIR SKIN & NAILS) TABS Take 3 tablets by mouth daily.     Multiple Vitamins-Minerals (MULTIVITAMIN WITH MINERALS) tablet Take 1 tablet by mouth daily.     MYRBETRIQ  50 MG TB24 tablet TAKE (1) TABLET BY MOUTH AT BEDTIME. 30 tablet 11   olmesartan (BENICAR) 40 MG tablet Take 40 mg by mouth daily.     pantoprazole  (PROTONIX ) 40 MG tablet Take 40 mg by mouth daily.     Psyllium (METAMUCIL PO) Take 1 Dose by mouth daily as needed (constipation). 1 dose = 1 teaspoonful     sertraline  (ZOLOFT ) 100 MG tablet Take 100 mg by mouth at bedtime.     simvastatin  (ZOCOR ) 10 MG tablet Take 10 mg by mouth at bedtime.     solifenacin  (VESICARE ) 10 MG tablet TAKE (1) TABLET BY MOUTH  AT BEDTIME. 30 tablet 11   sulfaSALAzine  (AZULFIDINE ) 500 MG tablet Take 1 tablet (500 mg total) by mouth 2 (two) times daily. 60 tablet 2   traMADol  (ULTRAM ) 50 MG tablet as needed for moderate pain (pain score 4-6).     vitamin E 400 UNIT capsule Take 400 Units by mouth daily.     No current facility-administered medications for this visit.    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  Tobacco Use   Smoking status: Former    Current packs/day: 0.50    Average packs/day: 0.5 packs/day for 20.0 years  (10.0 ttl pk-yrs)    Types: Cigarettes   Smokeless tobacco: Never  Vaping Use   Vaping status: Never Used  Substance and Sexual Activity   Alcohol use: No   Drug use: No   Sexual activity: Not Currently    Birth control/protection: Post-menopausal  Other Topics Concern   Not on file  Social History Narrative   Not on file   Social Drivers of Health   Financial Resource Strain: High Risk (02/20/2023)   Overall Financial Resource Strain (CARDIA)    Difficulty of Paying Living Expenses: Hard  Food Insecurity: No Food Insecurity (02/19/2023)   Hunger Vital Sign    Worried About Running Out of Food in the Last Year: Never true    Ran Out of Food in the Last Year: Never true  Transportation Needs: No Transportation Needs (02/19/2023)   PRAPARE - Administrator, Civil Service (Medical): No    Lack of Transportation (Non-Medical): No  Physical Activity: Not on file  Stress: Not on file  Social Connections: Not on file  Intimate Partner Violence: Not At Risk (02/19/2023)   Humiliation, Afraid, Rape, and Kick questionnaire    Fear of Current or Ex-Partner: No    Emotionally Abused: No    Physically Abused: No    Sexually Abused: No    Subjective: Review of Systems  Constitutional:  Negative for chills and fever.  HENT:  Negative for congestion and hearing loss.   Eyes:  Negative for blurred vision and double vision.  Respiratory:  Negative for cough and shortness of breath.   Cardiovascular:  Negative for chest pain and palpitations.  Gastrointestinal:  Positive for heartburn. Negative for abdominal pain, blood in stool, constipation, diarrhea, melena and vomiting.       Dysphagia  Genitourinary:  Negative for dysuria and urgency.  Musculoskeletal:  Negative for joint pain and myalgias.  Skin:  Negative for itching and rash.  Neurological:  Negative for dizziness and headaches.  Psychiatric/Behavioral:  Negative for depression. The patient is not nervous/anxious.         Objective: BP (!) 104/50   Pulse 74   Temp 98.4 F (  36.9 C)   Ht 5' 2 (1.575 m)   Wt 166 lb (75.3 kg)   BMI 30.36 kg/m  Physical Exam Constitutional:      Appearance: Normal appearance.  HENT:     Head: Normocephalic and atraumatic.  Eyes:     Extraocular Movements: Extraocular movements intact.     Conjunctiva/sclera: Conjunctivae normal.  Cardiovascular:     Rate and Rhythm: Normal rate and regular rhythm.     Heart sounds: Murmur heard.  Pulmonary:     Effort: Pulmonary effort is normal.     Breath sounds: Normal breath sounds.  Abdominal:     General: Bowel sounds are normal.     Palpations: Abdomen is soft.  Musculoskeletal:        General: No swelling. Normal range of motion.     Cervical back: Normal range of motion and neck supple.  Skin:    General: Skin is warm and dry.     Coloration: Skin is not jaundiced.  Neurological:     General: No focal deficit present.     Mental Status: She is alert and oriented to person, place, and time.  Psychiatric:        Mood and Affect: Mood normal.        Behavior: Behavior normal.      Assessment/Plan:  1.  Crohn's disease-discussed in depth with patient today.  Has been noncompliant with follow-ups in the past.  No colonoscopy in over 10 years.  Recent blood work reviewed.  Appears to be in clinical remission at this time.  Continue sulfasalazine  twice daily.  Will schedule for colonoscopy.  2.  Chronic GERD, esophageal dysphagia-at same time as colonoscopy, will perform EGD with possible dilation.  The risks including infection, bleed, or perforation as well as benefits, limitations, alternatives and imponderables have been reviewed with the patient. Questions have been answered. All parties agreeable.  Continue on pantoprazole  daily.  Given her cardiac history, we will reach out to her cardiologist for clearance prior to scheduling.  Appreciate their help immensely.  3.  Iron deficiency anemia, heme  positive stool-EGD and colonoscopy to further evaluate as above.  Continue on oral iron.  Thank you Dr. Shona for the kind referral.  11/26/2023 11:14 AM

## 2023-11-26 NOTE — Telephone Encounter (Signed)
  Patient Consent for Virtual Visit         Barbara Lucero has provided verbal consent on 11/26/2023 for a virtual visit (video or telephone). Appointment scheduled for 12/02/2023 @ 3:20pm Med req and consent are complete. Call patient at [phone].    CONSENT FOR VIRTUAL VISIT FOR:  Barbara Lucero  By participating in this virtual visit I agree to the following:  I hereby voluntarily request, consent and authorize [facility] and its employed or contracted physicians, physician assistants, nurse practitioners or other licensed health care professionals (the Practitioner), to provide me with telemedicine health care services (the "Services) as deemed necessary by the treating Practitioner. I acknowledge and consent to receive the Services by the Practitioner via telemedicine. I understand that the telemedicine visit will involve communicating with the Practitioner through live audiovisual communication technology and the disclosure of certain medical information by electronic transmission. I acknowledge that I have been given the opportunity to request an in-person assessment or other available alternative prior to the telemedicine visit and am voluntarily participating in the telemedicine visit.  I understand that I have the right to withhold or withdraw my consent to the use of telemedicine in the course of my care at any time, without affecting my right to future care or treatment, and that the Practitioner or I may terminate the telemedicine visit at any time. I understand that I have the right to inspect all information obtained and/or recorded in the course of the telemedicine visit and may receive copies of available information for a reasonable fee.  I understand that some of the potential risks of receiving the Services via telemedicine include:  Delay or interruption in medical evaluation due to technological equipment failure or disruption; Information transmitted may not be  sufficient (e.g. poor resolution of images) to allow for appropriate medical decision making by the Practitioner; and/or  In rare instances, security protocols could fail, causing a breach of personal health information.  Furthermore, I acknowledge that it is my responsibility to provide information about my medical history, conditions and care that is complete and accurate to the best of my ability. I acknowledge that Practitioner's advice, recommendations, and/or decision may be based on factors not within their control, such as incomplete or inaccurate data provided by me or distortions of diagnostic images or specimens that may result from electronic transmissions. I understand that the practice of medicine is not an exact science and that Practitioner makes no warranties or guarantees regarding treatment outcomes. I acknowledge that a copy of this consent can be made available to me via my patient portal [facility] MyChart), or I can request a printed copy by calling the office of [facility].    I understand that my insurance will be billed for this visit.   I have read or had this consent read to me. I understand the contents of this consent, which adequately explains the benefits and risks of the Services being provided via telemedicine.  I have been provided ample opportunity to ask questions regarding this consent and the Services and have had my questions answered to my satisfaction. I give my informed consent for the services to be provided through the use of telemedicine in my medical care

## 2023-11-26 NOTE — Telephone Encounter (Signed)
 1st attempt : Called patient, NA, left message on VM to contact our office to schedule a telehealth visit for cardiac clearance.

## 2023-11-26 NOTE — Telephone Encounter (Signed)
 Appointment scheduled for 12/02/2023 @ 3:20pm Med req and consent are complete. Call patient at [phone].

## 2023-11-26 NOTE — Telephone Encounter (Signed)
  Request for patient to stop medication prior to procedure or is needing cleareance  11/26/23  ARIANNY PUN 06-05-1940  What type of surgery is being performed? Colonoscopy/Esophagogastroduodenoscopy (EGD) with esophageal dilation  When is surgery scheduled? TBD  What type of clearance is required (medical or pharmacy to hold medication or both? MEDICAL  Patient is needing to have cardiac clearance prior to being scheduled for procedure.  Name of physician performing surgery?  Dr.Charles Cindie [facility] at [facility]: [phone], option 5 Fax: [phone]  Anesthesia type (none, local, MAC, general)? MAC   ? Yes ? No Patient can hold medication as requested   Signature: ___________________________

## 2023-11-27 ENCOUNTER — Ambulatory Visit: Admitting: Obstetrics & Gynecology

## 2023-11-27 VITALS — BP 118/72 | HR 87 | Ht 62.0 in | Wt 164.0 lb

## 2023-11-27 DIAGNOSIS — N3281 Overactive bladder: Secondary | ICD-10-CM

## 2023-11-27 DIAGNOSIS — N813 Complete uterovaginal prolapse: Secondary | ICD-10-CM

## 2023-11-27 DIAGNOSIS — Z4689 Encounter for fitting and adjustment of other specified devices: Secondary | ICD-10-CM

## 2023-11-27 NOTE — Progress Notes (Signed)
 Chief Complaint  Patient presents with   Pessary Check    Blood pressure 118/72, pulse 87, height 5' 2 (1.575 m), weight 164 lb (74.4 kg).  Barbara Lucero presents today for routine follow up related to her pessary.   She uses a Milex ring with support #5 She reports no vaginal discharge and no vaginal bleeding   Likert scale(1 not bothersome -5 very bothersome)  :  1  Exam reveals no undue vaginal mucosal pressure of breakdown, no discharge and no vaginal bleeding.  Vaginal Epithelial Abnormality Classification System:   0 0    No abnormalities 1    Epithelial erythema 2    Granulation tissue 3    Epithelial break or erosion, 1 cm or less 4    Epithelial break or erosion, 1 cm or greater  The pessary is removed, cleaned and replaced without difficulty.      ICD-10-CM   1. 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 OAB (overactive bladder): chronic + suboptimally managed  N32.81        Barbara Lucero will be sen back in 4 months for continued follow up.  Vonn VEAR Inch, MD  11/27/2023 11:45 AM

## 2023-11-30 DIAGNOSIS — M545 Low back pain, unspecified: Secondary | ICD-10-CM | POA: Diagnosis not present

## 2023-11-30 DIAGNOSIS — R112 Nausea with vomiting, unspecified: Secondary | ICD-10-CM | POA: Diagnosis not present

## 2023-11-30 DIAGNOSIS — N3000 Acute cystitis without hematuria: Secondary | ICD-10-CM | POA: Diagnosis not present

## 2023-11-30 DIAGNOSIS — R300 Dysuria: Secondary | ICD-10-CM | POA: Diagnosis not present

## 2023-12-02 ENCOUNTER — Ambulatory Visit: Attending: Cardiology

## 2023-12-02 DIAGNOSIS — Z01810 Encounter for preprocedural cardiovascular examination: Secondary | ICD-10-CM | POA: Diagnosis not present

## 2023-12-02 NOTE — Progress Notes (Signed)
 Virtual Visit via Telephone Note   Because of Barbara Lucero co-morbid illnesses, she is at least at moderate risk for complications without adequate follow up.  This format is felt to be most appropriate for this patient at this time.  Due to technical limitations with video connection (technology), today's appointment will be conducted as an audio only telehealth visit, and [name] verbally agreed to proceed in this manner.   All issues noted in this document were discussed and addressed.  No physical exam could be performed with this format.  Evaluation Performed:  Preoperative cardiovascular risk assessment _____________   Date:  12/02/2023   Patient ID:  Barbara Lucero, DOB [birth year], MRN [id number] Patient Location:  Home Provider location:   Office  Primary Care Provider:  Shona Norleen PEDLAR, MD Primary Cardiologist:  Maude Emmer, MD  Chief Complaint / Patient Profile   83 y.o. y/o female with a h/o hypertension, non-small cell carcinoma, transcatheter aortic valve replacement who is pending Colonoscopy/Esophagogastroduodenoscopy (EGD) with esophageal dilation  and presents today for telephonic preoperative cardiovascular risk assessment.  History of Present Illness    Barbara Lucero is a 83 y.o. female who presents via audio/video conferencing for a telehealth visit today.  Pt was last seen in cardiology clinic on 09/28/2023 by Izetta Hummer, PA-C.  At that time JIM PHILEMON was doing well .  The patient is now pending procedure as outlined above. Since her last visit, she continues to be stable from a cardiac standpoint.  Today she denies chest pain, shortness of breath, lower extremity edema, fatigue, palpitations, melena, hematuria, hemoptysis, diaphoresis, weakness, presyncope, syncope, orthopnea, and PND.   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 Verlin & Dr. Maryjane   Severe aortic stenosis    Varicose veins of bilateral lower extremities with other complications    Past Surgical History:  Procedure Laterality Date   BOWEL RESECTION  1970   BRONCHIAL BIOPSY  09/01/2022   Procedure: BRONCHIAL BIOPSIES;  Surgeon: Shelah Lamar RAMAN, MD;  Location: [facility] ENDOSCOPY;  Service: Pulmonary;;   BRONCHIAL BRUSHINGS  09/01/2022   Procedure: BRONCHIAL BRUSHINGS;  Surgeon: Shelah Lamar RAMAN, MD;  Location: [facility] ENDOSCOPY;  Service: Pulmonary;;   BRONCHIAL NEEDLE ASPIRATION BIOPSY  09/01/2022   Procedure: BRONCHIAL NEEDLE ASPIRATION BIOPSIES;  Surgeon: Shelah Lamar RAMAN, MD;  Location: [facility] ENDOSCOPY;  Service: Pulmonary;;   EYE SURGERY Bilateral    cataract   FIDUCIAL MARKER PLACEMENT  09/01/2022   Procedure: FIDUCIAL MARKER PLACEMENT;  Surgeon: Shelah Lamar RAMAN, MD;  Location: [facility] ENDOSCOPY;  Service: Pulmonary;;   HIP ARTHROPLASTY Left 06/06/2016   Procedure: ARTHROPLASTY BIPOLAR HIP (HEMIARTHROPLASTY);  Surgeon: Taft FORBES Minerva, MD;  Location: [facility] ORS;  Service: Orthopedics;  Laterality: Left;   INTRAOPERATIVE TRANSTHORACIC ECHOCARDIOGRAM N/A 10/07/2022   Procedure: INTRAOPERATIVE TRANSTHORACIC ECHOCARDIOGRAM;  Surgeon: Verlin Lonni BIRCH, MD;  Location: [facility] INVASIVE CV LAB;  Service: Open Heart Surgery;  Laterality: N/A;   RIGHT HEART CATH AND  CORONARY ANGIOGRAPHY N/A 07/07/2022   Procedure: RIGHT HEART CATH AND CORONARY ANGIOGRAPHY;  Surgeon: Verlin Lonni BIRCH, MD;  Location: [facility] INVASIVE CV LAB;  Service: Cardiovascular;  Laterality: N/A;   TRANSCATHETER AORTIC VALVE REPLACEMENT, TRANSFEMORAL Right 10/07/2022   Procedure: Transcatheter Aortic Valve Replacement,  Transfemoral;  Surgeon: Verlin Lonni BIRCH, MD;  Location: [facility] INVASIVE CV LAB;  Service: Open Heart Surgery;  Laterality: Right;    Allergies  Allergies  Allergen Reactions   Coconut (Cocos Nucifera) Hives   Latex Hives    Home Medications    Prior to Admission medications   Medication Sig Start Date End Date Taking? Authorizing Provider  acetaminophen  (TYLENOL ) 500 MG tablet Take 1,000 mg by mouth 2 (two) times daily.    [provider]  albuterol  (VENTOLIN  HFA) 108 (90 Base) MCG/ACT inhaler Inhale 2 puffs into the lungs every 4 (four) hours as needed for shortness of breath or wheezing. 10/01/23   Shelah Lamar RAMAN, MD  ALPRAZolam  (XANAX ) 0.25 MG tablet Take 0.25 mg by mouth at bedtime. 04/14/22   [provider]  Ascorbic Acid (VITAMIN C) 1000 MG tablet Take 1,000 mg by mouth daily.    [provider]  aspirin  EC 81 MG tablet Take 81 mg by mouth at bedtime. Swallow whole.    [provider]  b complex vitamins tablet Take 1 tablet by mouth daily.    [provider]  buPROPion (WELLBUTRIN) 75 MG tablet 75 mg 2 (two) times daily. 09/25/23   [provider]  Calcium Carb-Cholecalciferol (CALCIUM 600+D) 600-20 MG-MCG TABS Take 1 tablet by mouth daily.    [provider]  Cholecalciferol (VITAMIN D3) 50 MCG (2000 UT) capsule Take 2,000 Units by mouth daily.    [provider]  cyanocobalamin  (VITAMIN B12) 1000 MCG tablet Take 1,000 mcg by mouth daily as needed (energy).    [provider]  ferrous sulfate 325 (65 FE) MG tablet Take 325 mg by mouth daily with breakfast.    [provider]  furosemide  (LASIX ) 20 MG tablet Take 1 tablet (20 mg total) by mouth as needed. 09/28/23   Sebastian Lamarr SAUNDERS, PA-C  gabapentin  (NEURONTIN ) 100 MG capsule Take 1 capsule (100 mg total) by mouth 2 (two) times daily. 09/01/22   Byrum, Robert S, MD  hydrocortisone cream 1 % Apply 1 Application topically 2 (two) times daily as  needed (rash).    [provider]  Multiple Vitamins-Minerals (HAIR SKIN & NAILS) TABS Take 3 tablets by mouth daily.    [provider]  Multiple Vitamins-Minerals (MULTIVITAMIN WITH MINERALS) tablet Take 1 tablet by mouth daily.    [provider]  MYRBETRIQ  50 MG TB24 tablet TAKE (1) TABLET BY MOUTH AT BEDTIME. 06/18/23   Jayne Vonn DEL, MD  olmesartan (BENICAR) 40 MG tablet Take 40 mg by mouth daily. 10/30/21   [provider]  pantoprazole  (PROTONIX ) 40 MG tablet Take 40 mg by mouth daily. 07/27/19   [provider]  Psyllium (METAMUCIL PO) Take 1 Dose by mouth daily as needed (constipation). 1 dose = 1 teaspoonful    [provider]  sertraline  (ZOLOFT ) 100 MG tablet Take 100 mg by mouth at bedtime. 11/13/21   [provider]  simvastatin  (ZOCOR ) 10 MG tablet Take 10 mg by mouth at bedtime. 08/30/19   [provider]  solifenacin  (VESICARE ) 10 MG tablet TAKE (1) TABLET BY MOUTH AT BEDTIME. 10/21/23   Jayne Vonn DEL, MD  sulfaSALAzine  (AZULFIDINE ) 500 MG tablet  Take 1 tablet (500 mg total) by mouth 2 (two) times daily. 11/25/21   Ricky Fines, MD  traMADol  (ULTRAM ) 50 MG tablet as needed for moderate pain (pain score 4-6).    [provider]  vitamin E 400 UNIT capsule Take 400 Units by mouth daily.    [provider]    Physical Exam    Vital Signs:  VALEDA CORZINE does not have vital signs available for review today.  Given telephonic nature of communication, physical exam is limited. AAOx3. NAD. Normal affect.  Speech and respirations are unlabored.  Accessory Clinical Findings    None  Assessment & Plan    1.  Preoperative Cardiovascular Risk Assessment:Colonoscopy/Esophagogastroduodenoscopy (EGD) with esophageal dilation, TBD, Dr. Carlin Hasty, [facility] gastroenterology at [facility], fax #[phone]      Primary Cardiologist: Maude Emmer, MD  Chart reviewed as part of pre-operative  protocol coverage. Given past medical history and time since last visit, based on ACC/AHA guidelines, PEGAH SEGEL would be at acceptable risk for the planned procedure without further cardiovascular testing.   Patient was advised that if she develops new symptoms prior to surgery to contact our office to arrange a follow-up appointment.  She verbalized understanding.  I will route this recommendation to the requesting party via Epic fax function and remove from pre-op pool.       Time:   Today, I have spent  5 minutes with the patient with telehealth technology discussing medical history, symptoms, and management plan.  I spent 10 minutes reviewing patient's past cardiac history and cardiac medications.    Josefa CHRISTELLA Beauvais, NP  12/02/2023, 8:44 AM

## 2023-12-08 ENCOUNTER — Other Ambulatory Visit: Payer: Self-pay

## 2023-12-08 ENCOUNTER — Encounter (HOSPITAL_COMMUNITY): Payer: Self-pay

## 2023-12-08 ENCOUNTER — Emergency Department (HOSPITAL_COMMUNITY)
Admission: EM | Admit: 2023-12-08 | Discharge: 2023-12-08 | Discharge status: Against Medical Advice | Attending: Emergency Medicine | Admitting: Emergency Medicine

## 2023-12-08 DIAGNOSIS — S81832A Puncture wound without foreign body, left lower leg, initial encounter: Secondary | ICD-10-CM | POA: Diagnosis not present

## 2023-12-08 DIAGNOSIS — Z5321 Procedure and treatment not carried out due to patient leaving prior to being seen by health care provider: Secondary | ICD-10-CM | POA: Diagnosis not present

## 2023-12-08 DIAGNOSIS — W050XXA Fall from non-moving wheelchair, initial encounter: Secondary | ICD-10-CM | POA: Diagnosis not present

## 2023-12-08 DIAGNOSIS — N1831 Chronic kidney disease, stage 3a: Secondary | ICD-10-CM | POA: Diagnosis not present

## 2023-12-08 DIAGNOSIS — Y92512 Supermarket, store or market as the place of occurrence of the external cause: Secondary | ICD-10-CM | POA: Diagnosis not present

## 2023-12-08 DIAGNOSIS — I10 Essential (primary) hypertension: Secondary | ICD-10-CM | POA: Diagnosis not present

## 2023-12-08 DIAGNOSIS — R809 Proteinuria, unspecified: Secondary | ICD-10-CM | POA: Diagnosis not present

## 2023-12-08 NOTE — ED Triage Notes (Signed)
 Pt to er, pt states that she was at [employer] and went to get into the motorized wheel chair and hit her leg, states that she has a laceration and it is bleeding.  No bleeding noted at this time, pt has some dried blood on her pants, pt has a puncture like wound on her L shin.

## 2023-12-09 ENCOUNTER — Encounter: Payer: Self-pay | Admitting: *Deleted

## 2023-12-09 ENCOUNTER — Other Ambulatory Visit: Payer: Self-pay | Admitting: *Deleted

## 2023-12-09 MED ORDER — PEG 3350-KCL-NA BICARB-NACL 420 G PO SOLR: 4000.0000 mL | Freq: Once | ORAL | 0 refills | Status: AC

## 2023-12-09 NOTE — Telephone Encounter (Signed)
 Pt has been scheduled for 12/21/23. Instructions mailed to pt and informed pt that if she does not receive instructions by 12/17/23, she will need to come by office to pick them up. Pt informed to hold iron x 7 days prior to procedure. Prep sent to pharmacy.

## 2023-12-09 NOTE — Telephone Encounter (Signed)
 Per Dr.Carver: okay to schedule for EGD and CLN ASA 3 thank you

## 2023-12-17 ENCOUNTER — Other Ambulatory Visit: Payer: Self-pay

## 2023-12-17 ENCOUNTER — Encounter (HOSPITAL_COMMUNITY): Payer: Self-pay

## 2023-12-17 ENCOUNTER — Encounter (HOSPITAL_COMMUNITY)
Admission: RE | Admit: 2023-12-17 | Discharge: 2023-12-17 | Disposition: A | Discharge status: Home / Self Care | Source: Ambulatory Visit | Attending: Internal Medicine | Admitting: Internal Medicine

## 2023-12-18 DIAGNOSIS — I10 Essential (primary) hypertension: Secondary | ICD-10-CM | POA: Diagnosis not present

## 2023-12-18 DIAGNOSIS — E559 Vitamin D deficiency, unspecified: Secondary | ICD-10-CM | POA: Diagnosis not present

## 2023-12-18 DIAGNOSIS — E782 Mixed hyperlipidemia: Secondary | ICD-10-CM | POA: Diagnosis not present

## 2023-12-18 DIAGNOSIS — N3281 Overactive bladder: Secondary | ICD-10-CM | POA: Diagnosis not present

## 2023-12-18 DIAGNOSIS — I700 Atherosclerosis of aorta: Secondary | ICD-10-CM | POA: Diagnosis not present

## 2023-12-18 DIAGNOSIS — K5080 Crohn's disease of both small and large intestine without complications: Secondary | ICD-10-CM | POA: Diagnosis not present

## 2023-12-18 DIAGNOSIS — G629 Polyneuropathy, unspecified: Secondary | ICD-10-CM | POA: Diagnosis not present

## 2023-12-18 DIAGNOSIS — C3411 Malignant neoplasm of upper lobe, right bronchus or lung: Secondary | ICD-10-CM | POA: Diagnosis not present

## 2023-12-18 DIAGNOSIS — N1831 Chronic kidney disease, stage 3a: Secondary | ICD-10-CM | POA: Diagnosis not present

## 2023-12-18 DIAGNOSIS — G894 Chronic pain syndrome: Secondary | ICD-10-CM | POA: Diagnosis not present

## 2023-12-18 DIAGNOSIS — K219 Gastro-esophageal reflux disease without esophagitis: Secondary | ICD-10-CM | POA: Diagnosis not present

## 2023-12-21 ENCOUNTER — Ambulatory Visit (HOSPITAL_COMMUNITY)
Admission: RE | Admit: 2023-12-21 | Discharge: 2023-12-21 | Disposition: A | Discharge status: Home / Self Care | Attending: Internal Medicine | Admitting: Internal Medicine

## 2023-12-21 ENCOUNTER — Ambulatory Visit (HOSPITAL_COMMUNITY)

## 2023-12-21 ENCOUNTER — Other Ambulatory Visit: Payer: Self-pay

## 2023-12-21 ENCOUNTER — Encounter (HOSPITAL_COMMUNITY)
Admission: RE | Disposition: A | Discharge status: Home / Self Care | Payer: Self-pay | Source: Home / Self Care | Attending: Internal Medicine

## 2023-12-21 ENCOUNTER — Encounter (HOSPITAL_COMMUNITY): Payer: Self-pay | Admitting: Internal Medicine

## 2023-12-21 DIAGNOSIS — K529 Noninfective gastroenteritis and colitis, unspecified: Secondary | ICD-10-CM | POA: Diagnosis not present

## 2023-12-21 DIAGNOSIS — D122 Benign neoplasm of ascending colon: Secondary | ICD-10-CM | POA: Diagnosis not present

## 2023-12-21 DIAGNOSIS — K635 Polyp of colon: Secondary | ICD-10-CM | POA: Diagnosis not present

## 2023-12-21 DIAGNOSIS — Z87891 Personal history of nicotine dependence: Secondary | ICD-10-CM | POA: Insufficient documentation

## 2023-12-21 DIAGNOSIS — K5730 Diverticulosis of large intestine without perforation or abscess without bleeding: Secondary | ICD-10-CM | POA: Diagnosis not present

## 2023-12-21 DIAGNOSIS — Z09 Encounter for follow-up examination after completed treatment for conditions other than malignant neoplasm: Secondary | ICD-10-CM | POA: Diagnosis present

## 2023-12-21 DIAGNOSIS — R1310 Dysphagia, unspecified: Secondary | ICD-10-CM

## 2023-12-21 DIAGNOSIS — K50911 Crohn's disease, unspecified, with rectal bleeding: Secondary | ICD-10-CM | POA: Diagnosis not present

## 2023-12-21 DIAGNOSIS — K5090 Crohn's disease, unspecified, without complications: Secondary | ICD-10-CM

## 2023-12-21 DIAGNOSIS — K2970 Gastritis, unspecified, without bleeding: Secondary | ICD-10-CM | POA: Diagnosis not present

## 2023-12-21 DIAGNOSIS — D509 Iron deficiency anemia, unspecified: Secondary | ICD-10-CM | POA: Diagnosis not present

## 2023-12-21 DIAGNOSIS — Z59869 Financial insecurity, unspecified: Secondary | ICD-10-CM | POA: Diagnosis not present

## 2023-12-21 DIAGNOSIS — K449 Diaphragmatic hernia without obstruction or gangrene: Secondary | ICD-10-CM | POA: Insufficient documentation

## 2023-12-21 DIAGNOSIS — Q396 Congenital diverticulum of esophagus: Secondary | ICD-10-CM | POA: Insufficient documentation

## 2023-12-21 DIAGNOSIS — I10 Essential (primary) hypertension: Secondary | ICD-10-CM

## 2023-12-21 DIAGNOSIS — Z79899 Other long term (current) drug therapy: Secondary | ICD-10-CM | POA: Diagnosis not present

## 2023-12-21 HISTORY — PX: COLONOSCOPY: SHX5424

## 2023-12-21 HISTORY — PX: ESOPHAGEAL DILATION: SHX303

## 2023-12-21 HISTORY — PX: ESOPHAGOGASTRODUODENOSCOPY: SHX5428

## 2023-12-21 SURGERY — COLONOSCOPY
Anesthesia: General

## 2023-12-21 MED ORDER — PROPOFOL 10 MG/ML IV BOLUS
INTRAVENOUS | Status: DC | PRN
  Administered 2023-12-21: 100 ug/kg/min via INTRAVENOUS
  Administered 2023-12-21: 40 mg via INTRAVENOUS

## 2023-12-21 MED ORDER — LACTATED RINGERS IV SOLN: INTRAVENOUS | Status: DC

## 2023-12-21 MED ORDER — STERILE WATER FOR IRRIGATION IR SOLN
Status: DC | PRN
  Administered 2023-12-21: 50 mL

## 2023-12-21 NOTE — Op Note (Signed)
 [facility] Patient Name: Barbara Lucero Procedure Date: 12/21/2023 11:53 AM MRN: [id number] Date of Birth: October 21, 1940 Attending MD: Carlin POUR. Cindie , [place], [id number] CSN: [id number] Age: 83 Admit Type: Outpatient Procedure:                Colonoscopy Indications:              Follow-up of Crohn's disease Providers:                Carlin POUR. Cindie, DO, Devere Lodge, Jon FELIX                            Gerome RN, RN Referring MD:              Medicines:                See the Anesthesia note for documentation of the                            administered medications Complications:            No immediate complications. Estimated Blood Loss:     Estimated blood loss was minimal. Procedure:                Pre-Anesthesia Assessment:                           - The anesthesia plan was to use monitored                            anesthesia care (MAC).                           After obtaining informed consent, the colonoscope                            was passed under direct vision. Throughout the                            procedure, the patient's blood pressure, pulse, and                            oxygen  saturations were monitored continuously. The                            [id number]-[id number] ([id number]) Peds Colon was introduced                            through the anus and advanced to the the terminal                            ileum, with identification of the appendiceal                            orifice and IC valve. The colonoscopy was performed                            without difficulty. The patient  tolerated the                            procedure well. The quality of the bowel                            preparation was evaluated using the BBPS [facility]                            Bowel Preparation Scale) with scores of: Right                            Colon = 2 (minor amount of residual staining, small                            fragments of stool and/or opaque liquid, but  mucosa                            seen well), Transverse Colon = 2 (minor amount of                            residual staining, small fragments of stool and/or                            opaque liquid, but mucosa seen well) and Left Colon                            = 2 (minor amount of residual staining, small                            fragments of stool and/or opaque liquid, but mucosa                            seen well). The total BBPS score equals 6. The                            quality of the bowel preparation was good. Scope In: 12:16:31 PM Scope Out: 12:34:05 PM Scope Withdrawal Time: 0 hours 16 minutes 11 seconds  Total Procedure Duration: 0 hours 17 minutes 34 seconds  Findings:      Scattered medium-mouthed and small-mouthed diverticula were found in the       sigmoid colon, descending colon and transverse colon.      The terminal ileum appeared normal. Biopsies were taken with a cold       forceps for histology.      Two sessile polyps were found in the ascending colon. The polyps were 8       to 12 mm in size. These polyps were removed with a cold snare. Resection       and retrieval were complete.      Two sessile polyps were found in the transverse colon. The polyps were 4       to 8 mm in size. These polyps were removed with a cold snare. Resection       and retrieval were complete.  Biopsies were taken with a cold forceps in the entire colon for       histology. Impression:               - Diverticulosis in the sigmoid colon, in the                            descending colon and in the transverse colon.                           - The examined portion of the ileum was normal.                            Biopsied.                           - Two 8 to 12 mm polyps in the ascending colon,                            removed with a cold snare. Resected and retrieved.                           - Two 4 to 8 mm polyps in the transverse colon,                             removed with a cold snare. Resected and retrieved.                           - Biopsies were taken with a cold forceps for                            histology in the entire colon. Moderate Sedation:      Per Anesthesia Care Recommendation:           - Patient has a contact number available for                            emergencies. The signs and symptoms of potential                            delayed complications were discussed with the                            patient. Return to normal activities tomorrow.                            Written discharge instructions were provided to the                            patient.                           - Resume previous diet.                           - Continue  present medications.                           - Await pathology results.                           - Repeat colonoscopy in 5 years for surveillance                            based on pathology results.                           - Return to GI clinic in 3 months. Procedure Code(s):        --- Professional ---                           [phone], Colonoscopy, flexible; with removal of                            tumor(s), polyp(s), or other lesion(s) by snare                            technique                           45380, 59, Colonoscopy, flexible; with biopsy,                            single or multiple Diagnosis Code(s):        --- Professional ---                           D12.2, Benign neoplasm of ascending colon                           D12.3, Benign neoplasm of transverse colon (hepatic                            flexure or splenic flexure)                           K50.90, Crohn's disease, unspecified, without                            complications                           K57.30, Diverticulosis of large intestine without                            perforation or abscess without bleeding CPT copyright 2022 American Medical Association. All rights reserved. The  codes documented in this report are preliminary and upon coder review may  be revised to meet current compliance requirements. Carlin POUR. Cindie, DO Carlin POUR. Cindie, DO 12/21/2023 12:37:37 PM This report has been signed electronically. Number of Addenda: 0

## 2023-12-21 NOTE — Discharge Instructions (Addendum)
 EGD Discharge instructions Please read the instructions outlined below and refer to this sheet in the next few weeks. These discharge instructions provide you with general information on caring for yourself after you leave the hospital. Your doctor may also give you specific instructions. While your treatment has been planned according to the most current medical practices available, unavoidable complications occasionally occur. If you have any problems or questions after discharge, please call your doctor. ACTIVITY You may resume your regular activity but move at a slower pace for the next 24 hours.  Take frequent rest periods for the next 24 hours.  Walking will help expel (get rid of) the air and reduce the bloated feeling in your abdomen.  No driving for 24 hours (because of the anesthesia (medicine) used during the test).  You may shower.  Do not sign any important legal documents or operate any machinery for 24 hours (because of the anesthesia used during the test).  NUTRITION Drink plenty of fluids.  You may resume your normal diet.  Begin with a light meal and progress to your normal diet.  Avoid alcoholic beverages for 24 hours or as instructed by your caregiver.  MEDICATIONS You may resume your normal medications unless your caregiver tells you otherwise.  WHAT YOU CAN EXPECT TODAY You may experience abdominal discomfort such as a feeling of fullness or "gas" pains.  FOLLOW-UP Your doctor will discuss the results of your test with you.  SEEK IMMEDIATE MEDICAL ATTENTION IF ANY OF THE FOLLOWING OCCUR: Excessive nausea (feeling sick to your stomach) and/or vomiting.  Severe abdominal pain and distention (swelling).  Trouble swallowing.  Temperature over 101 F (37.8 C).  Rectal bleeding or vomiting of blood.      Colonoscopy Discharge Instructions  Read the instructions outlined below and refer to this sheet in the next few weeks. These discharge instructions provide you  with general information on caring for yourself after you leave the hospital. Your doctor may also give you specific instructions. While your treatment has been planned according to the most current medical practices available, unavoidable complications occasionally occur.   ACTIVITY You may resume your regular activity, but move at a slower pace for the next 24 hours.  Take frequent rest periods for the next 24 hours.  Walking will help get rid of the air and reduce the bloated feeling in your belly (abdomen).  No driving for 24 hours (because of the medicine (anesthesia) used during the test).   Do not sign any important legal documents or operate any machinery for 24 hours (because of the anesthesia used during the test).  NUTRITION Drink plenty of fluids.  You may resume your normal diet as instructed by your doctor.  Begin with a light meal and progress to your normal diet. Heavy or fried foods are harder to digest and may make you feel sick to your stomach (nauseated).  Avoid alcoholic beverages for 24 hours or as instructed.  MEDICATIONS You may resume your normal medications unless your doctor tells you otherwise.  WHAT YOU CAN EXPECT TODAY Some feelings of bloating in the abdomen.  Passage of more gas than usual.  Spotting of blood in your stool or on the toilet paper.  IF YOU HAD POLYPS REMOVED DURING THE COLONOSCOPY: No aspirin  products for 7 days or as instructed.  No alcohol for 7 days or as instructed.  Eat a soft diet for the next 24 hours.  FINDING OUT THE RESULTS OF YOUR TEST Not all test results  are available during your visit. If your test results are not back during the visit, make an appointment with your caregiver to find out the results. Do not assume everything is normal if you have not heard from your caregiver or the medical facility. It is important for you to follow up on all of your test results.  SEEK IMMEDIATE MEDICAL ATTENTION IF: You have more than a  spotting of blood in your stool.  Your belly is swollen (abdominal distention).  You are nauseated or vomiting.  You have a temperature over 101.  You have abdominal pain or discomfort that is severe or gets worse throughout the day.   Your EGD revealed mild amount inflammation in your stomach.  I took biopsies of this to rule out infection with a bacteria called H. pylori.  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  No tightenings or strictures.  I did not need to dilate today.  Small bowel was normal.  Continue on pantoprazole  daily.  Your colonoscopy revealed 4 polyp(s) which I removed successfully.  Overall, your colon appeared very healthy.  I did not see any active inflammation throughout your colon or end portion of your small bowel.  I took biopsies of your colon to further evaluate.    Await pathology results, my office will contact you.  You also have diverticulosis. I would recommend increasing fiber in your diet or adding OTC Benefiber/Metamucil. Be sure to drink at least 4 to 6 glasses of water daily.   Follow-up with GI in 3 months  I hope you have a great rest of your week!  Barbara Lucero. Cindie, D.O. Gastroenterology and Hepatology [facility]

## 2023-12-21 NOTE — Anesthesia Preprocedure Evaluation (Addendum)
 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%   Neuro/Psych  PSYCHIATRIC DISORDERS Anxiety Depression    negative neurological ROS     GI/Hepatic Neg liver ROS,GERD  ,,  Endo/Other  negative endocrine ROS    Renal/GU Renal disease  negative genitourinary   Musculoskeletal  (+) Arthritis ,    Abdominal   Peds negative pediatric ROS (+)  Hematology negative hematology ROS (+)   Anesthesia Other Findings   Reproductive/Obstetrics negative OB ROS                              Anesthesia Physical Anesthesia Plan  ASA: 3  Anesthesia Plan: General   Post-op Pain Management:    Induction: Intravenous  PONV Risk Score and Plan:   Airway Management Planned: Nasal Cannula  Additional Equipment:   Intra-op Plan:   Post-operative Plan:   Informed Consent: I have reviewed the patients History and Physical, chart, labs and discussed the procedure including the risks, benefits and alternatives for the proposed anesthesia with the patient or authorized representative who has indicated his/her understanding and acceptance.     Dental advisory given  Plan Discussed with: CRNA  Anesthesia Plan Comments:          Anesthesia Quick Evaluation

## 2023-12-21 NOTE — Anesthesia Postprocedure Evaluation (Signed)
 Anesthesia Post Note  Patient: IZUMI MIXON  Procedure(s) Performed: COLONOSCOPY EGD (ESOPHAGOGASTRODUODENOSCOPY) DILATION, ESOPHAGUS  Patient location during evaluation: PACU Anesthesia Type: General Level of consciousness: awake and alert Pain management: pain level controlled Vital Signs Assessment: post-procedure vital signs reviewed and stable Respiratory status: spontaneous breathing, nonlabored ventilation, respiratory function stable and patient connected to nasal cannula oxygen  Cardiovascular status: blood pressure returned to baseline and stable Postop Assessment: no apparent nausea or vomiting Anesthetic complications: no   No notable events documented.   Last Vitals:  Vitals:   12/21/23 1126 12/21/23 1240  BP: (!) 174/65 (!) 129/48  Pulse: 79 72  Resp: 17 20  Temp: 36.6 C 36.7 C  SpO2: 95% 98%    Last Pain:  Vitals:   12/21/23 1240  TempSrc: Oral  PainSc: 0-No pain                 Andrea Limes

## 2023-12-21 NOTE — Interval H&P Note (Signed)
 History and Physical Interval Note:  12/21/2023 11:31 AM  Barbara Lucero  has presented today for surgery, with the diagnosis of IDA, heme positive stool,dysphagia,Crohns.  The various methods of treatment have been discussed with the patient and family. After consideration of risks, benefits and other options for treatment, the patient has consented to  Procedure(s) with comments: COLONOSCOPY (N/A) - 1:00 pm, asa 3 EGD (ESOPHAGOGASTRODUODENOSCOPY) (N/A) DILATION, ESOPHAGUS (N/A) as a surgical intervention.  The patient's history has been reviewed, patient examined, no change in status, stable for surgery.  I have reviewed the patient's chart and labs.  Questions were answered to the patient's satisfaction.     Barbara Lucero

## 2023-12-21 NOTE — Transfer of Care (Signed)
 Immediate Anesthesia Transfer of Care Note  Patient: Barbara Lucero  Procedure(s) Performed: COLONOSCOPY EGD (ESOPHAGOGASTRODUODENOSCOPY) DILATION, ESOPHAGUS  Patient Location: Short Stay  Anesthesia Type:General  Level of Consciousness: awake, alert , oriented, and patient cooperative  Airway & Oxygen  Therapy: Patient Spontanous Breathing  Post-op Assessment: Report given to RN, Post -op Vital signs reviewed and stable, and Patient moving all extremities X 4  Post vital signs: Reviewed and stable  Last Vitals:  Vitals Value Taken Time  BP 129/48 12/21/23 12:40  Temp 36.7 C 12/21/23 12:40  Pulse 72 12/21/23 12:40  Resp 20 12/21/23 12:40  SpO2 98 % 12/21/23 12:40    Last Pain:  Vitals:   12/21/23 1240  TempSrc: Oral  PainSc: 0-No pain      Patients Stated Pain Goal: 9 (12/21/23 1126)  Complications: No notable events documented.

## 2023-12-21 NOTE — Op Note (Signed)
 [facility] Patient Name: Barbara Lucero Procedure Date: 12/21/2023 11:51 AM MRN: [id number] Date of Birth: [birth year] Attending MD: Carlin POUR. Cindie , [place], [id number] CSN: [id number] Age: 83 Admit Type: Outpatient Procedure:                Upper GI endoscopy Indications:              Dysphagia, Heartburn Providers:                Carlin POUR. Cindie, DO, Jon LABOR. Gerome RN, RN,                            Devere Lodge Referring MD:              Medicines:                See the Anesthesia note for documentation of the                            administered medications Complications:            No immediate complications. Estimated Blood Loss:     Estimated blood loss was minimal. Procedure:                Pre-Anesthesia Assessment:                           - The anesthesia plan was to use monitored                            anesthesia care (MAC).                           After obtaining informed consent, the endoscope was                            passed under direct vision. Throughout the                            procedure, the patient's blood pressure, pulse, and                            oxygen  saturations were monitored continuously. The                            [id number] ([id number]) Upper was introduced through                            the mouth, and advanced to the second part of                            duodenum. The upper GI endoscopy was accomplished                            without difficulty. The patient tolerated the                            procedure well. Scope In: 12:07:45  PM Scope Out: 12:12:08 PM Total Procedure Duration: 0 hours 4 minutes 23 seconds  Findings:      A non-bleeding diverticulum with a small opening and no stigmata of       recent bleeding was found in the lower third of the esophagus at 31 cm.      The Z-line was regular and was found 36 cm from the incisors.      A small hiatal hernia was present.      Patchy inflammation was  found in the entire examined stomach. Biopsies       were taken with a cold forceps for Helicobacter pylori testing.      The duodenal bulb, first portion of the duodenum and second portion of       the duodenum were normal.      There is no endoscopic evidence of stenosis or stricture in the entire       esophagus. Impression:               - Diverticulum in the lower third of the esophagus.                           - Z-line regular, 36 cm from the incisors.                           - Small hiatal hernia.                           - Gastritis. Biopsied.                           - Normal duodenal bulb, first portion of the                            duodenum and second portion of the duodenum. Moderate Sedation:      Per Anesthesia Care Recommendation:           - Patient has a contact number available for                            emergencies. The signs and symptoms of potential                            delayed complications were discussed with the                            patient. Return to normal activities tomorrow.                            Written discharge instructions were provided to the                            patient.                           - Resume previous diet.                           - Continue present medications.                           -  Await pathology results.                           - Use Protonix  (pantoprazole ) 40 mg PO daily.                           - Return to GI clinic in 3 months. Procedure Code(s):        --- Professional ---                           [id number], Esophagogastroduodenoscopy, flexible,                            transoral; with biopsy, single or multiple Diagnosis Code(s):        --- Professional ---                           Q39.6, Congenital diverticulum of esophagus                           K44.9, Diaphragmatic hernia without obstruction or                            gangrene                           K29.70, Gastritis,  unspecified, without bleeding                           R13.10, Dysphagia, unspecified                           R12, Heartburn CPT copyright 2022 [employer]. All rights reserved. The codes documented in this report are preliminary and upon coder review may  be revised to meet current compliance requirements. Carlin POUR. Cindie, DO Carlin POUR. Cindie, DO 12/21/2023 12:15:22 PM This report has been signed electronically. Number of Addenda: 0

## 2023-12-22 ENCOUNTER — Encounter (HOSPITAL_COMMUNITY): Payer: Self-pay | Admitting: Internal Medicine

## 2023-12-22 LAB — SURGICAL PATHOLOGY

## 2024-01-01 ENCOUNTER — Inpatient Hospital Stay: Attending: Internal Medicine

## 2024-01-01 ENCOUNTER — Ambulatory Visit (HOSPITAL_COMMUNITY)
Admission: RE | Admit: 2024-01-01 | Discharge: 2024-01-01 | Disposition: A | Discharge status: Home / Self Care | Source: Ambulatory Visit | Attending: Physician Assistant | Admitting: Physician Assistant

## 2024-01-01 DIAGNOSIS — Y842 Radiological procedure and radiotherapy as the cause of abnormal reaction of the patient, or of later complication, without mention of misadventure at the time of the procedure: Secondary | ICD-10-CM | POA: Diagnosis not present

## 2024-01-01 DIAGNOSIS — Z91040 Latex allergy status: Secondary | ICD-10-CM | POA: Diagnosis not present

## 2024-01-01 DIAGNOSIS — Z79899 Other long term (current) drug therapy: Secondary | ICD-10-CM | POA: Insufficient documentation

## 2024-01-01 DIAGNOSIS — Z923 Personal history of irradiation: Secondary | ICD-10-CM | POA: Insufficient documentation

## 2024-01-01 DIAGNOSIS — Z7982 Long term (current) use of aspirin: Secondary | ICD-10-CM | POA: Insufficient documentation

## 2024-01-01 DIAGNOSIS — Z86718 Personal history of other venous thrombosis and embolism: Secondary | ICD-10-CM | POA: Diagnosis not present

## 2024-01-01 DIAGNOSIS — Z91018 Allergy to other foods: Secondary | ICD-10-CM | POA: Insufficient documentation

## 2024-01-01 DIAGNOSIS — M549 Dorsalgia, unspecified: Secondary | ICD-10-CM | POA: Insufficient documentation

## 2024-01-01 DIAGNOSIS — M170 Bilateral primary osteoarthritis of knee: Secondary | ICD-10-CM | POA: Diagnosis not present

## 2024-01-01 DIAGNOSIS — C3411 Malignant neoplasm of upper lobe, right bronchus or lung: Secondary | ICD-10-CM | POA: Diagnosis present

## 2024-01-01 DIAGNOSIS — I10 Essential (primary) hypertension: Secondary | ICD-10-CM | POA: Diagnosis not present

## 2024-01-01 LAB — CMP (CANCER CENTER ONLY)
ALT: 7 U/L (ref 0–44)
AST: 13 U/L — ABNORMAL LOW (ref 15–41)
Albumin: 4.1 g/dL (ref 3.5–5.0)
Alkaline Phosphatase: 62 U/L (ref 38–126)
Anion gap: 4 — ABNORMAL LOW (ref 5–15)
BUN: 33 mg/dL — ABNORMAL HIGH (ref 8–23)
CO2: 31 mmol/L (ref 22–32)
Calcium: 9.1 mg/dL (ref 8.9–10.3)
Chloride: 103 mmol/L (ref 98–111)
Creatinine: 1.08 mg/dL — ABNORMAL HIGH (ref 0.44–1.00)
GFR, Estimated: 52 mL/min — ABNORMAL LOW (ref >60)
Glucose, Bld: 87 mg/dL (ref 70–99)
Potassium: 4.6 mmol/L (ref 3.5–5.1)
Sodium: 138 mmol/L (ref 135–145)
Total Bilirubin: 0.4 mg/dL (ref 0.0–1.2)
Total Protein: 7.2 g/dL (ref 6.5–8.1)

## 2024-01-01 LAB — CBC WITH DIFFERENTIAL (CANCER CENTER ONLY)
Abs Immature Granulocytes: 0.04 K/uL (ref 0.00–0.07)
Basophils Absolute: 0.1 K/uL (ref 0.0–0.1)
Basophils Relative: 1 %
Eosinophils Absolute: 0.2 K/uL (ref 0.0–0.5)
Eosinophils Relative: 2 %
HCT: 33.8 % — ABNORMAL LOW (ref 36.0–46.0)
Hemoglobin: 10.8 g/dL — ABNORMAL LOW (ref 12.0–15.0)
Immature Granulocytes: 1 %
Lymphocytes Relative: 17 %
Lymphs Abs: 1.5 K/uL (ref 0.7–4.0)
MCH: 29.5 pg (ref 26.0–34.0)
MCHC: 32 g/dL (ref 30.0–36.0)
MCV: 92.3 fL (ref 80.0–100.0)
Monocytes Absolute: 0.9 K/uL (ref 0.1–1.0)
Monocytes Relative: 11 %
Neutro Abs: 6 K/uL (ref 1.7–7.7)
Neutrophils Relative %: 68 %
Platelet Count: 145 K/uL — ABNORMAL LOW (ref 150–400)
RBC: 3.66 MIL/uL — ABNORMAL LOW (ref 3.87–5.11)
RDW: 13.1 % (ref 11.5–15.5)
WBC Count: 8.7 K/uL (ref 4.0–10.5)
nRBC: 0 % (ref 0.0–0.2)

## 2024-01-01 MED ORDER — IOHEXOL 300 MG/ML  SOLN
100.0000 mL | Freq: Once | INTRAMUSCULAR | Status: AC | PRN
  Administered 2024-01-01: 75 mL via INTRAVENOUS

## 2024-01-07 ENCOUNTER — Inpatient Hospital Stay: Admitting: Internal Medicine

## 2024-01-07 VITALS — BP 119/54 | HR 74 | Temp 97.6°F | Resp 17 | Ht 62.0 in | Wt 165.0 lb

## 2024-01-07 DIAGNOSIS — C3411 Malignant neoplasm of upper lobe, right bronchus or lung: Secondary | ICD-10-CM | POA: Diagnosis not present

## 2024-01-07 DIAGNOSIS — C3490 Malignant neoplasm of unspecified part of unspecified bronchus or lung: Secondary | ICD-10-CM | POA: Diagnosis not present

## 2024-01-07 NOTE — Progress Notes (Signed)
 [facility] [phone]   [phone]  OFFICE PROGRESS NOTE  Barbara Norleen PEDLAR, MD [address] [place] [address]  DIAGNOSIS: highly suspicious stage Ia (T1b, N0, M0) non-small cell lung cancer presented with right upper lobe lung nodule. The biopsy was consistent with atypical cells but not diagnostic for malignancy but her PET scan showed significant hypermetabolic activity in this nodule consistent with lung malignancy.   PRIOR THERAPY: Curative SBRT under the care of Dr. Dewey in January 2025  CURRENT THERAPY: Observation.  INTERVAL HISTORY: Barbara Lucero 83 y.o. female returns to the clinic today for follow-up visit accompanied by her daughter.Discussed the use of AI scribe software for clinical note transcription with the patient, who gave verbal consent to proceed.  History of Present Illness Barbara Lucero is an 83 year old female with stage one non-small cell lung cancer who presents for evaluation and repeat CT scan for restaging of her disease. She is accompanied by her daughter.  She was diagnosed with stage one non-small cell lung cancer in July 2024 and completed curative stereotactic body radiation therapy in January 2025. She is currently under observation and is here for a repeat CT scan of the chest to restage her disease. No chest pain or respiratory symptoms.  She experiences significant discomfort due to arthritis, particularly in her knees and legs, as well as back pain. She previously received injections for her arthritis, which did not alleviate her symptoms and resulted in a large bill.      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 Verlin & Dr. Maryjane   Severe aortic stenosis    Varicose veins of bilateral lower extremities with other complications     ALLERGIES:  is allergic to coconut (cocos nucifera) and latex.  MEDICATIONS:  Current Outpatient Medications  Medication Sig Dispense Refill   acetaminophen  (TYLENOL ) 500 MG tablet Take 1,000 mg by mouth 2 (two) times daily.     albuterol  (VENTOLIN  HFA) 108 (90 Base) MCG/ACT inhaler Inhale 2 puffs into the lungs every 4 (four) hours as needed for shortness of breath or wheezing. 6.7 g 0   ALPRAZolam  (XANAX ) 0.25 MG tablet Take 0.25 mg by mouth at bedtime.     Ascorbic Acid (VITAMIN C) 1000 MG tablet Take 1,000 mg by mouth daily.     aspirin  EC 81 MG tablet Take 81 mg by mouth at bedtime. Swallow whole.     b complex vitamins tablet Take 1 tablet by mouth daily.     buPROPion (WELLBUTRIN) 75 MG tablet 75 mg 2 (two) times daily.     Calcium Carb-Cholecalciferol (CALCIUM 600+D) 600-20 MG-MCG TABS Take 1 tablet by mouth daily.     Cholecalciferol (VITAMIN D3) 50 MCG (2000 UT) capsule Take 2,000 Units by mouth daily.     cyanocobalamin  (VITAMIN B12) 1000 MCG tablet Take 1,000 mcg by mouth daily as needed (energy).     ferrous sulfate 325 (65 FE) MG  tablet Take 325 mg by mouth daily with breakfast.     furosemide  (LASIX ) 20 MG tablet Take 1 tablet (20 mg total) by mouth as needed.     gabapentin  (NEURONTIN ) 100 MG capsule Take 1 capsule (100 mg total) by mouth 2 (two) times daily.     hydrocortisone cream 1 % Apply 1 Application topically 2 (two) times daily as needed (rash).     Multiple Vitamins-Minerals (HAIR SKIN & NAILS) TABS Take 3 tablets by mouth daily.     Multiple Vitamins-Minerals (MULTIVITAMIN WITH MINERALS) tablet Take 1 tablet by mouth daily.     MYRBETRIQ  50 MG TB24 tablet TAKE (1) TABLET BY MOUTH AT BEDTIME. 30 tablet 11   olmesartan (BENICAR)  40 MG tablet Take 40 mg by mouth daily.     pantoprazole  (PROTONIX ) 40 MG tablet Take 40 mg by mouth daily.     Psyllium (METAMUCIL PO) Take 1 Dose by mouth daily as needed (constipation). 1 dose = 1 teaspoonful     sertraline  (ZOLOFT ) 100 MG tablet Take 100 mg by mouth at bedtime.     simvastatin  (ZOCOR ) 10 MG tablet Take 10 mg by mouth at bedtime.     solifenacin  (VESICARE ) 10 MG tablet TAKE (1) TABLET BY MOUTH AT BEDTIME. 30 tablet 11   sulfaSALAzine  (AZULFIDINE ) 500 MG tablet Take 1 tablet (500 mg total) by mouth 2 (two) times daily. 60 tablet 2   traMADol  (ULTRAM ) 50 MG tablet as needed for moderate pain (pain score 4-6).     vitamin E 400 UNIT capsule Take 400 Units by mouth daily.     No current facility-administered medications for this visit.    SURGICAL HISTORY:  Past Surgical History:  Procedure Laterality Date   BOWEL RESECTION  1970   BRONCHIAL BIOPSY  09/01/2022   Procedure: BRONCHIAL BIOPSIES;  Surgeon: Shelah Lamar RAMAN, MD;  Location: [facility] ENDOSCOPY;  Service: Pulmonary;;   BRONCHIAL BRUSHINGS  09/01/2022   Procedure: BRONCHIAL BRUSHINGS;  Surgeon: Shelah Lamar RAMAN, MD;  Location[facility] ENDOSCOPY;  Service: Pulmonary;;   BRONCHIAL NEEDLE ASPIRATION BIOPSY  09/01/2022   Procedure: BRONCHIAL NEEDLE ASPIRATION BIOPSIES;  Surgeon: Shelah Lamar RAMAN, MD;  Location: [facility] ENDOSCOPY;  Service: Pulmonary;;   COLONOSCOPY N/A 12/21/2023   Procedure: COLONOSCOPY;  Surgeon: Cindie Carlin POUR, DO;  Location: [facility] ENDO SUITE;  Service: Endoscopy;  Laterality: N/A;  1:00 pm, asa 3   ESOPHAGEAL DILATION N/A 12/21/2023   Procedure: DILATION, ESOPHAGUS;  Surgeon: Cindie Carlin POUR, DO;  Location: [facility] ENDO SUITE;  Service: Endoscopy;  Laterality: N/A;   ESOPHAGOGASTRODUODENOSCOPY N/A 12/21/2023   Procedure: EGD (ESOPHAGOGASTRODUODENOSCOPY);  Surgeon: Cindie Carlin POUR, DO;  Location: [facility] ENDO SUITE;  Service: Endoscopy;  Laterality: N/A;   EYE SURGERY Bilateral    cataract   FIDUCIAL MARKER PLACEMENT   09/01/2022   Procedure: FIDUCIAL MARKER PLACEMENT;  Surgeon: Shelah Lamar RAMAN, MD;  Location: [facility] ENDOSCOPY;  Service: Pulmonary;;   HIP ARTHROPLASTY Left 06/06/2016   Procedure: ARTHROPLASTY BIPOLAR HIP (HEMIARTHROPLASTY);  Surgeon: Taft FORBES Minerva, MD;  Location: [facility] ORS;  Service: Orthopedics;  Laterality: Left;   INTRAOPERATIVE TRANSTHORACIC ECHOCARDIOGRAM N/A 10/07/2022   Procedure: INTRAOPERATIVE TRANSTHORACIC ECHOCARDIOGRAM;  Surgeon: Verlin Lonni BIRCH, MD;  Location: [facility] INVASIVE CV LAB;  Service: Open Heart Surgery;  Laterality: N/A;   RIGHT HEART CATH AND CORONARY ANGIOGRAPHY N/A 07/07/2022   Procedure: RIGHT HEART CATH AND CORONARY ANGIOGRAPHY;  Surgeon: Verlin Lonni BIRCH, MD;  Location: [facility] INVASIVE CV LAB;  Service: Cardiovascular;  Laterality: N/A;  TRANSCATHETER AORTIC VALVE REPLACEMENT, TRANSFEMORAL Right 10/07/2022   Procedure: Transcatheter Aortic Valve Replacement, Transfemoral;  Surgeon: Verlin Lonni BIRCH, MD;  Location: [facility] INVASIVE CV LAB;  Service: Open Heart Surgery;  Laterality: Right;    REVIEW OF SYSTEMS:  A comprehensive review of systems was negative except for: Constitutional: positive for fatigue Musculoskeletal: positive for arthralgias   PHYSICAL EXAMINATION: General appearance: alert, cooperative, fatigued, and no distress Head: Normocephalic, without obvious abnormality, atraumatic Neck: no adenopathy, no JVD, supple, symmetrical, trachea midline, and thyroid  not enlarged, symmetric, no tenderness/mass/nodules Lymph nodes: Cervical, supraclavicular, and axillary nodes normal. Resp: clear to auscultation bilaterally Back: symmetric, no curvature. ROM normal. No CVA tenderness. Cardio: regular rate and rhythm, S1, S2 normal, no murmur, click, rub or gallop GI: soft, non-tender; bowel sounds normal; no masses,  no organomegaly Extremities: extremities normal, atraumatic, no cyanosis or edema  ECOG PERFORMANCE STATUS: 1 - Symptomatic but  completely ambulatory  Blood pressure (!) 119/54, pulse 74, temperature 97.6 F (36.4 C), temperature source Temporal, resp. rate 17, height 5' 2 (1.575 m), weight 165 lb (74.8 kg), SpO2 95%.  LABORATORY DATA: Lab Results  Component Value Date   WBC 8.7 01/01/2024   HGB 10.8 (L) 01/01/2024   HCT 33.8 (L) 01/01/2024   MCV 92.3 01/01/2024   PLT 145 (L) 01/01/2024      Chemistry      Component Value Date/Time   NA 138 01/01/2024 1423   NA 143 06/23/2022 1201   K 4.6 01/01/2024 1423   CL 103 01/01/2024 1423   CO2 31 01/01/2024 1423   BUN 33 (H) 01/01/2024 1423   BUN 17 06/23/2022 1201   CREATININE 1.08 (H) 01/01/2024 1423      Component Value Date/Time   CALCIUM 9.1 01/01/2024 1423   ALKPHOS 62 01/01/2024 1423   AST 13 (L) 01/01/2024 1423   ALT 7 01/01/2024 1423   BILITOT 0.4 01/01/2024 1423       RADIOGRAPHIC STUDIES: CT Chest W Contrast Result Date: 01/05/2024 EXAM: CT CHEST WITH CONTRAST 01/01/2024 04:29:44 PM TECHNIQUE: CT of the chest was performed with the administration of 100 mL Iohexol  300. Multiplanar reformatted images are provided for review. Automated exposure control, iterative reconstruction, and/or weight based adjustment of the mA/kV was utilized to reduce the radiation dose to as low as reasonably achievable. COMPARISON: CT Chest 06/13/2023. CLINICAL HISTORY: Non-small cell lung cancer, non-metastatic, assess treatment response, carcinoma of upper lobe of right lung. * Tracking Code: BO * FINDINGS: MEDIASTINUM: Heart and pericardium are unremarkable. The central airways are clear. Advanced aortic atherosclerosis. TAVR. 3 vessel coronary artery calcification. No central pulmonary embolism on this non-dedicated exam. LYMPH NODES: No mediastinal, hilar or axillary lymphadenopathy. No supraclavicular adenopathy. LUNGS AND PLEURA: 2 mm left upper lobe pulmonary nodule is similar on image 62 / 8. More anterior left upper lobe 3 to 4 mm pulmonary nodule is similar on  image 35 / 8. New or significantly progressive consolidation centered about and obscuration the posterior right upper lobe pulmonary nodule. surrounding septal thickening and ground glass especially within the posterior right upper lobe, less so superior segment of right lower lobe. No pleural effusion or pneumothorax. Mildly limited secondary to patient body habitus and arm position, not raised above the head. SOFT TISSUES/BONES: . lower thoracic spondylosis. Moderate convex left thoracopulmonary spondylosis. UPPER ABDOMEN: Limited images of the upper abdomen demonstrates no acute abnormality. IMPRESSION: 1. New or significantly progressive posterior right upper and superior segment right lower lobe pulmonary opacities, presumably radiopaque  induced, obscuring the known posterior right upper lobe pulmonary nodule. 2. No findings of thoracic metastatic disease. 3. Tiny nonspecific pulmonary nodules, similar. Recommend attention on follow-up. 4. Mild limitations as detailed above. 5. Aortic atherosclerosis ([id number]-i70.0). Coronary artery atherosclerosis. Electronically signed by: Rockey Kilts MD 01/05/2024 10:54 AM EST RP Workstation: [id number]    ASSESSMENT AND PLAN: This is a very pleasant 83 years old white female with highly suspicious stage IA (T1b, N0, M0) non-small cell lung cancer presented with right upper lobe lung nodule. The biopsy was consistent with atypical cells but not diagnostic for malignancy but her PET scan showed significant hypermetabolic activity in this nodule consistent with lung malignancy.  She is status post SBRT to the right upper lobe lung nodule under the care of Dr. Dewey in January 2025 and currently on observation. She had repeat CT scan of the chest performed recently.  I personally and independently reviewed the scan images and discussed the result with the patient and her daughter.  Her scan showed no concerning findings for disease progression but there was more evolving  radiation changes in the right upper lobe. Assessment and Plan Assessment & Plan Stage I non-small cell lung cancer, post-radiation, under surveillance Stage I non-small cell lung cancer diagnosed in July 2024, treated with curative radiation therapy completed in January 2025. Current CT scan shows increased scarring and density in the radiation area, consistent with radiation effects rather than cancer recurrence. No new nodules or concerning findings on the scan. - Continue surveillance with repeat CT scan in six months She was advised to call immediately if she has any other concerning symptoms in the interval.  The patient voices understanding of current disease status and treatment options and is in agreement with the current care plan.  All questions were answered. The patient knows to call the clinic with any problems, questions or concerns. We can certainly see the patient much sooner if necessary.  The total time spent in the appointment was 20 minutes.  Disclaimer: This note was dictated with voice recognition software. Similar sounding words can inadvertently be transcribed and may not be corrected upon review.

## 2024-01-29 ENCOUNTER — Telehealth: Payer: Self-pay | Admitting: Internal Medicine

## 2024-01-29 NOTE — Telephone Encounter (Signed)
 Scheduled patient an earlier appt per harlene in a staff message regarding a referral. Called and spoke with the patient and her daughter, they are aware of the day and time of appointments

## 2024-02-15 ENCOUNTER — Ambulatory Visit: Admitting: Obstetrics & Gynecology

## 2024-02-15 ENCOUNTER — Encounter: Payer: Self-pay | Admitting: Obstetrics & Gynecology

## 2024-02-15 VITALS — BP 126/76 | HR 89 | Ht 62.0 in | Wt 166.0 lb

## 2024-02-15 DIAGNOSIS — N3281 Overactive bladder: Secondary | ICD-10-CM | POA: Diagnosis not present

## 2024-02-15 DIAGNOSIS — Z4689 Encounter for fitting and adjustment of other specified devices: Secondary | ICD-10-CM | POA: Diagnosis not present

## 2024-02-15 DIAGNOSIS — N813 Complete uterovaginal prolapse: Secondary | ICD-10-CM

## 2024-02-15 NOTE — Progress Notes (Signed)
 Chief Complaint  Patient presents with   vaginal bleeding    Happened 1 day and none since then    Blood pressure 126/76, pulse 89, height 5' 2 (1.575 m), weight 166 lb (75.3 kg).  Barbara Lucero presents today for routine follow up related to her pessary.   She uses a Milex ring with support #5 She reports no vaginal discharge and no vaginal bleeding   Likert scale(1 not bothersome -5 very bothersome)  :  1  Exam reveals no undue vaginal mucosal pressure of breakdown, no discharge and no vaginal bleeding.  Vaginal Epithelial Abnormality Classification System:   0 0    No abnormalities 1    Epithelial erythema 2    Granulation tissue 3    Epithelial break or erosion, 1 cm or less 4    Epithelial break or erosion, 1 cm or greater  The pessary is removed, cleaned and replaced without difficulty.      ICD-10-CM   1. 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 OAB (overactive bladder): chronic + suboptimally managed  N32.81        Barbara Lucero will be sen back in 4 months for continued follow up.  Barbara VEAR Inch, MD  02/15/2024 3:18 PM

## 2024-02-23 ENCOUNTER — Telehealth: Payer: Self-pay | Admitting: Internal Medicine

## 2024-02-23 NOTE — Telephone Encounter (Signed)
 The patients daughter left me voicemail asking to reschedule tomorrows appointments to the next available after that day.  I left the patients daughter a voicemail informing her that the appointments have been moved off of tomorrows schedule and now to 1/07.

## 2024-02-24 ENCOUNTER — Inpatient Hospital Stay: Admitting: Internal Medicine

## 2024-02-24 ENCOUNTER — Encounter: Payer: Self-pay | Admitting: Gastroenterology

## 2024-02-24 ENCOUNTER — Inpatient Hospital Stay: Attending: Internal Medicine

## 2024-03-02 ENCOUNTER — Inpatient Hospital Stay

## 2024-03-02 ENCOUNTER — Inpatient Hospital Stay: Admitting: Internal Medicine

## 2024-03-22 ENCOUNTER — Telehealth: Payer: Self-pay | Admitting: Medical Oncology

## 2024-03-22 ENCOUNTER — Inpatient Hospital Stay

## 2024-03-22 ENCOUNTER — Other Ambulatory Visit: Payer: Self-pay | Admitting: Medical Oncology

## 2024-03-22 ENCOUNTER — Inpatient Hospital Stay: Admitting: Internal Medicine

## 2024-03-22 NOTE — Telephone Encounter (Signed)
 Called dtr re appt today  . She cancelled her appt due to weather.  Today  was a r/s from 12/31 which pt cancelled.   Schedule message sent to r/s pt for labs and [facility].   Renee  said in  [place]'s nephrologist referred her back to Dr. Sherrod in [facility]  for an abnormal protein . She has not seen her for that problem.

## 2024-03-22 NOTE — Progress Notes (Signed)
Lab order date changed.

## 2024-03-23 ENCOUNTER — Telehealth: Payer: Self-pay | Admitting: Medical Oncology

## 2024-03-23 ENCOUNTER — Other Ambulatory Visit: Payer: Self-pay | Admitting: Medical Oncology

## 2024-03-23 DIAGNOSIS — D472 Monoclonal gammopathy: Secondary | ICD-10-CM

## 2024-03-23 NOTE — Telephone Encounter (Signed)
 LVM for Renee to return my call to r/s lab and f/u appt. I have availability tomorrow and then again on 2/4.

## 2024-04-18 ENCOUNTER — Inpatient Hospital Stay: Attending: Internal Medicine

## 2024-04-18 ENCOUNTER — Inpatient Hospital Stay: Admitting: Internal Medicine

## 2024-06-28 ENCOUNTER — Inpatient Hospital Stay: Attending: Internal Medicine

## 2024-07-07 ENCOUNTER — Inpatient Hospital Stay: Admitting: Internal Medicine
